# Patient Record
Sex: Male | Born: 1980 | Race: Black or African American | Hispanic: No | Marital: Married | Smoking: Never smoker
Health system: Southern US, Community
[De-identification: ages and names within clinical notes are randomized; demographics above are authoritative.]

## PROBLEM LIST (undated history)

## (undated) DIAGNOSIS — F209 Schizophrenia, unspecified: Secondary | ICD-10-CM

## (undated) DIAGNOSIS — R569 Unspecified convulsions: Secondary | ICD-10-CM

## (undated) DIAGNOSIS — G8929 Other chronic pain: Secondary | ICD-10-CM

## (undated) DIAGNOSIS — F32A Depression, unspecified: Secondary | ICD-10-CM

## (undated) DIAGNOSIS — R42 Dizziness and giddiness: Secondary | ICD-10-CM

## (undated) DIAGNOSIS — F319 Bipolar disorder, unspecified: Secondary | ICD-10-CM

## (undated) DIAGNOSIS — F419 Anxiety disorder, unspecified: Secondary | ICD-10-CM

## (undated) DIAGNOSIS — G4700 Insomnia, unspecified: Secondary | ICD-10-CM

## (undated) DIAGNOSIS — R519 Headache, unspecified: Secondary | ICD-10-CM

## (undated) DIAGNOSIS — Z9289 Personal history of other medical treatment: Secondary | ICD-10-CM

## (undated) DIAGNOSIS — G3184 Mild cognitive impairment, so stated: Secondary | ICD-10-CM

## (undated) DIAGNOSIS — R51 Headache: Secondary | ICD-10-CM

## (undated) DIAGNOSIS — F329 Major depressive disorder, single episode, unspecified: Secondary | ICD-10-CM

## (undated) DIAGNOSIS — F445 Conversion disorder with seizures or convulsions: Secondary | ICD-10-CM

## (undated) HISTORY — DX: Unspecified convulsions: R56.9

## (undated) HISTORY — DX: Insomnia, unspecified: G47.00

## (undated) HISTORY — DX: Anxiety disorder, unspecified: F41.9

## (undated) HISTORY — DX: Schizophrenia, unspecified: F20.9

## (undated) HISTORY — DX: Dizziness and giddiness: R42

---
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
## 2001-12-22 ENCOUNTER — Emergency Department (HOSPITAL_COMMUNITY): Admission: EM | Admit: 2001-12-22 | Discharge: 2001-12-22 | Payer: Self-pay | Admitting: Emergency Medicine

## 2002-01-02 ENCOUNTER — Emergency Department (HOSPITAL_COMMUNITY): Admission: EM | Admit: 2002-01-02 | Discharge: 2002-01-02 | Payer: Self-pay | Admitting: *Deleted

## 2003-02-17 ENCOUNTER — Emergency Department (HOSPITAL_COMMUNITY): Admission: EM | Admit: 2003-02-17 | Discharge: 2003-02-17 | Payer: Self-pay | Admitting: Emergency Medicine

## 2004-11-12 ENCOUNTER — Encounter: Admission: RE | Admit: 2004-11-12 | Discharge: 2004-11-12 | Payer: Self-pay | Admitting: Internal Medicine

## 2006-06-19 ENCOUNTER — Emergency Department (HOSPITAL_COMMUNITY): Admission: EM | Admit: 2006-06-19 | Discharge: 2006-06-19 | Payer: Self-pay | Admitting: Emergency Medicine

## 2006-06-22 ENCOUNTER — Ambulatory Visit (HOSPITAL_COMMUNITY): Admission: RE | Admit: 2006-06-22 | Discharge: 2006-06-22 | Payer: Self-pay | Admitting: Preventative Medicine

## 2007-04-04 ENCOUNTER — Emergency Department (HOSPITAL_COMMUNITY): Admission: EM | Admit: 2007-04-04 | Discharge: 2007-04-04 | Payer: Self-pay | Admitting: Emergency Medicine

## 2008-10-18 ENCOUNTER — Emergency Department (HOSPITAL_COMMUNITY): Admission: EM | Admit: 2008-10-18 | Discharge: 2008-10-18 | Payer: Self-pay | Admitting: Emergency Medicine

## 2010-06-21 ENCOUNTER — Encounter: Payer: Self-pay | Admitting: Internal Medicine

## 2010-08-12 ENCOUNTER — Emergency Department (HOSPITAL_COMMUNITY): Payer: Medicaid Other

## 2010-08-12 ENCOUNTER — Emergency Department (HOSPITAL_COMMUNITY)
Admission: EM | Admit: 2010-08-12 | Discharge: 2010-08-13 | Disposition: A | Discharge status: Home / Self Care | Payer: Medicaid Other | Attending: Emergency Medicine | Admitting: Emergency Medicine

## 2010-08-12 DIAGNOSIS — R05 Cough: Secondary | ICD-10-CM | POA: Insufficient documentation

## 2010-08-12 DIAGNOSIS — R059 Cough, unspecified: Secondary | ICD-10-CM | POA: Insufficient documentation

## 2010-08-12 DIAGNOSIS — J45909 Unspecified asthma, uncomplicated: Secondary | ICD-10-CM | POA: Insufficient documentation

## 2010-08-12 DIAGNOSIS — F3289 Other specified depressive episodes: Secondary | ICD-10-CM | POA: Insufficient documentation

## 2010-08-12 DIAGNOSIS — F329 Major depressive disorder, single episode, unspecified: Secondary | ICD-10-CM | POA: Insufficient documentation

## 2010-08-12 DIAGNOSIS — R071 Chest pain on breathing: Secondary | ICD-10-CM | POA: Insufficient documentation

## 2010-08-13 LAB — CBC
HCT: 42 % (ref 39.0–52.0)
Hemoglobin: 14.9 g/dL (ref 13.0–17.0)
MCHC: 35.5 g/dL (ref 30.0–36.0)
MCV: 86.2 fL (ref 78.0–100.0)
Platelets: 165 10*3/uL (ref 150–400)
RBC: 4.87 MIL/uL (ref 4.22–5.81)
RDW: 12.5 % (ref 11.5–15.5)

## 2010-08-13 LAB — DIFFERENTIAL
Basophils Relative: 1 % (ref 0–1)
Monocytes Absolute: 0.6 10*3/uL (ref 0.1–1.0)
Neutro Abs: 4.9 10*3/uL (ref 1.7–7.7)

## 2010-08-13 LAB — BASIC METABOLIC PANEL
BUN: 13 mg/dL (ref 6–23)
Chloride: 106 mEq/L (ref 96–112)
Glucose, Bld: 106 mg/dL — ABNORMAL HIGH (ref 70–99)

## 2010-08-16 ENCOUNTER — Emergency Department (HOSPITAL_COMMUNITY): Payer: Medicaid Other

## 2010-08-16 ENCOUNTER — Emergency Department (HOSPITAL_COMMUNITY)
Admission: EM | Admit: 2010-08-16 | Discharge: 2010-08-16 | Disposition: A | Discharge status: Home / Self Care | Payer: Medicaid Other | Attending: Emergency Medicine | Admitting: Emergency Medicine

## 2010-08-16 DIAGNOSIS — R059 Cough, unspecified: Secondary | ICD-10-CM | POA: Insufficient documentation

## 2010-08-16 DIAGNOSIS — J45909 Unspecified asthma, uncomplicated: Secondary | ICD-10-CM | POA: Insufficient documentation

## 2010-08-16 DIAGNOSIS — R05 Cough: Secondary | ICD-10-CM | POA: Insufficient documentation

## 2010-09-14 ENCOUNTER — Ambulatory Visit (INDEPENDENT_AMBULATORY_CARE_PROVIDER_SITE_OTHER): Payer: Medicaid Other

## 2010-09-14 ENCOUNTER — Inpatient Hospital Stay (INDEPENDENT_AMBULATORY_CARE_PROVIDER_SITE_OTHER)
Admission: RE | Admit: 2010-09-14 | Discharge: 2010-09-14 | Disposition: A | Discharge status: Home / Self Care | Payer: Medicaid Other | Source: Ambulatory Visit | Attending: Emergency Medicine | Admitting: Emergency Medicine

## 2010-09-14 DIAGNOSIS — S20229A Contusion of unspecified back wall of thorax, initial encounter: Secondary | ICD-10-CM

## 2011-07-21 ENCOUNTER — Encounter (HOSPITAL_COMMUNITY): Payer: Self-pay | Admitting: Emergency Medicine

## 2011-07-21 ENCOUNTER — Emergency Department (HOSPITAL_COMMUNITY)
Admission: EM | Admit: 2011-07-21 | Discharge: 2011-07-21 | Disposition: A | Discharge status: Home / Self Care | Payer: No Typology Code available for payment source | Attending: Emergency Medicine | Admitting: Emergency Medicine

## 2011-07-21 DIAGNOSIS — M549 Dorsalgia, unspecified: Secondary | ICD-10-CM | POA: Insufficient documentation

## 2011-07-21 DIAGNOSIS — Y92410 Unspecified street and highway as the place of occurrence of the external cause: Secondary | ICD-10-CM | POA: Insufficient documentation

## 2011-07-21 MED ORDER — IBUPROFEN 600 MG PO TABS: 600.0000 mg | ORAL_TABLET | Freq: Four times a day (QID) | ORAL | Status: AC | PRN

## 2011-07-21 MED ORDER — DIAZEPAM 5 MG PO TABS: 5.0000 mg | ORAL_TABLET | Freq: Two times a day (BID) | ORAL | Status: DC

## 2011-07-21 MED ORDER — DIAZEPAM 2 MG PO TABS: 2.0000 mg | ORAL_TABLET | Freq: Three times a day (TID) | ORAL | Status: AC | PRN
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
## 2011-07-21 NOTE — ED Provider Notes (Signed)
Medical screening examination/treatment/procedure(s) were performed by non-physician practitioner and as supervising physician I was immediately available for consultation/collaboration. Devoria Albe, MD, Armando Gang   Ward Givens, MD 07/21/11 [phone]
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
## 2011-07-21 NOTE — ED Notes (Signed)
Verbalzed understanding instructions

## 2011-08-17 ENCOUNTER — Encounter (HOSPITAL_COMMUNITY): Payer: Self-pay

## 2011-08-17 ENCOUNTER — Emergency Department (HOSPITAL_COMMUNITY)
Admission: EM | Admit: 2011-08-17 | Discharge: 2011-08-17 | Discharge status: Against Medical Advice | Payer: Medicaid Other | Attending: Emergency Medicine | Admitting: Emergency Medicine

## 2011-08-17 DIAGNOSIS — Z0389 Encounter for observation for other suspected diseases and conditions ruled out: Secondary | ICD-10-CM | POA: Insufficient documentation

## 2011-08-17 HISTORY — DX: Major depressive disorder, single episode, unspecified: F32.9

## 2011-08-17 HISTORY — DX: Depression, unspecified: F32.A

## 2011-08-17 MED ORDER — SODIUM CHLORIDE 0.9 % IV SOLN: INTRAVENOUS | Status: DC
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
## 2011-08-18 ENCOUNTER — Emergency Department (HOSPITAL_COMMUNITY)
Admission: EM | Admit: 2011-08-18 | Discharge: 2011-08-18 | Disposition: A | Discharge status: Home / Self Care | Payer: Medicaid Other | Attending: Emergency Medicine | Admitting: Emergency Medicine

## 2011-08-18 ENCOUNTER — Encounter (HOSPITAL_COMMUNITY): Payer: Self-pay | Admitting: *Deleted

## 2011-08-18 DIAGNOSIS — F329 Major depressive disorder, single episode, unspecified: Secondary | ICD-10-CM

## 2011-08-18 DIAGNOSIS — Z79899 Other long term (current) drug therapy: Secondary | ICD-10-CM | POA: Insufficient documentation

## 2011-08-18 DIAGNOSIS — F3289 Other specified depressive episodes: Secondary | ICD-10-CM | POA: Insufficient documentation

## 2011-08-18 DIAGNOSIS — F32A Depression, unspecified: Secondary | ICD-10-CM

## 2011-08-18 DIAGNOSIS — R45851 Suicidal ideations: Secondary | ICD-10-CM | POA: Insufficient documentation

## 2011-08-18 LAB — RAPID URINE DRUG SCREEN, HOSP PERFORMED
Amphetamines: NOT DETECTED
Benzodiazepines: NOT DETECTED
Cocaine: NOT DETECTED
Opiates: NOT DETECTED
Tetrahydrocannabinol: NOT DETECTED

## 2011-08-18 LAB — CBC
HCT: 45.3 % (ref 39.0–52.0)
Hemoglobin: 16 g/dL (ref 13.0–17.0)
MCH: 30.2 pg (ref 26.0–34.0)
MCHC: 35.3 g/dL (ref 30.0–36.0)
RDW: 12.2 % (ref 11.5–15.5)

## 2011-08-18 LAB — DIFFERENTIAL
Basophils Absolute: 0 10*3/uL (ref 0.0–0.1)
Basophils Relative: 1 % (ref 0–1)
Eosinophils Absolute: 0 10*3/uL (ref 0.0–0.7)
Monocytes Absolute: 0.4 10*3/uL (ref 0.1–1.0)
Monocytes Relative: 7 % (ref 3–12)
Neutro Abs: 3.9 10*3/uL (ref 1.7–7.7)

## 2011-08-18 LAB — COMPREHENSIVE METABOLIC PANEL
Albumin: 4.3 g/dL (ref 3.5–5.2)
BUN: 11 mg/dL (ref 6–23)
Calcium: 9.5 mg/dL (ref 8.4–10.5)
Creatinine, Ser: 0.79 mg/dL (ref 0.50–1.35)
GFR calc Af Amer: >90 mL/min (ref >90)
Glucose, Bld: 94 mg/dL (ref 70–99)
Potassium: 3.9 mEq/L (ref 3.5–5.1)
Total Protein: 7.7 g/dL (ref 6.0–8.3)

## 2011-08-18 LAB — SALICYLATE LEVEL: Salicylate Lvl: <2 mg/dL — ABNORMAL LOW (ref 2.8–20.0)
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
## 2011-08-18 NOTE — ED Notes (Signed)
Also the ACT team is made aware and they spoke with Dr. Juleen China and he will DC him

## 2011-08-18 NOTE — ED Notes (Signed)
Pt has 2 belonging bags 

## 2011-08-18 NOTE — ED Provider Notes (Signed)
History     CSN: [id number]  Arrival date & time 08/18/11  1013   First MD Initiated Contact with Patient 08/18/11 1047      Chief Complaint  Patient presents with  . Medical Clearance  . Suicidal    (Consider location/radiation/quality/duration/timing/severity/associated sxs/prior treatment) HPI Pt here with roommate and is not answering questions verbally. He responds by shaking his head. Pt roommate states that pt told him that he took 4 150mg  Zoloft tabs last night. Denied coingestion. Pt is not currently suicidal. Denies pain.  Past Medical History  Diagnosis Date  . Depression     History reviewed. No pertinent past surgical history.  No family history on file.  History  Substance Use Topics  . Smoking status: Never Smoker   . Smokeless tobacco: Not on file  . Alcohol Use: No      Review of Systems  Constitutional: Negative for fever and chills.  Respiratory: Negative for shortness of breath.   Cardiovascular: Negative for chest pain.  Gastrointestinal: Negative for vomiting, abdominal pain and diarrhea.  Neurological: Negative for weakness, numbness and headaches.    Allergies  Penicillins  Home Medications   Current Outpatient Rx  Name Route Sig Dispense Refill  . DIVALPROEX SODIUM ER 500 MG PO TB24 Oral Take 500 mg by mouth at bedtime.    . SERTRALINE HCL 100 MG PO TABS Oral Take 150 mg by mouth daily. TAKE 1.5 TABLETS BY MOUTH ONCE DAILY      BP 124/76  Pulse 76  Temp(Src) 98.4 F (36.9 C) (Oral)  Resp 20  Ht 6' (1.829 m)  Wt 154 lb (69.854 kg)  BMI 20.89 kg/m2  SpO2 96%  Physical Exam  Nursing note and vitals reviewed. Constitutional: He is oriented to person, place, and time. He appears well-developed and well-nourished. No distress.  HENT:  Head: Normocephalic and atraumatic.  Mouth/Throat: Oropharynx is clear and moist.  Eyes: EOM are normal. Pupils are equal, round, and reactive to light.  Neck: Normal range of motion. Neck  supple.  Cardiovascular: Normal rate and regular rhythm.   Pulmonary/Chest: Effort normal and breath sounds normal. No respiratory distress. He has no wheezes. He has no rales.  Abdominal: Soft. Bowel sounds are normal. He exhibits no mass. There is no tenderness. There is no rebound and no guarding.  Musculoskeletal: Normal range of motion. He exhibits no edema and no tenderness.  Neurological: He is alert and oriented to person, place, and time.       5/5 motor, sensation intact  Skin: Skin is warm and dry. No rash noted. No erythema.    ED Course  Procedures (including critical care time)  Labs Reviewed  COMPREHENSIVE METABOLIC PANEL - Abnormal; Notable for the following:    Total Bilirubin 1.3 (*)    All other components within normal limits  SALICYLATE LEVEL - Abnormal; Notable for the following:    Salicylate Lvl <2.0 (*)    All other components within normal limits  CBC  DIFFERENTIAL  ETHANOL  URINE RAPID DRUG SCREEN (HOSP PERFORMED)  ACETAMINOPHEN LEVEL  LAB REPORT - SCANNED   No results found.   1. Depression       MDM          Loren Racer, MD 08/23/11 [phone]
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
## 2011-08-18 NOTE — ED Notes (Signed)
See notes from visit last pm.

## 2011-08-18 NOTE — Discharge Instructions (Signed)
Depression You have signs of depression. This is a common problem. It can occur at any age. It is often hard to recognize. People can suffer from depression and still have moments of enjoyment. Depression interferes with your basic ability to function in life. It upsets your relationships, sleep, eating, and work habits. CAUSES  Depression is believed to be caused by an imbalance in brain chemicals. It may be triggered by an unpleasant event. Relationship crises, a death in the family, financial worries, retirement, or other stressors are normal causes of depression. Depression may also start for no known reason. Other factors that may play a part include medical illnesses, some medicines, genetics, and alcohol or drug abuse. SYMPTOMS   Feeling unhappy or worthless.   Long-lasting (chronic) tiredness or worn-out feeling.   Self-destructive thoughts and actions.   Not being able to sleep or sleeping too much.   Eating more than usual or not eating at all.   Headaches or feeling anxious.   Trouble concentrating or making decisions.   Unexplained physical problems and substance abuse.  TREATMENT  Depression usually gets better with treatment. This can include:  Antidepressant medicines. It can take weeks before the proper dose is achieved and benefits are reached.   Talking with a therapist, clergyperson, counselor, or friend. These people can help you gain insight into your problem and regain control of your life.   Eating a good diet.   Getting regular physical exercise, such as walking for 30 minutes every day.   Not abusing alcohol or drugs.  Treating depression often takes 6 months or longer. This length of treatment is needed to keep symptoms from returning. Call your caregiver and arrange for follow-up care as suggested. SEEK IMMEDIATE MEDICAL CARE IF:   You start to have thoughts of hurting yourself or others.   Call your local emergency services (911 in [place]   Go to  your local medical emergency department.   Call the National Suicide Prevention Lifeline: 1-800-273-TALK 317-021-8915).  Document Released: 05/17/2005 Document Revised: 05/06/2011 Document Reviewed: 10/17/2009 [facility] Patient Information 2012 [place], [place].Suicidal Feelings, How to Help Yourself Everyone feels sad or unhappy at times, but depressing thoughts and feelings of hopelessness can lead to thoughts of suicide. It can seem as if life is too tough to handle. It is as if the mountain is just too high and your climbing skills are not great enough. At that moment these dark thoughts and feelings may seem overwhelming and never ending. It is important to remember these feelings are temporary! They will go away. If you feel as though you have reached the point where suicide is the only answer, it is time to let someone know immediately. This is the first step to feeling better. The following steps will move you to safer ground and lead you in a positive direction out of depression. HOW TO COPE AND PREVENT SUICIDE  Let family, friends, teachers and/or counselors know. Get help. Try not to isolate yourself from those who care about you. Even though you may not feel sociable or think that you are not good company, talk with someone everyday. It is best if it is face to face. Remember, they will want to help you.   Eat a regularly spaced and well-balanced diet, and get plenty of rest.   Avoid alcohol and drugs because they will only make you feel worse and may also lower your inhibitions. Remove them from the home. If you are thinking of taking an overdose of  your prescribed medications, give your medicines to someone who can give them to you one day at a time. If you are on antidepressants, let your caregiver know of your feelings so he or she can provide a safer medication, if that is a concern.   Remove weapons or poisons from your home.   Try to stick to routines. That may mean just walking  the dog or feeding the cat. Follow a schedule and remind yourself that you have to keep that schedule every day. Play with your pets. If it is possible, and you do not have a pet, get one. They give you a sense of well-being, lower your blood pressure and make your heart feel good. They need you, and we all want to be needed.   Set some realistic goals and achieve them. Make a list and cross things off as you go. Accomplishments give a sense of worth. Wait until you are feeling better before doing things you find difficult or unpleasant to do.   If you are able, try to start exercising. Even half-hour periods of exercise each day will make you feel better. Getting out in the sun or into nature helps you recover from depression faster. If you have a favorite place to walk, take advantage of that.   Increase safe activities that have always given you pleasure. This may include playing your favorite music, reading a good book, painting a picture or playing your favorite instrument. Do whatever takes your mind off your depression and puts a smile on your face.   Keep your living space well lit with windows open, and let the sun shine in. Bright light definitely treats depression, not just people with the seasonal affective disorders (SAD).  Above all else remember, depression is temporary. It will go away. Do not contemplate suicide. Death as a permanent solution is not the answer. Suicide will take away the beautiful rest of life, and do lifelong harm to those around you who love you. Help is available. National Suicide Help Lines with 24 hour help are: 1-800-SUICIDE (380)402-2001 Document Released: 11/21/2002 Document Revised: 05/06/2011 Document Reviewed: 04/11/2007 [facility] Patient Information 2012 [place], [place].  RESOURCE GUIDE  Dental Problems  Patients with Medicaid: [facility] [phone] [address]                                            [phone] [address] Phone:  [phone]                                                  Phone:  [phone]) [phone]  If unable to pay or uninsured, contact:  [facility] or [facility]. to become qualified for the adult dental clinic.  Chronic Pain Problems Contact [facility]  [phone] Patients need to be referred by their primary care doctor.  Insufficient Money for Medicine Contact [employer] Way:  call "211" or [facility] (760)[phone].  No Primary Care Doctor Call [facility]  [phone] Other agencies that provide inexpensive medical care    [facility]  [phone]  [facility]  [phone]    [facility]  [phone]    [facility]  [phone]    [facility]  [phone]    [facility]  [phone]  Psychological Services [facility]  [phone] [facility]  [phone] [facility]   [phone] (emergency services (516)636-5336)  Substance Abuse Resources [facility]  [phone] [facility] [phone] [facility] ([phone] Floydene Flock [phone] Residential & Outpatient Substance Abuse Program  [phone]  Abuse/Neglect [facility] Child Abuse Hotline 308-320-2660 [facility] Child Abuse Hotline 6403019897 (After Hours)  Emergency [facility] ([phone]  M[facility] [place] [facility] [phone] [facility] (850) [phone]  MRSA Hotline #:   (484)114-4034    [facility] Resources  [facility]     [employer]                          [facility] [address] [place]                       [address]      [address]  Blondell Reveal Phone:  [phone]                                   Phone:   [phone]                 Phone:  [phone]  [facility] Phone:  [phone]  [facility] Child Abuse Hotline 501-026-2768 8484622297 (After Hours)

## 2011-08-18 NOTE — ED Notes (Signed)
Pt is not very verbal with nurse pt states that is depressed but will state what he is depressed about

## 2011-08-18 NOTE — Progress Notes (Signed)
Psych ED Behavioral Health Group  Co-facilitated crisis intervention group w/ Burnis Kingfisher, Chaplain, for pt's in the psych ED. Group focused on suicide ideation and dealing w/ stressors and feeling emotionally overwhelmed. Group was open and engaged w/ each member openly sharing and providing universality/support. Members engaged in drawing activity to promote recognition of bodily reactions to stressors and mindfulness activity to practice belly breathing by imagining inflation/deflation of balloon in the belly.  Pt was active and engaged in group. Pt reported he was in psych ED d/t overdose SI attempt. Pt engaged in activity and reported feeling nerves in chest and stomach. Pt was supportive of other members who discussed SI.  Jas Betten B MS, LPCA, NCC

## 2011-08-18 NOTE — ED Notes (Signed)
Pt had telepysh conference with the pshy MD and she stated she was going to let him go her name is Dr. Berlin Hun.  This nurse stayed in the room during the conference

## 2011-08-18 NOTE — BH Assessment (Signed)
Writer spoke with Lurena Joiner at [facility] [phone].  where pt receives med management. Ms Mayford Knife had accompanied pt to [facility] this am. Lonni Fix reports that pt told her he'd taken the 4 Zoloft pills because he was "depressed", however he wouldn't tell her why he was depressed. She stated that pt didn't tell her or EDP much info while Andreana was here. She reports that she isn't aware of any previous suicide attempts by pt.

## 2011-09-22 ENCOUNTER — Encounter (HOSPITAL_COMMUNITY): Payer: Self-pay | Admitting: Emergency Medicine

## 2011-09-22 ENCOUNTER — Emergency Department (HOSPITAL_COMMUNITY)
Admission: EM | Admit: 2011-09-22 | Discharge: 2011-09-22 | Disposition: A | Discharge status: Home / Self Care | Payer: Medicaid Other | Attending: Emergency Medicine | Admitting: Emergency Medicine

## 2011-09-22 DIAGNOSIS — Z79899 Other long term (current) drug therapy: Secondary | ICD-10-CM | POA: Insufficient documentation

## 2011-09-22 DIAGNOSIS — F3289 Other specified depressive episodes: Secondary | ICD-10-CM | POA: Insufficient documentation

## 2011-09-22 DIAGNOSIS — R05 Cough: Secondary | ICD-10-CM | POA: Insufficient documentation

## 2011-09-22 DIAGNOSIS — J029 Acute pharyngitis, unspecified: Secondary | ICD-10-CM

## 2011-09-22 DIAGNOSIS — R070 Pain in throat: Secondary | ICD-10-CM | POA: Insufficient documentation

## 2011-09-22 DIAGNOSIS — R059 Cough, unspecified: Secondary | ICD-10-CM | POA: Insufficient documentation

## 2011-09-22 DIAGNOSIS — F329 Major depressive disorder, single episode, unspecified: Secondary | ICD-10-CM | POA: Insufficient documentation

## 2011-09-22 NOTE — ED Provider Notes (Signed)
History     CSN: [id number]  Arrival date & time 09/22/11  0945   None     Chief Complaint  Patient presents with  . Sore Throat    for 4 days with a slight cough, no ear pain, no runny nose     (Consider location/radiation/quality/duration/timing/severity/associated sxs/prior treatment) Patient is a 31 y.o. male presenting with pharyngitis. The history is provided by the patient. No language interpreter was used.  Sore Throat This is a new problem. The current episode started in the past 7 days. The problem occurs daily. The problem has been gradually worsening. Associated symptoms include coughing and a sore throat. Pertinent negatives include no abdominal pain, chills, congestion, nausea, neck pain or vomiting. The symptoms are aggravated by swallowing. He has tried nothing for the symptoms.  c/o Sore throat for 3 days with no upper respiratory symptoms except a dry cough. Has taken nothing over the counter.  No fever.  No difficulty swallowing.  Will check rapid strep. Lungs clear.  pmh of depression and behavior health admission  Past Medical History  Diagnosis Date  . Depression     History reviewed. No pertinent past surgical history.  No family history on file.  History  Substance Use Topics  . Smoking status: Never Smoker   . Smokeless tobacco: Not on file  . Alcohol Use: No      Review of Systems  Constitutional: Negative.  Negative for chills.  HENT: Positive for sore throat. Negative for ear pain, nosebleeds, congestion, facial swelling, rhinorrhea, trouble swallowing, neck pain, neck stiffness and voice change.   Eyes: Negative.   Respiratory: Positive for cough. Negative for chest tightness, shortness of breath and wheezing.   Cardiovascular: Negative.   Gastrointestinal: Negative.  Negative for nausea, vomiting and abdominal pain.  Neurological: Negative.   Psychiatric/Behavioral: Negative.   All other systems reviewed and are negative.    Allergies    Penicillins  Home Medications   Current Outpatient Rx  Name Route Sig Dispense Refill  . DIVALPROEX SODIUM ER 500 MG PO TB24 Oral Take 1,000 mg by mouth at bedtime.    . SERTRALINE HCL 100 MG PO TABS Oral Take 150 mg by mouth daily.      BP 127/79  Pulse 93  Temp(Src) 98.8 F (37.1 C) (Oral)  Resp 16  SpO2 100%  Physical Exam  Nursing note and vitals reviewed. Constitutional: He is oriented to person, place, and time. He appears well-developed and well-nourished.  HENT:  Head: Normocephalic.  Right Ear: Tympanic membrane normal.  Left Ear: Tympanic membrane normal.  Nose: Nose normal.  Mouth/Throat: Uvula is midline and mucous membranes are normal. Posterior oropharyngeal erythema present. No oropharyngeal exudate, posterior oropharyngeal edema or tonsillar abscesses.  Eyes: Conjunctivae and EOM are normal. Pupils are equal, round, and reactive to light.  Neck: Normal range of motion. Neck supple.  Cardiovascular: Normal rate and regular rhythm.   Pulmonary/Chest: Effort normal and breath sounds normal.  Abdominal: Soft.  Musculoskeletal: Normal range of motion.  Neurological: He is alert and oriented to person, place, and time.  Skin: Skin is warm and dry. No rash noted.  Psychiatric: He has a normal mood and affect.    ED Course  Procedures (including critical care time)  Labs Reviewed - No data to display No results found.   No diagnosis found.    MDM  Recommend claritin or benadryl for symptoms.  Warm saline gargle and ibuprofen or tylenol.  Upper respiratory virus.  Strep test negative.        Remi Haggard, NP 09/22/11 1706

## 2011-09-22 NOTE — Discharge Instructions (Signed)
Franklin [name] you do not have strep throat.  You have an upper respiratory virus or a cold.  Use warm salt water gargle for the pain. He can also take ibuprofen up to 800 mg every 6 hours for the pain. He can take Benadryl at night to help you sleep and Claritin during the day for your upper respiratory symptoms. PCP from loose to followup with if not better. Return to the ER for uncontrolled nausea vomiting diarrhea or high fever.  Pharyngitis, Viral and Bacterial Pharyngitis is soreness (inflammation) or infection of the pharynx. It is also called a sore throat. CAUSES  Most sore throats are caused by viruses and are part of a cold. However, some sore throats are caused by strep and other bacteria. Sore throats can also be caused by post nasal drip from draining sinuses, allergies and sometimes from sleeping with an open mouth. Infectious sore throats can be spread from person to person by coughing, sneezing and sharing cups or eating utensils. TREATMENT  Sore throats that are viral usually last 3-4 days. Viral illness will get better without medications (antibiotics). Strep throat and other bacterial infections will usually begin to get better about 24-48 hours after you begin to take antibiotics. HOME CARE INSTRUCTIONS   If the caregiver feels there is a bacterial infection or if there is a positive strep test, they will prescribe an antibiotic. The full course of antibiotics must be taken. If the full course of antibiotic is not taken, you or your child may become ill again. If you or your child has strep throat and do not finish all of the medication, serious heart or kidney diseases may develop.   Drink enough water and fluids to keep your urine clear or pale yellow.   Only take over-the-counter or prescription medicines for pain, discomfort or fever as directed by your caregiver.   Get lots of rest.   Gargle with salt water ( tsp. of salt in a glass of water) as often as every 1-2 hours as you  need for comfort.   Hard candies may soothe the throat if individual is not at risk for choking. Throat sprays or lozenges may also be used.  SEEK MEDICAL CARE IF:   Large, tender lumps in the neck develop.   A rash develops.   Green, yellow-brown or bloody sputum is coughed up.   Your baby is older than 3 months with a rectal temperature of 100.5 F (38.1 C) or higher for more than 1 day.  SEEK IMMEDIATE MEDICAL CARE IF:   A stiff neck develops.   You or your child are drooling or unable to swallow liquids.   You or your child are vomiting, unable to keep medications or liquids down.   You or your child has severe pain, unrelieved with recommended medications.   You or your child are having difficulty breathing (not due to stuffy nose).   You or your child are unable to fully open your mouth.   You or your child develop redness, swelling, or severe pain anywhere on the neck.   You have a fever.   Your baby is older than 3 months with a rectal temperature of 102 F (38.9 C) or higher.   Your baby is 65 months old or younger with a rectal temperature of 100.4 F (38 C) or higher.  MAKE SURE YOU:   Understand these instructions.   Will watch your condition.   Will get help right away if you are not  doing well or get worse.  Document Released: 05/17/2005 Document Revised: 05/06/2011 Document Reviewed: 08/14/2007 [facility] Patient Information 2012 [place], [place].Pharyngitis, Viral and Bacterial Pharyngitis is soreness (inflammation) or infection of the pharynx. It is also called a sore throat. CAUSES  Most sore throats are caused by viruses and are part of a cold. However, some sore throats are caused by strep and other bacteria. Sore throats can also be caused by post nasal drip from draining sinuses, allergies and sometimes from sleeping with an open mouth. Infectious sore throats can be spread from person to person by coughing, sneezing and sharing cups or eating  utensils. TREATMENT  Sore throats that are viral usually last 3-4 days. Viral illness will get better without medications (antibiotics). Strep throat and other bacterial infections will usually begin to get better about 24-48 hours after you begin to take antibiotics. HOME CARE INSTRUCTIONS   If the caregiver feels there is a bacterial infection or if there is a positive strep test, they will prescribe an antibiotic. The full course of antibiotics must be taken. If the full course of antibiotic is not taken, you or your child may become ill again. If you or your child has strep throat and do not finish all of the medication, serious heart or kidney diseases may develop.   Drink enough water and fluids to keep your urine clear or pale yellow.   Only take over-the-counter or prescription medicines for pain, discomfort or fever as directed by your caregiver.   Get lots of rest.   Gargle with salt water ( tsp. of salt in a glass of water) as often as every 1-2 hours as you need for comfort.   Hard candies may soothe the throat if individual is not at risk for choking. Throat sprays or lozenges may also be used.  SEEK MEDICAL CARE IF:   Large, tender lumps in the neck develop.   A rash develops.   Green, yellow-brown or bloody sputum is coughed up.   Your baby is older than 3 months with a rectal temperature of 100.5 F (38.1 C) or higher for more than 1 day.  SEEK IMMEDIATE MEDICAL CARE IF:   A stiff neck develops.   You or your child are drooling or unable to swallow liquids.   You or your child are vomiting, unable to keep medications or liquids down.   You or your child has severe pain, unrelieved with recommended medications.   You or your child are having difficulty breathing (not due to stuffy nose).   You or your child are unable to fully open your mouth.   You or your child develop redness, swelling, or severe pain anywhere on the neck.   You have a fever.   Your baby  is older than 3 months with a rectal temperature of 102 F (38.9 C) or higher.   Your baby is 34 months old or younger with a rectal temperature of 100.4 F (38 C) or higher.  MAKE SURE YOU:   Understand these instructions.   Will watch your condition.   Will get help right away if you are not doing well or get worse.  Document Released: 05/17/2005 Document Revised: 05/06/2011 Document Reviewed: 08/14/2007 [facility] Patient Information 2012 [place], [place].Pharyngitis, Viral and Bacterial Pharyngitis is soreness (inflammation) or infection of the pharynx. It is also called a sore throat. CAUSES  Most sore throats are caused by viruses and are part of a cold. However, some sore throats are caused by strep  and other bacteria. Sore throats can also be caused by post nasal drip from draining sinuses, allergies and sometimes from sleeping with an open mouth. Infectious sore throats can be spread from person to person by coughing, sneezing and sharing cups or eating utensils. TREATMENT  Sore throats that are viral usually last 3-4 days. Viral illness will get better without medications (antibiotics). Strep throat and other bacterial infections will usually begin to get better about 24-48 hours after you begin to take antibiotics. HOME CARE INSTRUCTIONS   If the caregiver feels there is a bacterial infection or if there is a positive strep test, they will prescribe an antibiotic. The full course of antibiotics must be taken. If the full course of antibiotic is not taken, you or your child may become ill again. If you or your child has strep throat and do not finish all of the medication, serious heart or kidney diseases may develop.   Drink enough water and fluids to keep your urine clear or pale yellow.   Only take over-the-counter or prescription medicines for pain, discomfort or fever as directed by your caregiver.   Get lots of rest.   Gargle with salt water ( tsp. of salt in a glass of  water) as often as every 1-2 hours as you need for comfort.   Hard candies may soothe the throat if individual is not at risk for choking. Throat sprays or lozenges may also be used.  SEEK MEDICAL CARE IF:   Large, tender lumps in the neck develop.   A rash develops.   Green, yellow-brown or bloody sputum is coughed up.   Your baby is older than 3 months with a rectal temperature of 100.5 F (38.1 C) or higher for more than 1 day.  SEEK IMMEDIATE MEDICAL CARE IF:   A stiff neck develops.   You or your child are drooling or unable to swallow liquids.   You or your child are vomiting, unable to keep medications or liquids down.   You or your child has severe pain, unrelieved with recommended medications.   You or your child are having difficulty breathing (not due to stuffy nose).   You or your child are unable to fully open your mouth.   You or your child develop redness, swelling, or severe pain anywhere on the neck.   You have a fever.   Your baby is older than 3 months with a rectal temperature of 102 F (38.9 C) or higher.   Your baby is 76 months old or younger with a rectal temperature of 100.4 F (38 C) or higher.  MAKE SURE YOU:   Understand these instructions.   Will watch your condition.   Will get help right away if you are not doing well or get worse.  Document Released: 05/17/2005 Document Revised: 05/06/2011 Document Reviewed: 08/14/2007 [facility] Patient Information 2012 [place], [place].Antibiotic Nonuse  Your caregiver felt that the infection or problem was not one that would be helped with an antibiotic. Infections may be caused by viruses or bacteria. Only a caregiver can tell which one of these is the likely cause of an illness. A cold is the most common cause of infection in both adults and children. A cold is a virus. Antibiotic treatment will have no effect on a viral infection. Viruses can lead to many lost days of work caring for sick children and  many missed days of school. Children may catch as many as 10 "colds" or "flus" per year during  which they can be tearful, cranky, and uncomfortable. The goal of treating a virus is aimed at keeping the ill person comfortable. Antibiotics are medications used to help the body fight bacterial infections. There are relatively few types of bacteria that cause infections but there are hundreds of viruses. While both viruses and bacteria cause infection they are very different types of germs. A viral infection will typically go away by itself within 7 to 10 days. Bacterial infections may spread or get worse without antibiotic treatment. Examples of bacterial infections are:  Sore throats (like strep throat or tonsillitis).   Infection in the lung (pneumonia).   Ear and skin infections.  Examples of viral infections are:  Colds or flus.   Most coughs and bronchitis.   Sore throats not caused by Strep.   Runny noses.  It is often best not to take an antibiotic when a viral infection is the cause of the problem. Antibiotics can kill off the helpful bacteria that we have inside our body and allow harmful bacteria to start growing. Antibiotics can cause side effects such as allergies, nausea, and diarrhea without helping to improve the symptoms of the viral infection. Additionally, repeated uses of antibiotics can cause bacteria inside of our body to become resistant. That resistance can be passed onto harmful bacterial. The next time you have an infection it may be harder to treat if antibiotics are used when they are not needed. Not treating with antibiotics allows our own immune system to develop and take care of infections more efficiently. Also, antibiotics will work better for [place] when they are prescribed for bacterial infections. Treatments for a child that is ill may include:  Give extra fluids throughout the day to stay hydrated.   Get plenty of rest.   Only give your child over-the-counter or  prescription medicines for pain, discomfort, or fever as directed by your caregiver.   The use of a cool mist humidifier may help stuffy noses.   Cold medications if suggested by your caregiver.  Your caregiver may decide to start you on an antibiotic if:  The problem you were seen for today continues for a longer length of time than expected.   You develop a secondary bacterial infection.  SEEK MEDICAL CARE IF:  Fever lasts longer than 5 days.   Symptoms continue to get worse after 5 to 7 days or become severe.   Difficulty in breathing develops.   Signs of dehydration develop (poor drinking, rare urinating, dark colored urine).   Changes in behavior or worsening tiredness (listlessness or lethargy).  Document Released: 07/26/2001 Document Revised: 05/06/2011 Document Reviewed: 01/22/2009 [facility] Patient Information 2012 [place], [place].Antibiotic Nonuse  Your caregiver felt that the infection or problem was not one that would be helped with an antibiotic. Infections may be caused by viruses or bacteria. Only a caregiver can tell which one of these is the likely cause of an illness. A cold is the most common cause of infection in both adults and children. A cold is a virus. Antibiotic treatment will have no effect on a viral infection. Viruses can lead to many lost days of work caring for sick children and many missed days of school. Children may catch as many as 10 "colds" or "flus" per year during which they can be tearful, cranky, and uncomfortable. The goal of treating a virus is aimed at keeping the ill person comfortable. Antibiotics are medications used to help the body fight bacterial infections. There are relatively few types  of bacteria that cause infections but there are hundreds of viruses. While both viruses and bacteria cause infection they are very different types of germs. A viral infection will typically go away by itself within 7 to 10 days. Bacterial infections may  spread or get worse without antibiotic treatment. Examples of bacterial infections are:  Sore throats (like strep throat or tonsillitis).   Infection in the lung (pneumonia).   Ear and skin infections.  Examples of viral infections are:  Colds or flus.   Most coughs and bronchitis.   Sore throats not caused by Strep.   Runny noses.  It is often best not to take an antibiotic when a viral infection is the cause of the problem. Antibiotics can kill off the helpful bacteria that we have inside our body and allow harmful bacteria to start growing. Antibiotics can cause side effects such as allergies, nausea, and diarrhea without helping to improve the symptoms of the viral infection. Additionally, repeated uses of antibiotics can cause bacteria inside of our body to become resistant. That resistance can be passed onto harmful bacterial. The next time you have an infection it may be harder to treat if antibiotics are used when they are not needed. Not treating with antibiotics allows our own immune system to develop and take care of infections more efficiently. Also, antibiotics will work better for [place] when they are prescribed for bacterial infections. Treatments for a child that is ill may include:  Give extra fluids throughout the day to stay hydrated.   Get plenty of rest.   Only give your child over-the-counter or prescription medicines for pain, discomfort, or fever as directed by your caregiver.   The use of a cool mist humidifier may help stuffy noses.   Cold medications if suggested by your caregiver.  Your caregiver may decide to start you on an antibiotic if:  The problem you were seen for today continues for a longer length of time than expected.   You develop a secondary bacterial infection.  SEEK MEDICAL CARE IF:  Fever lasts longer than 5 days.   Symptoms continue to get worse after 5 to 7 days or become severe.   Difficulty in breathing develops.   Signs of  dehydration develop (poor drinking, rare urinating, dark colored urine).   Changes in behavior or worsening tiredness (listlessness or lethargy).  Document Released: 07/26/2001 Document Revised: 05/06/2011 Document Reviewed: 01/22/2009 [facility] Patient Information 2012 [place], [place].Antibiotic Nonuse  Your caregiver felt that the infection or problem was not one that would be helped with an antibiotic. Infections may be caused by viruses or bacteria. Only a caregiver can tell which one of these is the likely cause of an illness. A cold is the most common cause of infection in both adults and children. A cold is a virus. Antibiotic treatment will have no effect on a viral infection. Viruses can lead to many lost days of work caring for sick children and many missed days of school. Children may catch as many as 10 "colds" or "flus" per year during which they can be tearful, cranky, and uncomfortable. The goal of treating a virus is aimed at keeping the ill person comfortable. Antibiotics are medications used to help the body fight bacterial infections. There are relatively few types of bacteria that cause infections but there are hundreds of viruses. While both viruses and bacteria cause infection they are very different types of germs. A viral infection will typically go away by itself within 7 to  10 days. Bacterial infections may spread or get worse without antibiotic treatment. Examples of bacterial infections are:  Sore throats (like strep throat or tonsillitis).   Infection in the lung (pneumonia).   Ear and skin infections.  Examples of viral infections are:  Colds or flus.   Most coughs and bronchitis.   Sore throats not caused by Strep.   Runny noses.  It is often best not to take an antibiotic when a viral infection is the cause of the problem. Antibiotics can kill off the helpful bacteria that we have inside our body and allow harmful bacteria to start growing. Antibiotics can cause  side effects such as allergies, nausea, and diarrhea without helping to improve the symptoms of the viral infection. Additionally, repeated uses of antibiotics can cause bacteria inside of our body to become resistant. That resistance can be passed onto harmful bacterial. The next time you have an infection it may be harder to treat if antibiotics are used when they are not needed. Not treating with antibiotics allows our own immune system to develop and take care of infections more efficiently. Also, antibiotics will work better for [place] when they are prescribed for bacterial infections. Treatments for a child that is ill may include:  Give extra fluids throughout the day to stay hydrated.   Get plenty of rest.   Only give your child over-the-counter or prescription medicines for pain, discomfort, or fever as directed by your caregiver.   The use of a cool mist humidifier may help stuffy noses.   Cold medications if suggested by your caregiver.  Your caregiver may decide to start you on an antibiotic if:  The problem you were seen for today continues for a longer length of time than expected.   You develop a secondary bacterial infection.  SEEK MEDICAL CARE IF:  Fever lasts longer than 5 days.   Symptoms continue to get worse after 5 to 7 days or become severe.   Difficulty in breathing develops.   Signs of dehydration develop (poor drinking, rare urinating, dark colored urine).   Changes in behavior or worsening tiredness (listlessness or lethargy).  Document Released: 07/26/2001 Document Revised: 05/06/2011 Document Reviewed: 01/22/2009 [facility] Patient Information 2012 [place], [place].Salt Water Gargle This solution will help make your mouth and throat feel better. HOME CARE INSTRUCTIONS   Mix 1 teaspoon of salt in 8 ounces of warm water.   Gargle with this solution as much or often as you need or as directed. Swish and gargle gently if you have any sores or wounds in your mouth.    Do not swallow this mixture.  Document Released: 02/19/2004 Document Revised: 05/06/2011 Document Reviewed: 07/12/2008 [facility] Patient Information 2012 [place], [place].Salt Water Gargle This solution will help make your mouth and throat feel better. HOME CARE INSTRUCTIONS   Mix 1 teaspoon of salt in 8 ounces of warm water.   Gargle with this solution as much or often as you need or as directed. Swish and gargle gently if you have any sores or wounds in your mouth.   Do not swallow this mixture.  Document Released: 02/19/2004 Document Revised: 05/06/2011 Document Reviewed: 07/12/2008 [facility] Patient Information 2012 [place], [place]  .Salt Water Gargle This solution will help make your mouth and throat feel better. HOME CARE INSTRUCTIONS   Mix 1 teaspoon of salt in 8 ounces of warm water.   Gargle with this solution as much or often as you need or as directed. Swish and gargle gently if you  have any sores or wounds in your mouth.   Do not swallow this mixture.  Document Released: 02/19/2004 Document Revised: 05/06/2011 Document Reviewed: 07/12/2008 [facility] Patient Information 2012 [place], [place].  RESOURCE GUIDE  Dental Problems  Patients with Medicaid: [facility] [phone]                                           [phone] [address] Phone:  [phone]                                                  Phone:  [phone]  If unable to pay or uninsured, contact:  [facility] or [facility]. to become qualified for the adult dental clinic.  Chronic Pain Problems Contact [facility]  [phone] Patients need to be referred by their primary care doctor.  Insufficient Money for Medicine Contact [employer]:  call "211" or [facility] [phone].  No Primary Care Doctor Call [facility]  6177222708 Other agencies that provide inexpensive medical care    [facility]  Family [facility]  [phone]    [facility] Internal Medicine  [phone]    [facility]  [phone]    [facility]  [phone]    [facility]  [phone]    [facility]  [phone]  Psychological Services [facility]  6054998687 [facility]  [phone] [facility]   [phone] (emergency services (281)[phone])  Substance Abuse Resources [facility]  2102085823 [facility] ([phone] [phone] [facility] [phone] Floydene Flock [phone] Residential & Outpatient Substance Abuse Program  [phone]  Abuse/Neglect [facility] Child Abuse Hotline 873-032-5580 [facility] Child Abuse Hotline (325)461-5925 (After Hours)  Emergency Shelter [facility] [phone]  M[facility] [place] [facility] 802-355-6551 [facility] [phone]  MRSA Hotline #:   (520)593-8692    [facility] Resources  [facility] [place]     [employer]                          [facility] Dept. [address] [place]                       [address]      [address]  [place]                                                Cristobal Goldmann Phone:  [phone]  Phone:  [phone]                 Phone:  [phone]  [facility] Mental Health Phone:  [phone]  [facility] Child Abuse Hotline [phone] (After Hours)

## 2011-09-24 NOTE — ED Provider Notes (Signed)
Medical screening examination/treatment/procedure(s) were performed by non-physician practitioner and as supervising physician I was immediately available for consultation/collaboration.  Myleah Cavendish T Tyon Cerasoli, MD 09/24/11 1756 

## 2011-11-26 ENCOUNTER — Emergency Department (HOSPITAL_COMMUNITY)
Admission: EM | Admit: 2011-11-26 | Discharge: 2011-11-26 | Disposition: A | Discharge status: Home / Self Care | Payer: Medicaid Other | Source: Home / Self Care | Attending: Emergency Medicine | Admitting: Emergency Medicine

## 2011-11-26 ENCOUNTER — Encounter (HOSPITAL_COMMUNITY): Payer: Self-pay

## 2011-11-26 DIAGNOSIS — M791 Myalgia, unspecified site: Secondary | ICD-10-CM

## 2011-11-26 DIAGNOSIS — W57XXXA Bitten or stung by nonvenomous insect and other nonvenomous arthropods, initial encounter: Secondary | ICD-10-CM

## 2011-11-26 DIAGNOSIS — T148 Other injury of unspecified body region: Secondary | ICD-10-CM

## 2011-11-26 DIAGNOSIS — IMO0001 Reserved for inherently not codable concepts without codable children: Secondary | ICD-10-CM

## 2011-11-26 DIAGNOSIS — T148XXA Other injury of unspecified body region, initial encounter: Secondary | ICD-10-CM

## 2011-11-26 MED ORDER — MUPIROCIN 2 % EX OINT: TOPICAL_OINTMENT | Freq: Three times a day (TID) | CUTANEOUS | Status: AC

## 2011-11-26 MED ORDER — IBUPROFEN 600 MG PO TABS: 600.0000 mg | ORAL_TABLET | Freq: Three times a day (TID) | ORAL | Status: AC

## 2011-11-26 NOTE — Discharge Instructions (Signed)
Use warm compresses on your skin wound 3 times a day to help it heal, then apply the ointment and over the area with a band-aid.  Follow up with your doctor if it not getting better or if it gets worse.   Musculoskeletal Pain Musculoskeletal pain is muscle and boney aches and pains. These pains can occur in any part of the body. Your caregiver may treat you without knowing the cause of the pain. They may treat you if blood or urine tests, X-rays, and other tests were normal.  CAUSES There is often not a definite cause or reason for these pains. These pains may be caused by a type of germ (virus). The discomfort may also come from overuse. Overuse includes working out too hard when your body is not fit. Boney aches also come from weather changes. Bone is sensitive to atmospheric pressure changes. HOME CARE INSTRUCTIONS   Ask when your test results will be ready. Make sure you get your test results.   Only take over-the-counter or prescription medicines for pain, discomfort, or fever as directed by your caregiver. If you were given medications for your condition, do not drive, operate machinery or power tools, or sign legal documents for 24 hours. Do not drink alcohol. Do not take sleeping pills or other medications that may interfere with treatment.   Continue all activities unless the activities cause more pain. When the pain lessens, slowly resume normal activities. Gradually increase the intensity and duration of the activities or exercise.   During periods of severe pain, bed rest may be helpful. Lay or sit in any position that is comfortable.   Putting ice on the injured area.   Put ice in a bag.   Place a towel between your skin and the bag.   Leave the ice on for 15 to 20 minutes, 3 to 4 times a day.   Follow up with your caregiver for continued problems and no reason can be found for the pain. If the pain becomes worse or does not go away, it may be necessary to repeat tests or do  additional testing. Your caregiver may need to look further for a possible cause.  SEEK IMMEDIATE MEDICAL CARE IF:  You have pain that is getting worse and is not relieved by medications.   You develop chest pain that is associated with shortness or breath, sweating, feeling sick to your stomach (nauseous), or throw up (vomit).   Your pain becomes localized to the abdomen.   You develop any new symptoms that seem different or that concern you.  MAKE SURE YOU:   Understand these instructions.   Will watch your condition.   Will get help right away if you are not doing well or get worse.  Document Released: 05/17/2005 Document Revised: 05/06/2011 Document Reviewed: 01/05/2008 [facility] Patient Information 2012 [place], [place].

## 2011-11-26 NOTE — ED Provider Notes (Signed)
Medical screening examination/treatment/procedure(s) were performed by non-physician practitioner and as supervising physician I was immediately available for consultation/collaboration.  Leslee Home, M.D.   Reuben Likes, MD 11/26/11 [phone]

## 2011-11-26 NOTE — ED Notes (Signed)
C/o pain in center of mid back for 3 days.  Denies injury, strenuous activity or urinary sx.  Also c/o knot to rt thigh that is painful and itching- no drainage.

## 2011-11-26 NOTE — ED Provider Notes (Signed)
History     CSN: [id number]  Arrival date & time 11/26/11  1326   First MD Initiated Contact with Patient 11/26/11 1518      Chief Complaint  Patient presents with  . Back Pain  . Cyst    (Consider location/radiation/quality/duration/timing/severity/associated sxs/prior treatment) HPI Comments: Reports gets this pain in his back at least once a month, and it lasts about a week before it goes away.  Has had it for years.  Nothing different about this episode.  Yesterday took one of sister's percocets and "it knocked the pain right out".  REquests pain medicine.  Also noticed red spot on thigh yesterday.  Is mildy tender.  Covered with a band-aid.  No drainage from skin.   Patient is a 31 y.o. male presenting with back pain. The history is provided by the patient.  Back Pain  This is a recurrent problem. The current episode started 2 days ago. The problem occurs constantly. The problem has not changed since onset.The pain is associated with no known injury. The pain is present in the thoracic spine. The quality of the pain is described as aching. The pain does not radiate. The pain is moderate. The pain is the same all the time. Pertinent negatives include no chest pain, no fever, no numbness, no abdominal pain, no dysuria, no leg pain, no paresthesias, no tingling and no weakness. Treatments tried: took English as a second language teacher. The treatment provided significant relief.    Past Medical History  Diagnosis Date  . Depression     History reviewed. No pertinent past surgical history.  History reviewed. No pertinent family history.  History  Substance Use Topics  . Smoking status: Never Smoker   . Smokeless tobacco: Not on file  . Alcohol Use: No      Review of Systems  Constitutional: Negative for fever and chills.  Cardiovascular: Negative for chest pain.  Gastrointestinal: Negative for abdominal pain.  Genitourinary: Negative for dysuria.  Musculoskeletal: Positive for back pain.   Skin: Positive for color change.  Neurological: Negative for tingling, weakness, numbness and paresthesias.    Allergies  Penicillins  Home Medications   Current Outpatient Rx  Name Route Sig Dispense Refill  . DIVALPROEX SODIUM ER 500 MG PO TB24 Oral Take 1,000 mg by mouth at bedtime.    . IBUPROFEN 600 MG PO TABS Oral Take 1 tablet (600 mg total) by mouth 3 (three) times daily. 21 tablet 0  . MUPIROCIN 2 % EX OINT Topical Apply topically 3 (three) times daily. 22 g 0  . SERTRALINE HCL 100 MG PO TABS Oral Take 150 mg by mouth daily.      BP 131/78  Pulse 76  Temp 98 F (36.7 C) (Oral)  Resp 16  SpO2 100%  Physical Exam  Constitutional: He appears well-developed and well-nourished. No distress.  Pulmonary/Chest: Effort normal.  Musculoskeletal:       Cervical back: Normal.       Thoracic back: He exhibits tenderness. He exhibits normal range of motion, no bony tenderness, no edema, no deformity and no spasm.       Lumbar back: Normal.       Back:  Neurological: No sensory deficit. Gait normal.  Skin: Skin is warm and dry. Rash noted.       Small 3mm lesion/open area on R lateral upper thigh, c/w insect bite, with mild erythema of surrounding area.  No warmth, mildly tender to palp, no drainage seen or pus pocket palpated.  ED Course  Procedures (including critical care time)  Labs Reviewed - No data to display No results found.   1. Muscle soreness   2. Insect bite       MDM  Had x-ray of thoracic spine in April 2012.  Given pt denies injury and reports pain sx are same as has had for years, no imaging ordered.         Cathlyn Parsons, NP 11/26/11 1527

## 2011-12-20 ENCOUNTER — Encounter (HOSPITAL_COMMUNITY): Payer: Self-pay | Admitting: *Deleted

## 2011-12-20 ENCOUNTER — Emergency Department (HOSPITAL_COMMUNITY)
Admission: EM | Admit: 2011-12-20 | Discharge: 2011-12-20 | Disposition: A | Discharge status: Home / Self Care | Payer: Medicaid Other | Attending: Emergency Medicine | Admitting: Emergency Medicine

## 2011-12-20 DIAGNOSIS — D229 Melanocytic nevi, unspecified: Secondary | ICD-10-CM

## 2011-12-20 DIAGNOSIS — M79606 Pain in leg, unspecified: Secondary | ICD-10-CM

## 2011-12-20 DIAGNOSIS — Z880 Allergy status to penicillin: Secondary | ICD-10-CM | POA: Insufficient documentation

## 2011-12-20 DIAGNOSIS — D2370 Other benign neoplasm of skin of unspecified lower limb, including hip: Secondary | ICD-10-CM | POA: Insufficient documentation

## 2011-12-20 DIAGNOSIS — F329 Major depressive disorder, single episode, unspecified: Secondary | ICD-10-CM | POA: Insufficient documentation

## 2011-12-20 DIAGNOSIS — F3289 Other specified depressive episodes: Secondary | ICD-10-CM | POA: Insufficient documentation

## 2011-12-20 MED ORDER — CYCLOBENZAPRINE HCL 10 MG PO TABS: 10.0000 mg | ORAL_TABLET | Freq: Two times a day (BID) | ORAL | Status: AC | PRN

## 2011-12-20 NOTE — ED Notes (Signed)
PA at bedside.

## 2011-12-20 NOTE — ED Provider Notes (Signed)
History     CSN: [id number]  Arrival date & time 12/20/11  4098   First MD Initiated Contact with Patient 12/20/11 0932      Chief Complaint  Patient presents with  . Leg Pain    (Consider location/radiation/quality/duration/timing/severity/associated sxs/prior treatment) HPI Comments: 31 y/o male with a "spot on left leg and back" x 1 week causing sharp intermittent pain. Nothing in specific exacerbates the pain. Sister gave him a percocet which "knocked the pain right out". He has not tried to take anything else. States the spot on his leg just appeared 1 week ago and is itchy, but then said he was seen here for the spot and given an anti-itch cream 2 weeks ago which does not help. He has been constantly scratching it. The spot on his back does not itch, and he says it is "somewhere on the top of his back and is black". Denies any low back pain, bowel or bladder dysfunction, saddle anesthesia, numbness/tingling down extremities, fever, rashes.   Patient is a 31 y.o. male presenting with leg pain. The history is provided by the patient.  Leg Pain  Pertinent negatives include no numbness.    Past Medical History  Diagnosis Date  . Depression     History reviewed. No pertinent past surgical history.  No family history on file.  History  Substance Use Topics  . Smoking status: Never Smoker   . Smokeless tobacco: Not on file  . Alcohol Use: No      Review of Systems  Constitutional: Negative for fever and chills.  Respiratory: Negative for shortness of breath.   Cardiovascular: Negative for chest pain.  Gastrointestinal:       Denies any bowel dysfunction.  Genitourinary:       Denies any bladder dysfunction.  Musculoskeletal: Positive for back pain. Negative for gait problem.       Positive right leg pain  Skin:       "spot" on his left leg and back  Neurological: Negative for numbness.    Allergies  Penicillins  Home Medications  No current outpatient  prescriptions on file.  BP 129/80  Pulse 61  Temp 98 F (36.7 C) (Oral)  Resp 16  SpO2 100%  Physical Exam  Constitutional: He is oriented to person, place, and time. He appears well-developed and well-nourished. No distress.  HENT:  Head: Normocephalic and atraumatic.  Eyes: Conjunctivae are normal.  Cardiovascular: Normal rate, regular rhythm, normal heart sounds and intact distal pulses.   Pulmonary/Chest: Effort normal and breath sounds normal.  Abdominal: Soft. Bowel sounds are normal.  Musculoskeletal:       Full active left leg ROM without pain. TTP over spot on anterior aspect of left leg. No spinal bony tenderness. Full ROM of spine without pain. Normal gait.   Neurological: He is alert and oriented to person, place, and time. He has normal reflexes. No sensory deficit.  Skin: Skin is warm.       6mm diameter raised pink wheel on anterior aspect of left leg consistent with mosqiuto bite. No surrounding erythema or edema. No evidence of secondary infection. Raised nevus with irregular boarder on right scapular region.  Psychiatric: His affect is blunt. His speech is tangential.    ED Course  Procedures (including critical care time)  Labs Reviewed - No data to display No results found.   1. Leg pain   2. Nevus       MDM  31 y/o male with "spot"  on leg and back x 1 week even though he was seen for this about 2 weeks ago and given anti-inflammatories and anti histamine. Spot on back consistent with irregular nevus and suggest f/u with dermatology. Spot on leg consistent with mosquito bite. Will give muscle relaxer and have him continue with anti-inflammatory. Apply cool compress. Discussed case with Dr. Lynelle Doctor who agrees patient is ok for discharge. No red flags concerning patient's back pain. No s/s of central cord compression or cauda equina. Lower extremities are neurovascularly intact and patient is ambulating without difficulty.         Trevor Mace,  PA-C 12/20/11 1005

## 2011-12-20 NOTE — ED Provider Notes (Signed)
Medical screening examination/treatment/procedure(s) were performed by non-physician practitioner and as supervising physician I was immediately available for consultation/collaboration. Delmer Kowalski, MD, FACEP   Nusaiba Guallpa L Tristina Sahagian, MD 12/20/11 1100 

## 2012-02-04 ENCOUNTER — Encounter (HOSPITAL_COMMUNITY): Payer: Self-pay

## 2012-02-04 ENCOUNTER — Emergency Department (HOSPITAL_COMMUNITY)
Admission: EM | Admit: 2012-02-04 | Discharge: 2012-02-04 | Disposition: A | Discharge status: Home / Self Care | Payer: Medicaid Other | Attending: Emergency Medicine | Admitting: Emergency Medicine

## 2012-02-04 ENCOUNTER — Emergency Department (HOSPITAL_COMMUNITY): Payer: Medicaid Other

## 2012-02-04 DIAGNOSIS — R059 Cough, unspecified: Secondary | ICD-10-CM | POA: Insufficient documentation

## 2012-02-04 DIAGNOSIS — J069 Acute upper respiratory infection, unspecified: Secondary | ICD-10-CM

## 2012-02-04 DIAGNOSIS — Z87891 Personal history of nicotine dependence: Secondary | ICD-10-CM | POA: Insufficient documentation

## 2012-02-04 DIAGNOSIS — R05 Cough: Secondary | ICD-10-CM | POA: Insufficient documentation

## 2012-02-04 MED ORDER — BENZONATATE 100 MG PO CAPS: 100.0000 mg | ORAL_CAPSULE | Freq: Three times a day (TID) | ORAL | Status: AC

## 2012-02-04 NOTE — ED Provider Notes (Signed)
History     CSN: [id number]  Arrival date & time 02/04/12  1521   First MD Initiated Contact with Patient 02/04/12 1734      Chief Complaint  Patient presents with  . Cough    (Consider location/radiation/quality/duration/timing/severity/associated sxs/prior treatment) Patient is a 31 y.o. male presenting with cough. The history is provided by the patient. No language interpreter was used.  Cough This is a new problem. The current episode started more than 2 days ago. The problem occurs every few minutes. The problem has been gradually worsening. The cough is non-productive. Associated symptoms include chest pain and rhinorrhea. He has tried decongestants and cough syrup for the symptoms. The treatment provided no relief. He is not a smoker.  Subjective fever-chills, feeling hot and breaking out in a sweat  Past Medical History  Diagnosis Date  . Depression     No past surgical history on file.  No family history on file.  History  Substance Use Topics  . Smoking status: Former Games developer  . Smokeless tobacco: Not on file  . Alcohol Use: No      Review of Systems  Constitutional: Positive for fever.  HENT: Positive for rhinorrhea.   Respiratory: Positive for cough.   Cardiovascular: Positive for chest pain.  All other systems reviewed and are negative.    Allergies  Penicillins  Home Medications   Current Outpatient Rx  Name Route Sig Dispense Refill  . DEXTROMETHORPHAN-GUAIFENESIN 10-100 MG/5ML PO LIQD Oral Take 10 mLs by mouth every 4 (four) hours as needed. Cough      BP 134/82  Pulse 72  Temp 98.8 F (37.1 C) (Oral)  Resp 16  Ht 6' (1.829 m)  SpO2 98%  Physical Exam  Nursing note and vitals reviewed. Constitutional: He is oriented to person, place, and time. He appears well-developed and well-nourished.  HENT:  Head: Normocephalic.  Eyes: Conjunctivae are normal. Pupils are equal, round, and reactive to light.  Neck: Normal range of motion.    Cardiovascular: Normal rate, regular rhythm and normal heart sounds.   Pulmonary/Chest: Effort normal. He has decreased breath sounds in the right upper field, the right middle field, the right lower field, the left upper field, the left middle field and the left lower field.  Abdominal: Soft. Bowel sounds are normal.  Musculoskeletal: Normal range of motion.  Lymphadenopathy:    He has no cervical adenopathy.  Neurological: He is alert and oriented to person, place, and time.  Skin: Skin is warm and dry. No rash noted.  Psychiatric: He has a normal mood and affect. His behavior is normal. Judgment and thought content normal.    ED Course  Procedures (including critical care time)  Labs Reviewed - No data to display No results found.   No diagnosis found.  CXR results reviewed and discussed with patient and with Dr. Rhunette Croft  MDM  No acute findings on Chest film.  Likely viral URI.        Jimmye Norman, NP 02/04/12 [phone]

## 2012-02-04 NOTE — ED Notes (Signed)
Non productive cough x 4 days.  States when he coughs his chest hurts.

## 2012-02-05 NOTE — ED Provider Notes (Signed)
Medical screening examination/treatment/procedure(s) were performed by non-physician practitioner and as supervising physician I was immediately available for consultation/collaboration.  Derwood Kaplan, MD 02/05/12 [phone]

## 2012-05-17 ENCOUNTER — Emergency Department (HOSPITAL_COMMUNITY)
Admission: EM | Admit: 2012-05-17 | Discharge: 2012-05-17 | Disposition: A | Discharge status: Home / Self Care | Payer: Medicaid Other | Attending: Emergency Medicine | Admitting: Emergency Medicine

## 2012-05-17 ENCOUNTER — Encounter (HOSPITAL_COMMUNITY): Payer: Self-pay | Admitting: Emergency Medicine

## 2012-05-17 DIAGNOSIS — K921 Melena: Secondary | ICD-10-CM | POA: Insufficient documentation

## 2012-05-17 DIAGNOSIS — Z8659 Personal history of other mental and behavioral disorders: Secondary | ICD-10-CM | POA: Insufficient documentation

## 2012-05-17 DIAGNOSIS — Z87891 Personal history of nicotine dependence: Secondary | ICD-10-CM | POA: Insufficient documentation

## 2012-05-17 LAB — POCT I-STAT, CHEM 8
BUN: 10 mg/dL (ref 6–23)
Chloride: 105 mEq/L (ref 96–112)
Creatinine, Ser: 0.9 mg/dL (ref 0.50–1.35)
Glucose, Bld: 83 mg/dL (ref 70–99)
Hemoglobin: 15 g/dL (ref 13.0–17.0)
Potassium: 3.9 mEq/L (ref 3.5–5.1)
Sodium: 144 mEq/L (ref 135–145)

## 2012-05-17 MED ORDER — PSYLLIUM 28 % PO PACK: 1.0000 | PACK | Freq: Every day | ORAL | Status: DC

## 2012-05-17 NOTE — ED Provider Notes (Signed)
History     CSN: [id number]  Arrival date & time 05/17/12  1313   First MD Initiated Contact with Patient 05/17/12 1349      Chief Complaint  Patient presents with  . Rectal Bleeding    (Consider location/radiation/quality/duration/timing/severity/associated sxs/prior treatment) The history is provided by the patient.   31yo M presents after seeing a small amount of blood in his stool yesterday with a bowel movement x1. He had another bowel movement this morning with no blood. He denies any constipation, diarrhea, abdominal pain, or hemorrhoids. He denies any episodes or rectal penetration or previous episodes of bleeding.  Past Medical History  Diagnosis Date  . Depression     History reviewed. No pertinent past surgical history.  No family history on file.  History  Substance Use Topics  . Smoking status: Former Games developer  . Smokeless tobacco: Not on file  . Alcohol Use: No      Review of Systems  All other systems reviewed and are negative.    Allergies  Penicillins  Home Medications   Current Outpatient Rx  Name  Route  Sig  Dispense  Refill  . PSYLLIUM 28 % PO PACK   Oral   Take 1 packet by mouth daily.   30 packet   6     BP 148/76  Pulse 63  Temp 97.6 F (36.4 C) (Oral)  Resp 20  SpO2 98%  Physical Exam  Constitutional: He is oriented to person, place, and time. He appears well-developed and well-nourished.  HENT:  Head: Normocephalic and atraumatic.  Eyes: EOM are normal. Pupils are equal, round, and reactive to light.  Cardiovascular: Normal rate and regular rhythm.   Pulmonary/Chest: Effort normal and breath sounds normal.  Abdominal: Soft. Bowel sounds are normal. He exhibits no distension and no mass. There is no tenderness. There is no rebound and no guarding.  Genitourinary: Guaiac positive stool (With trace blood- no gross blood on DRE).       No hemorrhoids visualized. No massed palpated.  Musculoskeletal: He exhibits no edema  and no tenderness.  Neurological: He is alert and oriented to person, place, and time.    ED Course  Procedures (including critical care time)  Labs Reviewed  OCCULT BLOOD, POC DEVICE - Abnormal; Notable for the following:    Fecal Occult Bld POSITIVE (*)     All other components within normal limits  POCT I-STAT, CHEM 8 - Abnormal; Notable for the following:    Calcium, Ion 1.25 (*)     All other components within normal limits   No results found.   1. Blood in stool     Hemoccult with trace blood. H&H normal.   MDM    31yo M with a small amount of blood per rectum x1 yesterday. No previous episodes and none since.   Will d/c home with Metamucil and recommendations for a high fiber diet. He is to follow up with his PCP or return to the ED for further evaluation if the bleeding returns.      Genelle Gather, MD 05/17/12 [phone]

## 2012-05-17 NOTE — ED Provider Notes (Signed)
I saw and evaluated the patient, reviewed the resident's note and I agree with the findings and plan.   .Face to face Exam:  General:  Awake HEENT:  Atraumatic Resp:  Normal effort Abd:  Nondistended Neuro:No focal weakness Lymph: No adenopathy   Nelia Shi, MD 05/17/12 1524

## 2012-05-17 NOTE — ED Notes (Signed)
Pt presenting to ed with c/o rectal bleeding yesterday. Pt states last normal bowel movement this morning with no blood noted. Pt states he didn't have a ride so he couldn't come in to be seen yesterday. Pt denies hematuria at this time. Pt states he only saw a little blood in his stool.

## 2012-06-20 ENCOUNTER — Encounter (HOSPITAL_COMMUNITY): Payer: Self-pay | Admitting: *Deleted

## 2012-06-20 ENCOUNTER — Emergency Department (HOSPITAL_COMMUNITY)
Admission: EM | Admit: 2012-06-20 | Discharge: 2012-06-21 | Disposition: A | Discharge status: Home / Self Care | Payer: Medicaid Other | Attending: Emergency Medicine | Admitting: Emergency Medicine

## 2012-06-20 DIAGNOSIS — Z87891 Personal history of nicotine dependence: Secondary | ICD-10-CM | POA: Insufficient documentation

## 2012-06-20 DIAGNOSIS — R059 Cough, unspecified: Secondary | ICD-10-CM | POA: Insufficient documentation

## 2012-06-20 DIAGNOSIS — R112 Nausea with vomiting, unspecified: Secondary | ICD-10-CM

## 2012-06-20 DIAGNOSIS — F3289 Other specified depressive episodes: Secondary | ICD-10-CM | POA: Insufficient documentation

## 2012-06-20 DIAGNOSIS — R05 Cough: Secondary | ICD-10-CM | POA: Insufficient documentation

## 2012-06-20 DIAGNOSIS — B9789 Other viral agents as the cause of diseases classified elsewhere: Secondary | ICD-10-CM | POA: Insufficient documentation

## 2012-06-20 DIAGNOSIS — F329 Major depressive disorder, single episode, unspecified: Secondary | ICD-10-CM | POA: Insufficient documentation

## 2012-06-20 DIAGNOSIS — B349 Viral infection, unspecified: Secondary | ICD-10-CM

## 2012-06-20 DIAGNOSIS — Z79899 Other long term (current) drug therapy: Secondary | ICD-10-CM | POA: Insufficient documentation

## 2012-06-20 LAB — COMPREHENSIVE METABOLIC PANEL
Alkaline Phosphatase: 61 U/L (ref 39–117)
BUN: 10 mg/dL (ref 6–23)
Chloride: 102 mEq/L (ref 96–112)
Creatinine, Ser: 0.87 mg/dL (ref 0.50–1.35)
GFR calc Af Amer: >90 mL/min (ref >90)
Glucose, Bld: 99 mg/dL (ref 70–99)
Potassium: 3.6 mEq/L (ref 3.5–5.1)
Total Bilirubin: 1.5 mg/dL — ABNORMAL HIGH (ref 0.3–1.2)
Total Protein: 7.5 g/dL (ref 6.0–8.3)

## 2012-06-20 LAB — CBC WITH DIFFERENTIAL/PLATELET
Eosinophils Absolute: 0 10*3/uL (ref 0.0–0.7)
HCT: 41.9 % (ref 39.0–52.0)
Hemoglobin: 15 g/dL (ref 13.0–17.0)
Lymphs Abs: 0.6 10*3/uL — ABNORMAL LOW (ref 0.7–4.0)
MCH: 30.5 pg (ref 26.0–34.0)
Monocytes Absolute: 0.5 10*3/uL (ref 0.1–1.0)
Monocytes Relative: 5 % (ref 3–12)
Neutrophils Relative %: 89 % — ABNORMAL HIGH (ref 43–77)
RBC: 4.92 MIL/uL (ref 4.22–5.81)

## 2012-06-20 MED ORDER — ONDANSETRON 4 MG PO TBDP
8.0000 mg | ORAL_TABLET | Freq: Once | ORAL | Status: AC
  Administered 2012-06-20: 8 mg via ORAL
  Filled 2012-06-20: qty 2

## 2012-06-20 NOTE — ED Notes (Signed)
Pt states that he has been nauseated all day. Pt states that he vomited once prior to triage. Pt states that he stopped his abilify due to being shakey. Pt also states diarrhea.

## 2012-06-20 NOTE — ED Provider Notes (Signed)
History     CSN: [id number]  Arrival date & time 06/20/12  2056   First MD Initiated Contact with Patient 06/20/12 2246      Chief Complaint  Patient presents with  . Nausea  . Emesis    (Consider location/radiation/quality/duration/timing/severity/associated sxs/prior treatment)  HPIDwayne T Steyer is a 32 y.o. male who is concerned that he is having an adverse medication side effects-he just started his Abilify and Prozac about 6 days ago, today he developed some soreness in the mid abdomen in the periumbilical region, it is 7/10 associated with some nausea and vomiting, no hematemesis, no diarrhea, no dysuria or frequency, no history of kidney stones, no rash, no body aches/myalgias. Patient does endorse some sore throat no cough. Patient denies any alcohol abuse.   He says he's had some mild shortness of breath has a history of asthma, he says a nonproductive cough today and has been shaking and feeling cold.   Past Medical History  Diagnosis Date  . Depression     History reviewed. No pertinent past surgical history.  History reviewed. No pertinent family history.  History  Substance Use Topics  . Smoking status: Former Games developer  . Smokeless tobacco: Not on file  . Alcohol Use: No      Review of Systems At least 10pt or greater review of systems completed and are negative except where specified in the HPI.  Allergies  Penicillins  Home Medications   Current Outpatient Rx  Name  Route  Sig  Dispense  Refill  . ARIPIPRAZOLE 5 MG PO TABS   Oral   Take 5 mg by mouth daily.         Marland Kitchen FLUOXETINE HCL 20 MG PO CAPS   Oral   Take 20 mg by mouth daily.           BP 137/90  Pulse 94  Temp 99.1 F (37.3 C) (Oral)  Resp 18  SpO2 100%  Physical Exam  Nursing notes reviewed.  Electronic medical record reviewed. VITAL SIGNS:   Filed Vitals:   06/20/12 2059  BP: 137/90  Pulse: 94  Temp: 99.1 F (37.3 C)  TempSrc: Oral  Resp: 18  SpO2: 100%    CONSTITUTIONAL: Awake, oriented, appears non-toxic HENT: Atraumatic, normocephalic, oral mucosa pink and moist, airway patent. Poor dentition, mild erythema in throat with nasal drainage seen on the posterior tongue, nasal turbinates are inflamed with some clear nasal discharge.  External ears normal. EYES: Conjunctiva clear, EOMI, PERRLA NECK: Trachea midline, non-tender, supple CARDIOVASCULAR: Normal heart rate, Normal rhythm, No murmurs, rubs, gallops PULMONARY/CHEST: Clear to auscultation, no rhonchi, wheezes, or rales. Symmetrical breath sounds. Non-tender. ABDOMINAL: Non-distended, soft, non-tender - no rebound or guarding.  BS normal. NEUROLOGIC: Non-focal, moving all four extremities, no gross sensory or motor deficits. EXTREMITIES: No clubbing, cyanosis, or edema SKIN: Warm, Dry, No erythema, No rash  ED Course  Procedures (including critical care time)  Labs Reviewed  CBC WITH DIFFERENTIAL - Abnormal; Notable for the following:    Neutrophils Relative 89 (*)     Neutro Abs 8.9 (*)     Lymphocytes Relative 6 (*)     Lymphs Abs 0.6 (*)     All other components within normal limits  COMPREHENSIVE METABOLIC PANEL - Abnormal; Notable for the following:    Total Bilirubin 1.5 (*)     All other components within normal limits   No results found.   1. Viral syndrome   2. Nausea and vomiting  MDM  TOBIAH CELESTINE is a 32 y.o. male presenting with some nausea, sore throat, rhinorrhea-viral syndrome. He is afebrile and nontoxic in appearance. He does have some diffuse abdominal pain is periumbilical-his abdominal exam is benign, with periumbilical pain and nothing this represents a intra-abdominal emergency, labs are unremarkable. Patient is feeling better has not vomited his being in the ER and is tolerating fluids. We'll discharge patient home stable and good condition with Zofran and cough medicine as requested by patient.  I explained the diagnosis and have given  explicit precautions to return to the ER including any other new or worsening symptoms. The patient understands and accepts the medical plan as it's been dictated and I have answered their questions. Discharge instructions concerning home care and prescriptions have been given.  The patient is STABLE and is discharged to home in good condition.         Jones Skene, MD 06/21/12 9604

## 2012-06-21 MED ORDER — HYDROCOD POLST-CHLORPHEN POLST 10-8 MG/5ML PO LQCR: 5.0000 mL | Freq: Two times a day (BID) | ORAL | Status: DC

## 2012-06-21 MED ORDER — ONDANSETRON 4 MG PO TBDP: 4.0000 mg | ORAL_TABLET | Freq: Three times a day (TID) | ORAL | Status: DC | PRN

## 2012-06-21 NOTE — ED Notes (Signed)
Pt dc to home.  Pt states understanding to dc instructions.  Pt ambulatory to exit without difficulty.  Denies need for w/c. 

## 2012-06-21 NOTE — ED Notes (Signed)
Pt given sprite. Tolerated well.

## 2013-02-16 ENCOUNTER — Encounter (HOSPITAL_COMMUNITY): Payer: Self-pay

## 2013-02-16 ENCOUNTER — Emergency Department (HOSPITAL_COMMUNITY)
Admission: EM | Admit: 2013-02-16 | Discharge: 2013-02-16 | Disposition: A | Discharge status: Home / Self Care | Payer: Medicaid Other | Attending: Emergency Medicine | Admitting: Emergency Medicine

## 2013-02-16 DIAGNOSIS — Y929 Unspecified place or not applicable: Secondary | ICD-10-CM | POA: Insufficient documentation

## 2013-02-16 DIAGNOSIS — S51809A Unspecified open wound of unspecified forearm, initial encounter: Secondary | ICD-10-CM | POA: Insufficient documentation

## 2013-02-16 DIAGNOSIS — Z87891 Personal history of nicotine dependence: Secondary | ICD-10-CM | POA: Insufficient documentation

## 2013-02-16 DIAGNOSIS — W5501XA Bitten by cat, initial encounter: Secondary | ICD-10-CM

## 2013-02-16 DIAGNOSIS — Z880 Allergy status to penicillin: Secondary | ICD-10-CM | POA: Insufficient documentation

## 2013-02-16 DIAGNOSIS — IMO0001 Reserved for inherently not codable concepts without codable children: Secondary | ICD-10-CM | POA: Insufficient documentation

## 2013-02-16 DIAGNOSIS — Z8659 Personal history of other mental and behavioral disorders: Secondary | ICD-10-CM | POA: Insufficient documentation

## 2013-02-16 DIAGNOSIS — Z23 Encounter for immunization: Secondary | ICD-10-CM | POA: Insufficient documentation

## 2013-02-16 DIAGNOSIS — S81009A Unspecified open wound, unspecified knee, initial encounter: Secondary | ICD-10-CM | POA: Insufficient documentation

## 2013-02-16 DIAGNOSIS — Y939 Activity, unspecified: Secondary | ICD-10-CM | POA: Insufficient documentation

## 2013-02-16 MED ORDER — CLINDAMYCIN HCL 150 MG PO CAPS: 300.0000 mg | ORAL_CAPSULE | Freq: Three times a day (TID) | ORAL | Status: DC

## 2013-02-16 MED ORDER — TETANUS-DIPHTH-ACELL PERTUSSIS 5-2.5-18.5 LF-MCG/0.5 IM SUSP
0.5000 mL | Freq: Once | INTRAMUSCULAR | Status: AC
  Administered 2013-02-16: 0.5 mL via INTRAMUSCULAR
  Filled 2013-02-16: qty 0.5

## 2013-02-16 MED ORDER — RABIES IMMUNE GLOBULIN 150 UNIT/ML IM INJ
20.0000 [IU]/kg | INJECTION | Freq: Once | INTRAMUSCULAR | Status: AC
  Administered 2013-02-16: 1350 [IU]
  Filled 2013-02-16: qty 9

## 2013-02-16 MED ORDER — RABIES VACCINE, PCEC IM SUSR
1.0000 mL | Freq: Once | INTRAMUSCULAR | Status: AC
  Administered 2013-02-16: 1 mL via INTRAMUSCULAR
  Filled 2013-02-16: qty 1

## 2013-02-16 NOTE — ED Provider Notes (Signed)
Medical screening examination/treatment/procedure(s) were performed by non-physician practitioner and as supervising physician I was immediately available for consultation/collaboration.   Candyce Churn, MD 02/16/13 [phone]

## 2013-02-16 NOTE — ED Notes (Signed)
Faxed Rabies information schedule to Urgent Care.

## 2013-02-16 NOTE — ED Notes (Addendum)
Pt reports he was bitten and or scratched by a new cat last night and again this am. Pt reports the cat was given to him by his land lord and is unsure if the cat has rabies. Pt c/o Left arm pain at the site of the scratch marks

## 2013-02-16 NOTE — ED Notes (Signed)
Patient states that his landlord gave him a cat.   Patient states "the cat was wild and had no shots".   Patient further states that he gave the cat back to the landlord - [address], [place], [place] [address].   Patient stated that animal control was not called "because the landlady didn't want me too".

## 2013-02-16 NOTE — ED Provider Notes (Signed)
CSN: [id number]     Arrival date & time 02/16/13  1323 History  This chart was scribed for non-physician practitioner, Roxy Horseman, PA-C working with Candyce Churn, MD by Greggory Stallion, ED scribe. This patient was seen in room TR07C/TR07C and the patient's care was started at 2:10 PM.   Chief Complaint  Patient presents with  . Animal Bite   The history is provided by the patient. No language interpreter was used.   HPI Comments: Franklin [name] is a 32 y.o. male who presents to the Emergency Department complaining of a cat bite to his left arm and left leg. He states the cat was his landlord's cat. The landlord states the cat has not had its rabies shots yet. Pt denies any other associated symptoms.   Past Medical History  Diagnosis Date  . Depression    History reviewed. No pertinent past surgical history. History reviewed. No pertinent family history. History  Substance Use Topics  . Smoking status: Former Games developer  . Smokeless tobacco: Not on file  . Alcohol Use: No    Review of Systems A complete 10 system review of systems was obtained and all systems are negative except as noted in the HPI and PMH.   Allergies  Penicillins  Home Medications  No current outpatient prescriptions on file.  BP 141/85  Pulse 91  Temp(Src) 98 F (36.7 C) (Oral)  Resp 18  Ht 6' (1.829 m)  Wt 148 lb (67.132 kg)  BMI 20.07 kg/m2  SpO2 98%  Physical Exam  Nursing note and vitals reviewed. Constitutional: He is oriented to person, place, and time. He appears well-developed and well-nourished. No distress.  HENT:  Head: Normocephalic and atraumatic.  Eyes: EOM are normal.  Neck: Neck supple. No tracheal deviation present.  Cardiovascular: Normal rate.   Pulmonary/Chest: Effort normal. No respiratory distress.  Musculoskeletal: Normal range of motion.  Neurological: He is alert and oriented to person, place, and time.  Skin: Skin is warm and dry.  Very small puncture wound to  the left anterior ankle and left anterior forearm. No bleeding, discharge. No signs of infection.   Psychiatric: He has a normal mood and affect. His behavior is normal.    ED Course  Procedures (including critical care time)  DIAGNOSTIC STUDIES: Oxygen Saturation is 98% on RA, normal by my interpretation.    COORDINATION OF CARE: 2:17 PM-Discussed treatment plan which includes rabies vaccinations and antibiotic with pt at bedside and pt agreed to plan.   2:32 PM-Spoke to pharmacy about treatment plan. Pharmacist suggested clindamycin instead of Augmentin.   Labs Review Labs Reviewed - No data to display Imaging Review No results found.  MDM   1. Cat bite, initial encounter    Patient has been my straight. Will give rabies prophylaxis, and clindamycin, will also update tetanus. Patient discussed with Dr. Loretha Stapler, who agrees with the plan. The cat was not acting rabid, was not foaming at his mouth, but has an unknown history, and I question followup with animal control in this situation.   I personally performed the services described in this documentation, which was scribed in my presence. The recorded information has been reviewed and is accurate.    Roxy Horseman, PA-C 02/16/13 1528

## 2013-02-17 ENCOUNTER — Emergency Department (HOSPITAL_COMMUNITY)
Admission: EM | Admit: 2013-02-17 | Discharge: 2013-02-17 | Disposition: A | Discharge status: Home / Self Care | Payer: Medicaid Other | Attending: Emergency Medicine | Admitting: Emergency Medicine

## 2013-02-17 ENCOUNTER — Encounter (HOSPITAL_COMMUNITY): Payer: Self-pay | Admitting: Emergency Medicine

## 2013-02-17 DIAGNOSIS — Z87891 Personal history of nicotine dependence: Secondary | ICD-10-CM | POA: Insufficient documentation

## 2013-02-17 DIAGNOSIS — Z8659 Personal history of other mental and behavioral disorders: Secondary | ICD-10-CM | POA: Insufficient documentation

## 2013-02-17 DIAGNOSIS — M79609 Pain in unspecified limb: Secondary | ICD-10-CM | POA: Insufficient documentation

## 2013-02-17 DIAGNOSIS — Z880 Allergy status to penicillin: Secondary | ICD-10-CM | POA: Insufficient documentation

## 2013-02-17 DIAGNOSIS — Z792 Long term (current) use of antibiotics: Secondary | ICD-10-CM | POA: Insufficient documentation

## 2013-02-17 DIAGNOSIS — M25579 Pain in unspecified ankle and joints of unspecified foot: Secondary | ICD-10-CM | POA: Insufficient documentation

## 2013-02-17 DIAGNOSIS — R55 Syncope and collapse: Secondary | ICD-10-CM

## 2013-02-17 LAB — POCT I-STAT, CHEM 8
BUN: 15 mg/dL (ref 6–23)
Calcium, Ion: 1.24 mmol/L — ABNORMAL HIGH (ref 1.12–1.23)
Creatinine, Ser: 1 mg/dL (ref 0.50–1.35)
Glucose, Bld: 80 mg/dL (ref 70–99)
Hemoglobin: 16 g/dL (ref 13.0–17.0)
Sodium: 143 mEq/L (ref 135–145)
TCO2: 26 mmol/L (ref 0–100)

## 2013-02-17 MED ORDER — ACETAMINOPHEN 325 MG PO TABS
975.0000 mg | ORAL_TABLET | Freq: Once | ORAL | Status: AC
  Administered 2013-02-17: 975 mg via ORAL
  Filled 2013-02-17: qty 3

## 2013-02-17 NOTE — ED Provider Notes (Addendum)
CSN: [id number]     Arrival date & time 02/17/13  1113 History   First MD Initiated Contact with Patient 02/17/13 1218     Chief Complaint  Patient presents with  . Near Syncope   (Consider location/radiation/quality/duration/timing/severity/associated sxs/prior Treatment) HPI Patient reports he "blacked out this morning" and lay on the floor of his home for approximately 30 minutes. He only complains presently of mild arm pain and mild pain at left ankle where h bitten and scratched by a cat yesterday. He denies headache denies abdominal pain denies chest pain shortness of breath no other associated symptoms. Seen here yesterday prescribed clindamycin and received rabies vaccine and tetanus immunization. Denies other complaint.  Past Medical History  Diagnosis Date  . Depression    History reviewed. No pertinent past surgical history. No family history on file. History  Substance Use Topics  . Smoking status: Former Games developer  . Smokeless tobacco: Not on file  . Alcohol Use: No   no tobacco no alcohol or drugs  Review of Systems  Skin: Positive for wound.    Allergies  Penicillins  Home Medications   Current Outpatient Rx  Name  Route  Sig  Dispense  Refill  . clindamycin (CLEOCIN) 150 MG capsule   Oral   Take 2 capsules (300 mg total) by mouth 3 (three) times daily. May dispense as 150mg  capsules   60 capsule   0    BP 120/70  Pulse 64  Temp(Src) 97.9 F (36.6 C) (Oral)  Resp 20  SpO2 100% Physical Exam  Nursing note and vitals reviewed. Constitutional: He is oriented to person, place, and time. He appears well-developed and well-nourished.  HENT:  Head: Normocephalic and atraumatic.  Eyes: Conjunctivae are normal. Pupils are equal, round, and reactive to light.  Neck: Neck supple. No tracheal deviation present. No thyromegaly present.  Cardiovascular: Normal rate and regular rhythm.   No murmur heard. Pulmonary/Chest: Effort normal and breath sounds normal.   Abdominal: Soft. Bowel sounds are normal. He exhibits no distension. There is no tenderness.  Musculoskeletal: Normal range of motion. He exhibits no edema and no tenderness.  Neurological: He is alert and oriented to person, place, and time. Coordination normal.  Gait normal motor strength 5 over 5 overall. Not lightheaded on standing.  Skin: Skin is warm and dry. No rash noted.  Clean-appearing pinpoint puncture wound to left perform. Tiny abrasions to left ankle, clean-appearing  Psychiatric: He has a normal mood and affect.    ED Course  Procedures (including critical care time) Labs Review Labs Reviewed - No data to display Imaging Review No results found.  Date: 02/17/2013  Rate: 70  Rhythm: normal sinus rhythm  QRS Axis: normal  Intervals: normal  ST/T Wave abnormalities: early repolarization  Conduction Disutrbances:none  Narrative Interpretation:   Old EKG Reviewed: none available Results for orders placed during the hospital encounter of 02/17/13  POCT I-STAT, CHEM 8      Result Value Range   Sodium 143  135 - 145 mEq/L   Potassium 3.9  3.5 - 5.1 mEq/L   Chloride 106  96 - 112 mEq/L   BUN 15  6 - 23 mg/dL   Creatinine, Ser 4.09  0.50 - 1.35 mg/dL   Glucose, Bld 80  70 - 99 mg/dL   Calcium, Ion 8.11 (*) 1.12 - 1.23 mmol/L   TCO2 26  0 - 100 mmol/L   Hemoglobin 16.0  13.0 - 17.0 g/dL   HCT 91.4  78.2 -  52.0 %   No results found.  1:15 PM patient feels well and ready to home .alert ambulatory Glasgow Coma Score 15 t MDM  No diagnosis found. Strongly doubt cardiac etiology of syncope given highly atypical symptoms. Plan followup with PMD or return if symptoms recur  Dx syncope  Doug Sou, MD 02/17/13 1317  Doug Sou, MD 02/17/13 1318

## 2013-02-17 NOTE — ED Notes (Signed)
PT endorses syncopal episode this am. No headache associated. PT states that he was ambulatory after episode but weak.

## 2013-02-17 NOTE — ED Notes (Signed)
Pt. Stated, I was bitten by a cat yesterday and had the rabies shot and given medication.  This morning I hit the floor and blacked out.  Not sure if the medicine is too strong for me.

## 2013-02-17 NOTE — ED Notes (Signed)
PT reminded to disrobe

## 2013-02-18 ENCOUNTER — Emergency Department (HOSPITAL_COMMUNITY)
Admission: EM | Admit: 2013-02-18 | Discharge: 2013-02-18 | Disposition: A | Discharge status: Home / Self Care | Payer: Medicaid Other | Attending: Emergency Medicine | Admitting: Emergency Medicine

## 2013-02-18 ENCOUNTER — Encounter (HOSPITAL_COMMUNITY): Payer: Self-pay | Admitting: Emergency Medicine

## 2013-02-18 DIAGNOSIS — Y939 Activity, unspecified: Secondary | ICD-10-CM | POA: Insufficient documentation

## 2013-02-18 DIAGNOSIS — Z87891 Personal history of nicotine dependence: Secondary | ICD-10-CM | POA: Insufficient documentation

## 2013-02-18 DIAGNOSIS — Z792 Long term (current) use of antibiotics: Secondary | ICD-10-CM | POA: Insufficient documentation

## 2013-02-18 DIAGNOSIS — T368X5A Adverse effect of other systemic antibiotics, initial encounter: Secondary | ICD-10-CM | POA: Insufficient documentation

## 2013-02-18 DIAGNOSIS — R112 Nausea with vomiting, unspecified: Secondary | ICD-10-CM | POA: Insufficient documentation

## 2013-02-18 DIAGNOSIS — Z23 Encounter for immunization: Secondary | ICD-10-CM | POA: Insufficient documentation

## 2013-02-18 DIAGNOSIS — Y929 Unspecified place or not applicable: Secondary | ICD-10-CM | POA: Insufficient documentation

## 2013-02-18 DIAGNOSIS — T368X1A Poisoning by other systemic antibiotics, accidental (unintentional), initial encounter: Secondary | ICD-10-CM | POA: Insufficient documentation

## 2013-02-18 DIAGNOSIS — IMO0001 Reserved for inherently not codable concepts without codable children: Secondary | ICD-10-CM | POA: Insufficient documentation

## 2013-02-18 DIAGNOSIS — IMO0002 Reserved for concepts with insufficient information to code with codable children: Secondary | ICD-10-CM | POA: Insufficient documentation

## 2013-02-18 DIAGNOSIS — Z8659 Personal history of other mental and behavioral disorders: Secondary | ICD-10-CM | POA: Insufficient documentation

## 2013-02-18 DIAGNOSIS — Z789 Other specified health status: Secondary | ICD-10-CM

## 2013-02-18 DIAGNOSIS — Z880 Allergy status to penicillin: Secondary | ICD-10-CM | POA: Insufficient documentation

## 2013-02-18 NOTE — ED Notes (Signed)
Pt states he gets dizzy when he takes his antibiotics. Pt denies pain at present. Pt a/o x 4.

## 2013-02-18 NOTE — ED Notes (Signed)
Pt BIB EMS. Pt got at cat scratch on Friday. He was prescribed Cleocin and given rabies vaccinations. Pt then started "acting funny, like a zombie" stated a family member. Pt arrives a/o x 4. Pt states he has some abdominal pain. EMS reports pt's hands and feet are cool and clammy.

## 2013-02-18 NOTE — ED Notes (Signed)
MD at bedside. Dr. Wentz at bedside.  

## 2013-02-18 NOTE — ED Notes (Signed)
Pt left prior to receiving d/c instructions.

## 2013-02-18 NOTE — ED Provider Notes (Signed)
CSN: [id number]     Arrival date & time 02/18/13  1813 History   First MD Initiated Contact with Patient 02/18/13 1851     Chief Complaint  Patient presents with  . Medication Reaction   (Consider location/radiation/quality/duration/timing/severity/associated sxs/prior Treatment) HPI Comments: BRADLY SANGIOVANNI is a 32 y.o. male who states that he has nausea and vomiting associated with taking Cleocin. He was started on Cleocin 2 days ago for a cat scratch. He was scratched on the left arm, and left ankle, by a known cat. Animal control has been contacted about the incident, and is in contact with its owner,  regarding arrangements for observation, for rabies. The patient is not sure whether he was bitten or scratched on his left arm and left ankle. He denies fever, chills, cough, shortness of breath, headache, chest pain, ongoing, weakness, or paresthesia. He came in for evaluation by EMS. There are no other known modifying factors.  The history is provided by the patient.    Past Medical History  Diagnosis Date  . Depression    History reviewed. No pertinent past surgical history. No family history on file. History  Substance Use Topics  . Smoking status: Former Games developer  . Smokeless tobacco: Not on file  . Alcohol Use: No    Review of Systems  All other systems reviewed and are negative.    Allergies  Penicillins  Home Medications   Current Outpatient Rx  Name  Route  Sig  Dispense  Refill  . clindamycin (CLEOCIN) 150 MG capsule   Oral   Take 300 mg by mouth 3 (three) times daily. May dispense as 150mg  capsules          BP 125/66  Pulse 58  Temp(Src) 99.3 F (37.4 C) (Oral)  Resp 18  SpO2 97% Physical Exam  Nursing note and vitals reviewed. Constitutional: He is oriented to person, place, and time. He appears well-developed and well-nourished.  HENT:  Head: Normocephalic and atraumatic.  Right Ear: External ear normal.  Left Ear: External ear normal.  Eyes:  Conjunctivae and EOM are normal. Pupils are equal, round, and reactive to light.  Neck: Normal range of motion and phonation normal. Neck supple.  Cardiovascular: Normal rate and regular rhythm.   Pulmonary/Chest: Effort normal. He exhibits no bony tenderness.  Abdominal: Soft. Normal appearance. There is no tenderness.  Musculoskeletal: Normal range of motion.  Neurological: He is alert and oriented to person, place, and time. He has normal strength. No cranial nerve deficit or sensory deficit. He exhibits normal muscle tone. Coordination normal.  Skin: Skin is warm, dry and intact.  Evaluation of injuries allegedly scratched and/or bitten on the left forearm and left ankle; do not have any areas of redness, induration, deformity or fluctuance.  Psychiatric: He has a normal mood and affect. His behavior is normal. Judgment and thought content normal.    ED Course  Procedures (including critical care time)     MDM   1. Medication intolerance    Intolerance of clindamycin, given as prophylaxis for possible bacterial infection from cat wounds. He has also been started on rabies prophylaxis with immunotherapy. Immunotherapy may not be necessary if the cat can be observed and detained; this is in process with animal control. Currently, he does not have infections at the site of the wounds. There is no indication to continue Cleocin, at this point. The patient understands this recommendation. He plans on following up to continue his rabies prophylaxis.  Nursing Notes Reviewed/  Care Coordinated, and agree without changes. Applicable Imaging Reviewed.  Interpretation of Laboratory Data incorporated into ED treatment   Plan: Home Medications- Stop Cleocin; Home Treatments and Observation- rest. Gradually advance diet.; return here if the recommended treatment, does not improve the symptoms; Recommended follow up- PCP of choice prn      Flint Melter, MD 02/19/13 [phone]

## 2013-02-19 ENCOUNTER — Emergency Department (HOSPITAL_COMMUNITY)
Admission: EM | Admit: 2013-02-19 | Discharge: 2013-02-19 | Disposition: A | Discharge status: Home / Self Care | Payer: Medicaid Other | Attending: Emergency Medicine | Admitting: Emergency Medicine

## 2013-02-19 ENCOUNTER — Encounter (HOSPITAL_COMMUNITY): Payer: Self-pay | Admitting: Emergency Medicine

## 2013-02-19 DIAGNOSIS — Z880 Allergy status to penicillin: Secondary | ICD-10-CM | POA: Insufficient documentation

## 2013-02-19 DIAGNOSIS — Z87891 Personal history of nicotine dependence: Secondary | ICD-10-CM | POA: Insufficient documentation

## 2013-02-19 DIAGNOSIS — Z23 Encounter for immunization: Secondary | ICD-10-CM | POA: Insufficient documentation

## 2013-02-19 DIAGNOSIS — Z8659 Personal history of other mental and behavioral disorders: Secondary | ICD-10-CM | POA: Insufficient documentation

## 2013-02-19 MED ORDER — RABIES VACCINE, PCEC IM SUSR
1.0000 mL | Freq: Once | INTRAMUSCULAR | Status: AC
  Administered 2013-02-19: 1 mL via INTRAMUSCULAR
  Filled 2013-02-19: qty 1

## 2013-02-19 NOTE — ED Notes (Signed)
Patient stated "the landlord lied and told animal control that the cat was hers.  They didn't take it for observation".

## 2013-02-19 NOTE — ED Notes (Signed)
Coke given to patient per patient request.

## 2013-02-19 NOTE — ED Notes (Signed)
Needs a rabis shot he states has had 6 bitten by cat  Left arm and leg

## 2013-02-19 NOTE — ED Notes (Signed)
Patient request coke.  Will get and give to patient.

## 2013-02-19 NOTE — ED Provider Notes (Signed)
CSN: [id number]     Arrival date & time 02/19/13  9147 History  This chart was scribed for Jaynie Crumble, PA, working with Candyce Churn, MD by Blanchard Kelch, ED Scribe. This patient was seen in room TR06C/TR06C and the patient's care was started at 9:48 AM.    No chief complaint on file.   The history is provided by the patient. No language interpreter was used.    HPI Comments: Franklin [name] is a 32 y.o. male who presents to the Emergency Department to get his second round of shots for rabies vaccination. He was scratched by a cat about 6 days ago on left arm and leg. He got his first round of shots 3days ago and is back today for the rest of the shots. He denies any other symptoms. He was prescribed Cleocin due to the scratches but stopped taking it after it caused nausea and vomiting. He has no complaints at this time.    Past Medical History  Diagnosis Date  . Depression    History reviewed. No pertinent past surgical history. No family history on file. History  Substance Use Topics  . Smoking status: Former Games developer  . Smokeless tobacco: Not on file  . Alcohol Use: No    Review of Systems  All other systems reviewed and are negative.    Allergies  Penicillins  Home Medications  No current outpatient prescriptions on file. Triage Vitals: BP 120/74  Pulse 66  Temp(Src) 98.1 F (36.7 C)  Resp 16  SpO2 100%  Physical Exam  Nursing note and vitals reviewed. Constitutional: He is oriented to person, place, and time. He appears well-developed and well-nourished.  HENT:  Head: Normocephalic.  Eyes: EOM are normal.  Neck: Normal range of motion.  Cardiovascular: Normal rate and regular rhythm.   Pulmonary/Chest: Effort normal and breath sounds normal.  Neurological: He is alert and oriented to person, place, and time.  Skin: Skin is warm and dry.  Healing abrasion to left interior forearm. No signs of infection.    ED Course  Procedures (including  critical care time)  DIAGNOSTIC STUDIES: Oxygen Saturation is 100% on room air, normal by my interpretation.    COORDINATION OF CARE:  9:52 AM -Will order rabies vaccination. Patient verbalizes understanding and agrees with treatment plan.   Labs Review Labs Reviewed - No data to display Imaging Review No results found.  MDM   1. Need for rabies vaccination     He should is here 3 days after being scratched or bit by a cat. He received his initial immunoglobulin and vaccine for rabies 3 days ago. He is here for serial immunization. He has no complaints. Rabies vaccine administered in ER, given followup with urgent care for the rest of the shots.   I personally performed the services described in this documentation, which was scribed in my presence. The recorded information has been reviewed and is accurate.  Filed Vitals:   02/19/13 0906  BP: 120/74  Pulse: 66  Temp: 98.1 F (36.7 C)  Resp: 16  SpO2: 100%       Ellsie Violette A Laken Lobato, PA-C 02/19/13 1008

## 2013-02-21 NOTE — ED Provider Notes (Signed)
Medical screening examination/treatment/procedure(s) were performed by non-physician practitioner and as supervising physician I was immediately available for consultation/collaboration.    Candyce Churn, MD 02/21/13 [phone]

## 2013-02-23 ENCOUNTER — Encounter (HOSPITAL_COMMUNITY): Payer: Self-pay | Admitting: Emergency Medicine

## 2013-02-23 ENCOUNTER — Emergency Department (INDEPENDENT_AMBULATORY_CARE_PROVIDER_SITE_OTHER)
Admission: EM | Admit: 2013-02-23 | Discharge: 2013-02-23 | Disposition: A | Discharge status: Home / Self Care | Payer: Medicaid Other | Source: Home / Self Care

## 2013-02-23 DIAGNOSIS — Z203 Contact with and (suspected) exposure to rabies: Secondary | ICD-10-CM

## 2013-02-23 MED ORDER — RABIES VACCINE, PCEC IM SUSR
1.0000 mL | Freq: Once | INTRAMUSCULAR | Status: AC
  Administered 2013-02-23: 1 mL via INTRAMUSCULAR

## 2013-02-23 MED ORDER — RABIES VACCINE, PCEC IM SUSR
INTRAMUSCULAR | Status: AC
  Filled 2013-02-23: qty 1

## 2013-02-23 NOTE — ED Notes (Signed)
Pt here for rabies injection only.  Pt voices no concerns at this time.  

## 2013-04-11 ENCOUNTER — Emergency Department (HOSPITAL_COMMUNITY)
Admission: EM | Admit: 2013-04-11 | Discharge: 2013-04-11 | Disposition: A | Discharge status: Home / Self Care | Payer: Medicaid Other | Attending: Emergency Medicine | Admitting: Emergency Medicine

## 2013-04-11 ENCOUNTER — Encounter (HOSPITAL_COMMUNITY): Payer: Self-pay | Admitting: Emergency Medicine

## 2013-04-11 DIAGNOSIS — T4275XA Adverse effect of unspecified antiepileptic and sedative-hypnotic drugs, initial encounter: Secondary | ICD-10-CM | POA: Insufficient documentation

## 2013-04-11 DIAGNOSIS — R110 Nausea: Secondary | ICD-10-CM

## 2013-04-11 DIAGNOSIS — R1013 Epigastric pain: Secondary | ICD-10-CM | POA: Insufficient documentation

## 2013-04-11 DIAGNOSIS — R109 Unspecified abdominal pain: Secondary | ICD-10-CM

## 2013-04-11 DIAGNOSIS — T50905A Adverse effect of unspecified drugs, medicaments and biological substances, initial encounter: Secondary | ICD-10-CM

## 2013-04-11 DIAGNOSIS — T43205A Adverse effect of unspecified antidepressants, initial encounter: Secondary | ICD-10-CM | POA: Insufficient documentation

## 2013-04-11 DIAGNOSIS — F3289 Other specified depressive episodes: Secondary | ICD-10-CM | POA: Insufficient documentation

## 2013-04-11 DIAGNOSIS — R112 Nausea with vomiting, unspecified: Secondary | ICD-10-CM | POA: Insufficient documentation

## 2013-04-11 DIAGNOSIS — Z880 Allergy status to penicillin: Secondary | ICD-10-CM | POA: Insufficient documentation

## 2013-04-11 DIAGNOSIS — F329 Major depressive disorder, single episode, unspecified: Secondary | ICD-10-CM | POA: Insufficient documentation

## 2013-04-11 DIAGNOSIS — Z79899 Other long term (current) drug therapy: Secondary | ICD-10-CM | POA: Insufficient documentation

## 2013-04-11 LAB — COMPREHENSIVE METABOLIC PANEL
ALT: 21 U/L (ref 0–53)
AST: 18 U/L (ref 0–37)
Albumin: 4 g/dL (ref 3.5–5.2)
Calcium: 9.8 mg/dL (ref 8.4–10.5)
GFR calc Af Amer: >90 mL/min (ref >90)
Sodium: 138 mEq/L (ref 135–145)
Total Bilirubin: 1 mg/dL (ref 0.3–1.2)
Total Protein: 7.2 g/dL (ref 6.0–8.3)

## 2013-04-11 LAB — CBC
MCH: 29.8 pg (ref 26.0–34.0)
MCHC: 34.9 g/dL (ref 30.0–36.0)
Platelets: 168 10*3/uL (ref 150–400)
RBC: 5.14 MIL/uL (ref 4.22–5.81)
RDW: 12.3 % (ref 11.5–15.5)

## 2013-04-11 MED ORDER — ONDANSETRON 4 MG PO TBDP
4.0000 mg | ORAL_TABLET | Freq: Once | ORAL | Status: AC
  Administered 2013-04-11: 4 mg via ORAL
  Filled 2013-04-11: qty 1

## 2013-04-11 NOTE — ED Notes (Signed)
Per EMS, vomiting for 2 days-thinks it is related to prozac and gabapentin-called MD who prescribed meds and was told to come here-vomited small amount of blood this am

## 2013-04-11 NOTE — ED Notes (Signed)
Per pt has hx of depression was prescribed fluoxetine and medications switched 2 days ago to gabapentin. Pt reports urinary incontinence and sleep walking with gabapentin. Pt was taken off gabapentin and represcribed fluoxetine. Pt reports n/v this am with first dose of fluoxetine.

## 2013-04-11 NOTE — ED Notes (Signed)
Bed: WA16 Expected date:  Expected time:  Means of arrival:  Comments: EMS-N/V 

## 2013-04-11 NOTE — ED Provider Notes (Signed)
CSN: [id number]     Arrival date & time 04/11/13  4098 History   First MD Initiated Contact with Patient 04/11/13 0935     Chief Complaint  Patient presents with  . Emesis   (Consider location/radiation/quality/duration/timing/severity/associated sxs/prior Treatment) HPI Comments: Patient is a 32 y/o male with a PMHx of depression who presents to the ED complaining of mid-epigastric abdominal pain, nausea and vomiting beginning last night. Patient states he was prescribed gabapentin 2 days ago and it "made him sick", went back to his doctor who took him off gabapentin and prescribed fluoxetine. He took the first one this morning, states he vomited after taking both medication. Pain in abdomen is constant, non-radiating, 10/10. He has not tried any alleviating factors. Denies fever, bowel/urinary changes, alcohol use.  Patient is a 32 y.o. male presenting with vomiting. The history is provided by the patient.  Emesis Associated symptoms: abdominal pain     Past Medical History  Diagnosis Date  . Depression    History reviewed. No pertinent past surgical history. No family history on file. History  Substance Use Topics  . Smoking status: Former Games developer  . Smokeless tobacco: Not on file  . Alcohol Use: No    Review of Systems  Gastrointestinal: Positive for nausea, vomiting and abdominal pain.  All other systems reviewed and are negative.    Allergies  Penicillins  Home Medications   Current Outpatient Rx  Name  Route  Sig  Dispense  Refill  . FLUoxetine (PROZAC) 20 MG capsule   Oral   Take 20 mg by mouth daily. Resumed on 04/11/13.          BP 134/71  Pulse 57  Temp(Src) 98 F (36.7 C) (Oral)  Resp 16  SpO2 99% Physical Exam  Nursing note and vitals reviewed. Constitutional: He is oriented to person, place, and time. He appears well-developed and well-nourished. No distress.  HENT:  Head: Normocephalic and atraumatic.  Mouth/Throat: Oropharynx is clear and  moist.  Eyes: Conjunctivae are normal.  Neck: Normal range of motion. Neck supple.  Cardiovascular: Normal rate, regular rhythm and normal heart sounds.   Pulmonary/Chest: Effort normal and breath sounds normal.  Abdominal: Soft. Normal appearance and bowel sounds are normal. He exhibits no distension and no mass. There is tenderness in the epigastric area. There is no rigidity, no rebound and no guarding.  No peritoneal signs.  Musculoskeletal: Normal range of motion. He exhibits no edema.  Neurological: He is alert and oriented to person, place, and time.  Skin: Skin is warm and dry. He is not diaphoretic.  Psychiatric: He has a normal mood and affect. His behavior is normal.    ED Course  Procedures (including critical care time) Labs Review Labs Reviewed  CBC  COMPREHENSIVE METABOLIC PANEL  LIPASE, BLOOD   Imaging Review No results found.  EKG Interpretation   None       MDM   1. Medication reaction, initial encounter   2. Abdominal pain   3. Nausea     Patient with mid-epigastric abdominal pain, n/v. He is well appearing and in NAD, normal vital signs. Symptoms beginning after new medication use. Labs pending- cbc, cmp, lipase. Abdomen is soft without peritoneal signs.  10:47 AM Labs WNL. He tolerated PO without difficulty, after eating he is feeling a little better. Stable for discharge. Hold on his medications until f/u with PCP. Return precautions given. Patient states understanding of treatment care plan and is agreeable.   Trevor Mace,  PA-C 04/11/13 1048

## 2013-04-12 ENCOUNTER — Emergency Department (HOSPITAL_COMMUNITY): Payer: Medicaid Other

## 2013-04-12 ENCOUNTER — Emergency Department (HOSPITAL_COMMUNITY)
Admission: EM | Admit: 2013-04-12 | Discharge: 2013-04-12 | Disposition: A | Discharge status: Home / Self Care | Payer: Medicaid Other | Attending: Emergency Medicine | Admitting: Emergency Medicine

## 2013-04-12 ENCOUNTER — Encounter (HOSPITAL_COMMUNITY): Payer: Self-pay | Admitting: Emergency Medicine

## 2013-04-12 DIAGNOSIS — F3289 Other specified depressive episodes: Secondary | ICD-10-CM | POA: Insufficient documentation

## 2013-04-12 DIAGNOSIS — Z87891 Personal history of nicotine dependence: Secondary | ICD-10-CM | POA: Insufficient documentation

## 2013-04-12 DIAGNOSIS — Z880 Allergy status to penicillin: Secondary | ICD-10-CM | POA: Insufficient documentation

## 2013-04-12 DIAGNOSIS — F329 Major depressive disorder, single episode, unspecified: Secondary | ICD-10-CM | POA: Insufficient documentation

## 2013-04-12 DIAGNOSIS — R55 Syncope and collapse: Secondary | ICD-10-CM | POA: Insufficient documentation

## 2013-04-12 DIAGNOSIS — G40909 Epilepsy, unspecified, not intractable, without status epilepticus: Secondary | ICD-10-CM | POA: Insufficient documentation

## 2013-04-12 DIAGNOSIS — Z79899 Other long term (current) drug therapy: Secondary | ICD-10-CM | POA: Insufficient documentation

## 2013-04-12 HISTORY — DX: Unspecified convulsions: R56.9

## 2013-04-12 LAB — CBC WITH DIFFERENTIAL/PLATELET
Basophils Absolute: 0 10*3/uL (ref 0.0–0.1)
Eosinophils Absolute: 0 10*3/uL (ref 0.0–0.7)
Hemoglobin: 15.7 g/dL (ref 13.0–17.0)
Lymphs Abs: 1.5 10*3/uL (ref 0.7–4.0)
MCH: 30.7 pg (ref 26.0–34.0)
MCHC: 35.7 g/dL (ref 30.0–36.0)
MCV: 85.9 fL (ref 78.0–100.0)
Monocytes Relative: 7 % (ref 3–12)
Neutrophils Relative %: 76 % (ref 43–77)
RBC: 5.12 MIL/uL (ref 4.22–5.81)
RDW: 12.4 % (ref 11.5–15.5)

## 2013-04-12 LAB — COMPREHENSIVE METABOLIC PANEL
AST: 21 U/L (ref 0–37)
Alkaline Phosphatase: 68 U/L (ref 39–117)
BUN: 13 mg/dL (ref 6–23)
CO2: 26 mEq/L (ref 19–32)
Chloride: 105 mEq/L (ref 96–112)
Creatinine, Ser: 0.83 mg/dL (ref 0.50–1.35)
GFR calc non Af Amer: >90 mL/min (ref >90)
Potassium: 4.4 mEq/L (ref 3.5–5.1)
Sodium: 140 mEq/L (ref 135–145)
Total Bilirubin: 1.2 mg/dL (ref 0.3–1.2)

## 2013-04-12 LAB — POCT I-STAT TROPONIN I

## 2013-04-12 MED ORDER — SODIUM CHLORIDE 0.9 % IV SOLN
1000.0000 mL | INTRAVENOUS | Status: DC
  Administered 2013-04-12: 1000 mL via INTRAVENOUS

## 2013-04-12 NOTE — ED Notes (Signed)
Per EMS pt coming from home with c/o syncopal episode. Per EMS pt has hx of seizures. EMS sts was A+Ox4 on their arrival, not postictal.

## 2013-04-12 NOTE — ED Provider Notes (Signed)
CSN: [id number]     Arrival date & time 04/12/13  1720 History   First MD Initiated Contact with Patient 04/12/13 1742     Chief Complaint  Patient presents with  . Loss of Consciousness    HPI  Patient presents after a syncopal episode. Patient states that just prior to calling EMS he had an episode of lightheadedness, mild nausea, subsequently lost consciousness for several moments. Upon awakening him no pain. Currently he has no complaints. He states that he does have a history of depression, seizures.  He was prescribed new medications today, but has not initiated therapy.    Past Medical History  Diagnosis Date  . Depression   . Seizures    History reviewed. No pertinent past surgical history. History reviewed. No pertinent family history. History  Substance Use Topics  . Smoking status: Former Games developer  . Smokeless tobacco: Not on file  . Alcohol Use: No    Review of Systems  Constitutional:       Per HPI, otherwise negative  HENT:       Per HPI, otherwise negative  Respiratory:       Per HPI, otherwise negative  Cardiovascular:       Per HPI, otherwise negative  Gastrointestinal: Negative for vomiting.  Endocrine:       Negative aside from HPI  Genitourinary:       Neg aside from HPI   Musculoskeletal:       Per HPI, otherwise negative  Skin: Negative.   Neurological: Positive for seizures and syncope. Negative for dizziness, speech difficulty, weakness, light-headedness, numbness and headaches.    Allergies  Penicillins  Home Medications   Current Outpatient Rx  Name  Route  Sig  Dispense  Refill  . carbamazepine (TEGRETOL) 200 MG tablet   Oral   Take 400 mg by mouth at bedtime.         . haloperidol (HALDOL) 1 MG tablet   Oral   Take 1 mg by mouth at bedtime.          BP 125/72  Pulse 79  Temp(Src) 98.2 F (36.8 C) (Oral)  Resp 16  SpO2 97% Physical Exam  Nursing note and vitals reviewed. Constitutional: He is oriented to person,  place, and time. He appears well-developed. No distress.  HENT:  Head: Normocephalic and atraumatic.  Mouth/Throat:    Eyes: Conjunctivae and EOM are normal.  Cardiovascular: Normal rate and regular rhythm.   Pulmonary/Chest: Effort normal. No stridor. No respiratory distress.  Abdominal: He exhibits no distension.  Musculoskeletal: He exhibits no edema.  Neurological: He is alert and oriented to person, place, and time. He displays normal reflexes. No cranial nerve deficit. He exhibits normal muscle tone. Coordination normal.  Skin: Skin is warm and dry.  Psychiatric: He has a normal mood and affect.    ED Course  Procedures (including critical care time) Labs Review Labs Reviewed  CBC WITH DIFFERENTIAL  COMPREHENSIVE METABOLIC PANEL  POCT CBG (FASTING - GLUCOSE)-MANUAL ENTRY   Imaging Review No results found.  EKG Interpretation     Ventricular Rate:  75 PR Interval:  163 QRS Duration: 107 QT Interval:  357 QTC Calculation: 399 R Axis:   -17 Text Interpretation:  Sinus rhythm Probable left atrial enlargement Left ventricular hypertrophy ST elev, probable normal early repol pattern Baseline wander in lead(s) V2 Artifact No significant change since last tracing Abnormal ekg           Cardiac: 70 sr, nml  O2- 99%ra, nml   After the initial eval I reviewed the patient's chart, including recent ED eval. MDM  No diagnosis found. This patient presents after a prodromal syncopal episode, but notes no chest pain, headache either before or after the episode.  On my exam the patient is awake and alert, in no distress.  Patient does have history of seizure, and has its began recently prescribed seizure medication, and this may explain the event.  In addition the patient also has had multiple evaluations for syncope, according to him.  His no history of dysrhythmia.  Patient's EKG, labs here are largely reassuring, with no evidence of decompensation.  Patient is appropriate  for discharge with close outpatient followup.  He was encouraged to take his new medication regimen.  Gerhard Munch, MD 04/12/13 2027

## 2013-04-12 NOTE — ED Notes (Signed)
Patient transported to X-ray 

## 2013-04-12 NOTE — ED Notes (Signed)
Pt states "I blacked out." Pt denies head injury. Pt states "I head my butt." Pt denies pain.

## 2013-04-13 ENCOUNTER — Emergency Department (HOSPITAL_COMMUNITY): Payer: Medicaid Other

## 2013-04-13 ENCOUNTER — Encounter (HOSPITAL_COMMUNITY): Payer: Self-pay | Admitting: Emergency Medicine

## 2013-04-13 ENCOUNTER — Emergency Department (HOSPITAL_COMMUNITY)
Admission: EM | Admit: 2013-04-13 | Discharge: 2013-04-13 | Disposition: A | Discharge status: Home / Self Care | Payer: Medicaid Other | Attending: Emergency Medicine | Admitting: Emergency Medicine

## 2013-04-13 DIAGNOSIS — Z880 Allergy status to penicillin: Secondary | ICD-10-CM | POA: Insufficient documentation

## 2013-04-13 DIAGNOSIS — R569 Unspecified convulsions: Secondary | ICD-10-CM

## 2013-04-13 DIAGNOSIS — G40909 Epilepsy, unspecified, not intractable, without status epilepticus: Secondary | ICD-10-CM | POA: Insufficient documentation

## 2013-04-13 DIAGNOSIS — R55 Syncope and collapse: Secondary | ICD-10-CM | POA: Insufficient documentation

## 2013-04-13 DIAGNOSIS — Z8659 Personal history of other mental and behavioral disorders: Secondary | ICD-10-CM | POA: Insufficient documentation

## 2013-04-13 DIAGNOSIS — Z87891 Personal history of nicotine dependence: Secondary | ICD-10-CM | POA: Insufficient documentation

## 2013-04-13 NOTE — ED Notes (Signed)
MD at bedside. 

## 2013-04-13 NOTE — ED Provider Notes (Signed)
Medical screening examination/treatment/procedure(s) were performed by non-physician practitioner and as supervising physician I was immediately available for consultation/collaboration.  EKG Interpretation   None         Haeleigh Streiff J Messiah Rovira, MD 04/13/13 0708 

## 2013-04-13 NOTE — ED Notes (Signed)
Bed: WA15 Expected date:  Expected time:  Means of arrival:  Comments: EMS-SZ 

## 2013-04-13 NOTE — ED Notes (Signed)
Per EMS. Pt from home. EMS called out for seizures. Per home health, pt rolled out of bed and had seizure for 30 minutes. Pt was post ictal upon EMS arrival. Pt alert, oriented upon arrival to ED. Pt here yesterday for same.

## 2013-04-13 NOTE — ED Provider Notes (Signed)
CSN: [id number]     Arrival date & time 04/13/13  1304 History   First MD Initiated Contact with Patient 04/13/13 1307     Chief Complaint  Patient presents with  . Seizures  . Loss of Consciousness   (Consider location/radiation/quality/duration/timing/severity/associated sxs/prior Treatment) HPI Comments: 32 year old male presents after a questionable seizure episode. Per the friends at the bedside they arrived to his house and knows that he was acting like "a zombie". Patient slowly been progressing back to his baseline and is currently at his normal mental baseline. He is a "mood disorder" that he has just started being treated on Tegretol and Haldol. He just took these medications for soma site. He states that he has not taken any recent alcohol or new drugs. He states she's not really remember what happened. His girlfriend and his home health nurse stated he might have had a seizure. However the friends at the bedside do not know exactly what they were describing unable to provide clear history. Patient states that he is not known if he has a seizure disorder but has had many passing out spells in the past. He's currently been started on Tegretol for what Vesta Mixer thinks is a seizure disorder.   Past Medical History  Diagnosis Date  . Depression   . Seizures    History reviewed. No pertinent past surgical history. History reviewed. No pertinent family history. History  Substance Use Topics  . Smoking status: Former Games developer  . Smokeless tobacco: Not on file  . Alcohol Use: No    Review of Systems  Constitutional: Negative for fever.  Respiratory: Negative for shortness of breath.   Cardiovascular: Negative for chest pain.  Gastrointestinal: Negative for vomiting and abdominal pain.  Genitourinary: Negative for dysuria.  Musculoskeletal: Negative for neck pain and neck stiffness.  Neurological: Positive for seizures and syncope. Negative for speech difficulty, weakness, numbness  and headaches.  All other systems reviewed and are negative.    Allergies  Penicillins  Home Medications   Current Outpatient Rx  Name  Route  Sig  Dispense  Refill  . carbamazepine (TEGRETOL) 200 MG tablet   Oral   Take 400 mg by mouth at bedtime.         . haloperidol (HALDOL) 1 MG tablet   Oral   Take 1 mg by mouth at bedtime.          BP 133/72  Pulse 53  Temp(Src) 98.9 F (37.2 C) (Oral)  Resp 13  SpO2 99% Physical Exam  Nursing note and vitals reviewed. Constitutional: He is oriented to person, place, and time. He appears well-developed and well-nourished.  HENT:  Head: Normocephalic.    Right Ear: External ear normal.  Left Ear: External ear normal.  Nose: Nose normal.  Eyes: EOM are normal. Pupils are equal, round, and reactive to light. Right eye exhibits no discharge. Left eye exhibits no discharge.  Neck: Neck supple.  Cardiovascular: Normal rate, regular rhythm, normal heart sounds and intact distal pulses.   No murmur heard. Pulmonary/Chest: Effort normal and breath sounds normal.  Abdominal: Soft. He exhibits no distension. There is no tenderness.  Musculoskeletal: He exhibits no edema.  Neurological: He is alert and oriented to person, place, and time. He has normal strength. No cranial nerve deficit or sensory deficit. GCS eye subscore is 4. GCS verbal subscore is 5. GCS motor subscore is 6.  Skin: Skin is warm and dry.    ED Course  Procedures (including critical care time)  Labs Review Labs Reviewed - No data to display Imaging Review Dg Chest 2 View  04/12/2013   CLINICAL DATA:  32 year old male with loss of consciousness.  EXAM: CHEST  2 VIEW  COMPARISON:  02/04/2012 and prior chest radiographs  FINDINGS: The cardiomediastinal silhouette is unremarkable.  There is no evidence of focal airspace disease, pulmonary edema, suspicious pulmonary nodule/mass, pleural effusion, or pneumothorax. No acute bony abnormalities are identified.   IMPRESSION: No active cardiopulmonary disease.   Electronically Signed   By: Laveda Abbe M.D.   On: 04/12/2013 18:50   Ct Head Wo Contrast  04/13/2013   CLINICAL DATA:  Seizures.  Postictal.  Now alert.  EXAM: CT HEAD WITHOUT CONTRAST  TECHNIQUE: Contiguous axial images were obtained from the base of the skull through the vertex without intravenous contrast.  COMPARISON:  CT of the head on 06/22/2006  FINDINGS: There is no intra or extra-axial fluid collection or mass lesion. The basilar cisterns and ventricles have a normal appearance. There is no CT evidence for acute infarction or hemorrhage. The bone windows have a normal appearance. Paranasal and mastoid sinuses are well aerated.  IMPRESSION: No evidence for acute intracranial abnormality.   Electronically Signed   By: Rosalie Gums M.D.   On: 04/13/2013 14:19    EKG Interpretation     Ventricular Rate:  56 PR Interval:  153 QRS Duration: 116 QT Interval:  391 QTC Calculation: 377 R Axis:   -9 Text Interpretation:  Sinus rhythm Probable left ventricular hypertrophy No significant change since last tracing            MDM   1. Seizure    Patient is a very poor historian. He is at his normal mental status and has a normal neurologic exam. Syncope vs seizures. Initially states he's never had seizures before so a CT was obtained. However on re-eval patient states he's been passing out for "years". Labs yesterday benign, glucose from EMS normal, do not feel he needs further lab testing. Discussed his EKG with Dr. Laurence Slate who feels he has an incomplete RBB but does not feel it resembles Brugada at all. Recommends PCP f/u. SW talked to patient as he's been to ED several times in last few weeks for many complaints, has arranged f/u within the next 1 week with a PCP.    Audree Camel, MD 04/13/13 [phone]

## 2013-04-13 NOTE — Progress Notes (Addendum)
   CARE MANAGEMENT ED NOTE 04/13/2013  Patient:  Franklin [name], Franklin [name]   Account Number:  [id number]  Date Initiated:  04/13/2013  Documentation initiated by:  Edd Arbour  Subjective/Objective Assessment:   32 yr old medicaid [place] access pt c/o ? seizure 8 ED visits and no admissions in last 6 months - 3 ED visits in this week for same concerns     Subjective/Objective Assessment Detail:   2 males a bedside reports pt "just fell out" pcp is Dr Allyne Gee, NP/PA Manson Passey Pt confirms he has been taking his medication on time and qd     Action/Plan:   Cm assessed pt CM spoke with Kathie Rhodes at [facility] to inform her of pt being at [facility] ED for ? seizure & unable to make appointment for 04/13/13 CM scheduled pt a f/u appt for 04/20/13 at 3 pm with Sherryll Burger, NP   Action/Plan Detail:   CM faxed information [phone]) to Dr Allyne Gee and NP/PA for 04/13/13 [facility] ED visit Placed follow up appointment information in EPIC d/c instructions and follow up section CM informed pt of his follow up on Friday 04/20/13 at 3 pm   Anticipated DC Date:  04/13/2013     Status Recommendation to Physician:   Result of Recommendation:    Other ED Services  Consult Working Plan    DC Planning Services  Other  PCP issues  Other    Choice offered to / List presented to:            Status of service:  Completed, signed off  ED Comments:   ED Comments Detail:   Fax confirmation for clinicals sent to pcp received 04/13/13 at 16:59

## 2013-04-25 ENCOUNTER — Emergency Department (HOSPITAL_COMMUNITY)
Admission: EM | Admit: 2013-04-25 | Discharge: 2013-04-25 | Disposition: A | Discharge status: Home / Self Care | Payer: Medicaid Other | Attending: Emergency Medicine | Admitting: Emergency Medicine

## 2013-04-25 ENCOUNTER — Encounter (HOSPITAL_COMMUNITY): Payer: Self-pay | Admitting: Emergency Medicine

## 2013-04-25 DIAGNOSIS — K5900 Constipation, unspecified: Secondary | ICD-10-CM

## 2013-04-25 DIAGNOSIS — K625 Hemorrhage of anus and rectum: Secondary | ICD-10-CM

## 2013-04-25 DIAGNOSIS — F3289 Other specified depressive episodes: Secondary | ICD-10-CM | POA: Insufficient documentation

## 2013-04-25 DIAGNOSIS — F329 Major depressive disorder, single episode, unspecified: Secondary | ICD-10-CM | POA: Insufficient documentation

## 2013-04-25 DIAGNOSIS — Z880 Allergy status to penicillin: Secondary | ICD-10-CM | POA: Insufficient documentation

## 2013-04-25 DIAGNOSIS — G40909 Epilepsy, unspecified, not intractable, without status epilepticus: Secondary | ICD-10-CM | POA: Insufficient documentation

## 2013-04-25 DIAGNOSIS — Z87891 Personal history of nicotine dependence: Secondary | ICD-10-CM | POA: Insufficient documentation

## 2013-04-25 LAB — POCT I-STAT, CHEM 8
BUN: 15 mg/dL (ref 6–23)
Calcium, Ion: 1.29 mmol/L — ABNORMAL HIGH (ref 1.12–1.23)
Glucose, Bld: 65 mg/dL — ABNORMAL LOW (ref 70–99)
Hemoglobin: 15.3 g/dL (ref 13.0–17.0)
TCO2: 29 mmol/L (ref 0–100)

## 2013-04-25 LAB — OCCULT BLOOD, POC DEVICE: Fecal Occult Bld: NEGATIVE

## 2013-04-25 NOTE — Progress Notes (Signed)
Patient confirms his pcp is located at [facility] in [place].  EDCM unable to locate this practice.

## 2013-04-25 NOTE — ED Notes (Signed)
He states he had a "hard b.m." this afternoon, and with it he saw a sm. Quantity of bright red blood.  Upon seeing the blood, he became "a little dizzy".  He is in no distress, and is healthy-looking.

## 2013-04-25 NOTE — ED Provider Notes (Signed)
Medical screening examination/treatment/procedure(s) were performed by non-physician practitioner and as supervising physician I was immediately available for consultation/collaboration.  EKG Interpretation   None        Ethelda Chick, MD 04/25/13 1659

## 2013-04-25 NOTE — ED Provider Notes (Signed)
CSN: [id number]     Arrival date & time 04/25/13  1430 History   First MD Initiated Contact with Patient 04/25/13 1506     Chief Complaint  Patient presents with  . Rectal Bleeding   (Consider location/radiation/quality/duration/timing/severity/associated sxs/prior Treatment) Patient is a 32 y.o. male presenting with hematochezia. The history is provided by the patient and medical records. No language interpreter was used.  Rectal Bleeding Associated symptoms: no abdominal pain, no fever, no light-headedness and no vomiting     Franklin [name] is a 32 y.o. male  with a hx of depression seizure disorder (on Tegretol and Haldol) presents to the Emergency Department complaining of one episode of bright red blood per rectum after "a hard BM."  Patient reports he strained to have a bowel movement today and noticed a small amount of blood on the toilet paper. He reports brown stool streaked with bright red blood. He reports he became dizzy when he saw the blood because he did not like blood.  Denies syncopal episode or lightheadedness.  Bleeding stopped spontaneously and he has not noted any blood in his underwear. Nothing makes symptoms better or worse. Patient denies fever, chills, headache neck pain, chest pain, shortness of breath no abdominal pain, nausea vomiting, diarrhea, rectal pain, dysuria, hematuria.  Past Medical History  Diagnosis Date  . Depression   . Seizures    History reviewed. No pertinent past surgical history. History reviewed. No pertinent family history. History  Substance Use Topics  . Smoking status: Former Games developer  . Smokeless tobacco: Not on file  . Alcohol Use: No    Review of Systems  Constitutional: Negative for fever, diaphoresis, appetite change, fatigue and unexpected weight change.  HENT: Negative for mouth sores.   Eyes: Negative for visual disturbance.  Respiratory: Negative for cough, chest tightness, shortness of breath and wheezing.    Cardiovascular: Negative for chest pain.  Gastrointestinal: Positive for hematochezia and anal bleeding. Negative for nausea, vomiting, abdominal pain, diarrhea and constipation.  Endocrine: Negative for polydipsia, polyphagia and polyuria.  Genitourinary: Negative for dysuria, urgency, frequency and hematuria.  Musculoskeletal: Negative for back pain and neck stiffness.  Skin: Negative for rash.  Allergic/Immunologic: Negative for immunocompromised state.  Neurological: Negative for syncope, light-headedness and headaches.  Hematological: Does not bruise/bleed easily.  Psychiatric/Behavioral: Negative for sleep disturbance. The patient is not nervous/anxious.     Allergies  Penicillins  Home Medications   Current Outpatient Rx  Name  Route  Sig  Dispense  Refill  . Vitamin D, Ergocalciferol, (DRISDOL) 50000 UNITS CAPS capsule   Oral   Take 50,000 Units by mouth 2 (two) times a week.         . carbamazepine (TEGRETOL) 200 MG tablet   Oral   Take 400 mg by mouth at bedtime.         . haloperidol (HALDOL) 1 MG tablet   Oral   Take 1 mg by mouth at bedtime.          BP 118/72  Pulse 65  Temp(Src) 98.3 F (36.8 C) (Oral)  Resp 16  SpO2 100% Physical Exam  Nursing note and vitals reviewed. Constitutional: He appears well-developed and well-nourished. No distress.  Awake, alert, nontoxic appearance  HENT:  Head: Normocephalic and atraumatic.  Mouth/Throat: Oropharynx is clear and moist. No oropharyngeal exudate.  Eyes: Conjunctivae are normal. No scleral icterus.  Neck: Normal range of motion. Neck supple.  Cardiovascular: Normal rate, regular rhythm, normal heart sounds and intact distal  pulses.   Pulmonary/Chest: Effort normal and breath sounds normal. No respiratory distress. He has no wheezes.  Abdominal: Soft. Bowel sounds are normal. He exhibits no distension and no mass. There is no tenderness. There is no rebound and no guarding.  Genitourinary: Rectal exam  shows no external hemorrhoid, no internal hemorrhoid, no fissure, no mass, no tenderness and anal tone normal. Prostate is not enlarged and not tender.  Prostate non-boggy, nontender No gross blood on DRE No fissure noted No visible external hemorrhoids.    Musculoskeletal: Normal range of motion. He exhibits no edema.  Neurological: He is alert.  Speech is clear and goal oriented Moves extremities without ataxia  Skin: Skin is warm and dry. He is not diaphoretic.  Psychiatric: He has a normal mood and affect.    ED Course  Procedures (including critical care time) Labs Review Labs Reviewed  POCT I-STAT, CHEM 8 - Abnormal; Notable for the following:    Creatinine, Ser 1.40 (*)    Glucose, Bld 65 (*)    Calcium, Ion 1.29 (*)    All other components within normal limits  OCCULT BLOOD, POC DEVICE   Imaging Review No results found.  EKG Interpretation   None       MDM   1. Rectal bleeding   2. Constipation      Franklin [name] presents with small amount of BRBPR after hard BM this afternoon without persistent bleeding.  Record review shows that pt was seen approx 1 year ago for the same complaint and was found to be stable at this time.  No gross blood on DRE.  Pt vitals stable without hypotension, fever or tachycardia.    Fecal Occult negative for blood and chemistry without anemia.  Of note, pt with elevated creatinie to 1.40, but no elevation in his BUN. Pt is alert, nontoxic, nonseptic appearing.    Discussed findings with patient and the importance of f/u and recheck of his kidney function.  Patient reports he has a followup appointment with [facility] on December 9.  It has been determined that no acute conditions requiring further emergency intervention are present at this time. The patient/guardian have been advised of the diagnosis and plan. We have discussed signs and symptoms that warrant return to the ED, such as changes or worsening in symptoms.    Vital signs are stable at discharge.   BP 118/72  Pulse 65  Temp(Src) 98.3 F (36.8 C) (Oral)  Resp 16  SpO2 100%  Patient/guardian has voiced understanding and agreed to follow-up with the PCP or specialist.        Dierdre Forth, PA-C 04/25/13 1658

## 2013-04-27 ENCOUNTER — Emergency Department (HOSPITAL_COMMUNITY)
Admission: EM | Admit: 2013-04-27 | Discharge: 2013-04-27 | Disposition: A | Discharge status: Home / Self Care | Payer: Medicaid Other | Attending: Emergency Medicine | Admitting: Emergency Medicine

## 2013-04-27 ENCOUNTER — Emergency Department (HOSPITAL_COMMUNITY): Payer: Medicaid Other

## 2013-04-27 ENCOUNTER — Encounter (HOSPITAL_COMMUNITY): Payer: Self-pay | Admitting: Emergency Medicine

## 2013-04-27 ENCOUNTER — Other Ambulatory Visit: Payer: Self-pay

## 2013-04-27 DIAGNOSIS — R569 Unspecified convulsions: Secondary | ICD-10-CM | POA: Insufficient documentation

## 2013-04-27 DIAGNOSIS — R9431 Abnormal electrocardiogram [ECG] [EKG]: Secondary | ICD-10-CM | POA: Insufficient documentation

## 2013-04-27 DIAGNOSIS — Z87891 Personal history of nicotine dependence: Secondary | ICD-10-CM | POA: Insufficient documentation

## 2013-04-27 DIAGNOSIS — F3289 Other specified depressive episodes: Secondary | ICD-10-CM | POA: Insufficient documentation

## 2013-04-27 DIAGNOSIS — R002 Palpitations: Secondary | ICD-10-CM | POA: Insufficient documentation

## 2013-04-27 DIAGNOSIS — Z880 Allergy status to penicillin: Secondary | ICD-10-CM | POA: Insufficient documentation

## 2013-04-27 DIAGNOSIS — Z79899 Other long term (current) drug therapy: Secondary | ICD-10-CM | POA: Insufficient documentation

## 2013-04-27 DIAGNOSIS — F411 Generalized anxiety disorder: Secondary | ICD-10-CM | POA: Insufficient documentation

## 2013-04-27 DIAGNOSIS — F329 Major depressive disorder, single episode, unspecified: Secondary | ICD-10-CM | POA: Insufficient documentation

## 2013-04-27 DIAGNOSIS — I4510 Unspecified right bundle-branch block: Secondary | ICD-10-CM | POA: Insufficient documentation

## 2013-04-27 LAB — CBC WITH DIFFERENTIAL/PLATELET
Basophils Absolute: 0 K/uL (ref 0.0–0.1)
Basophils Relative: 0 % (ref 0–1)
Eosinophils Absolute: 0 K/uL (ref 0.0–0.7)
Eosinophils Relative: 0 % (ref 0–5)
HCT: 41.8 % (ref 39.0–52.0)
Hemoglobin: 15 g/dL (ref 13.0–17.0)
Lymphocytes Relative: 11 % — ABNORMAL LOW (ref 12–46)
Lymphs Abs: 1.2 K/uL (ref 0.7–4.0)
MCH: 30.5 pg (ref 26.0–34.0)
MCHC: 35.9 g/dL (ref 30.0–36.0)
MCV: 85.1 fL (ref 78.0–100.0)
Monocytes Absolute: 0.7 K/uL (ref 0.1–1.0)
Monocytes Relative: 6 % (ref 3–12)
Neutro Abs: 9.4 K/uL — ABNORMAL HIGH (ref 1.7–7.7)
Neutrophils Relative %: 82 % — ABNORMAL HIGH (ref 43–77)
Platelets: 186 K/uL (ref 150–400)
RBC: 4.91 MIL/uL (ref 4.22–5.81)
RDW: 12.3 % (ref 11.5–15.5)
WBC: 11.4 K/uL — ABNORMAL HIGH (ref 4.0–10.5)

## 2013-04-27 LAB — BASIC METABOLIC PANEL WITH GFR
BUN: 11 mg/dL (ref 6–23)
CO2: 26 meq/L (ref 19–32)
Calcium: 9.5 mg/dL (ref 8.4–10.5)
Chloride: 102 meq/L (ref 96–112)
Creatinine, Ser: 0.81 mg/dL (ref 0.50–1.35)
GFR calc Af Amer: >90 mL/min (ref >90)
GFR calc non Af Amer: >90 mL/min (ref >90)
Glucose, Bld: 85 mg/dL (ref 70–99)
Potassium: 3.3 meq/L — ABNORMAL LOW (ref 3.5–5.1)
Sodium: 139 meq/L (ref 135–145)

## 2013-04-27 LAB — D-DIMER, QUANTITATIVE: D-Dimer, Quant: <0.27 ug{FEU}/mL (ref 0.00–0.48)

## 2013-04-27 MED ORDER — ALBUTEROL SULFATE HFA 108 (90 BASE) MCG/ACT IN AERS: 1.0000 | INHALATION_SPRAY | Freq: Four times a day (QID) | RESPIRATORY_TRACT | Status: DC | PRN

## 2013-04-27 NOTE — ED Notes (Signed)
Pt returned from xray
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
## 2013-04-27 NOTE — ED Provider Notes (Signed)
Medical screening examination/treatment/procedure(s) were performed by non-physician practitioner and as supervising physician I was immediately available for consultation/collaboration.  EKG Interpretation    Date/Time:  Friday April 27 2013 15:33:04 EST Ventricular Rate:  91 PR Interval:  166 QRS Duration: 104 QT Interval:  340 QTC Calculation: 418 R Axis:   0 Text Interpretation:  Normal sinus rhythm Incomplete right bundle branch block Left ventricular hypertrophy Abnormal ECG Confirmed by WARD  DO, KRISTEN ([id number]) on 04/27/2013 4:31:29 PM              Layla Maw Ward, DO 04/27/13 1843
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
## 2013-11-14 ENCOUNTER — Encounter (HOSPITAL_COMMUNITY): Payer: Self-pay | Admitting: Emergency Medicine

## 2013-11-14 ENCOUNTER — Emergency Department (HOSPITAL_COMMUNITY)
Admission: EM | Admit: 2013-11-14 | Discharge: 2013-11-14 | Disposition: A | Discharge status: Home / Self Care | Payer: Medicaid Other | Attending: Emergency Medicine | Admitting: Emergency Medicine

## 2013-11-14 DIAGNOSIS — Z8659 Personal history of other mental and behavioral disorders: Secondary | ICD-10-CM | POA: Insufficient documentation

## 2013-11-14 DIAGNOSIS — F410 Panic disorder [episodic paroxysmal anxiety] without agoraphobia: Secondary | ICD-10-CM

## 2013-11-14 DIAGNOSIS — Z8669 Personal history of other diseases of the nervous system and sense organs: Secondary | ICD-10-CM | POA: Diagnosis not present

## 2013-11-14 DIAGNOSIS — F411 Generalized anxiety disorder: Secondary | ICD-10-CM | POA: Insufficient documentation

## 2013-11-14 DIAGNOSIS — Z79899 Other long term (current) drug therapy: Secondary | ICD-10-CM | POA: Diagnosis not present

## 2013-11-14 DIAGNOSIS — Z880 Allergy status to penicillin: Secondary | ICD-10-CM | POA: Insufficient documentation

## 2013-11-14 DIAGNOSIS — Z87891 Personal history of nicotine dependence: Secondary | ICD-10-CM | POA: Insufficient documentation

## 2013-11-14 NOTE — Discharge Instructions (Signed)
Use the resource guide below for mental health follow up.    Emergency Department Resource Guide 1) Find a Doctor and Pay Out of Pocket Although you won't have to find out who is covered by your insurance plan, it is a good idea to ask around and get recommendations. You will then need to call the office and see if the doctor you have chosen will accept you as a new patient and what types of options they offer for patients who are self-pay. Some doctors offer discounts or will set up payment plans for their patients who do not have insurance, but you will need to ask so you aren't surprised when you get to your appointment.  2) Contact Your Local Health Department Not all health departments have doctors that can see patients for sick visits, but many do, so it is worth a call to see if yours does. If you don't know where your local health department is, you can check in your phone book. The CDC also has a tool to help you locate your state's health department, and many state websites also have listings of all of their local health departments.  3) Find a [facility] If your illness is not likely to be very severe or complicated, you may want to try a walk in clinic. These are popping up all over the country in pharmacies, drugstores, and shopping centers. They're usually staffed by nurse practitioners or physician assistants that have been trained to treat common illnesses and complaints. They're usually fairly quick and inexpensive. However, if you have serious medical issues or chronic medical problems, these are probably not your best option.  No Primary Care Doctor: - Call Health Connect at  [phone] - they can help you locate a primary care doctor that  accepts your insurance, provides certain services, etc. - Physician Referral Service- [phone]  Chronic Pain Problems: Organization         Address  Phone   Notes  [facility]  [phone] Patients need to be  referred by their primary care doctor.   Medication Assistance: Organization         Address  Phone   Notes  [facility] Medication [facility] [address] [place], [place] [address] [phone] --Must be a resident of [facility] [place] -- Must have NO insurance coverage whatsoever (no Medicaid/ Medicare, etc.) -- The pt. MUST have a primary care doctor that directs their care regularly and follows them in the community   MedAssist  [phone]   [employer]  [phone]    Agencies that provide inexpensive medical care: Organization         Address  Phone   Notes  [place]  [phone]   [facility]    [phone]   [facility] [place], [place] [address] [phone]   [place] [address] [place] [phone]   Planned Parenthood    [phone]   [facility]    [phone]   [place] and [place] [address], [place] Phone:  [phone], Fax:  [phone] Hours of Operation:  9 am - 6 pm, M-F.  Also accepts Medicaid/Medicare and self-pay.  [facility] for [place] [address], [address], [place] Phone: [phone], Fax: [phone]. Hours of Operation:  8:30 am - 5:30  pm, M-F.  Also accepts Medicaid and self-pay.  [facility] [place] [address] [place] Phone: [phone]   [place], [place], [place] [phone], Ext. [phone] Mondays & Thursdays: 7-9 AM.  First 15 patients are seen on a first come, first serve basis.    [place] Providers:  Organization         Address  Phone   Notes  [facility] [address], [place] [phone] Also accepts self-pay patients.  [facility] [address], [place]  [phone]   [place], [address], [place] [phone]   [facility] [address], [place] [phone]   Lucianne Lei [address], [place]   [phone] Only accepts [place] patients after they have their name applied to their card.   Self-Pay (no insurance) in [facility]:  Organization         Address  Phone   Notes  Sickle Cell Patients, [facility] Internal Medicine [place] [phone]   [facility] Urgent Care [facility] [place] [facility] [phone]   [facility] [place]  [place], [place], [place] [phone]   [facility]/Dr. Osei-Bonsu  [address], [place] or [address], [place] [phone] Phone number for both [place] and [place] locations is the same.  Urgent Medical and [facility] [address], [place] [phone]   [facility] [address] [place] or [address] [phone]   [facility] [address], [place] [phone], phone; [phone], fax Sees patients 1st and 3rd Saturday of every month.  Must not qualify for public or private insurance (i.e. Medicaid, Medicare, [place] Health Choice, Veterans' Benefits)  Household income should be no more than 200% of the poverty level The clinic cannot treat you if you are pregnant or think you are pregnant  Sexually transmitted diseases are not treated at the clinic.    Dental Care: Organization         Address  Phone  Notes  [facility] Department of [facility] [place] [phone] Accepts children up to age 42 who are enrolled in [place] or [place]; pregnant women with a Medicaid card; and children who have applied for Medicaid or [place] Health Choice, but were declined, whose parents can  pay a reduced fee at time of service.  [facility] Department of [facility]  [address], [place] [phone] Accepts children up to age 17 who are enrolled in [place] or [place]; pregnant women with a Medicaid card; and children who have applied for Medicaid or [place] Health Choice, but were declined, whose parents can pay a reduced fee at time of service.  [place] Adult Dental Access PROGRAM  [place] [phone] Patients are seen by appointment only. Walk-ins are not accepted. [place] will see patients 86 years of age and older. Monday - Tuesday (8am-5pm) Most Wednesdays (8:30-5pm) $30 per visit, cash only  [facility] Adult Dental Access PROGRAM  [address], [facility] [phone] Patients are seen by appointment only. Walk-ins are not  accepted. [place] will see patients 12 years of age and older. One Wednesday Evening (Monthly: Volunteer Based).  $30 per visit, cash only  [place]  [phone] for adults; Children under age 30, call Graduate Pediatric Dentistry at [phone]. Children aged 43-14, please call [phone] to request a pediatric application.  Dental services are provided in all areas of dental care including fillings, crowns and bridges, complete and partial dentures, implants, gum treatment, root canals, and extractions. Preventive care is also provided. Treatment is provided to both adults and children. Patients are selected via a lottery and there is often a waiting list.   [facility] [address] [place]  [phone] [email or web address]   [facility] [address], [place] [phone], Ext. [phone] Second and Fourth Thursday of each month, opens at 6:30 AM; Clinic ends at 9 AM.  Patients are seen on a first-come first-served basis, and a limited number are seen during each clinic.   [facility]  [address] Hillard Danker [place], [place] [phone]   Eligibility Requirements You must have lived in [place], [place], or [place] counties for at least the last three months.   You cannot be eligible for state or federal sponsored [employer], including [employer], [place], or [employer].   You generally cannot be eligible for healthcare insurance through your employer.    How to apply: Eligibility screenings are held every Tuesday and Wednesday afternoon from 1:00 pm until 4:00 pm. You do not need an appointment for the interview!  [facility] [address] [place], [place]   Advance  [place] Department  [employer]  [phone]    Behavioral Health Resources in the Community: Intensive Outpatient Programs Organization         Address  Phone  Notes  Ray Edroy. [address], [place], [place] [phone]   [facility] Outpatient [address], [place], [place]   ADS: Alcohol & Drug Svcs [address] [place], [place]   [place] [address]  [place], [place] or [phone]   Substance Abuse Resources Organization         Address  Phone  Notes  Alcohol and Drug Services  [phone]   [place]  [phone]   The [place]   Chinita Pester  [phone]   Residential & Outpatient Substance Abuse Program  [phone]   Psychological Services Organization         Address  Phone  Notes  [facility] [place]  [place]  [phone]   [place] [address] [place] or [phone]    Mobile Crisis Teams Organization         Address  Phone  Notes  [facility], Mobile Crisis Care Unit  [phone]    [facility]ive Psychotherapeutic Services  [address] [place], [place]   [place] Levels [address] [place] [phone]    Self-Help/Support Groups Organization         Address  Phone             Notes  [place]. of [place] - variety of support groups  [place] Call for more information  Narcotics Anonymous (NA), Caring  Services [address] [employer] [place]  2 meetings at this location   Residential Facilities manager         Address  Phone  Notes  [facility] [place],    [place]  [phone]   [employer] [employer]  [address], [place] [address]709, [place], [place]   [place], [place] [phone] Admissions: 8am-3pm M-F  Incentives Substance [place] [address]    [place], [place] [phone]   [facility] [address] [place], [place], [place]   [facility] [address],  [place], [place]   [facility] Programs - Intensive Outpatient [address] Kristeen [address], [place], [place]   [facility] ([place].) [place].,  [place], [place] [phone] or [phone]   [facility]idential Treatment Services (RTS) [address], [place], [place] Accepts Medicaid  [facility] [place] [address],  [place] [phone] Substance Abuse/Addiction Treatment   [facility] Organization         Address  Phone  Notes  [facility]  [phone]   Domenic Schwab, PhD [address] Arlis Porta [place], [place]   [phone] or [phone]   [place], [place] [phone]   [facility] [address], [place], [place] [phone] Insurance/Medicaid/sponsorship through [facility] and Families [address] [place]                                     [place], [place] [phone] [place] [address], [place] [phone]    Dr. Adele Schilder  [phone]   [facility] Dept. 1) [address], [place] 2) [place] 3)  [address], [place] [phone]  [phone]   [place] [phone] or [phone] (After Hours)

## 2013-11-14 NOTE — ED Notes (Signed)
Per EMS: Pt coming from home c/o anxiety because he got arrested yesterday, got bailed out, and now has a court date.  Pt is now anxious because he had several warrants out.  No pain, no SI, no HI.

## 2013-11-14 NOTE — ED Provider Notes (Signed)
CSN: [id number]     Arrival date & time 11/14/13  6789 History   First MD Initiated Contact with Patient 11/14/13 1037     Chief Complaint  Patient presents with  . Anxiety     (Consider location/radiation/quality/duration/timing/severity/associated sxs/prior Treatment) HPI Comments: Patient is a 33 year old male with a past medical history of bipolar disorder and schizophrenia who presents after an anxiety attack that occurred prior to arrival. Patient reports being arrested yesterday and was bailed out. Patient has had multiple warrants out for his arrest. He now has a court date which is causing him anxiety. Patient is asymptomatic at this time. Patient also reports he has not been taking his medication for schizophrenia and bipolar disorder because they make him nauseous. No SI/HI.   Patient is a 33 y.o. male presenting with anxiety. The history is provided by the patient. No language interpreter was used.  Anxiety This is a new problem. The current episode started today. The problem occurs intermittently. The problem has been resolved. Pertinent negatives include no abdominal pain, arthralgias, chest pain, chills, fatigue, fever, nausea, neck pain, vomiting or weakness. Nothing aggravates the symptoms. He has tried nothing for the symptoms.    Past Medical History  Diagnosis Date  . Depression   . Seizures    No past surgical history on file. No family history on file. History  Substance Use Topics  . Smoking status: Former Research scientist (life sciences)  . Smokeless tobacco: Not on file  . Alcohol Use: No    Review of Systems  Constitutional: Negative for fever, chills and fatigue.  HENT: Negative for trouble swallowing.   Eyes: Negative for visual disturbance.  Respiratory: Negative for shortness of breath.   Cardiovascular: Negative for chest pain and palpitations.  Gastrointestinal: Negative for nausea, vomiting, abdominal pain and diarrhea.  Genitourinary: Negative for dysuria and difficulty  urinating.  Musculoskeletal: Negative for arthralgias and neck pain.  Skin: Negative for color change.  Neurological: Negative for dizziness and weakness.  Psychiatric/Behavioral: Negative for dysphoric mood. The patient is nervous/anxious.       Allergies  Penicillins  Home Medications   Prior to Admission medications   Medication Sig Start Date End Date Taking? Authorizing Provider  albuterol (PROVENTIL HFA;VENTOLIN HFA) 108 (90 BASE) MCG/ACT inhaler Inhale 1-2 puffs into the lungs every 6 (six) hours as needed for wheezing or shortness of breath. 04/27/13   Linus Mako, PA-C  Vitamin D, Ergocalciferol, (DRISDOL) 50000 UNITS CAPS capsule Take 50,000 Units by mouth 2 (two) times a week.    Historical Provider, MD   BP 124/69  Pulse 57  Temp(Src) 98.3 F (36.8 C) (Oral)  Resp 18  SpO2 100% Physical Exam  Nursing note and vitals reviewed. Constitutional: He is oriented to person, place, and time. He appears well-developed and well-nourished. No distress.  HENT:  Head: Normocephalic and atraumatic.  Eyes: Conjunctivae and EOM are normal.  Neck: Normal range of motion.  Cardiovascular: Normal rate and regular rhythm.  Exam reveals no gallop and no friction rub.   No murmur heard. Pulmonary/Chest: Effort normal and breath sounds normal. He has no wheezes. He has no rales. He exhibits no tenderness.  Musculoskeletal: Normal range of motion.  Neurological: He is alert and oriented to person, place, and time. Coordination normal.  Speech is goal-oriented. Moves limbs without ataxia.   Skin: Skin is warm and dry.  Psychiatric: He has a normal mood and affect. His behavior is normal.    ED Course  Procedures (  including critical care time) Labs Review Labs Reviewed - No data to display  Imaging Review No results found.   EKG Interpretation None      MDM   Final diagnoses:  Anxiety attack   11:42 AM Patient likely had a panic attack earlier related to his  current encounters with the law. Patient feeling better and has been asymptomatic for his last 4 hours in the ED. Vitals stable and patient afebrile. Patient advised to [facility] with [facility] for medication refills for his bipolar disorder and schizophrenia.     Alvina Chou, PA-C 11/14/13 1148

## 2013-11-14 NOTE — ED Provider Notes (Signed)
Medical screening examination/treatment/procedure(s) were performed by non-physician practitioner and as supervising physician I was immediately available for consultation/collaboration.   EKG Interpretation None        Blanchard Kelch, MD 11/14/13 1614

## 2013-11-15 ENCOUNTER — Ambulatory Visit (HOSPITAL_COMMUNITY)
Admission: RE | Admit: 2013-11-15 | Discharge: 2013-11-15 | Disposition: A | Discharge status: Home / Self Care | Payer: Medicaid Other | Attending: Psychiatry | Admitting: Psychiatry

## 2013-11-15 NOTE — BH Assessment (Signed)
Assessment Note  Franklin [name] is an 33 y.o. male. Pt presents voluntarily to [facility] as a walk-in accompanied by friend, Franklin [name]. Pt is quiet with an anxious affect. 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 Pt sts he was in "special ed" in school. Pt sts he moved back from four month stay in [place] last month. Pt sts he is originally from [place]. Pt denies SI and HI. He denies [facility] and no delusions noted. He reports he used to go [employer] [facility]". Pt sts he used to go to [facility] and received med management from that agency. He also says he went to [facility] but that it closed. Pt reports supportive friends. Pt sts he has never been in jail before and is scared. Pt says he has only slept a few hours since the arrest. He says he has a poor appetite also. Pt sts he lives w/ his girlfriend in a boarding house. His friend Franklin [name] provides collateral info. He reports pt was arrested 11/13/13 for breaking and entering and larceny. He says pt and he were stopped by cops 5/36/64 who ran their licenses and discovered outstanding warrant for pt. He says pt didn't realize he had a warrant.  Franklin [name] says pt has been crying a lot over the past two days.  Writer ran pt by [name] NP who agrees w/ Probation officer that pt doesn't meet criteria for inpatient placement. Pt declined [facility] and signed decline form. Writer encouraged pt to enroll again at [facility]  in order to get med management and therapy. Franklin [name] indicates he will drive pt to [facility] as soon as they leave [facility]   Axis I: Generalized Anxiety Disorder with panic attacks Axis II: Deferred Axis III:  Past Medical History  Diagnosis Date  . Depression   . Seizures    Axis IV: other psychosocial or environmental problems, problems related to legal system/crime and problems related to social environment Axis V: 51-60 moderate symptoms  Past Medical History:  Past Medical History   Diagnosis Date  . Depression   . Seizures     No past surgical history on file.  Family History: No family history on file.  Social History:  reports that he has quit smoking. He does not have any smokeless tobacco history on file. He reports that he does not drink alcohol or use illicit drugs.  Additional Social History:  Alcohol / Drug Use Pain Medications: n/a - pt denies hx of abuse Prescriptions: n/a - pt denies hx of abuse Over the Counter: n/a - pt denies hx of abuse History of alcohol / drug use?: No history of alcohol / drug abuse  CIWA:   COWS:    Allergies:  Allergies  Allergen Reactions  . Penicillins Nausea And Vomiting    Home Medications:  (Not in a hospital admission)  OB/GYN Status:  No LMP for male patient.  General Assessment Data Location of Assessment: [facility] Is this a Tele or Face-to-Face Assessment?: Face-to-Face Is this an Initial Assessment or a Re-assessment for this encounter?: Initial Assessment Living Arrangements: Spouse/significant other;Other (Comment) (w/ girlfriend in boarding house) Can pt return to current living arrangement?: Yes Admission Status: Voluntary Is patient capable of signing voluntary admission?: Yes Transfer from: Home Referral Source: Self/Family/Friend  Medical Screening Exam ([place]) Medical Exam completed: No Reason for MSE not completed: Patient Refused (signed decline form)  [place]  Care Plan Living Arrangements: Spouse/significant other;Other (Comment) (w/ girlfriend in boarding house) Name of Psychiatrist: na Name of Therapist: na  Education Status Is patient currently in school?: No Current Grade: na Highest grade of school patient has completed: 26 Name of school: [employer]  (stated he was in "special ed")  Risk to self Suicidal Ideation: No Suicidal Intent: No Is patient at risk for suicide?: No Suicidal Plan?: No Access to Means: No What has been your use of  drugs/alcohol within the last 12 months?: none Previous Attempts/Gestures: No How many times?: 0 Other Self Harm Risks: n/a Intentional Self Injurious Behavior: None Family Suicide History: Unknown Recent stressful life event(s): Other (Comment) (recently arrested and scared to go to jail) Persecutory voices/beliefs?: No Depression: No Depression Symptoms: Insomnia;Tearfulness Substance abuse history and/or treatment for substance abuse?: No Suicide prevention information given to non-admitted patients: Not applicable  Risk to Others Homicidal Ideation: No Thoughts of Harm to Others: No Current Homicidal Intent: No Current Homicidal Plan: No Access to Homicidal Means: No Identified Victim: none History of harm to others?: No Assessment of Violence: None Noted Violent Behavior Description: none - pt calm and polite Does patient have access to weapons?: No Criminal Charges Pending?: Yes Describe Pending Criminal Charges: breaking and entering, larceny Does patient have a court date: Yes Court Date: 12/20/13  Psychosis Hallucinations: None noted Delusions: None noted  Mental Status Report Appear/Hygiene: Unremarkable (in street clothes) Eye Contact: Good Motor Activity: Freedom of movement Speech: Logical/coherent;Soft Level of Consciousness: Quiet/awake Mood: Anxious;Fearful Affect: Anxious;Appropriate to circumstance Anxiety Level: Panic Attacks Panic attack frequency: twice in past two days Most recent panic attack: 11/15/13 Thought Processes: Relevant;Coherent Judgement: Partial Orientation: Person;Place;Situation;Time Obsessive Compulsive Thoughts/Behaviors: None  Cognitive Functioning Concentration: Fair Memory: Recent Intact;Remote Intact IQ: Average Insight: Fair Impulse Control: Fair Appetite: Poor Sleep: Decreased Total Hours of Sleep: 2 Vegetative Symptoms: None  ADLScreening [facility] Assessment Services) Patient's cognitive ability adequate to safely  complete daily activities?: Yes Patient able to express need for assistance with ADLs?: Yes Independently performs ADLs?: Yes (appropriate for developmental age)  Prior Inpatient Therapy Prior Inpatient Therapy: No Prior Therapy Dates: na Prior Therapy Facilty/Provider(s): na Reason for Treatment: na  Prior Outpatient Therapy Prior Outpatient Therapy: Yes Prior Therapy Dates: over past several years Prior Therapy Facilty[facility]Provider([facility] Reason for Treatment: med management, therapy for anxiety  ADL Screening (condition at time of admission) Patient's cognitive ability adequate to safely complete daily activities?: Yes Is the patient deaf or have difficulty hearing?: No Does the patient have difficulty seeing, even when wearing glasses/contacts?: No Does the patient have difficulty concentrating, remembering, or making decisions?: No Patient able to express need for assistance with ADLs?: Yes Does the patient have difficulty dressing or bathing?: Yes Independently performs ADLs?: Yes (appropriate for developmental age) Does the patient have difficulty walking or climbing stairs?: No Weakness of Legs: None Weakness of Arms/Hands: None       Abuse/Neglect Assessment (Assessment to be complete while patient is alone) Physical Abuse: Denies Verbal Abuse: Denies Sexual Abuse: Denies Exploitation of patient/patient's resources: Denies Self-Neglect: Denies     Regulatory affairs officer (For Healthcare) Advance Directive: Patient does not have advance directive;Patient would not like information    Additional Information 1:1 In Past 12 Months?: No CIRT Risk: No Elopement Risk: No Does patient have medical clearance?: No     Disposition:  Disposition Initial Assessment Completed for this Encounter: Yes Disposition of Patient: Outpatient treatment;Referred to Type  of outpatient treatment: Adult Patient referred to: Other (Comment) (pt  going to [facility] later today)  On Site Evaluation by:   Reviewed with Physician:    Leron Croak P 11/15/2013 11:31 AM

## 2014-01-01 ENCOUNTER — Emergency Department (HOSPITAL_COMMUNITY)
Admission: EM | Admit: 2014-01-01 | Discharge: 2014-01-02 | Discharge status: Against Medical Advice | Payer: Medicaid Other | Attending: Emergency Medicine | Admitting: Emergency Medicine

## 2014-01-01 ENCOUNTER — Encounter (HOSPITAL_COMMUNITY): Payer: Self-pay | Admitting: Emergency Medicine

## 2014-01-01 DIAGNOSIS — R109 Unspecified abdominal pain: Secondary | ICD-10-CM | POA: Insufficient documentation

## 2014-01-01 NOTE — ED Notes (Signed)
Pt states he is having pain in his left side   Pt states it is cramping and pain  Onset about 30 minutes ago  Denies N/V/D

## 2014-01-01 NOTE — ED Notes (Signed)
Pt called for room assignment, no response. Will try again in 5 minutes.

## 2014-01-01 NOTE — ED Notes (Signed)
Called again, No answer.

## 2014-01-02 ENCOUNTER — Encounter (HOSPITAL_COMMUNITY): Payer: Self-pay | Admitting: Emergency Medicine

## 2014-01-02 ENCOUNTER — Emergency Department (HOSPITAL_COMMUNITY)
Admission: EM | Admit: 2014-01-02 | Discharge: 2014-01-03 | Disposition: A | Discharge status: Home / Self Care | Payer: Medicaid Other | Attending: Emergency Medicine | Admitting: Emergency Medicine

## 2014-01-02 DIAGNOSIS — R569 Unspecified convulsions: Secondary | ICD-10-CM | POA: Diagnosis present

## 2014-01-02 DIAGNOSIS — Z87891 Personal history of nicotine dependence: Secondary | ICD-10-CM | POA: Diagnosis not present

## 2014-01-02 DIAGNOSIS — G40909 Epilepsy, unspecified, not intractable, without status epilepticus: Secondary | ICD-10-CM | POA: Insufficient documentation

## 2014-01-02 DIAGNOSIS — Z880 Allergy status to penicillin: Secondary | ICD-10-CM | POA: Diagnosis not present

## 2014-01-02 DIAGNOSIS — Z79899 Other long term (current) drug therapy: Secondary | ICD-10-CM | POA: Diagnosis not present

## 2014-01-02 DIAGNOSIS — Z862 Personal history of diseases of the blood and blood-forming organs and certain disorders involving the immune mechanism: Secondary | ICD-10-CM | POA: Insufficient documentation

## 2014-01-02 LAB — COMPREHENSIVE METABOLIC PANEL
ALT: 12 U/L (ref 0–53)
AST: 13 U/L (ref 0–37)
Albumin: 4.1 g/dL (ref 3.5–5.2)
Alkaline Phosphatase: 57 U/L (ref 39–117)
Anion gap: 12 (ref 5–15)
BILIRUBIN TOTAL: 1 mg/dL (ref 0.3–1.2)
BUN: 16 mg/dL (ref 6–23)
CHLORIDE: 105 meq/L (ref 96–112)
CO2: 23 mEq/L (ref 19–32)
Calcium: 9.4 mg/dL (ref 8.4–10.5)
Creatinine, Ser: 0.86 mg/dL (ref 0.50–1.35)
GFR calc Af Amer: >90 mL/min (ref >90)
GFR calc non Af Amer: >90 mL/min (ref >90)
Glucose, Bld: 100 mg/dL — ABNORMAL HIGH (ref 70–99)
Potassium: 3.9 mEq/L (ref 3.7–5.3)
SODIUM: 140 meq/L (ref 137–147)
Total Protein: 7.4 g/dL (ref 6.0–8.3)

## 2014-01-02 LAB — CBC
HCT: 41.6 % (ref 39.0–52.0)
Hemoglobin: 14.8 g/dL (ref 13.0–17.0)
MCH: 30.7 pg (ref 26.0–34.0)
MCHC: 35.6 g/dL (ref 30.0–36.0)
MCV: 86.3 fL (ref 78.0–100.0)
PLATELETS: 168 10*3/uL (ref 150–400)
RBC: 4.82 MIL/uL (ref 4.22–5.81)
RDW: 12.7 % (ref 11.5–15.5)
WBC: 7.9 10*3/uL (ref 4.0–10.5)

## 2014-01-02 LAB — CBG MONITORING, ED: Glucose-Capillary: 88 mg/dL (ref 70–99)

## 2014-01-02 LAB — VALPROIC ACID LEVEL

## 2014-01-02 NOTE — ED Provider Notes (Signed)
CSN: [id number]     Arrival date & time 01/02/14  2121 History   First MD Initiated Contact with Patient 01/02/14 2138     Chief Complaint  Patient presents with  . Seizures     (Consider location/radiation/quality/duration/timing/severity/associated sxs/prior Treatment) HPI 33 year old Franklin [name] presents with a seizure while at home. Patient states that the power went out and that he thinks he got too hot and thus had a seizure. His girlfriend witnessed the seizure. She's not available at this time. The patient relates that he was told he was out for 15 minutes. There is no shaking or seizure-like activity. This is a recurrent issue for him. He has a followup with a neurologist but has not established seizure care at this time. Denies a fevers. No headaches. No weakness or numbness. He is at his mental baseline at this time for friends at the bedside. He is currently on Depakote, has been taking as prescribed. He is unsure this for seizures or not.  Past Medical History  Diagnosis Date  . Depression   . Seizures    History reviewed. No pertinent past surgical history. No family history on file. History  Substance Use Topics  . Smoking status: Former Research scientist (life sciences)  . Smokeless tobacco: Not on file  . Alcohol Use: No    Review of Systems  Constitutional: Negative for fever.  Gastrointestinal: Negative for vomiting.  Neurological: Positive for seizures.  Psychiatric/Behavioral: Negative for confusion.  All other systems reviewed and are negative.     Allergies  Penicillins  Home Medications   Prior to Admission medications   Medication Sig Start Date End Date Taking? Authorizing Provider  albuterol (PROVENTIL HFA;VENTOLIN HFA) 108 (90 BASE) MCG/ACT inhaler Inhale 1-2 puffs into the lungs every 6 (six) hours as needed for wheezing or shortness of breath. 04/27/13   Linus Mako, PA-C  Vitamin D, Ergocalciferol, (DRISDOL) 50000 UNITS CAPS capsule Take 50,000 Units by mouth 2 (two)  times a week.    Historical Provider, MD   BP 137/93  Pulse 62  Temp(Src) 98.1 F (36.7 C) (Oral)  Resp 18  SpO2 100% Physical Exam  Nursing note and vitals reviewed. Constitutional: He is oriented to person, place, and time. He appears well-developed and well-nourished. No distress.  HENT:  Head: Normocephalic and atraumatic.  Right Ear: External ear normal.  Left Ear: External ear normal.  Nose: Nose normal.  No oral trauma  Eyes: EOM are normal. Pupils are equal, round, and reactive to light. Right eye exhibits no discharge. Left eye exhibits no discharge.  Neck: Neck supple.  Cardiovascular: Normal rate, regular rhythm, normal heart sounds and intact distal pulses.   Pulmonary/Chest: Effort normal.  Abdominal: Soft. He exhibits no distension. There is no tenderness.  Musculoskeletal: He exhibits no edema.  Neurological: He is alert and oriented to person, place, and time.  CN 2-12 grossly intact, 5/5 strength in all 4 extremities  Skin: Skin is warm and dry.    ED Course  Procedures (including critical care time) Labs Review Labs Reviewed  COMPREHENSIVE METABOLIC PANEL - Abnormal; Notable for the following:    Glucose, Bld 100 (*)    All other components within normal limits  CBC  VALPROIC ACID LEVEL  CBG MONITORING, ED    Imaging Review No results found.   EKG Interpretation   Date/Time:  Wednesday January 02 2014 21:58:47 EDT Ventricular Rate:  57 PR Interval:  166 QRS Duration: 123 QT Interval:  404 QTC Calculation: 393 R Axis:   -  22 Text Interpretation:  Sinus rhythm Nonspecific intraventricular conduction  delay No significant change since last tracing Confirmed by [place]  MD,  [place] ([id number]) on 01/02/2014 10:01:20 PM      MDM   Final diagnoses:  Seizure    Patient with syncope versus seizure. He's been seen here in the past for similar symptoms. His neurologic exam is benign. Workup is unremarkable at this time. Do not feel he needs repeat CT  imaging given he has a history of these "spells". At this time I feel he is stable for discharge, already has close outpatient f/u. Will give seizure precautions. Care transferred with depakoate level pending, I anticipate it will be benign or low in which case he would be amenable to discharge.    Ephraim Hamburger, MD 01/02/14 2322

## 2014-01-02 NOTE — ED Notes (Signed)
Patient arrives with friends A&O x4. Patient reports "the power went out and I guess I got too hot and I had a seizure". Patient denies pain/SOB, dizziness/lightheadedness at this time. Patient reports "I need some medication for my seizures but they won't give me any". Patient reports "it's been a while" since last seizure. Patient reports [facility] gave him a prescription for Depakote, which he reports he has been taking, and he has an appointment there tomorrow.

## 2014-01-03 MED ORDER — DIVALPROEX SODIUM 500 MG PO DR TAB
500.0000 mg | DELAYED_RELEASE_TABLET | Freq: Once | ORAL | Status: AC
  Administered 2014-01-03: 500 mg via ORAL
  Filled 2014-01-03: qty 1

## 2014-01-03 NOTE — ED Provider Notes (Signed)
Dr Regenia Skeeter signed out at 2330 that d/c instructions done, pt w non compliance w meds, has adequate depakote rx at home, but wants to make sure level not high before discharging and lab still pending. depakote low.  Dose given in ED. No seizure activity. Compliance w rx stressed. Pt asymptomatic, normal vital signs, and appears stable for d/c.     Mirna Mires, MD 01/03/14 [phone]

## 2014-01-03 NOTE — Discharge Instructions (Signed)
Take your seizure medication as prescribed, do not skip or miss doses. No driving, swimming, or operating heavy or dangerous machinery until cleared to do so by your doctor.  Return to ER if worse, new symptoms, recurrent seizures, fevers, other concern.     Seizure, Adult A seizure is abnormal electrical activity in the brain. Seizures usually last from 30 seconds to 2 minutes. There are various types of seizures. Before a seizure, you may have a warning sensation (aura) that a seizure is about to occur. An aura may include the following symptoms:   Fear or anxiety.  Nausea.  Feeling like the room is spinning (vertigo).  Vision changes, such as seeing flashing lights or spots. Common symptoms during a seizure include:  A change in attention or behavior (altered mental status).  Convulsions with rhythmic jerking movements.  Drooling.  Rapid eye movements.  Grunting.  Loss of bladder and bowel control.  Bitter taste in the mouth.  Tongue biting. After a seizure, you may feel confused and sleepy. You may also have an injury resulting from convulsions during the seizure. HOME CARE INSTRUCTIONS   If you are given medicines, take them exactly as prescribed by your health care provider.  Keep all follow-up appointments as directed by your health care provider.  Do not swim or drive or engage in risky activity during which a seizure could cause further injury to you or others until your health care provider says it is OK.  Get adequate rest.  Teach friends and family what to do if you have a seizure. They should:  Lay you on the ground to prevent a fall.  Put a cushion under your head.  Loosen any tight clothing around your neck.  Turn you on your side. If vomiting occurs, this helps keep your airway clear.  Stay with you until you recover.  Know whether or not you need emergency care. SEEK IMMEDIATE MEDICAL CARE IF:  The seizure lasts longer than 5  minutes.  The seizure is severe or you do not wake up immediately after the seizure.  You have an altered mental status after the seizure.  You are having more frequent or worsening seizures. Someone should drive you to the emergency department or call local emergency services (911 in [place] MAKE SURE YOU:  Understand these instructions.  Will watch your condition.  Will get help right away if you are not doing well or get worse. Document Released: 05/14/2000 Document Revised: 03/07/2013 Document Reviewed: 12/27/2012 [facility] Patient Information 2015 [place], [place]. This information is not intended to replace advice given to you by your health care provider. Make sure you discuss any questions you have with your health care provider.   Syncope Syncope is a medical term for fainting or passing out. This means you lose consciousness and drop to the ground. People are generally unconscious for less than 5 minutes. You may have some muscle twitches for up to 15 seconds before waking up and returning to normal. Syncope occurs more often in older adults, but it can happen to anyone. While most causes of syncope are not dangerous, syncope can be a sign of a serious medical problem. It is important to seek medical care.  CAUSES  Syncope is caused by a sudden drop in blood flow to the brain. The specific cause is often not determined. Factors that can bring on syncope include:  Taking medicines that lower blood pressure.  Sudden changes in posture, such as standing up quickly.  Taking more  medicine than prescribed.  Standing in one place for too long.  Seizure disorders.  Dehydration and excessive exposure to heat.  Low blood sugar (hypoglycemia).  Straining to have a bowel movement.  Heart disease, irregular heartbeat, or other circulatory problems.  Fear, emotional distress, seeing blood, or severe pain. SYMPTOMS  Right before fainting, you may:  Feel dizzy or  light-headed.  Feel nauseous.  See all white or all black in your field of vision.  Have cold, clammy skin. DIAGNOSIS  Your health care provider will ask about your symptoms, perform a physical exam, and perform an electrocardiogram (ECG) to record the electrical activity of your heart. Your health care provider may also perform other heart or blood tests to determine the cause of your syncope which may include:  Transthoracic echocardiogram (TTE). During echocardiography, sound waves are used to evaluate how blood flows through your heart.  Transesophageal echocardiogram (TEE).  Cardiac monitoring. This allows your health care provider to monitor your heart rate and rhythm in real time.  Holter monitor. This is a portable device that records your heartbeat and can help diagnose heart arrhythmias. It allows your health care provider to track your heart activity for several days, if needed.  Stress tests by exercise or by giving medicine that makes the heart beat faster. TREATMENT  In most cases, no treatment is needed. Depending on the cause of your syncope, your health care provider may recommend changing or stopping some of your medicines. HOME CARE INSTRUCTIONS  Have someone stay with you until you feel stable.  Do not drive, use machinery, or play sports until your health care provider says it is okay.  Keep all follow-up appointments as directed by your health care provider.  Lie down right away if you start feeling like you might faint. Breathe deeply and steadily. Wait until all the symptoms have passed.  Drink enough fluids to keep your urine clear or pale yellow.  If you are taking blood pressure or heart medicine, get up slowly and take several minutes to sit and then stand. This can reduce dizziness. SEEK IMMEDIATE MEDICAL CARE IF:   You have a severe headache.  You have unusual pain in the chest, abdomen, or back.  You are bleeding from your mouth or rectum, or you  have black or tarry stool.  You have an irregular or very fast heartbeat.  You have pain with breathing.  You have repeated fainting or seizure-like jerking during an episode.  You faint when sitting or lying down.  You have confusion.  You have trouble walking.  You have severe weakness.  You have vision problems. If you fainted, call your local emergency services (911 in [place] Do not drive yourself to the hospital.  MAKE SURE YOU:  Understand these instructions.  Will watch your condition.  Will get help right away if you are not doing well or get worse. Document Released: 05/17/2005 Document Revised: 05/22/2013 Document Reviewed: 07/16/2011 [facility] Patient Information 2015 [place], [place]. This information is not intended to replace advice given to you by your health care provider. Make sure you discuss any questions you have with your health care provider.

## 2014-01-15 ENCOUNTER — Emergency Department (HOSPITAL_COMMUNITY)
Admission: EM | Admit: 2014-01-15 | Discharge: 2014-01-15 | Disposition: A | Discharge status: Home / Self Care | Payer: Medicaid Other | Attending: Emergency Medicine | Admitting: Emergency Medicine

## 2014-01-15 ENCOUNTER — Encounter (HOSPITAL_COMMUNITY): Payer: Self-pay | Admitting: Emergency Medicine

## 2014-01-15 DIAGNOSIS — S239XXA Sprain of unspecified parts of thorax, initial encounter: Secondary | ICD-10-CM

## 2014-01-15 DIAGNOSIS — Z87891 Personal history of nicotine dependence: Secondary | ICD-10-CM | POA: Insufficient documentation

## 2014-01-15 DIAGNOSIS — Z8659 Personal history of other mental and behavioral disorders: Secondary | ICD-10-CM | POA: Insufficient documentation

## 2014-01-15 DIAGNOSIS — M545 Low back pain, unspecified: Secondary | ICD-10-CM | POA: Insufficient documentation

## 2014-01-15 DIAGNOSIS — X500XXA Overexertion from strenuous movement or load, initial encounter: Secondary | ICD-10-CM | POA: Insufficient documentation

## 2014-01-15 DIAGNOSIS — Y9289 Other specified places as the place of occurrence of the external cause: Secondary | ICD-10-CM | POA: Diagnosis not present

## 2014-01-15 DIAGNOSIS — Y9389 Activity, other specified: Secondary | ICD-10-CM | POA: Insufficient documentation

## 2014-01-15 DIAGNOSIS — Z880 Allergy status to penicillin: Secondary | ICD-10-CM | POA: Insufficient documentation

## 2014-01-15 DIAGNOSIS — IMO0002 Reserved for concepts with insufficient information to code with codable children: Secondary | ICD-10-CM | POA: Insufficient documentation

## 2014-01-15 MED ORDER — HYDROCODONE-ACETAMINOPHEN 5-325 MG PO TABS: 1.0000 | ORAL_TABLET | ORAL | Status: DC | PRN

## 2014-01-15 MED ORDER — NAPROXEN 500 MG PO TABS: 500.0000 mg | ORAL_TABLET | Freq: Two times a day (BID) | ORAL | Status: DC

## 2014-01-15 MED ORDER — METHOCARBAMOL 500 MG PO TABS: 500.0000 mg | ORAL_TABLET | Freq: Two times a day (BID) | ORAL | Status: DC

## 2014-01-15 NOTE — ED Notes (Signed)
Pt reports back pain, pt sts that he took a percocet from a friend and the pain went away last night, denies injury

## 2014-01-15 NOTE — ED Provider Notes (Signed)
CSN: [id number]     Arrival date & time 01/15/14  0918 History  This chart was scribed for non-physician practitioner, Dahlia Bailiff, PA-C working with Tanna Furry, MD by Frederich Balding, ED scribe. This patient was seen in room TR11C/TR11C and the patient's care was started at 10:26 AM.    Chief Complaint  Patient presents with  . Back Pain   The history is provided by the patient. No language interpreter was used.   HPI Comments: Franklin [name] is a 33 y.o. male who presents to the Emergency Department complaining of gradual onset, intermittent lower back pain that started 4 days ago. Pain does not radiate. Reports associated intermittent numbness in his lower back. Pt was heavy lifting before the pain started and thinks he might have pulled a muscle. States he did not hear a pop or snap while lifting. States he took some of his friend's percocet with temporary relief. Pt has also taken 200 mg ibuprofen and tylenol with no relief. Denies fever, nausea, emesis, bowel or bladder incontinence, tingling. Denies IV drug or alcohol use. No history of cancer.    Past Medical History  Diagnosis Date  . Depression   . Seizures    History reviewed. No pertinent past surgical history. History reviewed. No pertinent family history. History  Substance Use Topics  . Smoking status: Former Research scientist (life sciences)  . Smokeless tobacco: Not on file  . Alcohol Use: No    Review of Systems  Constitutional: Negative for fever.  Gastrointestinal: Negative for nausea and vomiting.  Genitourinary:       Negative for bowel or bladder incontinence.  Musculoskeletal: Positive for back pain.  Neurological: Positive for numbness.  All other systems reviewed and are negative.  Allergies  Penicillins  Home Medications   Prior to Admission medications   Medication Sig Start Date End Date Taking? Authorizing Provider  albuterol (PROVENTIL HFA;VENTOLIN HFA) 108 (90 BASE) MCG/ACT inhaler Inhale 1-2 puffs into the lungs every 6  (six) hours as needed for wheezing or shortness of breath. 04/27/13   Tiffany Marilu Favre, PA-C  divalproex (DEPAKOTE ER) 500 MG 24 hr tablet Take 500 mg by mouth at bedtime.    Historical Provider, MD  HYDROcodone-acetaminophen (NORCO/VICODIN) 5-325 MG per tablet Take 1 tablet by mouth every 4 (four) hours as needed. 01/15/14   Tanna Furry, MD  Lurasidone HCl (LATUDA PO) Take 20 mg by mouth at bedtime.    Historical Provider, MD  methocarbamol (ROBAXIN) 500 MG tablet Take 1 tablet (500 mg total) by mouth 2 (two) times daily. 01/15/14   Tanna Furry, MD  naproxen (NAPROSYN) 500 MG tablet Take 1 tablet (500 mg total) by mouth 2 (two) times daily. 01/15/14   Tanna Furry, MD   BP 125/70  Pulse 62  Temp(Src) 98.2 F (36.8 C) (Oral)  Resp 18  Ht 6' (1.829 m)  Wt 150 lb (68.04 kg)  BMI 20.34 kg/m2  SpO2 100%  Physical Exam  Nursing note and vitals reviewed. Constitutional: He is oriented to person, place, and time. He appears well-developed and well-nourished.  Non-toxic appearance. He does not have a sickly appearance. He does not appear ill. No distress.  HENT:  Head: Normocephalic and atraumatic.  Eyes: Conjunctivae and EOM are normal.  Neck: Neck supple.  Cardiovascular: Normal rate, regular rhythm and normal heart sounds.   No murmur heard. Pulmonary/Chest: Effort normal. No respiratory distress.  Abdominal: Soft. There is no tenderness.  Musculoskeletal: Normal range of motion.  Neurological: He is alert and  oriented to person, place, and time. He has normal strength. No cranial nerve deficit or sensory deficit. He displays a negative Romberg sign. Coordination and gait normal.  Motor strength 5 out of 5 in major muscle groups of upper and lower extremities. Patient has full active range of motion of the back with mild discomfort on range of motion.  Skin: Skin is warm and dry. He is not diaphoretic.  Psychiatric: He has a normal mood and affect. His behavior is normal.    ED Course   Procedures (including critical care time)  COORDINATION OF CARE: 10:30 AM-Discussed treatment plan which includes percocet in the ED and discharge home with 800 mg ibuprofen and a muscle relaxer with pt at bedside and pt agreed to plan. Advised pt to follow up with his PCP if symptoms do not resolve.   Labs Review Labs Reviewed - No data to display  Imaging Review No results found.   EKG Interpretation None      MDM   Final diagnoses:  Back sprain, initial encounter   11 yom with no pmhx presenting with 4 days of back pain after "lifting heavy objects Friday".  Pt denies any specific trauma to the area, and c/o R lateral thoracic muscular pain reproducible with movement and palpation.  Pt very tender to touch lateral to T4 region in muscle with no spinous process tenderness.  Neuro exam benign.    Patient with back pain.  No neurological deficits and normal neuro exam.  No loss of bowel or bladder control.  No concern for cauda equina.  No fever, night sweats, weight loss, h/o cancer, IVDU.  RICE protocol and pain medicine indicated and discussed with patient.    Signed,  Dahlia Bailiff, PA-C 6:05 PM   This patient seen and discussed with Dr. Tanna Furry, M.D. I personally performed the services described in this documentation, which was scribed in my presence. The recorded information has been reviewed and is accurate.  Carrie Mew, PA-C 01/15/14 [phone]

## 2014-01-15 NOTE — ED Provider Notes (Signed)
Patient seen and evaluated. He hurt himself trying to help pull his girlfriend up from a chair of the ground. He has some right paraspinal tenderness that is reproducible. He does not have neurological symptoms or signs or symptoms suggestive of acute herniated nucleus. I agree with prescriptions for pain, muscle relaxants, anti-inflammatories.  Tanna Furry, MD 01/15/14 [phone]

## 2014-01-15 NOTE — Discharge Instructions (Signed)
Back Pain, Adult °Back pain is very common. The pain often gets better over time. The cause of back pain is usually not dangerous. Most people can learn to manage their back pain on their own.  °HOME CARE  °· Stay active. Start with short walks on flat ground if you can. Try to walk farther each day. °· Do not sit, drive, or stand in one place for more than 30 minutes. Do not stay in bed. °· Do not avoid exercise or work. Activity can help your back heal faster. °· Be careful when you bend or lift an object. Bend at your knees, keep the object close to you, and do not twist. °· Sleep on a firm mattress. Lie on your side, and bend your knees. If you lie on your back, put a pillow under your knees. °· Only take medicines as told by your doctor. °· Put ice on the injured area. °¨ Put ice in a plastic bag. °¨ Place a towel between your skin and the bag. °¨ Leave the ice on for 15-20 minutes, 03-04 times a day for the first 2 to 3 days. After that, you can switch between ice and heat packs. °· Ask your doctor about back exercises or massage. °· Avoid feeling anxious or stressed. Find good ways to deal with stress, such as exercise. °GET HELP RIGHT AWAY IF:  °· Your pain does not go away with rest or medicine. °· Your pain does not go away in 1 week. °· You have new problems. °· You do not feel well. °· The pain spreads into your legs. °· You cannot control when you poop (bowel movement) or pee (urinate). °· Your arms or legs feel weak or lose feeling (numbness). °· You feel sick to your stomach (nauseous) or throw up (vomit). °· You have belly (abdominal) pain. °· You feel like you may pass out (faint). °MAKE SURE YOU:  °· Understand these instructions. °· Will watch your condition. °· Will get help right away if you are not doing well or get worse. °Document Released: 11/03/2007 Document Revised: 08/09/2011 Document Reviewed: 09/18/2013 °[employer]® Patient Information ©2015 [employer]. This information is not intended  to replace advice given to you by your health care provider. Make sure you discuss any questions you have with your health care provider. ° °

## 2014-01-15 NOTE — ED Notes (Signed)
MD James at the bedside  

## 2014-01-16 ENCOUNTER — Encounter (HOSPITAL_COMMUNITY): Payer: Self-pay | Admitting: Emergency Medicine

## 2014-01-16 ENCOUNTER — Emergency Department (HOSPITAL_COMMUNITY)
Admission: EM | Admit: 2014-01-16 | Discharge: 2014-01-16 | Disposition: A | Discharge status: Home / Self Care | Payer: Medicaid Other | Source: Home / Self Care | Attending: Emergency Medicine | Admitting: Emergency Medicine

## 2014-01-16 DIAGNOSIS — M79609 Pain in unspecified limb: Secondary | ICD-10-CM

## 2014-01-16 DIAGNOSIS — M79602 Pain in left arm: Secondary | ICD-10-CM

## 2014-01-16 MED ORDER — DICLOFENAC SODIUM 50 MG PO TBEC: 50.0000 mg | DELAYED_RELEASE_TABLET | Freq: Three times a day (TID) | ORAL | Status: DC

## 2014-01-16 NOTE — Discharge Instructions (Signed)

## 2014-01-16 NOTE — ED Provider Notes (Signed)
CSN: [id number]     Arrival date & time 01/16/14  0840 History   First MD Initiated Contact with Patient 01/16/14 [phone]     Chief Complaint  Patient presents with  . Arm Pain   (Consider location/radiation/quality/duration/timing/severity/associated sxs/prior Treatment) HPI Comments: 33 year old male presents complaining of left arm pain. The pain is in the proximal anterior left forearm. He says that last night his 500 pound girlfriend slept on his arm and now his arm hurts. He denies any other injury. He describes the pain as stabbing. He claims 10 out of 10 in severity, although he is laughing, smiling, joking around and in no obvious distress. No numbness in the hands. He denies any IV drug use. He was in the emergency department yesterday but at any IV was placed.  Patient is a 33 y.o. male presenting with arm pain.  Arm Pain    Past Medical History  Diagnosis Date  . Depression   . Seizures    History reviewed. No pertinent past surgical history. History reviewed. No pertinent family history. History  Substance Use Topics  . Smoking status: Former Research scientist (life sciences)  . Smokeless tobacco: Not on file  . Alcohol Use: No    Review of Systems  Musculoskeletal:       Left arm pain  All other systems reviewed and are negative.   Allergies  Penicillins  Home Medications   Prior to Admission medications   Medication Sig Start Date End Date Taking? Authorizing Provider  albuterol (PROVENTIL HFA;VENTOLIN HFA) 108 (90 BASE) MCG/ACT inhaler Inhale 1-2 puffs into the lungs every 6 (six) hours as needed for wheezing or shortness of breath. 04/27/13   Tiffany Marilu Favre, PA-C  diclofenac (VOLTAREN) 50 MG EC tablet Take 1 tablet (50 mg total) by mouth 3 (three) times daily. 01/16/14   Liam Graham, PA-C  divalproex (DEPAKOTE ER) 500 MG 24 hr tablet Take 500 mg by mouth at bedtime.    Historical Provider, MD  HYDROcodone-acetaminophen (NORCO/VICODIN) 5-325 MG per tablet Take 1 tablet by mouth  every 4 (four) hours as needed. 01/15/14   Tanna Furry, MD  Lurasidone HCl (LATUDA PO) Take 20 mg by mouth at bedtime.    Historical Provider, MD  methocarbamol (ROBAXIN) 500 MG tablet Take 1 tablet (500 mg total) by mouth 2 (two) times daily. 01/15/14   Tanna Furry, MD  naproxen (NAPROSYN) 500 MG tablet Take 1 tablet (500 mg total) by mouth 2 (two) times daily. 01/15/14   Tanna Furry, MD   BP 124/84  Pulse 52  Temp(Src) 98.4 F (36.9 C) (Oral)  Resp 14  SpO2 100% Physical Exam  Nursing note and vitals reviewed. Constitutional: He is oriented to person, place, and time. He appears well-developed and well-nourished. No distress.  HENT:  Head: Normocephalic.  Cardiovascular:  Pulses:      Radial pulses are 2+ on the right side, and 2+ on the left side.  Pulmonary/Chest: Effort normal. No respiratory distress.  Musculoskeletal:       Left elbow: He exhibits decreased range of motion (decreased flexion). He exhibits no swelling, no effusion and no deformity.       Left forearm: He exhibits tenderness. He exhibits no bony tenderness, no swelling, no edema and no deformity.       Arms: Neurological: He is alert and oriented to person, place, and time. He has normal strength. No sensory deficit. Coordination normal.  Skin: Skin is warm and dry. No rash noted. He is not diaphoretic.  Psychiatric: He has a normal mood and affect. Judgment normal.    ED Course  Procedures (including critical care time) Labs Review Labs Reviewed - No data to display  Imaging Review No results found.   MDM   1. Arm pain, anterior, left    Advised ice, when necessary diclofenac. Followup as needed   Meds ordered this encounter  Medications  . diclofenac (VOLTAREN) 50 MG EC tablet    Sig: Take 1 tablet (50 mg total) by mouth 3 (three) times daily.    Dispense:  20 tablet    Refill:  0    Order Specific Question:  Supervising Provider    Answer:  Jake Michaelis, DAVID C [[id number]]      Liam Graham,  PA-C 01/16/14 1001

## 2014-01-16 NOTE — ED Provider Notes (Signed)
Medical screening examination/treatment/procedure(s) were performed by non-physician practitioner and as supervising physician I was immediately available for consultation/collaboration.  Philipp Deputy, M.D.  Harden Mo, MD 01/16/14 1030

## 2014-01-16 NOTE — ED Notes (Signed)
C/o left arm pain States pain feels like needle pins which is radiating to left leg Movement is able but has pain

## 2014-01-20 NOTE — ED Provider Notes (Signed)
Medical screening examination/treatment/procedure(s) were conducted as a shared visit with non-physician practitioner(s) and myself.  I personally evaluated the patient during the encounter.   EKG Interpretation None      Pt with back pain for several days.  Tender to palpate.  Neurologically intact.  No signs of HNP.  I agree with disposition and plan.  Tanna Furry, MD 01/20/14 [phone]

## 2014-02-07 ENCOUNTER — Emergency Department (HOSPITAL_COMMUNITY)
Admission: EM | Admit: 2014-02-07 | Discharge: 2014-02-07 | Disposition: A | Discharge status: Home / Self Care | Payer: Medicaid Other | Attending: Emergency Medicine | Admitting: Emergency Medicine

## 2014-02-07 ENCOUNTER — Encounter (HOSPITAL_COMMUNITY): Payer: Self-pay | Admitting: Emergency Medicine

## 2014-02-07 DIAGNOSIS — Z8659 Personal history of other mental and behavioral disorders: Secondary | ICD-10-CM | POA: Diagnosis not present

## 2014-02-07 DIAGNOSIS — G40909 Epilepsy, unspecified, not intractable, without status epilepticus: Secondary | ICD-10-CM | POA: Insufficient documentation

## 2014-02-07 DIAGNOSIS — Z87891 Personal history of nicotine dependence: Secondary | ICD-10-CM | POA: Diagnosis not present

## 2014-02-07 DIAGNOSIS — Z79899 Other long term (current) drug therapy: Secondary | ICD-10-CM | POA: Insufficient documentation

## 2014-02-07 DIAGNOSIS — R569 Unspecified convulsions: Secondary | ICD-10-CM | POA: Diagnosis present

## 2014-02-07 DIAGNOSIS — Z880 Allergy status to penicillin: Secondary | ICD-10-CM | POA: Diagnosis not present

## 2014-02-07 MED ORDER — DIVALPROEX SODIUM 500 MG PO DR TAB
500.0000 mg | DELAYED_RELEASE_TABLET | Freq: Once | ORAL | Status: DC
  Filled 2014-02-07: qty 1

## 2014-02-07 NOTE — ED Notes (Signed)
Per EMS pt reports hx of seizures x4 in the past 2 days. Last seizure lasted 45 minutes happened prior to EMS arrival. Pt was able to answer all questions appropriately following seizure. Pt refused IV with EMS. Pt is alert and oriented. No seizure activity noted by EMS. Pt ran out of seizure medication yesterday.

## 2014-02-07 NOTE — Discharge Instructions (Signed)

## 2014-02-07 NOTE — ED Provider Notes (Signed)
CSN: [id number]     Arrival date & time 02/07/14  1154 History   First MD Initiated Contact with Patient 02/07/14 1321     Chief Complaint  Patient presents with  . Seizures     (Consider location/radiation/quality/duration/timing/severity/associated sxs/prior Treatment) Patient is a 33 y.o. male presenting with seizures. The history is provided by the patient and a relative. No language interpreter was used.  Seizures Seizure activity on arrival: no   Seizure type: absence? Initial focality:  None Episode characteristics: unresponsiveness   Return to baseline: yes   Timing:  Clustered Number of seizures this episode:  4 in past 2 days Progression:  Unchanged Context: medical non-compliance   Recent head injury:  No recent head injuries PTA treatment:  None History of seizures: yes (though says he has never seen a neurologist in the office)     Past Medical History  Diagnosis Date  . Depression   . Seizures    History reviewed. No pertinent past surgical history. History reviewed. No pertinent family history. History  Substance Use Topics  . Smoking status: Former Research scientist (life sciences)  . Smokeless tobacco: Not on file  . Alcohol Use: No    Review of Systems  Constitutional: Negative for fever, activity change, appetite change and fatigue.  HENT: Negative for congestion, facial swelling, rhinorrhea and trouble swallowing.   Eyes: Negative for photophobia and pain.  Respiratory: Negative for cough, chest tightness and shortness of breath.   Cardiovascular: Negative for chest pain and leg swelling.  Gastrointestinal: Negative for nausea, vomiting, abdominal pain, diarrhea and constipation.  Endocrine: Negative for polydipsia and polyuria.  Genitourinary: Negative for dysuria, urgency, decreased urine volume and difficulty urinating.  Musculoskeletal: Negative for back pain and gait problem.  Skin: Negative for color change, rash and wound.  Allergic/Immunologic: Negative for  immunocompromised state.  Neurological: Positive for seizures. Negative for dizziness, facial asymmetry, speech difficulty, weakness, numbness and headaches.  Psychiatric/Behavioral: Negative for confusion, decreased concentration and agitation.      Allergies  Penicillins  Home Medications   Prior to Admission medications   Medication Sig Start Date End Date Taking? Authorizing Provider  divalproex (DEPAKOTE ER) 500 MG 24 hr tablet Take 500 mg by mouth at bedtime.   Yes Historical Provider, MD  Lurasidone HCl (LATUDA) 20 MG TABS Take 20 mg by mouth daily.   Yes Historical Provider, MD   BP 128/86  Pulse 55  Temp(Src) 98.5 F (36.9 C) (Oral)  Resp 12  SpO2 100% Physical Exam  Constitutional: He is oriented to person, place, and time. He appears well-developed and well-nourished. No distress.  HENT:  Head: Normocephalic and atraumatic.  Mouth/Throat: No oropharyngeal exudate.  Eyes: Pupils are equal, round, and reactive to light.  Neck: Normal range of motion. Neck supple.  Cardiovascular: Normal rate, regular rhythm and normal heart sounds.  Exam reveals no gallop and no friction rub.   No murmur heard. Pulmonary/Chest: Effort normal and breath sounds normal. No respiratory distress. He has no wheezes. He has no rales.  Abdominal: Soft. Bowel sounds are normal. He exhibits no distension and no mass. There is no tenderness. There is no rebound and no guarding.  Musculoskeletal: Normal range of motion. He exhibits no edema and no tenderness.  Neurological: He is alert and oriented to person, place, and time. He has normal strength. He displays no tremor. No cranial nerve deficit or sensory deficit. He exhibits normal muscle tone. He displays a negative Romberg sign. Coordination and gait normal. GCS eye  subscore is 4. GCS verbal subscore is 5. GCS motor subscore is 6.  Skin: Skin is warm and dry.  Psychiatric: He has a normal mood and affect.    ED Course  Procedures (including  critical care time) Labs Review Labs Reviewed - No data to display  Imaging Review No results found.   EKG Interpretation None      MDM   Final diagnoses:  Seizures    Pt is a 33 y.o. male with Pmhx as above who presents with report of 4 seizures in the past 2 days. Patient's cousin states that there are episodes of staring off without any generalized shaking. He reports he hasn't taken his own Depakote and to her 3 days because he ran out. He had called and was going to give this afternoon. He states his girlfriend made him come and that he did not want to be here. He is also refusing lab draws and IV. He appears to be at his baseline mental status and has no focal neurologic deficits. He denies recent illness. He is no signs of external trauma on exam. I've explained that if I did not test his Depakote level I will not be able to tell how high her how low dose and will be under unable to give him the Depakote load. He is fine with this. We'll give him his dose of by mouth Depakote in the ED I have also strongly suggested to both the patient and his cousin that he needs to establish care with a local neurologist. Return precautions given for new or worsening symptoms including altered mental status, fever.        Ernestina Patches, MD 02/07/14 1340

## 2014-02-07 NOTE — ED Notes (Addendum)
Pt was agitated and left w/o receiving medication, discharge papers and w/o signing. MD aware.

## 2014-02-07 NOTE — ED Notes (Signed)
Bed: NL89 Expected date:  Expected time:  Means of arrival:  Comments: seizure

## 2014-02-17 ENCOUNTER — Emergency Department (HOSPITAL_COMMUNITY): Payer: Medicaid Other

## 2014-02-17 ENCOUNTER — Encounter (HOSPITAL_COMMUNITY): Payer: Self-pay | Admitting: Emergency Medicine

## 2014-02-17 ENCOUNTER — Emergency Department (HOSPITAL_COMMUNITY)
Admission: EM | Admit: 2014-02-17 | Discharge: 2014-02-18 | Disposition: A | Discharge status: Home / Self Care | Payer: Medicaid Other | Attending: Emergency Medicine | Admitting: Emergency Medicine

## 2014-02-17 DIAGNOSIS — Z87891 Personal history of nicotine dependence: Secondary | ICD-10-CM | POA: Diagnosis not present

## 2014-02-17 DIAGNOSIS — R0600 Dyspnea, unspecified: Secondary | ICD-10-CM

## 2014-02-17 DIAGNOSIS — R0602 Shortness of breath: Secondary | ICD-10-CM | POA: Insufficient documentation

## 2014-02-17 DIAGNOSIS — Z79899 Other long term (current) drug therapy: Secondary | ICD-10-CM | POA: Diagnosis not present

## 2014-02-17 DIAGNOSIS — R0682 Tachypnea, not elsewhere classified: Secondary | ICD-10-CM | POA: Insufficient documentation

## 2014-02-17 DIAGNOSIS — F329 Major depressive disorder, single episode, unspecified: Secondary | ICD-10-CM | POA: Insufficient documentation

## 2014-02-17 DIAGNOSIS — F3289 Other specified depressive episodes: Secondary | ICD-10-CM | POA: Diagnosis not present

## 2014-02-17 DIAGNOSIS — R0609 Other forms of dyspnea: Secondary | ICD-10-CM | POA: Insufficient documentation

## 2014-02-17 DIAGNOSIS — G40909 Epilepsy, unspecified, not intractable, without status epilepticus: Secondary | ICD-10-CM | POA: Insufficient documentation

## 2014-02-17 DIAGNOSIS — Z880 Allergy status to penicillin: Secondary | ICD-10-CM | POA: Diagnosis not present

## 2014-02-17 DIAGNOSIS — J3489 Other specified disorders of nose and nasal sinuses: Secondary | ICD-10-CM | POA: Diagnosis not present

## 2014-02-17 DIAGNOSIS — R0989 Other specified symptoms and signs involving the circulatory and respiratory systems: Secondary | ICD-10-CM | POA: Insufficient documentation

## 2014-02-17 NOTE — ED Notes (Signed)
Bed: GA48 Expected date: 02/17/14 Expected time: 9:13 PM Means of arrival: Ambulance Comments: Respiratory distress
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
## 2014-02-17 NOTE — ED Notes (Signed)
Per EMS, patient from home, patient reports he can not breathe when he lays down. Patient given 5mg  albuterol neb treatment en route. No wheezes noted by EMS.

## 2014-04-24 ENCOUNTER — Encounter (HOSPITAL_COMMUNITY): Payer: Self-pay | Admitting: Emergency Medicine

## 2014-04-24 ENCOUNTER — Emergency Department (HOSPITAL_COMMUNITY)
Admission: EM | Admit: 2014-04-24 | Discharge: 2014-04-24 | Disposition: A | Discharge status: Home / Self Care | Payer: Medicaid Other | Attending: Emergency Medicine | Admitting: Emergency Medicine

## 2014-04-24 DIAGNOSIS — T23012A Burn of unspecified degree of left thumb (nail), initial encounter: Secondary | ICD-10-CM | POA: Diagnosis not present

## 2014-04-24 DIAGNOSIS — Y998 Other external cause status: Secondary | ICD-10-CM | POA: Insufficient documentation

## 2014-04-24 DIAGNOSIS — Z87891 Personal history of nicotine dependence: Secondary | ICD-10-CM | POA: Insufficient documentation

## 2014-04-24 DIAGNOSIS — X19XXXA Contact with other heat and hot substances, initial encounter: Secondary | ICD-10-CM | POA: Diagnosis not present

## 2014-04-24 DIAGNOSIS — Y9289 Other specified places as the place of occurrence of the external cause: Secondary | ICD-10-CM | POA: Diagnosis not present

## 2014-04-24 DIAGNOSIS — Z79899 Other long term (current) drug therapy: Secondary | ICD-10-CM | POA: Insufficient documentation

## 2014-04-24 DIAGNOSIS — F329 Major depressive disorder, single episode, unspecified: Secondary | ICD-10-CM | POA: Insufficient documentation

## 2014-04-24 DIAGNOSIS — Z880 Allergy status to penicillin: Secondary | ICD-10-CM | POA: Diagnosis not present

## 2014-04-24 DIAGNOSIS — Y9389 Activity, other specified: Secondary | ICD-10-CM | POA: Diagnosis not present

## 2014-04-24 DIAGNOSIS — G40909 Epilepsy, unspecified, not intractable, without status epilepticus: Secondary | ICD-10-CM | POA: Insufficient documentation

## 2014-04-24 DIAGNOSIS — S6992XA Unspecified injury of left wrist, hand and finger(s), initial encounter: Secondary | ICD-10-CM | POA: Diagnosis present

## 2014-04-24 DIAGNOSIS — T300 Burn of unspecified body region, unspecified degree: Secondary | ICD-10-CM

## 2014-04-24 MED ORDER — HYDROCODONE-ACETAMINOPHEN 5-325 MG PO TABS: 1.0000 | ORAL_TABLET | ORAL | Status: DC | PRN

## 2014-04-24 MED ORDER — HYDROCODONE-ACETAMINOPHEN 5-325 MG PO TABS
1.0000 | ORAL_TABLET | Freq: Once | ORAL | Status: AC
  Administered 2014-04-24: 1 via ORAL
  Filled 2014-04-24: qty 1
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
## 2014-06-06 ENCOUNTER — Encounter (HOSPITAL_COMMUNITY): Payer: Self-pay | Admitting: Emergency Medicine

## 2014-06-06 ENCOUNTER — Emergency Department (HOSPITAL_COMMUNITY)
Admission: EM | Admit: 2014-06-06 | Discharge: 2014-06-07 | Disposition: A | Discharge status: Home / Self Care | Payer: Medicaid Other | Attending: Emergency Medicine | Admitting: Emergency Medicine

## 2014-06-06 DIAGNOSIS — Z79899 Other long term (current) drug therapy: Secondary | ICD-10-CM | POA: Diagnosis not present

## 2014-06-06 DIAGNOSIS — F329 Major depressive disorder, single episode, unspecified: Secondary | ICD-10-CM | POA: Insufficient documentation

## 2014-06-06 DIAGNOSIS — R569 Unspecified convulsions: Secondary | ICD-10-CM

## 2014-06-06 DIAGNOSIS — Z880 Allergy status to penicillin: Secondary | ICD-10-CM | POA: Diagnosis not present

## 2014-06-06 DIAGNOSIS — Z87891 Personal history of nicotine dependence: Secondary | ICD-10-CM | POA: Insufficient documentation

## 2014-06-06 DIAGNOSIS — G40909 Epilepsy, unspecified, not intractable, without status epilepticus: Secondary | ICD-10-CM | POA: Diagnosis not present

## 2014-06-06 LAB — CBC
HCT: 42.2 % (ref 39.0–52.0)
Hemoglobin: 14.7 g/dL (ref 13.0–17.0)
MCH: 29.5 pg (ref 26.0–34.0)
MCHC: 34.8 g/dL (ref 30.0–36.0)
MCV: 84.7 fL (ref 78.0–100.0)
Platelets: 183 10*3/uL (ref 150–400)
RBC: 4.98 MIL/uL (ref 4.22–5.81)
RDW: 12.6 % (ref 11.5–15.5)
WBC: 5.8 10*3/uL (ref 4.0–10.5)

## 2014-06-06 LAB — VALPROIC ACID LEVEL: Valproic Acid Lvl: <10 ug/mL — ABNORMAL LOW (ref 50.0–100.0)

## 2014-06-06 LAB — BASIC METABOLIC PANEL
Anion gap: 12 (ref 5–15)
BUN: 14 mg/dL (ref 6–23)
CO2: 19 mmol/L (ref 19–32)
Calcium: 10.2 mg/dL (ref 8.4–10.5)
Chloride: 108 mEq/L (ref 96–112)
Creatinine, Ser: 0.89 mg/dL (ref 0.50–1.35)
GFR calc Af Amer: >90 mL/min (ref >90)
GFR calc non Af Amer: >90 mL/min (ref >90)
Glucose, Bld: 105 mg/dL — ABNORMAL HIGH (ref 70–99)
Potassium: 4.1 mmol/L (ref 3.5–5.1)
Sodium: 139 mmol/L (ref 135–145)

## 2014-06-06 MED ORDER — DIVALPROEX SODIUM ER 250 MG PO TB24
250.0000 mg | ORAL_TABLET | Freq: Once | ORAL | Status: AC
  Administered 2014-06-06: 250 mg via ORAL
  Filled 2014-06-06: qty 1

## 2014-06-06 NOTE — ED Notes (Signed)
Pt. had a grand mal seizure while visiting a family in the hospital this evening witnessed by a friend , alert but non verbal at arrival . No obvious injuries .

## 2014-06-06 NOTE — ED Notes (Signed)
Dr. Kohut at bedside 

## 2014-06-06 NOTE — ED Notes (Signed)
Pt refuses to have IV access.

## 2014-06-06 NOTE — ED Notes (Signed)
Patient is out of bed and dressed to leave. Patient is unwilling to wait any longer.
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
## 2014-06-07 MED ORDER — DIVALPROEX SODIUM ER 250 MG PO TB24: 250.0000 mg | ORAL_TABLET | Freq: Every day | ORAL | Status: DC

## 2014-06-07 NOTE — Discharge Instructions (Signed)

## 2014-06-24 ENCOUNTER — Encounter (HOSPITAL_COMMUNITY): Payer: Self-pay | Admitting: Emergency Medicine

## 2014-06-24 ENCOUNTER — Emergency Department (HOSPITAL_COMMUNITY)
Admission: EM | Admit: 2014-06-24 | Discharge: 2014-06-25 | Disposition: A | Discharge status: Home / Self Care | Payer: Medicaid Other | Attending: Emergency Medicine | Admitting: Emergency Medicine

## 2014-06-24 DIAGNOSIS — G40909 Epilepsy, unspecified, not intractable, without status epilepticus: Secondary | ICD-10-CM | POA: Diagnosis not present

## 2014-06-24 DIAGNOSIS — E162 Hypoglycemia, unspecified: Secondary | ICD-10-CM | POA: Diagnosis present

## 2014-06-24 DIAGNOSIS — R42 Dizziness and giddiness: Secondary | ICD-10-CM | POA: Insufficient documentation

## 2014-06-24 DIAGNOSIS — Z79899 Other long term (current) drug therapy: Secondary | ICD-10-CM | POA: Diagnosis not present

## 2014-06-24 DIAGNOSIS — Z880 Allergy status to penicillin: Secondary | ICD-10-CM | POA: Diagnosis not present

## 2014-06-24 DIAGNOSIS — Z8659 Personal history of other mental and behavioral disorders: Secondary | ICD-10-CM | POA: Insufficient documentation

## 2014-06-24 DIAGNOSIS — Z87891 Personal history of nicotine dependence: Secondary | ICD-10-CM | POA: Diagnosis not present

## 2014-06-24 DIAGNOSIS — R4182 Altered mental status, unspecified: Secondary | ICD-10-CM | POA: Diagnosis not present

## 2014-06-24 LAB — COMPREHENSIVE METABOLIC PANEL
ALBUMIN: 4.6 g/dL (ref 3.5–5.2)
ALK PHOS: 61 U/L (ref 39–117)
ALT: 18 U/L (ref 0–53)
AST: 21 U/L (ref 0–37)
Anion gap: 8 (ref 5–15)
BILIRUBIN TOTAL: 1.4 mg/dL — AB (ref 0.3–1.2)
BUN: 12 mg/dL (ref 6–23)
CO2: 26 mmol/L (ref 19–32)
CREATININE: 0.81 mg/dL (ref 0.50–1.35)
Calcium: 9.4 mg/dL (ref 8.4–10.5)
Chloride: 109 mmol/L (ref 96–112)
GFR calc Af Amer: >90 mL/min (ref >90)
GLUCOSE: 96 mg/dL (ref 70–99)
Potassium: 4 mmol/L (ref 3.5–5.1)
Sodium: 143 mmol/L (ref 135–145)
Total Protein: 7.6 g/dL (ref 6.0–8.3)

## 2014-06-24 LAB — CBG MONITORING, ED: GLUCOSE-CAPILLARY: 101 mg/dL — AB (ref 70–99)

## 2014-06-24 LAB — CBC
HEMATOCRIT: 43.4 % (ref 39.0–52.0)
HEMOGLOBIN: 14.9 g/dL (ref 13.0–17.0)
MCH: 30.1 pg (ref 26.0–34.0)
MCHC: 34.3 g/dL (ref 30.0–36.0)
MCV: 87.7 fL (ref 78.0–100.0)
Platelets: 193 10*3/uL (ref 150–400)
RBC: 4.95 MIL/uL (ref 4.22–5.81)
RDW: 12.7 % (ref 11.5–15.5)
WBC: 6.6 10*3/uL (ref 4.0–10.5)

## 2014-06-24 NOTE — ED Notes (Signed)
Pt states that EMS came to his home about 20 minutes ago, tested his blood sugar and told him it was low. Pt states he can not remember CBG. Pt also reports he has been dizzy. Pt is alert and oriented.

## 2014-06-25 ENCOUNTER — Encounter (HOSPITAL_COMMUNITY): Payer: Self-pay

## 2014-06-25 ENCOUNTER — Emergency Department (HOSPITAL_COMMUNITY): Payer: Medicaid Other

## 2014-06-25 ENCOUNTER — Emergency Department (HOSPITAL_COMMUNITY)
Admission: EM | Admit: 2014-06-25 | Discharge: 2014-06-25 | Disposition: A | Discharge status: Home / Self Care | Payer: Medicaid Other | Attending: Emergency Medicine | Admitting: Emergency Medicine

## 2014-06-25 DIAGNOSIS — Z87891 Personal history of nicotine dependence: Secondary | ICD-10-CM | POA: Insufficient documentation

## 2014-06-25 DIAGNOSIS — Z79899 Other long term (current) drug therapy: Secondary | ICD-10-CM | POA: Insufficient documentation

## 2014-06-25 DIAGNOSIS — R51 Headache: Secondary | ICD-10-CM | POA: Diagnosis not present

## 2014-06-25 DIAGNOSIS — R4182 Altered mental status, unspecified: Secondary | ICD-10-CM

## 2014-06-25 DIAGNOSIS — Z880 Allergy status to penicillin: Secondary | ICD-10-CM | POA: Diagnosis not present

## 2014-06-25 DIAGNOSIS — G40909 Epilepsy, unspecified, not intractable, without status epilepticus: Secondary | ICD-10-CM | POA: Diagnosis not present

## 2014-06-25 DIAGNOSIS — G8911 Acute pain due to trauma: Secondary | ICD-10-CM | POA: Diagnosis not present

## 2014-06-25 DIAGNOSIS — Z8659 Personal history of other mental and behavioral disorders: Secondary | ICD-10-CM | POA: Diagnosis not present

## 2014-06-25 DIAGNOSIS — M542 Cervicalgia: Secondary | ICD-10-CM | POA: Insufficient documentation

## 2014-06-25 LAB — CBG MONITORING, ED: Glucose-Capillary: 90 mg/dL (ref 70–99)

## 2014-06-25 MED ORDER — ACETAMINOPHEN 500 MG PO TABS: 500.0000 mg | ORAL_TABLET | Freq: Four times a day (QID) | ORAL | Status: DC | PRN

## 2014-06-25 NOTE — Discharge Instructions (Signed)
We saw you in the ER for the dizziness, and low blood sugar. All the results in the ER are normal, labs and imaging. We are not sure what is causing your symptoms. The workup in the ER is not complete, and is limited to screening for life threatening and emergent conditions only, so please see a primary care doctor for further evaluation.   Dizziness Dizziness is a common problem. It is a feeling of unsteadiness or light-headedness. You may feel like you are about to faint. Dizziness can lead to injury if you stumble or fall. A person of any age group can suffer from dizziness, but dizziness is more common in older adults. CAUSES  Dizziness can be caused by many different things, including:  Middle ear problems.  Standing for too long.  Infections.  An allergic reaction.  Aging.  An emotional response to something, such as the sight of blood.  Side effects of medicines.  Tiredness.  Problems with circulation or blood pressure.  Excessive use of alcohol or medicines, or illegal drug use.  Breathing too fast (hyperventilation).  An irregular heart rhythm (arrhythmia).  A low red blood cell count (anemia).  Pregnancy.  Vomiting, diarrhea, fever, or other illnesses that cause body fluid loss (dehydration).  Diseases or conditions such as Parkinson's disease, high blood pressure (hypertension), diabetes, and thyroid problems.  Exposure to extreme heat. DIAGNOSIS  Your health care provider will ask about your symptoms, perform a physical exam, and perform an electrocardiogram (ECG) to record the electrical activity of your heart. Your health care provider may also perform other heart or blood tests to determine the cause of your dizziness. These may include:  Transthoracic echocardiogram (TTE). During echocardiography, sound waves are used to evaluate how blood flows through your heart.  Transesophageal echocardiogram (TEE).  Cardiac monitoring. This allows your health  care provider to monitor your heart rate and rhythm in real time.  Holter monitor. This is a portable device that records your heartbeat and can help diagnose heart arrhythmias. It allows your health care provider to track your heart activity for several days if needed.  Stress tests by exercise or by giving medicine that makes the heart beat faster. TREATMENT  Treatment of dizziness depends on the cause of your symptoms and can vary greatly. HOME CARE INSTRUCTIONS   Drink enough fluids to keep your urine clear or pale yellow. This is especially important in very hot weather. In older adults, it is also important in cold weather.  Take your medicine exactly as directed if your dizziness is caused by medicines. When taking blood pressure medicines, it is especially important to get up slowly.  Rise slowly from chairs and steady yourself until you feel okay.  In the morning, first sit up on the side of the bed. When you feel okay, stand slowly while holding onto something until you know your balance is fine.  Move your legs often if you need to stand in one place for a long time. Tighten and relax your muscles in your legs while standing.  Have someone stay with you for 1-2 days if dizziness continues to be a problem. Do this until you feel you are well enough to stay alone. Have the person call your health care provider if he or she notices changes in you that are concerning.  Do not drive or use heavy machinery if you feel dizzy.  Do not drink alcohol. SEEK IMMEDIATE MEDICAL CARE IF:   Your dizziness or light-headedness gets worse.  You feel nauseous or vomit.  You have problems talking, walking, or using your arms, hands, or legs.  You feel weak.  You are not thinking clearly or you have trouble forming sentences. It may take a friend or family member to notice this.  You have chest pain, abdominal pain, shortness of breath, or sweating.  Your vision changes.  You notice any  bleeding.  You have side effects from medicine that seems to be getting worse rather than better. MAKE SURE YOU:   Understand these instructions.  Will watch your condition.  Will get help right away if you are not doing well or get worse. Document Released: 11/10/2000 Document Revised: 05/22/2013 Document Reviewed: 12/04/2010 [facility] Patient Information 2015 [place], [place]. This information is not intended to replace advice given to you by your health care provider. Make sure you discuss any questions you have with your health care provider.

## 2014-06-25 NOTE — ED Provider Notes (Signed)
CSN: [id number]     Arrival date & time 06/24/14  1918 History   First MD Initiated Contact with Patient 06/25/14 0205     Chief Complaint  Patient presents with  . Hypoglycemia     (Consider location/radiation/quality/duration/timing/severity/associated sxs/prior Treatment) HPI Comments: Pt comes in with cc of dizziness. Pt has hx of seizures on depakote. Pt reports that he was with his girl friend and got dizzy and fell. He had no chest pain, dib, nausea, diaphoresis. No seizure like activity, urine incontinence or amnesia. EMS was called. His CBG was low (he thinks 60), and so he was brought to the ER. Pt is not taking insulin, does state that he hadnt eaten much today. Currently pt has no dizziness either.  Patient is a 34 y.o. male presenting with hypoglycemia. The history is provided by the patient.  Hypoglycemia Associated symptoms: dizziness   Associated symptoms: no seizures, no shortness of breath and no speech difficulty     Past Medical History  Diagnosis Date  . Depression   . Seizures    History reviewed. No pertinent past surgical history. History reviewed. No pertinent family history. History  Substance Use Topics  . Smoking status: Former Research scientist (life sciences)  . Smokeless tobacco: Not on file  . Alcohol Use: No    Review of Systems  Constitutional: Negative for activity change and appetite change.  Respiratory: Negative for cough and shortness of breath.   Cardiovascular: Negative for chest pain.  Gastrointestinal: Negative for abdominal pain.  Genitourinary: Negative for dysuria.  Neurological: Positive for dizziness and light-headedness. Negative for seizures, syncope, facial asymmetry, speech difficulty, weakness, numbness and headaches.      Allergies  Penicillins  Home Medications   Prior to Admission medications   Medication Sig Start Date End Date Taking? Authorizing Provider  divalproex (DEPAKOTE ER) 250 MG 24 hr tablet Take 1 tablet (250 mg total) by  mouth daily. 06/07/14  Yes Virgel Manifold, MD  Lurasidone HCl (LATUDA) 20 MG TABS Take 20 mg by mouth daily.   Yes Historical Provider, MD  HYDROcodone-acetaminophen (NORCO/VICODIN) 5-325 MG per tablet Take 1-2 tablets by mouth every 4 (four) hours as needed for moderate pain or severe pain. Patient not taking: Reported on 06/06/2014 04/24/14   Clayton Bibles, PA-C   BP 127/73 mmHg  Pulse 68  Temp(Src) 97.8 F (36.6 C) (Oral)  Resp 18  SpO2 100% Physical Exam  Constitutional: He is oriented to person, place, and time. He appears well-developed.  HENT:  Head: Normocephalic and atraumatic.  Eyes: Conjunctivae and EOM are normal. Pupils are equal, round, and reactive to light.  Neck: Normal range of motion. Neck supple.  Cardiovascular: Normal rate, regular rhythm and normal heart sounds.   Pulmonary/Chest: Effort normal and breath sounds normal. No respiratory distress. He has no wheezes.  Abdominal: Soft. Bowel sounds are normal. He exhibits no distension. There is no tenderness. There is no rebound and no guarding.  Neurological: He is alert and oriented to person, place, and time. No cranial nerve deficit. Coordination normal.  Skin: Skin is warm.  Nursing note and vitals reviewed.   ED Course  Procedures (including critical care time) Labs Review Labs Reviewed  COMPREHENSIVE METABOLIC PANEL - Abnormal; Notable for the following:    Total Bilirubin 1.4 (*)    All other components within normal limits  CBG MONITORING, ED - Abnormal; Notable for the following:    Glucose-Capillary 101 (*)    All other components within normal limits  CBC  CBG MONITORING, ED    Imaging Review No results found.   EKG Interpretation None      MDM   Final diagnoses:  Dizziness    Pt comes in with cc of dizziness and low blood sugar. He has hx of seizures, on depakote, but states that he can recall the entire event, and he just fell down after he got dizzy. There is no near syncope/syncope and  pt had no cardiac prodrome. No emesis, diarrhea. Neuro exam is normal, pt is asymptomatic. Initial labs are normal. He has been in the ER since 7:30 pm, i saw him at 3:00 am. Pt has not had a seizure here. Repeat CBG is normal. Will d/c.  Varney Biles, MD 06/25/14 [phone]

## 2014-06-25 NOTE — ED Provider Notes (Signed)
CSN: [id number]     Arrival date & time 06/25/14  1224 History   First MD Initiated Contact with Patient 06/25/14 1231     Chief Complaint  Patient presents with  . Altered Mental Status    HPI  Patient presents for second time in 24 hours with ongoing concern of altered mental status, head pain. The patient is oriented appropriately, but the history of present illness is provided largely by his girlfriend. Patient does not recall today's event. According to growth and the patient awoke this morning, several hours after being evaluated at our affiliated facility, with persistent confusion, disorientation, atypical behavior. According to the girlfriend, the patient was acting aggressively, violently, speaking nonsensically for some time after awakening.  He was complaining of pain on right temporal area. Currently the patient denies place, and often states that he is acting in a typical manner. She does complain of pain focally in the right temporal area, sore, severe nonradiating.  No neck pain.  The girlfriend clearly states that her and his expectation is for CT scan.  They add that since the patient hit his head yesterday, while falling from the bathtub, they have specific concern of fracture or bleed.  Past Medical History  Diagnosis Date  . Depression   . Seizures    History reviewed. No pertinent past surgical history. History reviewed. No pertinent family history. History  Substance Use Topics  . Smoking status: Former Research scientist (life sciences)  . Smokeless tobacco: Not on file  . Alcohol Use: No    Review of Systems  Constitutional:       Per HPI, otherwise negative  HENT:       Per HPI, otherwise negative  Respiratory:       Per HPI, otherwise negative  Cardiovascular:       Per HPI, otherwise negative  Gastrointestinal: Negative for vomiting.  Endocrine:       Negative aside from HPI  Genitourinary:       Neg aside from HPI   Musculoskeletal:       Per HPI, otherwise negative   Skin: Negative.   Neurological: Positive for seizures and headaches. Negative for syncope.      Allergies  Penicillins  Home Medications   Prior to Admission medications   Medication Sig Start Date End Date Taking? Authorizing Provider  divalproex (DEPAKOTE ER) 250 MG 24 hr tablet Take 1 tablet (250 mg total) by mouth daily. 06/07/14   Virgel Manifold, MD  HYDROcodone-acetaminophen (NORCO/VICODIN) 5-325 MG per tablet Take 1-2 tablets by mouth every 4 (four) hours as needed for moderate pain or severe pain. Patient not taking: Reported on 06/06/2014 04/24/14   Clayton Bibles, PA-C  Lurasidone HCl (LATUDA) 20 MG TABS Take 20 mg by mouth daily.    Historical Provider, MD   BP 126/64 mmHg  Pulse 64  Temp(Src) 98.3 F (36.8 C) (Oral)  Resp 20  SpO2 100% Physical Exam  Constitutional: He is oriented to person, place, and time. He appears well-developed. No distress.  HENT:  Head: Normocephalic and atraumatic.  No appreciable deformity, though there is tenderness to palpation throughout the right temporal area  Eyes: Conjunctivae and EOM are normal.  Neck: No tracheal deviation present. No thyromegaly present.  Neck is supple, with minimal tenderness to palpation, full range of motion.  Cardiovascular: Normal rate and regular rhythm.   Pulmonary/Chest: Effort normal. No stridor. No respiratory distress.  Abdominal: He exhibits no distension.  Musculoskeletal: He exhibits no edema.  Neurological: He is  alert and oriented to person, place, and time. No cranial nerve deficit. He exhibits normal muscle tone. Coordination normal.  Skin: Skin is warm and dry.  Psychiatric: He has a normal mood and affect.  Nursing note and vitals reviewed.   ED Course  Procedures (including critical care time) Labs Review Labs Reviewed - No data to display  Imaging Review No results found.   EKG Interpretation   Date/Time:  Tuesday June 25 2014 12:37:03 EST Ventricular Rate:  61 PR Interval:   147 QRS Duration: 124 QT Interval:  396 QTC Calculation: 399 R Axis:   -29 Text Interpretation:  Age not entered, assumed to be  34 years old for  purpose of ECG interpretation Sinus rhythm Nonspecific intraventricular  conduction delay ST elev, probable normal early repol pattern Sinus rhythm  Artifact Non-specific intra-ventricular conduction delay No significant  change since last tracing Abnormal ekg Confirmed by Carmin Muskrat  MD  [phone]) on 06/25/2014 12:43:02 PM     A review of the patient's chart demonstrates similar physical exam yesterday. MDM   Final diagnoses:  Altered mental status    Patient presents with a ongoing familial concerns of altered mental status. Patient multiple medical issues, including schizophrenia and epilepsy.  Here the patient is awake, alert, in no distress, neurologically intact.  Family's expectation is for CT scan.  Patient does have some head pain, following minor head trauma, and this was performed. This was reassuring, patient's vital signs remained reassuring, and he remained in no distress throughout his emergency department course.  He was discharged in stable condition.    Carmin Muskrat, MD 06/25/14 [phone]

## 2014-06-25 NOTE — Discharge Instructions (Signed)
As discussed, your evaluation today has been largely reassuring.  But, it is important that you monitor your condition carefully, and do not hesitate to return to the ED if you develop new, or concerning changes in your condition. ? ?Otherwise, please follow-up with your physician for appropriate ongoing care. ? ?
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
## 2014-08-18 ENCOUNTER — Emergency Department (HOSPITAL_COMMUNITY)
Admission: EM | Admit: 2014-08-18 | Discharge: 2014-08-18 | Disposition: A | Discharge status: Home / Self Care | Payer: Medicaid Other | Attending: Emergency Medicine | Admitting: Emergency Medicine

## 2014-08-18 ENCOUNTER — Encounter (HOSPITAL_COMMUNITY): Payer: Self-pay | Admitting: *Deleted

## 2014-08-18 DIAGNOSIS — Z880 Allergy status to penicillin: Secondary | ICD-10-CM | POA: Insufficient documentation

## 2014-08-18 DIAGNOSIS — Z8659 Personal history of other mental and behavioral disorders: Secondary | ICD-10-CM | POA: Insufficient documentation

## 2014-08-18 DIAGNOSIS — S6992XA Unspecified injury of left wrist, hand and finger(s), initial encounter: Secondary | ICD-10-CM | POA: Diagnosis present

## 2014-08-18 DIAGNOSIS — S60522A Blister (nonthermal) of left hand, initial encounter: Secondary | ICD-10-CM | POA: Diagnosis not present

## 2014-08-18 DIAGNOSIS — G40909 Epilepsy, unspecified, not intractable, without status epilepticus: Secondary | ICD-10-CM | POA: Insufficient documentation

## 2014-08-18 DIAGNOSIS — Z79899 Other long term (current) drug therapy: Secondary | ICD-10-CM | POA: Insufficient documentation

## 2014-08-18 DIAGNOSIS — X58XXXA Exposure to other specified factors, initial encounter: Secondary | ICD-10-CM | POA: Diagnosis not present

## 2014-08-18 DIAGNOSIS — Y92007 Garden or yard of unspecified non-institutional (private) residence as the place of occurrence of the external cause: Secondary | ICD-10-CM | POA: Diagnosis not present

## 2014-08-18 DIAGNOSIS — Z87891 Personal history of nicotine dependence: Secondary | ICD-10-CM | POA: Insufficient documentation

## 2014-08-18 DIAGNOSIS — Y998 Other external cause status: Secondary | ICD-10-CM | POA: Diagnosis not present

## 2014-08-18 DIAGNOSIS — Y9389 Activity, other specified: Secondary | ICD-10-CM | POA: Diagnosis not present

## 2014-08-18 NOTE — ED Notes (Signed)
Declined W/C at D/C and was escorted to lobby by RN. 

## 2014-08-18 NOTE — ED Notes (Signed)
Pt reports lt hand pain after mowing the yard. Pt reports he wore gloves while mowing.

## 2014-08-18 NOTE — Discharge Instructions (Signed)
Read the information below.  You may return to the Emergency Department at any time for worsening condition or any new symptoms that concern you.  If you develop redness, swelling, pus draining from the wound, or fevers greater than 100.4, return to the ER immediately for a recheck.     Blisters Blisters are fluid-filled sacs that form within the skin. Common causes of blistering are friction, burns, and exposure to irritating chemicals. The fluid in the blister protects the underlying damaged skin. Most of the time it is not recommended that you open blisters. When a blister is opened, there is an increased chance for infection. Usually, a blister will open on its own. They then dry up and peel off within 10 days. If the blister is tense and uncomfortable (painful) the fluid may be drained. If it is drained the roof of the blister should be left intact. The draining should only be done by a medical professional under aseptic conditions. Poorly fitting shoes and boots can cause blisters by being too tight or too loose. Wearing extra socks or using tape, bandages, or pads over the blister-prone area helps prevent the problem by reducing friction. Blisters heal more slowly if you have diabetes or if you have problems with your circulation. You need to be careful about medical follow-up to prevent infection. HOME CARE INSTRUCTIONS  Protect areas where blisters have formed until the skin is healed. Use a special bandage with a hole cut in the middle around the blister. This reduces pressure and friction. When the blister breaks, trim off the loose skin and keep the area clean by washing it with soap daily. Soaking the blister or broken-open blister with diluted vinegar twice daily for 15 minutes will dry it up and speed the healing. Use 3 tablespoons of white vinegar per quart of water (45 mL white vinegar per liter of water). An antibiotic ointment and a bandage can be used to cover the area after soaking.    SEEK MEDICAL CARE IF:   You develop increased redness, pain, swelling, or drainage in the blistered area.  You develop a pus-like discharge from the blistered area, chills, or a fever. MAKE SURE YOU:   Understand these instructions.  Will watch your condition.  Will get help right away if you are not doing well or get worse. Document Released: 06/24/2004 Document Revised: 08/09/2011 Document Reviewed: 05/22/2008 [facility] Patient Information 2015 [place], [place]. This information is not intended to replace advice given to you by your health care provider. Make sure you discuss any questions you have with your health care provider.

## 2014-08-18 NOTE — ED Provider Notes (Signed)
CSN: [id number]     Arrival date & time 08/18/14  0950 History  This chart was scribed for non-physician practitioner, Clayton Bibles, PA-C,  working with Francine Graven, DO, by Jeanell Sparrow, ED Scribe. This patient was seen in room TR10C/TR10C and the patient's care was started at 10:22 AM.  Chief Complaint  Patient presents with  . Hand Pain   The history is provided by the patient. No language interpreter was used.   HPI Comments: Franklin [name] is a 34 y.o. male who presents to the Emergency Department complaining of constant moderate left hand pain that started yesterday. He reports that he was mowing his lawn while wearing gloves with a push mower yesterday.  He later noticed a spot on his hand that is painful. He reports that he can move his hand, and states no modifying factors for the pain. He denies any falls, LOC, hand swelling, leg swelling, or fever.  Denies weakness or numbness.  Denies other injury.    Past Medical History  Diagnosis Date  . Depression   . Seizures    History reviewed. No pertinent past surgical history. History reviewed. No pertinent family history. History  Substance Use Topics  . Smoking status: Former Research scientist (life sciences)  . Smokeless tobacco: Not on file  . Alcohol Use: No    Review of Systems  Constitutional: Negative for fever and chills.  Cardiovascular: Negative for leg swelling.  Musculoskeletal: Negative for arthralgias.  Skin: Negative for color change, pallor and rash.  Allergic/Immunologic: Negative for immunocompromised state.  Neurological: Negative for syncope, weakness and numbness.  Hematological: Does not bruise/bleed easily.    Allergies  Penicillins  Home Medications   Prior to Admission medications   Medication Sig Start Date End Date Taking? Authorizing Provider  acetaminophen (TYLENOL) 500 MG tablet Take 1 tablet (500 mg total) by mouth every 6 (six) hours as needed. 06/25/14   Carmin Muskrat, MD  divalproex (DEPAKOTE ER) 250 MG 24  hr tablet Take 1 tablet (250 mg total) by mouth daily. 06/07/14   Virgel Manifold, MD  HYDROcodone-acetaminophen (NORCO/VICODIN) 5-325 MG per tablet Take 1-2 tablets by mouth every 4 (four) hours as needed for moderate pain or severe pain. Patient not taking: Reported on 06/06/2014 04/24/14   Clayton Bibles, PA-C  Lurasidone HCl (LATUDA) 20 MG TABS Take 20 mg by mouth daily.    Historical Provider, MD   BP 120/73 mmHg  Pulse 52  Temp(Src) 97.8 F (36.6 C) (Oral)  Resp 16  SpO2 100% Physical Exam  Constitutional: He appears well-developed and well-nourished. No distress.  HENT:  Head: Normocephalic and atraumatic.  Neck: Neck supple.  Pulmonary/Chest: Effort normal.  Neurological: He is alert.  Skin: He is not diaphoretic.  Left Hand: Single intact blister over palmar aspect of 5th MCP. Full ROM of all fingers. Sensations intact. Capillary refill is less than 2 seconds. No erythema, edema, warmth or TTP. No discharge noted.   Nursing note and vitals reviewed.   ED Course  Procedures (including critical care time) DIAGNOSTIC STUDIES: Oxygen Saturation is 100% on RA, normal by my interpretation.    COORDINATION OF CARE: 10:26 AM- Pt advised of plan for treatment and pt agrees.  Labs Review Labs Reviewed - No data to display  Imaging Review No results found.   EKG Interpretation None      MDM   Final diagnoses:  Blister of hand, left, initial encounter    Afebrile, nontoxic patient with single intact blister on the palmar aspect of  the fifth MCP of his left hand.  No bony tenderness.  Neurovascularly intact.  No e/o infection.  Did teaching and discussed wound care if blister opens, advised pt to not pop the blister himself.   D/C home with PCP follow up.  Discussed result, findings, treatment, and follow up  with patient.  Pt given return precautions.  Pt verbalizes understanding and agrees with plan.        I personally performed the services described in this documentation,  which was scribed in my presence. The recorded information has been reviewed and is accurate.     Clayton Bibles, PA-C 08/18/14 Screven, DO 08/19/14 1336

## 2014-09-13 ENCOUNTER — Emergency Department (HOSPITAL_COMMUNITY)
Admission: EM | Admit: 2014-09-13 | Discharge: 2014-09-13 | Disposition: A | Discharge status: Home / Self Care | Payer: Medicaid Other | Attending: Emergency Medicine | Admitting: Emergency Medicine

## 2014-09-13 ENCOUNTER — Encounter (HOSPITAL_COMMUNITY): Payer: Self-pay | Admitting: Emergency Medicine

## 2014-09-13 DIAGNOSIS — Z8659 Personal history of other mental and behavioral disorders: Secondary | ICD-10-CM | POA: Insufficient documentation

## 2014-09-13 DIAGNOSIS — Z87891 Personal history of nicotine dependence: Secondary | ICD-10-CM | POA: Insufficient documentation

## 2014-09-13 DIAGNOSIS — Z79899 Other long term (current) drug therapy: Secondary | ICD-10-CM | POA: Diagnosis not present

## 2014-09-13 DIAGNOSIS — Z880 Allergy status to penicillin: Secondary | ICD-10-CM | POA: Diagnosis not present

## 2014-09-13 DIAGNOSIS — R569 Unspecified convulsions: Secondary | ICD-10-CM | POA: Diagnosis not present

## 2014-09-13 LAB — CBC
HCT: 43.3 % (ref 39.0–52.0)
Hemoglobin: 14.7 g/dL (ref 13.0–17.0)
MCH: 29.9 pg (ref 26.0–34.0)
MCHC: 33.9 g/dL (ref 30.0–36.0)
MCV: 88 fL (ref 78.0–100.0)
Platelets: 176 10*3/uL (ref 150–400)
RBC: 4.92 MIL/uL (ref 4.22–5.81)
RDW: 12.6 % (ref 11.5–15.5)
WBC: 9 10*3/uL (ref 4.0–10.5)

## 2014-09-13 LAB — BASIC METABOLIC PANEL
Anion gap: 5 (ref 5–15)
BUN: 12 mg/dL (ref 6–23)
CALCIUM: 9.3 mg/dL (ref 8.4–10.5)
CO2: 28 mmol/L (ref 19–32)
CREATININE: 0.88 mg/dL (ref 0.50–1.35)
Chloride: 105 mmol/L (ref 96–112)
GFR calc Af Amer: >90 mL/min (ref >90)
GFR calc non Af Amer: >90 mL/min (ref >90)
Glucose, Bld: 86 mg/dL (ref 70–99)
Potassium: 4.1 mmol/L (ref 3.5–5.1)
Sodium: 138 mmol/L (ref 135–145)

## 2014-09-13 MED ORDER — VALPROATE SODIUM 500 MG/5ML IV SOLN
1000.0000 mg | Freq: Once | INTRAVENOUS | Status: AC
  Administered 2014-09-13: 1000 mg via INTRAVENOUS
  Filled 2014-09-13: qty 10

## 2014-09-13 MED ORDER — LORAZEPAM 2 MG/ML IJ SOLN
1.0000 mg | Freq: Once | INTRAMUSCULAR | Status: AC
  Administered 2014-09-13: 1 mg via INTRAVENOUS
  Filled 2014-09-13: qty 1

## 2014-09-13 MED ORDER — DIVALPROEX SODIUM 250 MG PO DR TAB
250.0000 mg | DELAYED_RELEASE_TABLET | Freq: Once | ORAL | Status: AC
  Administered 2014-09-13: 250 mg via ORAL
  Filled 2014-09-13: qty 1

## 2014-09-13 MED ORDER — DIVALPROEX SODIUM ER 250 MG PO TB24: 250.0000 mg | ORAL_TABLET | Freq: Every day | ORAL | Status: DC

## 2014-09-13 NOTE — ED Notes (Addendum)
Awake. Looking around room but will not speak to nurse with head favoring the lt side. Noted to be having a blank stare. Resp even and unlabored. No audible adventitious breath sounds noted. ABC's intact. Friend at bedside and reported that pt was talking prior to nurse coming into room and he acts like this after he has a seizure. Friend denies witnessing any seizure like activity. PERRLA.

## 2014-09-13 NOTE — ED Notes (Signed)
Resting quietly with eye closed. Easily arousable. Verbally responsive. Resp even and unlabored. ABC's intact. IV infusing medication per order without difficulty. Family at bedside.

## 2014-09-13 NOTE — ED Notes (Signed)
Dr. Mingo Amber at bedside and reported that pt was having a seizure and he did a sternum rub and pt responded.

## 2014-09-13 NOTE — ED Notes (Signed)
Awake. Verbally responsive. A/O x4. Resp even and unlabored. No audible adventitious breath sounds noted. ABC's intact.  

## 2014-09-13 NOTE — ED Notes (Signed)
Pt states he was sitting in a chair with his girlfriend when he had a seizure. Pt has hx of seizures and reports he has not taken his seizure medication in 3 days because he can't find it. Pt A&O x 4 at this time. C/o pain to back of neck.

## 2014-09-13 NOTE — ED Notes (Signed)
Dr. Mingo Amber at bedside. Pt OOB and dressed wanting to leave. Dr. Mingo Amber explained the importance of receiving anti-seizure medication. Pt verbalized understanding and decided to stay for medication.

## 2014-09-13 NOTE — Discharge Instructions (Signed)

## 2014-09-13 NOTE — ED Provider Notes (Signed)
CSN: [id number]     Arrival date & time 09/13/14  1715 History   First MD Initiated Contact with Patient 09/13/14 1726     Chief Complaint  Patient presents with  . Seizures     (Consider location/radiation/quality/duration/timing/severity/associated sxs/prior Treatment) Patient is a 34 y.o. male presenting with seizures. The history is provided by the patient.  Seizures Seizure activity on arrival: no   Seizure type: Abscence. Preceding symptoms: dizziness   Preceding symptoms: no sensation of an aura present   Initial focality:  None Episode characteristics: unresponsiveness   Episode characteristics: no tongue biting   Episode characteristics comment:  Staring off Postictal symptoms: confusion   Return to baseline: yes   Severity:  Moderate Duration:  25 minutes Timing:  Once Progression:  Unchanged Context: medical non-compliance (has misplaced his seizure medication)     Past Medical History  Diagnosis Date  . Depression   . Seizures    History reviewed. No pertinent past surgical history. No family history on file. History  Substance Use Topics  . Smoking status: Former Research scientist (life sciences)  . Smokeless tobacco: Not on file  . Alcohol Use: No    Review of Systems  Constitutional: Negative for fever.  Respiratory: Negative for cough and shortness of breath.   Gastrointestinal: Negative for vomiting and abdominal pain.  Neurological: Positive for seizures.  All other systems reviewed and are negative.     Allergies  Penicillins  Home Medications   Prior to Admission medications   Medication Sig Start Date End Date Taking? Authorizing Provider  divalproex (DEPAKOTE ER) 250 MG 24 hr tablet Take 1 tablet (250 mg total) by mouth daily. 06/07/14  Yes Virgel Manifold, MD  HYDROcodone-acetaminophen (NORCO/VICODIN) 5-325 MG per tablet Take 1-2 tablets by mouth every 4 (four) hours as needed for moderate pain or severe pain. Patient not taking: Reported on 06/06/2014 04/24/14    Clayton Bibles, PA-C   BP 137/71 mmHg  Pulse 65  Temp(Src) 99.1 F (37.3 C) (Oral)  Resp 16  Ht 6' (1.829 m)  Wt 145 lb (65.772 kg)  BMI 19.66 kg/m2  SpO2 100% Physical Exam  Constitutional: He is oriented to person, place, and time. He appears well-developed and well-nourished. No distress.  HENT:  Head: Normocephalic and atraumatic.  Mouth/Throat: No oropharyngeal exudate.  Eyes: EOM are normal. Pupils are equal, round, and reactive to light.  Neck: Normal range of motion. Neck supple.  Cardiovascular: Normal rate and regular rhythm.  Exam reveals no friction rub.   No murmur heard. Pulmonary/Chest: Effort normal and breath sounds normal. No respiratory distress. He has no wheezes. He has no rales.  Abdominal: Soft. He exhibits no distension. There is no tenderness. There is no rebound.  Musculoskeletal: Normal range of motion. He exhibits no edema.  Neurological: He is alert and oriented to person, place, and time. No cranial nerve deficit or sensory deficit. He exhibits normal muscle tone. Coordination normal. GCS eye subscore is 4. GCS verbal subscore is 5. GCS motor subscore is 6.  Skin: No rash noted. He is not diaphoretic.  Nursing note and vitals reviewed.   ED Course  Procedures (including critical care time) Labs Review Labs Reviewed  CBC  BASIC METABOLIC PANEL    Imaging Review No results found.   EKG Interpretation   Date/Time:  Friday September 13 2014 18:23:27 EDT Ventricular Rate:  54 PR Interval:  176 QRS Duration: 126 QT Interval:  414 QTC Calculation: 392 R Axis:   -24 Text Interpretation:  Sinus rhythm IVCD, consider atypical LBBB No  significant change since last tracing Confirmed by [facility]  MD, Nixon  ([id number]) on 09/13/2014 7:07:27 PM      MDM   Final diagnoses:  Seizure    52M here after having a seizure. He states he had roughly a 25 minute seizure while here in the hospital. He was visiting his girlfriend. He cannot find his Depakote and  hasn't taken it for 3 days. Denies fever. He's relaxing comfortably here, neurologic exam benign.  AFVSS here. Will load oral depakote, check basic labs and EKG. IV depakote loaded after another absence seizure. Feeing better, ready to go home. Given another Rx for depakote.   Evelina Bucy, MD 09/14/14 [phone]

## 2014-09-13 NOTE — ED Notes (Signed)
Pt refused EKG and blood work

## 2014-09-14 ENCOUNTER — Encounter (HOSPITAL_COMMUNITY): Payer: Self-pay | Admitting: Emergency Medicine

## 2014-09-14 ENCOUNTER — Emergency Department (HOSPITAL_COMMUNITY)
Admission: EM | Admit: 2014-09-14 | Discharge: 2014-09-14 | Disposition: A | Discharge status: Home / Self Care | Payer: Medicaid Other | Attending: Emergency Medicine | Admitting: Emergency Medicine

## 2014-09-14 DIAGNOSIS — Z87891 Personal history of nicotine dependence: Secondary | ICD-10-CM | POA: Diagnosis not present

## 2014-09-14 DIAGNOSIS — G40909 Epilepsy, unspecified, not intractable, without status epilepticus: Secondary | ICD-10-CM | POA: Diagnosis not present

## 2014-09-14 DIAGNOSIS — Z8659 Personal history of other mental and behavioral disorders: Secondary | ICD-10-CM | POA: Insufficient documentation

## 2014-09-14 DIAGNOSIS — Z880 Allergy status to penicillin: Secondary | ICD-10-CM | POA: Insufficient documentation

## 2014-09-14 DIAGNOSIS — Z590 Homelessness: Secondary | ICD-10-CM | POA: Diagnosis not present

## 2014-09-14 DIAGNOSIS — Z79899 Other long term (current) drug therapy: Secondary | ICD-10-CM | POA: Insufficient documentation

## 2014-09-14 DIAGNOSIS — R42 Dizziness and giddiness: Secondary | ICD-10-CM | POA: Insufficient documentation

## 2014-09-14 LAB — I-STAT CHEM 8, ED
BUN: 12 mg/dL (ref 6–23)
Calcium, Ion: 1.23 mmol/L (ref 1.12–1.23)
Chloride: 101 mmol/L (ref 96–112)
Creatinine, Ser: 1 mg/dL (ref 0.50–1.35)
Glucose, Bld: 100 mg/dL — ABNORMAL HIGH (ref 70–99)
HCT: 47 % (ref 39.0–52.0)
Hemoglobin: 16 g/dL (ref 13.0–17.0)
Potassium: 4.5 mmol/L (ref 3.5–5.1)
Sodium: 140 mmol/L (ref 135–145)
TCO2: 24 mmol/L (ref 0–100)

## 2014-09-14 MED ORDER — DIVALPROEX SODIUM 250 MG PO DR TAB
250.0000 mg | DELAYED_RELEASE_TABLET | Freq: Once | ORAL | Status: AC
  Administered 2014-09-14: 250 mg via ORAL
  Filled 2014-09-14: qty 1

## 2014-09-14 NOTE — ED Notes (Signed)
Seen at [facility] last night for seizure; took bus this morning to get script filled; got off bus and walked to [employer] to get an orange juice. Felt dizzy and weak. No fall,, no injury. Sat down. EMS arrived, CBG 72; they had him drink his OJ; CBG 102. Patient reports no dizziness or weakness now. Reports he did not eat breakfast. No home at this time, moving from place to place. Denies pain.

## 2014-09-14 NOTE — ED Provider Notes (Signed)
CSN: [id number]     Arrival date & time 09/14/14  0919 History   First MD Initiated Contact with Patient 09/14/14 [phone]     Chief Complaint  Patient presents with  . Dizziness     (Consider location/radiation/quality/duration/timing/severity/associated sxs/prior Treatment) Patient is a 34 y.o. male presenting with dizziness. The history is provided by the patient.  Dizziness Quality:  Lightheadedness Severity:  Mild Onset quality:  Gradual Duration: about 20-30 minutes. Timing:  Constant Progression:  Resolved Chronicity:  New Context comment:  Hadn't eaten breakfast this morning Relieved by: drinking some orange juice. Exacerbated by: walking around. Associated symptoms: no chest pain, no shortness of breath and no vomiting     Past Medical History  Diagnosis Date  . Depression   . Seizures    History reviewed. No pertinent past surgical history. History reviewed. No pertinent family history. History  Substance Use Topics  . Smoking status: Former Research scientist (life sciences)  . Smokeless tobacco: Not on file  . Alcohol Use: No    Review of Systems  Constitutional: Negative for fever.  Respiratory: Negative for cough and shortness of breath.   Cardiovascular: Negative for chest pain.  Gastrointestinal: Negative for vomiting and abdominal pain.  Neurological: Positive for dizziness.  All other systems reviewed and are negative.     Allergies  Penicillins  Home Medications   Prior to Admission medications   Medication Sig Start Date End Date Taking? Authorizing Provider  divalproex (DEPAKOTE ER) 250 MG 24 hr tablet Take 1 tablet (250 mg total) by mouth daily. 09/13/14  Yes Evelina Bucy, MD  HYDROcodone-acetaminophen (NORCO/VICODIN) 5-325 MG per tablet Take 1-2 tablets by mouth every 4 (four) hours as needed for moderate pain or severe pain. Patient not taking: Reported on 06/06/2014 04/24/14   Clayton Bibles, PA-C   BP 107/79 mmHg  Pulse 62  Temp(Src) 98.6 F (37 C) (Oral)  Resp 18   SpO2 96% Physical Exam  Constitutional: He is oriented to person, place, and time. He appears well-developed and well-nourished. No distress.  HENT:  Head: Normocephalic and atraumatic.  Mouth/Throat: No oropharyngeal exudate.  Eyes: EOM are normal. Pupils are equal, round, and reactive to light.  Neck: Normal range of motion. Neck supple.  Cardiovascular: Normal rate and regular rhythm.  Exam reveals no friction rub.   No murmur heard. Pulmonary/Chest: Effort normal and breath sounds normal. No respiratory distress. He has no wheezes. He has no rales.  Abdominal: Soft. He exhibits no distension. There is no tenderness. There is no rebound.  Musculoskeletal: Normal range of motion. He exhibits no edema.  Neurological: He is alert and oriented to person, place, and time. No cranial nerve deficit. He exhibits normal muscle tone. Coordination normal.  Skin: No rash noted. He is not diaphoretic.  Nursing note and vitals reviewed.   ED Course  Procedures (including critical care time) Labs Review Labs Reviewed  I-STAT CHEM 8, ED    Imaging Review No results found.   EKG Interpretation   Date/Time:  Saturday September 14 2014 09:25:36 EDT Ventricular Rate:  59 PR Interval:  158 QRS Duration: 116 QT Interval:  385 QTC Calculation: 381 R Axis:   -25 Text Interpretation:  Sinus rhythm Probable left atrial enlargement  Nonspecific intraventricular conduction delay ST elev, probable normal  early repol pattern No significant change since last tracing Confirmed by  Mingo Amber  MD, Shawnita Krizek ([id number]) on 09/14/2014 9:56:03 AM      MDM   Final diagnoses:  Dizziness    34 year old  male who for dizziness. Was walking to get somebody for breakfast and became dizzy. He felt better after drinking or issues. I saw patient last night for seizure and refill his Depakote because he lost it. He is homeless. Here feeling better, no more dizziness. Sugar okay with EMS 72, repeat low 100s after OJ. Here  vitals stable, neurologically intact. Not complaining of chest pain, vomiting, dizziness. Case management social work consult for patient. We'll check i-STAT Chem-8, EKG. given Depakote dose this morning. Has Rx for his depakote from last night. Eating. Stable for discharge.    Evelina Bucy, MD 09/14/14 1022

## 2014-09-14 NOTE — Discharge Instructions (Signed)
Dizziness °Dizziness is a common problem. It is a feeling of unsteadiness or light-headedness. You may feel like you are about to faint. Dizziness can lead to injury if you stumble or fall. A person of any age group can suffer from dizziness, but dizziness is more common in older adults. °CAUSES  °Dizziness can be caused by many different things, including: °· Middle ear problems. °· Standing for too long. °· Infections. °· An allergic reaction. °· Aging. °· An emotional response to something, such as the sight of blood. °· Side effects of medicines. °· Tiredness. °· Problems with circulation or blood pressure. °· Excessive use of alcohol or medicines, or illegal drug use. °· Breathing too fast (hyperventilation). °· An irregular heart rhythm (arrhythmia). °· A low red blood cell count (anemia). °· Pregnancy. °· Vomiting, diarrhea, fever, or other illnesses that cause body fluid loss (dehydration). °· Diseases or conditions such as Parkinson's disease, high blood pressure (hypertension), diabetes, and thyroid problems. °· Exposure to extreme heat. °DIAGNOSIS  °Your health care provider will ask about your symptoms, perform a physical exam, and perform an electrocardiogram (ECG) to record the electrical activity of your heart. Your health care provider may also perform other heart or blood tests to determine the cause of your dizziness. These may include: °· Transthoracic echocardiogram (TTE). During echocardiography, sound waves are used to evaluate how blood flows through your heart. °· Transesophageal echocardiogram (TEE). °· Cardiac monitoring. This allows your health care provider to monitor your heart rate and rhythm in real time. °· Holter monitor. This is a portable device that records your heartbeat and can help diagnose heart arrhythmias. It allows your health care provider to track your heart activity for several days if needed. °· Stress tests by exercise or by giving medicine that makes the heart beat  faster. °TREATMENT  °Treatment of dizziness depends on the cause of your symptoms and can vary greatly. °HOME CARE INSTRUCTIONS  °· Drink enough fluids to keep your urine clear or pale yellow. This is especially important in very hot weather. In older adults, it is also important in cold weather. °· Take your medicine exactly as directed if your dizziness is caused by medicines. When taking blood pressure medicines, it is especially important to get up slowly. °¨ Rise slowly from chairs and steady yourself until you feel okay. °¨ In the morning, first sit up on the side of the bed. When you feel okay, stand slowly while holding onto something until you know your balance is fine. °· Move your legs often if you need to stand in one place for a long time. Tighten and relax your muscles in your legs while standing. °· Have someone stay with you for 1-2 days if dizziness continues to be a problem. Do this until you feel you are well enough to stay alone. Have the person call your health care provider if he or she notices changes in you that are concerning. °· Do not drive or use heavy machinery if you feel dizzy. °· Do not drink alcohol. °SEEK IMMEDIATE MEDICAL CARE IF:  °· Your dizziness or light-headedness gets worse. °· You feel nauseous or vomit. °· You have problems talking, walking, or using your arms, hands, or legs. °· You feel weak. °· You are not thinking clearly or you have trouble forming sentences. It may take a friend or family member to notice this. °· You have chest pain, abdominal pain, shortness of breath, or sweating. °· Your vision changes. °· You notice   any bleeding. °· You have side effects from medicine that seems to be getting worse rather than better. °MAKE SURE YOU:  °· Understand these instructions. °· Will watch your condition. °· Will get help right away if you are not doing well or get worse. °Document Released: 11/10/2000 Document Revised: 05/22/2013 Document Reviewed: 12/04/2010 °ExitCare®  Patient Information ©2015 [employer]. This information is not intended to replace advice given to you by your health care provider. Make sure you discuss any questions you have with your health care provider. ° ° °Emergency Department Resource Guide °1) Find a Doctor and Pay Out of Pocket °Although you won't have to find out who is covered by your insurance plan, it is a good idea to ask around and get recommendations. You will then need to call the office and see if the doctor you have chosen will accept you as a new patient and what types of options they offer for patients who are self-pay. Some doctors offer discounts or will set up payment plans for their patients who do not have insurance, but you will need to ask so you aren't surprised when you get to your appointment. ° °2) Contact Your Local Health Department °Not all health departments have doctors that can see patients for sick visits, but many do, so it is worth a call to see if yours does. If you don't know where your local health department is, you can check in your phone book. The CDC also has a tool to help you locate your state's health department, and many state websites also have listings of all of their local health departments. ° °3) Find a Walk-in Clinic °If your illness is not likely to be very severe or complicated, you may want to try a walk in clinic. These are popping up all over the country in pharmacies, drugstores, and shopping centers. They're usually staffed by nurse practitioners or physician assistants that have been trained to treat common illnesses and complaints. They're usually fairly quick and inexpensive. However, if you have serious medical issues or chronic medical problems, these are probably not your best option. ° °No Primary Care Doctor: °- Call [facility] at  832-8000 - they can help you locate a primary care doctor that  accepts your insurance, provides certain services, etc. °- Physician Referral Service-  1-800-533-3463 ° °Chronic Pain Problems: °Organization         Address  Phone   Notes  °[facility]  (336) 297-2271 Patients need to be referred by their primary care doctor.  ° °Medication Assistance: °Organization         Address  Phone   Notes  °[place] [facility] [address] °[place], [place] [address] (336) 641-8030 --Must be a resident of [place] °-- Must have NO insurance coverage whatsoever (no Medicaid/ Medicare, etc.) °-- The pt. MUST have a primary care doctor that directs their care regularly and follows them in the community °  °[facility]  (866) 331-1348   °[employer]  (888) 892-1162   ° °Agencies that provide inexpensive medical care: °Organization         Address  Phone   Notes  °[facility]  (336) 832-8035   °[facility]    (336) 832-7272   °[facility] [address] °[place],  [address] (336) 832-4777   °[facility] [address], °[place] (336) 271-4999   °[facility]    (  336) 373-0678   °[facility]    (336) 272-1050   °[facility] ° [address], [place] Phone:  (336) 832-4444, Fax:  (336) 832-4440 Hours of Operation:  9 am - 6 pm, M-F.  Also accepts Medicaid/Medicare and self-pay.  °[facility] ° [address], [place] Phone: (336) 832-3150, Fax: (336) 832-3151. Hours of Operation:  8:30 am - 5:30 pm, M-F.  Also accepts Medicaid and self-pay.  °[facility] [place] [address], [place] Phone: (336) 878-6027   °[facility] [address], [place], [place] (336)723-1848, Ext. 123 Mondays & Thursdays: 7-9 AM.  First 15 patients are seen on a first come, first serve basis. °  ° °Medicaid-accepting [place] Providers: ° °Organization         Address  Phone   Notes  °[facility] [address],  [place] (336) 641-2100 Also accepts self-pay patients.  °[facility] [address], [place] ° (336) 856-9996   °[facility] [address], [place] (336) 288-8857   °[facility] [address] [place] [address], [place] (336) 299-7000   °Veita Bland [address], [place]  ° (336) 373-1557 Only accepts [place] Access Medicaid patients after they have their name applied to their card.  ° °Self-Pay (no insurance) in [place]: ° °Organization         Address  Phone   Notes  °Sickle Cell Patients, [facility] [address], [place] (336) 832-1970   °[facility] [address], [place] (336) 832-4400   °[facility] ° [address], [place] (336) 992-4800   °[facility]/Dr. Osei-Bonsu ° [address] [place] [address], [place] or [address], [place] (336) 841-8500 Phone number for both [place] and [place] locations is the same.  °[facility] [address], [place] (336) 299-0000   °[facility] [address] [place] [address], [place] or [address] (336) 852-7530 °(336) 878-2260   °[facility] [address], [place] (336) 350-1642, phone; (336) 294-5005, fax Sees patients 1st and 3rd Saturday of every month.  Must not qualify for public or private insurance (i.e. Medicaid, Medicare, [place] Health Choice, Veterans' Benefits) • Household income should be no more than 200% of the poverty level •The clinic cannot treat you if you are pregnant or think you are pregnant • Sexually transmitted diseases are not treated at the clinic.  ° ° °Dental Care: °Organization         Address  Phone  Notes  °[facility] [facility] [address],  (336) 641-6152 Accepts children up to age 21 who are enrolled in  Medicaid or [place] Health Choice; pregnant women with a Medicaid card; and children who have applied for Medicaid or [place] Health Choice, but were declined, whose parents can pay a reduced fee at time of service.  °[facility] [place]  [address], [place] (336) 641-7733 Accepts children up to age 21 who are enrolled in Medicaid or [place] Health Choice; pregnant women with a Medicaid card; and children who have applied for Medicaid or  Health Choice, but were declined, whose parents can pay a   reduced fee at time of service.  °[facility] ° [address], [place] (336) 641-4533 Patients are seen by appointment only. Walk-ins are not accepted. [facility] will see patients 18 years of age and older. °Monday - Tuesday (8am-5pm) °Most Wednesdays (8:30-5pm) °$30 per visit, cash only  °[facility] ° [address], [place] (336) 641-4533 Patients are seen by appointment only. Walk-ins are not accepted. [facility] will see patients 18 years of age and older. °One Wednesday Evening (Monthly: Volunteer Based).  $30 per visit, cash only  °[facility]  (919) 537-3737 for adults; Children under age 4, call Graduate Pediatric Dentistry at ([phone]) [phone]. Children aged 4-14, please call (919) [phone] to request a pediatric application. ° Dental services are provided in all areas of dental care including fillings, crowns and bridges, complete and partial dentures, implants, gum treatment, root canals, and extractions. Preventive care is also provided. Treatment is provided to both adults and children. °Patients are selected via a lottery and there is often a waiting list. °  °[facility] [address], °[place] ° (336) 763-8833 [email or web address] °  °[facility] [address], [place], [place] (336)723-1848, Ext. 123 Second and Fourth Thursday of each month, opens at 6:30  AM; Clinic ends at 9 AM.  Patients are seen on a first-come first-served basis, and a limited number are seen during each clinic.  ° °[facility] ° [address], [place], [place] (336) 723-7904   Eligibility Requirements °You must have lived in [place], [place], or [place] counties for at least the last three months. °  You cannot be eligible for state or federal sponsored healthcare insurance, including Veterans Administration, Medicaid, or Medicare. °  You generally cannot be eligible for healthcare insurance through your employer.  °  How to apply: °Eligibility screenings are held every Tuesday and Wednesday afternoon from 1:00 pm until 4:00 pm. You do not need an appointment for the interview!  °[facility] [address], [place], [place] 336-631-2330   °[facility]  336-342-8273   °[facility]  336-703-3100   °[place] [facility]  336-570-6415   ° °Behavioral Health Resources in the Community: °Intensive Outpatient Programs °Organization         Address  Phone  Notes  °[facility] [address], [place], [place] 336-878-6098   °[facility] [address], [place], [place] 336-832-9800   °[facility] [address], [place], [place] ° 336-882-2125   °[facility] [address],  °[place], [place] 1-800-853-5163 or 336-641-4981   °Substance Abuse Resources °Organization         Address  Phone  Notes  °[facility]  336-882-2125   °[facility]  336-784-9470   °[facility]  336-285-9073   °[facility]  336-845-3988   °Residential & Outpatient Substance Abuse Program  1-800-659-3381   °Psychological Services °Organization         Address  Phone  Notes  °[facility]  336- 832-9600   °[facility]  336- 378-7881   °[facility] [address], [place] 1-800-853-5163 or  336-641-4981   ° °Mobile Crisis Teams °Organization         Address  Phone  Notes  °[facility], Mobile Crisis Care Unit  1-877-626-1772   °  Assertive °[facility] ° [address] [place], [place] 336-834-9664   °Sharon DeEsch [address] °[place] 336-554-5454   ° °Self-Help/Support Groups °Organization         Address  Phone             Notes  °[facility] - variety of support groups  336- 373-1402 Call for more information  °Narcotics Anonymous (NA), [facility] [address], °[place]  2 meetings at this location  ° °Residential Treatment Programs °Organization         Address  Phone  Notes  °[facility] [address],    °[place]  1-866-801-8205   °[facility] ° [address], [place], [place] 704-293-8524   °[facility] [address], [place] 336-845-3988 Admissions: 8am-3pm M-F  °[facility] [address]    °[place], [place] 336-841-1104   °[facility] [address], [place], [place] 336-379-7146   °[facility] [address]  °[place], [place] 336-285-9073   °[facility] - Intensive Outpatient [address], [place], [place] 336-852-3033   °[facility] [address]  °[place], [place] 1-877-615-2722 or 336-784-9470   °[facility] [address] [place], [place] 336-227-7417 Accepts Medicaid  °[facility] [address]  ° [place] 1-800-659-3381 Substance Abuse/Addiction Treatment  ° °[place] Behavioral Health Resources °Organization         Address  Phone  Notes  °[facility]  (888) 581-9988   °Julie Brannon, PhD [address] [place], [place]   (336) 349-5553 or (336) 951-0000   °[facility]   [address] °[place], [place] (336) 349-4454   °[facility] [address],  [place], [place] (336) 342-8316 Insurance/Medicaid/sponsorship through [facility]  °[facility] [address]                                    [place], [place] (336) 342-8316 Therapy/tele-psych/case  °[facility] [address]  ° [place], [place] (336) 349-2233    °Dr. Arfeen  (336) 349-4544   °[facility] [place]  [employer] [place] [facility] 1) [address], [place] °2) [address], [place] °3)  [address], [place] (336) 349-3220 °(336) 342-7768 ° °(336) 342-8140   °[place] [facility] (336) 342-1394 or (336) 342-3537 (After Hours)    ° °  °

## 2014-09-15 ENCOUNTER — Encounter (HOSPITAL_COMMUNITY): Payer: Self-pay | Admitting: Cardiology

## 2014-09-15 ENCOUNTER — Emergency Department (HOSPITAL_COMMUNITY)
Admission: EM | Admit: 2014-09-15 | Discharge: 2014-09-15 | Disposition: A | Discharge status: Home / Self Care | Payer: Medicaid Other | Attending: Emergency Medicine | Admitting: Emergency Medicine

## 2014-09-15 DIAGNOSIS — Z880 Allergy status to penicillin: Secondary | ICD-10-CM | POA: Insufficient documentation

## 2014-09-15 DIAGNOSIS — E162 Hypoglycemia, unspecified: Secondary | ICD-10-CM

## 2014-09-15 DIAGNOSIS — R569 Unspecified convulsions: Secondary | ICD-10-CM

## 2014-09-15 DIAGNOSIS — F329 Major depressive disorder, single episode, unspecified: Secondary | ICD-10-CM | POA: Diagnosis not present

## 2014-09-15 DIAGNOSIS — Z79899 Other long term (current) drug therapy: Secondary | ICD-10-CM | POA: Insufficient documentation

## 2014-09-15 DIAGNOSIS — Z87891 Personal history of nicotine dependence: Secondary | ICD-10-CM | POA: Diagnosis not present

## 2014-09-15 DIAGNOSIS — G40909 Epilepsy, unspecified, not intractable, without status epilepticus: Secondary | ICD-10-CM | POA: Diagnosis not present

## 2014-09-15 LAB — MAGNESIUM: MAGNESIUM: 1.9 mg/dL (ref 1.5–2.5)

## 2014-09-15 LAB — CBG MONITORING, ED
Glucose-Capillary: 67 mg/dL — ABNORMAL LOW (ref 70–99)
Glucose-Capillary: 92 mg/dL (ref 70–99)

## 2014-09-15 LAB — BASIC METABOLIC PANEL
Anion gap: 9 (ref 5–15)
BUN: 9 mg/dL (ref 6–23)
CO2: 27 mmol/L (ref 19–32)
Calcium: 9.2 mg/dL (ref 8.4–10.5)
Chloride: 103 mmol/L (ref 96–112)
Creatinine, Ser: 0.88 mg/dL (ref 0.50–1.35)
Glucose, Bld: 85 mg/dL (ref 70–99)
Potassium: 3.3 mmol/L — ABNORMAL LOW (ref 3.5–5.1)
Sodium: 139 mmol/L (ref 135–145)

## 2014-09-15 LAB — CBC WITH DIFFERENTIAL/PLATELET
Basophils Absolute: 0 10*3/uL (ref 0.0–0.1)
Basophils Relative: 0 % (ref 0–1)
EOS ABS: 0.1 10*3/uL (ref 0.0–0.7)
Eosinophils Relative: 1 % (ref 0–5)
HCT: 42.4 % (ref 39.0–52.0)
Hemoglobin: 14.6 g/dL (ref 13.0–17.0)
Lymphocytes Relative: 17 % (ref 12–46)
Lymphs Abs: 1.6 10*3/uL (ref 0.7–4.0)
MCH: 30.4 pg (ref 26.0–34.0)
MCHC: 34.4 g/dL (ref 30.0–36.0)
MCV: 88.1 fL (ref 78.0–100.0)
MONO ABS: 0.8 10*3/uL (ref 0.1–1.0)
MONOS PCT: 8 % (ref 3–12)
NEUTROS ABS: 7.2 10*3/uL (ref 1.7–7.7)
NEUTROS PCT: 74 % (ref 43–77)
Platelets: 158 10*3/uL (ref 150–400)
RBC: 4.81 MIL/uL (ref 4.22–5.81)
RDW: 12.6 % (ref 11.5–15.5)
WBC: 9.7 10*3/uL (ref 4.0–10.5)

## 2014-09-15 MED ORDER — DIVALPROEX SODIUM 250 MG PO DR TAB
250.0000 mg | DELAYED_RELEASE_TABLET | Freq: Once | ORAL | Status: AC
  Administered 2014-09-15: 250 mg via ORAL
  Filled 2014-09-15: qty 1

## 2014-09-15 MED ORDER — LORAZEPAM 2 MG/ML IJ SOLN
1.0000 mg | Freq: Once | INTRAMUSCULAR | Status: AC
  Administered 2014-09-15: 1 mg via INTRAVENOUS
  Filled 2014-09-15: qty 1

## 2014-09-15 NOTE — Discharge Instructions (Signed)
Get your seizure medicine prescription filled and start taking it. Make sure that you get some regular meals. Return for any recurrent seizures or any new or worse symptoms.

## 2014-09-15 NOTE — ED Provider Notes (Signed)
CSN: [id number]     Arrival date & time 09/15/14  17 History   First MD Initiated Contact with Patient 09/15/14 1532     Chief Complaint  Patient presents with  . Seizures     (Consider location/radiation/quality/duration/timing/severity/associated sxs/prior Treatment) Patient is a 34 y.o. male presenting with seizures. The history is provided by the patient, the EMS personnel and a relative.  Seizures  patient seen the last 2 days in [place] for the same thing. Either hypoglycemia or seizure. Patient is out of his Depakote. Was given a prescription 2 days ago. Today he was visiting with grandmother was going to have dinner with her. Patient had seizure. Brought in by EMS. Patient is yet to have his prescription filled for the Depakote. Patient states that has not had any food at all today. Patient denies any complaints no incontinence no headache no fever no upper respiratory infection symptoms no nausea vomiting or diarrhea.  Past Medical History  Diagnosis Date  . Depression   . Seizures    History reviewed. No pertinent past surgical history. History reviewed. No pertinent family history. History  Substance Use Topics  . Smoking status: Former Research scientist (life sciences)  . Smokeless tobacco: Not on file  . Alcohol Use: No    Review of Systems  Constitutional: Negative for fever.  HENT: Negative for congestion.   Eyes: Negative for visual disturbance.  Respiratory: Negative for shortness of breath.   Cardiovascular: Negative for chest pain.  Gastrointestinal: Negative for nausea, vomiting, abdominal pain and diarrhea.  Genitourinary: Negative for dysuria.  Musculoskeletal: Negative for back pain and neck pain.  Skin: Negative for rash.  Neurological: Positive for seizures. Negative for headaches.  Hematological: Does not bruise/bleed easily.  Psychiatric/Behavioral: Negative for confusion.      Allergies  Penicillins  Home Medications   Prior to Admission medications    Medication Sig Start Date End Date Taking? Authorizing Provider  divalproex (DEPAKOTE ER) 250 MG 24 hr tablet Take 1 tablet (250 mg total) by mouth daily. 09/13/14   Evelina Bucy, MD  HYDROcodone-acetaminophen (NORCO/VICODIN) 5-325 MG per tablet Take 1-2 tablets by mouth every 4 (four) hours as needed for moderate pain or severe pain. Patient not taking: Reported on 06/06/2014 04/24/14   Clayton Bibles, PA-C   BP 124/74 mmHg  Pulse 62  Temp(Src) 99.7 F (37.6 C) (Oral)  Resp 16  Ht 6' (1.829 m)  Wt 145 lb (65.772 kg)  BMI 19.66 kg/m2  SpO2 100% Physical Exam  Constitutional: He is oriented to person, place, and time. He appears well-developed and well-nourished. No distress.  HENT:  Head: Normocephalic and atraumatic.  Mouth/Throat: Oropharynx is clear and moist.  Eyes: Conjunctivae and EOM are normal. Pupils are equal, round, and reactive to light.  Neck: Normal range of motion.  Cardiovascular: Normal rate, regular rhythm and normal heart sounds.   Pulmonary/Chest: Effort normal and breath sounds normal. No respiratory distress.  Abdominal: Soft. Bowel sounds are normal. There is no tenderness.  Musculoskeletal: Normal range of motion. He exhibits no tenderness.  Neurological: He is alert and oriented to person, place, and time. No cranial nerve deficit. He exhibits normal muscle tone. Coordination normal.  Skin: Skin is warm. No rash noted.  Nursing note and vitals reviewed.   ED Course  Procedures (including critical care time) Labs Review Labs Reviewed  BASIC METABOLIC PANEL - Abnormal; Notable for the following:    Potassium 3.3 (*)    All other components within normal limits  CBG MONITORING,  ED - Abnormal; Notable for the following:    Glucose-Capillary 67 (*)    All other components within normal limits  CBC WITH DIFFERENTIAL/PLATELET  MAGNESIUM  CBG MONITORING, ED   Results for orders placed or performed during the hospital encounter of 09/15/14  CBC with  Differential/Platelet  Result Value Ref Range   WBC 9.7 4.0 - 10.5 K/uL   RBC 4.81 4.22 - 5.81 MIL/uL   Hemoglobin 14.6 13.0 - 17.0 g/dL   HCT 42.4 39.0 - 52.0 %   MCV 88.1 78.0 - 100.0 fL   MCH 30.4 26.0 - 34.0 pg   MCHC 34.4 30.0 - 36.0 g/dL   RDW 12.6 11.5 - 15.5 %   Platelets 158 150 - 400 K/uL   Neutrophils Relative % 74 43 - 77 %   Neutro Abs 7.2 1.7 - 7.7 K/uL   Lymphocytes Relative 17 12 - 46 %   Lymphs Abs 1.6 0.7 - 4.0 K/uL   Monocytes Relative 8 3 - 12 %   Monocytes Absolute 0.8 0.1 - 1.0 K/uL   Eosinophils Relative 1 0 - 5 %   Eosinophils Absolute 0.1 0.0 - 0.7 K/uL   Basophils Relative 0 0 - 1 %   Basophils Absolute 0.0 0.0 - 0.1 K/uL  Basic metabolic panel  Result Value Ref Range   Sodium 139 135 - 145 mmol/L   Potassium 3.3 (L) 3.5 - 5.1 mmol/L   Chloride 103 96 - 112 mmol/L   CO2 27 19 - 32 mmol/L   Glucose, Bld 85 70 - 99 mg/dL   BUN 9 6 - 23 mg/dL   Creatinine, Ser 0.88 0.50 - 1.35 mg/dL   Calcium 9.2 8.4 - 10.5 mg/dL   GFR calc non Af Amer >90 >90 mL/min   GFR calc Af Amer >90 >90 mL/min   Anion gap 9 5 - 15  Magnesium  Result Value Ref Range   Magnesium 1.9 1.5 - 2.5 mg/dL  POC CBG, ED  Result Value Ref Range   Glucose-Capillary 67 (L) 70 - 99 mg/dL  POC CBG, ED  Result Value Ref Range   Glucose-Capillary 92 70 - 99 mg/dL     Imaging Review No results found.   EKG Interpretation None      MDM   Final diagnoses:  Seizure  Hypoglycemia    Patient with some mild hypoglycemia here was alert at the time. Patient apparently had witnessed seizure at home with family members. Patient given food here blood sugar improved. Lab workup negative. As noted patient has been seen twice in the last 3 days for the same thing. Patient is yet to get his Depakote prescription filled. Patient given a oral dose of Depakote here and some IV Ativan. No further seizure activity.    Patient's family says though make sure that he gets some regular meals and  they will get his prescription filled. Patient nontoxic no acute distress.    Fredia Sorrow, MD 09/15/14 1810

## 2014-09-15 NOTE — ED Notes (Signed)
MD at bedside. 

## 2014-09-15 NOTE — ED Notes (Signed)
Seizure activity today.  [facility] [place]  [facility] 15 and April 16  For same.  Given prescription for depakote which he has not filled.

## 2014-09-16 ENCOUNTER — Encounter (HOSPITAL_COMMUNITY): Payer: Self-pay | Admitting: Emergency Medicine

## 2014-09-16 ENCOUNTER — Emergency Department (HOSPITAL_COMMUNITY)
Admission: EM | Admit: 2014-09-16 | Discharge: 2014-09-16 | Discharge status: Against Medical Advice | Payer: Medicaid Other | Attending: Emergency Medicine | Admitting: Emergency Medicine

## 2014-09-16 DIAGNOSIS — R42 Dizziness and giddiness: Secondary | ICD-10-CM | POA: Insufficient documentation

## 2014-09-16 DIAGNOSIS — Z87891 Personal history of nicotine dependence: Secondary | ICD-10-CM | POA: Insufficient documentation

## 2014-09-16 NOTE — ED Notes (Signed)
Per EMS pt with Hx of schizophrenia, bipolar, homelessness, c/o dizziness onset yesterday, called EMS this morning but refused transport. Pt states he has had dizziness episodes x several months, pt states he has seizures, last seizure yesterday, pt was seen at [facility] for the same. Pt alert and oriented x 4.

## 2014-09-16 NOTE — ED Notes (Addendum)
Patient refuses to have EKG done. Reasoning for EKG explained to patient, pt continues to refuse EKG. Patient refuses to have CBG measured or blood drawn. Explained to patient that purpose of blood tests is to ensure patient is does not have any serious or life-threatening conditions that may be causing his dizziness such as a MI or hypoglycemia. Pt continues to refuse blood draw or CBG monitoring.

## 2014-09-19 ENCOUNTER — Emergency Department (HOSPITAL_COMMUNITY)
Admission: EM | Admit: 2014-09-19 | Discharge: 2014-09-19 | Disposition: A | Discharge status: Home / Self Care | Payer: Medicaid Other | Attending: Emergency Medicine | Admitting: Emergency Medicine

## 2014-09-19 ENCOUNTER — Encounter (HOSPITAL_COMMUNITY): Payer: Self-pay | Admitting: Physical Medicine and Rehabilitation

## 2014-09-19 ENCOUNTER — Emergency Department (HOSPITAL_COMMUNITY): Payer: Medicaid Other

## 2014-09-19 DIAGNOSIS — Z87891 Personal history of nicotine dependence: Secondary | ICD-10-CM | POA: Diagnosis not present

## 2014-09-19 DIAGNOSIS — F329 Major depressive disorder, single episode, unspecified: Secondary | ICD-10-CM | POA: Insufficient documentation

## 2014-09-19 DIAGNOSIS — R05 Cough: Secondary | ICD-10-CM | POA: Insufficient documentation

## 2014-09-19 DIAGNOSIS — Z880 Allergy status to penicillin: Secondary | ICD-10-CM | POA: Diagnosis not present

## 2014-09-19 DIAGNOSIS — R059 Cough, unspecified: Secondary | ICD-10-CM

## 2014-09-19 MED ORDER — BENZONATATE 100 MG PO CAPS: 100.0000 mg | ORAL_CAPSULE | Freq: Three times a day (TID) | ORAL | Status: DC

## 2014-09-19 NOTE — ED Notes (Signed)
Pt presents to department for evaluation of dry cough and midsternal chest pain. Ongoing x1 week. 4/10 chest pain upon arrival. Respirations unlabored. Pt is alert and oriented x4.

## 2014-09-19 NOTE — ED Notes (Signed)
Patient transported to X-ray 

## 2014-09-19 NOTE — ED Provider Notes (Signed)
CSN: [id number]     Arrival date & time 09/19/14  0847 History   First MD Initiated Contact with Patient 09/19/14 [phone]     Chief Complaint  Patient presents with  . Cough  . Chest Pain     (Consider location/radiation/quality/duration/timing/severity/associated sxs/prior Treatment) Patient is a 34 y.o. male presenting with cough and chest pain. The history is provided by the patient.  Cough Cough characteristics:  Non-productive Severity:  Mild Onset quality:  Gradual Duration:  1 week Timing:  Constant Progression:  Unchanged Chronicity:  New Smoker: former.   Context: upper respiratory infection   Relieved by:  Nothing Worsened by:  Nothing tried Ineffective treatments: cough syrup. Associated symptoms: chest pain   Associated symptoms: no fever, no headaches, no rhinorrhea and no shortness of breath   Chest Pain Associated symptoms: cough   Associated symptoms: no abdominal pain, no fever, no headache, no nausea, no numbness, no shortness of breath and not vomiting     Past Medical History  Diagnosis Date  . Depression   . Seizures    History reviewed. No pertinent past surgical history. History reviewed. No pertinent family history. History  Substance Use Topics  . Smoking status: Former Research scientist (life sciences)  . Smokeless tobacco: Not on file  . Alcohol Use: No    Review of Systems  Constitutional: Negative for fever.  HENT: Negative for drooling and rhinorrhea.   Eyes: Negative for pain.  Respiratory: Positive for cough. Negative for shortness of breath.   Cardiovascular: Positive for chest pain. Negative for leg swelling.  Gastrointestinal: Negative for nausea, vomiting, abdominal pain and diarrhea.  Genitourinary: Negative for dysuria and hematuria.  Musculoskeletal: Negative for gait problem and neck pain.  Skin: Negative for color change.  Neurological: Negative for numbness and headaches.  Hematological: Negative for adenopathy.  Psychiatric/Behavioral: Negative  for behavioral problems.  All other systems reviewed and are negative.     Allergies  Penicillins  Home Medications   Prior to Admission medications   Medication Sig Start Date End Date Taking? Authorizing Provider  divalproex (DEPAKOTE ER) 250 MG 24 hr tablet Take 1 tablet (250 mg total) by mouth daily. 09/13/14   Evelina Bucy, MD  HYDROcodone-acetaminophen (NORCO/VICODIN) 5-325 MG per tablet Take 1-2 tablets by mouth every 4 (four) hours as needed for moderate pain or severe pain. Patient not taking: Reported on 06/06/2014 04/24/14   Clayton Bibles, PA-C   BP 118/76 mmHg  Pulse 56  Temp(Src) 98.1 F (36.7 C) (Oral)  Resp 14  SpO2 99% Physical Exam  Constitutional: He is oriented to person, place, and time. He appears well-developed and well-nourished.  HENT:  Head: Normocephalic and atraumatic.  Right Ear: External ear normal.  Left Ear: External ear normal.  Nose: Nose normal.  Mouth/Throat: Oropharynx is clear and moist. No oropharyngeal exudate.  Eyes: Conjunctivae and EOM are normal. Pupils are equal, round, and reactive to light.  Neck: Normal range of motion. Neck supple.  Cardiovascular: Normal rate, regular rhythm, normal heart sounds and intact distal pulses.  Exam reveals no gallop and no friction rub.   No murmur heard. Pulmonary/Chest: Effort normal and breath sounds normal. No respiratory distress. He has no wheezes. He exhibits no tenderness.  Abdominal: Soft. Bowel sounds are normal. He exhibits no distension. There is no tenderness. There is no rebound and no guarding.  Musculoskeletal: Normal range of motion. He exhibits no edema or tenderness.  Neurological: He is alert and oriented to person, place, and time.  Skin: Skin  is warm and dry.  Psychiatric: He has a normal mood and affect. His behavior is normal.  Nursing note and vitals reviewed.   ED Course  Procedures (including critical care time) Labs Review Labs Reviewed - No data to display  Imaging  Review Dg Chest 2 View  09/19/2014   CLINICAL DATA:  Cough for 1 week  EXAM: CHEST  2 VIEW  COMPARISON:  02/17/2014  FINDINGS: Cardiomediastinal silhouette is stable. No acute infiltrate or pleural effusion. No pulmonary edema. Bony thorax is unremarkable.  IMPRESSION: No active cardiopulmonary disease.   Electronically Signed   By: Lahoma Crocker M.D.   On: 09/19/2014 09:54     EKG Interpretation   Date/Time:  Thursday September 19 2014 08:53:45 EDT Ventricular Rate:  58 PR Interval:  150 QRS Duration: 118 QT Interval:  393 QTC Calculation: 386 R Axis:   4 Text Interpretation:  Sinus rhythm Probable left ventricular hypertrophy  ST elev, probable normal early repol pattern No significant change since  last tracing Confirmed by Sashay Felling  MD, Shown Dissinger ([id number]) on 09/19/2014  9:05:11 AM      MDM   Final diagnoses:  Cough    9:18 AM 34 y.o. male who presents with a dry cough for about one week. He states that he has chest wall pain only with coughing. He denies any chest pain on my exam. He has no focal chest wall tenderness. Vital signs unremarkable here. He denies any fevers, vomiting, diarrhea, or shortness of breath. Clear lung fields. We'll get screening chest x-ray and EKG. Likely a viral URI.  10:29 AM: I interpreted/reviewed the labs and/or imaging which were non-contributory.   I have discussed the diagnosis/risks/treatment options with the patient and believe the pt to be eligible for discharge home to follow-up with his pcp. We also discussed returning to the ED immediately if new or worsening sx occur. We discussed the sx which are most concerning (e.g., sob, cp, fever) that necessitate immediate return. Medications administered to the patient during their visit and any new prescriptions provided to the patient are listed below.  Medications given during this visit Medications - No data to display  New Prescriptions   BENZONATATE (TESSALON) 100 MG CAPSULE    Take 1 capsule (100 mg  total) by mouth every 8 (eight) hours.     Pamella Pert, MD 09/19/14 1029

## 2014-09-19 NOTE — Discharge Instructions (Signed)
Cough, Adult   A cough is a reflex. It helps you clear your throat and airways. A cough can help heal your body. A cough can last 2 or 3 weeks (acute) or may last more than 8 weeks (chronic). Some common causes of a cough can include an infection, allergy, or a cold.  HOME CARE  · Only take medicine as told by your doctor.  · If given, take your medicines (antibiotics) as told. Finish them even if you start to feel better.  · Use a cold steam vaporizer or humidifier in your home. This can help loosen thick spit (secretions).  · Sleep so you are almost sitting up (semi-upright). Use pillows to do this. This helps reduce coughing.  · Rest as needed.  · Stop smoking if you smoke.  GET HELP RIGHT AWAY IF:  · You have yellowish-white fluid (pus) in your thick spit.  · Your cough gets worse.  · Your medicine does not reduce coughing, and you are losing sleep.  · You cough up blood.  · You have trouble breathing.  · Your pain gets worse and medicine does not help.  · You have a fever.  MAKE SURE YOU:   · Understand these instructions.  · Will watch your condition.  · Will get help right away if you are not doing well or get worse.  Document Released: 01/28/2011 Document Revised: 10/01/2013 Document Reviewed: 01/28/2011  ExitCare® Patient Information ©2015 [employer]. This information is not intended to replace advice given to you by your health care provider. Make sure you discuss any questions you have with your health care provider.

## 2014-10-09 ENCOUNTER — Encounter (HOSPITAL_COMMUNITY): Payer: Self-pay | Admitting: Emergency Medicine

## 2014-10-09 ENCOUNTER — Emergency Department (HOSPITAL_COMMUNITY): Payer: Medicaid Other

## 2014-10-09 ENCOUNTER — Emergency Department (HOSPITAL_COMMUNITY)
Admission: EM | Admit: 2014-10-09 | Discharge: 2014-10-09 | Disposition: A | Discharge status: Home / Self Care | Payer: Medicaid Other | Attending: Emergency Medicine | Admitting: Emergency Medicine

## 2014-10-09 DIAGNOSIS — Z8659 Personal history of other mental and behavioral disorders: Secondary | ICD-10-CM | POA: Diagnosis not present

## 2014-10-09 DIAGNOSIS — Z79899 Other long term (current) drug therapy: Secondary | ICD-10-CM | POA: Diagnosis not present

## 2014-10-09 DIAGNOSIS — Z880 Allergy status to penicillin: Secondary | ICD-10-CM | POA: Diagnosis not present

## 2014-10-09 DIAGNOSIS — Z87891 Personal history of nicotine dependence: Secondary | ICD-10-CM | POA: Diagnosis not present

## 2014-10-09 DIAGNOSIS — R0789 Other chest pain: Secondary | ICD-10-CM | POA: Insufficient documentation

## 2014-10-09 DIAGNOSIS — R079 Chest pain, unspecified: Secondary | ICD-10-CM | POA: Diagnosis present

## 2014-10-09 DIAGNOSIS — G40909 Epilepsy, unspecified, not intractable, without status epilepticus: Secondary | ICD-10-CM | POA: Insufficient documentation

## 2014-10-09 LAB — CBG MONITORING, ED: GLUCOSE-CAPILLARY: 82 mg/dL (ref 70–99)

## 2014-10-09 LAB — CBC
HCT: 43 % (ref 39.0–52.0)
HEMOGLOBIN: 14.8 g/dL (ref 13.0–17.0)
MCH: 29.5 pg (ref 26.0–34.0)
MCHC: 34.4 g/dL (ref 30.0–36.0)
MCV: 85.8 fL (ref 78.0–100.0)
PLATELETS: 172 10*3/uL (ref 150–400)
RBC: 5.01 MIL/uL (ref 4.22–5.81)
RDW: 12.5 % (ref 11.5–15.5)
WBC: 6.1 10*3/uL (ref 4.0–10.5)

## 2014-10-09 LAB — BASIC METABOLIC PANEL
Anion gap: 9 (ref 5–15)
BUN: 12 mg/dL (ref 6–20)
CALCIUM: 9.3 mg/dL (ref 8.9–10.3)
CO2: 25 mmol/L (ref 22–32)
Chloride: 106 mmol/L (ref 101–111)
Creatinine, Ser: 0.89 mg/dL (ref 0.61–1.24)
GFR calc Af Amer: >60 mL/min (ref >60)
Glucose, Bld: 81 mg/dL (ref 70–99)
POTASSIUM: 3.7 mmol/L (ref 3.5–5.1)
SODIUM: 140 mmol/L (ref 135–145)

## 2014-10-09 LAB — I-STAT TROPONIN, ED: Troponin i, poc: 0 ng/mL (ref 0.00–0.08)

## 2014-10-09 MED ORDER — SODIUM CHLORIDE 0.9 % IV BOLUS (SEPSIS)
1000.0000 mL | Freq: Once | INTRAVENOUS | Status: AC
  Administered 2014-10-09: 1000 mL via INTRAVENOUS

## 2014-10-09 NOTE — ED Notes (Signed)
Rainbow including light blue sent down to main lab at this time.

## 2014-10-09 NOTE — ED Provider Notes (Signed)
CSN: [id number]     Arrival date & time 10/09/14  1922 History   First MD Initiated Contact with Patient 10/09/14 1937     Chief Complaint  Patient presents with  . Chest Pain     (Consider location/radiation/quality/duration/timing/severity/associated sxs/prior Treatment) HPI Patient presents after an episode of chest pain. Patient was walking earlier today, felt chest pain. Pain resolved with rest. There was no dyspnea, no syncope. There was mild general weakness. Patient now has returned to baseline, according to him. Patient had an episode of back pain yesterday, but otherwise has been in his usual state of health recently. Patient takes Depakote, regularly for seizure prevention, denies other medication use. Patient is a former smoker, does not drink. Patient currently denies any dyspnea, chest pain.  Past Medical History  Diagnosis Date  . Depression   . Seizures    History reviewed. No pertinent past surgical history. No family history on file. History  Substance Use Topics  . Smoking status: Former Research scientist (life sciences)  . Smokeless tobacco: Not on file  . Alcohol Use: No    Review of Systems  Constitutional:       Per HPI, otherwise negative  HENT:       Per HPI, otherwise negative  Respiratory:       Per HPI, otherwise negative  Cardiovascular:       Per HPI, otherwise negative  Gastrointestinal: Negative for vomiting.  Endocrine:       Negative aside from HPI  Genitourinary:       Neg aside from HPI   Musculoskeletal:       Per HPI, otherwise negative  Skin: Negative.   Neurological: Negative for syncope.      Allergies  Penicillins  Home Medications   Prior to Admission medications   Medication Sig Start Date End Date Taking? Authorizing Provider  divalproex (DEPAKOTE ER) 250 MG 24 hr tablet Take 1 tablet (250 mg total) by mouth daily. 09/13/14  Yes Evelina Bucy, MD  benzonatate (TESSALON) 100 MG capsule Take 1 capsule (100 mg total) by mouth every 8  (eight) hours. 09/19/14   Pamella Pert, MD  HYDROcodone-acetaminophen (NORCO/VICODIN) 5-325 MG per tablet Take 1-2 tablets by mouth every 4 (four) hours as needed for moderate pain or severe pain. Patient not taking: Reported on 06/06/2014 04/24/14   Clayton Bibles, PA-C   BP 127/76 mmHg  Pulse 73  Temp(Src) 99.1 F (37.3 C) (Oral)  Resp 12  Ht 6' (1.829 m)  Wt 145 lb (65.772 kg)  BMI 19.66 kg/m2  SpO2 98% Physical Exam  Constitutional: He is oriented to person, place, and time. He appears well-developed. No distress.  HENT:  Head: Normocephalic and atraumatic.  Eyes: Conjunctivae and EOM are normal.  Cardiovascular: Normal rate and regular rhythm.   Pulmonary/Chest: Effort normal. No stridor. No respiratory distress.  Abdominal: He exhibits no distension.  Musculoskeletal: He exhibits no edema.  Neurological: He is alert and oriented to person, place, and time.  Skin: Skin is warm and dry.  Psychiatric: He has a normal mood and affect.  Nursing note and vitals reviewed.   ED Course  Procedures (including critical care time) Labs Review Labs Reviewed  BASIC METABOLIC PANEL  CBC  I-STAT [place], ED  CBG MONITORING, ED    Imaging Review Dg Chest Port 1 View  10/09/2014   CLINICAL DATA:  Central chest pain for 1 day.  Tobacco use.  EXAM: PORTABLE CHEST - 1 VIEW  COMPARISON:  09/19/2014  FINDINGS: Cardiothoracic  index 61%, mildly prominent for AP imaging. No edema. The lungs appear clear. No pleural effusion identified.  IMPRESSION: 1. Mild enlargement of the cardiopericardial silhouette, without edema.   Electronically Signed   By: Van Clines M.D.   On: 10/09/2014 20:08     EKG Interpretation   Date/Time:  Wednesday Oct 09 2014 19:22:54 EDT Ventricular Rate:  70 PR Interval:  187 QRS Duration: 114 QT Interval:  390 QTC Calculation: 421 R Axis:   -20 Text Interpretation:  Sinus rhythm RSR' in V1 or V2, probably normal  variant Probable left ventricular  hypertrophy ST elev, probable normal  early repol pattern Sinus rhythm Artifact Left ventricular hypertrophy RSR  prime No significant change since last tracing Abnormal ekg Confirmed by  Carmin Muskrat  MD [phone]) on 10/09/2014 7:26:07 PM     Chart review demonstrates 11 prior visits within 6 months. On repeat exam the patient is generally well-appearing, vital signs remained normal. We discussed all findings, return precautions, follow-up instructions.  MDM   Patient presents after an episode of chest pain, now resolved. Patient is awake, alert, hemodynamically stable for hours of monitoring here. No evidence for imminent decompensation, no evidence for atypical ACS, no evidence for pulmonary embolism, or occult infection. Patient was discharged in stable condition to follow-up with primary care. Patient has a scheduled primary care appointment in 4 days.     Carmin Muskrat, MD 10/09/14 2040

## 2014-10-09 NOTE — ED Notes (Signed)
CBG 82 

## 2014-10-09 NOTE — ED Notes (Signed)
Pt states he had cp yesterday at about 1600, was sitting on side of bed, felt like people were sticking needles in his back.  Stayed about an hour and half, went away after some percocet's, states that percocet's are the only thing that help.

## 2014-10-09 NOTE — ED Notes (Signed)
Per EMS: has been walking outside a lot today, not getting a lot to drink, while walking had cp substernal without nausea, no c/o sob, hands were a little clammy but pt states that is normal, but also c/o numbness to hands bilateral but states that is normal as well, grip strengths equal bilateral.  Last meal was at 1100 this am, not had any water to drink today.  Urine has been but dark yellow.  324 asa given, 1 ntg given en route.  Pain started as a 4 and came down a 2 with ntg, 10/10 at worse described as pressure.  States this happened before and he blacked out that time, did not go to hospital at that time, denies drug use, no smoking.  Hx of seizures, takes depakote, 20 gauge left forearm.

## 2014-10-09 NOTE — ED Notes (Signed)
Gave patient 2 cups of water

## 2014-10-09 NOTE — Discharge Instructions (Signed)
As discussed, your evaluation today has been largely reassuring.  But, it is important that you monitor your condition carefully, and do not hesitate to return to the ED if you develop new, or concerning changes in your condition. ° °Otherwise, please follow-up with your physician for appropriate ongoing care. ° °Chest Pain (Nonspecific) °It is often hard to give a diagnosis for the cause of chest pain. There is always a chance that your pain could be related to something serious, such as a heart attack or a blood clot in the lungs. You need to follow up with your doctor. °HOME CARE °· If antibiotic medicine was given, take it as directed by your doctor. Finish the medicine even if you start to feel better. °· For the next few days, avoid activities that bring on chest pain. Continue physical activities as told by your doctor. °· Do not use any tobacco products. This includes cigarettes, chewing tobacco, and e-cigarettes. °· Avoid drinking alcohol. °· Only take medicine as told by your doctor. °· Follow your doctor's suggestions for more testing if your chest pain does not go away. °· Keep all doctor visits you made. °GET HELP IF: °· Your chest pain does not go away, even after treatment. °· You have a rash with blisters on your chest. °· You have a fever. °GET HELP RIGHT AWAY IF:  °· You have more pain or pain that spreads to your arm, neck, jaw, back, or belly (abdomen). °· You have shortness of breath. °· You cough more than usual or cough up blood. °· You have very bad back or belly pain. °· You feel sick to your stomach (nauseous) or throw up (vomit). °· You have very bad weakness. °· You pass out (faint). °· You have chills. °This is an emergency. Do not wait to see if the problems will go away. Call your local emergency services (911 in [place] Do not drive yourself to the hospital. °MAKE SURE YOU:  °· Understand these instructions. °· Will watch your condition. °· Will get help right away if you are not doing  well or get worse. °Document Released: 11/03/2007 Document Revised: 05/22/2013 Document Reviewed: 11/03/2007 °ExitCare® Patient Information ©2015 [employer]. This information is not intended to replace advice given to you by your health care provider. Make sure you discuss any questions you have with your health care provider. ° °

## 2014-10-12 ENCOUNTER — Encounter (HOSPITAL_COMMUNITY): Payer: Self-pay | Admitting: Emergency Medicine

## 2014-10-12 ENCOUNTER — Emergency Department (HOSPITAL_COMMUNITY)
Admission: EM | Admit: 2014-10-12 | Discharge: 2014-10-12 | Disposition: A | Discharge status: Home / Self Care | Payer: Medicaid Other | Source: Home / Self Care | Attending: Emergency Medicine | Admitting: Emergency Medicine

## 2014-10-12 ENCOUNTER — Emergency Department (HOSPITAL_COMMUNITY)
Admission: EM | Admit: 2014-10-12 | Discharge: 2014-10-12 | Disposition: A | Discharge status: Home / Self Care | Payer: Medicaid Other | Attending: Emergency Medicine | Admitting: Emergency Medicine

## 2014-10-12 ENCOUNTER — Encounter (HOSPITAL_COMMUNITY): Payer: Self-pay | Admitting: *Deleted

## 2014-10-12 DIAGNOSIS — Z791 Long term (current) use of non-steroidal anti-inflammatories (NSAID): Secondary | ICD-10-CM

## 2014-10-12 DIAGNOSIS — M546 Pain in thoracic spine: Secondary | ICD-10-CM | POA: Insufficient documentation

## 2014-10-12 DIAGNOSIS — Z79899 Other long term (current) drug therapy: Secondary | ICD-10-CM | POA: Insufficient documentation

## 2014-10-12 DIAGNOSIS — Z87891 Personal history of nicotine dependence: Secondary | ICD-10-CM | POA: Diagnosis not present

## 2014-10-12 DIAGNOSIS — F329 Major depressive disorder, single episode, unspecified: Secondary | ICD-10-CM

## 2014-10-12 DIAGNOSIS — G40909 Epilepsy, unspecified, not intractable, without status epilepticus: Secondary | ICD-10-CM

## 2014-10-12 DIAGNOSIS — Z880 Allergy status to penicillin: Secondary | ICD-10-CM | POA: Insufficient documentation

## 2014-10-12 DIAGNOSIS — Z8659 Personal history of other mental and behavioral disorders: Secondary | ICD-10-CM | POA: Insufficient documentation

## 2014-10-12 MED ORDER — DICLOFENAC POTASSIUM 50 MG PO TABS: 50.0000 mg | ORAL_TABLET | Freq: Three times a day (TID) | ORAL | Status: DC

## 2014-10-12 MED ORDER — CYCLOBENZAPRINE HCL 10 MG PO TABS: 10.0000 mg | ORAL_TABLET | Freq: Two times a day (BID) | ORAL | Status: DC | PRN

## 2014-10-12 NOTE — Discharge Instructions (Signed)
Have your medications filled as discussed Back Pain, Adult Low back pain is very common. About 1 in 5 people have back pain.The cause of low back pain is rarely dangerous. The pain often gets better over time.About half of people with a sudden onset of back pain feel better in just 2 weeks. About 8 in 10 people feel better by 6 weeks.  CAUSES Some common causes of back pain include:  Strain of the muscles or ligaments supporting the spine.  Wear and tear (degeneration) of the spinal discs.  Arthritis.  Direct injury to the back. DIAGNOSIS Most of the time, the direct cause of low back pain is not known.However, back pain can be treated effectively even when the exact cause of the pain is unknown.Answering your caregiver's questions about your overall health and symptoms is one of the most accurate ways to make sure the cause of your pain is not dangerous. If your caregiver needs more information, he or she may order lab work or imaging tests (X-rays or MRIs).However, even if imaging tests show changes in your back, this usually does not require surgery. HOME CARE INSTRUCTIONS For many people, back pain returns.Since low back pain is rarely dangerous, it is often a condition that people can learn to [facility] their own.   Remain active. It is stressful on the back to sit or stand in one place. Do not sit, drive, or stand in one place for more than 30 minutes at a time. Take short walks on level surfaces as soon as pain allows.Try to increase the length of time you walk each day.  Do not stay in bed.Resting more than 1 or 2 days can delay your recovery.  Do not avoid exercise or work.Your body is made to move.It is not dangerous to be active, even though your back may hurt.Your back will likely heal faster if you return to being active before your pain is gone.  Pay attention to your body when you bend and lift. Many people have less discomfortwhen lifting if they bend their knees,  keep the load close to their bodies,and avoid twisting. Often, the most comfortable positions are those that put less stress on your recovering back.  Find a comfortable position to sleep. Use a firm mattress and lie on your side with your knees slightly bent. If you lie on your back, put a pillow under your knees.  Only take over-the-counter or prescription medicines as directed by your caregiver. Over-the-counter medicines to reduce pain and inflammation are often the most helpful.Your caregiver may prescribe muscle relaxant drugs.These medicines help dull your pain so you can more quickly return to your normal activities and healthy exercise.  Put ice on the injured area.  Put ice in a plastic bag.  Place a towel between your skin and the bag.  Leave the ice on for 15-20 minutes, 03-04 times a day for the first 2 to 3 days. After that, ice and heat may be alternated to reduce pain and spasms.  Ask your caregiver about trying back exercises and gentle massage. This may be of some benefit.  Avoid feeling anxious or stressed.Stress increases muscle tension and can worsen back pain.It is important to recognize when you are anxious or stressed and learn ways to manage it.Exercise is a great option. SEEK MEDICAL CARE IF:  You have pain that is not relieved with rest or medicine.  You have pain that does not improve in 1 week.  You have new symptoms.  You  are generally not feeling well. SEEK IMMEDIATE MEDICAL CARE IF:   You have pain that radiates from your back into your legs.  You develop new bowel or bladder control problems.  You have unusual weakness or numbness in your arms or legs.  You develop nausea or vomiting.  You develop abdominal pain.  You feel faint. Document Released: 05/17/2005 Document Revised: 11/16/2011 Document Reviewed: 09/18/2013 [facility] Patient Information 2015 [place], [place]. This information is not intended to replace advice given to you by your  health care provider. Make sure you discuss any questions you have with your health care provider.

## 2014-10-12 NOTE — ED Provider Notes (Signed)
CSN: [id number]     Arrival date & time 10/12/14  5009 History  This chart was scribed for non-physician practitioner, Glendell Docker, NP, working with Davonna Belling, MD, by Stephania Fragmin, ED Scribe. This patient was seen in room WTR5/WTR5 and the patient's care was started at 7:01 PM.    Chief Complaint  Patient presents with  . Back Pain   The history is provided by the patient. No language interpreter was used.     HPI Comments: Franklin [name] is a 34 y.o. male who presents to the Emergency Department complaining of constant, moderate thoracic back pain with onset 1 day. He had been seen at [facility] earlier for this and prescribed diclofenac and flexeril. He states he can't afford the medication because he is covered by Medicaid, and the prescription costs $30.   Past Medical History  Diagnosis Date  . Depression   . Seizures    History reviewed. No pertinent past surgical history. History reviewed. No pertinent family history. History  Substance Use Topics  . Smoking status: Former Research scientist (life sciences)  . Smokeless tobacco: Not on file  . Alcohol Use: No    Review of Systems  Musculoskeletal: Positive for back pain.  All other systems reviewed and are negative.  Allergies  Penicillins  Home Medications   Prior to Admission medications   Medication Sig Start Date End Date Taking? Authorizing Provider  benzonatate (TESSALON) 100 MG capsule Take 1 capsule (100 mg total) by mouth every 8 (eight) hours. 09/19/14   Pamella Pert, MD  cyclobenzaprine (FLEXERIL) 10 MG tablet Take 1 tablet (10 mg total) by mouth 2 (two) times daily as needed for muscle spasms. 10/12/14   Noland Fordyce, PA-C  diclofenac (CATAFLAM) 50 MG tablet Take 1 tablet (50 mg total) by mouth 3 (three) times daily. 10/12/14   Noland Fordyce, PA-C  divalproex (DEPAKOTE ER) 250 MG 24 hr tablet Take 1 tablet (250 mg total) by mouth daily. 09/13/14   Evelina Bucy, MD  HYDROcodone-acetaminophen (NORCO/VICODIN) 5-325 MG per  tablet Take 1-2 tablets by mouth every 4 (four) hours as needed for moderate pain or severe pain. Patient not taking: Reported on 06/06/2014 04/24/14   Clayton Bibles, PA-C   BP 132/84 mmHg  Pulse 64  Temp(Src) 98.1 F (36.7 C) (Oral)  Resp 17  SpO2 100% Physical Exam  Constitutional: He is oriented to person, place, and time. He appears well-developed and well-nourished. No distress.  HENT:  Head: Normocephalic and atraumatic.  Eyes: Conjunctivae and EOM are normal.  Neck: Neck supple. No tracheal deviation present.  Cardiovascular: Normal rate.   Pulmonary/Chest: Effort normal. No respiratory distress.  Musculoskeletal: Normal range of motion.  Thoracic spine and paraspinal tenderness. Good sensation and strength to bilateral extremities  Neurological: He is alert and oriented to person, place, and time.  Skin: Skin is warm and dry.  Psychiatric: He has a normal mood and affect. His behavior is normal.  Nursing note and vitals reviewed.   ED Course  Procedures (including critical care time)  DIAGNOSTIC STUDIES: Oxygen Saturation is 100% on RA, normal by my interpretation.    COORDINATION OF CARE: 7:04 PM - Advised patient to fill flexeril at [facility], where it can be purchased for $4, and to take Advil in place of diclofenac. Pt verbalized understanding and agreed to plan.  MDM   Final diagnoses:  Bilateral thoracic back pain   Neurologically intact. Pt told that he can take ibuprofen as needed  I personally performed the services described  in this documentation, which was scribed in my presence. The recorded information has been reviewed and is accurate.    Glendell Docker, NP 10/12/14 1912  Davonna Belling, MD 10/13/14 [phone]

## 2014-10-12 NOTE — ED Provider Notes (Signed)
CSN: [id number]     Arrival date & time 10/12/14  1791 History  This chart was scribed for Orpah Greek, MD by Erling Conte, ED Scribe. This patient was seen in room TR06C/TR06C and the patient's care was started at 9:56 AM.     Chief Complaint  Patient presents with  . Back Pain   The history is provided by the patient. No language interpreter was used.    HPI Comments: Franklin [name] is a 34 y.o. male who presents to the Emergency Department complaining of constant, moderate, thoracic back pain for 1 day. Pt states he woke up with the pain yesterday morning. He denies any heavy lifting or recent falls. He states he took Tylenol and Ibuprofen with no relief. Pt notes he took 3 Percocet from his sister which provided him relief. He denies any h/o kidney stones. He denies any hematuria or dysuria. Denies fever, chills, n/v/d.  Past Medical History  Diagnosis Date  . Depression   . Seizures    History reviewed. No pertinent past surgical history. History reviewed. No pertinent family history. History  Substance Use Topics  . Smoking status: Former Research scientist (life sciences)  . Smokeless tobacco: Not on file  . Alcohol Use: No    Review of Systems  Constitutional: Negative for fever and chills.  Genitourinary: Negative for dysuria and hematuria.  Musculoskeletal: Positive for back pain.  All other systems reviewed and are negative.     Allergies  Penicillins  Home Medications   Prior to Admission medications   Medication Sig Start Date End Date Taking? Authorizing Provider  benzonatate (TESSALON) 100 MG capsule Take 1 capsule (100 mg total) by mouth every 8 (eight) hours. 09/19/14   Pamella Pert, MD  cyclobenzaprine (FLEXERIL) 10 MG tablet Take 1 tablet (10 mg total) by mouth 2 (two) times daily as needed for muscle spasms. 10/12/14   Noland Fordyce, PA-C  diclofenac (CATAFLAM) 50 MG tablet Take 1 tablet (50 mg total) by mouth 3 (three) times daily. 10/12/14   Noland Fordyce, PA-C   divalproex (DEPAKOTE ER) 250 MG 24 hr tablet Take 1 tablet (250 mg total) by mouth daily. 09/13/14   Evelina Bucy, MD  HYDROcodone-acetaminophen (NORCO/VICODIN) 5-325 MG per tablet Take 1-2 tablets by mouth every 4 (four) hours as needed for moderate pain or severe pain. Patient not taking: Reported on 06/06/2014 04/24/14   Clayton Bibles, PA-C   Triage Vitals: BP 108/61 mmHg  Pulse 65  Temp(Src) 98.2 F (36.8 C) (Oral)  Resp 18  SpO2 100%  Physical Exam  Constitutional: He appears well-developed and well-nourished.  HENT:  Head: Normocephalic and atraumatic.  Eyes: Conjunctivae are normal. No scleral icterus.  Neck: Normal range of motion.  Cardiovascular: Normal rate, regular rhythm and normal heart sounds.   Pulmonary/Chest: Effort normal and breath sounds normal. No respiratory distress. He has no wheezes. He has no rales. He exhibits no tenderness.  Abdominal: Soft. Bowel sounds are normal. He exhibits no distension and no mass. There is no tenderness. There is no rebound and no guarding.  Musculoskeletal: Normal range of motion. He exhibits tenderness.  Tenderness to thoracic spine and paraspinal muscles without step offs or crepitus. Full ROM of upper and lower extremities.  Neurological: He is alert.  Skin: Skin is warm and dry.  Skin intact, no ecchymosis, or erythema  Nursing note and vitals reviewed.   ED Course  Procedures (including critical care time)  DIAGNOSTIC STUDIES: Oxygen Saturation is 100% on RA, normal by my interpretation.  COORDINATION OF CARE: 9:38 AM- Will d/c pt with Flexeril and Cataflam. Advised him to f/u with PCP if problem persists. Pt advised of plan for treatment and pt agrees.     Labs Review Labs Reviewed - No data to display  Imaging Review No results found.   EKG Interpretation None      MDM   Final diagnoses:  Bilateral thoracic back pain    Pt is a 34yo male presenting to ED with sudden onset thoracic back pain. Pt reports  lifting a television the other day but states it was not very big. No known injuries. Hx of of kidney stones. No focal neuro deficit.  Do not believe imaging needed at this time. Not concerned for emergent process taking place. Will tx symptomatically as needed for pain. Rx: diclofenac and flexeril. Home care instructions provided. Return precautions provided. Pt verbalized understanding and agreement with tx plan.   I personally performed the services described in this documentation, which was scribed in my presence. The recorded information has been reviewed and is accurate.    Noland Fordyce, PA-C 10/12/14 1517  Orpah Greek, MD 10/12/14 [phone]

## 2014-10-12 NOTE — ED Notes (Signed)
Declined W/C at D/C and was escorted to lobby by RN. 

## 2014-10-12 NOTE — ED Notes (Signed)
Pt was seen at [facility] earlier for same complaint. Unable to fill medications because they are too expensive. No other c/c.

## 2014-10-12 NOTE — ED Notes (Signed)
Pt reports mid back pain since last night, denies injury. Denies urinary symptoms. Ambulatory on arrival.

## 2014-10-14 ENCOUNTER — Encounter: Payer: Self-pay | Admitting: Internal Medicine

## 2014-10-14 ENCOUNTER — Ambulatory Visit: Payer: Medicaid Other | Attending: Internal Medicine | Admitting: Internal Medicine

## 2014-10-14 VITALS — BP 131/80 | HR 60 | Temp 98.3°F | Ht 73.0 in | Wt 153.0 lb

## 2014-10-14 DIAGNOSIS — G40909 Epilepsy, unspecified, not intractable, without status epilepticus: Secondary | ICD-10-CM | POA: Insufficient documentation

## 2014-10-14 LAB — COMPLETE METABOLIC PANEL WITH GFR
ALT: 10 U/L (ref 0–53)
AST: 13 U/L (ref 0–37)
Albumin: 4.5 g/dL (ref 3.5–5.2)
Alkaline Phosphatase: 54 U/L (ref 39–117)
BILIRUBIN TOTAL: 1.8 mg/dL — AB (ref 0.2–1.2)
BUN: 11 mg/dL (ref 6–23)
CO2: 26 mEq/L (ref 19–32)
Calcium: 9.6 mg/dL (ref 8.4–10.5)
Chloride: 104 mEq/L (ref 96–112)
Creat: 0.84 mg/dL (ref 0.50–1.35)
GFR, Est Non African American: >89 mL/min
GLUCOSE: 75 mg/dL (ref 70–99)
Potassium: 3.9 mEq/L (ref 3.5–5.3)
Sodium: 139 mEq/L (ref 135–145)
Total Protein: 7.4 g/dL (ref 6.0–8.3)

## 2014-10-14 LAB — CBC
HCT: 43.9 % (ref 39.0–52.0)
Hemoglobin: 14.9 g/dL (ref 13.0–17.0)
MCH: 29.4 pg (ref 26.0–34.0)
MCHC: 33.9 g/dL (ref 30.0–36.0)
MCV: 86.8 fL (ref 78.0–100.0)
MPV: 11.1 fL (ref 8.6–12.4)
PLATELETS: 178 10*3/uL (ref 150–400)
RBC: 5.06 MIL/uL (ref 4.22–5.81)
RDW: 13.9 % (ref 11.5–15.5)
WBC: 5.6 10*3/uL (ref 4.0–10.5)

## 2014-10-14 LAB — LIPID PANEL
CHOLESTEROL: 168 mg/dL (ref 0–200)
HDL: 44 mg/dL (ref >=40)
LDL CALC: 113 mg/dL — AB (ref 0–99)
TRIGLYCERIDES: 53 mg/dL (ref <150)
Total CHOL/HDL Ratio: 3.8 Ratio
VLDL: 11 mg/dL (ref 0–40)
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
## 2014-10-14 NOTE — Progress Notes (Signed)
Patient here to establish care He has a history of seizures since he was  Patient has several seizures a week He has Rx for Depakote but only takes one pill per day He has midline back pain and was given Vicodin but could not afford it.  He has Flexeril and states it does not help He lives with his girlfriend and her sister and his girlfriend states he needs help with ADL's Patient does have medicaid Patient needs paperwork filled out to get a home health RN

## 2014-10-14 NOTE — Patient Instructions (Signed)
Neurology will call you soon

## 2014-10-15 LAB — HEMOGLOBIN A1C
Hgb A1c MFr Bld: 5.4 % (ref <5.7)
MEAN PLASMA GLUCOSE: 108 mg/dL (ref <117)

## 2014-10-17 ENCOUNTER — Telehealth: Payer: Self-pay | Admitting: *Deleted

## 2014-10-17 NOTE — Telephone Encounter (Signed)
Patient's girlfriend called to check on the status of the paperwork that was dropped of during his appointment for a home health RN, please f/u

## 2014-10-30 ENCOUNTER — Telehealth: Payer: Self-pay | Admitting: *Deleted

## 2014-10-30 NOTE — Telephone Encounter (Signed)
-----   Message from Lance Bosch, NP sent at 10/28/2014 12:52 PM EDT ----- Labs within normal limits, except back cholesterol slightly elevated. Please go over lifestyle and diet changes relating to cholesterol

## 2014-10-30 NOTE — Telephone Encounter (Signed)
Attempted to call patient.  Patient's voicemail does not give the option to leave a message.

## 2014-11-07 ENCOUNTER — Emergency Department (HOSPITAL_COMMUNITY)
Admission: EM | Admit: 2014-11-07 | Discharge: 2014-11-07 | Disposition: A | Discharge status: Home / Self Care | Payer: Medicaid Other | Attending: Emergency Medicine | Admitting: Emergency Medicine

## 2014-11-07 ENCOUNTER — Encounter (HOSPITAL_COMMUNITY): Payer: Self-pay | Admitting: *Deleted

## 2014-11-07 DIAGNOSIS — Y929 Unspecified place or not applicable: Secondary | ICD-10-CM | POA: Insufficient documentation

## 2014-11-07 DIAGNOSIS — X58XXXA Exposure to other specified factors, initial encounter: Secondary | ICD-10-CM | POA: Insufficient documentation

## 2014-11-07 DIAGNOSIS — M545 Low back pain, unspecified: Secondary | ICD-10-CM

## 2014-11-07 DIAGNOSIS — Z8659 Personal history of other mental and behavioral disorders: Secondary | ICD-10-CM | POA: Insufficient documentation

## 2014-11-07 DIAGNOSIS — Z87891 Personal history of nicotine dependence: Secondary | ICD-10-CM | POA: Insufficient documentation

## 2014-11-07 DIAGNOSIS — Y9389 Activity, other specified: Secondary | ICD-10-CM | POA: Diagnosis not present

## 2014-11-07 DIAGNOSIS — Z79899 Other long term (current) drug therapy: Secondary | ICD-10-CM | POA: Diagnosis not present

## 2014-11-07 DIAGNOSIS — Y998 Other external cause status: Secondary | ICD-10-CM | POA: Diagnosis not present

## 2014-11-07 DIAGNOSIS — Z880 Allergy status to penicillin: Secondary | ICD-10-CM | POA: Diagnosis not present

## 2014-11-07 DIAGNOSIS — G40909 Epilepsy, unspecified, not intractable, without status epilepticus: Secondary | ICD-10-CM | POA: Diagnosis not present

## 2014-11-07 DIAGNOSIS — S3992XA Unspecified injury of lower back, initial encounter: Secondary | ICD-10-CM | POA: Diagnosis not present

## 2014-11-07 MED ORDER — NAPROXEN 500 MG PO TABS: 500.0000 mg | ORAL_TABLET | Freq: Two times a day (BID) | ORAL | Status: DC

## 2014-11-07 NOTE — Discharge Instructions (Signed)

## 2014-11-07 NOTE — ED Notes (Signed)
Pt c/o lower back pain since last night.  States he has to lift girlfriend off of box springs on the floor and she is overweight.  States took a percocet last night and it completely relieved pain.

## 2014-11-07 NOTE — ED Provider Notes (Signed)
CSN: [id number]     Arrival date & time 11/07/14  1509 History  This chart was scribed for non-physician practitioner Franklin Miguel Rota, PA-C working with Blanchie Dessert, MD by Hilda Lias, ED Scribe. This patient was seen in room TR10C/TR10C and the patient's care was started at 3:44 PM.    Chief Complaint  Patient presents with  . Back Pain      The history is provided by the patient. No language interpreter was used.     HPI Comments: Franklin [name] is a 34 y.o. male who presents to the Emergency Department complaining of constant bilateral lower back pain that worsens with movement that began last night after pt strained it trying to lift his girlfriend from the mattress on the ground. Pt rates his pain as 10/10 and worse with movement such as twisting. He reports taking percocet earlier with complete relief of his pain.  Pt denies hx of cancer, denies loss of bladder control, loss of bowel control, hematuria, dysuria, frequency, urgency, fever, chills, numbness, tingling, or difficulty walking.  Past Medical History  Diagnosis Date  . Depression   . Seizures    History reviewed. No pertinent past surgical history. Family History  Problem Relation Age of Onset  . Heart disease Mother    History  Substance Use Topics  . Smoking status: Former Research scientist (life sciences)  . Smokeless tobacco: Not on file  . Alcohol Use: No    Review of Systems  Constitutional: Negative for fever and chills.  Gastrointestinal: Negative for nausea, vomiting and abdominal pain.  Genitourinary: Negative for dysuria, urgency, frequency, hematuria and difficulty urinating.  Musculoskeletal: Positive for back pain. Negative for joint swelling, gait problem, neck pain and neck stiffness.  Skin: Negative for rash.  Neurological: Negative for weakness and numbness.      Allergies  Penicillins  Home Medications   Prior to Admission medications   Medication Sig Start Date End Date Taking? Authorizing Provider   acetaminophen (TYLENOL) 500 MG tablet Take 500-1,000 mg by mouth every 6 (six) hours as needed for mild pain.   Yes Historical Provider, MD  divalproex (DEPAKOTE ER) 250 MG 24 hr tablet Take 1 tablet (250 mg total) by mouth daily. 09/13/14  Yes Evelina Bucy, MD  ibuprofen (ADVIL,MOTRIN) 200 MG tablet Take 600 mg by mouth every 6 (six) hours as needed for mild pain.   Yes Historical Provider, MD  naproxen (NAPROSYN) 500 MG tablet Take 1 tablet (500 mg total) by mouth 2 (two) times daily with a meal. 11/07/14   Waynetta Pean, PA-C   BP 123/74 mmHg  Pulse 70  Temp(Src) 99 F (37.2 C) (Oral)  Resp 16  Ht 6' (1.829 m)  Wt 153 lb (69.4 kg)  BMI 20.75 kg/m2  SpO2 98% Physical Exam  Constitutional: He is oriented to person, place, and time. He appears well-developed and well-nourished. No distress.  Nontoxic appearing.  HENT:  Head: Normocephalic and atraumatic.  Mouth/Throat: No oropharyngeal exudate.  Eyes: Conjunctivae are normal. Pupils are equal, round, and reactive to light. Right eye exhibits no discharge. Left eye exhibits no discharge.  Neck: Normal range of motion. Neck supple. No JVD present. No tracheal deviation present.  Cardiovascular: Normal rate, regular rhythm, normal heart sounds and intact distal pulses.   Pulmonary/Chest: Effort normal and breath sounds normal. No respiratory distress. He has no wheezes. He has no rales.  Abdominal: Soft. There is no tenderness.  Musculoskeletal: He exhibits no edema.  No midline back tenderness. No back edema, crepitus,  step offs or deformities. The patient is able to ambulate without difficulty or assistance. He has 5/5 strength in his bilateral lower extremities.   Lymphadenopathy:    He has no cervical adenopathy.  Neurological: He is alert and oriented to person, place, and time. He has normal reflexes. He displays normal reflexes. Coordination normal.  Bilateral patellar DTR's intact. Sensation is intact to his bilateral lower  extremities.   Skin: Skin is warm and dry. No rash noted. He is not diaphoretic. No erythema. No pallor.  Psychiatric: He has a normal mood and affect. His behavior is normal.  Nursing note and vitals reviewed.   ED Course  Procedures (including critical care time)  DIAGNOSTIC STUDIES: Oxygen Saturation is 98% on room air, normal by my interpretation.    COORDINATION OF CARE: 3:49 PM Discussed treatment plan with pt at bedside and pt agreed to plan.   Labs Review Labs Reviewed - No data to display  Imaging Review No results found.   EKG Interpretation None      Filed Vitals:   11/07/14 1534  BP: 123/74  Pulse: 70  Temp: 99 F (37.2 C)  TempSrc: Oral  Resp: 16  Height: 6' (1.829 m)  Weight: 153 lb (69.4 kg)  SpO2: 98%     MDM   Meds given in ED:  Medications - No data to display  Discharge Medication List as of 11/07/2014  3:56 PM    START taking these medications   Details  naproxen (NAPROSYN) 500 MG tablet Take 1 tablet (500 mg total) by mouth 2 (two) times daily with a meal., Starting 11/07/2014, Until Discontinued, Print        Final diagnoses:  Bilateral low back pain without sciatica   Patient with low back pain after straining it while lifting something last night.   No neurological deficits and normal neuro exam.  Patient can walk without difficulty or assistance.  No loss of bowel or bladder control.  No concern for cauda equina.  No fever, night sweats, weight loss, h/o cancer, IVDU.  RICE protocol and pain medicine indicated and discussed with patient. I advised the patient to follow-up with their primary care provider this week. I advised the patient to return to the emergency department with new or worsening symptoms or new concerns. The patient verbalized understanding and agreement with plan.    I personally performed the services described in this documentation, which was scribed in my presence. The recorded information has been reviewed and is  accurate.     Waynetta Pean, PA-C 11/07/14 1629  Blanchie Dessert, MD 11/08/14 2207

## 2014-11-08 ENCOUNTER — Telehealth: Payer: Self-pay

## 2014-11-08 NOTE — Telephone Encounter (Signed)
Called patient in reference to paperwork that had been dropped off for personal care services Per Chari Manning NP she would like patient seen by neurologist before we can fill out the paper work Spoke with patient and Franklin [name] has an appointment scheduled for December 12 2014

## 2014-11-13 ENCOUNTER — Encounter (HOSPITAL_COMMUNITY): Payer: Self-pay | Admitting: Emergency Medicine

## 2014-11-13 ENCOUNTER — Emergency Department (HOSPITAL_COMMUNITY)
Admission: EM | Admit: 2014-11-13 | Discharge: 2014-11-13 | Disposition: A | Discharge status: Home / Self Care | Payer: Medicaid Other | Attending: Emergency Medicine | Admitting: Emergency Medicine

## 2014-11-13 DIAGNOSIS — Z87891 Personal history of nicotine dependence: Secondary | ICD-10-CM | POA: Insufficient documentation

## 2014-11-13 DIAGNOSIS — R0602 Shortness of breath: Secondary | ICD-10-CM | POA: Diagnosis not present

## 2014-11-13 DIAGNOSIS — Z79899 Other long term (current) drug therapy: Secondary | ICD-10-CM | POA: Diagnosis not present

## 2014-11-13 DIAGNOSIS — Z880 Allergy status to penicillin: Secondary | ICD-10-CM | POA: Insufficient documentation

## 2014-11-13 DIAGNOSIS — R110 Nausea: Secondary | ICD-10-CM | POA: Insufficient documentation

## 2014-11-13 DIAGNOSIS — G40909 Epilepsy, unspecified, not intractable, without status epilepticus: Secondary | ICD-10-CM | POA: Diagnosis not present

## 2014-11-13 DIAGNOSIS — R42 Dizziness and giddiness: Secondary | ICD-10-CM | POA: Diagnosis present

## 2014-11-13 DIAGNOSIS — R002 Palpitations: Secondary | ICD-10-CM | POA: Diagnosis not present

## 2014-11-13 DIAGNOSIS — Z8659 Personal history of other mental and behavioral disorders: Secondary | ICD-10-CM | POA: Insufficient documentation

## 2014-11-13 LAB — I-STAT CHEM 8, ED
BUN: 13 mg/dL (ref 6–20)
CALCIUM ION: 1.22 mmol/L (ref 1.12–1.23)
Chloride: 106 mmol/L (ref 101–111)
Creatinine, Ser: 1 mg/dL (ref 0.61–1.24)
Glucose, Bld: 84 mg/dL (ref 65–99)
HEMATOCRIT: 45 % (ref 39.0–52.0)
HEMOGLOBIN: 15.3 g/dL (ref 13.0–17.0)
Potassium: 3.7 mmol/L (ref 3.5–5.1)
Sodium: 144 mmol/L (ref 135–145)
TCO2: 26 mmol/L (ref 0–100)

## 2014-11-13 LAB — I-STAT TROPONIN, ED: Troponin i, poc: 0 ng/mL (ref 0.00–0.08)

## 2014-11-13 MED ORDER — SODIUM CHLORIDE 0.9 % IV BOLUS (SEPSIS)
1000.0000 mL | Freq: Once | INTRAVENOUS | Status: AC
  Administered 2014-11-13: 1000 mL via INTRAVENOUS

## 2014-11-13 NOTE — ED Notes (Signed)
Per EMS: pt from home for eval of dizziness and "feeling my heart race." Pt was riding bike to the store when he started to feel dizzy, pt admits to not eating or drinking all day. Denies any pain at this time, EMS noted pt to be diaphoretic upon arrival. nad noted.

## 2014-11-13 NOTE — Discharge Instructions (Signed)
1. Medications: usual home medications 2. Treatment: rest, drink plenty of fluids, eat plenty of meals 3. Follow Up: Please followup with your primary doctor in 2 days for discussion of your diagnoses and further evaluation after today's visit; if you do not have a primary care doctor use the resource guide provided to find one; Please return to the ER for return of symptoms or other concerns    Dizziness Dizziness is a common problem. It is a feeling of unsteadiness or light-headedness. You may feel like you are about to faint. Dizziness can lead to injury if you stumble or fall. A person of any age group can suffer from dizziness, but dizziness is more common in older adults. CAUSES  Dizziness can be caused by many different things, including:  Middle ear problems.  Standing for too long.  Infections.  An allergic reaction.  Aging.  An emotional response to something, such as the sight of blood.  Side effects of medicines.  Tiredness.  Problems with circulation or blood pressure.  Excessive use of alcohol or medicines, or illegal drug use.  Breathing too fast (hyperventilation).  An irregular heart rhythm (arrhythmia).  A low red blood cell count (anemia).  Pregnancy.  Vomiting, diarrhea, fever, or other illnesses that cause body fluid loss (dehydration).  Diseases or conditions such as Parkinson's disease, high blood pressure (hypertension), diabetes, and thyroid problems.  Exposure to extreme heat. DIAGNOSIS  Your health care provider will ask about your symptoms, perform a physical exam, and perform an electrocardiogram (ECG) to record the electrical activity of your heart. Your health care provider may also perform other heart or blood tests to determine the cause of your dizziness. These may include:  Transthoracic echocardiogram (TTE). During echocardiography, sound waves are used to evaluate how blood flows through your heart.  Transesophageal echocardiogram  (TEE).  Cardiac monitoring. This allows your health care provider to monitor your heart rate and rhythm in real time.  Holter monitor. This is a portable device that records your heartbeat and can help diagnose heart arrhythmias. It allows your health care provider to track your heart activity for several days if needed.  Stress tests by exercise or by giving medicine that makes the heart beat faster. TREATMENT  Treatment of dizziness depends on the cause of your symptoms and can vary greatly. HOME CARE INSTRUCTIONS   Drink enough fluids to keep your urine clear or pale yellow. This is especially important in very hot weather. In older adults, it is also important in cold weather.  Take your medicine exactly as directed if your dizziness is caused by medicines. When taking blood pressure medicines, it is especially important to get up slowly.  Rise slowly from chairs and steady yourself until you feel okay.  In the morning, first sit up on the side of the bed. When you feel okay, stand slowly while holding onto something until you know your balance is fine.  Move your legs often if you need to stand in one place for a long time. Tighten and relax your muscles in your legs while standing.  Have someone stay with you for 1-2 days if dizziness continues to be a problem. Do this until you feel you are well enough to stay alone. Have the person call your health care provider if he or she notices changes in you that are concerning.  Do not drive or use heavy machinery if you feel dizzy.  Do not drink alcohol. SEEK IMMEDIATE MEDICAL CARE IF:  Your dizziness or light-headedness gets worse.  You feel nauseous or vomit.  You have problems talking, walking, or using your arms, hands, or legs.  You feel weak.  You are not thinking clearly or you have trouble forming sentences. It may take a friend or family member to notice this.  You have chest pain, abdominal pain, shortness of breath,  or sweating.  Your vision changes.  You notice any bleeding.  You have side effects from medicine that seems to be getting worse rather than better. MAKE SURE YOU:   Understand these instructions.  Will watch your condition.  Will get help right away if you are not doing well or get worse. Document Released: 11/10/2000 Document Revised: 05/22/2013 Document Reviewed: 12/04/2010 [facility] Patient Information 2015 [place], [place]. This information is not intended to replace advice given to you by your health care provider. Make sure you discuss any questions you have with your health care provider.

## 2014-11-13 NOTE — ED Provider Notes (Signed)
CSN: [id number]     Arrival date & time 11/13/14  1613 History   First MD Initiated Contact with Patient 11/13/14 1618     Chief Complaint  Patient presents with  . Dizziness     (Consider location/radiation/quality/duration/timing/severity/associated sxs/prior Treatment) The history is provided by the patient and medical records. No language interpreter was used.     Franklin [name] is a 34 y.o. male  with a hx of depression, seizures presents to the Emergency Department complaining of gradual, persistent lightheadedness and palpitations onset 3:30pm.  Pt reports he rode his bicycle several miles after grocery shopping and developed dizziness.  He reports he then called 911.  Per EMS, pt was diaphoretic upon their arrival.  Pt denies seizure activity today and has been taking his medication as directed. Associated symptoms include shortness of breath, nausea without vomiting.  Sitting on the sidewalk made the symptoms better but did not resolve them.   Pt denies fever, chills, headache, neck pain, back pain, chest pain, abdominal pain, vomiting, diarrhea, syncope, dysuria.   Pt also reports he has not eaten much food today.     Past Medical History  Diagnosis Date  . Depression   . Seizures    History reviewed. No pertinent past surgical history. Family History  Problem Relation Age of Onset  . Heart disease Mother    History  Substance Use Topics  . Smoking status: Former Research scientist (life sciences)  . Smokeless tobacco: Not on file  . Alcohol Use: No    Review of Systems  Constitutional: Negative for fever, diaphoresis, appetite change, fatigue and unexpected weight change.  HENT: Negative for mouth sores.   Eyes: Negative for visual disturbance.  Respiratory: Negative for cough, chest tightness, shortness of breath and wheezing.   Cardiovascular: Positive for palpitations. Negative for chest pain.  Gastrointestinal: Negative for nausea, vomiting, abdominal pain, diarrhea and constipation.   Endocrine: Negative for polydipsia, polyphagia and polyuria.  Genitourinary: Negative for dysuria, urgency, frequency and hematuria.  Musculoskeletal: Negative for back pain and neck stiffness.  Skin: Negative for rash.  Allergic/Immunologic: Negative for immunocompromised state.  Neurological: Positive for light-headedness. Negative for syncope and headaches.  Hematological: Does not bruise/bleed easily.  Psychiatric/Behavioral: Negative for sleep disturbance. The patient is not nervous/anxious.       Allergies  Penicillins  Home Medications   Prior to Admission medications   Medication Sig Start Date End Date Taking? Authorizing Provider  divalproex (DEPAKOTE ER) 250 MG 24 hr tablet Take 1 tablet (250 mg total) by mouth daily. Patient taking differently: Take 250 mg by mouth at bedtime.  09/13/14  Yes Evelina Bucy, MD  naproxen (NAPROSYN) 500 MG tablet Take 1 tablet (500 mg total) by mouth 2 (two) times daily with a meal. 11/07/14   Waynetta Pean, PA-C   BP 126/80 mmHg  Pulse 63  Temp(Src) 98 F (36.7 C) (Oral)  Resp 15  Ht 6' (1.829 m)  Wt 145 lb (65.772 kg)  BMI 19.66 kg/m2  SpO2 98% Physical Exam  Constitutional: He is oriented to person, place, and time. He appears well-developed and well-nourished. No distress.  Awake, alert, nontoxic appearance  HENT:  Head: Normocephalic and atraumatic.  Mouth/Throat: Oropharynx is clear and moist. No oropharyngeal exudate.  Eyes: Conjunctivae and EOM are normal. Pupils are equal, round, and reactive to light. No scleral icterus.  No horizontal, vertical or rotational nystagmus  Neck: Normal range of motion. Neck supple.  Full active and passive ROM without pain No midline or paraspinal  tenderness No nuchal rigidity or meningeal signs  Cardiovascular: Normal rate, regular rhythm, normal heart sounds and intact distal pulses.   No murmur heard. Pulmonary/Chest: Effort normal and breath sounds normal. No respiratory distress. He  has no wheezes. He has no rales.  Equal chest expansion  Abdominal: Soft. Bowel sounds are normal. He exhibits no mass. There is no tenderness. There is no rebound and no guarding.  Musculoskeletal: Normal range of motion. He exhibits no edema.  Lymphadenopathy:    He has no cervical adenopathy.  Neurological: He is alert and oriented to person, place, and time. He has normal reflexes. No cranial nerve deficit. He exhibits normal muscle tone. Coordination normal.  Mental Status:  Alert, oriented, thought content appropriate. Speech fluent without evidence of aphasia. Able to follow 2 step commands without difficulty.  Cranial Nerves:  II:  Peripheral visual fields grossly normal, pupils equal, round, reactive to light III,IV, VI: ptosis not present, extra-ocular motions intact bilaterally  V,VII: smile symmetric, facial light touch sensation equal VIII: hearing grossly normal bilaterally  IX,X: gag reflex present  XI: bilateral shoulder shrug equal and strong XII: midline tongue extension  Motor:  5/5 in upper and lower extremities bilaterally including strong and equal grip strength and dorsiflexion/plantar flexion Sensory: Pinprick and light touch normal in all extremities.  Deep Tendon Reflexes: 2+ and symmetric  Cerebellar: normal finger-to-nose with bilateral upper extremities Gait: normal gait and balance CV: distal pulses palpable throughout   Skin: Skin is warm and dry. No rash noted. He is not diaphoretic.  Psychiatric: He has a normal mood and affect. His behavior is normal. Judgment and thought content normal.  Nursing note and vitals reviewed.   ED Course  Procedures (including critical care time) Labs Review Labs Reviewed  CBG MONITORING, ED  I-STAT CHEM 8, ED  I-STAT TROPOININ, ED    Imaging Review No results found.   EKG Interpretation   Date/Time:  Wednesday November 13 2014 16:20:20 EDT Ventricular Rate:  56 PR Interval:  138 QRS Duration: 121 QT Interval:   395 QTC Calculation: 381 R Axis:   -3 Text Interpretation:  Sinus rhythm Nonspecific intraventricular conduction  delay ST elev, probable normal early repol pattern No significant change  since last tracing Confirmed by Knierim  MD, Saline ([id number]) on 11/14/2014  12:55:07 AM         MDM   Final diagnoses:  Lightheadedness   Franklin [name] presents with lightheadedness and palpitations after being out in the heat, riding his bike and not eating much today.  Will give fluids and reassess.  Patient labs reassuring. He is without hypoglycemia or elevated creatinine. Negative troponin and nonischemic EKG.    7:17 PM He reports tenderness has resolved after fluids and by mouth trial. He is eating and drinking without difficulty, nausea or vomiting. His vitals have remained stable throughout time in the emergency department. We'll discharge home with precautions for follow-up.  Doubt ACS, CVA or other emergent etiology.  BP 121/74 mmHg  Pulse 53  Temp(Src) 98 F (36.7 C) (Oral)  Resp 16  Ht 6' (1.829 m)  Wt 145 lb (65.772 kg)  BMI 19.66 kg/m2  SpO2 99% .    Jarrett Soho Shavonn Convey, PA-C 11/14/14 [id number]  Ernestina Patches, MD 11/14/14 [phone]

## 2014-11-13 NOTE — ED Notes (Signed)
Pt ambulated to bathroom in nad. Denies dizziness, pt given sprite to drink.

## 2014-11-14 ENCOUNTER — Encounter (HOSPITAL_COMMUNITY): Payer: Self-pay | Admitting: Emergency Medicine

## 2014-11-14 ENCOUNTER — Emergency Department (HOSPITAL_COMMUNITY)
Admission: EM | Admit: 2014-11-14 | Discharge: 2014-11-14 | Disposition: A | Discharge status: Home / Self Care | Payer: Medicaid Other | Attending: Emergency Medicine | Admitting: Emergency Medicine

## 2014-11-14 DIAGNOSIS — Z880 Allergy status to penicillin: Secondary | ICD-10-CM | POA: Diagnosis not present

## 2014-11-14 DIAGNOSIS — R42 Dizziness and giddiness: Secondary | ICD-10-CM | POA: Diagnosis not present

## 2014-11-14 DIAGNOSIS — Z87891 Personal history of nicotine dependence: Secondary | ICD-10-CM | POA: Insufficient documentation

## 2014-11-14 DIAGNOSIS — Z79899 Other long term (current) drug therapy: Secondary | ICD-10-CM | POA: Diagnosis not present

## 2014-11-14 DIAGNOSIS — Z8659 Personal history of other mental and behavioral disorders: Secondary | ICD-10-CM | POA: Insufficient documentation

## 2014-11-14 DIAGNOSIS — M549 Dorsalgia, unspecified: Secondary | ICD-10-CM | POA: Diagnosis not present

## 2014-11-14 DIAGNOSIS — R55 Syncope and collapse: Secondary | ICD-10-CM | POA: Diagnosis present

## 2014-11-14 DIAGNOSIS — G40909 Epilepsy, unspecified, not intractable, without status epilepticus: Secondary | ICD-10-CM | POA: Diagnosis not present

## 2014-11-14 NOTE — ED Provider Notes (Signed)
CSN: [id number]     Arrival date & time 11/14/14  1948 History   First MD Initiated Contact with Patient 11/14/14 2055     CC: syncope   (Consider location/radiation/quality/duration/timing/severity/associated sxs/prior Treatment) HPI Comments: Patient with history of seizure disorder, syncope, frequent ED visits -- presents after an episode of syncope tonight. Patient states that he got into a fight with his girlfriend after he took his Depakote today. She went in to get to the store to get back to ice. He states that he feels dizzy after he takes his Depakote and try to refuse but went anyway. Patient states that on the way to the store he syncopized. He did not have a prodrome and does not think he had a seizure. He states he probably hit his head when he fell. EMS was called. A friend gave him a Percocet. He only complains of back pain at this current time. No nausea, vomiting. No headache or neck pain. No weakness, numbness, or tingling in his arms or legs. Patient states that he is going to [facility] in the morning to be seen for recent syncopal episodes. He denies chest pain or shortness of breath. No urinary or bowel incontinence. No tongue biting.  The history is provided by the patient.    Past Medical History  Diagnosis Date  . Depression   . Seizures    History reviewed. No pertinent past surgical history. Family History  Problem Relation Age of Onset  . Heart disease Mother    History  Substance Use Topics  . Smoking status: Former Research scientist (life sciences)  . Smokeless tobacco: Not on file  . Alcohol Use: No    Review of Systems  Constitutional: Negative for fever.  HENT: Negative for congestion, rhinorrhea, sinus pressure and sore throat.   Eyes: Negative for photophobia, discharge, redness and visual disturbance.  Respiratory: Negative for cough and shortness of breath.   Cardiovascular: Negative for chest pain.  Gastrointestinal: Negative for nausea, vomiting,  abdominal pain, diarrhea and constipation.       Neg for fecal incontinence  Genitourinary: Negative for dysuria, hematuria, flank pain and difficulty urinating.       Negative for urinary incontinence or retention  Musculoskeletal: Positive for back pain. Negative for myalgias, gait problem, neck pain and neck stiffness.  Skin: Negative for rash.  Neurological: Positive for dizziness, syncope and light-headedness. Negative for speech difficulty, weakness, numbness and headaches.       Negative for saddle paresthesias   Psychiatric/Behavioral: Negative for confusion.      Allergies  Penicillins  Home Medications   Prior to Admission medications   Medication Sig Start Date End Date Taking? Authorizing Provider  divalproex (DEPAKOTE ER) 250 MG 24 hr tablet Take 1 tablet (250 mg total) by mouth daily. Patient taking differently: Take 250 mg by mouth at bedtime.  09/13/14   Evelina Bucy, MD  naproxen (NAPROSYN) 500 MG tablet Take 1 tablet (500 mg total) by mouth 2 (two) times daily with a meal. 11/07/14   Waynetta Pean, PA-C   BP 128/69 mmHg  Pulse 69  Temp(Src) 98.8 F (37.1 C) (Oral)  Resp 20  SpO2 98% Physical Exam  Constitutional: He is oriented to person, place, and time. He appears well-developed and well-nourished.  HENT:  Head: Normocephalic and atraumatic.  Right Ear: Tympanic membrane, external ear and ear canal normal.  Left Ear: Tympanic membrane, external ear and ear canal normal.  Nose: Nose normal.  Mouth/Throat: Uvula is midline,  oropharynx is clear and moist and mucous membranes are normal.  No apparent trauma of head.  Eyes: Conjunctivae, EOM and lids are normal. Pupils are equal, round, and reactive to light.  Neck: Normal range of motion. Neck supple.  Full range of motion of neck.  Cardiovascular: Normal rate and regular rhythm.   Pulmonary/Chest: Effort normal and breath sounds normal.  Abdominal: Soft. There is no tenderness.  Musculoskeletal: Normal  range of motion.       Cervical back: He exhibits normal range of motion, no tenderness and no bony tenderness.  Neurological: He is alert and oriented to person, place, and time. He has normal strength and normal reflexes. No cranial nerve deficit or sensory deficit. He exhibits normal muscle tone. He displays a negative Romberg sign. Coordination and gait normal. GCS eye subscore is 4. GCS verbal subscore is 5. GCS motor subscore is 6.  Skin: Skin is warm and dry.  Psychiatric: He has a normal mood and affect.  Nursing note and vitals reviewed.   ED Course  Procedures (including critical care time) Labs Review Labs Reviewed - No data to display  Imaging Review No results found.  ED ECG REPORT   Date: 11/14/2014  Rate: 56  Rhythm: sinus bradycardia  QRS Axis: normal  Intervals: normal  ST/T Wave abnormalities: nonspecific ST changes  Conduction Disutrbances:nonspecific intraventricular conduction delay  Narrative Interpretation:   Old EKG Reviewed: unchanged  I have personally reviewed the EKG tracing and agree with the computerized printout as noted.  9:25 PM Patient seen and examined. Will check orthostatics and likely d/c to home. Patient appears very well. He requests note to tell girlfriend that he shouldn't walk to the store after he takes his depakote.   Vital signs reviewed and are as follows: BP 128/69 mmHg  Pulse 69  Temp(Src) 98.8 F (37.1 C) (Oral)  Resp 20  SpO2 98%  10:18 PM Negative orthostatics. Will d/c. Encouraged to rest after taking Depakote if it makes him lightheaded or dizzy. Patient encouraged to hydrate well. He states he has going to see [facility] tomorrow morning.  Patient urged to return with worsening symptoms or other concerns. Patient verbalized understanding and agrees with plan.      MDM   Final diagnoses:  Syncope, unspecified syncope type   Patient with uncomplicated syncopal episode. Do not suspect  significant head or neck injury from his fall. Patient does not have any symptoms consistent with seizure including prodrome, postictal state. Patient is at his baseline. He appears well, will discharge to home. EKG is unchanged and orthostatics are negative.   Carlisle Cater, PA-C 11/14/14 [place], MD 11/15/14 [phone]

## 2014-11-14 NOTE — Discharge Instructions (Signed)
Please read and follow all provided instructions.  Your diagnoses today include:  1. Syncope, unspecified syncope type    Tests performed today include:  Vital signs. See below for your results today.   Medications prescribed:   None  Take any prescribed medications only as directed.  Home care instructions:  Follow any educational materials contained in this packet.  Please be careful when walking after you take your Depakote as this can make you dizzy and cause you to fall.   Follow-up instructions: Please follow-up with your primary care provider in the next 3 days for further evaluation of your symptoms.   Return instructions:   Please return to the Emergency Department if you experience worsening symptoms.   Please return if you have any other emergent concerns.  Additional Information:  Your vital signs today were: BP 123/74 mmHg   Pulse 60   Temp(Src) 98.8 F (37.1 C) (Oral)   Resp 16   SpO2 98% If your blood pressure (BP) was elevated above 135/85 this visit, please have this repeated by your doctor within one month. --------------

## 2014-11-14 NOTE — ED Notes (Signed)
Pt. arrived with EMS reports dizziness with brief syncopal episode while walking to the store , alert and oriented at arrival/ respirations unlabored . Denies pain or injury.

## 2014-11-15 ENCOUNTER — Ambulatory Visit: Payer: Medicaid Other | Attending: Internal Medicine | Admitting: Family Medicine

## 2014-11-15 VITALS — BP 135/91 | HR 69 | Temp 98.0°F | Resp 18 | Ht 72.0 in | Wt 155.8 lb

## 2014-11-15 DIAGNOSIS — R569 Unspecified convulsions: Secondary | ICD-10-CM

## 2014-11-15 NOTE — Progress Notes (Signed)
Subjective:     Patient ID: Franklin [name], male   DOB: 02-Aug-1980, 34 y.o.   MRN: [id number]  HPI   Patient with a history of seizure and syncopal episodes and bipolar disorder presents after being seen two tmes in the last few days for syncopy and presyncopy and dizziness. He has no symptoms today. The episode yesterday was after an argument with his girlfriend. The syncopal episode was not related to seizure activity. Blood work in the ED was within normal limits.  He is a patient of Mrs. Feliciana Rossetti and has An appointment with a neurologist on 7/14. He describes two types of dizziness. One is room spinning and the other lightheadedness.   Review of Systems  Negative for chest pain, Positive for shortness of breath  Related to the syncopal episode. Negative for fever, chills,  Positive for dizziness     Objective:   Physical Exam   Alert, oriented, appropriate, in no acute distress. PERL, EOMS intact, grips strong and equal, normal strength of upper and lower extremeties Skin warm and dry Neck supple FROM w/o adenopathy or tenderness Lungs clear to auscultation HS regular w/o m,g,r Orthostatic VS normal    Assessment plan     Seizures and syncopal episodes   - He has a referral per Mrs. Feliciana Rossetti with neurologist    - Have advise to stay on the ground, no climbing ladder, etc.  -  Have advised to not go out unaccompanied until sees neurologist on 7/14. - Follow-up  With PCP as needed.       Micheline Chapman, FNP [facility]

## 2014-11-15 NOTE — Progress Notes (Signed)
Patient here due to feeling lightheaded. Patient reports that he went to the hospital last night and the day before due to blacking out and feeling light headed. Patient denies any pain today. Patient request referral for home care because the ED told him that he should request for home care due to his seizure disorder.

## 2014-11-15 NOTE — Patient Instructions (Signed)
Stay in out of the heat. No strenous activities Avoid stressful situations. Take medications as prescribed No climbing (stay on the ground) until you have seen the neurologist on 7/14. Follow-up here or in ED for problems.

## 2014-11-16 ENCOUNTER — Emergency Department (HOSPITAL_COMMUNITY)
Admission: EM | Admit: 2014-11-16 | Discharge: 2014-11-16 | Disposition: A | Discharge status: Home / Self Care | Payer: Medicaid Other | Attending: Emergency Medicine | Admitting: Emergency Medicine

## 2014-11-16 ENCOUNTER — Encounter (HOSPITAL_COMMUNITY): Payer: Self-pay | Admitting: Emergency Medicine

## 2014-11-16 DIAGNOSIS — F329 Major depressive disorder, single episode, unspecified: Secondary | ICD-10-CM | POA: Diagnosis not present

## 2014-11-16 DIAGNOSIS — Z79899 Other long term (current) drug therapy: Secondary | ICD-10-CM | POA: Diagnosis not present

## 2014-11-16 DIAGNOSIS — Z880 Allergy status to penicillin: Secondary | ICD-10-CM | POA: Insufficient documentation

## 2014-11-16 DIAGNOSIS — Z87891 Personal history of nicotine dependence: Secondary | ICD-10-CM | POA: Insufficient documentation

## 2014-11-16 DIAGNOSIS — G40909 Epilepsy, unspecified, not intractable, without status epilepticus: Secondary | ICD-10-CM | POA: Insufficient documentation

## 2014-11-16 LAB — CBC WITH DIFFERENTIAL/PLATELET
BASOS PCT: 1 % (ref 0–1)
Basophils Absolute: 0 10*3/uL (ref 0.0–0.1)
Eosinophils Absolute: 0 10*3/uL (ref 0.0–0.7)
Eosinophils Relative: 1 % (ref 0–5)
HEMATOCRIT: 43.4 % (ref 39.0–52.0)
Hemoglobin: 14.8 g/dL (ref 13.0–17.0)
LYMPHS ABS: 1.1 10*3/uL (ref 0.7–4.0)
Lymphocytes Relative: 26 % (ref 12–46)
MCH: 30.1 pg (ref 26.0–34.0)
MCHC: 34.1 g/dL (ref 30.0–36.0)
MCV: 88.2 fL (ref 78.0–100.0)
MONO ABS: 0.3 10*3/uL (ref 0.1–1.0)
Monocytes Relative: 7 % (ref 3–12)
NEUTROS ABS: 2.7 10*3/uL (ref 1.7–7.7)
Neutrophils Relative %: 65 % (ref 43–77)
Platelets: 166 10*3/uL (ref 150–400)
RBC: 4.92 MIL/uL (ref 4.22–5.81)
RDW: 12.7 % (ref 11.5–15.5)
WBC: 4.1 10*3/uL (ref 4.0–10.5)

## 2014-11-16 LAB — RAPID URINE DRUG SCREEN, HOSP PERFORMED
Amphetamines: NOT DETECTED
Barbiturates: NOT DETECTED
Benzodiazepines: NOT DETECTED
COCAINE: NOT DETECTED
Opiates: NOT DETECTED
Tetrahydrocannabinol: NOT DETECTED

## 2014-11-16 LAB — BASIC METABOLIC PANEL
Anion gap: 3 — ABNORMAL LOW (ref 5–15)
BUN: 13 mg/dL (ref 6–20)
CO2: 26 mmol/L (ref 22–32)
Calcium: 9.1 mg/dL (ref 8.9–10.3)
Chloride: 108 mmol/L (ref 101–111)
Creatinine, Ser: 0.86 mg/dL (ref 0.61–1.24)
GFR calc Af Amer: >60 mL/min (ref >60)
GFR calc non Af Amer: >60 mL/min (ref >60)
Glucose, Bld: 125 mg/dL — ABNORMAL HIGH (ref 65–99)
Potassium: 4.3 mmol/L (ref 3.5–5.1)
Sodium: 137 mmol/L (ref 135–145)

## 2014-11-16 LAB — URINALYSIS, ROUTINE W REFLEX MICROSCOPIC
Bilirubin Urine: NEGATIVE
GLUCOSE, UA: NEGATIVE mg/dL
Hgb urine dipstick: NEGATIVE
KETONES UR: NEGATIVE mg/dL
Leukocytes, UA: NEGATIVE
Nitrite: NEGATIVE
Protein, ur: NEGATIVE mg/dL
Specific Gravity, Urine: 1.011 (ref 1.005–1.030)
Urobilinogen, UA: 0.2 mg/dL (ref 0.0–1.0)
pH: 7.5 (ref 5.0–8.0)

## 2014-11-16 LAB — ETHANOL: Alcohol, Ethyl (B): <5 mg/dL (ref <5)

## 2014-11-16 LAB — VALPROIC ACID LEVEL: VALPROIC ACID LVL: 17 ug/mL — AB (ref 50.0–100.0)

## 2014-11-16 LAB — CBG MONITORING, ED: GLUCOSE-CAPILLARY: 77 mg/dL (ref 65–99)

## 2014-11-16 MED ORDER — DIVALPROEX SODIUM 500 MG PO DR TAB
500.0000 mg | DELAYED_RELEASE_TABLET | Freq: Once | ORAL | Status: AC
  Administered 2014-11-16: 500 mg via ORAL
  Filled 2014-11-16: qty 1

## 2014-11-16 MED ORDER — DIVALPROEX SODIUM ER 250 MG PO TB24
250.0000 mg | ORAL_TABLET | Freq: Every day | ORAL | Status: DC
  Administered 2014-11-16: 250 mg via ORAL
  Filled 2014-11-16: qty 1

## 2014-11-16 NOTE — ED Notes (Addendum)
Family at bedside reports pt onset of "not responding" onset 1126. Upon assessment pt VS WNL; pt staring in space, eyes opening spontaneously, and non verbal.

## 2014-11-16 NOTE — ED Notes (Addendum)
CBG 77. Pt intake 8 ounces of orange juice at present time.

## 2014-11-16 NOTE — ED Provider Notes (Signed)
CSN: [id number]     Arrival date & time 11/16/14  3154 History   First MD Initiated Contact with Patient 11/16/14 1005     Chief Complaint  Patient presents with  . Seizures     (Consider location/radiation/quality/duration/timing/severity/associated sxs/prior Treatment) The history is provided by the patient and medical records.    This is a 34 y.o. M with PMH significant for depression and seizures, presenting to the ED for seizure.  Patient states he had a seizure last night and was seen in the ED afterwards. He states this morning he had 2 additional seizures that were witnessed by his girlfriend. His girlfriend is not present in the ED to help with history. He is unsure how long his seizures lasted, however was reported to EMS they lasted approximately 30 minutes. He states he thinks he injured his hip during the seizure as he has some right hip pain. No numbness, weakness, or paresthesias of right leg. He is able to ambulate independently without difficulty. Patient is on home Depakote. He states his primary care physician recently decreased his dose from 500 mg to 250 mg. He states he has been taking his medications as prescribed, no missed doses. Patient denies any recent illness, fever, sweats, or chills. No alcohol or drug use. Patient is currently awake, alert, and oriented. He states he is hungry and would like to eat.  Past Medical History  Diagnosis Date  . Depression   . Seizures    History reviewed. No pertinent past surgical history. Family History  Problem Relation Age of Onset  . Heart disease Mother    History  Substance Use Topics  . Smoking status: Former Research scientist (life sciences)  . Smokeless tobacco: Not on file  . Alcohol Use: No    Review of Systems  Neurological: Positive for seizures.  All other systems reviewed and are negative.     Allergies  Penicillins  Home Medications   Prior to Admission medications   Medication Sig Start Date End Date Taking? Authorizing  Provider  divalproex (DEPAKOTE ER) 250 MG 24 hr tablet Take 1 tablet (250 mg total) by mouth daily. Patient taking differently: Take 250 mg by mouth at bedtime.  09/13/14  Yes Evelina Bucy, MD  naproxen (NAPROSYN) 500 MG tablet Take 1 tablet (500 mg total) by mouth 2 (two) times daily with a meal. Patient not taking: Reported on 11/15/2014 11/07/14   Waynetta Pean, PA-C   BP 123/71 mmHg  Pulse 51  Temp(Src) 98 F (36.7 C) (Oral)  Resp 18  SpO2 99%   Physical Exam  Constitutional: He is oriented to person, place, and time. He appears well-developed and well-nourished. No distress.  texting on cell phone, requesting to eat  HENT:  Head: Normocephalic and atraumatic.  Mouth/Throat: Oropharynx is clear and moist.  Eyes: Conjunctivae and EOM are normal. Pupils are equal, round, and reactive to light.  Neck: Normal range of motion. Neck supple.  Cardiovascular: Normal rate, regular rhythm and normal heart sounds.   Pulmonary/Chest: Effort normal and breath sounds normal. No respiratory distress. He has no wheezes.  Abdominal: Soft. Bowel sounds are normal. There is no tenderness. There is no guarding.  Musculoskeletal: Normal range of motion. He exhibits no edema.       Right hip: Normal. He exhibits normal range of motion, normal strength, no tenderness and no bony tenderness.  Neurological: He is alert and oriented to person, place, and time.  AAOx3, answering questions appropriately; equal strength UE and LE bilaterally; CN  grossly intact; moves all extremities appropriately without ataxia; no focal neuro deficits or facial asymmetry appreciated  Skin: Skin is warm and dry. He is not diaphoretic.  Psychiatric: He has a normal mood and affect.  Nursing note and vitals reviewed.   ED Course  Procedures (including critical care time) Labs Review Labs Reviewed  BASIC METABOLIC PANEL - Abnormal; Notable for the following:    Glucose, Bld 125 (*)    Anion gap 3 (*)    All other components  within normal limits  VALPROIC ACID LEVEL - Abnormal; Notable for the following:    Valproic Acid Lvl 17 (*)    All other components within normal limits  CBC WITH DIFFERENTIAL/PLATELET  URINALYSIS, ROUTINE W REFLEX MICROSCOPIC (NOT AT [facility])  URINE RAPID DRUG SCREEN, HOSP PERFORMED  ETHANOL  CBG MONITORING, ED    Imaging Review No results found.   EKG Interpretation None      MDM   Final diagnoses:  Seizure disorder   34 y.o. M with hx of seizures here for seizure x2 this morning.  He states seizure yesterday and was seen in ED but upon reviewing notes, he was here for alleged syncope.  He was also seen the day before for palpitations and dizziness.  Patient is AAOx3, not post-ictal.  He is freely texting on his cell phone and is requesting food to eat.  He states he has been compliant with his depakote, however told pharmacy he has not taken today.  Will give home dose of depakote now.  Plan to check labs, EKG.  Anticipate discharge.  Lab work as above-- depakote level low at 17.  Patient orally loaded with additional 500mg  of depakote per pharmacy recommendations.  Patient had brief episode where he went "unresponsive" and i was called to room.  He was awake, alert, tracking movement and able to answer questions shaking head "yes" and "no".  Do not feel this was active seizure.  Patient stable for discharge.  He states he has upcoming follow-up with neurology next month-- encouraged to keep this appt.  FU with PCP during interim.  Discussed plan with patient, he/she acknowledged understanding and agreed with plan of care.  Return precautions given for new or worsening symptoms.  Larene Pickett, PA-C 11/16/14 [place], MD 11/19/14 1414

## 2014-11-16 NOTE — ED Notes (Addendum)
Per EMS pt complaint of continued seizures onset yesterday resulting in right ribcage pain. Significant other witness seizures lasting 30 minutes at a time laying in bed.

## 2014-11-16 NOTE — Discharge Instructions (Signed)
Continue your home depakote. Make sure to follow-up with [place] [facility]. Follow-up with your primary care physician. Return here for new concerns.

## 2014-11-16 NOTE — ED Notes (Signed)
Bed: VW86 Expected date:  Expected time:  Means of arrival:  Comments: seizure

## 2014-11-16 NOTE — ED Notes (Signed)
Ham sandwich and ice water given pre request.

## 2014-11-19 ENCOUNTER — Ambulatory Visit: Payer: Self-pay

## 2014-11-19 ENCOUNTER — Encounter (HOSPITAL_COMMUNITY): Payer: Self-pay | Admitting: Nurse Practitioner

## 2014-11-19 ENCOUNTER — Emergency Department (HOSPITAL_COMMUNITY): Payer: Medicaid Other

## 2014-11-19 ENCOUNTER — Emergency Department (HOSPITAL_COMMUNITY)
Admission: EM | Admit: 2014-11-19 | Discharge: 2014-11-19 | Disposition: A | Discharge status: Home / Self Care | Payer: Medicaid Other | Attending: Emergency Medicine | Admitting: Emergency Medicine

## 2014-11-19 DIAGNOSIS — S0121XA Laceration without foreign body of nose, initial encounter: Secondary | ICD-10-CM | POA: Diagnosis present

## 2014-11-19 DIAGNOSIS — G40909 Epilepsy, unspecified, not intractable, without status epilepticus: Secondary | ICD-10-CM | POA: Diagnosis not present

## 2014-11-19 DIAGNOSIS — Z880 Allergy status to penicillin: Secondary | ICD-10-CM | POA: Insufficient documentation

## 2014-11-19 DIAGNOSIS — R55 Syncope and collapse: Secondary | ICD-10-CM | POA: Insufficient documentation

## 2014-11-19 DIAGNOSIS — S0083XA Contusion of other part of head, initial encounter: Secondary | ICD-10-CM | POA: Diagnosis not present

## 2014-11-19 DIAGNOSIS — S8992XA Unspecified injury of left lower leg, initial encounter: Secondary | ICD-10-CM | POA: Diagnosis not present

## 2014-11-19 DIAGNOSIS — Y9301 Activity, walking, marching and hiking: Secondary | ICD-10-CM | POA: Insufficient documentation

## 2014-11-19 DIAGNOSIS — Y92524 Gas station as the place of occurrence of the external cause: Secondary | ICD-10-CM | POA: Insufficient documentation

## 2014-11-19 DIAGNOSIS — S0181XA Laceration without foreign body of other part of head, initial encounter: Secondary | ICD-10-CM

## 2014-11-19 DIAGNOSIS — Y998 Other external cause status: Secondary | ICD-10-CM | POA: Insufficient documentation

## 2014-11-19 DIAGNOSIS — S0093XA Contusion of unspecified part of head, initial encounter: Secondary | ICD-10-CM

## 2014-11-19 DIAGNOSIS — F329 Major depressive disorder, single episode, unspecified: Secondary | ICD-10-CM | POA: Diagnosis not present

## 2014-11-19 DIAGNOSIS — S022XXA Fracture of nasal bones, initial encounter for closed fracture: Secondary | ICD-10-CM | POA: Diagnosis not present

## 2014-11-19 DIAGNOSIS — W25XXXA Contact with sharp glass, initial encounter: Secondary | ICD-10-CM | POA: Diagnosis not present

## 2014-11-19 DIAGNOSIS — Z87891 Personal history of nicotine dependence: Secondary | ICD-10-CM | POA: Diagnosis not present

## 2014-11-19 DIAGNOSIS — R040 Epistaxis: Secondary | ICD-10-CM | POA: Insufficient documentation

## 2014-11-19 MED ORDER — NAPROXEN 500 MG PO TABS: 500.0000 mg | ORAL_TABLET | Freq: Two times a day (BID) | ORAL | Status: DC

## 2014-11-19 MED ORDER — AMOXICILLIN 500 MG PO CAPS: 500.0000 mg | ORAL_CAPSULE | Freq: Three times a day (TID) | ORAL | Status: DC

## 2014-11-19 MED ORDER — OXYMETAZOLINE HCL 0.05 % NA SOLN
1.0000 | Freq: Once | NASAL | Status: AC
  Administered 2014-11-19: 1 via NASAL
  Filled 2014-11-19: qty 15

## 2014-11-19 MED ORDER — OXYCODONE-ACETAMINOPHEN 7.5-325 MG PO TABS: ORAL_TABLET | ORAL | Status: DC

## 2014-11-19 NOTE — Discharge Instructions (Signed)
Call the ENT office in the morning for follow up. Return here as needed.  Do not take the narcotic if driving as it will make you sleepy.

## 2014-11-19 NOTE — ED Notes (Signed)
He walked into a sliding glass door and hit his nose. He has laceration to bridge of nose and his nose was bleeding but has stopped now. He c/o pain in his nose now. He denies any other complaints. No bleeding now. A&Ox4.

## 2014-11-19 NOTE — ED Provider Notes (Signed)
CSN: [id number]     Arrival date & time 11/19/14  1713 History  This chart was scribed for non-physician practitioner Debroah Baller, NP working with Orlie Dakin, MD by Meriel Pica, ED Scribe. This patient was seen in room TR08C/TR08C and the patient's care was started at 5:49 PM.    Chief Complaint  Patient presents with  . Facial Injury   Patient is a 34 y.o. male presenting with facial injury. The history is provided by the patient. No language interpreter was used.  Facial Injury Mechanism of injury:  Laceration Location:  Nose Time since incident:  30 minutes Pain details:    Severity:  Moderate   Duration:  30 minutes   Timing:  Constant   Progression:  Unchanged Chronicity:  New Foreign body present:  No foreign bodies Relieved by:  Nothing Worsened by:  Nothing tried Ineffective treatments:  None tried Associated symptoms: epistaxis and loss of consciousness   Associated symptoms: no altered mental status and no headaches     HPI Comments: Franklin [name] is a 34 y.o. male, with a PMhx of depression and seizures, who presents to the Emergency Department complaining of sudden onset, constant, moderate pain and swelling to bridge of nose with associated laceration to bridge of nose and epistaxis of left nares s/p accident 30 minuets ago. He notes associated left knee pain and a headache that has resolved. Pt reports he ran into a sliding glass door at a gas station, hitting his nose, falling backwards and then losing consciousness for 3 minutes. Pt has been seen multiple times in the ED recently for complaints of seizures and seizure like symptoms. On his last ED visit on 6/18 he was stable for discharge home after receiving 500 mg Depakote orally and advised to keep his follow up appt with neurology next month. He is on Depakote at home that was recently decreased from 500mg  to 250mg   by his PCP Chari Manning, MD. Tetanus UTD. He denies any other myalgias or arthralgias.  Past  Medical History  Diagnosis Date  . Depression   . Seizures    History reviewed. No pertinent past surgical history. Family History  Problem Relation Age of Onset  . Heart disease Mother    History  Substance Use Topics  . Smoking status: Former Research scientist (life sciences)  . Smokeless tobacco: Not on file  . Alcohol Use: No    Review of Systems  HENT: Positive for facial swelling and nosebleeds.   Musculoskeletal: Positive for joint swelling and arthralgias.  Skin: Positive for wound.  Neurological: Positive for loss of consciousness and syncope. Negative for headaches.  All other systems reviewed and are negative.  Allergies  Penicillins  Home Medications   Prior to Admission medications   Medication Sig Start Date End Date Taking? Authorizing Provider  amoxicillin (AMOXIL) 500 MG capsule Take 1 capsule (500 mg total) by mouth 3 (three) times daily. 11/19/14   Hope Bunnie Pion, NP  divalproex (DEPAKOTE ER) 250 MG 24 hr tablet Take 1 tablet (250 mg total) by mouth daily. Patient taking differently: Take 250 mg by mouth at bedtime.  09/13/14   Evelina Bucy, MD  naproxen (NAPROSYN) 500 MG tablet Take 1 tablet (500 mg total) by mouth 2 (two) times daily. 11/19/14   Hope Bunnie Pion, NP  oxyCODONE-acetaminophen (PERCOCET) 7.5-325 MG per tablet Take one tablet PO every 4 to 6 hours as needed for pain 11/19/14   [facility], NP   BP 128/97 mmHg  Pulse 87  Temp(Src) 98 F (36.7 C) (Oral)  Resp 18  Ht 6' (1.829 m)  Wt 155 lb (70.308 kg)  BMI 21.02 kg/m2  SpO2 98% Physical Exam  Constitutional: He is oriented to person, place, and time. He appears well-developed and well-nourished. No distress.  HENT:  Head: Normocephalic. Head is with contusion.  Right Ear: Tympanic membrane and external ear normal.  Left Ear: Tympanic membrane and external ear normal.  Nose: Mucosal edema, nose lacerations, sinus tenderness and nasal deformity present. Epistaxis is observed.    Mouth/Throat: Oropharynx is clear and  moist. No oropharyngeal exudate.  Deformity of nasal bridge, swelling, active bleeding from the left nares. Superficial laceration to bridge of nose.  Eyes: Conjunctivae and EOM are normal. Pupils are equal, round, and reactive to light.  No entrapment   Neck: Normal range of motion. Neck supple.  Cardiovascular: Normal rate.   Pulmonary/Chest: Effort normal.  Abdominal: Soft. There is no tenderness.  Musculoskeletal: Normal range of motion.       Left knee: He exhibits normal range of motion, no swelling, no ecchymosis, no deformity, no laceration and no erythema. Tenderness found.  Neurological: He is alert and oriented to person, place, and time. No cranial nerve deficit.  Skin: Skin is warm and dry.  Psychiatric: He has a normal mood and affect. His behavior is normal.  Nursing note and vitals reviewed.   ED Course  Procedures  DIAGNOSTIC STUDIES: Oxygen Saturation is 98% on RA, normal by my interpretation.    COORDINATION OF CARE: 5:52 PM Discussed treatment plan which includes to order a head CT and Afrin 0.05% nasal spray with pt. Pt acknowledges and agrees to plan.  8:15 PM Consulted with attending provider, Dr. Winfred Leeds who reviewed the CT scan and will start pt on amoxicillin and pain management. Patient will follow up with ENT. Will also prescribe Afrin 0.05% nasal spray BID for 3 days.   Labs Review Labs Reviewed - No data to display  Imaging Review Ct Head Wo Contrast  11/19/2014   CLINICAL DATA:  Facial trauma. Patient ran into glass door. Loss of consciousness for 3 minutes. Swelling of the bridge of the nose. Laceration to the bridge of the nose. Epistaxis.  EXAM: CT HEAD WITHOUT CONTRAST  CT MAXILLOFACIAL WITHOUT CONTRAST  TECHNIQUE: Multidetector CT imaging of the head and maxillofacial structures were performed using the standard protocol without intravenous contrast. Multiplanar CT image reconstructions of the maxillofacial structures were also generated.   COMPARISON:  Head CT 06/25/2014.  FINDINGS: CT HEAD FINDINGS  No mass lesion, mass effect, midline shift, hydrocephalus, hemorrhage. No territorial ischemia or acute infarction. No skull fracture.  CT MAXILLOFACIAL FINDINGS  Globes: Intact  Bony orbits:  Intact  Pterygoid plates: Intact  Mandibular condyles:  Located  Mandible: Motion artifact is present through the mandible. This produces artifactual step-off deformity.  Teeth:  Carious and absent dentition.  Intracranial contents:  Normal.  Paranasal sinuses: Mucous retention cyst/ polyp in the floor the RIGHT maxillary sinus. Mild mucosal thickening in the RIGHT maxillary sinus. Ethmoid air cells appear clear. LEFT nasal septal spur are noted. Frontal sinuses clear.  Mastoid air cells: Normal. Debris in both external auditory canals noted.  Nasal bones: Mild bilateral nasal bone fractures, with rightward displacement. Mild depression of the RIGHT nasal bone. Diastasis and buckling of the RIGHT nasal maxillary suture. Soft tissue swelling is present over the bridge of the nose.  Soft tissues: Normal aside from nasal bridge contusion/hematoma.  Visible cervical spine: Intact  IMPRESSION: 1. Mildly displaced bilateral nasal bone fractures. Associated fracture at the naso maxillary sutures. 2. Carious dentition. 3. Negative CT head.   Electronically Signed   By: Dereck Ligas M.D.   On: 11/19/2014 19:53   Ct Maxillofacial Wo Cm  11/19/2014   CLINICAL DATA:  Facial trauma. Patient ran into glass door. Loss of consciousness for 3 minutes. Swelling of the bridge of the nose. Laceration to the bridge of the nose. Epistaxis.  EXAM: CT HEAD WITHOUT CONTRAST  CT MAXILLOFACIAL WITHOUT CONTRAST  TECHNIQUE: Multidetector CT imaging of the head and maxillofacial structures were performed using the standard protocol without intravenous contrast. Multiplanar CT image reconstructions of the maxillofacial structures were also generated.  COMPARISON:  Head CT 06/25/2014.   FINDINGS: CT HEAD FINDINGS  No mass lesion, mass effect, midline shift, hydrocephalus, hemorrhage. No territorial ischemia or acute infarction. No skull fracture.  CT MAXILLOFACIAL FINDINGS  Globes: Intact  Bony orbits:  Intact  Pterygoid plates: Intact  Mandibular condyles:  Located  Mandible: Motion artifact is present through the mandible. This produces artifactual step-off deformity.  Teeth:  Carious and absent dentition.  Intracranial contents:  Normal.  Paranasal sinuses: Mucous retention cyst/ polyp in the floor the RIGHT maxillary sinus. Mild mucosal thickening in the RIGHT maxillary sinus. Ethmoid air cells appear clear. LEFT nasal septal spur are noted. Frontal sinuses clear.  Mastoid air cells: Normal. Debris in both external auditory canals noted.  Nasal bones: Mild bilateral nasal bone fractures, with rightward displacement. Mild depression of the RIGHT nasal bone. Diastasis and buckling of the RIGHT nasal maxillary suture. Soft tissue swelling is present over the bridge of the nose.  Soft tissues: Normal aside from nasal bridge contusion/hematoma.  Visible cervical spine: Intact  IMPRESSION: 1. Mildly displaced bilateral nasal bone fractures. Associated fracture at the naso maxillary sutures. 2. Carious dentition. 3. Negative CT head.   Electronically Signed   By: Dereck Ligas M.D.   On: 11/19/2014 19:53    MDM  34 y.o. male with nasal swelling, deformity and bleeding s/p accidentally walking into a glass door. Stable for d/c with bleeding stopped. Alert and oriented, normal head CT. Will treat with pain medication, antibiotics and follow up with ENT. Discussed with the patient and all questioned fully answered. He voices understanding and agree with plan.    Final diagnoses:  Nasal fracture, closed, initial encounter  Contusion of head, initial encounter   I personally performed the services described in this documentation, which was scribed in my presence. The recorded information has  been reviewed and is accurate.    [address] [place], [place] 11/19/14 2034  Orlie Dakin, MD 11/19/14 2356

## 2014-11-21 ENCOUNTER — Ambulatory Visit: Payer: Self-pay | Admitting: Neurology

## 2014-11-21 ENCOUNTER — Ambulatory Visit: Payer: Self-pay | Admitting: Internal Medicine

## 2014-11-21 ENCOUNTER — Ambulatory Visit (INDEPENDENT_AMBULATORY_CARE_PROVIDER_SITE_OTHER): Payer: Medicaid Other | Admitting: Otolaryngology

## 2014-11-21 DIAGNOSIS — S022XXA Fracture of nasal bones, initial encounter for closed fracture: Secondary | ICD-10-CM | POA: Diagnosis not present

## 2014-11-22 ENCOUNTER — Ambulatory Visit: Payer: Self-pay | Admitting: Internal Medicine

## 2014-11-27 ENCOUNTER — Ambulatory Visit (INDEPENDENT_AMBULATORY_CARE_PROVIDER_SITE_OTHER): Payer: Medicaid Other | Admitting: Neurology

## 2014-11-27 ENCOUNTER — Encounter: Payer: Self-pay | Admitting: Neurology

## 2014-11-27 VITALS — BP 120/60 | HR 52 | Ht 72.0 in | Wt 154.5 lb

## 2014-11-27 DIAGNOSIS — G40309 Generalized idiopathic epilepsy and epileptic syndromes, not intractable, without status epilepticus: Secondary | ICD-10-CM | POA: Diagnosis not present

## 2014-11-27 MED ORDER — LAMOTRIGINE ER 25 MG PO TB24: ORAL_TABLET | ORAL | Status: DC

## 2014-11-27 MED ORDER — DIVALPROEX SODIUM ER 250 MG PO TB24: 250.0000 mg | ORAL_TABLET | Freq: Every day | ORAL | Status: DC

## 2014-11-27 NOTE — Patient Instructions (Signed)
1. Schedule 1-hour sleep deprived EEG 2. Schedule MRI brain with and without contrast seizure protocol 3. Start Lamotrigine ER 25mg : Take 1 tablet daily for 2 weeks, then increase to 2 tablets daily for 2 weeks, then increase to 4 tablets daily and continue 4. Continue Depakote ER 250mg  1 tablet once a day 5. Follow-up in 6 weeks, call for any problems  Seizure Precautions: 1. If medication has been prescribed for you to prevent seizures, take it exactly as directed.  Do not stop taking the medicine without talking to your doctor first, even if you have not had a seizure in a long time.   2. Avoid activities in which a seizure would cause danger to yourself or to others.  Don't operate dangerous machinery, swim alone, or climb in high or dangerous places, such as on ladders, roofs, or girders.  Do not drive unless your doctor says you may.  3. If you have any warning that you may have a seizure, lay down in a safe place where you can't hurt yourself.    4.  No driving for 6 months from last seizure, as per [facility].   Please refer to the following link on the [place] website for more information: [email or web address]/[email or web address]/[email or web address]   5.  Maintain good sleep hygiene.  6.  Contact your doctor if you have any problems that may be related to the medicine you are taking.  7.  Call 911 and bring the patient back to the ED if:        A.  The seizure lasts longer than 5 minutes.       B.  The patient doesn't awaken shortly after the seizure  C.  The patient has new problems such as difficulty seeing, speaking or moving  D.  The patient was injured during the seizure  E.  The patient has a temperature over 102 F (39C)  F.  The patient vomited and now is having trouble breathing

## 2014-11-27 NOTE — Progress Notes (Signed)
NEUROLOGY CONSULTATION NOTE  Franklin [name] MRN: [id number] DOB: 10-Dec-1980  Referring provider: Chari Manning, NP Primary care provider: Chari Manning, NP  Reason for consult:  seizures  Dear Dr Feliciana Rossetti:  Thank you for your kind referral of Franklin [name] for consultation of the above symptoms. Although his history is well known to you, please allow me to reiterate it for the purpose of our medical record. The patient was accompanied to the clinic by his cousin who also provides collateral information. Records and images were personally reviewed where available.  HISTORY OF PRESENT ILLNESS: This is a 34 year old right-handed man with a history of bipolar disorder, mild cognitive impairment, and seizures since age 43 or 10. He and his cousin both report that seizures would start with staring and unresponsiveness, followed by low amplitude shaking of both hands. Sometimes he would have violent leg kicking. They state that shaking last from 30 minutes up to "1-1/2 hours" followed by confusion for around 20 minutes. He would be sleepy after a seizure. He has had urinary incontinence with some of them, no tongue bite or other significant injuries. He reports all seizures occur during wakefulness, no nocturnal seizures that he is aware of, but woke up one time with urinary incontinence. He denies any olfactory/gustatory hallucinations, deja vu, rising epigastric sensation, focal numbness/tingling/weakness, myoclonic jerks. He occasionally drops things from his hands. He reports that Depakote was started at age 59, however he has only been taking 250mg /day, stating that when he was on 500 or 750mg , he would be "like a zombie, walking into doors." The low dose Depakote has not controlled the seizures, he reports seizures occurring every 3-4 days, longest seizure-free interval probably 1-2 months. His last seizure was a week ago.   On review of records on [facility], he has been to the ER several times since 2008  with no note of a seizure history until 2014. There are some records of patient being on Depakote, Tegretol, and Haldol, presumably for Bipolar disorder. He reports chronic headaches occurring 2-3 times a week with sharp pain in the back of his head, "like a needle sticking in it" lasting 30-45 minutes. He would feel lightheaded. No associated nausea, vomiting, photo/phonophobia, or visual obscurations. He takes Tylenol or aspirin with good effect. He denies any diplopia, dysarthria, dysphagia, focal numbness/tingling/weakness. He has occasional neck and back pain, occasional constipation. He lives with his girlfriend. He finished 12th grade in special education classes.   Epilepsy Risk Factors:  His maternal uncle used to have seizures. He was born premature, with mild cognitive impairment. Otherwise he denies any history of febrile convulsions, CNS infections such as meningitis/encephalitis, significant traumatic brain injury, neurosurgical procedures.  Laboratory Data:  Lab Results  Component Value Date   WBC 4.1 11/16/2014   HGB 14.8 11/16/2014   HCT 43.4 11/16/2014   MCV 88.2 11/16/2014   PLT 166 11/16/2014     Chemistry      Component Value Date/Time   NA 137 11/16/2014 1030   K 4.3 11/16/2014 1030   CL 108 11/16/2014 1030   CO2 26 11/16/2014 1030   BUN 13 11/16/2014 1030   CREATININE 0.86 11/16/2014 1030   CREATININE 0.84 10/14/2014 1445      Component Value Date/Time   CALCIUM 9.1 11/16/2014 1030   ALKPHOS 54 10/14/2014 1445   AST 13 10/14/2014 1445   ALT 10 10/14/2014 1445   BILITOT 1.8* 10/14/2014 1445     Lab Results  Component Value Date  VALPROATE 17* 11/16/2014   I personally reviewed head CT without contrast done 10/2014 which did not show any acute changes.  PAST MEDICAL HISTORY: Past Medical History  Diagnosis Date  . Depression   . Seizures     PAST SURGICAL HISTORY: History reviewed. No pertinent past surgical history.  MEDICATIONS: Current  Outpatient Prescriptions on File Prior to Visit  Medication Sig Dispense Refill  . amoxicillin (AMOXIL) 500 MG capsule Take 1 capsule (500 mg total) by mouth 3 (three) times daily. 21 capsule 0  . divalproex (DEPAKOTE ER) 250 MG 24 hr tablet Take 1 tablet (250 mg total) by mouth daily. (Patient taking differently: Take 250 mg by mouth at bedtime. ) 30 tablet 0  . naproxen (NAPROSYN) 500 MG tablet Take 1 tablet (500 mg total) by mouth 2 (two) times daily. 30 tablet 0  . oxyCODONE-acetaminophen (PERCOCET) 7.5-325 MG per tablet Take one tablet PO every 4 to 6 hours as needed for pain 20 tablet 0   No current facility-administered medications on file prior to visit.    ALLERGIES: Allergies  Allergen Reactions  . Penicillins Nausea And Vomiting    FAMILY HISTORY: Family History  Problem Relation Age of Onset  . Heart disease Mother     SOCIAL HISTORY: History   Social History  . Marital Status: Single    Spouse Name: N/A  . Number of Children: N/A  . Years of Education: N/A   Occupational History  . Not on file.   Social History Main Topics  . Smoking status: Former Research scientist (life sciences)  . Smokeless tobacco: Not on file  . Alcohol Use: No  . Drug Use: No  . Sexual Activity: Not Currently   Other Topics Concern  . Not on file   Social History Narrative    REVIEW OF SYSTEMS: Constitutional: No fevers, chills, or sweats, no generalized fatigue, change in appetite Eyes: No visual changes, double vision, eye pain Ear, nose and throat: No hearing loss, ear pain, nasal congestion, sore throat Cardiovascular: No chest pain, palpitations Respiratory:  No shortness of breath at rest or with exertion, wheezes GastrointestinaI: No nausea, vomiting, diarrhea, abdominal pain, fecal incontinence Genitourinary:  No dysuria, urinary retention or frequency Musculoskeletal:  + neck pain, back pain Integumentary: No rash, pruritus, skin lesions Neurological: as above Psychiatric: No depression,  insomnia, anxiety Endocrine: No palpitations, fatigue, diaphoresis, mood swings, change in appetite, change in weight, increased thirst Hematologic/Lymphatic:  No anemia, purpura, petechiae. Allergic/Immunologic: no itchy/runny eyes, nasal congestion, recent allergic reactions, rashes  PHYSICAL EXAM: Filed Vitals:   11/27/14 0824  BP: 120/60  Pulse: 52   General: No acute distress Head:  Normocephalic/atraumatic Eyes: Fundoscopic exam shows bilateral sharp discs, no vessel changes, exudates, or hemorrhages Neck: supple, no paraspinal tenderness, full range of motion Back: No paraspinal tenderness Heart: regular rate and rhythm Lungs: Clear to auscultation bilaterally. Vascular: No carotid bruits. Skin/Extremities: No rash, no edema Neurological Exam: Mental status: alert and oriented to person, place, and time, no dysarthria or aphasia, Fund of knowledge is appropriate.  Recent and remote memory are intact. 3/3 delayed recall. Missed 1 letter spelling WORLD backwards. Attention and concentration are normal. Able to name objects and repeat phrases. Cranial nerves: CN I: not tested CN II: pupils equal, round and reactive to light, visual fields intact, fundi unremarkable. CN III, IV, VI:  full range of motion, no nystagmus, no ptosis CN V: facial sensation intact CN VII: upper and lower face symmetric CN VIII: hearing intact to finger  rub CN IX, X: gag intact, uvula midline CN XI: sternocleidomastoid and trapezius muscles intact CN XII: tongue midline Bulk & Tone: normal, no fasciculations. Motor: 5/5 throughout with no pronator drift. Sensation: intact to light touch, cold, pin, vibration and joint position sense.  No extinction to double simultaneous stimulation.  Romberg test negative Deep Tendon Reflexes: +2 throughout, no ankle clonus Plantar responses: downgoing bilaterally Cerebellar: no incoordination on finger to nose, heel to shin. No dysdiadochokinesia Gait:  narrow-based and steady, able to tandem walk adequately. Tremor: none  IMPRESSION: This is a 34 year old right-handed man with a history of bipolar disorder, mild cognitive impairment, presenting with a history of seizures since age 30 or 73. Review of records from ER visits in the past have no indication of seizures or seizure medication until 2014. He states he has never seen a neurologist in the past. Seizures consist of staring followed by shaking, he has had urinary incontinence with some of them, however the prolonged duration of some (lasting 1-1/2 hours) also raises the possibility of non-epileptic events. 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 A 1-hour sleep deprived EEG and MRI brain with and without contrast will be ordered to classify his seizures and assess for focal abnormalities that increase risk for recurrent seizures. He has had side effects on higher dose of Depakote. He will start Lamotrigine ER 25mg  daily with uptitration every 2 weeks. This may help with mood stabilization as well. Side effects, including risks for [employer] syndrome, were discussed. He will continue on low dose Depakote ER 250mg  daily for now. [place] driving laws were discussed with the patient, and he knows to stop driving after a seizure, until 6 months seizure-free. He does not drive. He will follow-up in 6 weeks and knows to call our office for any problems.   Thank you for allowing me to participate in the care of this patient. Please do not hesitate to call for any questions or concerns.   Ellouise Newer, M.D.  CC: Chari Manning, NP

## 2014-11-28 ENCOUNTER — Ambulatory Visit: Payer: Medicaid Other | Attending: Internal Medicine | Admitting: Internal Medicine

## 2014-11-28 ENCOUNTER — Encounter: Payer: Self-pay | Admitting: Internal Medicine

## 2014-11-28 VITALS — BP 128/83 | HR 77 | Temp 98.6°F | Resp 16 | Wt 153.6 lb

## 2014-11-28 DIAGNOSIS — S022XXD Fracture of nasal bones, subsequent encounter for fracture with routine healing: Secondary | ICD-10-CM

## 2014-11-28 MED ORDER — TRAMADOL HCL 50 MG PO TABS
50.0000 mg | ORAL_TABLET | Freq: Two times a day (BID) | ORAL | Status: DC | PRN
Start: 2014-11-28 — End: 2015-01-08

## 2014-11-28 NOTE — Progress Notes (Signed)
Patient ID: Franklin [name], male   DOB: 1981/01/04, 34 y.o.   MRN: [id number]  CC: facial pain   HPI: Franklin [name] is a 34 y.o. male here today for a follow up visit.  Patient has past medical history of depression and seizure disorder. On 6/21 he was seen in ER for a nasal bone fracture. He was then referred to ENT. He reports going to ENT last week for follow up and was given nasal spray and discharged home. He states that he continues to have nasal pain and would like a refill of his pain medication.  Patient has No headache, No chest pain, No abdominal pain - No Nausea, No new weakness tingling or numbness, No Cough - SOB.  Allergies  Allergen Reactions  . Penicillins Nausea And Vomiting   Past Medical History  Diagnosis Date  . Depression   . Seizures    Current Outpatient Prescriptions on File Prior to Visit  Medication Sig Dispense Refill  . divalproex (DEPAKOTE ER) 250 MG 24 hr tablet Take 1 tablet (250 mg total) by mouth at bedtime. 30 tablet 6  . LamoTRIgine (LAMICTAL XR) 25 MG TB24 tablet Take 1 tablet daily for 2 weeks, then increase to 2 tablets daily for 2 weeks, then increase to 4 tablets daily and continue 120 tablet 1  . naproxen (NAPROSYN) 500 MG tablet Take 1 tablet (500 mg total) by mouth 2 (two) times daily. 30 tablet 0  . oxyCODONE-acetaminophen (PERCOCET) 7.5-325 MG per tablet Take one tablet PO every 4 to 6 hours as needed for pain 20 tablet 0  . amoxicillin (AMOXIL) 500 MG capsule Take 1 capsule (500 mg total) by mouth 3 (three) times daily. (Patient not taking: Reported on 11/28/2014) 21 capsule 0   No current facility-administered medications on file prior to visit.   Family History  Problem Relation Age of Onset  . Heart disease Mother    History   Social History  . Marital Status: Single    Spouse Name: N/A  . Number of Children: N/A  . Years of Education: N/A   Occupational History  . Not on file.   Social History Main Topics  . Smoking status:  Former Research scientist (life sciences)  . Smokeless tobacco: Not on file  . Alcohol Use: No  . Drug Use: No  . Sexual Activity: Not Currently   Other Topics Concern  . Not on file   Social History Narrative    Review of Systems: See HPI   Objective:   Filed Vitals:   11/28/14 1521  BP: 128/83  Pulse: 77  Temp: 98.6 F (37 C)  Resp: 16    Physical Exam: Visibly noticeable displaced nasal bone. Tender to touch around facial area.   Lab Results  Component Value Date   WBC 4.1 11/16/2014   HGB 14.8 11/16/2014   HCT 43.4 11/16/2014   MCV 88.2 11/16/2014   PLT 166 11/16/2014   Lab Results  Component Value Date   CREATININE 0.86 11/16/2014   BUN 13 11/16/2014   NA 137 11/16/2014   K 4.3 11/16/2014   CL 108 11/16/2014   CO2 26 11/16/2014    Lab Results  Component Value Date   HGBA1C 5.4 10/14/2014   Lipid Panel     Component Value Date/Time   CHOL 168 10/14/2014 1445   TRIG 53 10/14/2014 1445   HDL 44 10/14/2014 1445   CHOLHDL 3.8 10/14/2014 1445   VLDL 11 10/14/2014 1445   LDLCALC 113* 10/14/2014 1445  Assessment and plan:   Franklin [name] was seen today for refill on pain med.  Diagnoses and all orders for this visit:  Nasal bones, closed fracture, with routine healing, subsequent encounter Orders: -    Refill traMADol (ULTRAM) 50 MG tablet; Take 1 tablet (50 mg total) by mouth every 12 (twelve) hours as needed. Continue follow up with ENT     Return if symptoms worsen or fail to improve.   Chari Manning, [facility] [phone] 11/28/2014, 4:11 PM

## 2014-11-28 NOTE — Progress Notes (Signed)
Pt present today for refill on oxycodone.

## 2014-11-28 NOTE — Progress Notes (Deleted)
   Subjective:    Patient ID: Franklin [name], male    DOB: 1981/05/10, 34 y.o.   MRN: [id number]  Back Pain      Review of Systems  Musculoskeletal: Positive for back pain.       Objective:   Physical Exam        Assessment & Plan:

## 2014-11-29 ENCOUNTER — Ambulatory Visit: Payer: Self-pay | Admitting: Neurology

## 2014-12-06 ENCOUNTER — Ambulatory Visit (HOSPITAL_COMMUNITY): Admission: RE | Admit: 2014-12-06 | Payer: Medicaid Other | Source: Ambulatory Visit

## 2014-12-11 ENCOUNTER — Telehealth: Payer: Self-pay | Admitting: Internal Medicine

## 2014-12-11 NOTE — Telephone Encounter (Signed)
Patient called to check on the status of an order to have a nurse go to his house. Please f/u with pt.

## 2014-12-12 ENCOUNTER — Ambulatory Visit: Payer: Self-pay | Admitting: Neurology

## 2014-12-13 NOTE — Telephone Encounter (Signed)
Patient called to check on the status for an order to have a home health nurse. Please f/u with pt.

## 2014-12-17 ENCOUNTER — Encounter: Payer: Self-pay | Admitting: Neurology

## 2014-12-18 ENCOUNTER — Ambulatory Visit (INDEPENDENT_AMBULATORY_CARE_PROVIDER_SITE_OTHER): Payer: Medicaid Other | Admitting: Neurology

## 2014-12-18 DIAGNOSIS — G40309 Generalized idiopathic epilepsy and epileptic syndromes, not intractable, without status epilepticus: Secondary | ICD-10-CM

## 2014-12-18 NOTE — Telephone Encounter (Signed)
Patient called to check on the status of an order to have a nurse go to his house. Please f/u with pt.

## 2014-12-19 ENCOUNTER — Ambulatory Visit (HOSPITAL_COMMUNITY): Admission: RE | Admit: 2014-12-19 | Payer: Medicaid Other | Source: Ambulatory Visit

## 2014-12-21 ENCOUNTER — Emergency Department (HOSPITAL_COMMUNITY)
Admission: EM | Admit: 2014-12-21 | Discharge: 2014-12-21 | Disposition: A | Discharge status: Home / Self Care | Payer: Medicaid Other | Attending: Emergency Medicine | Admitting: Emergency Medicine

## 2014-12-21 ENCOUNTER — Encounter (HOSPITAL_COMMUNITY): Payer: Self-pay | Admitting: Nurse Practitioner

## 2014-12-21 ENCOUNTER — Emergency Department (HOSPITAL_COMMUNITY): Payer: Medicaid Other

## 2014-12-21 DIAGNOSIS — Z79899 Other long term (current) drug therapy: Secondary | ICD-10-CM | POA: Insufficient documentation

## 2014-12-21 DIAGNOSIS — G40909 Epilepsy, unspecified, not intractable, without status epilepticus: Secondary | ICD-10-CM | POA: Insufficient documentation

## 2014-12-21 DIAGNOSIS — K5900 Constipation, unspecified: Secondary | ICD-10-CM | POA: Diagnosis present

## 2014-12-21 DIAGNOSIS — K5909 Other constipation: Secondary | ICD-10-CM | POA: Diagnosis not present

## 2014-12-21 DIAGNOSIS — F329 Major depressive disorder, single episode, unspecified: Secondary | ICD-10-CM | POA: Insufficient documentation

## 2014-12-21 DIAGNOSIS — Z791 Long term (current) use of non-steroidal anti-inflammatories (NSAID): Secondary | ICD-10-CM | POA: Diagnosis not present

## 2014-12-21 DIAGNOSIS — R001 Bradycardia, unspecified: Secondary | ICD-10-CM | POA: Insufficient documentation

## 2014-12-21 DIAGNOSIS — Z880 Allergy status to penicillin: Secondary | ICD-10-CM | POA: Insufficient documentation

## 2014-12-21 DIAGNOSIS — M546 Pain in thoracic spine: Secondary | ICD-10-CM | POA: Diagnosis not present

## 2014-12-21 MED ORDER — NAPROXEN 500 MG PO TABS: 500.0000 mg | ORAL_TABLET | Freq: Two times a day (BID) | ORAL | Status: DC

## 2014-12-21 MED ORDER — METHOCARBAMOL 500 MG PO TABS: 500.0000 mg | ORAL_TABLET | Freq: Two times a day (BID) | ORAL | Status: DC

## 2014-12-21 MED ORDER — POLYETHYLENE GLYCOL 3350 17 G PO PACK: 17.0000 g | PACK | Freq: Every day | ORAL | Status: DC

## 2014-12-21 NOTE — ED Provider Notes (Signed)
CSN: [id number]     Arrival date & time 12/21/14  1437 History   First MD Initiated Contact with Patient 12/21/14 1810     Chief Complaint  Patient presents with  . Back Pain  . Constipation     (Consider location/radiation/quality/duration/timing/severity/associated sxs/prior Treatment) HPI   34 year old male with recurrent back pain presents for evaluation of back pain and abd pain. Patient reports yesterday he was helping his girlfriend who weighs more than 600 pounds off a couch. He also has the assistance of a home health nurse. He felt pain to his mid back afterward which he was given 2 Percocets from the home health nurse the course him. This is not his medication. Patient continued to endorse sharp throbbing mid back pain that is nonradiating. Movement can worsen his pain. Furthermore patient also complaining of having constipation for the past 2 days. He had a bowel movement yesterday but it was less than normal. He is able to pass flatus. He denies having fever, chills, lightheadedness, dizziness, headache, productive cough, hemoptysis, shortness of breath, dysuria, hematuria, hematochezia or melena. No history of AAA. No history of pancreas disease. He denies abusing alcohol and no history of diabetes. He also denies any numbness or weakness.     Past Medical History  Diagnosis Date  . Depression   . Seizures    History reviewed. No pertinent past surgical history. Family History  Problem Relation Age of Onset  . Heart disease Mother    History  Substance Use Topics  . Smoking status: Never Smoker   . Smokeless tobacco: Not on file  . Alcohol Use: No    Review of Systems  All other systems reviewed and are negative.     Allergies  Penicillins and Codeine  Home Medications   Prior to Admission medications   Medication Sig Start Date End Date Taking? Authorizing Provider  amoxicillin (AMOXIL) 500 MG capsule Take 1 capsule (500 mg total) by mouth 3 (three)  times daily. Patient not taking: Reported on 11/28/2014 11/19/14   Ashley Murrain, NP  divalproex (DEPAKOTE ER) 250 MG 24 hr tablet Take 1 tablet (250 mg total) by mouth at bedtime. 11/27/14   Cameron Sprang, MD  LamoTRIgine (LAMICTAL XR) 25 MG TB24 tablet Take 1 tablet daily for 2 weeks, then increase to 2 tablets daily for 2 weeks, then increase to 4 tablets daily and continue 11/27/14   Cameron Sprang, MD  naproxen (NAPROSYN) 500 MG tablet Take 1 tablet (500 mg total) by mouth 2 (two) times daily. 11/19/14   Hope Bunnie Pion, NP  oxyCODONE-acetaminophen (PERCOCET) 7.5-325 MG per tablet Take one tablet PO every 4 to 6 hours as needed for pain 11/19/14   [facility], NP  traMADol (ULTRAM) 50 MG tablet Take 1 tablet (50 mg total) by mouth every 12 (twelve) hours as needed. 11/28/14   Lance Bosch, NP   BP 117/74 mmHg  Pulse 57  Temp(Src) 98.7 F (37.1 C) (Oral)  Resp 16  Ht 6' (1.829 m)  Wt 155 lb 6.4 oz (70.489 kg)  BMI 21.07 kg/m2  SpO2 99% Physical Exam  Constitutional: He is oriented to person, place, and time. He appears well-developed and well-nourished. No distress.  African-American male laying in bed without any discomfort.  HENT:  Head: Atraumatic.  Eyes: Conjunctivae are normal.  Neck: Neck supple.  Cardiovascular: Intact distal pulses.   bradycardia without murmurs rubs or gallops  Pulmonary/Chest: Effort normal and breath sounds normal.  Abdominal: Soft. Bowel sounds are normal. He exhibits no distension. There is no tenderness.  Musculoskeletal: He exhibits tenderness (mild mid thoracic spine tenderness without crepitus or step-off. Full range of motion and no overlying skin changes.).  Neurological: He is alert and oriented to person, place, and time.  Skin: No rash noted.  Psychiatric: He has a normal mood and affect.  Nursing note and vitals reviewed.   ED Course  Procedures (including critical care time)  Patient here with mid back pain abdominal pain. However on exam he  has no significant abdominal discomfort, no abdominal bruit or pulsatile mass concerning for aortic dissection or AAA. Mid back pain is mild without any crepitus or step off. His pain may be due to muscle schedule strain from helping lifting his girlfriend however he has no radicular pain concerning for nerve impingement. Is afebrile. Symptoms does not suggest pancreatitis. He also endorsed constipation but able to pass flatus and does not have any prior history of abdominal surgery concerning for small bowel obstruction. X-ray demonstrate moderate stool burden. I recommend patient to go for a narcotic pain medication as it can worsen his constipation. Patient will be discharged with relaxed, NSAIDs, and muscle relaxant. Recommend follow-up with PCP as needed for further care. Return precautions discussed. Labs Review Labs Reviewed - No data to display  Imaging Review Dg Abd 2 Views  12/21/2014   CLINICAL DATA:  Mid back pain since yesterday. No known injury. Initial encounter.  EXAM: ABDOMEN - 2 VIEW  COMPARISON:  Scout film from upper GI series 0 11/12/2004.  FINDINGS: There is no free intraperitoneal air. The bowel gas pattern is nonobstructive. Moderate stool burden is noted. No unexpected abdominal calcification is seen.  IMPRESSION: No acute abnormality.  Moderate stool burden.   Electronically Signed   By: Inge Rise M.D.   On: 12/21/2014 16:10     EKG Interpretation None      MDM   Final diagnoses:  Other constipation  Midline thoracic back pain    BP 132/81 mmHg  Pulse 48  Temp(Src) 98.7 F (37.1 C) (Oral)  Resp 16  Ht 6' (1.829 m)  Wt 155 lb 6.4 oz (70.489 kg)  BMI 21.07 kg/m2  SpO2 100%  I have reviewed nursing notes and vital signs. I personally viewed the imaging tests through PACS system and agrees with radiologist's intepretation I reviewed available ER/hospitalization records through the EMR     Domenic Moras, PA-C 12/21/14 1926  Sherwood Gambler,  MD 12/22/14 [phone]

## 2014-12-21 NOTE — Discharge Instructions (Signed)

## 2014-12-21 NOTE — ED Notes (Signed)
He c/o mid back pain since yesterday. He denies any activity at onset. Percocet relieved the pain, movement increases the pain. He reports constipation x 2 days, did havE BM yesterday but it was harder than nrmal. Denies urinary changes, abd pain.

## 2014-12-25 ENCOUNTER — Ambulatory Visit (HOSPITAL_COMMUNITY)
Admission: RE | Admit: 2014-12-25 | Discharge: 2014-12-25 | Disposition: A | Discharge status: Home / Self Care | Payer: Medicaid Other | Source: Ambulatory Visit | Attending: Neurology | Admitting: Neurology

## 2014-12-25 ENCOUNTER — Other Ambulatory Visit: Payer: Self-pay | Admitting: Neurology

## 2014-12-25 DIAGNOSIS — G40309 Generalized idiopathic epilepsy and epileptic syndromes, not intractable, without status epilepticus: Secondary | ICD-10-CM

## 2014-12-26 ENCOUNTER — Telehealth: Payer: Self-pay | Admitting: Family Medicine

## 2014-12-26 DIAGNOSIS — G40309 Generalized idiopathic epilepsy and epileptic syndromes, not intractable, without status epilepticus: Secondary | ICD-10-CM

## 2014-12-26 MED ORDER — DIAZEPAM 5 MG PO TABS: ORAL_TABLET | ORAL | Status: DC

## 2014-12-26 NOTE — Telephone Encounter (Signed)
Called patient back to give him new appt information. He is scheduled @ [facility] on 01/13/15 @ 9:15 am. [place], [place] [address]. Phone [phone].

## 2014-12-26 NOTE — Telephone Encounter (Signed)
Spoke with patient about rescheduling MRI Scan. I'm going to call [facility] and have patient scheduled there for the open scanner. Will also fax Rx to his pharmacy for Valium. Will call patient back with new appt information.

## 2014-12-26 NOTE — Telephone Encounter (Signed)
-----   Message from Cameron Sprang, MD sent at 12/26/2014 10:04 AM EDT ----- Regarding: mri Got note from MRI that pt claustrophobic and could not do MRI. Can you pls schedule for open MRI and can give 1 dose Valium 5mg  before study. Pls confirm with pt/family if okay to proceed, thanks

## 2014-12-30 ENCOUNTER — Ambulatory Visit: Payer: Self-pay | Admitting: Neurology

## 2014-12-30 DIAGNOSIS — Z9289 Personal history of other medical treatment: Secondary | ICD-10-CM

## 2014-12-30 HISTORY — DX: Personal history of other medical treatment: Z92.89

## 2015-01-03 ENCOUNTER — Telehealth: Payer: Self-pay | Admitting: *Deleted

## 2015-01-03 NOTE — Telephone Encounter (Signed)
Patient scheduled for Mri on Monday it has been approved for Franklin [name] however he is going to [facility]. Please call [facility] with the corrected approval

## 2015-01-03 NOTE — Telephone Encounter (Signed)
Returned call to [facility] to notify them that authorization facility has been changed to list their facility.

## 2015-01-06 ENCOUNTER — Emergency Department (HOSPITAL_COMMUNITY)
Admission: EM | Admit: 2015-01-06 | Discharge: 2015-01-06 | Discharge status: Against Medical Advice | Payer: Medicaid Other | Attending: Emergency Medicine | Admitting: Emergency Medicine

## 2015-01-06 ENCOUNTER — Encounter (HOSPITAL_COMMUNITY): Payer: Self-pay | Admitting: Emergency Medicine

## 2015-01-06 ENCOUNTER — Emergency Department (HOSPITAL_COMMUNITY): Payer: Medicaid Other

## 2015-01-06 DIAGNOSIS — Z791 Long term (current) use of non-steroidal anti-inflammatories (NSAID): Secondary | ICD-10-CM | POA: Diagnosis not present

## 2015-01-06 DIAGNOSIS — F329 Major depressive disorder, single episode, unspecified: Secondary | ICD-10-CM | POA: Insufficient documentation

## 2015-01-06 DIAGNOSIS — R079 Chest pain, unspecified: Secondary | ICD-10-CM | POA: Diagnosis present

## 2015-01-06 DIAGNOSIS — Z79899 Other long term (current) drug therapy: Secondary | ICD-10-CM | POA: Insufficient documentation

## 2015-01-06 DIAGNOSIS — Z880 Allergy status to penicillin: Secondary | ICD-10-CM | POA: Insufficient documentation

## 2015-01-06 DIAGNOSIS — G40909 Epilepsy, unspecified, not intractable, without status epilepticus: Secondary | ICD-10-CM | POA: Diagnosis not present

## 2015-01-06 LAB — BASIC METABOLIC PANEL
Anion gap: 6 (ref 5–15)
BUN: 13 mg/dL (ref 6–20)
CO2: 29 mmol/L (ref 22–32)
Calcium: 9.4 mg/dL (ref 8.9–10.3)
Chloride: 103 mmol/L (ref 101–111)
Creatinine, Ser: 1.01 mg/dL (ref 0.61–1.24)
GFR calc non Af Amer: >60 mL/min (ref >60)
Glucose, Bld: 77 mg/dL (ref 65–99)
POTASSIUM: 3.7 mmol/L (ref 3.5–5.1)
Sodium: 138 mmol/L (ref 135–145)

## 2015-01-06 LAB — CBC
HCT: 42.7 % (ref 39.0–52.0)
Hemoglobin: 14.8 g/dL (ref 13.0–17.0)
MCH: 30 pg (ref 26.0–34.0)
MCHC: 34.7 g/dL (ref 30.0–36.0)
MCV: 86.6 fL (ref 78.0–100.0)
PLATELETS: 161 10*3/uL (ref 150–400)
RBC: 4.93 MIL/uL (ref 4.22–5.81)
RDW: 12.6 % (ref 11.5–15.5)
WBC: 4.4 10*3/uL (ref 4.0–10.5)

## 2015-01-06 NOTE — ED Notes (Signed)
Pt. Stated, I started having chest pain this morning. It felt sharp , it went away and then came back.  Denies any other symptom. Pt stated, he has had the same episode and the doctor said it was too much walking.

## 2015-01-06 NOTE — ED Notes (Signed)
Pt states he does not want to stay. Leaving AMA

## 2015-01-06 NOTE — ED Notes (Signed)
Report to Gorman,RN

## 2015-01-06 NOTE — ED Notes (Signed)
Patient eating and talking on phone, no complaints at this time.

## 2015-01-06 NOTE — ED Provider Notes (Signed)
CSN: [id number]     Arrival date & time 01/06/15  4098 History   First MD Initiated Contact with Patient 01/06/15 1005     Chief Complaint  Patient presents with  . Chest Pain     (Consider location/radiation/quality/duration/timing/severity/associated sxs/prior Treatment) The history is provided by the patient and medical records.    This is a 34 year old male with history of depression and seizure disorder, presenting to the ED for chest pain. Patient states pain began while walking down the sidewalk this morning. He denies any other strenuous activity. He states pain lasted "maybe 4 minutes, but not sure".  He states pain went away but came back for another few seconds and resolved again. Denies pain currently.  He denies any SOB, palpitations, dizziness, weakness, nausea, vomiting, diaphoresis during his episodes of pain or currently.  No fever, chills, sweats.  No known cardiac hx.  Patient is not a smoker.  No known family cardiac hx.  VSS.  Past Medical History  Diagnosis Date  . Depression   . Seizures    History reviewed. No pertinent past surgical history. Family History  Problem Relation Age of Onset  . Heart disease Mother    History  Substance Use Topics  . Smoking status: Never Smoker   . Smokeless tobacco: Not on file  . Alcohol Use: No    Review of Systems  Cardiovascular: Positive for chest pain.  All other systems reviewed and are negative.     Allergies  Penicillins and Codeine  Home Medications   Prior to Admission medications   Medication Sig Start Date End Date Taking? Authorizing Provider  acetaminophen (TYLENOL) 500 MG tablet Take 1,000 mg by mouth every 6 (six) hours as needed (pain).    Historical Provider, MD  albuterol (PROVENTIL HFA;VENTOLIN HFA) 108 (90 BASE) MCG/ACT inhaler Inhale 2 puffs into the lungs every 6 (six) hours as needed for wheezing or shortness of breath (asthma symptoms).    Historical Provider, MD  diazepam (VALIUM) 5 MG  tablet Take 1 tablet 30 minutes before MRI scan, may repeat with 2nd tablet if needed. 12/26/14   Cameron Sprang, MD  divalproex (DEPAKOTE ER) 250 MG 24 hr tablet Take 1 tablet (250 mg total) by mouth at bedtime. 11/27/14   Cameron Sprang, MD  fluticasone (FLONASE) 50 MCG/ACT nasal spray Place 2 sprays into both nostrils daily as needed for allergies (runny nose).  11/21/14   Historical Provider, MD  ibuprofen (ADVIL,MOTRIN) 200 MG tablet Take 400 mg by mouth every 6 (six) hours as needed (pain).    Historical Provider, MD  LamoTRIgine (LAMICTAL XR) 25 MG TB24 tablet Take 1 tablet daily for 2 weeks, then increase to 2 tablets daily for 2 weeks, then increase to 4 tablets daily and continue Patient taking differently: Take 100 mg by mouth at bedtime.  11/27/14   Cameron Sprang, MD  methocarbamol (ROBAXIN) 500 MG tablet Take 1 tablet (500 mg total) by mouth 2 (two) times daily. 12/21/14   Domenic Moras, PA-C  naproxen (NAPROSYN) 500 MG tablet Take 1 tablet (500 mg total) by mouth 2 (two) times daily. 12/21/14   Domenic Moras, PA-C  oxyCODONE-acetaminophen (PERCOCET) 7.5-325 MG per tablet Take one tablet PO every 4 to 6 hours as needed for pain 11/19/14   [facility], NP  polyethylene glycol (MIRALAX / GLYCOLAX) packet Take 17 g by mouth daily. 12/21/14   Domenic Moras, PA-C  traMADol (ULTRAM) 50 MG tablet Take 1 tablet (50 mg  total) by mouth every 12 (twelve) hours as needed. Patient not taking: Reported on 12/21/2014 11/28/14   Lance Bosch, NP   BP 129/74 mmHg  Pulse 55  Resp 20  SpO2 99%   Physical Exam  Constitutional: He is oriented to person, place, and time. He appears well-developed and well-nourished. No distress.  Watching tv and texting on cell phone during exam  HENT:  Head: Normocephalic and atraumatic.  Mouth/Throat: Oropharynx is clear and moist.  Eyes: Conjunctivae and EOM are normal. Pupils are equal, round, and reactive to light.  Neck: Normal range of motion. Neck supple.  Cardiovascular:  Normal rate, regular rhythm and normal heart sounds.   Pulmonary/Chest: Effort normal and breath sounds normal. No respiratory distress. He has no wheezes.  Abdominal: Soft. Bowel sounds are normal. There is no tenderness. There is no guarding.  Musculoskeletal: Normal range of motion.  Neurological: He is alert and oriented to person, place, and time.  Skin: Skin is warm and dry. He is not diaphoretic.  Psychiatric: He has a normal mood and affect.  Nursing note and vitals reviewed.   ED Course  Procedures (including critical care time) Labs Review Labs Reviewed  BASIC METABOLIC PANEL  CBC  I-STAT [place], ED    Imaging Review Dg Chest 2 View  01/06/2015   CLINICAL DATA:  Chest pain.  EXAM: CHEST  2 VIEW  COMPARISON:  10/09/2014  FINDINGS: Heart size and pulmonary vascularity are normal and the lungs are clear. No acute osseous abnormality. Chronic slight thoracic scoliosis. Trachea slightly deviated to the left just above the thoracic inlet. This may be secondary to the scoliosis but enlargement of the right lobe of the thyroid gland could give this appearance. This is more prominent than on 02/04/2012.  IMPRESSION: No acute abnormalities. Possible enlargement of the right lobe of the thyroid gland.   Electronically Signed   By: Lorriane Shire M.D.   On: 01/06/2015 10:59     EKG Interpretation   Date/Time:  Monday January 06 2015 10:00:33 EDT Ventricular Rate:  73 PR Interval:  166 QRS Duration: 102 QT Interval:  376 QTC Calculation: 414 R Axis:   17 Text Interpretation:  Normal sinus rhythm Normal ECG Confirmed by Hazle Coca [phone]) on 01/06/2015 10:12:54 AM      MDM   Final diagnoses:  Chest pain, unspecified chest pain type   34 year old male here with chest pain. Multiple visits over the past 6 months for various complaints. No known cardiac history. EKG normal sinus rhythm, no concerning ischemic changes. Patient's symptoms seem very atypical. He denies any pain  currently. Will obtain labs and CXR.  Patient given meal here in ED and drink at his request.  He is watching tv currently, NAD  12:43 PM Notified that patient eloped from the ED.  Troponin still pending at this time, CBC and BMP are reassuring.  CXR appears clear.  Did not get to discuss risks of leaving AMA as patient had already left facility.  Larene Pickett, PA-C 01/06/15 1352  Quintella Reichert, MD 01/06/15 [phone]

## 2015-01-06 NOTE — Procedures (Signed)
ELECTROENCEPHALOGRAM REPORT  Date of Study: 12/18/2014  Patient's Name: Franklin [name] MRN: [id number] Date of Birth: [birth year]  Referring Provider: Dr. Ellouise Newer  Clinical History: This is a 34 year old man with episodes of staring followed by shaking, he has had urinary incontinence with some of them, some have lasted 1-1/2 hours. EEG for classification  Medications: Depakote, Percocet, Naproxen  Technical Summary: A multichannel digital 1-hour sleep deprived EEG recording measured by the international 10-20 system with electrodes applied with paste and impedances below 5000 ohms performed in our laboratory with EKG monitoring in an awake and asleep patient.  Hyperventilation and photic stimulation were performed.  The digital EEG was referentially recorded, reformatted, and digitally filtered in a variety of bipolar and referential montages for optimal display.    Description: The patient is awake and asleep during the recording.  During maximal wakefulness, there is a symmetric, medium voltage 10 Hz posterior dominant rhythm that attenuates with eye opening.  The record is symmetric.  During drowsiness and sleep, there is an increase in theta slowing of the background.  Vertex waves and symmetric sleep spindles were seen.  Hyperventilation and photic stimulation did not elicit any abnormalities.  There were no epileptiform discharges or electrographic seizures seen.    EKG lead showed sinus bradycardia at 42 bpm.  Impression: This 1-hour awake and asleep EEG is normal.    Clinical Correlation: A normal EEG does not exclude a clinical diagnosis of epilepsy.  If further clinical questions remain, prolonged EEG may be helpful.  Clinical correlation is advised.   Ellouise Newer, M.D.

## 2015-01-07 ENCOUNTER — Encounter (HOSPITAL_COMMUNITY): Payer: Self-pay | Admitting: Emergency Medicine

## 2015-01-07 ENCOUNTER — Emergency Department (HOSPITAL_COMMUNITY)
Admission: EM | Admit: 2015-01-07 | Discharge: 2015-01-07 | Disposition: A | Discharge status: Home / Self Care | Payer: Medicaid Other | Attending: Emergency Medicine | Admitting: Emergency Medicine

## 2015-01-07 DIAGNOSIS — F419 Anxiety disorder, unspecified: Secondary | ICD-10-CM | POA: Diagnosis not present

## 2015-01-07 DIAGNOSIS — R079 Chest pain, unspecified: Secondary | ICD-10-CM | POA: Insufficient documentation

## 2015-01-07 DIAGNOSIS — F329 Major depressive disorder, single episode, unspecified: Secondary | ICD-10-CM | POA: Insufficient documentation

## 2015-01-07 DIAGNOSIS — Z79899 Other long term (current) drug therapy: Secondary | ICD-10-CM | POA: Insufficient documentation

## 2015-01-07 DIAGNOSIS — G40909 Epilepsy, unspecified, not intractable, without status epilepticus: Secondary | ICD-10-CM | POA: Insufficient documentation

## 2015-01-07 DIAGNOSIS — Z880 Allergy status to penicillin: Secondary | ICD-10-CM | POA: Insufficient documentation

## 2015-01-07 DIAGNOSIS — R0602 Shortness of breath: Secondary | ICD-10-CM | POA: Diagnosis not present

## 2015-01-07 DIAGNOSIS — Z7951 Long term (current) use of inhaled steroids: Secondary | ICD-10-CM | POA: Diagnosis not present

## 2015-01-07 LAB — I-STAT TROPONIN, ED: Troponin i, poc: 0 ng/mL (ref 0.00–0.08)

## 2015-01-07 MED ORDER — CEPHALEXIN 500 MG PO CAPS: 500.0000 mg | ORAL_CAPSULE | Freq: Once | ORAL | Status: DC

## 2015-01-07 NOTE — ED Notes (Signed)
Per EMS pt complaint of central chest pain described as sharp; denies associated symptoms other than pain.

## 2015-01-07 NOTE — ED Notes (Signed)
Bed: WS56 Expected date:  Expected time:  Means of arrival:  Comments: EMS- 34 yo M chest pain x 2 days

## 2015-01-07 NOTE — Discharge Instructions (Signed)

## 2015-01-07 NOTE — ED Provider Notes (Signed)
CSN: [id number]     Arrival date & time 01/07/15  1535 History   First MD Initiated Contact with Patient 01/07/15 1558     Chief Complaint  Patient presents with  . Chest Pain     (Consider location/radiation/quality/duration/timing/severity/associated sxs/prior Treatment) HPI   Franklin [name] is a 34 y.o. male who presents for evaluation of intermittent chest pain, which lasts for 1 hour, for 1 month. There are no specific known provocations. He has occasional unrelated shortness of breath. He states that he has not seen a doctor for it, even though he was seen yesterday at [facility] emergency department. He states that he has taken over-the-counter medicines but they have not helped. He states that he is taking his seizure medicine as directed. He had a seizure yesterday. He came here today, by EMS. He denies cough, fever, chills, nausea, vomiting, weakness or dizziness. There are no other known modifying factors.   Past Medical History  Diagnosis Date  . Depression   . Seizures    History reviewed. No pertinent past surgical history. Family History  Problem Relation Age of Onset  . Heart disease Mother    History  Substance Use Topics  . Smoking status: Never Smoker   . Smokeless tobacco: Not on file  . Alcohol Use: No    Review of Systems  All other systems reviewed and are negative.     Allergies  Penicillins and Codeine  Home Medications   Prior to Admission medications   Medication Sig Start Date End Date Taking? Authorizing Provider  albuterol (PROVENTIL HFA;VENTOLIN HFA) 108 (90 BASE) MCG/ACT inhaler Inhale 2 puffs into the lungs every 6 (six) hours as needed for wheezing or shortness of breath (asthma symptoms).    Historical Provider, MD  diazepam (VALIUM) 5 MG tablet Take 1 tablet 30 minutes before MRI scan, may repeat with 2nd tablet if needed. Patient not taking: Reported on 01/06/2015 12/26/14   Cameron Sprang, MD  divalproex (DEPAKOTE ER) 250 MG 24 hr  tablet Take 1 tablet (250 mg total) by mouth at bedtime. 11/27/14   Cameron Sprang, MD  fluticasone (FLONASE) 50 MCG/ACT nasal spray Place 2 sprays into both nostrils daily as needed for allergies (runny nose).  11/21/14   Historical Provider, MD  ibuprofen (ADVIL,MOTRIN) 200 MG tablet Take 400 mg by mouth every 6 (six) hours as needed (pain).    Historical Provider, MD  LamoTRIgine (LAMICTAL XR) 25 MG TB24 tablet Take 1 tablet daily for 2 weeks, then increase to 2 tablets daily for 2 weeks, then increase to 4 tablets daily and continue Patient taking differently: Take 100 mg by mouth at bedtime.  11/27/14   Cameron Sprang, MD  methocarbamol (ROBAXIN) 500 MG tablet Take 1 tablet (500 mg total) by mouth 2 (two) times daily. Patient not taking: Reported on 01/06/2015 12/21/14   Domenic Moras, PA-C  naproxen (NAPROSYN) 500 MG tablet Take 1 tablet (500 mg total) by mouth 2 (two) times daily. Patient not taking: Reported on 01/06/2015 12/21/14   Domenic Moras, PA-C  oxyCODONE-acetaminophen (PERCOCET) 7.5-325 MG per tablet Take one tablet PO every 4 to 6 hours as needed for pain 11/19/14   [facility], NP  polyethylene glycol (MIRALAX / GLYCOLAX) packet Take 17 g by mouth daily. 12/21/14   Domenic Moras, PA-C  traMADol (ULTRAM) 50 MG tablet Take 1 tablet (50 mg total) by mouth every 12 (twelve) hours as needed. 11/28/14   Lance Bosch, NP  BP 115/69 mmHg  Pulse 55  Temp(Src) 98.3 F (36.8 C) (Oral)  Resp 16  SpO2 99% Physical Exam  Constitutional: He is oriented to person, place, and time. He appears well-developed and well-nourished. No distress.  HENT:  Head: Normocephalic and atraumatic.  Right Ear: External ear normal.  Left Ear: External ear normal.  No trauma to scalp or face.  Eyes: Conjunctivae and EOM are normal. Pupils are equal, round, and reactive to light.  Neck: Normal range of motion and phonation normal. Neck supple.  Cardiovascular: Normal rate, regular rhythm and normal heart sounds.    Pulmonary/Chest: Effort normal and breath sounds normal. He exhibits no bony tenderness.  Abdominal: Soft. There is no tenderness.  Musculoskeletal: Normal range of motion. He exhibits no edema.  Skin of upper back bilaterally is very tender to light touch. There are no deformities of the anterior or posterior thorax. He has pain in the upper back, with movement from supine to sitting. He is able to sit and support his weight without difficulty.  Neurological: He is alert and oriented to person, place, and time. No cranial nerve deficit or sensory deficit. He exhibits normal muscle tone. Coordination normal.  Skin: Skin is warm, dry and intact.  Psychiatric: His behavior is normal.  Anxious  Nursing note and vitals reviewed.   ED Course  Procedures (including critical care time)  Patient was confronted about his visit to the emergency department yesterday, then he admitted he had indeed seen someone there but walked out before receiving the results, or discharge instructions. He was informed of the findings, which were normal. He was informed that there is no indication for further evaluation, since he had a comprehensive evaluation yesterday. He was allowed ask further questions, did not and agreed to discharge.  Medications - No data to display  Patient Vitals for the past 24 hrs:  BP Temp Temp src Pulse Resp SpO2  01/07/15 1542 115/69 mmHg 98.3 F (36.8 C) Oral (!) 55 16 99 %  01/07/15 1538 - - - - - 98 %       Labs Review Labs Reviewed  Randolm Idol, ED      Imaging Review Dg Chest 2 View  01/06/2015   CLINICAL DATA:  Chest pain.  EXAM: CHEST  2 VIEW  COMPARISON:  10/09/2014  FINDINGS: Heart size and pulmonary vascularity are normal and the lungs are clear. No acute osseous abnormality. Chronic slight thoracic scoliosis. Trachea slightly deviated to the left just above the thoracic inlet. This may be secondary to the scoliosis but enlargement of the right lobe of the  thyroid gland could give this appearance. This is more prominent than on 02/04/2012.  IMPRESSION: No acute abnormalities. Possible enlargement of the right lobe of the thyroid gland.   Electronically Signed   By: Lorriane Shire M.D.   On: 01/06/2015 10:59     EKG Interpretation   Date/Time:  Tuesday January 07 2015 15:42:25 EDT Ventricular Rate:  53 PR Interval:  160 QRS Duration: 120 QT Interval:  411 QTC Calculation: 386 R Axis:   -25 Text Interpretation:  Sinus rhythm Probable left ventricular hypertrophy  ST elevation suggests acute pericarditis since last tracing no significant  change Confirmed by Eulis Foster  MD, Vira Agar ([id number]) on 01/07/2015 4:06:36 PM      MDM   Final diagnoses:  Nonspecific chest pain    Noncardiac chest pain with negative workup yesterday. Patient has chest wall discomfort, to light touch. Doubt ACS, PE, pneumonia, metabolic  instability or serous bacterial infection.  Nursing Notes Reviewed/ Care Coordinated Applicable Imaging Reviewed Interpretation of Laboratory Data incorporated into ED treatment  The patient appears reasonably screened and/or stabilized for discharge and I doubt any other medical condition or other [facility] requiring further screening, evaluation, or treatment in the ED at this time prior to discharge.  Plan: Home Medications- usual; Home Treatments- rest; return here if the recommended treatment, does not improve the symptoms; Recommended follow up- PCP prn     Daleen Bo, MD 01/07/15 [phone]

## 2015-01-08 ENCOUNTER — Encounter: Payer: Self-pay | Admitting: Neurology

## 2015-01-08 ENCOUNTER — Ambulatory Visit (INDEPENDENT_AMBULATORY_CARE_PROVIDER_SITE_OTHER): Payer: Medicaid Other | Admitting: Neurology

## 2015-01-08 VITALS — BP 118/78 | HR 55 | Ht 72.0 in | Wt 156.0 lb

## 2015-01-08 DIAGNOSIS — F319 Bipolar disorder, unspecified: Secondary | ICD-10-CM

## 2015-01-08 DIAGNOSIS — G40309 Generalized idiopathic epilepsy and epileptic syndromes, not intractable, without status epilepticus: Secondary | ICD-10-CM

## 2015-01-08 MED ORDER — LAMOTRIGINE ER 100 MG PO TB24: ORAL_TABLET | ORAL | Status: DC

## 2015-01-08 NOTE — Progress Notes (Signed)
NEUROLOGY FOLLOW UP OFFICE NOTE  Franklin [name] [id number]  HISTORY OF PRESENT ILLNESS: I had the pleasure of seeing Franklin [name] in follow-up in the neurology clinic on 01/08/2015.  The patient was last seen 6 weeks ago for seizures. Records and images were personally reviewed where available.  His 1-hour sleep-deprived EEG was normal. He was started on Lamotrigine ER on his last visit, currently at 100mg  daily. He continues on low dose Depakote ER 250mg  daily. Since his last visit, he reports a total of 4 seizures, last seizure was yesterday. He was getting out of bed and felt weak, then "started kicking and shaking" per girlfriend. He is amnestic of this and reports coming to strapped up in the stretcher. He had urinary incontinence. On review of EPIC notes, he was in the ER the past 2 days. On 8/8, he presented for chest pain while walking. He had a CXR, EKG, and bloodwork which was normal, then left AMA. He went back to the ER yesterday, again reporting chest pain, troponin negative, felt to be non-cardiac. He told the ER he had a seizure on 8/8.   He reports dizziness when standing, where he feels the room is spinning for about an hour. This may or may not be associated with migraines. He denies any falls with these. He denies any diplopia, focal numbness/tingling/weakness.   HPI: This is a 34 yo RH man with a history of bipolar disorder, mild cognitive impairment, and seizures since age 31 or 33. He and his cousin both report that seizures would start with staring and unresponsiveness, followed by low amplitude shaking of both hands. Sometimes he would have violent leg kicking. They state that shaking last from 30 minutes up to "1-1/2 hours" followed by confusion for around 20 minutes. He would be sleepy after a seizure. He has had urinary incontinence with some of them, no tongue bite or other significant injuries. He reports all seizures occur during wakefulness, no nocturnal seizures that he  is aware of, but woke up one time with urinary incontinence. He denies any olfactory/gustatory hallucinations, deja vu, rising epigastric sensation, focal numbness/tingling/weakness, myoclonic jerks. He occasionally drops things from his hands. He reports that Depakote was started at age 32, however he has only been taking 250mg /day, stating that when he was on 500 or 750mg , he would be "like a zombie, walking into doors." The low dose Depakote has not controlled the seizures, he reports seizures occurring every 3-4 days, longest seizure-free interval probably 1-2 months.   On review of records on EPIC, he has been to the ER several times since 2008 with no note of a seizure history until 2014. There are some records of patient being on Depakote, Tegretol, and Haldol, presumably for Bipolar disorder. He reports chronic headaches occurring 2-3 times a week with sharp pain in the back of his head, "like a needle sticking in it" lasting 30-45 minutes. He would feel lightheaded. No associated nausea, vomiting, photo/phonophobia, or visual obscurations. He takes Tylenol or aspirin with good effect. He lives with his girlfriend. He finished 12th grade in special education classes. He does not drive.  Epilepsy Risk Factors: His maternal uncle used to have seizures. He was born premature, with mild cognitive impairment. Otherwise he denies any history of febrile convulsions, CNS infections such as meningitis/encephalitis, significant traumatic brain injury, neurosurgical procedures.  PAST MEDICAL HISTORY: Past Medical History  Diagnosis Date  . Depression   . Seizures     MEDICATIONS: Current Outpatient Prescriptions on  File Prior to Visit  Medication Sig Dispense Refill  . albuterol (PROVENTIL HFA;VENTOLIN HFA) 108 (90 BASE) MCG/ACT inhaler Inhale 2 puffs into the lungs every 6 (six) hours as needed for wheezing or shortness of breath (asthma symptoms).    . diazepam (VALIUM) 5 MG tablet Take 1 tablet 30  minutes before MRI scan, may repeat with 2nd tablet if needed. 2 tablet 0  . divalproex (DEPAKOTE ER) 250 MG 24 hr tablet Take 1 tablet (250 mg total) by mouth at bedtime. 30 tablet 6  . fluticasone (FLONASE) 50 MCG/ACT nasal spray Place 2 sprays into both nostrils daily as needed for allergies (runny nose).   1  . ibuprofen (ADVIL,MOTRIN) 200 MG tablet Take 400 mg by mouth every 6 (six) hours as needed (pain).    . LamoTRIgine (LAMICTAL XR) 25 MG TB24 tablet Take 1 tablet daily for 2 weeks, then increase to 2 tablets daily for 2 weeks, then increase to 4 tablets daily and continue (Patient taking differently: Take 100 mg by mouth at bedtime. ) 120 tablet 1  . oxyCODONE-acetaminophen (PERCOCET) 7.5-325 MG per tablet Take one tablet PO every 4 to 6 hours as needed for pain (Patient not taking: Reported on 01/08/2015) 20 tablet 0   No current facility-administered medications on file prior to visit.    ALLERGIES: Allergies  Allergen Reactions  . Penicillins Nausea And Vomiting  . Codeine Nausea And Vomiting    FAMILY HISTORY: Family History  Problem Relation Age of Onset  . Heart disease Mother     SOCIAL HISTORY: Social History   Social History  . Marital Status: Single    Spouse Name: N/A  . Number of Children: N/A  . Years of Education: N/A   Occupational History  . Not on file.   Social History Main Topics  . Smoking status: Never Smoker   . Smokeless tobacco: Never Used  . Alcohol Use: No  . Drug Use: No  . Sexual Activity: Not Currently   Other Topics Concern  . Not on file   Social History Narrative    REVIEW OF SYSTEMS: Constitutional: No fevers, chills, or sweats, no generalized fatigue, change in appetite Eyes: No visual changes, double vision, eye pain Ear, nose and throat: No hearing loss, ear pain, nasal congestion, sore throat Cardiovascular: No chest pain, palpitations Respiratory:  No shortness of breath at rest or with exertion,  wheezes GastrointestinaI: + nausea, vomiting, no diarrhea, abdominal pain, fecal incontinence Genitourinary:  No dysuria, urinary retention or frequency Musculoskeletal:  + neck pain, back pain Integumentary: No rash, pruritus, skin lesions Neurological: as above Psychiatric: No depression, insomnia, anxiety Endocrine: No palpitations, fatigue, diaphoresis, mood swings, change in appetite, change in weight, increased thirst Hematologic/Lymphatic:  No anemia, purpura, petechiae. Allergic/Immunologic: no itchy/runny eyes, nasal congestion, recent allergic reactions, rashes  PHYSICAL EXAM: Filed Vitals:   01/08/15 0935  BP: 118/78  Pulse: 55   General: No acute distress Head:  Normocephalic/atraumatic Neck: supple, no paraspinal tenderness, full range of motion Heart:  Regular rate and rhythm Lungs:  Clear to auscultation bilaterally Back: No paraspinal tenderness Skin/Extremities: No rash, no edema Neurological Exam: alert and oriented to person, place, and time. No aphasia or dysarthria. Fund of knowledge is appropriate.  Recent and remote memory are intact. 3/3 delayed recall.  Attention and concentration are normal.    Able to name objects and repeat phrases. Cranial nerves: Pupils equal, round, reactive to light.  Fundoscopic exam unremarkable, no papilledema. Extraocular movements intact with  no nystagmus. Visual fields full. Facial sensation intact. No facial asymmetry. Tongue, uvula, palate midline.  Motor: Bulk and tone normal, muscle strength 5/5 throughout with no pronator drift.  Sensation to light touch intact.  No extinction to double simultaneous stimulation.  Deep tendon reflexes 2+ throughout, toes downgoing.  Finger to nose testing intact.  Gait narrow-based and steady, able to tandem walk adequately.  Romberg negative.  IMPRESSION: This is a 34 yo RH man with a history of bipolar disorder, mild cognitive impairment, with a history of seizures since age 64 or 24. Review of  records from ER visits in the past have no indication of seizures or seizure medication until 2014. He states he has never seen a neurologist in the past. Seizures consist of staring followed by shaking, he has had urinary incontinence with some of them, however the prolonged duration of some (lasting 1-1/2 hours) also raises the possibility of non-epileptic events. 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 His EEG is normal, MRI brain scheduled for next week. He is now on Lamotrigine ER 100mg  daily with no side effects, he reports having a seizure yesterday with urinary incontinence. Increase Lamotrigine ER to 200mg  daily, continue low dose Depakote ER 250mg  daily for now. We will plan to taper Depakote off on his next visit. If seizures continue, he will be scheduled for a 72-hour EEG to further classify his events. He will follow-up in 2 months and knows to call our office for any changes.   Thank you for allowing me to participate in his care.  Please do not hesitate to call for any questions or concerns.  The duration of this appointment visit was 24 minutes of face-to-face time with the patient.  Greater than 50% of this time was spent in counseling, explanation of diagnosis, planning of further management, and coordination of care.   Ellouise Newer, M.D.   CC: Chari Manning, NP

## 2015-01-08 NOTE — Patient Instructions (Signed)
1. Finish off your current bottle of Lamotrigine ER 25mg  by taking 8 tablets daily (total of 200mg ). When you finish your current bottle, the new prescription will be Lamotrigine ER 100mg : Take 2 tablets daily 2, Continue Depakote 250mg : Take 1 tablet daily 3. Follow-up in 2 months  Seizure Precautions: 1. If medication has been prescribed for you to prevent seizures, take it exactly as directed.  Do not stop taking the medicine without talking to your doctor first, even if you have not had a seizure in a long time.   2. Avoid activities in which a seizure would cause danger to yourself or to others.  Don't operate dangerous machinery, swim alone, or climb in high or dangerous places, such as on ladders, roofs, or girders.  Do not drive unless your doctor says you may.  3. If you have any warning that you may have a seizure, lay down in a safe place where you can't hurt yourself.    4.  No driving for 6 months from last seizure, as per [facility].   Please refer to the following link on the [facility] website for more information: http://[email or web address]/[email or web address]/drivingu.cfm   5.  Maintain good sleep hygiene.  6.  Contact your doctor if you have any problems that may be related to the medicine you are taking.  7.  Call 911 and bring the patient back to the ED if:        A.  The seizure lasts longer than 5 minutes.       B.  The patient doesn't awaken shortly after the seizure  C.  The patient has new problems such as difficulty seeing, speaking or moving  D.  The patient was injured during the seizure  E.  The patient has a temperature over 102 F (39C)  F.  The patient vomited and now is having trouble breathing

## 2015-01-10 ENCOUNTER — Encounter (HOSPITAL_COMMUNITY): Payer: Self-pay | Admitting: Emergency Medicine

## 2015-01-10 ENCOUNTER — Emergency Department (HOSPITAL_COMMUNITY)
Admission: EM | Admit: 2015-01-10 | Discharge: 2015-01-10 | Disposition: A | Discharge status: Home / Self Care | Payer: Medicaid Other | Attending: Emergency Medicine | Admitting: Emergency Medicine

## 2015-01-10 ENCOUNTER — Telehealth: Payer: Self-pay | Admitting: *Deleted

## 2015-01-10 DIAGNOSIS — Z79899 Other long term (current) drug therapy: Secondary | ICD-10-CM | POA: Diagnosis not present

## 2015-01-10 DIAGNOSIS — Z7951 Long term (current) use of inhaled steroids: Secondary | ICD-10-CM | POA: Diagnosis not present

## 2015-01-10 DIAGNOSIS — Z880 Allergy status to penicillin: Secondary | ICD-10-CM | POA: Diagnosis not present

## 2015-01-10 DIAGNOSIS — R569 Unspecified convulsions: Secondary | ICD-10-CM

## 2015-01-10 DIAGNOSIS — F319 Bipolar disorder, unspecified: Secondary | ICD-10-CM | POA: Diagnosis not present

## 2015-01-10 DIAGNOSIS — G40909 Epilepsy, unspecified, not intractable, without status epilepticus: Secondary | ICD-10-CM | POA: Insufficient documentation

## 2015-01-10 LAB — BASIC METABOLIC PANEL
Anion gap: 8 (ref 5–15)
BUN: 13 mg/dL (ref 6–20)
CALCIUM: 9.6 mg/dL (ref 8.9–10.3)
CO2: 26 mmol/L (ref 22–32)
CREATININE: 1.1 mg/dL (ref 0.61–1.24)
Chloride: 108 mmol/L (ref 101–111)
GFR calc Af Amer: >60 mL/min (ref >60)
GFR calc non Af Amer: >60 mL/min (ref >60)
Glucose, Bld: 100 mg/dL — ABNORMAL HIGH (ref 65–99)
Potassium: 3.4 mmol/L — ABNORMAL LOW (ref 3.5–5.1)
Sodium: 142 mmol/L (ref 135–145)

## 2015-01-10 LAB — CBC
HEMATOCRIT: 41.6 % (ref 39.0–52.0)
HEMOGLOBIN: 14.7 g/dL (ref 13.0–17.0)
MCH: 30.3 pg (ref 26.0–34.0)
MCHC: 35.3 g/dL (ref 30.0–36.0)
MCV: 85.8 fL (ref 78.0–100.0)
Platelets: 178 10*3/uL (ref 150–400)
RBC: 4.85 MIL/uL (ref 4.22–5.81)
RDW: 12.6 % (ref 11.5–15.5)
WBC: 4.9 10*3/uL (ref 4.0–10.5)

## 2015-01-10 LAB — CBG MONITORING, ED: Glucose-Capillary: 68 mg/dL (ref 65–99)

## 2015-01-10 LAB — VALPROIC ACID LEVEL

## 2015-01-10 MED ORDER — IBUPROFEN 400 MG PO TABS: 400.0000 mg | ORAL_TABLET | Freq: Four times a day (QID) | ORAL | Status: DC | PRN

## 2015-01-10 MED ORDER — POTASSIUM CHLORIDE CRYS ER 20 MEQ PO TBCR
40.0000 meq | EXTENDED_RELEASE_TABLET | Freq: Once | ORAL | Status: AC
  Administered 2015-01-10: 40 meq via ORAL
  Filled 2015-01-10: qty 2

## 2015-01-10 MED ORDER — IBUPROFEN 800 MG PO TABS
800.0000 mg | ORAL_TABLET | Freq: Once | ORAL | Status: AC
Start: 2015-01-10 — End: 2015-01-10
  Administered 2015-01-10: 800 mg via ORAL
  Filled 2015-01-10: qty 1

## 2015-01-10 NOTE — ED Provider Notes (Signed)
CSN: [id number]     Arrival date & time 01/10/15  1209 History   First MD Initiated Contact with Patient 01/10/15 1243     Chief Complaint  Patient presents with  . Seizures     HPI   Franklin [name] is a 34 y.o. male with a PMH of bipolar disorder, mild cognitive impairment, and seizures who presents to the ED with seizures. He was seen in clinic by [facility] 01/08/15 for the same complaint. He reports he was supposed to have an MRI this morning, but "couldn't do it" when he got to his appointment. He reports he went home and was sitting on the side of his bed watching TV and had a seizure that lasted for 45 minutes. He states his girlfriend told him that he stared off into space and started to shake "all over." He reports after his seizure, he was confused for about 45-55 minutes, and that he had fallen to the floor. He denies injury. He states he does not think he hit his head. He subsequently had another seizure, which he states lasted for 20 minutes, during which he stared off into space and started to shake "all over." He reports lightheadedness and dizziness. He states he currently has a headache across the front of his forehead. He denies fever, chills, chest pain, shortness of breath, nausea, vomiting, diarrhea, dysuria, urgency, frequency, bowel/bladder incontinence, tongue biting.    Past Medical History  Diagnosis Date  . Depression   . Seizures    History reviewed. No pertinent past surgical history. Family History  Problem Relation Age of Onset  . Heart disease Mother    Social History  Substance Use Topics  . Smoking status: Never Smoker   . Smokeless tobacco: Never Used  . Alcohol Use: No    Review of Systems  Constitutional: Negative for fever, chills, diaphoresis, activity change, appetite change and fatigue.  HENT:       Denies biting his tongue.  Eyes: Negative for visual disturbance.  Respiratory: Negative for cough and shortness of breath.    Cardiovascular: Negative for chest pain, palpitations and leg swelling.  Gastrointestinal: Negative for nausea, vomiting, abdominal pain, diarrhea and constipation.  Genitourinary: Negative for dysuria, urgency and frequency.  Musculoskeletal: Positive for myalgias and back pain. Negative for neck pain and neck stiffness.       Reports left sided back pain.  Skin: Negative for color change, pallor, rash and wound.  Neurological: Positive for dizziness, seizures, light-headedness and headaches. Negative for facial asymmetry, speech difficulty, weakness and numbness.       Reports headache distributed over forehead.      Allergies  Penicillins and Codeine  Home Medications   Prior to Admission medications   Medication Sig Start Date End Date Taking? Authorizing Provider  albuterol (PROVENTIL HFA;VENTOLIN HFA) 108 (90 BASE) MCG/ACT inhaler Inhale 2 puffs into the lungs every 6 (six) hours as needed for wheezing or shortness of breath (asthma symptoms).    Historical Provider, MD  diazepam (VALIUM) 5 MG tablet Take 1 tablet 30 minutes before MRI scan, may repeat with 2nd tablet if needed. 12/26/14   Cameron Sprang, MD  divalproex (DEPAKOTE ER) 250 MG 24 hr tablet Take 1 tablet (250 mg total) by mouth at bedtime. 11/27/14   Cameron Sprang, MD  fluticasone (FLONASE) 50 MCG/ACT nasal spray Place 2 sprays into both nostrils daily as needed for allergies (runny nose).  11/21/14   Historical Provider, MD  ibuprofen (ADVIL,MOTRIN) 200  MG tablet Take 400 mg by mouth every 6 (six) hours as needed (pain).    Historical Provider, MD  LamoTRIgine 100 MG TB24 Take 2 tablets daily 01/08/15   Cameron Sprang, MD  oxyCODONE-acetaminophen (PERCOCET) 7.5-325 MG per tablet Take one tablet PO every 4 to 6 hours as needed for pain Patient not taking: Reported on 01/08/2015 11/19/14   Ashley Murrain, NP    BP 108/68 mmHg  Pulse 49  Temp(Src) 98.7 F (37.1 C) (Oral)  Resp 15  Ht 6' (1.829 m)  Wt 145 lb (65.772 kg)   BMI 19.66 kg/m2  SpO2 97% Physical Exam  Constitutional: He is oriented to person, place, and time. He appears well-developed and well-nourished. No distress.  HENT:  Head: Normocephalic and atraumatic.  Right Ear: External ear normal.  Left Ear: External ear normal.  Nose: Nose normal.  Mouth/Throat: Uvula is midline, oropharynx is clear and moist and mucous membranes are normal.  No evidence of tongue biting.  Eyes: Conjunctivae and EOM are normal. Pupils are equal, round, and reactive to light.  Neck: Normal range of motion. Neck supple.  Cardiovascular: Normal rate, regular rhythm, normal heart sounds and intact distal pulses.   Pulmonary/Chest: Effort normal and breath sounds normal. No respiratory distress. He has no wheezes. He has no rales. He exhibits no tenderness.  Abdominal: Soft. Bowel sounds are normal. He exhibits no distension and no mass. There is no tenderness. There is no rebound and no guarding.  Musculoskeletal: Normal range of motion. He exhibits tenderness. He exhibits no edema.  Left thoracic paraspinal muscles tender to palpation. No midline tenderness, step off, or deformity.  Neurological: He is alert and oriented to person, place, and time. He has normal strength. No cranial nerve deficit or sensory deficit.  Skin: Skin is warm and dry. No rash noted. He is not diaphoretic. No erythema. No pallor.  Psychiatric: He has a normal mood and affect. His behavior is normal.  Nursing note and vitals reviewed.   ED Course  Procedures (including critical care time)  Labs Review Labs Reviewed  BASIC METABOLIC PANEL - Abnormal; Notable for the following:    Potassium 3.4 (*)    Glucose, Bld 100 (*)    All other components within normal limits  VALPROIC ACID LEVEL - Abnormal; Notable for the following:    Valproic Acid Lvl <10 (*)    All other components within normal limits  CBC  CBG MONITORING, ED    Imaging Review No results found.     EKG  Interpretation None      MDM   Final diagnoses:  Seizure-like activity   34 year old male with PMH of bipolar disorder, mild cognitive impairment, and seizures who presents with seizures. States his seizures started after his mom passed away in September 26, 2004. He reports recently, his seizures have been occuring weekly, and reports 2 seizures today. He does not appear post-ictal. No evidence of tongue biting. Denies urinary incontinence. Denies chest pain, shortness of breath.   Reports headache distributed over his forehead, which he reports is like his typical headache. Headache treated with ibuprofen in the ED. Patient is afebrile with no focal neuro deficits, nuchal rigidity, or change in vision. Doubt SAH, ICH, meningitis.   Patient with back pain. Mild tenderness to palpation of left thoracic paraspinal muscles. No midline tenderness, stepoff, or deformity. No neurological deficits and normal neuro exam. Patient can walk without pain. No loss of bowel or bladder control. No concern for  cauda equina. No fever, chills, weight loss, h/o cancer, IVDU. RICE protocol and ibuprofen indicated and discussed with patient.   CBC within normal limits. CMP remarkable for potassium of 3.4, repleted in the ED. Valproic acid level <10, and on review of his chart, it appears his valproic acid level has been sub-therapeutic for at least the past year.  Patient to be discharged home. He has no new symptoms, no neurological deficits, and normal neuro exam. EKG no evidence of acute ischemia. Vital signs are stable. No seizure-like activity observed while in the ED. Return precautions discussed. Patient to follow-up with neurology for further evaluation and management of seizures.   BP 108/68 mmHg  Pulse 49  Temp(Src) 98.7 F (37.1 C) (Oral)  Resp 15  Ht 6' (1.829 m)  Wt 145 lb (65.772 kg)  BMI 19.66 kg/m2  SpO2 97%    Marella Chimes, PA-C 01/10/15 1554  Quintella Reichert, MD 01/10/15 1605

## 2015-01-10 NOTE — ED Notes (Signed)
States has not taken depakote in "awhile". Had a seizure 2 days ago also.

## 2015-01-10 NOTE — Telephone Encounter (Signed)
I don't think he will be able to have MRI. This was scheduled at [facility] in the open scanner & he also had an Rx for Valium.

## 2015-01-10 NOTE — ED Notes (Signed)
Patient states he had 2 seizures today witnessed by his girlfriend.  He said the first one lasted 45 minutes and his girlfriend told him he was staring off into space and started shaking all over and was confused when he woke up.  He states he then had another one shortly thereafter that lasted 20 minutes and looked the same.  He then walked to the ED from [address] which is a couple of miles from this hospital.

## 2015-01-10 NOTE — Discharge Instructions (Signed)
1. Medications: usual home medications 2. Treatment: rest, drink plenty of fluids  3. Follow Up: please followup with your neurologist for discussion of your diagnoses and further evaluation after today's visit; please return to the ER for fever, chills, severe headache, visual changes, new or worsening symptoms   Seizure, Adult A seizure is abnormal electrical activity in the brain. Seizures usually last from 30 seconds to 2 minutes. There are various types of seizures. Before a seizure, you may have a warning sensation (aura) that a seizure is about to occur. An aura may include the following symptoms:   Fear or anxiety.  Nausea.  Feeling like the room is spinning (vertigo).  Vision changes, such as seeing flashing lights or spots. Common symptoms during a seizure include:  A change in attention or behavior (altered mental status).  Convulsions with rhythmic jerking movements.  Drooling.  Rapid eye movements.  Grunting.  Loss of bladder and bowel control.  Bitter taste in the mouth.  Tongue biting. After a seizure, you may feel confused and sleepy. You may also have an injury resulting from convulsions during the seizure. HOME CARE INSTRUCTIONS   If you are given medicines, take them exactly as prescribed by your health care provider.  Keep all follow-up appointments as directed by your health care provider.  Do not swim or drive or engage in risky activity during which a seizure could cause further injury to you or others until your health care provider says it is OK.  Get adequate rest.  Teach friends and family what to do if you have a seizure. They should:  Lay you on the ground to prevent a fall.  Put a cushion under your head.  Loosen any tight clothing around your neck.  Turn you on your side. If vomiting occurs, this helps keep your airway clear.  Stay with you until you recover.  Know whether or not you need emergency care. SEEK IMMEDIATE MEDICAL  CARE IF:  The seizure lasts longer than 5 minutes.  The seizure is severe or you do not wake up immediately after the seizure.  You have an altered mental status after the seizure.  You are having more frequent or worsening seizures. Someone should drive you to the emergency department or call local emergency services (911 in U.S.). MAKE SURE YOU:  Understand these instructions.  Will watch your condition.  Will get help right away if you are not doing well or get worse. Document Released: 05/14/2000 Document Revised: 03/07/2013 Document Reviewed: 12/27/2012 [facility] Patient Information 2015 [place], [place]. This information is not intended to replace advice given to you by your health care provider. Make sure you discuss any questions you have with your health care provider.   Emergency Department Resource Guide 1) Find a Doctor and Pay Out of Pocket Although you won't have to find out who is covered by your insurance plan, it is a good idea to ask around and get recommendations. You will then need to call the office and see if the doctor you have chosen will accept you as a new patient and what types of options they offer for patients who are self-pay. Some doctors offer discounts or will set up payment plans for their patients who do not have insurance, but you will need to ask so you aren't surprised when you get to your appointment.  2) Contact Your Local Health Department Not all health departments have doctors that can see patients for sick visits, but many do, so it  is worth a call to see if yours does. If you don't know where your local health department is, you can check in your phone book. The CDC also has a tool to help you locate your state's health department, and many state websites also have listings of all of their local health departments.  3) Find a [facility] If your illness is not likely to be very severe or complicated, you may want to try a walk in clinic.  These are popping up all over the country in pharmacies, drugstores, and shopping centers. They're usually staffed by nurse practitioners or physician assistants that have been trained to treat common illnesses and complaints. They're usually fairly quick and inexpensive. However, if you have serious medical issues or chronic medical problems, these are probably not your best option.  No Primary Care Doctor: - Call Health Connect at  [phone] - they can help you locate a primary care doctor that  accepts your insurance, provides certain services, etc. - Physician Referral Service- [phone]  Chronic Pain Problems: Organization         Address  Phone   Notes  [facility]  [phone] Patients need to be referred by their primary care doctor.   Medication Assistance: Organization         Address  Phone   Notes  [facility] Medication [facility] [address] [place], [place] [address] [phone] --Must be a resident of [facility] -- Must have NO insurance coverage whatsoever (no Medicaid/ Medicare, etc.) -- The pt. MUST have a primary care doctor that directs their care regularly and follows them in the community   MedAssist  [phone]   [employer]  [phone]    Agencies that provide inexpensive medical care: Organization         Address  Phone   Notes  [place]  [phone]   [facility]    [phone]   [facility] [place],  [address] [phone]   [place] [address], [place] [phone]   Planned Parenthood    [phone]   [facility]    [phone]   [place] and Ali Mol[address], [place] Phone:  [phone], Fax:  [phone] Hours of Operation:  9 am - 6 pm, M-F.  Also accepts Medicaid/Medicare and self-pay.  [facility] for  [place] [place], [address], [place] Phone: [phone], Fax: [phone]. Hours of Operation:  8:30 am - 5:30 pm, M-F.  Also accepts Medicaid and self-pay.  [facility] [address] [place] Phone: [phone]   [place], [place], [place] [phone], Ext. [phone] Mondays & Thursdays: 7-9 AM.  First 15 patients are seen on a first come, first serve basis.    [place] Providers:  Organization         Address  Phone   Notes  [facility] [address], [place] [phone] Also accepts self-pay patients.  [place], [place]  [phone]   [place], [address], [place] [phone]   [place] [address], [place] [phone]   Lucianne Lei [address]  [address], [place]   [phone] Only accepts [place] patients after they have their name applied to their card.   Self-Pay (no insurance) in [facility]:  Organization         Address  Phone   Notes  Sickle Cell Patients, [facility] [place] [phone]   [facility] [place] [phone]   [facility] [place]  [place], [place], [place] [phone]   [facility]/Dr. Osei-Bonsu  [address] [place] or [address], [place] [phone] Phone number for both [place] and [place] locations is the same.  Urgent Medical and [facility] [address] [place] [phone]   [facility] [address] [place] or [address] [phone]   [facility] [address], [place] [phone], phone; [phone], fax Sees patients 1st  and 3rd Saturday of every month.  Must not qualify for public or private insurance (i.e. Medicaid, Medicare, [place] Health Choice, Veterans' Benefits)  Household income should be no more than 200% of the poverty level The clinic cannot treat you if you are pregnant or think you are pregnant  Sexually transmitted diseases are not treated at the clinic.    Dental Care: Organization         Address  Phone  Notes  [facility] Department of [facility] [place] [phone] Accepts children up to age 62 who are enrolled in [place] or [place]; pregnant women with a Medicaid card; and children who have applied for Medicaid or [place] Health Choice, but were declined, whose parents can pay a reduced fee at time of service.  [facility] Department of [facility]  [address], [place] [phone] Accepts children up to age 70 who are enrolled in [place] or [place]; pregnant women with a Medicaid card; and children who have applied for Medicaid or [place] Health Choice, but were declined, whose parents can pay a reduced fee at time of service.  [facility] Adult [facility] Access PROGRAM  [place] [phone] Patients are seen by appointment only. Walk-ins are not accepted. [place] will see patients 82 years of age and older. Monday - Tuesday (8am-5pm) Most Wednesdays (8:30-5pm) $30 per visit, cash only  [facility] Adult Dental Access PROGRAM  [address], [facility] [phone] Patients are seen by appointment only. Walk-ins are not accepted. [place] will see patients 67 years of age and older. One Wednesday Evening (Monthly: Volunteer Based).  $30 per visit, cash only  [place]  [phone] for adults; Children under age 62, call Graduate Pediatric Dentistry at [phone]. Children aged 16-14, please call [phone] to request a pediatric  application.  Dental services are provided in all areas of dental care including fillings, crowns and bridges, complete and partial dentures, implants, gum treatment, root canals, and extractions. Preventive care is also provided. Treatment is provided to both adults and children. Patients are selected via a lottery and there is often a waiting list.   [facility] [address], [place]  [phone] [email or web address]   [facility] [address] [place], [place] [phone], Ext. [phone] Second and Fourth Thursday  of each month, opens at 6:30 AM; Clinic ends at 9 AM.  Patients are seen on a first-come first-served basis, and a limited number are seen during each clinic.   [facility]  [address] Hillard Danker [place], [place] [phone]   Eligibility Requirements You must have lived in [place], [place], or [place] counties for at least the last three months.   You cannot be eligible for state or federal sponsored [employer], including [employer], [place], or [employer].   You generally cannot be eligible for healthcare insurance through your employer.    How to apply: Eligibility screenings are held every Tuesday and Wednesday afternoon from 1:00 pm until 4:00 pm. You do not need an appointment for the interview!  [facility] [address], [place], [place]   [place]  Christmas Department  [place]  [phone]    Behavioral Health Resources in the Community: Intensive Outpatient Programs Organization         Address  Phone  Notes  [facility] [place]. [address], [place], [place] [phone]   [facility] Outpatient [address], [place], [place]   ADS: Alcohol & Drug Svcs [address], [place], [place]   [place]  [address],  [place], [place] or [phone]   Substance Abuse Resources Organization         Address  Phone  Notes  Alcohol and Drug Services  [phone]   [place]  [phone]   [facility] [place]   Chinita Pester  [phone]   Residential & Outpatient Substance Abuse Program  [phone]   Psychological Services Organization         Address  Phone  Notes  [facility]) [place]  [place]  [phone]   [place] [address] [place] or [phone]    Mobile Crisis Teams Organization         Address  Phone  Notes  [facility], Mobile Crisis Care Unit  [phone]   Assertive Psychotherapeutic Services  [address] [place], [place]   [place] [address], [place] [phone]    Self-Help/Support Groups Organization         Address  Phone             Notes  [place]. of [place] - variety of support groups  Talent Call for more information  Narcotics Anonymous (NA), [facility] [address], [employer] [place]  2 meetings at this location   Special educational needs teacher         Address  Phone  Notes  [facility] Residential Treatment [place],    [place]  1-([phone]   [facility]  [address], [place] [id number], [place], [place]   [place], [place] [phone] Admissions: 8am-3pm M-F  Incentives Substance [place] [address],    [place], [place] [phone]   [facility] [address] Jadene Pierini [place], [place]   The [place].,  [place], [place] - Intensive Outpatient [address] Krist[address], [place], [place]   [facility] ([place],  [place], [place] [phone] or [phone]   [facility] Services ([facility]) [address], [place], [place] Accepts Medicaid  [facility] [place] [address],  [place] [phone] Substance Abuse/Addiction Treatment   [facility] Organization         Address  Phone  Notes  [facility]  [phone]   Domenic Schwab, PhD [address] Arlis Porta [place], [place]   [phone] or [phone]   [place] [address] [place], [place] [phone]   [place] [address], [place], [place] [phone] Insurance/Medicaid/sponsorship through [facility] and Families [address]                                    [place], [place] [phone] [place] [address], [place] [phone]    Dr. Adele Schilder  [phone]   [facility] Dept. 1) [address] [place] 2) [place] [place] 3)  [address], [place] [phone]  [phone]   [place] [phone] or [phone] (After Hours)

## 2015-01-10 NOTE — Telephone Encounter (Signed)
Molly from [facility] called this patient was unable to be scanned for his MRI this morning he is very, very,very claustrophobic

## 2015-01-10 NOTE — ED Notes (Addendum)
Pt has hx of seizures, states had a seizure today -- witnessed by girlfriend -- pt walked here from home. Does not appear post ictal.

## 2015-01-11 ENCOUNTER — Emergency Department (HOSPITAL_COMMUNITY)
Admission: EM | Admit: 2015-01-11 | Discharge: 2015-01-11 | Disposition: A | Discharge status: Home / Self Care | Payer: Medicaid Other | Attending: Emergency Medicine | Admitting: Emergency Medicine

## 2015-01-11 ENCOUNTER — Encounter (HOSPITAL_COMMUNITY): Payer: Self-pay | Admitting: Oncology

## 2015-01-11 ENCOUNTER — Encounter (HOSPITAL_COMMUNITY): Payer: Self-pay | Admitting: Emergency Medicine

## 2015-01-11 DIAGNOSIS — F329 Major depressive disorder, single episode, unspecified: Secondary | ICD-10-CM | POA: Insufficient documentation

## 2015-01-11 DIAGNOSIS — G40909 Epilepsy, unspecified, not intractable, without status epilepticus: Secondary | ICD-10-CM | POA: Diagnosis not present

## 2015-01-11 DIAGNOSIS — Z7951 Long term (current) use of inhaled steroids: Secondary | ICD-10-CM | POA: Diagnosis not present

## 2015-01-11 DIAGNOSIS — F419 Anxiety disorder, unspecified: Secondary | ICD-10-CM | POA: Diagnosis not present

## 2015-01-11 DIAGNOSIS — F3175 Bipolar disorder, in partial remission, most recent episode depressed: Secondary | ICD-10-CM | POA: Insufficient documentation

## 2015-01-11 DIAGNOSIS — Z046 Encounter for general psychiatric examination, requested by authority: Secondary | ICD-10-CM | POA: Diagnosis present

## 2015-01-11 DIAGNOSIS — F3130 Bipolar disorder, current episode depressed, mild or moderate severity, unspecified: Secondary | ICD-10-CM

## 2015-01-11 DIAGNOSIS — Z79899 Other long term (current) drug therapy: Secondary | ICD-10-CM | POA: Insufficient documentation

## 2015-01-11 DIAGNOSIS — R569 Unspecified convulsions: Secondary | ICD-10-CM | POA: Diagnosis present

## 2015-01-11 DIAGNOSIS — Z880 Allergy status to penicillin: Secondary | ICD-10-CM | POA: Insufficient documentation

## 2015-01-11 LAB — CBG MONITORING, ED: Glucose-Capillary: 73 mg/dL (ref 65–99)

## 2015-01-11 LAB — VALPROIC ACID LEVEL: Valproic Acid Lvl: <10 ug/mL — ABNORMAL LOW (ref 50.0–100.0)

## 2015-01-11 MED ORDER — DIVALPROEX SODIUM ER 250 MG PO TB24
250.0000 mg | ORAL_TABLET | Freq: Every day | ORAL | Status: DC
  Administered 2015-01-11: 250 mg via ORAL
  Filled 2015-01-11: qty 1

## 2015-01-11 NOTE — ED Notes (Signed)
Per pt his girlfriend called the cops because he was trying to cut himself.  Pt admits that he wants to "cut" himself however will not explain his intent behind wanting to cut himself.  Pt denies SI/HI.

## 2015-01-11 NOTE — BH Assessment (Signed)
Spoke to Florene Glen, NP who said patient presents with conflict with girlfriend and threats with cutting self. Tele- Assessment will be initiated. Fontaine Hehl K. Shiryl Ruddy 01/11/2015 10:44 PM

## 2015-01-11 NOTE — ED Provider Notes (Signed)
CSN: [id number]     Arrival date & time 01/11/15  1506 History   First MD Initiated Contact with Patient 01/11/15 1559     Chief Complaint  Patient presents with  . Seizures    HPI  Patient presents after a possible seizure. Patient notes that he was walking to the store, loss consciousness, awoke with paramedics nearby. He states that this is not unusual for him, typical for seizures. Prior to the event he was in his usual state of health, states that he takes all medication as directed, including Depakote. Currently the patient claims of dizziness, headache, but denies asymmetric weakness, dysesthesia anywhere, focal pain beyond headache.   Past Medical History  Diagnosis Date  . Depression   . Seizures    History reviewed. No pertinent past surgical history. Family History  Problem Relation Age of Onset  . Heart disease Mother    Social History  Substance Use Topics  . Smoking status: Never Smoker   . Smokeless tobacco: Never Used  . Alcohol Use: No    Review of Systems  Constitutional:       Per HPI, otherwise negative  HENT:       Per HPI, otherwise negative  Respiratory:       Per HPI, otherwise negative  Cardiovascular:       Per HPI, otherwise negative  Gastrointestinal: Positive for nausea. Negative for vomiting.  Endocrine:       Negative aside from HPI  Genitourinary:       Neg aside from HPI   Musculoskeletal:       Per HPI, otherwise negative  Skin: Negative.   Neurological: Positive for seizures and headaches. Negative for syncope and weakness.      Allergies  Penicillins and Codeine  Home Medications   Prior to Admission medications   Medication Sig Start Date End Date Taking? Authorizing Provider  albuterol (PROVENTIL HFA;VENTOLIN HFA) 108 (90 BASE) MCG/ACT inhaler Inhale 2 puffs into the lungs every 6 (six) hours as needed for wheezing or shortness of breath (asthma symptoms).    Historical Provider, MD  diazepam (VALIUM) 5 MG tablet  Take 1 tablet 30 minutes before MRI scan, may repeat with 2nd tablet if needed. 12/26/14   Cameron Sprang, MD  divalproex (DEPAKOTE ER) 250 MG 24 hr tablet Take 1 tablet (250 mg total) by mouth at bedtime. 11/27/14   Cameron Sprang, MD  fluticasone (FLONASE) 50 MCG/ACT nasal spray Place 2 sprays into both nostrils daily as needed for allergies (runny nose).  11/21/14   Historical Provider, MD  ibuprofen (ADVIL,MOTRIN) 400 MG tablet Take 1 tablet (400 mg total) by mouth every 6 (six) hours as needed. 01/10/15   Marella Chimes, PA-C  LamoTRIgine 100 MG TB24 Take 2 tablets daily 01/08/15   Cameron Sprang, MD  oxyCODONE-acetaminophen (PERCOCET) 7.5-325 MG per tablet Take one tablet PO every 4 to 6 hours as needed for pain Patient not taking: Reported on 01/08/2015 11/19/14   Ashley Murrain, NP   BP 124/67 mmHg  Pulse 53  Temp(Src) 98.5 F (36.9 C) (Oral)  Resp 16  Ht 6' (1.829 m)  Wt 145 lb (65.772 kg)  BMI 19.66 kg/m2  SpO2 95% Physical Exam  Constitutional: He is oriented to person, place, and time. He appears well-developed. No distress.  HENT:  Head: Normocephalic and atraumatic.  Eyes: Conjunctivae and EOM are normal.  Cardiovascular: Normal rate, regular rhythm and intact distal pulses.   Pulmonary/Chest: Effort normal.  No stridor. No respiratory distress.  Abdominal: He exhibits no distension.  Musculoskeletal: He exhibits no edema.  Neurological: He is alert and oriented to person, place, and time. No cranial nerve deficit. He exhibits normal muscle tone. Coordination normal.  Skin: Skin is warm and dry.  Psychiatric: He has a normal mood and affect.  Nursing note and vitals reviewed.   ED Course  Procedures (including critical care time) Labs Review Labs Reviewed  VALPROIC ACID LEVEL - Abnormal; Notable for the following:    Valproic Acid Lvl <10 (*)    All other components within normal limits  CBG MONITORING, ED   Notably, the patient has sub therapeutic Depakote level  today, as he has had on multiple recent visits. Imaging Review No results found. I, Ambriella Kitt, personally reviewed and evaluated these images and lab results as part of my medical decision-making.  Chart review demonstrates 19 visits in 6 months, typically for seizures.   MDM  Patient presents after typical seizure-like episode. Here patient has nauseous, has headache, dizziness, but no evidence for neurologic consultation, compromise. Patient was seen medically stable, and after receiving fluids, Depakote, discharged in stable condition.  Carmin Muskrat, MD 01/11/15 [phone]

## 2015-01-11 NOTE — Discharge Instructions (Signed)
Bipolar Disorder Bipolar disorder is a mental illness. The term bipolar disorder actually is used to describe a group of disorders that all share varying degrees of emotional highs and lows that can interfere with daily functioning, such as work, school, or relationships. Bipolar disorder also can lead to drug abuse, hospitalization, and suicide. The emotional highs of bipolar disorder are periods of elation or irritability and high energy. These highs can range from a mild form (hypomania) to a severe form (mania). People experiencing episodes of hypomania may appear energetic, excitable, and highly productive. People experiencing mania may behave impulsively or erratically. They often make poor decisions. They may have difficulty sleeping. The most severe episodes of mania can involve having very distorted beliefs or perceptions about the world and seeing or hearing things that are not real (psychotic delusions and hallucinations).  The emotional lows of bipolar disorder (depression) also can range from mild to severe. Severe episodes of bipolar depression can involve psychotic delusions and hallucinations. Sometimes people with bipolar disorder experience a state of mixed mood. Symptoms of hypomania or mania and depression are both present during this mixed-mood episode. SIGNS AND SYMPTOMS There are signs and symptoms of the episodes of hypomania and mania as well as the episodes of depression. The signs and symptoms of hypomania and mania are similar but vary in severity. They include:  Inflated self-esteem or feeling of increased self-confidence.  Decreased need for sleep.  Unusual talkativeness (rapid or pressured speech) or the feeling of a need to keep talking.  Sensation of racing thoughts or constant talking, with quick shifts between topics that may or may not be related (flight of ideas).  Decreased ability to focus or concentrate.  Increased purposeful activity, such as work, studies,  or social activity, or nonproductive activity, such as pacing, squirming and fidgeting, or finger and toe tapping.  Impulsive behavior and use of poor judgment, resulting in high-risk activities, such as having unprotected sex or spending excessive amounts of money. Signs and symptoms of depression include the following:   Feelings of sadness, hopelessness, or helplessness.  Frequent or uncontrollable episodes of crying.  Lack of feeling anything or caring about anything.  Difficulty sleeping or sleeping too much.  Inability to enjoy the things you used to enjoy.   Desire to be alone all the time.   Feelings of guilt or worthlessness.  Lack of energy or motivation.   Difficulty concentrating, remembering, or making decisions.  Change in appetite or weight beyond normal fluctuations.  Thoughts of death or the desire to harm yourself. DIAGNOSIS  Bipolar disorder is diagnosed through an assessment by your caregiver. Your caregiver will ask questions about your emotional episodes. There are two main types of bipolar disorder. People with type I bipolar disorder have manic episodes with or without depressive episodes. People with type II bipolar disorder have hypomanic episodes and major depressive episodes, which are more serious than mild depression. The type of bipolar disorder you have can make an important difference in how your illness is monitored and treated. Your caregiver may ask questions about your medical history and use of alcohol or drugs, including prescription medication. Certain medical conditions and substances also can cause emotional highs and lows that resemble bipolar disorder (secondary bipolar disorder).  TREATMENT  Bipolar disorder is a long-term illness. It is best controlled with continuous treatment rather than treatment only when symptoms occur. The following treatments can be prescribed for bipolar disorders:  Medication--Medication can be prescribed by  a doctor that  is an expert in treating mental disorders (psychiatrists). Medications called mood stabilizers are usually prescribed to help control the illness. Other medications are sometimes added if symptoms of mania, depression, or psychotic delusions and hallucinations occur despite the use of a mood stabilizer.  Talk therapy--Some forms of talk therapy are helpful in providing support, education, and guidance. A combination of medication and talk therapy is best for managing the disorder over time. A procedure in which electricity is applied to your brain through your scalp (electroconvulsive therapy) is used in cases of severe mania when medication and talk therapy do not work or work too slowly. Document Released: 08/23/2000 Document Revised: 09/11/2012 Document Reviewed: 06/12/2012 [facility] Patient Information 2015 [place], [place]. This information is not intended to replace advice given to you by your health care provider. Make sure you discuss any questions you have with your health care provider.  You have been assessed by our treatment specialist it's safe for you [place] home and follow-up with your therapist  If anything changes between now in your therapy appointment, please return for further evaluation
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
## 2015-01-11 NOTE — ED Notes (Signed)
Walking to the store and patient states passed out unsure for how long and woke up on the sidewalk. Patient states he thinks he had a seizure. Alert answering and following commands appropriate. EMS reported CBG 71 gave food and repeat cbg 96.

## 2015-01-11 NOTE — ED Notes (Signed)
Pt will not allow me to draw blood...states they did that already at [facility].  Notified nurse

## 2015-01-11 NOTE — Discharge Instructions (Signed)
As discussed, your evaluation today has been largely reassuring.  But, it is important that you monitor your condition carefully, and do not hesitate to return to the ED if you develop new, or concerning changes in your condition.  It is very important that she take all medication as directed, particularly your antiseizure medication.  Otherwise, please follow-up with your physician for appropriate ongoing care.

## 2015-01-11 NOTE — ED Notes (Signed)
Pt stable, ambulatory, states understanding of discharge instructions 

## 2015-01-11 NOTE — ED Provider Notes (Signed)
CSN: [id number]     Arrival date & time 01/11/15  2059 History  This chart was scribed for non-physician practitioner Junius Creamer, NP, working with Daleen Bo, MD, by Eustaquio Maize, ED Scribe. This patient was seen in room WTR4/WLPT4 and the patient's care was started at 10:25 PM.  Chief Complaint  Patient presents with  . Medical Clearance   The history is provided by the patient. No language interpreter was used.     HPI Comments: Franklin [name] is a 34 y.o. male with hx depression and seizures who presents to the Emergency Department for medical clearance. Pt mentions that he was at [facility] ED earlier today after having a seizure. When he was discharged, he called his girlfriend to pick him up and she hung up on him, angering pt. Pt then threatened to cut himself, prompting girlfriend to call the cops. Pt states that he has never cut himself in the past and denies SI and HI currently. Denies any other symptoms.   Past Medical History  Diagnosis Date  . Depression   . Seizures    History reviewed. No pertinent past surgical history. Family History  Problem Relation Age of Onset  . Heart disease Mother    Social History  Substance Use Topics  . Smoking status: Never Smoker   . Smokeless tobacco: Never Used  . Alcohol Use: No    Review of Systems  Skin: Negative for wound.  Psychiatric/Behavioral: Positive for agitation. Negative for suicidal ideas and self-injury. The patient is nervous/anxious.   All other systems reviewed and are negative.     Allergies  Penicillins and Codeine  Home Medications   Prior to Admission medications   Medication Sig Start Date End Date Taking? Authorizing Provider  albuterol (PROVENTIL HFA;VENTOLIN HFA) 108 (90 BASE) MCG/ACT inhaler Inhale 2 puffs into the lungs every 6 (six) hours as needed for wheezing or shortness of breath (asthma symptoms).   Yes Historical Provider, MD  divalproex (DEPAKOTE ER) 250 MG 24 hr tablet Take 1 tablet  (250 mg total) by mouth at bedtime. 11/27/14  Yes Cameron Sprang, MD  ibuprofen (ADVIL,MOTRIN) 400 MG tablet Take 1 tablet (400 mg total) by mouth every 6 (six) hours as needed. Patient taking differently: Take 400 mg by mouth every 6 (six) hours as needed for moderate pain.  01/10/15  Yes Marella Chimes, PA-C  LamoTRIgine 100 MG TB24 Take 2 tablets daily 01/08/15  Yes Cameron Sprang, MD  diazepam (VALIUM) 5 MG tablet Take 1 tablet 30 minutes before MRI scan, may repeat with 2nd tablet if needed. Patient not taking: Reported on 01/11/2015 12/26/14   Cameron Sprang, MD  fluticasone [facility]) 50 MCG/ACT nasal spray Place 2 sprays into both nostrils daily as needed for allergies (runny nose).  11/21/14   Historical Provider, MD  oxyCODONE-acetaminophen (PERCOCET) 7.5-325 MG per tablet Take one tablet PO every 4 to 6 hours as needed for pain Patient not taking: Reported on 01/08/2015 11/19/14   Ashley Murrain, NP   Triage Vitals: BP 134/81 mmHg  Pulse 85  Temp(Src) 98.3 F (36.8 C) (Oral)  Resp 20  Ht 6\' 2"  (1.88 m)  Wt 145 lb (65.772 kg)  BMI 18.61 kg/m2  SpO2 99%   Physical Exam  Constitutional: He is oriented to person, place, and time. He appears well-nourished.  HENT:  Head: Normocephalic.  Eyes: Pupils are equal, round, and reactive to light.  Cardiovascular: Normal rate.   Musculoskeletal: Normal range of motion.  Neurological: He is alert and oriented to person, place, and time.  Skin: Skin is warm.  Psychiatric: His speech is normal and behavior is normal. Thought content normal. His mood appears anxious. He expresses inappropriate judgment. He expresses no suicidal ideation. He expresses no suicidal plans.  Nursing note and vitals reviewed.   ED Course  Procedures (including critical care time)  DIAGNOSTIC STUDIES: Oxygen Saturation is 99% on RA, normal by my interpretation.    COORDINATION OF CARE: 10:30 PM-Discussed treatment plan which includes consult with TTS with pt at  bedside and pt agreed to plan.   Labs Review Labs Reviewed  COMPREHENSIVE METABOLIC PANEL  ETHANOL  SALICYLATE LEVEL  ACETAMINOPHEN LEVEL  CBC  URINE RAPID DRUG SCREEN, HOSP PERFORMED    Imaging Review No results found.    EKG Interpretation None     It appears as this is in idle threat to get his girlfriend's attention, but I will have to see [place] evaluate him as he does have a psychiatric history.  He has never cut himself in the pastnor attempted suicide Patient has been assessed by TTSthey feel he is safe to be discharged home.  Follow-up with his therapist MDM   Final diagnoses:  Bipolar disorder, most recent episode depressed, remission status unspecified    I personally performed the services described in this documentation, which was scribed in my presence. The recorded information has been reviewed and is accurate.    Junius Creamer, NP 01/11/15 [place], NP 01/11/15 [id number]  Daleen Bo, MD 01/11/15 [phone]

## 2015-01-20 ENCOUNTER — Encounter: Payer: Self-pay | Admitting: Internal Medicine

## 2015-01-20 ENCOUNTER — Ambulatory Visit: Payer: Medicaid Other | Attending: Internal Medicine | Admitting: Internal Medicine

## 2015-01-20 VITALS — BP 131/76 | HR 71 | Temp 98.8°F | Resp 16 | Ht 74.0 in | Wt 157.0 lb

## 2015-01-20 DIAGNOSIS — G40309 Generalized idiopathic epilepsy and epileptic syndromes, not intractable, without status epilepticus: Secondary | ICD-10-CM | POA: Diagnosis present

## 2015-01-20 NOTE — Progress Notes (Signed)
Patient ID: Franklin [name], male   DOB: 08/17/1980, 34 y.o.   MRN: [id number]  CC: seizure  HPI:  Franklin [name] is a 34 y.o. male here today for a follow up visit.  Patient has past medical history of seizure disorder, bipolar disorder, mild cognitive impairment, and depression. He reports that 2 weeks ago he threatened to cut himself with scissors so she called the police and put him out. He is now living in a rooming house until he figures out where to go. He reports that he has not seen a psychiatrist in "a long time". Review of his charts reveals that he has been to the ER 20 times for seizure like activity that has not been observed by ER staff. Patient reports that he has been stressed and depressed but does not know where to go to get help. He last seen his Neurologist 12 days ago and was given refills but did not know it was given. He states that he has been out of his Depakote for one month. He states that he forgets to take his pills often. He believes he has had 3 seizures recently but is unable to give a exact date.   Allergies  Allergen Reactions  . Penicillins Nausea And Vomiting  . Codeine Nausea And Vomiting   Past Medical History  Diagnosis Date  . Depression   . Seizures    Current Outpatient Prescriptions on File Prior to Visit  Medication Sig Dispense Refill  . divalproex (DEPAKOTE ER) 250 MG 24 hr tablet Take 1 tablet (250 mg total) by mouth at bedtime. 30 tablet 6  . LamoTRIgine 100 MG TB24 Take 2 tablets daily 60 tablet 6  . albuterol (PROVENTIL HFA;VENTOLIN HFA) 108 (90 BASE) MCG/ACT inhaler Inhale 2 puffs into the lungs every 6 (six) hours as needed for wheezing or shortness of breath (asthma symptoms).    . diazepam (VALIUM) 5 MG tablet Take 1 tablet 30 minutes before MRI scan, may repeat with 2nd tablet if needed. (Patient not taking: Reported on 01/11/2015) 2 tablet 0  . fluticasone (FLONASE) 50 MCG/ACT nasal spray Place 2 sprays into both nostrils daily as needed for  allergies (runny nose).   1  . ibuprofen (ADVIL,MOTRIN) 400 MG tablet Take 1 tablet (400 mg total) by mouth every 6 (six) hours as needed. (Patient taking differently: Take 400 mg by mouth every 6 (six) hours as needed for moderate pain. ) 30 tablet 0  . oxyCODONE-acetaminophen (PERCOCET) 7.5-325 MG per tablet Take one tablet PO every 4 to 6 hours as needed for pain (Patient not taking: Reported on 01/08/2015) 20 tablet 0   No current facility-administered medications on file prior to visit.   Family History  Problem Relation Age of Onset  . Heart disease Mother    Social History   Social History  . Marital Status: Single    Spouse Name: N/A  . Number of Children: N/A  . Years of Education: N/A   Occupational History  . Not on file.   Social History Main Topics  . Smoking status: Never Smoker   . Smokeless tobacco: Never Used  . Alcohol Use: No  . Drug Use: No  . Sexual Activity: Not Currently   Other Topics Concern  . Not on file   Social History Narrative    Review of Systems: Other than what is stated in HPI, all other systems are negative.   Objective:   Filed Vitals:   01/20/15 1655  BP: 131/76  Pulse: 71  Temp: 98.8 F (37.1 C)  Resp: 16    Physical Exam  Constitutional: He is oriented to person, place, and time.  Cardiovascular: Normal rate, regular rhythm and normal heart sounds.   Pulmonary/Chest: Effort normal and breath sounds normal.  Neurological: He is alert and oriented to person, place, and time. No cranial nerve deficit. Coordination normal.    Lab Results  Component Value Date   WBC 4.9 01/10/2015   HGB 14.7 01/10/2015   HCT 41.6 01/10/2015   MCV 85.8 01/10/2015   PLT 178 01/10/2015   Lab Results  Component Value Date   CREATININE 1.10 01/10/2015   BUN 13 01/10/2015   NA 142 01/10/2015   K 3.4* 01/10/2015   CL 108 01/10/2015   CO2 26 01/10/2015    Lab Results  Component Value Date   HGBA1C 5.4 10/14/2014   Lipid Panel      Component Value Date/Time   CHOL 168 10/14/2014 1445   TRIG 53 10/14/2014 1445   HDL 44 10/14/2014 1445   CHOLHDL 3.8 10/14/2014 1445   VLDL 11 10/14/2014 1445   LDLCALC 113* 10/14/2014 1445       Assessment and plan:   Roxy was seen today for follow-up.  Diagnoses and all orders for this visit:  Generalized idiopathic epilepsy and epileptic syndromes, without status epilepticus, not intractable -    Refill divalproex (DEPAKOTE ER) 250 MG 24 hr tablet; Take 1 tablet (250 mg total) by mouth at bedtime. -    Refill LamoTRIgine 100 MG TB24; Take 2 tablets daily   Return if symptoms worsen or fail to improve.       Lance Bosch, [facility] [phone] 01/20/2015, 5:19 PM

## 2015-01-20 NOTE — Progress Notes (Signed)
Patient states he is here for follow up on his seizure disorder Patient also requesting refills on his depakote and lamotrigine

## 2015-01-21 ENCOUNTER — Encounter (HOSPITAL_BASED_OUTPATIENT_CLINIC_OR_DEPARTMENT_OTHER): Payer: Medicaid Other | Admitting: Clinical

## 2015-01-21 DIAGNOSIS — F419 Anxiety disorder, unspecified: Principal | ICD-10-CM

## 2015-01-21 DIAGNOSIS — F329 Major depressive disorder, single episode, unspecified: Secondary | ICD-10-CM

## 2015-01-21 DIAGNOSIS — F418 Other specified anxiety disorders: Secondary | ICD-10-CM

## 2015-01-21 NOTE — Progress Notes (Signed)
ASSESSMENT: Pt currently experiencing symptoms of anxiety and depression; further evaluation is recommended to rule out medical issues related to his seizures. Pt needs to f/u with PCP and [facility], as well as having further [facility] evaluation. Pt would benefit from psychoeducation and supportive counseling regarding coping with symptoms of anxiety and depression.  Stage of Change: precontemplative  PLAN: 1. F/U with behavioral health consultant in as needed 2. Psychiatric Medications: none 3. Behavioral recommendation(s):   -Go to walk-in [facility] [place]  -Consider reading educational material regarding coping with symptoms of anxiety and depression  SUBJECTIVE: Pt. referred by Junius Roads for psych referral Pt. reports the following symptoms/concerns: Pt states that he is currently living on his own, and that he has numerous seizures. He says that he feels depressed sometimes, but that he does not want to take his life; he feels anxious because he does not know when he is going to have a seizure. He says he refused to have an MRI, and is uncertain about his previous bipolar diagnosis. Duration of problem: seizures about 17 years; recent problems, less than one month Severity: moderate  OBJECTIVE: Orientation & Cognition: Oriented x3. Thought processes normal and appropriate to situation. Mood: appropriate. Affect: appropriate Appearance: appropriate Risk of harm to self or others: no risk of harm to self or others Substance use: none Assessments administered: PHQ9: 12/ GAD7: 12  Diagnosis: Anxiety and depression CPT Code: F41.8 -------------------------------------------- Other(s) present in the room: none  Time spent with patient in exam room: 20 minutes

## 2015-01-22 ENCOUNTER — Emergency Department (HOSPITAL_COMMUNITY)
Admission: EM | Admit: 2015-01-22 | Discharge: 2015-01-22 | Disposition: A | Discharge status: Home / Self Care | Payer: Medicaid Other | Attending: Emergency Medicine | Admitting: Emergency Medicine

## 2015-01-22 ENCOUNTER — Emergency Department (HOSPITAL_COMMUNITY)
Admission: EM | Admit: 2015-01-22 | Discharge: 2015-01-22 | Disposition: A | Discharge status: Home / Self Care | Payer: Medicaid Other

## 2015-01-22 ENCOUNTER — Encounter (HOSPITAL_COMMUNITY): Payer: Self-pay | Admitting: Emergency Medicine

## 2015-01-22 DIAGNOSIS — Z79899 Other long term (current) drug therapy: Secondary | ICD-10-CM | POA: Diagnosis not present

## 2015-01-22 DIAGNOSIS — Z880 Allergy status to penicillin: Secondary | ICD-10-CM | POA: Insufficient documentation

## 2015-01-22 DIAGNOSIS — F329 Major depressive disorder, single episode, unspecified: Secondary | ICD-10-CM | POA: Diagnosis not present

## 2015-01-22 DIAGNOSIS — G40909 Epilepsy, unspecified, not intractable, without status epilepticus: Secondary | ICD-10-CM | POA: Diagnosis not present

## 2015-01-22 DIAGNOSIS — R569 Unspecified convulsions: Secondary | ICD-10-CM

## 2015-01-22 LAB — BASIC METABOLIC PANEL
ANION GAP: 7 (ref 5–15)
BUN: 14 mg/dL (ref 6–20)
CALCIUM: 9.4 mg/dL (ref 8.9–10.3)
CHLORIDE: 107 mmol/L (ref 101–111)
CO2: 26 mmol/L (ref 22–32)
Creatinine, Ser: 0.96 mg/dL (ref 0.61–1.24)
GFR calc Af Amer: >60 mL/min (ref >60)
GFR calc non Af Amer: >60 mL/min (ref >60)
GLUCOSE: 89 mg/dL (ref 65–99)
POTASSIUM: 3.7 mmol/L (ref 3.5–5.1)
Sodium: 140 mmol/L (ref 135–145)

## 2015-01-22 LAB — CBC WITH DIFFERENTIAL/PLATELET
BASOS ABS: 0 10*3/uL (ref 0.0–0.1)
Basophils Relative: 0 % (ref 0–1)
Eosinophils Absolute: 0 10*3/uL (ref 0.0–0.7)
Eosinophils Relative: 0 % (ref 0–5)
HEMATOCRIT: 43.3 % (ref 39.0–52.0)
HEMOGLOBIN: 15.2 g/dL (ref 13.0–17.0)
LYMPHS PCT: 20 % (ref 12–46)
Lymphs Abs: 1.3 10*3/uL (ref 0.7–4.0)
MCH: 30.6 pg (ref 26.0–34.0)
MCHC: 35.1 g/dL (ref 30.0–36.0)
MCV: 87.1 fL (ref 78.0–100.0)
MONO ABS: 0.4 10*3/uL (ref 0.1–1.0)
Monocytes Relative: 6 % (ref 3–12)
NEUTROS ABS: 4.7 10*3/uL (ref 1.7–7.7)
NEUTROS PCT: 74 % (ref 43–77)
Platelets: 187 10*3/uL (ref 150–400)
RBC: 4.97 MIL/uL (ref 4.22–5.81)
RDW: 12.5 % (ref 11.5–15.5)
WBC: 6.4 10*3/uL (ref 4.0–10.5)

## 2015-01-22 LAB — VALPROIC ACID LEVEL: Valproic Acid Lvl: <10 ug/mL — ABNORMAL LOW (ref 50.0–100.0)

## 2015-01-22 MED ORDER — LAMOTRIGINE 100 MG PO TABS: 200.0000 mg | ORAL_TABLET | Freq: Every day | ORAL | Status: DC

## 2015-01-22 MED ORDER — DIVALPROEX SODIUM 250 MG PO DR TAB: 250.0000 mg | DELAYED_RELEASE_TABLET | Freq: Every day | ORAL | Status: DC

## 2015-01-22 MED ORDER — VALPROATE SODIUM 500 MG/5ML IV SOLN
500.0000 mg | Freq: Once | INTRAVENOUS | Status: AC
  Administered 2015-01-22: 500 mg via INTRAVENOUS
  Filled 2015-01-22: qty 5

## 2015-01-22 MED ORDER — LORAZEPAM 2 MG/ML IJ SOLN
1.0000 mg | Freq: Once | INTRAMUSCULAR | Status: AC
  Administered 2015-01-22: 1 mg via INTRAVENOUS
  Filled 2015-01-22: qty 1

## 2015-01-22 NOTE — ED Notes (Signed)
Patient called for triage x3. No answer

## 2015-01-22 NOTE — ED Provider Notes (Signed)
CSN: [id number]     Arrival date & time 01/22/15  1448 History   First MD Initiated Contact with Patient 01/22/15 1455     Chief Complaint  Patient presents with  . Seizures  . V70.1      HPI  Pt presents after a seizure at his mothers house.  States that a friend drove him to [place] from [place] to see his mom.  Witnessed generalized seizure after.  EMS and [employer] summoned as pt agitated post ictal.  Made a statement about jumping from a bridge to family.  Per [employer] no SI statements to them.  Pt states that this has happened before (SI statement p Sz).  Chart review confirms.  Pt denies SI to me.  Is amnestic for events just a and p.Sz.  Is A and O here. States compliance with his medications, and can describe dosages to me.  No injury from sz, other than "my back hurts, and it usually does".     Past Medical History  Diagnosis Date  . Depression   . Seizures    History reviewed. No pertinent past surgical history. Family History  Problem Relation Age of Onset  . Heart disease Mother    Social History  Substance Use Topics  . Smoking status: Never Smoker   . Smokeless tobacco: Never Used  . Alcohol Use: No    Review of Systems  Constitutional: Negative for fever, chills, diaphoresis, appetite change and fatigue.  HENT: Negative for mouth sores, sore throat and trouble swallowing.   Eyes: Negative for visual disturbance.  Respiratory: Negative for cough, chest tightness, shortness of breath and wheezing.   Cardiovascular: Negative for chest pain.  Gastrointestinal: Negative for nausea, vomiting, abdominal pain, diarrhea and abdominal distention.  Endocrine: Negative for polydipsia, polyphagia and polyuria.  Genitourinary: Negative for dysuria, frequency and hematuria.  Musculoskeletal: Negative for gait problem.  Skin: Negative for color change, pallor and rash.  Neurological: Positive for seizures. Negative for dizziness, syncope, light-headedness and headaches.   Hematological: Does not bruise/bleed easily.  Psychiatric/Behavioral: Negative for behavioral problems and confusion.      Allergies  Penicillins and Codeine  Home Medications   Prior to Admission medications   Medication Sig Start Date End Date Taking? Authorizing Provider  albuterol (PROVENTIL HFA;VENTOLIN HFA) 108 (90 BASE) MCG/ACT inhaler Inhale 2 puffs into the lungs every 6 (six) hours as needed for wheezing or shortness of breath (asthma symptoms).   Yes Historical Provider, MD  ibuprofen (ADVIL,MOTRIN) 400 MG tablet Take 1 tablet (400 mg total) by mouth every 6 (six) hours as needed. Patient taking differently: Take 400 mg by mouth every 6 (six) hours as needed for moderate pain.  01/10/15  Yes Marella Chimes, PA-C  divalproex (DEPAKOTE) 250 MG DR tablet Take 1 tablet (250 mg total) by mouth daily. 01/22/15   Tanna Furry, MD  lamoTRIgine (LAMICTAL) 100 MG tablet Take 2 tablets (200 mg total) by mouth daily. 01/22/15   Tanna Furry, MD   BP 133/80 mmHg  Pulse 59  Temp(Src) 98.8 F (37.1 C) (Oral)  Resp 17  Wt 155 lb (70.308 kg)  SpO2 100% Physical Exam  Constitutional: He is oriented to person, place, and time. He appears well-developed and well-nourished. No distress.  HENT:  Head: Normocephalic.  Eyes: Conjunctivae are normal. Pupils are equal, round, and reactive to light. No scleral icterus.  Neck: Normal range of motion. Neck supple. No thyromegaly present.  Cardiovascular: Normal rate and regular rhythm.  Exam reveals  no gallop and no friction rub.   No murmur heard. Pulmonary/Chest: Effort normal and breath sounds normal. No respiratory distress. He has no wheezes. He has no rales.  Abdominal: Soft. Bowel sounds are normal. He exhibits no distension. There is no tenderness. There is no rebound.  Musculoskeletal: Normal range of motion.  Neurological: He is alert and oriented to person, place, and time.  Skin: Skin is warm and dry. No rash noted.  Psychiatric: He  has a normal mood and affect. His behavior is normal.  Calm, interactive, appropriate.    ED Course  Procedures (including critical care time) Labs Review Labs Reviewed  VALPROIC ACID LEVEL - Abnormal; Notable for the following:    Valproic Acid Lvl <10 (*)    All other components within normal limits  CBC WITH DIFFERENTIAL/PLATELET  BASIC METABOLIC PANEL    Imaging Review No results found. I have personally reviewed and evaluated these images and lab results as part of my medical decision-making.   EKG Interpretation   Date/Time:  Wednesday January 22 2015 14:53:12 EDT Ventricular Rate:  70 PR Interval:  167 QRS Duration: 121 QT Interval:  384 QTC Calculation: 414 R Axis:   -8 Text Interpretation:  Sinus rhythm Left atrial enlargement IVCD, consider  atypical LBBB ED PHYSICIAN INTERPRETATION AVAILABLE IN [place]  Confirmed by TEST, Record ([id number]) on 01/23/2015 8:32:20 AM      MDM   Final diagnoses:  Seizure    No indication for IVC or hold.  Pt willing to stay for lab eval related to sz.     Tanna Furry, MD 01/24/15 [phone]

## 2015-01-22 NOTE — Discharge Instructions (Signed)

## 2015-01-22 NOTE — ED Notes (Signed)
Patient arrives via EMS with c/o seizure activity today as well as last night. Patient had seizure just PTA. Patient became very agitated post-ictally. Patient making suicidal comments to EMS/family. [employer] escorted EMS/patient to ED. Patient alert. EMS reports patient has made statements to "jump off a bridge and stab self".

## 2015-01-22 NOTE — ED Notes (Signed)
Per Dr Jeneen Rinks, no sitter needed. [employer] notified and left department. Patient remains cooperative at this time.

## 2015-01-24 ENCOUNTER — Encounter (HOSPITAL_COMMUNITY): Payer: Self-pay | Admitting: Emergency Medicine

## 2015-01-24 ENCOUNTER — Telehealth: Payer: Self-pay

## 2015-01-24 ENCOUNTER — Emergency Department (HOSPITAL_COMMUNITY)
Admission: EM | Admit: 2015-01-24 | Discharge: 2015-01-24 | Disposition: A | Discharge status: Home Health | Payer: Medicaid Other | Attending: Emergency Medicine | Admitting: Emergency Medicine

## 2015-01-24 DIAGNOSIS — R55 Syncope and collapse: Secondary | ICD-10-CM | POA: Diagnosis present

## 2015-01-24 DIAGNOSIS — Z880 Allergy status to penicillin: Secondary | ICD-10-CM | POA: Insufficient documentation

## 2015-01-24 DIAGNOSIS — Z79899 Other long term (current) drug therapy: Secondary | ICD-10-CM | POA: Insufficient documentation

## 2015-01-24 LAB — CBG MONITORING, ED: GLUCOSE-CAPILLARY: 98 mg/dL (ref 65–99)

## 2015-01-24 MED ORDER — DIVALPROEX SODIUM 250 MG PO DR TAB: 250.0000 mg | DELAYED_RELEASE_TABLET | Freq: Every day | ORAL | Status: DC

## 2015-01-24 MED ORDER — SODIUM CHLORIDE 0.9 % IV BOLUS (SEPSIS): 1500.0000 mL | Freq: Once | INTRAVENOUS | Status: DC

## 2015-01-24 NOTE — ED Provider Notes (Addendum)
CSN: [id number]     Arrival date & time 01/24/15  1024 History   First MD Initiated Contact with Patient 01/24/15 1103     Chief Complaint  Patient presents with  . Loss of Consciousness     HPI Patient reports lightheadedness earlier today followed by syncope.  He states that he "blacked out.  No preceding chest pain or palpitations.  He denies tongue biting or urinary incontinence.  He does have a history of seizure disorder.  He states he is currently out of his Depakote.  He's been seen in the emergency department 23 times at the past 6 months for her multitude of reasons including syncope on multiple occasions.  He saw his primary care doctor 3 days ago.  Denies melena or hematochezia.  No chest pain or palpitations.  No shortness of breath.  No abdominal pain.  Denies nausea vomiting diarrhea.  Reports that he only ate a hot dog this morning for breakfast.  No other complaints at this time.  Asymptomatic currently.  No family history of early cardiac death   Past Medical History  Diagnosis Date  . Depression   . Seizures    History reviewed. No pertinent past surgical history. Family History  Problem Relation Age of Onset  . Heart disease Mother    Social History  Substance Use Topics  . Smoking status: Never Smoker   . Smokeless tobacco: Never Used  . Alcohol Use: No    Review of Systems  All other systems reviewed and are negative.     Allergies  Penicillins and Codeine  Home Medications   Prior to Admission medications   Medication Sig Start Date End Date Taking? Authorizing Provider  albuterol (PROVENTIL HFA;VENTOLIN HFA) 108 (90 BASE) MCG/ACT inhaler Inhale 2 puffs into the lungs every 6 (six) hours as needed for wheezing or shortness of breath (asthma symptoms).   Yes Historical Provider, MD  divalproex (DEPAKOTE) 250 MG DR tablet Take 1 tablet (250 mg total) by mouth daily. 01/22/15  Yes Tanna Furry, MD  ibuprofen (ADVIL,MOTRIN) 400 MG tablet Take 1 tablet (400  mg total) by mouth every 6 (six) hours as needed. Patient taking differently: Take 400 mg by mouth every 6 (six) hours as needed for moderate pain.  01/10/15  Yes Marella Chimes, PA-C  lamoTRIgine (LAMICTAL) 100 MG tablet Take 2 tablets (200 mg total) by mouth daily. 01/22/15  Yes Tanna Furry, MD   BP 121/80 mmHg  Pulse 60  Temp(Src) 98.2 F (36.8 C) (Oral)  Resp 16  SpO2 100% Physical Exam  Constitutional: He is oriented to person, place, and time. He appears well-developed and well-nourished.  HENT:  Head: Normocephalic and atraumatic.  Eyes: EOM are normal.  Neck: Normal range of motion.  Cardiovascular: Normal rate, regular rhythm, normal heart sounds and intact distal pulses.   Pulmonary/Chest: Effort normal and breath sounds normal. No respiratory distress.  Abdominal: Soft. He exhibits no distension. There is no tenderness.  Musculoskeletal: Normal range of motion.  Neurological: He is alert and oriented to person, place, and time.  Skin: Skin is warm and dry.  Psychiatric: He has a normal mood and affect. Judgment normal.  Nursing note and vitals reviewed.   ED Course  Procedures (including critical care time) Labs Review Labs Reviewed  CBG MONITORING, ED   HEMOGLOBIN  Date Value Ref Range Status  01/22/2015 15.2 13.0 - 17.0 g/dL Final  01/10/2015 14.7 13.0 - 17.0 g/dL Final  01/06/2015 14.8 13.0 - 17.0  g/dL Final  11/16/2014 14.8 13.0 - 17.0 g/dL Final    BUN  Date Value Ref Range Status  01/22/2015 14 6 - 20 mg/dL Final  01/10/2015 13 6 - 20 mg/dL Final  01/06/2015 13 6 - 20 mg/dL Final  11/16/2014 13 6 - 20 mg/dL Final   CREAT  Date Value Ref Range Status  10/14/2014 0.84 0.50 - 1.35 mg/dL Final   CREATININE, SER  Date Value Ref Range Status  01/22/2015 0.96 0.61 - 1.24 mg/dL Final  01/10/2015 1.10 0.61 - 1.24 mg/dL Final  01/06/2015 1.01 0.61 - 1.24 mg/dL Final  11/16/2014 0.86 0.61 - 1.24 mg/dL Final    SODIUM  Date Value Ref Range Status   01/22/2015 140 135 - 145 mmol/L Final  01/10/2015 142 135 - 145 mmol/L Final  01/06/2015 138 135 - 145 mmol/L Final  11/16/2014 137 135 - 145 mmol/L Final       Imaging Review No results found. I have personally reviewed and evaluated these images and lab results as part of my medical decision-making.   EKG Interpretation   Date/Time:  Friday January 24 2015 10:36:04 EDT Ventricular Rate:  73 PR Interval:  172 QRS Duration: 113 QT Interval:  375 QTC Calculation: 413 R Axis:   -16 Text Interpretation:  Sinus rhythm Consider right atrial enlargement  Borderline intraventricular conduction delay RSR' in V1 or V2, probably  normal variant ST elev, probable normal early repol pattern No significant  change was found Confirmed by Hyatt Capobianco  MD, Lennette Bihari ([id number]) on 01/24/2015  11:03:29 AM      MDM   Final diagnoses:  None    I spoke with the case management team here at [facility] who will be in touch with the case management team at [facility] to help him get his medications.  I doubt this was a seizure today.  I recommended ongoing oral hydration at home.  Primary care follow-up.  EKG without ischemic changes.  No arrhythmias noted.patient was seen last night in emergency department.  Labs reviewed that were obtained less than 24 hours ago.  Hemoglobin normal.  Slice normal.  Creatinine normal.    Jola Schmidt, MD 01/24/15 [place], MD 01/24/15 1116

## 2015-01-24 NOTE — Progress Notes (Addendum)
[place] spoke with [facility] pharmacist who states pt can come to [facility] and have medications credited to him today Cm called pt on his cell [phone] and directed him to [facility] pharmacy to pick up his medication today and they will be credited to him there He voiced understanding and repeated what he needed to do to CM. He voices he is awaiting a ride from the ED to [facility].  Cm also received a call from [facility] CM who will be giving pt a new pcp appt and calling him at [phone] to informed him about  His [facility] f/u appt  EDP, Campos updated pt could get his medication credited today at [facility]   [address] This is a [place] access pt with discounted medication program (medications $3 and less)  CM consulted by [employer] for medication assistance CM spoke with pt about 2 options 1) getting $3 from family or church to go to [facility] to get medication from the pharmacy                                             2) speak with a local pharmacy to see if medication can be credited until he receives SSI funds on 01/30/15  Pt reports he was kicked out of his girlfriend home and his uncle help him to move to a rooming house as of today which costed moving fee Pt has a medicaid phone and another cell phone not medicaid on his bed Pt confirms he is seen by Dr Feliciana Rossetti at [facility].  CM discussed [facility] not having a program to assist with his $3 co pay Pt states he believes he can ask his sister to help him with $3.  Cm offered to contact [facility] pharmacy and the CMs to discuss his concerns and need for medication

## 2015-01-24 NOTE — Telephone Encounter (Signed)
This Case Manager received call from Joellyn Quails, RN CM regarding patient needing to pick up Depakote from [facility]. She indicated patient on his way to pharmacy to pick up medication.   This Case Manager obtained ED follow-up appointment on 01/31/15 at 1200 with Chari Manning, NP. Patient verbalized understanding. Inquired if patient at pharmacy; patient indicated he was but did not have a script for Depakote.  After EMR review, this Case Manager determined Depakote was not e-scribed to [facility]. Spoke with Juliann Pulse, Pharmacy Tech, who indicated script was also not faxed to pharmacy.  Joellyn Quails, RN CM called and informed that patient does not have have script for Depakote and medication not e-scribed. She indicated script would be faxed to [facility]. Spoke with Kerby Moors, Pharmacist at [facility] who indicated prescription would be ready for patient in about 20-30 minutes.  Patient called to provide update. Patient indicated he left to run an errand but would come to pharmacy today to pick up medication. Expressed importance of patient picking up medication today and reminded patient of pharmacy hours. Patient verbalized understanding.

## 2015-01-24 NOTE — ED Notes (Signed)
[place] "[place]".  Pt denies hitting his head.

## 2015-01-31 ENCOUNTER — Ambulatory Visit: Payer: Self-pay | Admitting: Internal Medicine

## 2015-02-14 ENCOUNTER — Emergency Department (HOSPITAL_COMMUNITY)
Admission: EM | Admit: 2015-02-14 | Discharge: 2015-02-15 | Disposition: A | Discharge status: Home / Self Care | Payer: Medicaid Other | Attending: Emergency Medicine | Admitting: Emergency Medicine

## 2015-02-14 ENCOUNTER — Encounter (HOSPITAL_COMMUNITY): Payer: Self-pay | Admitting: *Deleted

## 2015-02-14 DIAGNOSIS — R112 Nausea with vomiting, unspecified: Secondary | ICD-10-CM | POA: Diagnosis not present

## 2015-02-14 DIAGNOSIS — Z79899 Other long term (current) drug therapy: Secondary | ICD-10-CM | POA: Insufficient documentation

## 2015-02-14 DIAGNOSIS — R1084 Generalized abdominal pain: Secondary | ICD-10-CM | POA: Diagnosis not present

## 2015-02-14 DIAGNOSIS — Z880 Allergy status to penicillin: Secondary | ICD-10-CM | POA: Diagnosis not present

## 2015-02-14 DIAGNOSIS — G40909 Epilepsy, unspecified, not intractable, without status epilepticus: Secondary | ICD-10-CM | POA: Insufficient documentation

## 2015-02-14 DIAGNOSIS — F329 Major depressive disorder, single episode, unspecified: Secondary | ICD-10-CM | POA: Insufficient documentation

## 2015-02-14 LAB — CBC
HCT: 43.7 % (ref 39.0–52.0)
Hemoglobin: 15.1 g/dL (ref 13.0–17.0)
MCH: 30.1 pg (ref 26.0–34.0)
MCHC: 34.6 g/dL (ref 30.0–36.0)
MCV: 87.2 fL (ref 78.0–100.0)
PLATELETS: 180 10*3/uL (ref 150–400)
RBC: 5.01 MIL/uL (ref 4.22–5.81)
RDW: 12.7 % (ref 11.5–15.5)
WBC: 5.6 10*3/uL (ref 4.0–10.5)

## 2015-02-14 LAB — COMPREHENSIVE METABOLIC PANEL
ALT: 18 U/L (ref 17–63)
AST: 23 U/L (ref 15–41)
Albumin: 4.5 g/dL (ref 3.5–5.0)
Alkaline Phosphatase: 57 U/L (ref 38–126)
Anion gap: 6 (ref 5–15)
BILIRUBIN TOTAL: 1 mg/dL (ref 0.3–1.2)
BUN: 15 mg/dL (ref 6–20)
CO2: 29 mmol/L (ref 22–32)
CREATININE: 0.93 mg/dL (ref 0.61–1.24)
Calcium: 9.8 mg/dL (ref 8.9–10.3)
Chloride: 105 mmol/L (ref 101–111)
GFR calc Af Amer: >60 mL/min (ref >60)
Glucose, Bld: 81 mg/dL (ref 65–99)
Potassium: 4.1 mmol/L (ref 3.5–5.1)
Sodium: 140 mmol/L (ref 135–145)
TOTAL PROTEIN: 6.9 g/dL (ref 6.5–8.1)

## 2015-02-14 LAB — LIPASE, BLOOD: Lipase: 26 U/L (ref 22–51)

## 2015-02-14 MED ORDER — ONDANSETRON 4 MG PO TBDP
8.0000 mg | ORAL_TABLET | Freq: Once | ORAL | Status: AC
  Administered 2015-02-14: 8 mg via ORAL
  Filled 2015-02-14: qty 2

## 2015-02-14 MED ORDER — DICYCLOMINE HCL 10 MG/ML IM SOLN
20.0000 mg | Freq: Once | INTRAMUSCULAR | Status: AC
  Administered 2015-02-14: 20 mg via INTRAMUSCULAR
  Filled 2015-02-14: qty 2

## 2015-02-14 NOTE — ED Notes (Signed)
The pt is c/o some abd cramps with n v no diarrhea after he ate some lasagna  Within the past hour

## 2015-02-14 NOTE — ED Provider Notes (Signed)
CSN: [id number]     Arrival date & time 02/14/15  1929 History   First MD Initiated Contact with Patient 02/14/15 2029     Chief Complaint  Patient presents with  . Abdominal Pain     (Consider location/radiation/quality/duration/timing/severity/associated sxs/prior Treatment) HPI Comments: The pt is c/o some abd cramps with n v no diarrhea after he ate some lasagna an hour prior to arrival. He states he has had two episodes of non-bloody nonbilious emesis with generalized abdominal cramping pain. No modifying or aggravating factors. No abdominal surgical history.  Patient is a 34 y.o. male presenting with abdominal pain. The history is provided by the patient.  Abdominal Pain Pain location:  Generalized Pain quality: cramping   Pain radiates to:  Does not radiate Pain severity:  Moderate Onset quality:  Sudden Timing:  Intermittent Chronicity:  New Context: eating   Relieved by:  None tried Worsened by:  Nothing tried Ineffective treatments:  None tried Associated symptoms: nausea and vomiting   Associated symptoms: no diarrhea   Vomiting:    Quality:  Stomach contents   Number of occurrences:  3   Past Medical History  Diagnosis Date  . Depression   . Seizures    History reviewed. No pertinent past surgical history. Family History  Problem Relation Age of Onset  . Heart disease Mother    Social History  Substance Use Topics  . Smoking status: Never Smoker   . Smokeless tobacco: Never Used  . Alcohol Use: No    Review of Systems  Gastrointestinal: Positive for nausea, vomiting and abdominal pain. Negative for diarrhea.  All other systems reviewed and are negative.     Allergies  Penicillins and Codeine  Home Medications   Prior to Admission medications   Medication Sig Start Date End Date Taking? Authorizing Provider  albuterol (PROVENTIL HFA;VENTOLIN HFA) 108 (90 BASE) MCG/ACT inhaler Inhale 2 puffs into the lungs every 6 (six) hours as needed for  wheezing or shortness of breath (asthma symptoms).   Yes Historical Provider, MD  divalproex (DEPAKOTE) 250 MG DR tablet Take 1 tablet (250 mg total) by mouth daily. 01/24/15  Yes Jola Schmidt, MD  ibuprofen (ADVIL,MOTRIN) 400 MG tablet Take 1 tablet (400 mg total) by mouth every 6 (six) hours as needed. Patient taking differently: Take 400 mg by mouth every 6 (six) hours as needed for moderate pain.  01/10/15  Yes Marella Chimes, PA-C  lamoTRIgine (LAMICTAL) 100 MG tablet Take 2 tablets (200 mg total) by mouth daily. 01/22/15  Yes Tanna Furry, MD  ondansetron (ZOFRAN ODT) 4 MG disintegrating tablet Take 1 tablet (4 mg total) by mouth every 8 (eight) hours as needed for nausea or vomiting. 02/15/15   Baron Sane, PA-C   BP 112/72 mmHg  Pulse 43  Temp(Src) 98.2 F (36.8 C) (Oral)  Resp 14  Ht 6' (1.829 m)  SpO2 98% Physical Exam  Constitutional: He is oriented to person, place, and time. He appears well-developed and well-nourished. No distress.  HENT:  Head: Normocephalic and atraumatic.  Right Ear: External ear normal.  Left Ear: External ear normal.  Nose: Nose normal.  Mouth/Throat: Oropharynx is clear and moist.  Eyes: Conjunctivae are normal.  Neck: Normal range of motion. Neck supple.  No nuchal rigidity.   Cardiovascular: Normal rate, regular rhythm and normal heart sounds.   Pulmonary/Chest: Effort normal and breath sounds normal.  Abdominal: Soft. There is no tenderness.  Musculoskeletal: Normal range of motion.  Neurological: He is alert  and oriented to person, place, and time.  Skin: Skin is warm and dry. He is not diaphoretic.  Psychiatric: He has a normal mood and affect.  Nursing note and vitals reviewed.   ED Course  Procedures (including critical care time) Medications  dicyclomine (BENTYL) injection 20 mg (20 mg Intramuscular Given 02/14/15 2126)  ondansetron (ZOFRAN-ODT) disintegrating tablet 8 mg (8 mg Oral Given 02/14/15 2126)    Labs Review Labs  Reviewed  URINALYSIS, ROUTINE W REFLEX MICROSCOPIC (NOT AT [facility]) - Abnormal; Notable for the following:    APPearance CLOUDY (*)    All other components within normal limits  LIPASE, BLOOD  COMPREHENSIVE METABOLIC PANEL  CBC    Imaging Review No results found. I have personally reviewed and evaluated these images and lab results as part of my medical decision-making.   EKG Interpretation None      MDM   Final diagnoses:  Generalized abdominal pain  Non-intractable vomiting with nausea, vomiting of unspecified type   Filed Vitals:   02/15/15 0047  BP:   Pulse:   Temp:   Resp: 14   Afebrile, NAD, non-toxic appearing, AAOx4.   Patient is nontoxic, nonseptic appearing, in no apparent distress.  Patient's pain and other symptoms adequately managed in emergency department.  Labs, imaging and vitals reviewed.  Patient does not meet the SIRS or Sepsis criteria.  On repeat exam patient does not have a surgical abdomin and there are no peritoneal signs.  No indication of appendicitis, bowel obstruction, bowel perforation, cholecystitis, diverticulitis.  Patient discharged home with symptomatic treatment and given strict instructions for follow-up with their primary care physician.  I have also discussed reasons to return immediately to the ER.  Patient expresses understanding and agrees with plan.      Baron Sane, PA-C 02/15/15 0106  Ripley Fraise, MD 02/17/15 1050

## 2015-02-15 LAB — URINALYSIS, ROUTINE W REFLEX MICROSCOPIC
BILIRUBIN URINE: NEGATIVE
GLUCOSE, UA: NEGATIVE mg/dL
HGB URINE DIPSTICK: NEGATIVE
KETONES UR: NEGATIVE mg/dL
Leukocytes, UA: NEGATIVE
Nitrite: NEGATIVE
PROTEIN: NEGATIVE mg/dL
Specific Gravity, Urine: 1.021 (ref 1.005–1.030)
Urobilinogen, UA: 1 mg/dL (ref 0.0–1.0)
pH: 7 (ref 5.0–8.0)

## 2015-02-15 MED ORDER — ONDANSETRON 4 MG PO TBDP: 4.0000 mg | ORAL_TABLET | Freq: Three times a day (TID) | ORAL | Status: DC | PRN

## 2015-02-15 NOTE — Discharge Instructions (Signed)
Please follow up with your primary care physician in 1-2 days. If you do not have one please call the [facility] number listed above. Please read all discharge instructions and return precautions.   Gastritis, Adult Gastritis is soreness and swelling (inflammation) of the lining of the stomach. Gastritis can develop as a sudden onset (acute) or long-term (chronic) condition. If gastritis is not treated, it can lead to stomach bleeding and ulcers. CAUSES  Gastritis occurs when the stomach lining is weak or damaged. Digestive juices from the stomach then inflame the weakened stomach lining. The stomach lining may be weak or damaged due to viral or bacterial infections. One common bacterial infection is the Helicobacter pylori infection. Gastritis can also result from excessive alcohol consumption, taking certain medicines, or having too much acid in the stomach.  SYMPTOMS  In some cases, there are no symptoms. When symptoms are present, they may include:  Pain or a burning sensation in the upper abdomen.  Nausea.  Vomiting.  An uncomfortable feeling of fullness after eating. DIAGNOSIS  Your caregiver may suspect you have gastritis based on your symptoms and a physical exam. To determine the cause of your gastritis, your caregiver may perform the following:  Blood or stool tests to check for the H pylori bacterium.  Gastroscopy. A thin, flexible tube (endoscope) is passed down the esophagus and into the stomach. The endoscope has a light and camera on the end. Your caregiver uses the endoscope to view the inside of the stomach.  Taking a tissue sample (biopsy) from the stomach to examine under a microscope. TREATMENT  Depending on the cause of your gastritis, medicines may be prescribed. If you have a bacterial infection, such as an H pylori infection, antibiotics may be given. If your gastritis is caused by too much acid in the stomach, H2 blockers or antacids may be  given. Your caregiver may recommend that you stop taking aspirin, ibuprofen, or other nonsteroidal anti-inflammatory drugs (NSAIDs). HOME CARE INSTRUCTIONS  Only take over-the-counter or prescription medicines as directed by your caregiver.  If you were given antibiotic medicines, take them as directed. Finish them even if you start to feel better.  Drink enough fluids to keep your urine clear or pale yellow.  Avoid foods and drinks that make your symptoms worse, such as:  Caffeine or alcoholic drinks.  Chocolate.  Peppermint or mint flavorings.  Garlic and onions.  Spicy foods.  Citrus fruits, such as oranges, lemons, or limes.  Tomato-based foods such as sauce, chili, salsa, and pizza.  Fried and fatty foods.  Eat small, frequent meals instead of large meals. SEEK IMMEDIATE MEDICAL CARE IF:   You have black or dark red stools.  You vomit blood or material that looks like coffee grounds.  You are unable to keep fluids down.  Your abdominal pain gets worse.  You have a fever.  You do not feel better after 1 week.  You have any other questions or concerns. MAKE SURE YOU:  Understand these instructions.  Will watch your condition.  Will get help right away if you are not doing well or get worse. Document Released: 05/11/2001 Document Revised: 11/16/2011 Document Reviewed: 06/30/2011 [facility] Patient Information 2015 [place], [place]. This information is not intended to replace advice given to you by your health care provider. Make sure you discuss any questions you have with your health care provider.

## 2015-03-11 ENCOUNTER — Ambulatory Visit (INDEPENDENT_AMBULATORY_CARE_PROVIDER_SITE_OTHER): Payer: Medicaid Other | Admitting: Neurology

## 2015-03-11 ENCOUNTER — Encounter: Payer: Self-pay | Admitting: Neurology

## 2015-03-11 VITALS — BP 130/70 | HR 65 | Ht 74.0 in | Wt 157.3 lb

## 2015-03-11 DIAGNOSIS — F319 Bipolar disorder, unspecified: Secondary | ICD-10-CM | POA: Diagnosis not present

## 2015-03-11 DIAGNOSIS — G40309 Generalized idiopathic epilepsy and epileptic syndromes, not intractable, without status epilepticus: Secondary | ICD-10-CM | POA: Diagnosis not present

## 2015-03-11 MED ORDER — LAMOTRIGINE 100 MG PO TABS: 200.0000 mg | ORAL_TABLET | Freq: Every day | ORAL | Status: DC

## 2015-03-11 NOTE — Patient Instructions (Signed)
1. Increase your Lamotrigine ER 100mg : start taking 2 tablets at night 2. In a week, have bloodwork done for Lamictal level 3. Contact [facility] for treatment of Bipolar disorder 4. Follow-up in 4 months  Seizure Precautions: 1. If medication has been prescribed for you to prevent seizures, take it exactly as directed.  Do not stop taking the medicine without talking to your doctor first, even if you have not had a seizure in a long time.   2. Avoid activities in which a seizure would cause danger to yourself or to others.  Don't operate dangerous machinery, swim alone, or climb in high or dangerous places, such as on ladders, roofs, or girders.  Do not drive unless your doctor says you may.  3. If you have any warning that you may have a seizure, lay down in a safe place where you can't hurt yourself.    4.  No driving for 6 months from last seizure, as per [facility].   Please refer to the following link on the [place] website for more information: http://[email or web address].org/[email or web address]/driving/drivingu.cfm   5.  Maintain good sleep hygiene.  6.  Contact your doctor if you have any problems that may be related to the medicine you are taking.  7.  Call 911 and bring the patient back to the ED if:        A.  The seizure lasts longer than 5 minutes.       B.  The patient doesn't awaken shortly after the seizure  C.  The patient has new problems such as difficulty seeing, speaking or moving  D.  The patient was injured during the seizure  E.  The patient has a temperature over 102 F (39C)  F.  The patient vomited and now is having trouble breathing

## 2015-03-11 NOTE — Progress Notes (Signed)
NEUROLOGY FOLLOW UP OFFICE NOTE  Franklin [name] [id number]  HISTORY OF PRESENT ILLNESS: I had the pleasure of seeing Franklin [name] in follow-up in the neurology clinic on 03/01/2015.  The patient was last seen 2 months ago for seizures. Records and images were personally reviewed where available. His 1-hour sleep-deprived EEG was normal. He was unable to do MRI brain even with open MRI due to significant claustrophobia. He has been to the ER several times since his last visit for report of seizures. He was started on Lamotrigine ER in June 2016, on his last visit he was instructed to increase dose to 200mg  daily, but reports that he only takes 100mg  at night. When we discussed doing blood levels, he states "you won't find any," reporting that he had been off Lamictal for a month since his girlfriend left him. Then he later on states that he got his refills a week ago. He is a poor historian and non-compliant with medications. He had stopped Depakote a few months ago. He reports having a seizure yesterday, he was by himself but states that he seized for an hour with shaking and kicking, in and out of it. He has occasional dizziness, he fell the other day while walking and feeling dizzy.   HPI: This is a 34 yo RH man with a history of bipolar disorder, mild cognitive impairment, and seizures since age 34 or 34. He and his cousin both report that seizures would start with staring and unresponsiveness, followed by low amplitude shaking of both hands. Sometimes he would have violent leg kicking. They state that shaking last from 30 minutes up to "1-1/2 hours" followed by confusion for around 20 minutes. He would be sleepy after a seizure. He has had urinary incontinence with some of them, no tongue bite or other significant injuries. He reports all seizures occur during wakefulness, no nocturnal seizures that he is aware of, but woke up one time with urinary incontinence. He denies any olfactory/gustatory  hallucinations, deja vu, rising epigastric sensation, focal numbness/tingling/weakness, myoclonic jerks. He occasionally drops things from his hands. He reports that Depakote was started at age 34, however he has only been taking 250mg /day, stating that when he was on 500 or 750mg , he would be "like a zombie, walking into doors." The low dose Depakote has not controlled the seizures, he reports seizures occurring every 3-4 days, longest seizure-free interval probably 1-2 months.   On review of records on EPIC, he has been to the ER several times since 2008 with no note of a seizure history until 2014. There are some records of patient being on Depakote, Tegretol, and Haldol, presumably for Bipolar disorder. He reports chronic headaches occurring 2-3 times a week with sharp pain in the back of his head, "like a needle sticking in it" lasting 30-45 minutes. He would feel lightheaded. No associated nausea, vomiting, photo/phonophobia, or visual obscurations. He takes Tylenol or aspirin with good effect. He lives with his girlfriend. He finished 12th grade in special education classes. He does not drive.  Epilepsy Risk Factors: His maternal uncle used to have seizures. He was born premature, with mild cognitive impairment. Otherwise he denies any history of febrile convulsions, CNS infections such as meningitis/encephalitis, significant traumatic brain injury, neurosurgical procedures.  PAST MEDICAL HISTORY: Past Medical History  Diagnosis Date  . Depression   . Seizures ([place])     MEDICATIONS: Current Outpatient Prescriptions on File Prior to Visit  Medication Sig Dispense Refill  . albuterol (PROVENTIL HFA;VENTOLIN  HFA) 108 (90 BASE) MCG/ACT inhaler Inhale 2 puffs into the lungs every 6 (six) hours as needed for wheezing or shortness of breath (asthma symptoms).    . divalproex (DEPAKOTE) 250 MG DR tablet Take 1 tablet (250 mg total) by mouth daily. (not taking) 60 tablet 0  . ibuprofen  (ADVIL,MOTRIN) 400 MG tablet Take 1 tablet (400 mg total) by mouth every 6 (six) hours as needed. (Patient taking differently: Take 400 mg by mouth every 6 (six) hours as needed for moderate pain. ) 30 tablet 0  . lamoTRIgine (LAMICTAL) 100 MG tablet Take 2 tablets (200 mg total) by mouth daily (reports only taking 1 tablet at night) 60 tablet 0  . ondansetron (ZOFRAN ODT) 4 MG disintegrating tablet Take 1 tablet (4 mg total) by mouth every 8 (eight) hours as needed for nausea or vomiting. 20 tablet 0   No current facility-administered medications on file prior to visit.    ALLERGIES: Allergies  Allergen Reactions  . Penicillins Nausea And Vomiting  . Codeine Nausea And Vomiting    FAMILY HISTORY: Family History  Problem Relation Age of Onset  . Heart disease Mother     SOCIAL HISTORY: Social History   Social History  . Marital Status: Single    Spouse Name: N/A  . Number of Children: N/A  . Years of Education: N/A   Occupational History  . Not on file.   Social History Main Topics  . Smoking status: Never Smoker   . Smokeless tobacco: Never Used  . Alcohol Use: No  . Drug Use: No  . Sexual Activity: Not Currently   Other Topics Concern  . Not on file   Social History Narrative    REVIEW OF SYSTEMS: Constitutional: No fevers, chills, or sweats, no generalized fatigue, change in appetite Eyes: No visual changes, double vision, eye pain Ear, nose and throat: No hearing loss, ear pain, nasal congestion, sore throat Cardiovascular: No chest pain, palpitations Respiratory:  No shortness of breath at rest or with exertion, wheezes GastrointestinaI: No nausea, vomiting, diarrhea, abdominal pain, fecal incontinence Genitourinary:  No dysuria, urinary retention or frequency Musculoskeletal:  No neck pain, back pain Integumentary: No rash, pruritus, skin lesions Neurological: as above Psychiatric: No depression, insomnia, anxiety Endocrine: No palpitations, fatigue,  diaphoresis, mood swings, change in appetite, change in weight, increased thirst Hematologic/Lymphatic:  No anemia, purpura, petechiae. Allergic/Immunologic: no itchy/runny eyes, nasal congestion, recent allergic reactions, rashes  PHYSICAL EXAM: Filed Vitals:   03/11/15 1130  BP: 130/70  Pulse: 65   General: No acute distress Head:  Normocephalic/atraumatic Neck: supple, no paraspinal tenderness, full range of motion Heart:  Regular rate and rhythm Lungs:  Clear to auscultation bilaterally Back: No paraspinal tenderness Skin/Extremities: No rash, no edema Neurological Exam: alert and oriented to person, place, and time. No aphasia or dysarthria. Fund of knowledge is appropriate.  Recent and remote memory are intact.  Attention and concentration are normal.    Able to name objects and repeat phrases. Cranial nerves: Pupils equal, round, reactive to light.  Fundoscopic exam unremarkable, no papilledema. Extraocular movements intact with no nystagmus. Visual fields full. Facial sensation intact. No facial asymmetry. Tongue, uvula, palate midline.  Motor: Bulk and tone normal, muscle strength 5/5 throughout with no pronator drift.  Sensation to light touch intact.  No extinction to double simultaneous stimulation.  Deep tendon reflexes 2+ throughout, toes downgoing.  Finger to nose testing intact.  Gait wide-based, no ataxia. Romberg negative.  IMPRESSION: This is a  34 yo RH man with a history of bipolar disorder, mild cognitive impairment, with a report of history of seizures since age 5 or 67. Review of records from ER visits in the past have no indication of seizures or seizure medication until 2014. He states he has never seen a neurologist in the past. Seizures consist of staring followed by shaking, he has had urinary incontinence with some of them, however the prolonged duration of some (lasting 1-1/2 hours) also raises the possibility of non-epileptic events. 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 His EEG is normal, unable to do MRI brain due to severe claustrophobia. He is poorly compliant with medication, I had an extensive discussion with him that he will continue to have seizures if he stops his medications for a month. He was instructed to increase Lamotrigine ER to 200mg  qhs. Lamictal level will be checked in a week. He had already discontinued Depakote low dose a few months ago, we will not resume this and continue on monotherapy with Lamictal. He would be a good candidate for inpatient video EEG monitoring to classify his seizures if they continue despite adequate doses of AEDs. He was encouraged to follow-up with Behavioral Medicine for Bipolar disorder. He will follow-up in 4 months and knows to call our office for any changes.   Thank you for allowing me to participate in his care.  Please do not hesitate to call for any questions or concerns.  The duration of this appointment visit was 24 minutes of face-to-face time with the patient.  Greater than 50% of this time was spent in counseling, explanation of diagnosis, planning of further management, and coordination of care.   Ellouise Newer, M.D.

## 2015-03-12 ENCOUNTER — Emergency Department (HOSPITAL_COMMUNITY)
Admission: EM | Admit: 2015-03-12 | Discharge: 2015-03-12 | Disposition: A | Discharge status: Home / Self Care | Payer: Medicaid Other | Attending: Emergency Medicine | Admitting: Emergency Medicine

## 2015-03-12 ENCOUNTER — Encounter (HOSPITAL_COMMUNITY): Payer: Self-pay | Admitting: *Deleted

## 2015-03-12 DIAGNOSIS — Z880 Allergy status to penicillin: Secondary | ICD-10-CM | POA: Diagnosis not present

## 2015-03-12 DIAGNOSIS — F329 Major depressive disorder, single episode, unspecified: Secondary | ICD-10-CM | POA: Insufficient documentation

## 2015-03-12 DIAGNOSIS — Z202 Contact with and (suspected) exposure to infections with a predominantly sexual mode of transmission: Secondary | ICD-10-CM | POA: Diagnosis not present

## 2015-03-12 DIAGNOSIS — Z79899 Other long term (current) drug therapy: Secondary | ICD-10-CM | POA: Insufficient documentation

## 2015-03-12 DIAGNOSIS — Z711 Person with feared health complaint in whom no diagnosis is made: Secondary | ICD-10-CM

## 2015-03-12 DIAGNOSIS — G40909 Epilepsy, unspecified, not intractable, without status epilepticus: Secondary | ICD-10-CM | POA: Insufficient documentation

## 2015-03-12 LAB — HIV ANTIBODY (ROUTINE TESTING W REFLEX): HIV Screen 4th Generation wRfx: NONREACTIVE

## 2015-03-12 LAB — RPR: RPR Ser Ql: NONREACTIVE

## 2015-03-12 MED ORDER — AZITHROMYCIN 250 MG PO TABS
1000.0000 mg | ORAL_TABLET | Freq: Once | ORAL | Status: AC
  Administered 2015-03-12: 1000 mg via ORAL
  Filled 2015-03-12: qty 4

## 2015-03-12 MED ORDER — CEFTRIAXONE SODIUM 250 MG IJ SOLR
250.0000 mg | Freq: Once | INTRAMUSCULAR | Status: AC
  Administered 2015-03-12: 250 mg via INTRAMUSCULAR
  Filled 2015-03-12: qty 250

## 2015-03-12 MED ORDER — LIDOCAINE HCL (PF) 1 % IJ SOLN
5.0000 mL | Freq: Once | INTRAMUSCULAR | Status: AC
  Administered 2015-03-12: 5 mL
  Filled 2015-03-12: qty 5

## 2015-03-12 NOTE — ED Notes (Signed)
PT reports His partner may have a STD but is not sure.

## 2015-03-12 NOTE — Discharge Instructions (Signed)
Sexually Transmitted Disease °A sexually transmitted disease (STD) is a disease or infection that may be passed (transmitted) from person to person, usually during sexual activity. This may happen by way of saliva, semen, blood, vaginal mucus, or urine. Common STDs include: °· Gonorrhea. °· Chlamydia. °· Syphilis. °· HIV and AIDS. °· Genital herpes. °· Hepatitis B and C. °· Trichomonas. °· Human papillomavirus (HPV). °· Pubic lice. °· Scabies. °· Mites. °· Bacterial vaginosis. °WHAT ARE CAUSES OF STDs? °An STD may be caused by bacteria, a virus, or parasites. STDs are often transmitted during sexual activity if one person is infected. However, they may also be transmitted through nonsexual means. STDs may be transmitted after:  °· Sexual intercourse with an infected person. °· Sharing sex toys with an infected person. °· Sharing needles with an infected person or using unclean piercing or tattoo needles. °· Having intimate contact with the genitals, mouth, or rectal areas of an infected person. °· Exposure to infected fluids during birth. °WHAT ARE THE SIGNS AND SYMPTOMS OF STDs? °Different STDs have different symptoms. Some people may not have any symptoms. If symptoms are present, they may include: °· Painful or bloody urination. °· Pain in the pelvis, abdomen, vagina, anus, throat, or eyes. °· A skin rash, itching, or irritation. °· Growths, ulcerations, blisters, or sores in the genital and anal areas. °· Abnormal vaginal discharge with or without bad odor. °· Penile discharge in men. °· Fever. °· Pain or bleeding during sexual intercourse. °· Swollen glands in the groin area. °· Yellow skin and eyes (jaundice). This is seen with hepatitis. °· Swollen testicles. °· Infertility. °· Sores and blisters in the mouth. °HOW ARE STDs DIAGNOSED? °To make a diagnosis, your health care provider may: °· Take a medical history. °· Perform a physical exam. °· Take a sample of any discharge to examine. °· Swab the throat,  cervix, opening to the penis, rectum, or vagina for testing. °· Test a sample of your first morning urine. °· Perform blood tests. °· Perform a Pap test, if this applies. °· Perform a colposcopy. °· Perform a laparoscopy. °HOW ARE STDs TREATED? °Treatment depends on the STD. Some STDs may be treated but not cured. °· Chlamydia, gonorrhea, trichomonas, and syphilis can be cured with antibiotic medicine. °· Genital herpes, hepatitis, and HIV can be treated, but not cured, with prescribed medicines. The medicines lessen symptoms. °· Genital warts from HPV can be treated with medicine or by freezing, burning (electrocautery), or surgery. Warts may come back. °· HPV cannot be cured with medicine or surgery. However, abnormal areas may be removed from the cervix, vagina, or vulva. °· If your diagnosis is confirmed, your recent sexual partners need treatment. This is true even if they are symptom-free or have a negative culture or evaluation. They should not have sex until their health care providers say it is okay. °· Your health care provider may test you for infection again 3 months after treatment. °HOW CAN I REDUCE MY RISK OF GETTING AN STD? °Take these steps to reduce your risk of getting an STD: °· Use latex condoms, dental dams, and water-soluble lubricants during sexual activity. Do not use petroleum jelly or oils. °· Avoid having multiple sex partners. °· Do not have sex with someone who has other sex partners °· Do not have sex with anyone you do not know or who is at high risk for an STD. °· Avoid risky sex practices that can break your skin. °· Do not have sex   if you have open sores on your mouth or skin. °· Avoid drinking too much alcohol or taking illegal drugs. Alcohol and drugs can affect your judgment and put you in a vulnerable position. °· Avoid engaging in oral and anal sex acts. °· Get vaccinated for HPV and hepatitis. If you have not received these vaccines in the past, talk to your health care  provider about whether one or both might be right for you. °· If you are at risk of being infected with HIV, it is recommended that you take a prescription medicine daily to prevent HIV infection. This is called pre-exposure prophylaxis (PrEP). You are considered at risk if: °¨ You are a man who has sex with other men (MSM). °¨ You are a heterosexual man or woman and are sexually active with more than one partner. °¨ You take drugs by injection. °¨ You are sexually active with a partner who has HIV. °· Talk with your health care provider about whether you are at high risk of being infected with HIV. If you choose to begin PrEP, you should first be tested for HIV. You should then be tested every 3 months for as long as you are taking PrEP. °WHAT SHOULD I DO IF I THINK I HAVE AN STD? °· See your health care provider. °· Tell your sexual partner(s). They should be tested and treated for any STDs. °· Do not have sex until your health care provider says it is okay. °WHEN SHOULD I GET IMMEDIATE MEDICAL CARE? °Contact your health care provider right away if:  °· You have severe abdominal pain. °· You are a man and notice swelling or pain in your testicles. °· You are a woman and notice swelling or pain in your vagina. °  °This information is not intended to replace advice given to you by your health care provider. Make sure you discuss any questions you have with your health care provider. °  °Document Released: 08/07/2002 Document Revised: 06/07/2014 Document Reviewed: 12/05/2012 °Elsevier Interactive Patient Education ©2016 [employer] ° °

## 2015-03-12 NOTE — ED Provider Notes (Signed)
CSN: [id number]     Arrival date & time 03/12/15  0806 History   First MD Initiated Contact with Patient 03/12/15 [id number]     Chief Complaint  Patient presents with  . Exposure to STD   Franklin [name] is a 34 y.o. male who presents to the emergency department requesting check for STDs as he reports his girlfriend might have an STD. He reports his GF went to the ED yesterday and he believes she was treated for an STD. He is not sure what STD she was treated for. He denies symptoms of STDs. He denies history of STDs in the past. The patient denies fevers, chills, abdominal pain, nausea, vomiting, penile discharge, penile pain, genital lesions, genital rashes, testicular pain, scrotal swelling, cough, or mouth sores.   (Consider location/radiation/quality/duration/timing/severity/associated sxs/prior Treatment) HPI  Past Medical History  Diagnosis Date  . Depression   . Seizures ([place])    History reviewed. No pertinent past surgical history. Family History  Problem Relation Age of Onset  . Heart disease Mother    Social History  Substance Use Topics  . Smoking status: Never Smoker   . Smokeless tobacco: Never Used  . Alcohol Use: No    Review of Systems  Constitutional: Negative for fever and chills.  HENT: Negative for mouth sores.   Eyes: Negative for visual disturbance.  Respiratory: Negative for cough and shortness of breath.   Cardiovascular: Negative for chest pain.  Gastrointestinal: Negative for nausea, vomiting and abdominal pain.  Genitourinary: Negative for dysuria, urgency, frequency, hematuria, discharge, penile swelling, scrotal swelling, genital sores, penile pain and testicular pain.  Musculoskeletal: Negative for back pain and neck pain.  Skin: Negative for color change and rash.  Neurological: Negative for headaches.      Allergies  Penicillins and Codeine  Home Medications   Prior to Admission medications   Medication Sig Start Date End Date Taking?  Authorizing Provider  albuterol (PROVENTIL HFA;VENTOLIN HFA) 108 (90 BASE) MCG/ACT inhaler Inhale 2 puffs into the lungs every 6 (six) hours as needed for wheezing or shortness of breath (asthma symptoms).    Historical Provider, MD  ibuprofen (ADVIL,MOTRIN) 400 MG tablet Take 1 tablet (400 mg total) by mouth every 6 (six) hours as needed. Patient taking differently: Take 400 mg by mouth every 6 (six) hours as needed for moderate pain.  01/10/15   Marella Chimes, PA-C  lamoTRIgine (LAMICTAL) 100 MG tablet Take 2 tablets (200 mg total) by mouth daily. 03/11/15   Cameron Sprang, MD  ondansetron (ZOFRAN ODT) 4 MG disintegrating tablet Take 1 tablet (4 mg total) by mouth every 8 (eight) hours as needed for nausea or vomiting. 02/15/15   Baron Sane, PA-C   BP 129/72 mmHg  Pulse 60  Temp(Src) 98 F (36.7 C) (Oral)  Resp 6  Ht 6' (1.829 m)  Wt 150 lb (68.04 kg)  BMI 20.34 kg/m2  SpO2 100% Physical Exam  Constitutional: He is oriented to person, place, and time. He appears well-developed and well-nourished. No distress.  Nontoxic appearing.  HENT:  Head: Normocephalic and atraumatic.  Mouth/Throat: Oropharynx is clear and moist.  Eyes: Right eye exhibits no discharge. Left eye exhibits no discharge.  Cardiovascular: Normal rate, regular rhythm and normal heart sounds.   Pulmonary/Chest: Effort normal and breath sounds normal. No respiratory distress. He has no wheezes. He has no rales.  Abdominal: Soft. He exhibits no distension. There is no rebound and no guarding.  Abdomen is soft and nontender to palpation.  Neurological: He is alert and oriented to person, place, and time. Coordination normal.  Skin: Skin is warm and dry. No rash noted. He is not diaphoretic. No erythema. No pallor.  Psychiatric: He has a normal mood and affect. His behavior is normal.  Nursing note and vitals reviewed.   ED Course  Procedures (including critical care time) Labs Review Labs Reviewed   HIV ANTIBODY (ROUTINE TESTING)  RPR  GC/CHLAMYDIA PROBE AMP ([place]) NOT AT [facility]    Imaging Review No results found.    EKG Interpretation None      Filed Vitals:   03/12/15 0813  BP: 129/72  Pulse: 60  Temp: 98 F (36.7 C)  TempSrc: Oral  Resp: 6  Height: 6' (1.829 m)  Weight: 150 lb (68.04 kg)  SpO2: 100%     MDM   Meds given in ED:  Medications  cefTRIAXone (ROCEPHIN) injection 250 mg (250 mg Intramuscular Given 03/12/15 0839)  azithromycin (ZITHROMAX) tablet 1,000 mg (1,000 mg Oral Given 03/12/15 0839)  lidocaine (PF) (XYLOCAINE) 1 % injection 5 mL (5 mLs Other Given 03/12/15 0839)    New Prescriptions   No medications on file    Final diagnoses:  Concern about STD in male without diagnosis   This is a 34 y.o. male who presents to the emergency department requesting check for STDs as he reports his girlfriend might have an STD. He reports his GF went to the ED yesterday and he believes she was treated for an STD. He is not sure what STD she was treated for. He denies symptoms of STDs. On exam the patient is afebrile nontoxic appearing. His abdomen is soft and nontender to palpation. He denies any signs or symptoms of STDs on review of systems. Will check for gonorrhea, chlamydia, HIV, and syphilis. Advised the patient that these tests are all pending and he would need to check back for test results in 3 days. I discussed treatment options and the patient agreed to receiving treatment with Rocephin and azithromycin in the emergency department for gonorrhea and chlamydia today. I educated on safe sex practices and encouraged him to follow-up with the [facility]. I advised the patient to follow-up with their primary care provider this week. I advised the patient to return to the emergency department with new or worsening symptoms or new concerns. The patient verbalized understanding and agreement with plan.        Waynetta Pean,  PA-C 03/12/15 [place], DO 03/12/15 [phone]

## 2015-03-13 ENCOUNTER — Telehealth (HOSPITAL_COMMUNITY): Payer: Self-pay

## 2015-03-13 LAB — GC/CHLAMYDIA PROBE AMP (~~LOC~~) NOT AT ARMC
CHLAMYDIA, DNA PROBE: NEGATIVE
NEISSERIA GONORRHEA: NEGATIVE

## 2015-03-13 NOTE — Telephone Encounter (Signed)
Nurse attempted to call patient twice, reached recording stating "Subscriber you have dialed is not in service". Nurse called patient to verify home health status.

## 2015-03-13 NOTE — Telephone Encounter (Signed)
Pt calling for STD results.  ID verified x 2.  Pt informed both Gonorrhea and Chlamydia are negative and RPR and HIV non reactive.

## 2015-03-22 LAB — LAMOTRIGINE LEVEL

## 2015-03-25 ENCOUNTER — Telehealth: Payer: Self-pay | Admitting: Family Medicine

## 2015-03-25 NOTE — Telephone Encounter (Signed)
I will mail patient a letter to call me to discuss his medication. The number that we have in system for him is not in service. I tried calling his other listed contact which is his uncle and he told me that he hadn't spoken to the patient in over a month and that the number we have on file is the only number he has for the patient.

## 2015-03-25 NOTE — Telephone Encounter (Signed)
-----   Message from Cameron Sprang, MD sent at 03/24/2015  9:37 AM EDT ----- Pls let him know Lamictal is undetectable in his blood, is he taking medication regularly? He needs to take medication regularly if he wants seizures to be less frequent. Thanks

## 2015-03-27 ENCOUNTER — Telehealth: Payer: Self-pay | Admitting: Neurology

## 2015-03-27 NOTE — Telephone Encounter (Signed)
I returned patient's call. Patient received letter that I sent him about lab results and Lamictal. I asked patient if he's been taking his medication because lab show that Lamictal was undetectable in his system. He states that sometimes he forgets to take his medication. I did stress to him again the importance of taking his medication everyday as prescribed to prevent frequent seizures. He verbalized understanding. Will also update patient's phone number in [place].

## 2015-03-27 NOTE — Telephone Encounter (Signed)
Called about letter sent about lab//call back @ [phone]

## 2015-04-05 ENCOUNTER — Emergency Department (HOSPITAL_COMMUNITY)
Admission: EM | Admit: 2015-04-05 | Discharge: 2015-04-05 | Disposition: A | Discharge status: Home / Self Care | Payer: Medicaid Other | Attending: Emergency Medicine | Admitting: Emergency Medicine

## 2015-04-05 ENCOUNTER — Encounter (HOSPITAL_COMMUNITY): Payer: Self-pay | Admitting: Emergency Medicine

## 2015-04-05 ENCOUNTER — Emergency Department (HOSPITAL_COMMUNITY): Payer: Medicaid Other

## 2015-04-05 DIAGNOSIS — G40909 Epilepsy, unspecified, not intractable, without status epilepticus: Secondary | ICD-10-CM | POA: Diagnosis present

## 2015-04-05 DIAGNOSIS — F329 Major depressive disorder, single episode, unspecified: Secondary | ICD-10-CM | POA: Insufficient documentation

## 2015-04-05 DIAGNOSIS — Z880 Allergy status to penicillin: Secondary | ICD-10-CM | POA: Insufficient documentation

## 2015-04-05 LAB — CBC WITH DIFFERENTIAL/PLATELET
Basophils Absolute: 0.1 10*3/uL (ref 0.0–0.1)
Basophils Relative: 1 %
Eosinophils Absolute: 0.1 10*3/uL (ref 0.0–0.7)
Eosinophils Relative: 1 %
HCT: 42.5 % (ref 39.0–52.0)
Hemoglobin: 14.8 g/dL (ref 13.0–17.0)
Lymphocytes Relative: 23 %
Lymphs Abs: 2 10*3/uL (ref 0.7–4.0)
MCH: 30.1 pg (ref 26.0–34.0)
MCHC: 34.8 g/dL (ref 30.0–36.0)
MCV: 86.6 fL (ref 78.0–100.0)
Monocytes Absolute: 0.6 10*3/uL (ref 0.1–1.0)
Monocytes Relative: 7 %
Neutro Abs: 5.9 10*3/uL (ref 1.7–7.7)
Neutrophils Relative %: 68 %
Platelets: 179 10*3/uL (ref 150–400)
RBC: 4.91 MIL/uL (ref 4.22–5.81)
RDW: 12.1 % (ref 11.5–15.5)
WBC: 8.7 10*3/uL (ref 4.0–10.5)

## 2015-04-05 LAB — BASIC METABOLIC PANEL
Anion gap: 8 (ref 5–15)
BUN: 19 mg/dL (ref 6–20)
CO2: 26 mmol/L (ref 22–32)
Calcium: 9.3 mg/dL (ref 8.9–10.3)
Chloride: 106 mmol/L (ref 101–111)
Creatinine, Ser: 0.93 mg/dL (ref 0.61–1.24)
GFR calc Af Amer: >60 mL/min (ref >60)
GFR calc non Af Amer: >60 mL/min (ref >60)
Glucose, Bld: 83 mg/dL (ref 65–99)
Potassium: 3.8 mmol/L (ref 3.5–5.1)
Sodium: 140 mmol/L (ref 135–145)

## 2015-04-05 MED ORDER — LAMOTRIGINE 25 MG PO TABS
ORAL_TABLET | ORAL | Status: AC
  Filled 2015-04-05: qty 8

## 2015-04-05 MED ORDER — LAMOTRIGINE 200 MG PO TABS
200.0000 mg | ORAL_TABLET | Freq: Once | ORAL | Status: AC
  Administered 2015-04-05: 200 mg via ORAL
  Filled 2015-04-05: qty 1

## 2015-04-05 NOTE — ED Provider Notes (Signed)
CSN: [id number]     Arrival date & time 04/05/15  1839 History   First MD Initiated Contact with Patient 04/05/15 1849     Chief Complaint  Patient presents with  . Seizures     (Consider location/radiation/quality/duration/timing/severity/associated sxs/prior Treatment) HPI   34 year old male presenting after a possible seizure earlier today. Patient is amnestic to the events. Apparently witnessed by friends. Patient became unresponsive and had some jerking of upper extremities. No incontinence. No oral trauma. Estimates the episode lasted about a minute. Seem confused afterwards. Currently has no complaints. Patient reports working hard all day without taking any significant breaks. Missed a dose of his Lamictal today but reports is otherwise pretty compliant.  Past Medical History  Diagnosis Date  . Depression   . Seizures ([place])    History reviewed. No pertinent past surgical history. Family History  Problem Relation Age of Onset  . Heart disease Mother    Social History  Substance Use Topics  . Smoking status: Never Smoker   . Smokeless tobacco: Never Used  . Alcohol Use: No    Review of Systems  All systems reviewed and negative, other than as noted in HPI.'  Allergies  Penicillins and Codeine  Home Medications   Prior to Admission medications   Medication Sig Start Date End Date Taking? Authorizing Provider  ibuprofen (ADVIL,MOTRIN) 400 MG tablet Take 1 tablet (400 mg total) by mouth every 6 (six) hours as needed. Patient taking differently: Take 400 mg by mouth every 6 (six) hours as needed for moderate pain.  01/10/15   Marella Chimes, PA-C  lamoTRIgine (LAMICTAL) 100 MG tablet Take 2 tablets (200 mg total) by mouth daily. 03/11/15   Cameron Sprang, MD  ondansetron (ZOFRAN ODT) 4 MG disintegrating tablet Take 1 tablet (4 mg total) by mouth every 8 (eight) hours as needed for nausea or vomiting. 02/15/15   Anderson Malta Piepenbrink, PA-C   BP 146/94 mmHg  Pulse  70  Temp(Src) 98.4 F (36.9 C)  Resp 22  Ht 6\' 2"  (1.88 m)  Wt 150 lb (68.04 kg)  BMI 19.25 kg/m2  SpO2 100% Physical Exam  Constitutional: He appears well-developed and well-nourished. No distress.  HENT:  Head: Normocephalic and atraumatic.  Eyes: Conjunctivae are normal. Right eye exhibits no discharge. Left eye exhibits no discharge.  Neck: Neck supple.  Cardiovascular: Normal rate, regular rhythm and normal heart sounds.  Exam reveals no gallop and no friction rub.   No murmur heard. Pulmonary/Chest: Effort normal and breath sounds normal. No respiratory distress.  Abdominal: Soft. He exhibits no distension. There is no tenderness.  Musculoskeletal: He exhibits no edema or tenderness.  Neurological: He is alert. No cranial nerve deficit. He exhibits normal muscle tone. Coordination normal.  Speech clear. Follows commands. No focal motor deficit. Cranial nerves II through XII intact.  Skin: Skin is warm and dry.  Psychiatric: His behavior is normal.  Nursing note and vitals reviewed.   ED Course  Procedures (including critical care time) Labs Review Labs Reviewed  CBC WITH DIFFERENTIAL/PLATELET  BASIC METABOLIC PANEL    Imaging Review No results found. I have personally reviewed and evaluated these images and lab results as part of my medical decision-making.   EKG Interpretation None      MDM   Final diagnoses:  Seizure disorder ([place])    34 year old male with questionable seizure activity earlier today. Back to his baseline. He has no acute complaints. Given a dose of Lamictal in the emergency room.  Workup fairly unremarkable. I feel is appropriate for discharge at this time.    Virgel Manifold, MD 04/17/15 [phone]

## 2015-04-05 NOTE — ED Notes (Signed)
Pt grandmother and friend report pt fell and had a "seizure" approximately 20 minutes ago. Pt alert and nonverbal at present. Appears anxious. Able to follow commands.

## 2015-04-05 NOTE — Discharge Instructions (Signed)

## 2015-04-11 ENCOUNTER — Emergency Department (HOSPITAL_COMMUNITY)
Admission: EM | Admit: 2015-04-11 | Discharge: 2015-04-11 | Disposition: A | Discharge status: Home / Self Care | Payer: Medicaid Other | Attending: Emergency Medicine | Admitting: Emergency Medicine

## 2015-04-11 ENCOUNTER — Encounter (HOSPITAL_COMMUNITY): Payer: Self-pay | Admitting: *Deleted

## 2015-04-11 ENCOUNTER — Emergency Department (HOSPITAL_COMMUNITY): Payer: Medicaid Other

## 2015-04-11 DIAGNOSIS — Z880 Allergy status to penicillin: Secondary | ICD-10-CM | POA: Insufficient documentation

## 2015-04-11 DIAGNOSIS — R630 Anorexia: Secondary | ICD-10-CM | POA: Diagnosis not present

## 2015-04-11 DIAGNOSIS — R109 Unspecified abdominal pain: Secondary | ICD-10-CM | POA: Diagnosis not present

## 2015-04-11 DIAGNOSIS — R112 Nausea with vomiting, unspecified: Secondary | ICD-10-CM | POA: Diagnosis not present

## 2015-04-11 DIAGNOSIS — R079 Chest pain, unspecified: Secondary | ICD-10-CM | POA: Insufficient documentation

## 2015-04-11 DIAGNOSIS — E119 Type 2 diabetes mellitus without complications: Secondary | ICD-10-CM | POA: Insufficient documentation

## 2015-04-11 DIAGNOSIS — F329 Major depressive disorder, single episode, unspecified: Secondary | ICD-10-CM | POA: Insufficient documentation

## 2015-04-11 DIAGNOSIS — G40909 Epilepsy, unspecified, not intractable, without status epilepticus: Secondary | ICD-10-CM | POA: Insufficient documentation

## 2015-04-11 LAB — BASIC METABOLIC PANEL
Anion gap: 9 (ref 5–15)
BUN: 9 mg/dL (ref 6–20)
CALCIUM: 9.7 mg/dL (ref 8.9–10.3)
CHLORIDE: 106 mmol/L (ref 101–111)
CO2: 25 mmol/L (ref 22–32)
CREATININE: 1.05 mg/dL (ref 0.61–1.24)
Glucose, Bld: 89 mg/dL (ref 65–99)
Potassium: 3.6 mmol/L (ref 3.5–5.1)
SODIUM: 140 mmol/L (ref 135–145)

## 2015-04-11 LAB — CBC
HCT: 44.3 % (ref 39.0–52.0)
Hemoglobin: 15.4 g/dL (ref 13.0–17.0)
MCH: 30 pg (ref 26.0–34.0)
MCHC: 34.8 g/dL (ref 30.0–36.0)
MCV: 86.4 fL (ref 78.0–100.0)
PLATELETS: 191 10*3/uL (ref 150–400)
RBC: 5.13 MIL/uL (ref 4.22–5.81)
RDW: 12.2 % (ref 11.5–15.5)
WBC: 5.6 10*3/uL (ref 4.0–10.5)

## 2015-04-11 LAB — TROPONIN I

## 2015-04-11 LAB — LIPASE, BLOOD: LIPASE: 25 U/L (ref 11–51)

## 2015-04-11 MED ORDER — PROMETHAZINE HCL 25 MG PO TABS: 25.0000 mg | ORAL_TABLET | Freq: Four times a day (QID) | ORAL | Status: DC | PRN

## 2015-04-11 NOTE — ED Provider Notes (Signed)
CSN: [id number]     Arrival date & time 04/11/15  1449 History   First MD Initiated Contact with Patient 04/11/15 1738     Chief Complaint  Patient presents with  . Chest Injury  . Abdominal Pain     (Consider location/radiation/quality/duration/timing/severity/associated sxs/prior Treatment) Patient is a 34 y.o. male presenting with abdominal pain. The history is provided by the patient.  Abdominal Pain Associated symptoms: chest pain, nausea and vomiting   Associated symptoms: no fever and no shortness of breath    patient presents with chest pain abdominal pain nausea and vomiting. Began today. States everything needs his neck. No diarrhea. Has some upper abdominal pain. No fevers. Pain is dull and constant. This is his 20th visit to the ER in the last 6 months. Patient states he has nausea medicine at home that is also disolves in his mouth but states it didn't help.   Past Medical History  Diagnosis Date  . Depression   . Seizures ([place])   . Diabetes mellitus without complication ([place])    History reviewed. No pertinent past surgical history. Family History  Problem Relation Age of Onset  . Heart disease Mother    Social History  Substance Use Topics  . Smoking status: Never Smoker   . Smokeless tobacco: Never Used  . Alcohol Use: No    Review of Systems  Constitutional: Positive for appetite change. Negative for fever.  Respiratory: Negative for shortness of breath.   Cardiovascular: Positive for chest pain.  Gastrointestinal: Positive for nausea, vomiting and abdominal pain. Negative for blood in stool.  Genitourinary: Negative for flank pain.  Skin: Negative for wound.  Neurological: Negative for headaches.      Allergies  Codeine and Penicillins  Home Medications   Prior to Admission medications   Medication Sig Start Date End Date Taking? Authorizing Provider  divalproex (DEPAKOTE ER) 250 MG 24 hr tablet Take 250 mg by mouth at bedtime. 03/03/15  Yes  Historical Provider, MD  ibuprofen (ADVIL,MOTRIN) 400 MG tablet Take 1 tablet (400 mg total) by mouth every 6 (six) hours as needed. Patient taking differently: Take 400 mg by mouth every 6 (six) hours as needed for moderate pain.  01/10/15  Yes Marella Chimes, PA-C  lamoTRIgine (LAMICTAL) 100 MG tablet Take 2 tablets (200 mg total) by mouth daily. Patient taking differently: Take 200 mg by mouth at bedtime.  03/11/15  Yes Cameron Sprang, MD  ondansetron (ZOFRAN ODT) 4 MG disintegrating tablet Take 1 tablet (4 mg total) by mouth every 8 (eight) hours as needed for nausea or vomiting. 02/15/15  Yes Anderson Malta Piepenbrink, PA-C  promethazine (PHENERGAN) 25 MG tablet Take 1 tablet (25 mg total) by mouth every 6 (six) hours as needed for nausea. 04/11/15   Davonna Belling, MD   BP 112/67 mmHg  Pulse 48  Temp(Src) 98.7 F (37.1 C) (Oral)  Resp 16  SpO2 96% Physical Exam  Constitutional: He is oriented to person, place, and time. He appears well-developed and well-nourished.  HENT:  Head: Normocephalic and atraumatic.  Eyes: Pupils are equal, round, and reactive to light.  Neck: Normal range of motion.  Cardiovascular: Normal rate, regular rhythm and normal heart sounds.   No murmur heard. Pulmonary/Chest: Effort normal and breath sounds normal.  Abdominal: Soft. Bowel sounds are normal. He exhibits no distension and no mass. There is tenderness. There is no rebound and no guarding.  Mild epigastric tenderness without rebound or guarding.  Musculoskeletal: Normal range of motion.  He exhibits no edema.  Neurological: He is alert and oriented to person, place, and time. No cranial nerve deficit.  Skin: Skin is warm and dry.  Psychiatric:  Patient has a somewhat strange affect  Nursing note and vitals reviewed.   ED Course  Procedures (including critical care time) Labs Review Labs Reviewed  BASIC METABOLIC PANEL  CBC  TROPONIN I  LIPASE, BLOOD    Imaging Review Dg Chest 2  View  04/11/2015  CLINICAL DATA:  Chest pain for the past 2 hours EXAM: CHEST  2 VIEW COMPARISON:  Chest x-rays of January 06, 2015 and April 05, 2015. FINDINGS: The lungs are well-expanded and clear. There is no pneumothorax, pneumomediastinum, or pleural effusion. There is no interstitial nor alveolar infiltrate. The heart and pulmonary vascularity are normal. The mediastinum is normal in width in width. There is gentle dextrocurvature centered in the mid thoracic spine. IMPRESSION: There is no active cardiopulmonary disease. Electronically Signed   By: David  Martinique M.D.   On: 04/11/2015 16:39   I have personally reviewed and evaluated these images and lab results as part of my medical decision-making.   EKG Interpretation   Date/Time:  Friday April 11 2015 15:24:33 EST Ventricular Rate:  55 PR Interval:  120 QRS Duration: 116 QT Interval:  394 QTC Calculation: 376 R Axis:   -6 Text Interpretation:  Sinus bradycardia Left ventricular hypertrophy with  QRS widening Abnormal ECG No significant change since last tracing  Confirmed by Alvino Chapel  MD, Ovid Curd [phone]) on 04/11/2015 5:43:51 PM      MDM   Final diagnoses:  Abdominal pain, unspecified abdominal location    Patient with abdominal pain. Reportedly had nausea vomiting. Tolerated orals here. Will discharge home with some Phenergan. Doubt cardiac or severe intra-abdominal cause. Doubt drug toxicity for his antiseizure medicines since he is also time been noncompliant with it.    Davonna Belling, MD 04/11/15 [phone]

## 2015-04-11 NOTE — Discharge Instructions (Signed)

## 2015-04-11 NOTE — ED Notes (Signed)
Pt tolerating fluids and [place] sandiwch.

## 2015-04-11 NOTE — ED Notes (Signed)
The pt is c/o mid-chest and abd pain for 45 minutes with n and v

## 2015-04-29 ENCOUNTER — Encounter: Payer: Self-pay | Admitting: Internal Medicine

## 2015-04-29 ENCOUNTER — Ambulatory Visit: Payer: Medicaid Other | Attending: Internal Medicine | Admitting: Internal Medicine

## 2015-04-29 VITALS — BP 124/77 | HR 73 | Temp 98.2°F | Resp 18 | Ht 72.0 in | Wt 160.0 lb

## 2015-04-29 DIAGNOSIS — R1084 Generalized abdominal pain: Secondary | ICD-10-CM | POA: Diagnosis present

## 2015-04-29 DIAGNOSIS — F319 Bipolar disorder, unspecified: Secondary | ICD-10-CM | POA: Insufficient documentation

## 2015-04-29 DIAGNOSIS — Z79899 Other long term (current) drug therapy: Secondary | ICD-10-CM | POA: Insufficient documentation

## 2015-04-29 DIAGNOSIS — Z885 Allergy status to narcotic agent status: Secondary | ICD-10-CM | POA: Insufficient documentation

## 2015-04-29 DIAGNOSIS — R42 Dizziness and giddiness: Secondary | ICD-10-CM | POA: Diagnosis not present

## 2015-04-29 DIAGNOSIS — M546 Pain in thoracic spine: Secondary | ICD-10-CM

## 2015-04-29 DIAGNOSIS — Z880 Allergy status to penicillin: Secondary | ICD-10-CM | POA: Insufficient documentation

## 2015-04-29 DIAGNOSIS — Z9102 Food additives allergy status: Secondary | ICD-10-CM | POA: Insufficient documentation

## 2015-04-29 MED ORDER — TRAMADOL HCL 50 MG PO TABS: 50.0000 mg | ORAL_TABLET | Freq: Two times a day (BID) | ORAL | Status: DC | PRN

## 2015-04-29 NOTE — Progress Notes (Signed)
Patient ID: Franklin [name], male   DOB: 1980-11-05, 34 y.o.   MRN: [id number]  CC: f/u  HPI: Franklin [name] is a 34 y.o. male here today for a follow up visit.  Patient has past medical history of depression, seizure disorder, and bipolar disorder. Patient reports that he has been having some dizziness and lightheadedness for the past 3 days. He notes that last week he passed out but no one witnessed the event. Dizziness is intermittent. He admits to taking his sister's Percocet on the day he had dizziness to help with back pain. Patient also complains of abdominal pains from his Lamictal and Depakote. Pain is described as a ache. Patient reports that since his Lamictal dose was increased he has been having abdominal pain shortly after taking his medications at night. He takes both medications at the same time which causes him to have nausea and vomiting. Review of EMR reveals that patient has been to the ER 20 times in the last 6 months for various complaints but no real pathology has been found to his complaints. He was last seen 2 weeks ago in the ER for abdominal pain and was given Phenergen for nausea which has provided some relief.  He has constant back pain described as a "hammer hitting his back". Pain is locater in the thoracic spine and he has tried tylenol and ibuprofen for pain. He rates the pain as a level 9 but he is smiling and appears comfortable on exam table.  Patient reports that he has not been on any bipolar medication in several months because he does not feel like he need it.  Allergies  Allergen Reactions  . Coconut Flavor Nausea And Vomiting  . Codeine Nausea And Vomiting  . Penicillins Nausea And Vomiting   Past Medical History  Diagnosis Date  . Depression   . Seizures ([place])    Current Outpatient Prescriptions on File Prior to Visit  Medication Sig Dispense Refill  . divalproex (DEPAKOTE ER) 250 MG 24 hr tablet Take 250 mg by mouth at bedtime.  6  . ibuprofen  (ADVIL,MOTRIN) 400 MG tablet Take 1 tablet (400 mg total) by mouth every 6 (six) hours as needed. (Patient taking differently: Take 400 mg by mouth every 6 (six) hours as needed for moderate pain. ) 30 tablet 0  . lamoTRIgine (LAMICTAL) 100 MG tablet Take 2 tablets (200 mg total) by mouth daily. (Patient taking differently: Take 200 mg by mouth at bedtime. ) 60 tablet 6  . promethazine (PHENERGAN) 25 MG tablet Take 1 tablet (25 mg total) by mouth every 6 (six) hours as needed for nausea. (Patient not taking: Reported on 04/29/2015) 10 tablet 0   No current facility-administered medications on file prior to visit.   Family History  Problem Relation Age of Onset  . Heart disease Mother    Social History   Social History  . Marital Status: Single    Spouse Name: N/A  . Number of Children: N/A  . Years of Education: N/A   Occupational History  . Not on file.   Social History Main Topics  . Smoking status: Never Smoker   . Smokeless tobacco: Never Used  . Alcohol Use: No  . Drug Use: No  . Sexual Activity: Not Currently   Other Topics Concern  . Not on file   Social History Narrative    Review of Systems: Other than what is stated in HPI, all other systems are negative.   Objective:   Filed  Vitals:   04/29/15 1519  BP: 124/77  Pulse: 73  Temp: 98.2 F (36.8 C)  Resp: 18    Physical Exam  Constitutional: He is oriented to person, place, and time.  Cardiovascular: Normal rate, regular rhythm and normal heart sounds.   Pulmonary/Chest: Effort normal and breath sounds normal.  Abdominal: Soft. Bowel sounds are normal. There is no tenderness.  Musculoskeletal: He exhibits no tenderness.  Neurological: He is alert and oriented to person, place, and time.     Lab Results  Component Value Date   WBC 5.6 04/11/2015   HGB 15.4 04/11/2015   HCT 44.3 04/11/2015   MCV 86.4 04/11/2015   PLT 191 04/11/2015   Lab Results  Component Value Date   CREATININE 1.05  04/11/2015   BUN 9 04/11/2015   NA 140 04/11/2015   K 3.6 04/11/2015   CL 106 04/11/2015   CO2 25 04/11/2015    Lab Results  Component Value Date   HGBA1C 5.4 10/14/2014   Lipid Panel     Component Value Date/Time   CHOL 168 10/14/2014 1445   TRIG 53 10/14/2014 1445   HDL 44 10/14/2014 1445   CHOLHDL 3.8 10/14/2014 1445   VLDL 11 10/14/2014 1445   LDLCALC 113* 10/14/2014 1445       Assessment and plan:   Alicia was seen today for follow-up.  Diagnoses and all orders for this visit:  Generalized abdominal pain I have asked patient to split medication up by 2-3 hours at night to help with abdominal discomfort and nausea. I believe discomfort is related to taking medications close together  Midline thoracic back pain -     traMADol (ULTRAM) 50 MG tablet; Take 1 tablet (50 mg total) by mouth every 12 (twelve) hours as needed. Will only give patient a few pills to help with pain.   Dizziness Dizziness related to tasking Percocet. I have strongly advised against taking medication not prescribed to him and the serious dangers related with it. Patient verbalized understanding.  Bipolar affective disorder, most recent episode unspecified type, remission status unspecified I have highly encouraged patient to make follow up appointment with [facility]. I believe many of his symptoms and ER overuse will be controlled once he is on the proper medication. Patient still verbalizes that he does not feel he needs the medication but he will think about making appointment with psychiatry.   Return if symptoms worsen or fail to improve.        Franklin [name], [facility] [phone] 04/29/2015, 3:37 PM

## 2015-04-29 NOTE — Progress Notes (Signed)
Patient here for check up. Patient reports feeling dizzy and light-headed for 3 days. Patient was walking, got light-headed and "fell out" 3 days before Thanksgiving. Medication, depakote and lamictal, makes patient sick to stomach and patient reports throwing up and sometimes has blood in throw up. Patient could not sleep due to stomach pain last night after taking medication. Patient reports back pain, at level 9, described as sharp, numb, "feels like somebody is taking a hammer and hitting my back right now".  Constant back pain has been going on for 2 weeks.

## 2015-04-29 NOTE — Patient Instructions (Signed)
Split seizure medications up by 2 hours apart to help with abdominal discomfort and nausea.  AI have given you a short course of Tramadol to help with back pain

## 2015-05-10 ENCOUNTER — Emergency Department (HOSPITAL_COMMUNITY)
Admission: EM | Admit: 2015-05-10 | Discharge: 2015-05-10 | Disposition: A | Discharge status: Home / Self Care | Payer: Medicaid Other | Attending: Emergency Medicine | Admitting: Emergency Medicine

## 2015-05-10 ENCOUNTER — Encounter (HOSPITAL_COMMUNITY): Payer: Self-pay | Admitting: *Deleted

## 2015-05-10 DIAGNOSIS — R569 Unspecified convulsions: Secondary | ICD-10-CM | POA: Insufficient documentation

## 2015-05-10 DIAGNOSIS — Y92009 Unspecified place in unspecified non-institutional (private) residence as the place of occurrence of the external cause: Secondary | ICD-10-CM | POA: Insufficient documentation

## 2015-05-10 DIAGNOSIS — Y9389 Activity, other specified: Secondary | ICD-10-CM | POA: Diagnosis not present

## 2015-05-10 DIAGNOSIS — Z79899 Other long term (current) drug therapy: Secondary | ICD-10-CM | POA: Diagnosis not present

## 2015-05-10 DIAGNOSIS — Z880 Allergy status to penicillin: Secondary | ICD-10-CM | POA: Insufficient documentation

## 2015-05-10 DIAGNOSIS — S40861A Insect bite (nonvenomous) of right upper arm, initial encounter: Secondary | ICD-10-CM | POA: Diagnosis not present

## 2015-05-10 DIAGNOSIS — W57XXXA Bitten or stung by nonvenomous insect and other nonvenomous arthropods, initial encounter: Secondary | ICD-10-CM | POA: Insufficient documentation

## 2015-05-10 DIAGNOSIS — G40309 Generalized idiopathic epilepsy and epileptic syndromes, not intractable, without status epilepticus: Secondary | ICD-10-CM

## 2015-05-10 DIAGNOSIS — Y998 Other external cause status: Secondary | ICD-10-CM | POA: Diagnosis not present

## 2015-05-10 DIAGNOSIS — F329 Major depressive disorder, single episode, unspecified: Secondary | ICD-10-CM | POA: Insufficient documentation

## 2015-05-10 DIAGNOSIS — Z9119 Patient's noncompliance with other medical treatment and regimen: Secondary | ICD-10-CM | POA: Insufficient documentation

## 2015-05-10 DIAGNOSIS — Z9114 Patient's other noncompliance with medication regimen: Secondary | ICD-10-CM

## 2015-05-10 LAB — I-STAT CHEM 8, ED
BUN: 10 mg/dL (ref 6–20)
CREATININE: 0.8 mg/dL (ref 0.61–1.24)
Calcium, Ion: 1.21 mmol/L (ref 1.12–1.23)
Chloride: 101 mmol/L (ref 101–111)
Glucose, Bld: 103 mg/dL — ABNORMAL HIGH (ref 65–99)
HEMATOCRIT: 48 % (ref 39.0–52.0)
HEMOGLOBIN: 16.3 g/dL (ref 13.0–17.0)
POTASSIUM: 3.9 mmol/L (ref 3.5–5.1)
SODIUM: 140 mmol/L (ref 135–145)
TCO2: 29 mmol/L (ref 0–100)

## 2015-05-10 LAB — VALPROIC ACID LEVEL: Valproic Acid Lvl: <10 ug/mL — ABNORMAL LOW (ref 50.0–100.0)

## 2015-05-10 MED ORDER — DIVALPROEX SODIUM 250 MG PO DR TAB
1000.0000 mg | DELAYED_RELEASE_TABLET | Freq: Once | ORAL | Status: AC
  Administered 2015-05-10: 1000 mg via ORAL
  Filled 2015-05-10: qty 4

## 2015-05-10 MED ORDER — DIVALPROEX SODIUM ER 250 MG PO TB24: 250.0000 mg | ORAL_TABLET | Freq: Every day | ORAL | Status: DC

## 2015-05-10 MED ORDER — PERMETHRIN 5 % EX CREA: TOPICAL_CREAM | CUTANEOUS | Status: DC

## 2015-05-10 MED ORDER — LAMOTRIGINE 100 MG PO TABS: 200.0000 mg | ORAL_TABLET | Freq: Every day | ORAL | Status: DC

## 2015-05-10 NOTE — ED Notes (Signed)
The pt was given  Medication.  He requests food.  Given  Pt being discharged following

## 2015-05-10 NOTE — ED Notes (Signed)
The  Pt  Reports that he had 2 seizures tonight.  Hx seizures.  He reports that he lives in a boarding house and he does not have heat and he is not staying there in the cold.  He also reports that he thinks he has bed bugs.  He takes his seizure med sometimes.  He was texting in his cell phone as i triaged him

## 2015-05-10 NOTE — Discharge Instructions (Signed)
Epilepsy °Epilepsy is a disorder in which a person has repeated seizures over time. A seizure is a release of abnormal electrical activity in the brain. Seizures can cause a change in attention, behavior, or the ability to remain awake and alert (altered mental status). Seizures often involve uncontrollable shaking (convulsions).  °Most people with epilepsy lead normal lives. However, people with epilepsy are at an increased risk of falls, accidents, and injuries. Therefore, it is important to begin treatment right away. °CAUSES  °Epilepsy has many possible causes. Anything that disturbs the normal pattern of brain cell activity can lead to seizures. This may include:  °· Head injury. °· Birth trauma. °· High fever as a child. °· Stroke. °· Bleeding into or around the brain. °· Certain drugs. °· Prolonged low oxygen, such as what occurs after CPR efforts. °· Abnormal brain development. °· Certain illnesses, such as meningitis, encephalitis (brain infection), malaria, and other infections. °· An imbalance of nerve signaling chemicals (neurotransmitters).   °SIGNS AND SYMPTOMS  °The symptoms of a seizure can vary greatly from one person to another. Right before a seizure, you may have a warning (aura) that a seizure is about to occur. An aura may include the following symptoms: °· Fear or anxiety. °· Nausea. °· Feeling like the room is spinning (vertigo). °· Vision changes, such as seeing flashing lights or spots. °Common symptoms during a seizure include: °· Abnormal sensations, such as an abnormal smell or a bitter taste in the mouth.   °· Sudden, general body stiffness.   °· Convulsions that involve rhythmic jerking of the face, arm, or leg on one or both sides.   °· Sudden change in consciousness.   °¨ Appearing to be awake but not responding.   °¨ Appearing to be asleep but cannot be awakened.   °· Grimacing, chewing, lip smacking, drooling, tongue biting, or loss of bowel or bladder control. °After a seizure,  you may feel sleepy for a while.  °DIAGNOSIS  °Your health care provider will ask about your symptoms and take a medical history. Descriptions from any witnesses to your seizures will be very helpful in the diagnosis. A physical exam, including a detailed neurological exam, is necessary. Various tests may be done, such as:  °· An electroencephalogram (EEG). This is a painless test of your brain waves. In this test, a diagram is created of your brain waves. These diagrams can be interpreted by a specialist. °· An MRI of the brain.   °· A CT scan of the brain.   °· A spinal tap (lumbar puncture, LP). °· Blood tests to check for signs of infection or abnormal blood chemistry. °TREATMENT  °There is no cure for epilepsy, but it is generally treatable. Once epilepsy is diagnosed, it is important to begin treatment as soon as possible. For most people with epilepsy, seizures can be controlled with medicines. The following may also be used: °· A pacemaker for the brain (vagus nerve stimulator) can be used for people with seizures that are not well controlled by medicine. °· Surgery on the brain. °For some people, epilepsy eventually goes away. °HOME CARE INSTRUCTIONS  °· Follow your health care provider's recommendations on driving and safety in normal activities. °· Get enough rest. Lack of sleep can cause seizures. °· Only take over-the-counter or prescription medicines as directed by your health care provider. Take any prescribed medicine exactly as directed. °· Avoid any known triggers of your seizures. °· Keep a seizure diary. Record what you recall about any seizure, especially any possible trigger.   °· Make   sure the people you live and work with know that you are prone to seizures. They should receive instructions on how to help you. In general, a witness to a seizure should:   Cushion your head and body.   Turn you on your side.   Avoid unnecessarily restraining you.   Not place anything inside your  mouth.   Call for emergency medical help if there is any question about what has occurred.   Follow up with your health care provider as directed. You may need regular blood tests to monitor the levels of your medicine.  SEEK MEDICAL CARE IF:   You develop signs of infection or other illness. This might increase the risk of a seizure.   You seem to be having more frequent seizures.   Your seizure pattern is changing.  SEEK IMMEDIATE MEDICAL CARE IF:   You have a seizure that does not stop after a few moments.   You have a seizure that causes any difficulty in breathing.   You have a seizure that results in a very severe headache.   You have a seizure that leaves you with the inability to speak or use a part of your body.    This information is not intended to replace advice given to you by your health care provider. Make sure you discuss any questions you have with your health care provider.   Document Released: 05/17/2005 Document Revised: 03/07/2013 Document Reviewed: 12/27/2012 Elsevier Interactive Patient Education 2016 [place] are tiny bugs that live in and around beds. They stay hidden during the day, and they come out at night and bite. Bedbugs need blood to live and grow. WHERE ARE BEDBUGS FOUND? Bedbugs can be found anywhere, whether a place is clean or dirty. They are most often found in places where many people come and go, such as hotels, shelters, dorms, and health care settings. It is also common for them to be found in homes where there are many birds or bats nearby. WHAT ARE [place]? A bedbug bite leaves a small red bump with a darker red dot in the middle. The bump may appear soon after a person is bitten or a day or more later. Bedbug bites usually do not hurt, but they may itch. Most people do not need treatment for bedbug bites. The bumps usually go away on their own in a few days. HOW DO I CHECK FOR BEDBUGS? Bedbugs are  reddish-brown, oval, and flat. They range in size from 1 mm to 7 mm and they cannot fly. Look for bedbugs in these places:  On mattresses, bed frames, headboards, and box springs.  On drapes and curtains in bedrooms.  Under carpeting in bedrooms.  Behind electrical outlets.  Behind any wallpaper that is peeling.  Inside luggage. Also look for black or red spots or stains on or near the bed. Stains can come from bedbugs that have been crushed or from bedbug waste. WHAT SHOULD I DO IF I FIND BEDBUGS? When Traveling If you find bedbugs while traveling, check all of your possessions carefully before you bring them into your home. Consider throwing away anything that has bedbugs on it. At Home If you find bedbugs at home, your bedroom may need to be treated by a pest control expert. You may also need to throw away mattresses or luggage. To help keep bedbugs from coming back, consider taking these actions:  Put a plastic cover over your mattress.  Wash your clothes  and bedding in water that is hotter than 120F (48.9C) and dry them on a hot setting. Bedbugs are killed by high temperatures.  Vacuum often around the bed and in all of the cracks and crevices where the bugs might hide.  Carefully check all used furniture, bedding, or clothes that you bring into your home.  Eliminate bird nests and bat roosts that are near your home. In Your Bed If you find bedbugs in your bed, consider wearing pajamas that have long sleeves and pant legs. Bedbugs usually bite areas of the skin that are not covered.   This information is not intended to replace advice given to you by your health care provider. Make sure you discuss any questions you have with your health care provider.   Document Released: 06/19/2010 Document Revised: 10/01/2014 Document Reviewed: 05/13/2014 Elsevier Interactive Patient Education [employer].

## 2015-05-10 NOTE — ED Provider Notes (Signed)
By signing my name below, I, Evelene Croon, attest that this documentation has been prepared under the direction and in the presence of [place], DO . Electronically Signed: Evelene Croon, Scribe. 05/10/2015. 3:32 AM.   TIME SEEN: 3:27 AM  CHIEF COMPLAINT: Seizure  HPI:  HPI Comments:  Franklin [name] is a 34 y.o. male with a history of seizure, who presents to the Emergency Department complaining of seizure.  Pt states he's had 2 seizures in the last day. He is currently on Depakote and Lamictal, states he may have missed a few doses. Pt denies fever, nausea, vomiting, diarrhea, cough, illicit drug and alcohol use. No head injury. He also complains of pruritus and a small "bump" to his RUE; states he has seen bed bugs. Pt is currently renting a room in a boarding house and states he is seen bed bugs on the mattress. No alleviating factors noted. No other rash.  ROS: See HPI Constitutional: no fever  Eyes: no drainage  ENT: no runny nose   Cardiovascular:  no chest pain  Resp: no SOB  GI: no vomiting GU: no dysuria Integumentary: no rash  Allergy: no hives  Musculoskeletal: no leg swelling  Neurological: no slurred speech ROS otherwise negative  PAST MEDICAL HISTORY/PAST SURGICAL HISTORY:  Past Medical History  Diagnosis Date  . Depression   . Seizures ([place])     MEDICATIONS:  Prior to Admission medications   Medication Sig Start Date End Date Taking? Authorizing Provider  divalproex (DEPAKOTE ER) 250 MG 24 hr tablet Take 250 mg by mouth at bedtime. 03/03/15   Historical Provider, MD  ibuprofen (ADVIL,MOTRIN) 400 MG tablet Take 1 tablet (400 mg total) by mouth every 6 (six) hours as needed. Patient taking differently: Take 400 mg by mouth every 6 (six) hours as needed for moderate pain.  01/10/15   Marella Chimes, PA-C  lamoTRIgine (LAMICTAL) 100 MG tablet Take 2 tablets (200 mg total) by mouth daily. Patient taking differently: Take 200 mg by mouth at bedtime.   03/11/15   Cameron Sprang, MD  promethazine (PHENERGAN) 25 MG tablet Take 1 tablet (25 mg total) by mouth every 6 (six) hours as needed for nausea. Patient not taking: Reported on 04/29/2015 04/11/15   Davonna Belling, MD  traMADol (ULTRAM) 50 MG tablet Take 1 tablet (50 mg total) by mouth every 12 (twelve) hours as needed. 04/29/15   Lance Bosch, NP    ALLERGIES:  Allergies  Allergen Reactions  . Coconut Flavor Nausea And Vomiting  . Codeine Nausea And Vomiting  . Penicillins Nausea And Vomiting    SOCIAL HISTORY:  Social History  Substance Use Topics  . Smoking status: Never Smoker   . Smokeless tobacco: Never Used  . Alcohol Use: No    FAMILY HISTORY: Family History  Problem Relation Age of Onset  . Heart disease Mother     EXAM: BP 137/81 mmHg  Pulse 57  Temp(Src) 98.3 F (36.8 C) (Oral)  Resp 16  SpO2 100% CONSTITUTIONAL: Alert and oriented and responds appropriately to questions. Well-appearing; well-nourished HEAD: Normocephalic EYES: Conjunctivae clear, PERRL ENT: normal nose; no rhinorrhea; moist mucous membranes; pharynx without lesions noted NECK: Supple, no meningismus, no LAD  CARD: RRR; S1 and S2 appreciated; no murmurs, no clicks, no rubs, no gallops RESP: Normal chest excursion without splinting or tachypnea; breath sounds clear and equal bilaterally; no wheezes, no rhonchi, no rales, no hypoxia or respiratory distress, speaking full sentences ABD/GI: Normal bowel sounds; non-distended;  soft, non-tender, no rebound, no guarding, no peritoneal signs BACK:  The back appears normal and is non-tender to palpation, there is no CVA tenderness EXT: Normal ROM in all joints; non-tender to palpation; no edema; normal capillary refill; no cyanosis, no calf tenderness or swelling    SKIN: Normal color for age and race; warm. No rash. NEURO: Moves all extremities equally, sensation to light touch intact diffusely, cranial nerves II through XII intact, normal  gait PSYCH: The patient's mood and manner are appropriate. Grooming and personal hygiene are appropriate.  MEDICAL DECISION MAKING: Patient here with 2 seizures. He has a history of medical noncompliance. He is at his neurologic baseline. No infectious symptoms. No history or signs of trauma on exam. No sign of rash or bed bugs on exam. He states he is also concerned about possible scabies. We'll discharge with permethrin. Labs unremarkable other than a subtherapeutic Depakote level. Discussed with pharmacist who recommends giving 1000 mg of oral Depakote now. We'll refill his Depakote and Lamictal. Have recommend he follow-up with his outpatient providers. Discussed return precautions. He verbalizes understanding and is comfortable with this plan.  I personally performed the services described in this documentation, which was scribed in my presence. The recorded information has been reviewed and is accurate.    [place], DO 05/10/15 [phone]

## 2015-05-20 ENCOUNTER — Encounter (HOSPITAL_COMMUNITY): Payer: Self-pay | Admitting: *Deleted

## 2015-05-20 ENCOUNTER — Emergency Department (HOSPITAL_COMMUNITY)
Admission: EM | Admit: 2015-05-20 | Discharge: 2015-05-21 | Disposition: A | Discharge status: Home / Self Care | Payer: Medicaid Other | Attending: Emergency Medicine | Admitting: Emergency Medicine

## 2015-05-20 DIAGNOSIS — R1033 Periumbilical pain: Secondary | ICD-10-CM | POA: Insufficient documentation

## 2015-05-20 DIAGNOSIS — R109 Unspecified abdominal pain: Secondary | ICD-10-CM | POA: Diagnosis present

## 2015-05-20 DIAGNOSIS — Z79899 Other long term (current) drug therapy: Secondary | ICD-10-CM | POA: Diagnosis not present

## 2015-05-20 DIAGNOSIS — R1110 Vomiting, unspecified: Secondary | ICD-10-CM | POA: Insufficient documentation

## 2015-05-20 DIAGNOSIS — Z880 Allergy status to penicillin: Secondary | ICD-10-CM | POA: Insufficient documentation

## 2015-05-20 DIAGNOSIS — F329 Major depressive disorder, single episode, unspecified: Secondary | ICD-10-CM | POA: Diagnosis not present

## 2015-05-20 DIAGNOSIS — R42 Dizziness and giddiness: Secondary | ICD-10-CM | POA: Insufficient documentation

## 2015-05-20 LAB — CBC
HCT: 42.6 % (ref 39.0–52.0)
HEMOGLOBIN: 15 g/dL (ref 13.0–17.0)
MCH: 30.5 pg (ref 26.0–34.0)
MCHC: 35.2 g/dL (ref 30.0–36.0)
MCV: 86.6 fL (ref 78.0–100.0)
PLATELETS: 195 10*3/uL (ref 150–400)
RBC: 4.92 MIL/uL (ref 4.22–5.81)
RDW: 12.6 % (ref 11.5–15.5)
WBC: 7.3 10*3/uL (ref 4.0–10.5)

## 2015-05-20 NOTE — ED Notes (Signed)
Pt states that he began having abd pain 15 min ago; pt states that it was sudden onset; pt c/o N/V; pt states that there was blood mixed in the vomit; pt states that he is trying to get on his Prozac instead of the other medicine that he is on; pt states that the "other medicine" causing his stomach to hurt; pt c/o feeling dizzy as well; pt talking on his cell phone during triage

## 2015-05-21 ENCOUNTER — Telehealth: Payer: Self-pay | Admitting: Internal Medicine

## 2015-05-21 LAB — COMPREHENSIVE METABOLIC PANEL
ALK PHOS: 57 U/L (ref 38–126)
ALT: 12 U/L — ABNORMAL LOW (ref 17–63)
ANION GAP: 9 (ref 5–15)
AST: 19 U/L (ref 15–41)
Albumin: 4.6 g/dL (ref 3.5–5.0)
BUN: 13 mg/dL (ref 6–20)
CALCIUM: 9.8 mg/dL (ref 8.9–10.3)
CHLORIDE: 107 mmol/L (ref 101–111)
CO2: 26 mmol/L (ref 22–32)
Creatinine, Ser: 0.92 mg/dL (ref 0.61–1.24)
GFR calc non Af Amer: >60 mL/min (ref >60)
Glucose, Bld: 116 mg/dL — ABNORMAL HIGH (ref 65–99)
POTASSIUM: 3.6 mmol/L (ref 3.5–5.1)
SODIUM: 142 mmol/L (ref 135–145)
Total Bilirubin: 1.2 mg/dL (ref 0.3–1.2)
Total Protein: 7.6 g/dL (ref 6.5–8.1)

## 2015-05-21 LAB — LIPASE, BLOOD: LIPASE: 29 U/L (ref 11–51)

## 2015-05-21 LAB — URINALYSIS, ROUTINE W REFLEX MICROSCOPIC
Bilirubin Urine: NEGATIVE
Glucose, UA: NEGATIVE mg/dL
Hgb urine dipstick: NEGATIVE
KETONES UR: NEGATIVE mg/dL
LEUKOCYTES UA: NEGATIVE
NITRITE: NEGATIVE
Protein, ur: NEGATIVE mg/dL
Specific Gravity, Urine: 1.018 (ref 1.005–1.030)
pH: 7 (ref 5.0–8.0)

## 2015-05-21 NOTE — Discharge Instructions (Signed)
Return here as needed.  Follow-up with your primary care doctor °

## 2015-05-21 NOTE — ED Provider Notes (Signed)
CSN: [id number]     Arrival date & time 05/20/15  2303 History   First MD Initiated Contact with Patient 05/20/15 2356     Chief Complaint  Patient presents with  . Abdominal Pain     (Consider location/radiation/quality/duration/timing/severity/associated sxs/prior Treatment) HPI Patient presents to the emergency department with abdominal pain that started a few minutes prior to arrival.  Patient states that he is trying to get on Prozac and said taking Depakote.  He states that in the nurse came out to his house this evening to have a time.  She signed and that is when he started feeling bad.  He states all he wants to do is to be started back on his Prozac.  Patient states that he has not had any dysuria, incontinence, back pain, neck pain, fever, chest pain, shortness of breath, weakness, dizziness, headache, blurred vision, or syncope.  The patient states that he did have one episode of vomiting and did felt little dizzy at this time.  Patient states he is feeling better at this time Past Medical History  Diagnosis Date  . Depression   . Seizures ([place])    History reviewed. No pertinent past surgical history. Family History  Problem Relation Age of Onset  . Heart disease Mother    Social History  Substance Use Topics  . Smoking status: Never Smoker   . Smokeless tobacco: Never Used  . Alcohol Use: No    Review of Systems  All other systems negative except as documented in the HPI. All pertinent positives and negatives as reviewed in the HPI.  Allergies  Coconut flavor; Codeine; and Penicillins  Home Medications   Prior to Admission medications   Medication Sig Start Date End Date Taking? Authorizing Provider  divalproex (DEPAKOTE ER) 250 MG 24 hr tablet Take 1 tablet (250 mg total) by mouth at bedtime. 05/10/15  Yes Kristen N Ward, DO  lamoTRIgine (LAMICTAL) 100 MG tablet Take 2 tablets (200 mg total) by mouth at bedtime. 05/10/15  Yes Kristen N Ward, DO  ibuprofen  (ADVIL,MOTRIN) 400 MG tablet Take 1 tablet (400 mg total) by mouth every 6 (six) hours as needed. Patient not taking: Reported on 05/20/2015 01/10/15   Marella Chimes, PA-C  permethrin (ELIMITE) 5 % cream Apply to affected area once 05/10/15   Kristen N Ward, DO   BP 138/93 mmHg  Pulse 75  Temp(Src) 98.1 F (36.7 C) (Oral)  Resp 20  Ht 6' (1.829 m)  Wt 68.04 kg  BMI 20.34 kg/m2  SpO2 99% Physical Exam  Constitutional: He is oriented to person, place, and time. He appears well-developed and well-nourished. No distress.  HENT:  Head: Normocephalic and atraumatic.  Mouth/Throat: Oropharynx is clear and moist.  Eyes: Pupils are equal, round, and reactive to light.  Neck: Normal range of motion. Neck supple.  Cardiovascular: Normal rate, regular rhythm and normal heart sounds.  Exam reveals no gallop and no friction rub.   No murmur heard. Pulmonary/Chest: Effort normal and breath sounds normal. No respiratory distress. He has no wheezes.  Abdominal: Soft. Normal appearance and bowel sounds are normal. He exhibits no distension. There is tenderness.    Neurological: He is alert and oriented to person, place, and time. He exhibits normal muscle tone. Coordination normal.  Skin: Skin is warm and dry. No rash noted. No erythema.  Psychiatric: He has a normal mood and affect. His behavior is normal.  Nursing note and vitals reviewed.   ED Course  Procedures (  including critical care time) Labs Review Labs Reviewed  COMPREHENSIVE METABOLIC PANEL - Abnormal; Notable for the following:    Glucose, Bld 116 (*)    ALT 12 (*)    All other components within normal limits  LIPASE, BLOOD  CBC  URINALYSIS, ROUTINE W REFLEX MICROSCOPIC (NOT AT [facility])    Imaging Review No results found. I have personally reviewed and evaluated these images and lab results as part of my medical decision-making.   Patient feels better at this time.  He states that he will follow-up with his primary care  doctor.  Patient is told to return here as needed.  Patient agrees the plan and all questions were answered on examination, the patient did not have an acute abdomen.  Dalia Heading, PA-C 05/21/15 [id number]  Rolland Porter, MD 05/21/15 [phone]

## 2015-05-21 NOTE — Telephone Encounter (Signed)
Pt. Called requesting a med refill on  Prozac. Pt. Stated he has not been taking this medication in a while and would like to get back on this medication. Please f/u with pt.

## 2015-05-21 NOTE — ED Notes (Signed)
Pt ambulating independently w/ steady gait on d/c in no acute distress, A&Ox4. D/c instructions reviewed w/ pt - pt denies any further questions or concerns at present.  

## 2015-05-23 ENCOUNTER — Telehealth: Payer: Self-pay

## 2015-05-23 NOTE — Telephone Encounter (Signed)
Tried to contact patient in reference to his refill request Patient not available Message left on voice mail to return our call

## 2015-05-23 NOTE — Telephone Encounter (Signed)
Patient returned phone call Patient is aware he will need to get in contact with his psychiatrist in order to get a refill on his prozac

## 2015-06-21 ENCOUNTER — Encounter (HOSPITAL_COMMUNITY): Payer: Self-pay | Admitting: Nurse Practitioner

## 2015-06-21 ENCOUNTER — Emergency Department (HOSPITAL_COMMUNITY)
Admission: EM | Admit: 2015-06-21 | Discharge: 2015-06-22 | Disposition: A | Discharge status: Home / Self Care | Payer: Medicaid Other | Attending: Emergency Medicine | Admitting: Emergency Medicine

## 2015-06-21 DIAGNOSIS — Z79899 Other long term (current) drug therapy: Secondary | ICD-10-CM | POA: Insufficient documentation

## 2015-06-21 DIAGNOSIS — F209 Schizophrenia, unspecified: Secondary | ICD-10-CM | POA: Insufficient documentation

## 2015-06-21 DIAGNOSIS — R569 Unspecified convulsions: Secondary | ICD-10-CM | POA: Insufficient documentation

## 2015-06-21 DIAGNOSIS — F319 Bipolar disorder, unspecified: Secondary | ICD-10-CM | POA: Diagnosis not present

## 2015-06-21 DIAGNOSIS — Z880 Allergy status to penicillin: Secondary | ICD-10-CM | POA: Diagnosis not present

## 2015-06-21 HISTORY — DX: Bipolar disorder, unspecified: F31.9

## 2015-06-21 HISTORY — DX: Schizophrenia, unspecified: F20.9

## 2015-06-21 NOTE — ED Notes (Signed)
Per EMS pt from home GF and patient were lying in bed and patient "wasnt acting right". Upon EMS arrival girlfriend stated patient was having seizures- EMS noted there was no seizure like activity. Patient showing attention-seeking behaviors. When EMS stated they were leaving scene patient requested transport to ER
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
## 2015-06-21 NOTE — ED Provider Notes (Signed)
CSN: [id number]     Arrival date & time 06/21/15  2259 History  By signing my name below, I, Franklin [name], attest that this documentation has been prepared under the direction and in the presence of Franklin Schmidt, MD . Electronically Signed: Dora [name], Scribe. 06/22/2015. 12:46 AM.      Chief Complaint  Patient presents with  . Seizures    The history is provided by the patient. No language interpreter was used.     HPI Comments:  Franklin [name] is a 35 y.o. male brought in by ambulance, who presents to the Emergency Department s/p witnessed seizure occurring today. Pt reports that he seized for approximately 15 minutes and it was witnessed by his girlfriend Franklin [name]. Per Lauro Regulus, pt complained of sudden onset difficulty breathing followed by ~ 30-40 minutes of staring into space. She states this is how his seizures typically present.  Pt reports that Franklin [name] called the paramedics. He has a significant PMHx of seizures and is supposed to take depakote, prozac, and lamictal. He reportedly just received depakote medication recently after not having it for an extended period of time. Pt denies taking his depakote medication today. He reports associated mild dizziness. Pt denies ETOH or drug abuse. He denies any other symptoms at this time.     Past Medical History  Diagnosis Date  . Depression   . Seizures ([place])   . Bipolar 1 disorder ([place])   . Schizophrenic disorder ([place])    History reviewed. No pertinent past surgical history. Family History  Problem Relation Age of Onset  . Heart disease Mother    Social History  Substance Use Topics  . Smoking status: Never Smoker   . Smokeless tobacco: Never Used  . Alcohol Use: No    Review of Systems   10 systems reviewed and all are negative for acute change except as noted in the HPI.   Allergies  Coconut flavor; Codeine; and Penicillins  Home Medications   Prior to Admission medications   Medication Sig Start Date End Date Taking?  Authorizing Provider  divalproex (DEPAKOTE ER) 500 MG 24 hr tablet Take 500 mg by mouth at bedtime.   Yes Historical Provider, MD  FLUoxetine (PROZAC) 20 MG capsule Take 20 mg by mouth daily.   Yes Historical Provider, MD  lamoTRIgine (LAMICTAL) 100 MG tablet Take 2 tablets (200 mg total) by mouth at bedtime. Patient taking differently: Take 100 mg by mouth 2 (two) times daily.  05/10/15  Yes Kristen N Ward, DO  divalproex (DEPAKOTE ER) 250 MG 24 hr tablet Take 1 tablet (250 mg total) by mouth at bedtime. 05/10/15   Kristen N Ward, DO  ibuprofen (ADVIL,MOTRIN) 400 MG tablet Take 1 tablet (400 mg total) by mouth every 6 (six) hours as needed. Patient not taking: Reported on 05/20/2015 01/10/15   Marella Chimes, PA-C  permethrin (ELIMITE) 5 % cream Apply to affected area once 05/10/15   Kristen N Ward, DO   BP 141/94 mmHg  Pulse 67  Temp(Src) 98.4 F (36.9 C) (Oral)  Resp 18  SpO2 98% Physical Exam  Constitutional: He is oriented to person, place, and time. He appears well-developed and well-nourished.  HENT:  Head: Normocephalic and atraumatic.  Eyes: EOM are normal. Pupils are equal, round, and reactive to light.  Neck: Normal range of motion.  Cardiovascular: Normal rate, regular rhythm, normal heart sounds and intact distal pulses.   Pulmonary/Chest: Effort normal and breath sounds normal. No respiratory distress.  Abdominal: Soft. He  exhibits no distension. There is no tenderness.  Musculoskeletal: Normal range of motion.  Bruising LUE  Neurological: He is alert and oriented to person, place, and time.  5/5 strength in major muscle groups of  bilateral upper and lower extremities. Speech normal. No facial asymetry.   Skin: Skin is warm and dry.  Psychiatric: He has a normal mood and affect. Judgment normal.  Nursing note and vitals reviewed.   ED Course  Procedures   DIAGNOSTIC STUDIES:  Oxygen Saturation is 98% on RA, normal by my interpretation.    COORDINATION OF  CARE:  11:03 PM Spoke with girlfriend who provided more history. Will order lab work.  Discussed treatment plan with pt at bedside and pt agreed to plan.  Labs Review Labs Reviewed  VALPROIC ACID LEVEL - Abnormal; Notable for the following:    Valproic Acid Lvl 29 (*)    All other components within normal limits  BASIC METABOLIC PANEL  CBC    Imaging Review No results found. I have personally reviewed and evaluated these images and lab results as part of my medical decision-making.   EKG Interpretation None      MDM   Final diagnoses:  None    Subtherapeutic Depakote level.  Patient will take his home Depakote.  Likely the cause of his seizure today.  Overall well-appearing.  Patient is returned to baseline.  Discharge home in good condition.  Outpatient neurology follow-up.  No indication for admission or additional testing at this time.  I personally performed the services described in this documentation, which was scribed in my presence. The recorded information has been reviewed and is accurate.       Franklin Schmidt, MD 06/22/15 [phone]

## 2015-06-22 LAB — CBC
HCT: 43.3 % (ref 39.0–52.0)
Hemoglobin: 14.9 g/dL (ref 13.0–17.0)
MCH: 30.3 pg (ref 26.0–34.0)
MCHC: 34.4 g/dL (ref 30.0–36.0)
MCV: 88 fL (ref 78.0–100.0)
PLATELETS: 181 10*3/uL (ref 150–400)
RBC: 4.92 MIL/uL (ref 4.22–5.81)
RDW: 12.8 % (ref 11.5–15.5)
WBC: 7.9 10*3/uL (ref 4.0–10.5)

## 2015-06-22 LAB — BASIC METABOLIC PANEL
Anion gap: 11 (ref 5–15)
BUN: 10 mg/dL (ref 6–20)
CALCIUM: 9.4 mg/dL (ref 8.9–10.3)
CHLORIDE: 104 mmol/L (ref 101–111)
CO2: 25 mmol/L (ref 22–32)
CREATININE: 0.88 mg/dL (ref 0.61–1.24)
GFR calc non Af Amer: >60 mL/min (ref >60)
Glucose, Bld: 94 mg/dL (ref 65–99)
Potassium: 3.7 mmol/L (ref 3.5–5.1)
Sodium: 140 mmol/L (ref 135–145)

## 2015-06-22 LAB — VALPROIC ACID LEVEL: VALPROIC ACID LVL: 29 ug/mL — AB (ref 50.0–100.0)

## 2015-06-22 MED ORDER — DIVALPROEX SODIUM 250 MG PO DR TAB
500.0000 mg | DELAYED_RELEASE_TABLET | Freq: Once | ORAL | Status: AC
  Administered 2015-06-22: 500 mg via ORAL
  Filled 2015-06-22: qty 2

## 2015-06-22 NOTE — Discharge Instructions (Signed)

## 2015-06-26 ENCOUNTER — Emergency Department (HOSPITAL_COMMUNITY): Payer: Medicaid Other

## 2015-06-26 ENCOUNTER — Emergency Department (HOSPITAL_COMMUNITY)
Admission: EM | Admit: 2015-06-26 | Discharge: 2015-06-26 | Disposition: A | Discharge status: Home / Self Care | Payer: Medicaid Other | Attending: Emergency Medicine | Admitting: Emergency Medicine

## 2015-06-26 DIAGNOSIS — Y9289 Other specified places as the place of occurrence of the external cause: Secondary | ICD-10-CM | POA: Diagnosis not present

## 2015-06-26 DIAGNOSIS — S199XXA Unspecified injury of neck, initial encounter: Secondary | ICD-10-CM | POA: Insufficient documentation

## 2015-06-26 DIAGNOSIS — M542 Cervicalgia: Secondary | ICD-10-CM

## 2015-06-26 DIAGNOSIS — F209 Schizophrenia, unspecified: Secondary | ICD-10-CM | POA: Diagnosis not present

## 2015-06-26 DIAGNOSIS — W19XXXA Unspecified fall, initial encounter: Secondary | ICD-10-CM | POA: Diagnosis not present

## 2015-06-26 DIAGNOSIS — Z79899 Other long term (current) drug therapy: Secondary | ICD-10-CM | POA: Diagnosis not present

## 2015-06-26 DIAGNOSIS — R569 Unspecified convulsions: Secondary | ICD-10-CM | POA: Insufficient documentation

## 2015-06-26 DIAGNOSIS — Z880 Allergy status to penicillin: Secondary | ICD-10-CM | POA: Insufficient documentation

## 2015-06-26 DIAGNOSIS — Y9301 Activity, walking, marching and hiking: Secondary | ICD-10-CM | POA: Diagnosis not present

## 2015-06-26 DIAGNOSIS — F319 Bipolar disorder, unspecified: Secondary | ICD-10-CM | POA: Insufficient documentation

## 2015-06-26 DIAGNOSIS — Y999 Unspecified external cause status: Secondary | ICD-10-CM | POA: Insufficient documentation

## 2015-06-26 LAB — VALPROIC ACID LEVEL: Valproic Acid Lvl: 10 ug/mL — ABNORMAL LOW (ref 50.0–100.0)

## 2015-06-26 MED ORDER — DIVALPROEX SODIUM 250 MG PO DR TAB
500.0000 mg | DELAYED_RELEASE_TABLET | Freq: Once | ORAL | Status: AC
  Administered 2015-06-26: 500 mg via ORAL
  Filled 2015-06-26: qty 2

## 2015-06-26 MED ORDER — METHOCARBAMOL 500 MG PO TABS: 500.0000 mg | ORAL_TABLET | Freq: Three times a day (TID) | ORAL | Status: DC | PRN

## 2015-06-26 MED ORDER — HYDROCODONE-ACETAMINOPHEN 5-325 MG PO TABS: 1.0000 | ORAL_TABLET | ORAL | Status: DC | PRN

## 2015-06-26 MED ORDER — ONDANSETRON 4 MG PO TBDP
4.0000 mg | ORAL_TABLET | Freq: Once | ORAL | Status: AC
  Administered 2015-06-26: 4 mg via ORAL
  Filled 2015-06-26: qty 1

## 2015-06-26 MED ORDER — HYDROCODONE-ACETAMINOPHEN 5-325 MG PO TABS
2.0000 | ORAL_TABLET | Freq: Once | ORAL | Status: AC
  Administered 2015-06-26: 2 via ORAL
  Filled 2015-06-26: qty 2

## 2015-06-26 MED ORDER — METHOCARBAMOL 500 MG PO TABS
1000.0000 mg | ORAL_TABLET | Freq: Once | ORAL | Status: AC
  Administered 2015-06-26: 1000 mg via ORAL
  Filled 2015-06-26: qty 2
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
## 2015-06-26 NOTE — Discharge Instructions (Signed)
Seizure, Adult °A seizure means there is unusual activity in the brain. A seizure can cause changes in attention or behavior. Seizures often cause shaking (convulsions). Seizures often last from 30 seconds to 2 minutes. °HOME CARE  °· If you are given medicines, take them exactly as told by your doctor. °· Keep all doctor visits as told. °· Do not swim or drive until your doctor says it is okay. °· Teach others what to do if you have a seizure. They should: °¨ Lay you on the ground. °¨ Put a cushion under your head. °¨ Loosen any tight clothing around your neck. °¨ Turn you on your side. °¨ Stay with you until you get better. °GET HELP RIGHT AWAY IF:  °· The seizure lasts longer than 2 to 5 minutes. °· The seizure is very bad. °· The person does not wake up after the seizure. °· The person's attention or behavior changes. °Drive the person to the emergency room or call your local emergency services (911 in [place] °MAKE SURE YOU:  °· Understand these instructions. °· Will watch your condition. °· Will get help right away if you are not doing well or get worse. °  °This information is not intended to replace advice given to you by your health care provider. Make sure you discuss any questions you have with your health care provider. °  °Document Released: 11/03/2007 Document Revised: 08/09/2011 Document Reviewed: 12/27/2012 °Elsevier Interactive Patient Education ©2016 Elsevier Inc. ° °
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
## 2015-07-04 ENCOUNTER — Emergency Department (HOSPITAL_COMMUNITY)
Admission: EM | Admit: 2015-07-04 | Discharge: 2015-07-04 | Disposition: A | Discharge status: Home / Self Care | Payer: Medicaid Other | Attending: Emergency Medicine | Admitting: Emergency Medicine

## 2015-07-04 ENCOUNTER — Emergency Department (HOSPITAL_COMMUNITY): Payer: Medicaid Other

## 2015-07-04 ENCOUNTER — Encounter (HOSPITAL_COMMUNITY): Payer: Self-pay | Admitting: Emergency Medicine

## 2015-07-04 DIAGNOSIS — S50311A Abrasion of right elbow, initial encounter: Secondary | ICD-10-CM

## 2015-07-04 DIAGNOSIS — Y9289 Other specified places as the place of occurrence of the external cause: Secondary | ICD-10-CM | POA: Insufficient documentation

## 2015-07-04 DIAGNOSIS — S59901A Unspecified injury of right elbow, initial encounter: Secondary | ICD-10-CM | POA: Diagnosis present

## 2015-07-04 DIAGNOSIS — F319 Bipolar disorder, unspecified: Secondary | ICD-10-CM | POA: Diagnosis not present

## 2015-07-04 DIAGNOSIS — Z79899 Other long term (current) drug therapy: Secondary | ICD-10-CM | POA: Insufficient documentation

## 2015-07-04 DIAGNOSIS — Y998 Other external cause status: Secondary | ICD-10-CM | POA: Insufficient documentation

## 2015-07-04 DIAGNOSIS — IMO0002 Reserved for concepts with insufficient information to code with codable children: Secondary | ICD-10-CM

## 2015-07-04 DIAGNOSIS — Z880 Allergy status to penicillin: Secondary | ICD-10-CM | POA: Insufficient documentation

## 2015-07-04 DIAGNOSIS — S51011A Laceration without foreign body of right elbow, initial encounter: Secondary | ICD-10-CM | POA: Diagnosis not present

## 2015-07-04 DIAGNOSIS — Y9389 Activity, other specified: Secondary | ICD-10-CM | POA: Insufficient documentation

## 2015-07-04 MED ORDER — IBUPROFEN 400 MG PO TABS
600.0000 mg | ORAL_TABLET | Freq: Once | ORAL | Status: AC
  Administered 2015-07-04: 600 mg via ORAL
  Filled 2015-07-04: qty 1
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
## 2015-07-04 NOTE — Discharge Instructions (Signed)
Abrasion °An abrasion is a cut or scrape on the surface of your skin. An abrasion does not go through all of the layers of your skin. It is important to take good care of your abrasion to prevent infection. °HOME CARE °Medicines °· Take or apply medicines only as told by your doctor. °· If you were prescribed an antibiotic ointment, finish all of it even if you start to feel better. °Wound Care °· Clean the wound with mild soap and water 2-3 times per day or as told by your doctor. Pat your wound dry with a clean towel. Do not rub it. °· There are many ways to close and cover a wound. Follow instructions from your doctor about: °¨ How to take care of your wound. °¨ When and how you should change your bandage (dressing). °¨ When and how you should take off your dressing. °· Check your wound every day for signs of infection. Watch for: °¨ Redness, swelling, or pain. °¨ Fluid, blood, or pus. °General Instructions °· Keep the dressing dry as told by your doctor. Do not take baths, swim, use a hot tub, or do anything that would put your wound underwater until your doctor says it is okay. °· If there is swelling, raise (elevate) the injured area above the level of your heart while you are sitting or lying down. °· Keep all follow-up visits as told by your doctor. This is important. °GET HELP IF: °· You were given a tetanus shot and you have any of these where the needle went in: °¨ Swelling. °¨ Very bad pain. °¨ Redness. °¨ Bleeding. °· Medicine does not help your pain. °· You have any of these at the site of the wound: °¨ More redness. °¨ More swelling. °¨ More pain. °GET HELP RIGHT AWAY IF: °· You have a red streak going away from your wound. °· You have a fever. °· You have fluid, blood, or pus coming from your wound. °· There is a bad smell coming from your wound. °  °This information is not intended to replace advice given to you by your health care provider. Make sure you discuss any questions you have with your  health care provider. °  °Document Released: 11/03/2007 Document Revised: 10/01/2014 Document Reviewed: 05/15/2014 °Elsevier Interactive Patient Education ©2016 Elsevier Inc. ° °
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
## 2015-07-05 ENCOUNTER — Encounter (HOSPITAL_COMMUNITY): Payer: Self-pay | Admitting: *Deleted

## 2015-07-05 ENCOUNTER — Emergency Department (HOSPITAL_COMMUNITY)
Admission: EM | Admit: 2015-07-05 | Discharge: 2015-07-05 | Disposition: A | Discharge status: Home / Self Care | Payer: Medicaid Other | Attending: Emergency Medicine | Admitting: Emergency Medicine

## 2015-07-05 DIAGNOSIS — Z880 Allergy status to penicillin: Secondary | ICD-10-CM | POA: Diagnosis not present

## 2015-07-05 DIAGNOSIS — F209 Schizophrenia, unspecified: Secondary | ICD-10-CM | POA: Insufficient documentation

## 2015-07-05 DIAGNOSIS — Z008 Encounter for other general examination: Secondary | ICD-10-CM | POA: Diagnosis present

## 2015-07-05 DIAGNOSIS — F319 Bipolar disorder, unspecified: Secondary | ICD-10-CM | POA: Diagnosis not present

## 2015-07-05 DIAGNOSIS — Z79899 Other long term (current) drug therapy: Secondary | ICD-10-CM | POA: Diagnosis not present

## 2015-07-05 DIAGNOSIS — F4320 Adjustment disorder, unspecified: Secondary | ICD-10-CM | POA: Insufficient documentation
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
## 2015-07-05 NOTE — ED Notes (Addendum)
Pt states he felt hurt like hurting himself today and would like to talk to someone. Pt states he forgot to take his medication this morning, which he states keeps him from wanting to hurt himself. Pt went to his counselor at [facility] this morning, who sent him to ED. Pt states he does not feel like hurting himself at this time, states "I just need my medicine".

## 2015-07-05 NOTE — Discharge Instructions (Signed)
Do not skip medications.  Continue your follow up with your therapist.

## 2015-07-09 ENCOUNTER — Ambulatory Visit: Payer: Self-pay | Admitting: Internal Medicine

## 2015-07-16 ENCOUNTER — Ambulatory Visit: Payer: Self-pay | Admitting: Neurology

## 2015-07-30 ENCOUNTER — Encounter (HOSPITAL_COMMUNITY): Payer: Self-pay | Admitting: Family Medicine

## 2015-07-30 ENCOUNTER — Emergency Department (HOSPITAL_COMMUNITY): Payer: Medicaid Other

## 2015-07-30 ENCOUNTER — Telehealth: Payer: Self-pay | Admitting: Internal Medicine

## 2015-07-30 ENCOUNTER — Emergency Department (HOSPITAL_COMMUNITY)
Admission: EM | Admit: 2015-07-30 | Discharge: 2015-07-30 | Disposition: A | Discharge status: Home / Self Care | Payer: Medicaid Other | Attending: Emergency Medicine | Admitting: Emergency Medicine

## 2015-07-30 DIAGNOSIS — Z880 Allergy status to penicillin: Secondary | ICD-10-CM | POA: Insufficient documentation

## 2015-07-30 DIAGNOSIS — S61219A Laceration without foreign body of unspecified finger without damage to nail, initial encounter: Secondary | ICD-10-CM

## 2015-07-30 DIAGNOSIS — F209 Schizophrenia, unspecified: Secondary | ICD-10-CM | POA: Diagnosis not present

## 2015-07-30 DIAGNOSIS — W01198A Fall on same level from slipping, tripping and stumbling with subsequent striking against other object, initial encounter: Secondary | ICD-10-CM | POA: Diagnosis not present

## 2015-07-30 DIAGNOSIS — S60222A Contusion of left hand, initial encounter: Secondary | ICD-10-CM | POA: Diagnosis not present

## 2015-07-30 DIAGNOSIS — Z79899 Other long term (current) drug therapy: Secondary | ICD-10-CM | POA: Diagnosis not present

## 2015-07-30 DIAGNOSIS — Y998 Other external cause status: Secondary | ICD-10-CM | POA: Insufficient documentation

## 2015-07-30 DIAGNOSIS — F319 Bipolar disorder, unspecified: Secondary | ICD-10-CM | POA: Diagnosis not present

## 2015-07-30 DIAGNOSIS — Y92009 Unspecified place in unspecified non-institutional (private) residence as the place of occurrence of the external cause: Secondary | ICD-10-CM | POA: Diagnosis not present

## 2015-07-30 DIAGNOSIS — Y9301 Activity, walking, marching and hiking: Secondary | ICD-10-CM | POA: Insufficient documentation

## 2015-07-30 DIAGNOSIS — S61213A Laceration without foreign body of left middle finger without damage to nail, initial encounter: Secondary | ICD-10-CM | POA: Diagnosis present

## 2015-07-30 MED ORDER — NAPROXEN 250 MG PO TABS
500.0000 mg | ORAL_TABLET | Freq: Once | ORAL | Status: AC
  Administered 2015-07-30: 500 mg via ORAL
  Filled 2015-07-30: qty 2

## 2015-07-30 MED ORDER — NAPROXEN 500 MG PO TABS
500.0000 mg | ORAL_TABLET | Freq: Two times a day (BID) | ORAL | Status: DC
Start: 2015-07-30 — End: 2015-08-22

## 2015-07-30 MED ORDER — LIDOCAINE HCL (PF) 1 % IJ SOLN
5.0000 mL | Freq: Once | INTRAMUSCULAR | Status: AC
  Administered 2015-07-30: 5 mL
  Filled 2015-07-30: qty 5

## 2015-07-30 NOTE — Telephone Encounter (Signed)
Ivy from [employer] called wanting to know status of fax sent 2/17. Personal Care Form ICD 10 transition form. Please follow up.

## 2015-07-30 NOTE — ED Notes (Signed)
Patient transported to X-ray 

## 2015-07-30 NOTE — ED Notes (Signed)
Pt here for trip and fall and night injuring left hand. Small lac to middle finger

## 2015-07-30 NOTE — Discharge Instructions (Signed)
You were seen in the ER today for evaluation of your left hand after a fall. Your x-ray was normal. We placed one suture which will need to be checked and removed in 7-10 days. You may see your primary care provider or go to any Urgent Care or ER for that. I will give you a prescription for naproxen which you may take as needed for pain. Return to the ER for new or worsening symptoms such as redness spreading up your finger, discharge, heavy bleeding, etc.

## 2015-07-30 NOTE — ED Provider Notes (Signed)
CSN: [id number]     Arrival date & time 07/30/15  [id number] History   First MD Initiated Contact with Patient 07/30/15 [phone]     Chief Complaint  Patient presents with  . Fall  . Hand Injury   HPI  Mr. Tillar is an 35 y.o. male who presents to the ED for evaluation of laceration to his left middle finger. He states he was walking in the house last night and it was dark and he couldn't see and tripped and hit his hand on something. He states that he cut he back of his left middle finger. States he initially cleaned it out with water but has not taken anything for the pain nor done anything else to care for the wound. States the entire proximal portion of his finger is painful and tender. Denies numbness or tingling. Denies hitting his head or LOC. Denies any other pain. Last Tdap within the past 6 months.  Past Medical History  Diagnosis Date  . Depression   . Seizures ([place])   . Bipolar 1 disorder ([place])   . Schizophrenic disorder ([place])    History reviewed. No pertinent past surgical history. Family History  Problem Relation Age of Onset  . Heart disease Mother    Social History  Substance Use Topics  . Smoking status: Never Smoker   . Smokeless tobacco: Never Used  . Alcohol Use: No    Review of Systems  All other systems reviewed and are negative.     Allergies  Coconut flavor; Codeine; Penicillins; and Tape  Home Medications   Prior to Admission medications   Medication Sig Start Date End Date Taking? Authorizing Provider  divalproex (DEPAKOTE ER) 500 MG 24 hr tablet Take 500 mg by mouth at bedtime.    Historical Provider, MD  FLUoxetine (PROZAC) 20 MG capsule Take 20 mg by mouth daily.    Historical Provider, MD  HYDROcodone-acetaminophen (NORCO/VICODIN) 5-325 MG tablet Take 1 tablet by mouth every 4 (four) hours as needed. Patient not taking: Reported on 07/05/2015 06/26/15   Tanna Furry, MD  ibuprofen (ADVIL,MOTRIN) 400 MG tablet Take 1 tablet (400 mg total) by mouth every 6  (six) hours as needed. Patient not taking: Reported on 05/20/2015 01/10/15   Marella Chimes, PA-C  lamoTRIgine (LAMICTAL) 100 MG tablet Take 2 tablets (200 mg total) by mouth at bedtime. Patient taking differently: Take 100 mg by mouth 2 (two) times daily.  05/10/15   Kristen N Ward, DO  methocarbamol (ROBAXIN) 500 MG tablet Take 1 tablet (500 mg total) by mouth 3 (three) times daily between meals as needed. Patient not taking: Reported on 07/04/2015 06/26/15   Tanna Furry, MD  permethrin (ELIMITE) 5 % cream Apply to affected area once Patient not taking: Reported on 07/04/2015 05/10/15   Kristen N Ward, DO   BP 139/82 mmHg  Pulse 71  Temp(Src) 97.2 F (36.2 C) (Oral)  Resp 18  SpO2 100% Physical Exam  Constitutional: He is oriented to person, place, and time. No distress.  HENT:  Head: Atraumatic.  Right Ear: External ear normal.  Left Ear: External ear normal.  Nose: Nose normal.  Eyes: Conjunctivae are normal. No scleral icterus.  Neck: Normal range of motion. Neck supple.  Cardiovascular: Normal rate and regular rhythm.   Pulmonary/Chest: Effort normal. No respiratory distress. He exhibits no tenderness.  Abdominal: Soft. He exhibits no distension. There is no tenderness.  Neurological: He is alert and oriented to person, place, and time.  Skin: Skin  is warm and dry. He is not diaphoretic.  Dorsal aspect of left middle finger with superficial 1.5cm laceration just inferior to PIP. Visualized to bottom of wound in bloodless field. No foreign bodies visualized or palpated. Surrounding area diffusely ttp. Intact strength and sensation of hand and fingers. Good cap refill.   Psychiatric: He has a normal mood and affect. His behavior is normal.  Nursing note and vitals reviewed.   ED Course  Procedures (including critical care time)  LACERATION REPAIR Performed by: Delrae Rend Authorized by: Delrae Rend Consent: Verbal consent obtained. Risks and benefits: risks, benefits and  alternatives were discussed Consent given by: patient Patient identity confirmed: provided demographic data Prepped and Draped in normal sterile fashion Wound explored  Laceration Location: left middle finger  Laceration Length: 1.5cm  No Foreign Bodies seen or palpated  Anesthesia: digital block  Local anesthetic: lidocaine 1% without epinephrine  Anesthetic total: 4 ml  Irrigation method: syringe Amount of cleaning: standard  Skin closure: 6-0 ethilon  Number of sutures: 1  Technique: horizontal mattress  Patient tolerance: Patient tolerated the procedure well with no immediate complications.   Labs Review Labs Reviewed - No data to display  Imaging Review Dg Hand Complete Left  07/30/2015  CLINICAL DATA:  Pain following fall EXAM: LEFT HAND - COMPLETE 3+ VIEW COMPARISON:  None. FINDINGS: Frontal, oblique, and lateral views were obtained. There is no demonstrable fracture or dislocation. The joint spaces appear normal. No erosive change. There is a bone island in the distal aspect of the third distal phalanx. No radiopaque foreign body. IMPRESSION: No demonstrable fracture or dislocation. No appreciable joint space narrowing. No radiopaque foreign body. Electronically Signed   By: Lowella Grip III M.D.   On: 07/30/2015 10:53   I have personally reviewed and evaluated these images and lab results as part of my medical decision-making.   EKG Interpretation None      MDM   Final diagnoses:  Contusion of left hand, initial encounter  Laceration of finger without foreign body without damage to nail, initial encounter   Pt with laceration to dorsal aspect of left middle finger. Occurred last night. Discussed with pt and his wife tx options. THey would like to proceed with suture repair. Given tenderness and pain, and proximity of lac to joint will get x-ray to r/o bony abnormality, though i have low suspicion for acute bony injury.   Pt tolerated lac repair well.  X-ray reveals no acute abnormality. Patient may be safely discharged home. Discussed reasons for return (fever, stitches break/wound reopens, purulent wound drainage, redness, increasing pain). Patient to follow-up with primary care provider, ER, or U[facility] within 7-10 days for suture removal. Patient in understanding and agreement with the plan.     Anne Ng, PA-C 07/30/15 1527  Blanchie Dessert, MD 07/31/15 [phone]

## 2015-07-31 ENCOUNTER — Encounter (HOSPITAL_COMMUNITY): Payer: Self-pay | Admitting: Nurse Practitioner

## 2015-07-31 ENCOUNTER — Emergency Department (HOSPITAL_COMMUNITY)
Admission: EM | Admit: 2015-07-31 | Discharge: 2015-07-31 | Disposition: A | Discharge status: Home / Self Care | Payer: Medicaid Other | Attending: Emergency Medicine | Admitting: Emergency Medicine

## 2015-07-31 DIAGNOSIS — Y9289 Other specified places as the place of occurrence of the external cause: Secondary | ICD-10-CM | POA: Diagnosis not present

## 2015-07-31 DIAGNOSIS — Y999 Unspecified external cause status: Secondary | ICD-10-CM | POA: Insufficient documentation

## 2015-07-31 DIAGNOSIS — S61219A Laceration without foreign body of unspecified finger without damage to nail, initial encounter: Secondary | ICD-10-CM | POA: Diagnosis not present

## 2015-07-31 DIAGNOSIS — Y9389 Activity, other specified: Secondary | ICD-10-CM | POA: Insufficient documentation

## 2015-07-31 DIAGNOSIS — X58XXXA Exposure to other specified factors, initial encounter: Secondary | ICD-10-CM | POA: Diagnosis not present

## 2015-07-31 DIAGNOSIS — S6990XA Unspecified injury of unspecified wrist, hand and finger(s), initial encounter: Secondary | ICD-10-CM | POA: Diagnosis present

## 2015-07-31 NOTE — ED Notes (Signed)
Called at 2:05 pm no answer moved off the floor

## 2015-07-31 NOTE — ED Notes (Signed)
Called in sub waiting and waiting room at 1:10pm no answer

## 2015-07-31 NOTE — ED Notes (Signed)
HE WAS HERE YESTERDAY for a finger laceration and he thinks that one of his stitches came out

## 2015-08-01 ENCOUNTER — Telehealth: Payer: Self-pay

## 2015-08-01 NOTE — Telephone Encounter (Signed)
-----   Message from Lance Bosch, NP sent at 08/01/2015  7:03 AM EST ----- I faxed that several days ago. I believe it was in that pile of paperwork I gave you.   ----- Message -----    From: Dorothe Pea, LPN    Sent: [id number]   9:55 AM      To: Lance Bosch, NP   Franklin [name], 35 y.o., [birth year] Last Weight:  155 lb (70.308 kg) Phone:  [phone] PCP:  Chari Manning A Language:  English Need Interp:  No AllergiesCoconut Flavor Codeine Penicillins Tape Health Maintenance:  Due FYIGeneral Primary Ins:  246 MRN:  [id number] MyChart:  Pending Next Appt:  None Brittney, Remund - 07/30/2015 11:18 AM >','<< Less Detail',event)" href="javascript:;">More Detail >>  Franklin [name]  Sent: Wed July 30, 2015 11:21 AM   To: Dorothe Pea, LPN     [facility] Size    Small Medium Large Extra Extra Large  Franklin [name] 07/30/2015  Telephone MRN:  [id number]   Description: 35 year old [name] Provider: Lance Bosch, NP Department: [facility]    Reason for Call    Paperwork      Call Documentation      Franklin [name] at 07/30/2015 11:18 AM    Status: Signed      Expand All Collapse All    Ivy from [employer] called wanting to know status of fax sent 2/17. Personal Care Form ICD 10 transition form. Please follow up.

## 2015-08-01 NOTE — Telephone Encounter (Signed)
Returned patient phone call Patient not available Message left on voice mail to return our call 

## 2015-08-06 ENCOUNTER — Emergency Department (HOSPITAL_COMMUNITY)
Admission: EM | Admit: 2015-08-06 | Discharge: 2015-08-06 | Disposition: A | Discharge status: Home / Self Care | Payer: Medicaid Other | Attending: Emergency Medicine | Admitting: Emergency Medicine

## 2015-08-06 ENCOUNTER — Encounter (HOSPITAL_COMMUNITY): Payer: Self-pay | Admitting: Vascular Surgery

## 2015-08-06 DIAGNOSIS — Z4802 Encounter for removal of sutures: Secondary | ICD-10-CM

## 2015-08-06 DIAGNOSIS — Z880 Allergy status to penicillin: Secondary | ICD-10-CM | POA: Insufficient documentation

## 2015-08-06 DIAGNOSIS — Z79899 Other long term (current) drug therapy: Secondary | ICD-10-CM | POA: Insufficient documentation

## 2015-08-06 DIAGNOSIS — M546 Pain in thoracic spine: Secondary | ICD-10-CM | POA: Insufficient documentation

## 2015-08-06 DIAGNOSIS — F319 Bipolar disorder, unspecified: Secondary | ICD-10-CM | POA: Insufficient documentation

## 2015-08-06 DIAGNOSIS — F209 Schizophrenia, unspecified: Secondary | ICD-10-CM | POA: Diagnosis not present

## 2015-08-06 DIAGNOSIS — M549 Dorsalgia, unspecified: Secondary | ICD-10-CM

## 2015-08-06 MED ORDER — METHOCARBAMOL 500 MG PO TABS: 500.0000 mg | ORAL_TABLET | Freq: Two times a day (BID) | ORAL | Status: DC

## 2015-08-06 MED ORDER — ACETAMINOPHEN 325 MG PO TABS
650.0000 mg | ORAL_TABLET | Freq: Once | ORAL | Status: DC
  Filled 2015-08-06: qty 2

## 2015-08-06 MED ORDER — KETOROLAC TROMETHAMINE 60 MG/2ML IM SOLN
60.0000 mg | Freq: Once | INTRAMUSCULAR | Status: DC
  Filled 2015-08-06: qty 2

## 2015-08-06 NOTE — Discharge Instructions (Signed)
Take the prescribed medication as directed.  May take this with tylenol or motrin at home. Follow-up with your primary care doctor. Return to the ED for new or worsening symptoms.

## 2015-08-06 NOTE — ED Notes (Signed)
Patient refused toradol, stating it makes him sick on his stomach.   Patient refused tylenol stating "it doesn't work, ibuprofen doesn't work, Teacher, adult education work.   So I might as well just suffer".   Patient was asked when he refused toradol if tylenol was okay, and he agreed then refused when I brought it to him.

## 2015-08-06 NOTE — ED Provider Notes (Signed)
CSN: [id number]     Arrival date & time 08/06/15  1246 History  By signing my name below, I, Evelene Croon, attest that this documentation has been prepared under the direction and in the presence of non-physician practitioner, Quincy Carnes, PA-C. Electronically Signed: Evelene Croon, Scribe. 08/06/2015. 2:48 PM.    Chief Complaint  Patient presents with  . Back Pain  . Finger Injury    The history is provided by the patient. No language interpreter was used.     HPI Comments:  Treavon Salters is a 35 y.o. male who presents to the Emergency Department to have suture removed from left middle finger. Pt had the suture placed in the ED on 07/30/15 after fall. Tetanus is UTD.  At this time pt also complains of moderate mid-back pain. Pt denies injury but states he recent helped cousin move and was lifting heavy items including boxes and furniture. He has taken ibuprofen, tylenol, and advil without relief. He denies h/o back surgery, fever, dysuria, numbness/weakness in his extremities and bowel/bladder incontinence.     Past Medical History  Diagnosis Date  . Depression   . Seizures ([place])   . Bipolar 1 disorder ([place])   . Schizophrenic disorder ([place])    History reviewed. No pertinent past surgical history. Family History  Problem Relation Age of Onset  . Heart disease Mother    Social History  Substance Use Topics  . Smoking status: Never Smoker   . Smokeless tobacco: Never Used  . Alcohol Use: No    Review of Systems  Constitutional: Negative for fever.  Genitourinary: Negative for dysuria, urgency and frequency.  Musculoskeletal: Positive for back pain.  Skin: Positive for wound (suture removal).  Neurological: Negative for weakness and numbness.  All other systems reviewed and are negative.   Allergies  Coconut flavor; Codeine; Penicillins; and Tape  Home Medications   Prior to Admission medications   Medication Sig Start Date End Date Taking? Authorizing Provider   divalproex (DEPAKOTE ER) 500 MG 24 hr tablet Take 500 mg by mouth at bedtime.    Historical Provider, MD  FLUoxetine (PROZAC) 20 MG capsule Take 20 mg by mouth daily.    Historical Provider, MD  HYDROcodone-acetaminophen (NORCO/VICODIN) 5-325 MG tablet Take 1 tablet by mouth every 4 (four) hours as needed. Patient not taking: Reported on 07/05/2015 06/26/15   Tanna Furry, MD  ibuprofen (ADVIL,MOTRIN) 400 MG tablet Take 1 tablet (400 mg total) by mouth every 6 (six) hours as needed. Patient not taking: Reported on 05/20/2015 01/10/15   Marella Chimes, PA-C  lamoTRIgine (LAMICTAL) 100 MG tablet Take 2 tablets (200 mg total) by mouth at bedtime. Patient taking differently: Take 100 mg by mouth 2 (two) times daily.  05/10/15   Kristen N Ward, DO  methocarbamol (ROBAXIN) 500 MG tablet Take 1 tablet (500 mg total) by mouth 3 (three) times daily between meals as needed. Patient not taking: Reported on 07/04/2015 06/26/15   Tanna Furry, MD  naproxen (NAPROSYN) 500 MG tablet Take 1 tablet (500 mg total) by mouth 2 (two) times daily. 07/30/15   Olivia Canter Sam, PA-C  permethrin (ELIMITE) 5 % cream Apply to affected area once Patient not taking: Reported on 07/04/2015 05/10/15   Kristen N Ward, DO   BP 120/72 mmHg  Pulse 64  Temp(Src) 97.6 F (36.4 C) (Oral)  Resp 18  Ht 6' (1.829 m)  Wt 156 lb (70.761 kg)  BMI 21.15 kg/m2  SpO2 97%   Physical Exam  Constitutional: He is oriented to person, place, and time. He appears well-developed and well-nourished.  HENT:  Head: Normocephalic and atraumatic.  Mouth/Throat: Oropharynx is clear and moist.  Eyes: Conjunctivae and EOM are normal. Pupils are equal, round, and reactive to light.  Neck: Normal range of motion.  Cardiovascular: Normal rate, regular rhythm and normal heart sounds.   Pulmonary/Chest: Effort normal and breath sounds normal.  Abdominal: Soft. Bowel sounds are normal.  Musculoskeletal: Normal range of motion.  Single intact suture of left  middle finger, proximal phalanx; wound is well-healed without any signs of infection Endorses mid-back pain, no deformities or tenderness noted on exam; moving all extremities well, ambulatory with steady gait  Neurological: He is alert and oriented to person, place, and time.  Skin: Skin is warm and dry.  Psychiatric: He has a normal mood and affect.  Nursing note and vitals reviewed.   ED Course  .Suture Removal Date/Time: 08/06/2015 4:02 PM Performed by: Larene Pickett Authorized by: Larene Pickett Consent: Verbal consent obtained. Risks and benefits: risks, benefits and alternatives were discussed Consent given by: patient Patient understanding: patient states understanding of the procedure being performed Required items: required blood products, implants, devices, and special equipment available Patient identity confirmed: verbally with patient Body area: upper extremity Location details: right long finger Wound Appearance: clean Sutures Removed: 1 Post-removal: dressing applied Facility: sutures placed in this facility Patient tolerance: Patient tolerated the procedure well with no immediate complications     DIAGNOSTIC STUDIES:  Oxygen Saturation is 97% on RA, normal by my interpretation.    COORDINATION OF CARE:  2:4 PM Discussed treatment plan with pt at bedside and pt agreed to plan.   MDM   Final diagnoses:  Visit for suture removal  Back pain, unspecified location   34 year old male here for suture removal. Sutures were placed in left middle finger last week. Wound is currently well-healed without any signs of infection. His tetanus is up-to-date. Sutures removed without difficulty, patient tolerated well. He is also complaining of back pain after helping a friend move. Endorses pain in his mid back. He has no deformity or apparent tenderness noted on exam. No focal neurologic deficits currently. Patient has been seen in the ED multiple times for chronic back  pain. He was offered Toradol and Tylenol here in the ED but refused both. Will discharge home with Robaxin.  Discussed plan with patient, he/she acknowledged understanding and agreed with plan of care.  Return precautions given for new or worsening symptoms.  I personally performed the services described in this documentation, which was scribed in my presence. The recorded information has been reviewed and is accurate.  Larene Pickett, PA-C 08/06/15 1617  Tanna Furry, MD 08/12/15 2015

## 2015-08-06 NOTE — ED Notes (Signed)
Patient able to ambulate independently  

## 2015-08-06 NOTE — ED Notes (Signed)
Pt reports to the ED for eval of low back pain and suture removal of left middle finger. Pt reports the back pain started 2 days ago. Denies any numbness, tingling, paralysis, or bowel or bladder incontinence. Denies any known injury. Pt unsure of when sutures to finger was placed. Pt A&OX4, resp e/u, and skin warm and dry.

## 2015-08-13 ENCOUNTER — Encounter (HOSPITAL_COMMUNITY): Payer: Self-pay | Admitting: Emergency Medicine

## 2015-08-13 ENCOUNTER — Emergency Department (HOSPITAL_COMMUNITY)
Admission: EM | Admit: 2015-08-13 | Discharge: 2015-08-13 | Disposition: A | Discharge status: Home / Self Care | Payer: Medicaid Other | Attending: Emergency Medicine | Admitting: Emergency Medicine

## 2015-08-13 DIAGNOSIS — R569 Unspecified convulsions: Secondary | ICD-10-CM | POA: Diagnosis present

## 2015-08-13 DIAGNOSIS — F319 Bipolar disorder, unspecified: Secondary | ICD-10-CM | POA: Diagnosis not present

## 2015-08-13 DIAGNOSIS — Z880 Allergy status to penicillin: Secondary | ICD-10-CM | POA: Diagnosis not present

## 2015-08-13 DIAGNOSIS — Z791 Long term (current) use of non-steroidal anti-inflammatories (NSAID): Secondary | ICD-10-CM | POA: Insufficient documentation

## 2015-08-13 DIAGNOSIS — Z79899 Other long term (current) drug therapy: Secondary | ICD-10-CM | POA: Insufficient documentation

## 2015-08-13 LAB — BASIC METABOLIC PANEL
Anion gap: 6 (ref 5–15)
BUN: 12 mg/dL (ref 6–20)
CHLORIDE: 104 mmol/L (ref 101–111)
CO2: 28 mmol/L (ref 22–32)
CREATININE: 0.78 mg/dL (ref 0.61–1.24)
Calcium: 9.5 mg/dL (ref 8.9–10.3)
GFR calc Af Amer: >60 mL/min (ref >60)
GFR calc non Af Amer: >60 mL/min (ref >60)
Glucose, Bld: 97 mg/dL (ref 65–99)
Potassium: 3.8 mmol/L (ref 3.5–5.1)
SODIUM: 138 mmol/L (ref 135–145)

## 2015-08-13 LAB — CBC
HCT: 41.4 % (ref 39.0–52.0)
Hemoglobin: 14.9 g/dL (ref 13.0–17.0)
MCH: 30.6 pg (ref 26.0–34.0)
MCHC: 36 g/dL (ref 30.0–36.0)
MCV: 85 fL (ref 78.0–100.0)
PLATELETS: 170 10*3/uL (ref 150–400)
RBC: 4.87 MIL/uL (ref 4.22–5.81)
RDW: 12.7 % (ref 11.5–15.5)
WBC: 7.7 10*3/uL (ref 4.0–10.5)

## 2015-08-13 LAB — VALPROIC ACID LEVEL: VALPROIC ACID LVL: 17 ug/mL — AB (ref 50.0–100.0)

## 2015-08-13 MED ORDER — DIVALPROEX SODIUM 500 MG PO DR TAB
500.0000 mg | DELAYED_RELEASE_TABLET | Freq: Once | ORAL | Status: AC
  Administered 2015-08-13: 500 mg via ORAL
  Filled 2015-08-13: qty 1

## 2015-08-13 NOTE — ED Provider Notes (Signed)
CSN: [id number]     Arrival date & time 08/13/15  1826 History   First MD Initiated Contact with Patient 08/13/15 1845     Chief Complaint  Patient presents with  . Seizures   HPI   Franklin [name] is a 35 y.o. male PMH significant for depression, seizures, bipolar 1 disorder, schizophrenia presenting status post 4 episodes of seizure like activity. Per RN note, it was witnessed by patient's girlfriend and she states they lasted approximately 30 seconds each. Patient was not postictal or incontinent. He denies fevers, chills, chest pain, shortness of breath, headache, head injury, visual changes, abdominal pain, nausea, vomiting, loss of bowel or bladder control.  Past Medical History  Diagnosis Date  . Depression   . Seizures ([place])   . Bipolar 1 disorder ([place])   . Schizophrenic disorder ([place])    History reviewed. No pertinent past surgical history. Family History  Problem Relation Age of Onset  . Heart disease Mother    Social History  Substance Use Topics  . Smoking status: Never Smoker   . Smokeless tobacco: Never Used  . Alcohol Use: No    Review of Systems  Ten systems are reviewed and are negative for acute change except as noted in the HPI  Allergies  Coconut flavor; Codeine; Penicillins; and Tape  Home Medications   Prior to Admission medications   Medication Sig Start Date End Date Taking? Authorizing Provider  divalproex (DEPAKOTE ER) 500 MG 24 hr tablet Take 500 mg by mouth at bedtime.    Historical Provider, MD  FLUoxetine (PROZAC) 20 MG capsule Take 20 mg by mouth daily.    Historical Provider, MD  HYDROcodone-acetaminophen (NORCO/VICODIN) 5-325 MG tablet Take 1 tablet by mouth every 4 (four) hours as needed. Patient not taking: Reported on 07/05/2015 06/26/15   Tanna Furry, MD  ibuprofen (ADVIL,MOTRIN) 400 MG tablet Take 1 tablet (400 mg total) by mouth every 6 (six) hours as needed. Patient not taking: Reported on 05/20/2015 01/10/15   Marella Chimes, PA-C   lamoTRIgine (LAMICTAL) 100 MG tablet Take 2 tablets (200 mg total) by mouth at bedtime. Patient taking differently: Take 100 mg by mouth 2 (two) times daily.  05/10/15   Kristen N Ward, DO  methocarbamol (ROBAXIN) 500 MG tablet Take 1 tablet (500 mg total) by mouth 2 (two) times daily. 08/06/15   Larene Pickett, PA-C  naproxen (NAPROSYN) 500 MG tablet Take 1 tablet (500 mg total) by mouth 2 (two) times daily. 07/30/15   Olivia Canter Sam, PA-C  permethrin (ELIMITE) 5 % cream Apply to affected area once Patient not taking: Reported on 07/04/2015 05/10/15   Kristen N Ward, DO   Pulse 51  SpO2 100% Physical Exam  Constitutional: He is oriented to person, place, and time. He appears well-developed and well-nourished. No distress.  HENT:  Head: Normocephalic and atraumatic.  Mouth/Throat: Oropharynx is clear and moist. No oropharyngeal exudate.  Eyes: Conjunctivae are normal. Pupils are equal, round, and reactive to light. Right eye exhibits no discharge. Left eye exhibits no discharge. No scleral icterus.  Neck: No tracheal deviation present.  Cardiovascular: Normal rate, regular rhythm, normal heart sounds and intact distal pulses.  Exam reveals no gallop and no friction rub.   No murmur heard. Pulmonary/Chest: Effort normal and breath sounds normal. No respiratory distress. He has no wheezes. He has no rales. He exhibits no tenderness.  Abdominal: Soft. Bowel sounds are normal. He exhibits no distension and no mass. There is no tenderness.  There is no rebound and no guarding.  Musculoskeletal: Normal range of motion. He exhibits no edema or tenderness.  Lymphadenopathy:    He has no cervical adenopathy.  Neurological: He is alert and oriented to person, place, and time. No cranial nerve deficit. Coordination normal.  Skin: Skin is warm and dry. No rash noted. He is not diaphoretic. No erythema.  Psychiatric:  Flat affect  Nursing note and vitals reviewed.   ED Course  Procedures Labs  Review Labs Reviewed  VALPROIC ACID LEVEL - Abnormal; Notable for the following:    Valproic Acid Lvl 17 (*)    All other components within normal limits  CBC  BASIC METABOLIC PANEL   MDM   Final diagnoses:  Seizures ([place])   Patient nontoxic-appearing, vital signs stable. Most likely due to result of being noncompliant with Depakote as patient has a history of this. Valproic acid level was subtherapeutic at 17. CBC, BMP unremarkable. Patient given home dose of Depakote. Patient may be safely discharged home. Discussed reasons for return. Patient to follow-up with primary care provider within one week. Patient in understanding and agreement with the plan.  [place], PA-C 08/17/15 2211  Sherwood Gambler, MD 08/18/15 [phone]

## 2015-08-13 NOTE — ED Notes (Signed)
Bed: WA03 Expected date:  Expected time:  Means of arrival:  Comments: EMS-WITNESSED SEIZURE

## 2015-08-13 NOTE — ED Notes (Signed)
Discharge instructions and follow up care reviewed with patient. Patient verbalized understanding. 

## 2015-08-13 NOTE — ED Notes (Signed)
Per EMS: pt had witnessed seizure per girlfriend 4 - 30 sec seizures. Not post-itcal, no incontinence.

## 2015-08-13 NOTE — Discharge Instructions (Signed)
Mr. Franklin [name],  Nice meeting you! Please follow-up with your primary care provider and neurology. Take your medication as directed. Return to the emergency department if you develop headaches, seizures despite taking your medications properly, visual changes, or weakness. Feel better soon!  S. Wendie Simmer, PA-C Seizure, Adult A seizure is abnormal electrical activity in the brain. Seizures usually last from 30 seconds to 2 minutes. There are various types of seizures. Before a seizure, you may have a warning sensation (aura) that a seizure is about to occur. An aura may include the following symptoms:   Fear or anxiety.  Nausea.  Feeling like the room is spinning (vertigo).  Vision changes, such as seeing flashing lights or spots. Common symptoms during a seizure include:  A change in attention or behavior (altered mental status).  Convulsions with rhythmic jerking movements.  Drooling.  Rapid eye movements.  Grunting.  Loss of bladder and bowel control.  Bitter taste in the mouth.  Tongue biting. After a seizure, you may feel confused and sleepy. You may also have an injury resulting from convulsions during the seizure. HOME CARE INSTRUCTIONS   If you are given medicines, take them exactly as prescribed by your health care provider.  Keep all follow-up appointments as directed by your health care provider.  Do not swim or drive or engage in risky activity during which a seizure could cause further injury to you or others until your health care provider says it is OK.  Get adequate rest.  Teach friends and family what to do if you have a seizure. They should:  Lay you on the ground to prevent a fall.  Put a cushion under your head.  Loosen any tight clothing around your neck.  Turn you on your side. If vomiting occurs, this helps keep your airway clear.  Stay with you until you recover.  Know whether or not you need emergency care. SEEK IMMEDIATE MEDICAL  CARE IF:  The seizure lasts longer than 5 minutes.  The seizure is severe or you do not wake up immediately after the seizure.  You have an altered mental status after the seizure.  You are having more frequent or worsening seizures. Someone should drive you to the emergency department or call local emergency services (911 in [place] MAKE SURE YOU:  Understand these instructions.  Will watch your condition.  Will get help right away if you are not doing well or get worse.   This information is not intended to replace advice given to you by your health care provider. Make sure you discuss any questions you have with your health care provider.   Document Released: 05/14/2000 Document Revised: 06/07/2014 Document Reviewed: 12/27/2012 Elsevier Interactive Patient Education [employer].

## 2015-08-14 ENCOUNTER — Emergency Department (HOSPITAL_COMMUNITY)
Admission: EM | Admit: 2015-08-14 | Discharge: 2015-08-14 | Disposition: A | Discharge status: Home / Self Care | Payer: Medicaid Other | Attending: Emergency Medicine | Admitting: Emergency Medicine

## 2015-08-14 ENCOUNTER — Encounter (HOSPITAL_COMMUNITY): Payer: Self-pay

## 2015-08-14 DIAGNOSIS — G40909 Epilepsy, unspecified, not intractable, without status epilepticus: Secondary | ICD-10-CM | POA: Insufficient documentation

## 2015-08-14 DIAGNOSIS — Z79899 Other long term (current) drug therapy: Secondary | ICD-10-CM | POA: Insufficient documentation

## 2015-08-14 DIAGNOSIS — F209 Schizophrenia, unspecified: Secondary | ICD-10-CM | POA: Insufficient documentation

## 2015-08-14 DIAGNOSIS — Z880 Allergy status to penicillin: Secondary | ICD-10-CM | POA: Insufficient documentation

## 2015-08-14 DIAGNOSIS — F319 Bipolar disorder, unspecified: Secondary | ICD-10-CM | POA: Insufficient documentation

## 2015-08-14 DIAGNOSIS — R569 Unspecified convulsions: Secondary | ICD-10-CM | POA: Diagnosis present

## 2015-08-14 LAB — CBC WITH DIFFERENTIAL/PLATELET
Basophils Absolute: 0 K/uL (ref 0.0–0.1)
Basophils Relative: 0 %
Eosinophils Absolute: 0 K/uL (ref 0.0–0.7)
Eosinophils Relative: 0 %
HCT: 43.2 % (ref 39.0–52.0)
Hemoglobin: 15.2 g/dL (ref 13.0–17.0)
Lymphocytes Relative: 20 %
Lymphs Abs: 1.8 K/uL (ref 0.7–4.0)
MCH: 30 pg (ref 26.0–34.0)
MCHC: 35.2 g/dL (ref 30.0–36.0)
MCV: 85.2 fL (ref 78.0–100.0)
Monocytes Absolute: 0.5 K/uL (ref 0.1–1.0)
Monocytes Relative: 6 %
Neutro Abs: 6.6 K/uL (ref 1.7–7.7)
Neutrophils Relative %: 74 %
Platelets: 180 K/uL (ref 150–400)
RBC: 5.07 MIL/uL (ref 4.22–5.81)
RDW: 12.8 % (ref 11.5–15.5)
WBC: 8.9 K/uL (ref 4.0–10.5)

## 2015-08-14 LAB — BASIC METABOLIC PANEL WITH GFR
Anion gap: 8 (ref 5–15)
BUN: 14 mg/dL (ref 6–20)
CO2: 27 mmol/L (ref 22–32)
Calcium: 9.5 mg/dL (ref 8.9–10.3)
Chloride: 106 mmol/L (ref 101–111)
Creatinine, Ser: 0.87 mg/dL (ref 0.61–1.24)
GFR calc Af Amer: >60 mL/min
GFR calc non Af Amer: >60 mL/min
Glucose, Bld: 81 mg/dL (ref 65–99)
Potassium: 4.1 mmol/L (ref 3.5–5.1)
Sodium: 141 mmol/L (ref 135–145)

## 2015-08-14 LAB — VALPROIC ACID LEVEL: Valproic Acid Lvl: 85 ug/mL (ref 50.0–100.0)

## 2015-08-14 MED ORDER — DEXTROSE 5 % IV SOLN
1.0000 g | Freq: Once | INTRAVENOUS | Status: AC
  Administered 2015-08-14: 1000 mg via INTRAVENOUS
  Filled 2015-08-14: qty 10

## 2015-08-14 NOTE — ED Notes (Signed)
Pt given 2 sandwiches and 2 OJ

## 2015-08-14 NOTE — ED Provider Notes (Signed)
CSN: [id number]     Arrival date & time 08/14/15  1322 History   First MD Initiated Contact with Patient 08/14/15 1510     Chief Complaint  Patient presents with  . Seizures  . Back Pain     (Consider location/radiation/quality/duration/timing/severity/associated sxs/prior Treatment) HPI   Franklin [name]   Is a 35 year old male with past medical history of bipolar, schizophrenia, epilepsy who presents the emergency room today complaining of seizure-like activity. Level V caveat,patient does not remember event. Er patient's girlfriend she states that this morning he was laying down sleeping and then began drooling and having full body shaking. She states this lasted for 10 minutes and then resolved and then occurred again for a short amount of time After his second seizure patient became aggressive and kicking at the air and attempting to run outside girlfriendstates he is typically very aggressive after seizures.atientdid not take his home seizure medication until after his seizure activity today. He was seen in the emergency department yesterday for seizures as well with a subtherapeutic Depakote level. Patient has also not been taking his Seroquel for the last 2 weeks. He does state that he got his prescription filled today and will be able to take this medication tonight. Patient states when he is off his Seroquel he often forgets to take his Depakote. Denies headache,incontinenceback pain, belly pain, blurry vision, paresthesias, weakness.  Past Medical History  Diagnosis Date  . Depression   . Seizures ([place])   . Bipolar 1 disorder ([place])   . Schizophrenic disorder ([place])    History reviewed. No pertinent past surgical history. Family History  Problem Relation Age of Onset  . Heart disease Mother    Social History  Substance Use Topics  . Smoking status: Never Smoker   . Smokeless tobacco: Never Used  . Alcohol Use: No    Review of Systems  All other systems reviewed and are  negative.     Allergies  Coconut flavor; Codeine; Penicillins; and Tape  Home Medications   Prior to Admission medications   Medication Sig Start Date End Date Taking? Authorizing Provider  divalproex (DEPAKOTE) 500 MG DR tablet Take 500 mg by mouth 2 (two) times daily.   Yes Historical Provider, MD  FLUoxetine (PROZAC) 40 MG capsule Take 40 mg by mouth daily.   Yes Historical Provider, MD  QUEtiapine (SEROQUEL XR) 50 MG TB24 24 hr tablet Take 50 mg by mouth at bedtime.   Yes Historical Provider, MD  HYDROcodone-acetaminophen (NORCO/VICODIN) 5-325 MG tablet Take 1 tablet by mouth every 4 (four) hours as needed. Patient not taking: Reported on 07/05/2015 06/26/15   Tanna Furry, MD  lamoTRIgine (LAMICTAL) 100 MG tablet Take 2 tablets (200 mg total) by mouth at bedtime. Patient not taking: Reported on 08/13/2015 05/10/15   Kristen N Ward, DO  methocarbamol (ROBAXIN) 500 MG tablet Take 1 tablet (500 mg total) by mouth 2 (two) times daily. Patient not taking: Reported on 08/13/2015 08/06/15   Larene Pickett, PA-C  naproxen (NAPROSYN) 500 MG tablet Take 1 tablet (500 mg total) by mouth 2 (two) times daily. Patient not taking: Reported on 08/13/2015 07/30/15   Olivia Canter Sam, PA-C  permethrin (ELIMITE) 5 % cream Apply to affected area once Patient not taking: Reported on 07/04/2015 05/10/15   Kristen N Ward, DO   BP 119/83 mmHg  Pulse 64  Temp(Src) 97.9 F (36.6 C) (Oral)  Resp 16  SpO2 99% Physical Exam  Constitutional: He is oriented to person, place,  and time. He appears well-developed and well-nourished. No distress.  HENT:  Head: Normocephalic and atraumatic.  Mouth/Throat: No oropharyngeal exudate.  Eyes: Conjunctivae and EOM are normal. Pupils are equal, round, and reactive to light. Right eye exhibits no discharge. Left eye exhibits no discharge. No scleral icterus.  Neck: Neck supple.  Cardiovascular: Normal rate, regular rhythm, normal heart sounds and intact distal pulses.  Exam reveals  no gallop and no friction rub.   No murmur heard. Pulmonary/Chest: Effort normal and breath sounds normal. No respiratory distress. He has no wheezes. He has no rales. He exhibits no tenderness.  Abdominal: Soft. He exhibits no distension. There is no tenderness. There is no guarding.  Musculoskeletal: Normal range of motion. He exhibits no edema.  Lymphadenopathy:    He has no cervical adenopathy.  Neurological: He is alert and oriented to person, place, and time. No cranial nerve deficit. He exhibits normal muscle tone.  Strength 5/5 throughout. No sensory deficits.  No gait abnormality. Normal finger to nose.   Skin: Skin is warm and dry. No rash noted. He is not diaphoretic. No erythema. No pallor.  Psychiatric: He has a normal mood and affect. His behavior is normal.  Nursing note and vitals reviewed.   ED Course  Procedures (including critical care time) Labs Review Labs Reviewed  BASIC METABOLIC PANEL  CBC WITH DIFFERENTIAL/PLATELET  VALPROIC ACID LEVEL    Imaging Review No results found. I have personally reviewed and evaluated these images and lab results as part of my medical decision-making.   EKG Interpretation None      MDM   Final diagnoses:  Seizure [facility])    35 y.o M with Past medical history of epilepsy and schizophrenia. Present for seizure activity today. Suspect this is due to medication noncompliance. He was seen yesterday for the same. Patient's been off Seroquel for several weeks for his schizophrenia. He states that when he does not take his schizophrenia medication that he forgets to take his seizure medication. Patient took his Depakote today after he had his seizures. In the ED he is not postictal. He is alert and oriented. All vital signs are stable. Patient loaded with 1 g IV Depakote. Patient did keep Seroquel filled today and will begin taking this tonight. Follow-up with PCP as indicated in discharge instructions.  Case discussed with Dr. Eulis Foster  who agrees with treatment plan.   Dondra Spry [place], PA-C 08/14/15 [place], MD 08/18/15 2200

## 2015-08-14 NOTE — ED Notes (Signed)
Pt asking for food x 2

## 2015-08-14 NOTE — Progress Notes (Addendum)
Pt noted to give quick on sentence responses while using his cell phone until a male NT arrived She states she is not family but a friend of pt He states she assists him Provided pt with the SCAT flyer and application and list of medicaid transportation services in [place] Male NT to assist pt with SCAT application  ED visits 15 not having issues getting appointments  Had pt to ask [facility] about billing him for his co pays he states he can not pay for his medication co pays even at a cost of $3 and less at the [facility] pharmacy Male NT to assist pt with co pays

## 2015-08-14 NOTE — Progress Notes (Signed)
Entered in d/c instructions  Lance Bosch Go on 08/26/2015 please attend or call to reschedule your 08/26/15 4:30 pm appt This is your assigned Medicaid [place] access doctor If you prefer another contact [place] assigned your doctor *You may receive a bill if you go to any family Dr not assigned to you [place] [id number] [phone] medicaid [place] access coverage Call on 08/14/2015 [place]: [address], [place] [id number] [email or web address] Use this website to assist with understanding your coverage & to renew application As a Medicaid client you MUST contact DSS/SSI each time you change address, move to another [place] or another state to keep your address updated  [place] Medicaid Transportation to Dr appts if you are have full Medicaid: [phone], [id number]

## 2015-08-14 NOTE — Discharge Instructions (Signed)
Seizure, Adult A seizure is abnormal electrical activity in the brain. Seizures usually last from 30 seconds to 2 minutes. There are various types of seizures. Before a seizure, you may have a warning sensation (aura) that a seizure is about to occur. An aura may include the following symptoms:   Fear or anxiety.  Nausea.  Feeling like the room is spinning (vertigo).  Vision changes, such as seeing flashing lights or spots. Common symptoms during a seizure include:  A change in attention or behavior (altered mental status).  Convulsions with rhythmic jerking movements.  Drooling.  Rapid eye movements.  Grunting.  Loss of bladder and bowel control.  Bitter taste in the mouth.  Tongue biting. After a seizure, you may feel confused and sleepy. You may also have an injury resulting from convulsions during the seizure. HOME CARE INSTRUCTIONS   If you are given medicines, take them exactly as prescribed by your health care provider.  Keep all follow-up appointments as directed by your health care provider.  Do not swim or drive or engage in risky activity during which a seizure could cause further injury to you or others until your health care provider says it is OK.  Get adequate rest.  Teach friends and family what to do if you have a seizure. They should:  Lay you on the ground to prevent a fall.  Put a cushion under your head.  Loosen any tight clothing around your neck.  Turn you on your side. If vomiting occurs, this helps keep your airway clear.  Stay with you until you recover.  Know whether or not you need emergency care. SEEK IMMEDIATE MEDICAL CARE IF:  The seizure lasts longer than 5 minutes.  The seizure is severe or you do not wake up immediately after the seizure.  You have an altered mental status after the seizure.  You are having more frequent or worsening seizures. Someone should drive you to the emergency department or call local emergency  services (911 in [place] MAKE SURE YOU:  Understand these instructions.  Will watch your condition.  Will get help right away if you are not doing well or get worse.   This information is not intended to replace advice given to you by your health care provider. Make sure you discuss any questions you have with your health care provider.   Take your Seroquel and seizure medication as prescribed. Follow up with your PCP for re-evaluation. Return to the ED if you experience repeat seizure activity, confusion, chest pain, difficulty breathing, weakness.

## 2015-08-14 NOTE — ED Notes (Addendum)
Patient and cna states that he has been off of his seroquel for a week because he ran out and depakote levels were non-therapeutic from yesterday's blood work. Patient has taken his prozac and depakote this morning.

## 2015-08-14 NOTE — ED Notes (Signed)
EMS reported paranoid behavior, but nothing unusual noted during this writer's assessment.

## 2015-08-14 NOTE — ED Notes (Addendum)
Per EMS, Pt, from home, c/o focal seizure this afternoon and intermittent generalized back pain x 1 week.  Denies 10/10.  Pt was seen yesterday at [facility] for seizures.  Sts he has not been taking Seroquel x "a couple days," but a family member is picking up this prescription today.  EMS reports that the Pt had to be "taken down by Fire."  Pt reports he is typically aggressive after a seizure.

## 2015-08-19 ENCOUNTER — Encounter (HOSPITAL_COMMUNITY): Payer: Self-pay | Admitting: *Deleted

## 2015-08-19 ENCOUNTER — Emergency Department (HOSPITAL_COMMUNITY)
Admission: EM | Admit: 2015-08-19 | Discharge: 2015-08-19 | Disposition: A | Discharge status: Home / Self Care | Payer: Medicaid Other | Attending: Emergency Medicine | Admitting: Emergency Medicine

## 2015-08-19 DIAGNOSIS — F319 Bipolar disorder, unspecified: Secondary | ICD-10-CM | POA: Diagnosis not present

## 2015-08-19 DIAGNOSIS — F209 Schizophrenia, unspecified: Secondary | ICD-10-CM | POA: Insufficient documentation

## 2015-08-19 DIAGNOSIS — Z9119 Patient's noncompliance with other medical treatment and regimen: Secondary | ICD-10-CM | POA: Insufficient documentation

## 2015-08-19 DIAGNOSIS — G40909 Epilepsy, unspecified, not intractable, without status epilepticus: Secondary | ICD-10-CM

## 2015-08-19 DIAGNOSIS — Z880 Allergy status to penicillin: Secondary | ICD-10-CM | POA: Insufficient documentation

## 2015-08-19 DIAGNOSIS — Z79899 Other long term (current) drug therapy: Secondary | ICD-10-CM | POA: Diagnosis not present

## 2015-08-19 DIAGNOSIS — R569 Unspecified convulsions: Secondary | ICD-10-CM | POA: Diagnosis present

## 2015-08-19 LAB — VALPROIC ACID LEVEL: VALPROIC ACID LVL: 42 ug/mL — AB (ref 50.0–100.0)

## 2015-08-19 MED ORDER — DIVALPROEX SODIUM 500 MG PO DR TAB
500.0000 mg | DELAYED_RELEASE_TABLET | Freq: Once | ORAL | Status: AC
  Administered 2015-08-19: 500 mg via ORAL
  Filled 2015-08-19: qty 1

## 2015-08-19 NOTE — ED Notes (Signed)
Patient told NT that he came to ED to get away from his GF

## 2015-08-19 NOTE — ED Provider Notes (Signed)
Care assumed from previous provider PA [place], case discussed, plan agreed upon.  Patient is here for seizure like activity. Patient takes Depakote for known seizure disorder - Depakote level pending at shift change. Will follow up on lab and re-evaluate.   Gen: afebrile, VSS, NAD HEENT: EOMI, MMM Resp: no resp distress CV: RRR, no MRG Abd: soft, NT, ND MsK: moving all extremities well Neuro: A&O x4  Depakote level low. Patient states he has not taken meds yesterday or today. 500mg  Depakote given in ED. No seizure activity while in my care. Discussed importance of medication compliance with patient. Return precautions and follow up care discussed. All questions answered.   [facility] Khloe Hunkele, PA-C 08/19/15 1800  Virgel Manifold, MD 08/22/15 [phone]

## 2015-08-19 NOTE — ED Notes (Addendum)
PA made aware that pt refused blood work. At bedside now speaking with patient about this.  Pt resting comfortably in bed at this time.

## 2015-08-19 NOTE — ED Notes (Signed)
Pt refused labs.  RN aware.

## 2015-08-19 NOTE — Discharge Instructions (Signed)
Please take all medications as directed. It is very important to take seizure medication daily. Follow-up with your primary care physician in the next 3-5 days. Return to ER for new or worsening symptoms, any additional concerns.

## 2015-08-19 NOTE — ED Provider Notes (Signed)
CSN: [id number]     Arrival date & time 08/19/15  14 History   First MD Initiated Contact with Patient 08/19/15 1338     Chief Complaint  Patient presents with  . seizure activity      (Consider location/radiation/quality/duration/timing/severity/associated sxs/prior Treatment) HPI Comments: Patient with a history of bipolar, schizophrenia, seizures and medication noncompliance returns to the emergency department with complaint of 3 seizures today "back to back". He is not sure what time they occurred but states he was able to call EMS in between seizures. He states he was walking when he had the first seizure but denies injury with any fall to the ground. He denies recent fever, current pain, SOB, CP or cough. He has had one episode vomiting per day since yesterday. No diarrhea. He reports he is taking his depakote as prescribed for seizures.   The history is provided by the patient. No language interpreter was used.    Past Medical History  Diagnosis Date  . Depression   . Seizures ([place])   . Bipolar 1 disorder ([place])   . Schizophrenic disorder ([place])    History reviewed. No pertinent past surgical history. Family History  Problem Relation Age of Onset  . Heart disease Mother    Social History  Substance Use Topics  . Smoking status: Never Smoker   . Smokeless tobacco: Never Used  . Alcohol Use: No    Review of Systems  Constitutional: Negative for fever and chills.  HENT: Negative.   Respiratory: Negative.  Negative for shortness of breath.   Cardiovascular: Negative.  Negative for chest pain.  Gastrointestinal: Positive for vomiting. Negative for abdominal pain and diarrhea.  Musculoskeletal: Negative.  Negative for myalgias.  Skin: Negative.  Negative for wound.  Neurological: Positive for seizures. Negative for headaches.      Allergies  Coconut flavor; Codeine; Penicillins; and Tape  Home Medications   Prior to Admission medications   Medication Sig Start  Date End Date Taking? Authorizing Provider  divalproex (DEPAKOTE) 500 MG DR tablet Take 500 mg by mouth 2 (two) times daily.    Historical Provider, MD  FLUoxetine (PROZAC) 40 MG capsule Take 40 mg by mouth daily.    Historical Provider, MD  HYDROcodone-acetaminophen (NORCO/VICODIN) 5-325 MG tablet Take 1 tablet by mouth every 4 (four) hours as needed. Patient not taking: Reported on 07/05/2015 06/26/15   Tanna Furry, MD  lamoTRIgine (LAMICTAL) 100 MG tablet Take 2 tablets (200 mg total) by mouth at bedtime. Patient not taking: Reported on 08/13/2015 05/10/15   Kristen N Ward, DO  methocarbamol (ROBAXIN) 500 MG tablet Take 1 tablet (500 mg total) by mouth 2 (two) times daily. Patient not taking: Reported on 08/13/2015 08/06/15   Larene Pickett, PA-C  naproxen (NAPROSYN) 500 MG tablet Take 1 tablet (500 mg total) by mouth 2 (two) times daily. Patient not taking: Reported on 08/13/2015 07/30/15   Olivia Canter [place], PA-C  permethrin (ELIMITE) 5 % cream Apply to affected area once Patient not taking: Reported on 07/04/2015 05/10/15   Delice Bison Ward, DO  QUEtiapine (SEROQUEL XR) 50 MG TB24 24 hr tablet Take 50 mg by mouth at bedtime.    Historical Provider, MD   BP 140/78 mmHg  Pulse 61  Temp(Src) 98.8 F (37.1 C) (Oral)  SpO2 97% Physical Exam  Constitutional: He is oriented to person, place, and time. He appears well-developed and well-nourished.  HENT:  Head: Normocephalic.  Neck: Normal range of motion. Neck supple.  Cardiovascular: Normal  rate and regular rhythm.   Pulmonary/Chest: Effort normal and breath sounds normal.  Abdominal: Soft. Bowel sounds are normal. There is no tenderness. There is no rebound and no guarding.  Musculoskeletal: Normal range of motion.  Neurological: He is alert and oriented to person, place, and time.  Awake, alert. Fine motor skills intact, normal coordination. CN's 3-12 intact. Speech clear and focused.   Skin: Skin is warm and dry. No rash noted.  Psychiatric: He has a  normal mood and affect.    ED Course  Procedures (including critical care time) Labs Review Labs Reviewed  VALPROIC ACID LEVEL    Imaging Review No results found. I have personally reviewed and evaluated these images and lab results as part of my medical decision-making.   EKG Interpretation None      MDM   Final diagnoses:  None    1. Seizure disorder  Depakote level pending. Will observe in the ED for seizure activity.   Patient care signed out to [facility], PA-C, pending Valproic Acid level and re-evaluation for seizure activity.  Charlann Lange, PA-C 08/19/15 [place], MD 08/20/15 [phone]

## 2015-08-19 NOTE — ED Notes (Signed)
Bed: [facility] Expected date:  Expected time:  Means of arrival:  Comments: EMS-SZ

## 2015-08-22 ENCOUNTER — Emergency Department (HOSPITAL_COMMUNITY)
Admission: EM | Admit: 2015-08-22 | Discharge: 2015-08-23 | Disposition: A | Discharge status: Home / Self Care | Payer: Medicaid Other | Attending: Emergency Medicine | Admitting: Emergency Medicine

## 2015-08-22 ENCOUNTER — Encounter (HOSPITAL_COMMUNITY): Payer: Self-pay

## 2015-08-22 DIAGNOSIS — F209 Schizophrenia, unspecified: Secondary | ICD-10-CM | POA: Insufficient documentation

## 2015-08-22 DIAGNOSIS — R51 Headache: Secondary | ICD-10-CM | POA: Insufficient documentation

## 2015-08-22 DIAGNOSIS — R001 Bradycardia, unspecified: Secondary | ICD-10-CM | POA: Insufficient documentation

## 2015-08-22 DIAGNOSIS — F319 Bipolar disorder, unspecified: Secondary | ICD-10-CM | POA: Insufficient documentation

## 2015-08-22 DIAGNOSIS — Z880 Allergy status to penicillin: Secondary | ICD-10-CM | POA: Diagnosis not present

## 2015-08-22 DIAGNOSIS — Z79899 Other long term (current) drug therapy: Secondary | ICD-10-CM | POA: Insufficient documentation

## 2015-08-22 DIAGNOSIS — R569 Unspecified convulsions: Secondary | ICD-10-CM | POA: Diagnosis present

## 2015-08-22 LAB — BASIC METABOLIC PANEL
Anion gap: 9 (ref 5–15)
BUN: 14 mg/dL (ref 6–20)
CALCIUM: 9.2 mg/dL (ref 8.9–10.3)
CO2: 24 mmol/L (ref 22–32)
CREATININE: 0.81 mg/dL (ref 0.61–1.24)
Chloride: 106 mmol/L (ref 101–111)
GFR calc Af Amer: >60 mL/min (ref >60)
GLUCOSE: 85 mg/dL (ref 65–99)
Potassium: 3.8 mmol/L (ref 3.5–5.1)
SODIUM: 139 mmol/L (ref 135–145)

## 2015-08-22 NOTE — ED Provider Notes (Signed)
CSN: [id number]     Arrival date & time 08/22/15  2015 History   First MD Initiated Contact with Patient 08/22/15 2043     Chief Complaint  Patient presents with  . Seizures     (Consider location/radiation/quality/duration/timing/severity/associated sxs/prior Treatment) Patient is a 35 y.o. male presenting with seizures. The history is provided by the patient and a significant other (girlfriend via telephone).  Seizures Seizure activity on arrival: no   Seizure type:  Grand mal Preceding symptoms: headache   Initial focality:  None Episode characteristics: generalized shaking   Postictal symptoms: confusion   Return to baseline: yes   Severity:  Severe Duration:  5 minutes Timing:  Once Progression:  Resolved Recent head injury:  No recent head injuries PTA treatment:  None History of seizures: yes   Similar to previous episodes: yes     Past Medical History  Diagnosis Date  . Depression   . Seizures ([place])   . Bipolar 1 disorder ([place])   . Schizophrenic disorder ([place])    History reviewed. No pertinent past surgical history. Family History  Problem Relation Age of Onset  . Heart disease Mother    Social History  Substance Use Topics  . Smoking status: Never Smoker   . Smokeless tobacco: Never Used  . Alcohol Use: No    Review of Systems  Constitutional: Negative for fever, diaphoresis, activity change and appetite change.  HENT: Negative for facial swelling, sore throat, tinnitus, trouble swallowing and voice change.   Eyes: Negative for pain, redness and visual disturbance.  Respiratory: Negative for chest tightness, shortness of breath and wheezing.   Cardiovascular: Negative for chest pain, palpitations and leg swelling.  Gastrointestinal: Negative for nausea, vomiting, abdominal pain, diarrhea, constipation and abdominal distention.  Endocrine: Negative.   Genitourinary: Negative.  Negative for dysuria, decreased urine volume, scrotal swelling and testicular  pain.  Musculoskeletal: Negative for myalgias, back pain and gait problem.  Skin: Negative.  Negative for rash.  Neurological: Positive for seizures and headaches (had headache before seizure but now it is resolved). Negative for dizziness, tremors and weakness.  Psychiatric/Behavioral: Negative for suicidal ideas, hallucinations and self-injury. The patient is not nervous/anxious.       Allergies  Coconut flavor; Codeine; Penicillins; Depakote; and Tape  Home Medications   Prior to Admission medications   Medication Sig Start Date End Date Taking? Authorizing Provider  acetaminophen (TYLENOL) 500 MG tablet Take 1,000 mg by mouth daily as needed for mild pain or headache.   Yes Historical Provider, MD  lamoTRIgine (LAMICTAL) 100 MG tablet Take 2 tablets (200 mg total) by mouth at bedtime. Patient taking differently: Take 100 mg by mouth 2 (two) times daily.  05/10/15  Yes Kristen N Ward, DO  QUEtiapine (SEROQUEL XR) 50 MG TB24 24 hr tablet Take 50 mg by mouth at bedtime.   Yes Historical Provider, MD  levETIRAcetam (KEPPRA) 500 MG tablet Take 1 tablet (500 mg total) by mouth 2 (two) times daily. 08/23/15   Margaretann Loveless, MD   BP 119/72 mmHg  Pulse 52  Resp 15  SpO2 96% Physical Exam  Constitutional: He is oriented to person, place, and time. He appears well-developed and well-nourished. No distress.  HENT:  Head: Normocephalic and atraumatic.  Right Ear: External ear normal.  Left Ear: External ear normal.  Nose: Nose normal.  Eyes: Conjunctivae and EOM are normal. Pupils are equal, round, and reactive to light. No scleral icterus.  Neck: Normal range of motion. Neck supple. No  JVD present. No tracheal deviation present. No thyromegaly present.  Cardiovascular: Intact distal pulses.   Regular bradycardic rhythm in high 50's  Pulmonary/Chest: Effort normal and breath sounds normal. No stridor. No respiratory distress. He has no wheezes. He has no rales.  Abdominal: Soft. He exhibits  no distension. There is no tenderness. There is no rebound and no guarding.  Musculoskeletal: Normal range of motion. He exhibits no edema or tenderness.  Neurological: He is alert and oriented to person, place, and time. No cranial nerve deficit. He exhibits normal muscle tone. Coordination normal.  5/5 strength in all 4 extremities. Normal Gait.   Skin: Skin is warm and dry. No rash noted. He is not diaphoretic.  Psychiatric: He has a normal mood and affect. His behavior is normal.  Nursing note and vitals reviewed.   ED Course  Procedures (including critical care time) Labs Review Labs Reviewed  BASIC METABOLIC PANEL  CBG MONITORING, ED    Imaging Review No results found. I have personally reviewed and evaluated these images and lab results as part of my medical decision-making.   EKG Interpretation None      MDM   Final diagnoses:  Seizure [facility])    The patient is a 35 year old male with a history of bipolar affective disorder and seizures. He was recently taken off of Depakote due to it causing confusion in him. He presents today after a witnessed generalized tonic clonic seizure without any preceding infectious or traumatic symptoms. I spoke with the patient's girlfriend via telephone who is the entire seizure and provides additional history. Arrival to the emergency department the patient is completely at neurologic baseline. EKG and BMP unremarkable. I have spoken to Dr. Nicole Kindred with neurology and reviewed the case with him. He recommends a 1 g of Keppra load and then starting the patient on twice a day Keppra. Patient will follow up with our neurology in 2 weeks. Patient given standard seizure precautions including avoiding driving, swimming, or other positions until cleared by neurology. Patient expresses understanding and agreement with this plan and is discharged home with standard ED return precautions.  Patient seen with attending, Dr. Audie Pinto, who oversaw clinical  decision making.     Margaretann Loveless, MD 08/23/15 [id number]  Leonard Schwartz, MD 08/23/15 [phone]

## 2015-08-22 NOTE — ED Notes (Signed)
Pt arrived via [facility] from home c/o witnessed grand mal seizure lasting 1-2 minutes.  Pt was in bed denies any injuries or pain.  Pt states he is at baseline now.

## 2015-08-23 MED ORDER — SODIUM CHLORIDE 0.9 % IV SOLN
1000.0000 mg | Freq: Once | INTRAVENOUS | Status: AC
  Administered 2015-08-23: 1000 mg via INTRAVENOUS
  Filled 2015-08-23: qty 10

## 2015-08-23 MED ORDER — LEVETIRACETAM 500 MG PO TABS: 500.0000 mg | ORAL_TABLET | Freq: Two times a day (BID) | ORAL | Status: DC

## 2015-08-23 NOTE — ED Provider Notes (Signed)
ED ECG REPORT   Date: 08/23/2015  Rate: 55  Rhythm: sinus bradycardia  QRS Axis: normal  Intervals: normal  ST/T Wave abnormalities: normal  Conduction Disutrbances:nonspecific intraventricular conduction delay  Narrative Interpretation:   Old EKG Reviewed: unchanged form 04/11/15  I have personally reviewed the EKG tracing and agree with the computerized printout as noted.  I had no contact with this patient. Interpreted EKG only  Orlie Dakin, MD 08/23/15 [phone]

## 2015-08-23 NOTE — Discharge Instructions (Signed)
Seizure, Adult °A seizure means there is unusual activity in the brain. A seizure can cause changes in attention or behavior. Seizures often cause shaking (convulsions). Seizures often last from 30 seconds to 2 minutes. °HOME CARE  °· If you are given medicines, take them exactly as told by your doctor. °· Keep all doctor visits as told. °· Do not swim or drive until your doctor says it is okay. °· Teach others what to do if you have a seizure. They should: °¨ Lay you on the ground. °¨ Put a cushion under your head. °¨ Loosen any tight clothing around your neck. °¨ Turn you on your side. °¨ Stay with you until you get better. °GET HELP RIGHT AWAY IF:  °· The seizure lasts longer than 2 to 5 minutes. °· The seizure is very bad. °· The person does not wake up after the seizure. °· The person's attention or behavior changes. °Drive the person to the emergency room or call your local emergency services (911 in [place] °MAKE SURE YOU:  °· Understand these instructions. °· Will watch your condition. °· Will get help right away if you are not doing well or get worse. °  °This information is not intended to replace advice given to you by your health care provider. Make sure you discuss any questions you have with your health care provider. °  °Document Released: 11/03/2007 Document Revised: 08/09/2011 Document Reviewed: 12/27/2012 °Elsevier Interactive Patient Education ©2016 Elsevier Inc. ° °

## 2015-08-26 ENCOUNTER — Inpatient Hospital Stay: Payer: Self-pay | Admitting: Internal Medicine

## 2015-09-11 DIAGNOSIS — Z79899 Other long term (current) drug therapy: Secondary | ICD-10-CM | POA: Diagnosis not present

## 2015-09-11 DIAGNOSIS — G40909 Epilepsy, unspecified, not intractable, without status epilepticus: Secondary | ICD-10-CM | POA: Insufficient documentation

## 2015-09-11 DIAGNOSIS — Y9389 Activity, other specified: Secondary | ICD-10-CM | POA: Insufficient documentation

## 2015-09-11 DIAGNOSIS — F209 Schizophrenia, unspecified: Secondary | ICD-10-CM | POA: Diagnosis not present

## 2015-09-11 DIAGNOSIS — X58XXXA Exposure to other specified factors, initial encounter: Secondary | ICD-10-CM | POA: Insufficient documentation

## 2015-09-11 DIAGNOSIS — F319 Bipolar disorder, unspecified: Secondary | ICD-10-CM | POA: Insufficient documentation

## 2015-09-11 DIAGNOSIS — S90512A Abrasion, left ankle, initial encounter: Secondary | ICD-10-CM | POA: Diagnosis not present

## 2015-09-11 DIAGNOSIS — Y998 Other external cause status: Secondary | ICD-10-CM | POA: Insufficient documentation

## 2015-09-11 DIAGNOSIS — Y9289 Other specified places as the place of occurrence of the external cause: Secondary | ICD-10-CM | POA: Insufficient documentation

## 2015-09-11 DIAGNOSIS — Z880 Allergy status to penicillin: Secondary | ICD-10-CM | POA: Insufficient documentation

## 2015-09-11 DIAGNOSIS — R569 Unspecified convulsions: Secondary | ICD-10-CM | POA: Diagnosis present

## 2015-09-12 ENCOUNTER — Encounter (HOSPITAL_COMMUNITY): Payer: Self-pay

## 2015-09-12 ENCOUNTER — Emergency Department (HOSPITAL_COMMUNITY)
Admission: EM | Admit: 2015-09-12 | Discharge: 2015-09-12 | Disposition: A | Discharge status: Home / Self Care | Payer: Medicaid Other | Attending: Emergency Medicine | Admitting: Emergency Medicine

## 2015-09-12 DIAGNOSIS — G40909 Epilepsy, unspecified, not intractable, without status epilepticus: Secondary | ICD-10-CM

## 2015-09-12 DIAGNOSIS — T148XXA Other injury of unspecified body region, initial encounter: Secondary | ICD-10-CM

## 2015-09-12 LAB — I-STAT CHEM 8, ED
BUN: 17 mg/dL (ref 6–20)
CALCIUM ION: 1.18 mmol/L (ref 1.12–1.23)
CREATININE: 0.8 mg/dL (ref 0.61–1.24)
Chloride: 100 mmol/L — ABNORMAL LOW (ref 101–111)
GLUCOSE: 92 mg/dL (ref 65–99)
HCT: 44 % (ref 39.0–52.0)
Hemoglobin: 15 g/dL (ref 13.0–17.0)
POTASSIUM: 3.5 mmol/L (ref 3.5–5.1)
Sodium: 140 mmol/L (ref 135–145)
TCO2: 27 mmol/L (ref 0–100)

## 2015-09-12 LAB — CBG MONITORING, ED: Glucose-Capillary: 94 mg/dL (ref 65–99)

## 2015-09-12 MED ORDER — SODIUM CHLORIDE 0.9 % IV SOLN
1000.0000 mg | Freq: Once | INTRAVENOUS | Status: AC
  Administered 2015-09-12: 1000 mg via INTRAVENOUS
  Filled 2015-09-12: qty 10

## 2015-09-12 MED ORDER — LEVETIRACETAM 500 MG PO TABS: 500.0000 mg | ORAL_TABLET | Freq: Two times a day (BID) | ORAL | Status: DC

## 2015-09-12 NOTE — ED Provider Notes (Signed)
CSN: [id number]     Arrival date & time 09/11/15  2357 History   First MD Initiated Contact with Patient 09/12/15 [phone]     Chief Complaint  Patient presents with  . Insect Bite  . Seizures     (Consider location/radiation/quality/duration/timing/severity/associated sxs/prior Treatment) Patient is a 35 y.o. male presenting with seizures. The history is provided by the patient.  Seizures Seizure activity on arrival: no   Seizure type:  Grand mal Preceding symptoms: no sensation of an aura present   Initial focality:  None Episode characteristics: generalized shaking   Return to baseline: yes   Severity:  Mild Duration: unable because patient reports but there were no witnesses. Progression:  Resolved Context: medical non-compliance   Context: not alcohol withdrawal   Recent head injury:  No recent head injuries PTA treatment:  None History of seizures: yes     Past Medical History  Diagnosis Date  . Depression   . Seizures ([place])   . Bipolar 1 disorder ([place])   . Schizophrenic disorder ([place])    History reviewed. No pertinent past surgical history. Family History  Problem Relation Age of Onset  . Heart disease Mother    Social History  Substance Use Topics  . Smoking status: Never Smoker   . Smokeless tobacco: Never Used  . Alcohol Use: No    Review of Systems  Neurological: Positive for seizures.  All other systems reviewed and are negative.     Allergies  Coconut flavor; Codeine; Penicillins; Depakote; and Tape  Home Medications   Prior to Admission medications   Medication Sig Start Date End Date Taking? Authorizing Provider  acetaminophen (TYLENOL) 500 MG tablet Take 1,000 mg by mouth daily as needed for mild pain or headache.    Historical Provider, MD  lamoTRIgine (LAMICTAL) 100 MG tablet Take 2 tablets (200 mg total) by mouth at bedtime. Patient taking differently: Take 100 mg by mouth 2 (two) times daily.  05/10/15   Kristen N Ward, DO   levETIRAcetam (KEPPRA) 500 MG tablet Take 1 tablet (500 mg total) by mouth 2 (two) times daily. 08/23/15   Margaretann Loveless, MD  QUEtiapine (SEROQUEL XR) 50 MG TB24 24 hr tablet Take 50 mg by mouth at bedtime.    Historical Provider, MD   BP 117/80 mmHg  Pulse 53  Temp(Src) 97.9 F (36.6 C) (Oral)  Resp 16  SpO2 98% Physical Exam  Constitutional: He is oriented to person, place, and time. He appears well-developed and well-nourished. No distress.  HENT:  Head: Normocephalic and atraumatic. Head is without raccoon's eyes and without Battle's sign.  Mouth/Throat: Oropharynx is clear and moist.  Eyes: Pupils are equal, round, and reactive to light.  Neck: Normal range of motion. Neck supple.  Cardiovascular: Normal rate, regular rhythm and intact distal pulses.   Pulmonary/Chest: Effort normal and breath sounds normal. No respiratory distress. He has no wheezes. He has no rales.  Abdominal: Soft. Bowel sounds are normal. There is no tenderness. There is no rebound and no guarding.  Musculoskeletal: Normal range of motion.  Neurological: He is alert and oriented to person, place, and time. He has normal reflexes. No cranial nerve deficit.  Skin: Skin is warm and dry.  Abrasion to the left medial ankle not an insect bite    ED Course  Procedures (including critical care time) Labs Review Labs Reviewed  CBG MONITORING, ED  I-STAT CHEM 8, ED    Imaging Review No results found. I have personally reviewed and  evaluated these images and lab results as part of my medical decision-making.   EKG Interpretation None      MDM   Final diagnoses:  None   Results for orders placed or performed during the hospital encounter of 09/12/15  I-stat chem 8, ed  Result Value Ref Range   Sodium 140 135 - 145 mmol/L   Potassium 3.5 3.5 - 5.1 mmol/L   Chloride 100 (L) 101 - 111 mmol/L   BUN 17 6 - 20 mg/dL   Creatinine, Ser 0.80 0.61 - 1.24 mg/dL   Glucose, Bld 92 65 - 99 mg/dL   Calcium, Ion  1.18 1.12 - 1.23 mmol/L   TCO2 27 0 - 100 mmol/L   Hemoglobin 15.0 13.0 - 17.0 g/dL   HCT 44.0 39.0 - 52.0 %   No results found.  Medications  levETIRAcetam (KEPPRA) 1,000 mg in sodium chloride 0.9 % 100 mL IVPB (1,000 mg Intravenous New Bag/Given 09/12/15 0505)   Filed Vitals:   09/12/15 0445 09/12/15 0515  BP: 113/76 115/65  Pulse: 95 42  Temp:    Resp: 16    Saturating 98% on RA.    Patient refused EKG   Well appearing, stable vital signs.  Exam is benign and reassuring.  No signs of head trauma.  No indication for imaging at this time.    No driving until cleared by neurology.  Take your keppra as directed.  Keppra RX given.      Veatrice Kells, MD 09/12/15 [phone]

## 2015-09-12 NOTE — Discharge Instructions (Signed)
Epilepsy °Epilepsy is a disorder in which a person has repeated seizures over time. A seizure is a release of abnormal electrical activity in the brain. Seizures can cause a change in attention, behavior, or the ability to remain awake and alert (altered mental status). Seizures often involve uncontrollable shaking (convulsions).  °Most people with epilepsy lead normal lives. However, people with epilepsy are at an increased risk of falls, accidents, and injuries. Therefore, it is important to begin treatment right away. °CAUSES  °Epilepsy has many possible causes. Anything that disturbs the normal pattern of brain cell activity can lead to seizures. This may include:  °· Head injury. °· Birth trauma. °· High fever as a child. °· Stroke. °· Bleeding into or around the brain. °· Certain drugs. °· Prolonged low oxygen, such as what occurs after CPR efforts. °· Abnormal brain development. °· Certain illnesses, such as meningitis, encephalitis (brain infection), malaria, and other infections. °· An imbalance of nerve signaling chemicals (neurotransmitters).   °SIGNS AND SYMPTOMS  °The symptoms of a seizure can vary greatly from one person to another. Right before a seizure, you may have a warning (aura) that a seizure is about to occur. An aura may include the following symptoms: °· Fear or anxiety. °· Nausea. °· Feeling like the room is spinning (vertigo). °· Vision changes, such as seeing flashing lights or spots. °Common symptoms during a seizure include: °· Abnormal sensations, such as an abnormal smell or a bitter taste in the mouth.   °· Sudden, general body stiffness.   °· Convulsions that involve rhythmic jerking of the face, arm, or leg on one or both sides.   °· Sudden change in consciousness.   °¨ Appearing to be awake but not responding.   °¨ Appearing to be asleep but cannot be awakened.   °· Grimacing, chewing, lip smacking, drooling, tongue biting, or loss of bowel or bladder control. °After a seizure,  you may feel sleepy for a while.  °DIAGNOSIS  °Your health care provider will ask about your symptoms and take a medical history. Descriptions from any witnesses to your seizures will be very helpful in the diagnosis. A physical exam, including a detailed neurological exam, is necessary. Various tests may be done, such as:  °· An electroencephalogram (EEG). This is a painless test of your brain waves. In this test, a diagram is created of your brain waves. These diagrams can be interpreted by a specialist. °· An MRI of the brain.   °· A CT scan of the brain.   °· A spinal tap (lumbar puncture, LP). °· Blood tests to check for signs of infection or abnormal blood chemistry. °TREATMENT  °There is no cure for epilepsy, but it is generally treatable. Once epilepsy is diagnosed, it is important to begin treatment as soon as possible. For most people with epilepsy, seizures can be controlled with medicines. The following may also be used: °· A pacemaker for the brain (vagus nerve stimulator) can be used for people with seizures that are not well controlled by medicine. °· Surgery on the brain. °For some people, epilepsy eventually goes away. °HOME CARE INSTRUCTIONS  °· Follow your health care provider's recommendations on driving and safety in normal activities. °· Get enough rest. Lack of sleep can cause seizures. °· Only take over-the-counter or prescription medicines as directed by your health care provider. Take any prescribed medicine exactly as directed. °· Avoid any known triggers of your seizures. °· Keep a seizure diary. Record what you recall about any seizure, especially any possible trigger.   °· Make   sure the people you live and work with know that you are prone to seizures. They should receive instructions on how to help you. In general, a witness to a seizure should:   Cushion your head and body.   Turn you on your side.   Avoid unnecessarily restraining you.   Not place anything inside your  mouth.   Call for emergency medical help if there is any question about what has occurred.   Follow up with your health care provider as directed. You may need regular blood tests to monitor the levels of your medicine.  SEEK MEDICAL CARE IF:   You develop signs of infection or other illness. This might increase the risk of a seizure.   You seem to be having more frequent seizures.   Your seizure pattern is changing.  SEEK IMMEDIATE MEDICAL CARE IF:   You have a seizure that does not stop after a few moments.   You have a seizure that causes any difficulty in breathing.   You have a seizure that results in a very severe headache.   You have a seizure that leaves you with the inability to speak or use a part of your body.    This information is not intended to replace advice given to you by your health care provider. Make sure you discuss any questions you have with your health care provider.   Document Released: 05/17/2005 Document Revised: 03/07/2013 Document Reviewed: 12/27/2012 Elsevier Interactive Patient Education 2016 [employer].   No driving until cleared by neurology

## 2015-09-12 NOTE — ED Notes (Signed)
Pt reports he has insect bite to right lower leg. He also reports he has had three seizures "back to back," last seizure was 45 minutes ago. Hx of seizures. Pt A&Ox4, does not appear post ictal, ambulatory with steady gait speaking clear complete sentences.

## 2015-09-12 NOTE — ED Notes (Signed)
Pt refused EKG. EDP aware

## 2015-09-12 NOTE — ED Notes (Signed)
Attempted to perform EKG on pt pt states "I refuse" RN aware

## 2015-09-22 ENCOUNTER — Ambulatory Visit: Payer: Self-pay | Admitting: Neurology

## 2015-09-25 ENCOUNTER — Ambulatory Visit (HOSPITAL_COMMUNITY)
Admission: EM | Admit: 2015-09-25 | Discharge: 2015-09-25 | Disposition: A | Discharge status: Home / Self Care | Payer: Medicaid Other | Attending: Emergency Medicine | Admitting: Emergency Medicine

## 2015-09-25 ENCOUNTER — Encounter (HOSPITAL_COMMUNITY): Payer: Self-pay | Admitting: Emergency Medicine

## 2015-09-25 ENCOUNTER — Emergency Department (HOSPITAL_COMMUNITY)
Admission: EM | Admit: 2015-09-25 | Discharge: 2015-09-25 | Disposition: A | Discharge status: Home / Self Care | Payer: Medicaid Other | Attending: Emergency Medicine | Admitting: Emergency Medicine

## 2015-09-25 ENCOUNTER — Encounter (HOSPITAL_COMMUNITY): Payer: Self-pay | Admitting: *Deleted

## 2015-09-25 DIAGNOSIS — R1111 Vomiting without nausea: Secondary | ICD-10-CM

## 2015-09-25 DIAGNOSIS — R059 Cough, unspecified: Secondary | ICD-10-CM

## 2015-09-25 DIAGNOSIS — R05 Cough: Secondary | ICD-10-CM

## 2015-09-25 DIAGNOSIS — J302 Other seasonal allergic rhinitis: Secondary | ICD-10-CM

## 2015-09-25 DIAGNOSIS — R109 Unspecified abdominal pain: Secondary | ICD-10-CM | POA: Insufficient documentation

## 2015-09-25 LAB — COMPREHENSIVE METABOLIC PANEL
ALT: 18 U/L (ref 17–63)
AST: 22 U/L (ref 15–41)
Albumin: 4.2 g/dL (ref 3.5–5.0)
Alkaline Phosphatase: 62 U/L (ref 38–126)
Anion gap: 8 (ref 5–15)
BUN: 5 mg/dL — AB (ref 6–20)
CHLORIDE: 104 mmol/L (ref 101–111)
CO2: 27 mmol/L (ref 22–32)
Calcium: 9.5 mg/dL (ref 8.9–10.3)
Creatinine, Ser: 0.94 mg/dL (ref 0.61–1.24)
Glucose, Bld: 87 mg/dL (ref 65–99)
POTASSIUM: 4 mmol/L (ref 3.5–5.1)
SODIUM: 139 mmol/L (ref 135–145)
Total Bilirubin: 1.7 mg/dL — ABNORMAL HIGH (ref 0.3–1.2)
Total Protein: 7.1 g/dL (ref 6.5–8.1)

## 2015-09-25 LAB — LIPASE, BLOOD: LIPASE: 20 U/L (ref 11–51)

## 2015-09-25 LAB — CBC
HEMATOCRIT: 42.3 % (ref 39.0–52.0)
Hemoglobin: 14.5 g/dL (ref 13.0–17.0)
MCH: 30.1 pg (ref 26.0–34.0)
MCHC: 34.3 g/dL (ref 30.0–36.0)
MCV: 87.8 fL (ref 78.0–100.0)
Platelets: 173 10*3/uL (ref 150–400)
RBC: 4.82 MIL/uL (ref 4.22–5.81)
RDW: 12.9 % (ref 11.5–15.5)
WBC: 9.7 10*3/uL (ref 4.0–10.5)

## 2015-09-25 MED ORDER — FLUTICASONE PROPIONATE 50 MCG/ACT NA SUSP: 2.0000 | Freq: Every day | NASAL | Status: DC

## 2015-09-25 MED ORDER — ALBUTEROL SULFATE HFA 108 (90 BASE) MCG/ACT IN AERS: 1.0000 | INHALATION_SPRAY | Freq: Four times a day (QID) | RESPIRATORY_TRACT | Status: DC | PRN

## 2015-09-25 MED ORDER — ONDANSETRON 4 MG PO TBDP
ORAL_TABLET | ORAL | Status: AC
  Filled 2015-09-25: qty 1

## 2015-09-25 MED ORDER — AEROCHAMBER PLUS MISC
Status: DC
Start: 2015-09-25 — End: 2015-11-30

## 2015-09-25 MED ORDER — LORATADINE 10 MG PO TABS: 10.0000 mg | ORAL_TABLET | Freq: Every day | ORAL | Status: DC

## 2015-09-25 MED ORDER — ONDANSETRON HCL 4 MG PO TABS: 4.0000 mg | ORAL_TABLET | Freq: Four times a day (QID) | ORAL | Status: DC

## 2015-09-25 MED ORDER — BENZONATATE 100 MG PO CAPS: 100.0000 mg | ORAL_CAPSULE | Freq: Three times a day (TID) | ORAL | Status: DC

## 2015-09-25 MED ORDER — ONDANSETRON 4 MG PO TBDP
4.0000 mg | ORAL_TABLET | Freq: Once | ORAL | Status: AC
  Administered 2015-09-25: 4 mg via ORAL

## 2015-09-25 NOTE — ED Provider Notes (Signed)
HPI  SUBJECTIVE:  Franklin [name] is a 35 y.o. male who presents with occasional emesis 30 minutes after eating starting yesterday. He denies abdominal pain or vomiting at any other time. Patient states that he is tolerating some by mouth. He had a normal bowel movement this morning. No change in urine output, urinary complaints. No abdominal distention. No water brash, burning chest pain, belching, lightheadedness, syncope. No change in meds, no new foods. No sick contacts. No antipyretic in the past 6-8 hours. Symptoms are worse with eating, better with drinking ginger ale. He has not tried anything else for this. Patient states that he has had similar symptoms before, which was treated successfully with Zofran. Patient also reports a dry cough starting last night. States he can't sleep secondary to cough. Reports abdominal soreness from coughing. He reports nasal congestion, itchy, watery eyes, sneezing. Symptoms are worse with going outside into the pollen and, better with drinking water. He has not tried anything else for this.No chest pain, wheezing, shortness of breath, fevers. Denies postnasal drip. Past medical history of seasonal allergies, asthma, bipolar, schizophrenia, patient states he is diabetic, but does not need medications, seizures. No history of hypertension, GERD, abdominal surgeries. He is not a smoker. patient states that he does not have an albuterol inhaler. PMD: Dr. Feliciana Rossetti.    Past Medical History  Diagnosis Date  . Depression   . Seizures ([place])   . Bipolar 1 disorder ([place])   . Schizophrenic disorder ([place])     History reviewed. No pertinent past surgical history.  Family History  Problem Relation Age of Onset  . Heart disease Mother     Social History  Substance Use Topics  . Smoking status: Never Smoker   . Smokeless tobacco: Never Used  . Alcohol Use: No    No current facility-administered medications for this encounter.  Current outpatient prescriptions:   .  acetaminophen (TYLENOL) 500 MG tablet, Take 1,000 mg by mouth daily as needed for mild pain or headache., Disp: , Rfl:  .  albuterol (PROVENTIL HFA;VENTOLIN HFA) 108 (90 Base) MCG/ACT inhaler, Inhale 1-2 puffs into the lungs every 6 (six) hours as needed for wheezing or shortness of breath., Disp: 1 Inhaler, Rfl: 0 .  benzonatate (TESSALON) 100 MG capsule, Take 1 capsule (100 mg total) by mouth every 8 (eight) hours., Disp: 21 capsule, Rfl: 0 .  fluticasone (FLONASE) 50 MCG/ACT nasal spray, Place 2 sprays into both nostrils daily., Disp: 16 g, Rfl: 0 .  levETIRAcetam (KEPPRA) 500 MG tablet, Take 1 tablet (500 mg total) by mouth 2 (two) times daily., Disp: 56 tablet, Rfl: 0 .  levETIRAcetam (KEPPRA) 500 MG tablet, Take 1 tablet (500 mg total) by mouth 2 (two) times daily., Disp: 60 tablet, Rfl: 0 .  loratadine (CLARITIN) 10 MG tablet, Take 1 tablet (10 mg total) by mouth daily., Disp: 30 tablet, Rfl: 0 .  ondansetron (ZOFRAN) 4 MG tablet, Take 1 tablet (4 mg total) by mouth every 6 (six) hours., Disp: 20 tablet, Rfl: 0 .  QUEtiapine (SEROQUEL XR) 50 MG TB24 24 hr tablet, Take 50 mg by mouth at bedtime., Disp: , Rfl:  .  Spacer/Aero-Holding Chambers (AEROCHAMBER PLUS) inhaler, Use as instructed, Disp: 1 each, Rfl: 2  Allergies  Allergen Reactions  . Coconut Flavor Nausea And Vomiting  . Codeine Nausea And Vomiting  . Penicillins Nausea And Vomiting    Has patient had a PCN reaction causing immediate rash, facial/tongue/throat swelling, SOB or lightheadedness with hypotension: No Has  patient had a PCN reaction causing severe rash involving mucus membranes or skin necrosis: No Has patient had a PCN reaction that required hospitalization No Has patient had a PCN reaction occurring within the last 10 years: No If all of the above answers are "NO", then may proceed with Cephalosporin use.    . Depakote [Divalproex Sodium] Other (See Comments)    Out of it, spaced out, didn't know where he was   . Tape Other (See Comments)    Tears skin off, Please use "paper" tape     ROS  As noted in HPI.   Physical Exam  BP 121/79 mmHg  Pulse 71  Temp(Src) 99.1 F (37.3 C) (Oral)  Resp 16  SpO2 100%  Constitutional: Well developed, well nourished, no acute distressppears well-hydrated Eyes: PERRL, EOMI, conjunctiva normal bilaterally HENT: Normocephalic, atraumatic,mucus membranes moist. + clear nasal congestion, no sinus tenderness.  Respiratory: Clear to auscultation bilaterally, no rales, no wheezing, no rhonchi. Good air movement Cardiovascular: Normal rate and rhythm, no murmurs, no gallops, no rubs GI: normal appearance, Soft, nondistended, normal bowel sounds, nontender, no rebound, no guarding Back: no CVAT skin: No rash, skin intact Musculoskeletal: No edema, no tenderness, no deformities Neurologic: Alert & oriented x 3, CN II-XII grossly intact, no motor deficits, sensation grossly intact Psychiatric: Speech and behavior appropriate   ED Course   Medications - No data to display  No orders of the defined types were placed in this encounter.   Results for orders placed or performed during the hospital encounter of 09/25/15 (from the past 24 hour(s))  Lipase, blood     Status: None   Collection Time: 09/25/15  1:19 PM  Result Value Ref Range   Lipase 20 11 - 51 U/L  Comprehensive metabolic panel     Status: Abnormal   Collection Time: 09/25/15  1:19 PM  Result Value Ref Range   Sodium 139 135 - 145 mmol/L   Potassium 4.0 3.5 - 5.1 mmol/L   Chloride 104 101 - 111 mmol/L   CO2 27 22 - 32 mmol/L   Glucose, Bld 87 65 - 99 mg/dL   BUN 5 (L) 6 - 20 mg/dL   Creatinine, Ser 0.94 0.61 - 1.24 mg/dL   Calcium 9.5 8.9 - 10.3 mg/dL   Total Protein 7.1 6.5 - 8.1 g/dL   Albumin 4.2 3.5 - 5.0 g/dL   AST 22 15 - 41 U/L   ALT 18 17 - 63 U/L   Alkaline Phosphatase 62 38 - 126 U/L   Total Bilirubin 1.7 (H) 0.3 - 1.2 mg/dL   GFR calc non Af Amer >60 >60 mL/min   GFR calc  Af Amer >60 >60 mL/min   Anion gap 8 5 - 15  CBC     Status: None   Collection Time: 09/25/15  1:19 PM  Result Value Ref Range   WBC 9.7 4.0 - 10.5 K/uL   RBC 4.82 4.22 - 5.81 MIL/uL   Hemoglobin 14.5 13.0 - 17.0 g/dL   HCT 42.3 39.0 - 52.0 %   MCV 87.8 78.0 - 100.0 fL   MCH 30.1 26.0 - 34.0 pg   MCHC 34.3 30.0 - 36.0 g/dL   RDW 12.9 11.5 - 15.5 %   Platelets 173 150 - 400 K/uL   No results found.  ED Clinical Impression  Non-intractable vomiting without nausea, vomiting of unspecified type  Cough  Seasonal allergies   ED Assessment/Plan  Labs were done in the ED. CMP,  CBC, lipase normal. . Unsure as to the etiology of his vomiting, but he is tolerating by mouth appears nontoxic. no evidence of surgical abdomen, obstruction, pancreatitis, gallbladder disease, peptic ulcer disease, perforated ulcer, dehydration. We'll treat this with Zofran and have him follow up with his primary care physician in several days. Feel that the cough is from seasonal allergies, postnasal drip. May also be a mild asthma exacerbation. He is satting well, in no respiratory distress. Doubt pneumonia. Do not see the utility of prescribing antibiotics at this time. We will send home with nasal steroids, advised to start an antihistamine of his choice such as Claritin, Zyrtec, Allegra. We'll also sent home with albuterol with spacer and tessalon. Pt reports N/V with codeine.  Discussed  MDM, plan and followup with patient. Discussed sn/sx that should prompt return to the ED. Patient agrees with plan.   *This clinic note was created using Dragon dictation software. Therefore, there may be occasional mistakes despite careful proofreading.  ?  Melynda Ripple, MD 09/26/15 1139

## 2015-09-25 NOTE — ED Notes (Signed)
Patient complains of cough and complains of vomiting independently of the cough.  Reports chills and hot spells.  Frequent coughing-dry cough.  Symptoms started yesterday

## 2015-09-25 NOTE — ED Notes (Signed)
Pt reports abdominal pain, n/v since yesterday.

## 2015-09-25 NOTE — Discharge Instructions (Signed)
Start some saline nasal irrigation, start the Claritin, albuterol 2 puffs every 4-6 hours as needed for coughing Make sure you use the AeroChamber. Also start the Flonase.Tessalon Perles are good for cough. Take Zofran as needed for vomiting.

## 2015-09-25 NOTE — ED Notes (Signed)
Pt walked up to urgent care and they contacted [place] to let us know to d/c him ot of our system so he could be evaluated by them.

## 2015-10-06 ENCOUNTER — Telehealth: Payer: Self-pay | Admitting: Internal Medicine

## 2015-10-06 NOTE — Telephone Encounter (Signed)
Pt. Came into facility to drop off parking placard to be filled out. Paperwork will be put in providers box.

## 2015-10-21 ENCOUNTER — Emergency Department (HOSPITAL_COMMUNITY)
Admission: EM | Admit: 2015-10-21 | Discharge: 2015-10-21 | Disposition: A | Discharge status: Home / Self Care | Payer: Medicaid Other | Attending: Emergency Medicine | Admitting: Emergency Medicine

## 2015-10-21 ENCOUNTER — Encounter (HOSPITAL_COMMUNITY): Payer: Self-pay

## 2015-10-21 ENCOUNTER — Emergency Department (HOSPITAL_COMMUNITY): Payer: Medicaid Other

## 2015-10-21 DIAGNOSIS — Z880 Allergy status to penicillin: Secondary | ICD-10-CM | POA: Diagnosis not present

## 2015-10-21 DIAGNOSIS — F319 Bipolar disorder, unspecified: Secondary | ICD-10-CM | POA: Diagnosis not present

## 2015-10-21 DIAGNOSIS — Z7951 Long term (current) use of inhaled steroids: Secondary | ICD-10-CM | POA: Insufficient documentation

## 2015-10-21 DIAGNOSIS — M79675 Pain in left toe(s): Secondary | ICD-10-CM | POA: Diagnosis present

## 2015-10-21 DIAGNOSIS — F209 Schizophrenia, unspecified: Secondary | ICD-10-CM | POA: Diagnosis not present

## 2015-10-21 DIAGNOSIS — Z79899 Other long term (current) drug therapy: Secondary | ICD-10-CM | POA: Insufficient documentation

## 2015-10-21 DIAGNOSIS — M2042 Other hammer toe(s) (acquired), left foot: Secondary | ICD-10-CM | POA: Diagnosis not present

## 2015-10-21 MED ORDER — NAPROXEN 500 MG PO TABS: 500.0000 mg | ORAL_TABLET | Freq: Two times a day (BID) | ORAL | Status: DC

## 2015-10-21 MED ORDER — IBUPROFEN 400 MG PO TABS
600.0000 mg | ORAL_TABLET | Freq: Once | ORAL | Status: AC
  Administered 2015-10-21: 600 mg via ORAL
  Filled 2015-10-21: qty 1

## 2015-10-21 NOTE — ED Notes (Signed)
Patient here with left foot 4th toe pain x 2 days. Denies trauma. No redness, no deformity noted

## 2015-10-21 NOTE — Discharge Instructions (Signed)
You have been seen today for toe pain. It looks as if you may have was called a hammer toe. Follow-up with orthopedic surgery on this issue. Call the number provided to set up an appointment. The rigid shoe for comfort. Follow up with PCP as needed. Return to ED should symptoms worsen.

## 2015-10-21 NOTE — ED Provider Notes (Signed)
CSN: [id number]     Arrival date & time 10/21/15  1217 History  By signing my name below, I, Franklin [name], attest that this documentation has been prepared under the direction and in the presence of Franklin Parrow, PA-C. Electronically Signed: Octavia [name], ED Scribe. 10/21/2015. 3:55 PM.    Chief Complaint  Patient presents with  . Toe Pain     (Consider location/radiation/quality/duration/timing/severity/associated sxs/prior Treatment) The history is provided by the patient. No language interpreter was used.   HPI Comments: Franklin [name] is a 35 y.o. male who presents to the Emergency Department complaining of sudden, 10/10, gradually worsening toe pain to his fourth toe that began 2 days ago. Pt notes that his pain is worsened by walking on it, wiggling his toes, and wearing shoes. Pt states he tried cocoa butter and vaseline to alleviate pain with no relief. Pt denies trauma/falls, neuro deficits, or any other complaints.     Past Medical History  Diagnosis Date  . Depression   . Seizures ([facility])   . Bipolar 1 disorder ([facility])   . Schizophrenic disorder ([facility])    History reviewed. No pertinent past surgical history. Family History  Problem Relation Age of Onset  . Heart disease Mother    Social History  Substance Use Topics  . Smoking status: Never Smoker   . Smokeless tobacco: Never Used  . Alcohol Use: No    Review of Systems  Musculoskeletal: Positive for arthralgias. Negative for joint swelling.  Neurological: Negative for numbness.      Allergies  Coconut flavor; Codeine; Penicillins; Depakote; and Tape  Home Medications   Prior to Admission medications   Medication Sig Start Date End Date Taking? Authorizing Provider  acetaminophen (TYLENOL) 500 MG tablet Take 1,000 mg by mouth daily as needed for mild pain or headache.    Historical Provider, MD  albuterol (PROVENTIL HFA;VENTOLIN HFA) 108 (90 Base) MCG/ACT inhaler Inhale 1-2 puffs into the lungs every 6 (six)  hours as needed for wheezing or shortness of breath. 09/25/15   Domenick Gong, MD  benzonatate (TESSALON) 100 MG capsule Take 1 capsule (100 mg total) by mouth every 8 (eight) hours. 09/25/15   Domenick Gong, MD  fluticasone (FLONASE) 50 MCG/ACT nasal spray Place 2 sprays into both nostrils daily. 09/25/15   Domenick Gong, MD  levETIRAcetam (KEPPRA) 500 MG tablet Take 1 tablet (500 mg total) by mouth 2 (two) times daily. 08/23/15   Lula Olszewski, MD  levETIRAcetam (KEPPRA) 500 MG tablet Take 1 tablet (500 mg total) by mouth 2 (two) times daily. 09/12/15   April Palumbo, MD  loratadine (CLARITIN) 10 MG tablet Take 1 tablet (10 mg total) by mouth daily. 09/25/15   Domenick Gong, MD  naproxen (NAPROSYN) 500 MG tablet Take 1 tablet (500 mg total) by mouth 2 (two) times daily. 10/21/15   Micha Dosanjh C Lendell Gallick, PA-C  ondansetron (ZOFRAN) 4 MG tablet Take 1 tablet (4 mg total) by mouth every 6 (six) hours. 09/25/15   Domenick Gong, MD  QUEtiapine (SEROQUEL XR) 50 MG TB24 24 hr tablet Take 50 mg by mouth at bedtime.    Historical Provider, MD  Spacer/Aero-Holding Chambers (AEROCHAMBER PLUS) inhaler Use as instructed 09/25/15   Domenick Gong, MD   Triage Vitals: BP 119/82 mmHg  Pulse 76  Temp(Src) 98.3 F (36.8 C) (Oral)  Ht 6' (1.829 m)  Wt 155 lb (70.308 kg)  BMI 21.02 kg/m2  SpO2 98% Physical Exam  Constitutional: He appears well-developed and well-nourished. No distress.  HENT:  Head: Normocephalic and atraumatic.  Eyes: Conjunctivae are normal.  Neck: Neck supple.  Cardiovascular: Normal rate and regular rhythm.   Pulmonary/Chest: Effort normal.  Musculoskeletal:  Chronic appearing deformity with plantar angulation of the left fourth toe  TTP, no discernable swelling or erythema    Neurological: He is alert.  Strength 5/5  No sensory deficits   Skin: Skin is warm and dry. He is not diaphoretic.  Nursing note and vitals reviewed.   ED Course  Procedures  DIAGNOSTIC STUDIES: Oxygen  Saturation is 98% on RA, normal by my interpretation.  COORDINATION OF CARE: 2:02 PM Will order X-ray of left foot. Discussed treatment plan with pt at bedside and pt agreed to plan.   Labs Review Labs Reviewed - No data to display  Imaging Review Dg Foot Complete Left  10/21/2015  CLINICAL DATA:  Left fourth toe pain for 2 days. EXAM: LEFT FOOT - COMPLETE 3+ VIEW COMPARISON:  None. FINDINGS: Head the left fourth toe appears flexed underneath the left third toe. Minimal spurring at the first MTP joint. No malalignment at the Lisfranc joint. No fracture or subluxation is identified. IMPRESSION: 1. No acute findings. The left fourth toe is flexed and appears to extend below the left third toe. Electronically Signed   By: Van Clines M.D.   On: 10/21/2015 15:13   I have personally reviewed and evaluated these images as part of my medical decision-making.   EKG Interpretation None      MDM   Final diagnoses:  Hammer toe of left foot    Franklin [name] presents with left toe pain worsening 2 days ago.  Suspect hammertoe deformity. Postop shoe placed. Patient to follow-up with orthopedics. Home care and return precautions discussed. Patient voiced understanding of these instructions and is comfortable with discharge.  I personally performed the services described in this documentation, which was scribed in my presence. The recorded information has been reviewed and is accurate.  Filed Vitals:   10/21/15 1225  BP: 119/82  Pulse: 76  Temp: 98.3 F (36.8 C)  TempSrc: Oral  Height: 6' (1.829 m)  Weight: 70.308 kg  SpO2: 98%     Lorayne Bender, PA-C 10/21/15 [place], MD 10/21/15 1715

## 2015-10-21 NOTE — Telephone Encounter (Signed)
Pt. Came into facility to check on the status of a parking placard form that he dropped off on 10/06/15. Please f/u with pt.

## 2015-11-30 ENCOUNTER — Observation Stay (HOSPITAL_COMMUNITY)
Admission: EM | Admit: 2015-11-30 | Discharge: 2015-12-01 | Disposition: A | Discharge status: Home / Self Care | Payer: Medicaid Other | Attending: Internal Medicine | Admitting: Internal Medicine

## 2015-11-30 ENCOUNTER — Observation Stay (HOSPITAL_COMMUNITY): Payer: Medicaid Other

## 2015-11-30 ENCOUNTER — Encounter (HOSPITAL_COMMUNITY): Payer: Self-pay | Admitting: *Deleted

## 2015-11-30 DIAGNOSIS — R7989 Other specified abnormal findings of blood chemistry: Secondary | ICD-10-CM

## 2015-11-30 DIAGNOSIS — F209 Schizophrenia, unspecified: Secondary | ICD-10-CM | POA: Diagnosis not present

## 2015-11-30 DIAGNOSIS — R778 Other specified abnormalities of plasma proteins: Secondary | ICD-10-CM | POA: Diagnosis present

## 2015-11-30 DIAGNOSIS — R55 Syncope and collapse: Principal | ICD-10-CM | POA: Insufficient documentation

## 2015-11-30 DIAGNOSIS — F319 Bipolar disorder, unspecified: Secondary | ICD-10-CM | POA: Diagnosis not present

## 2015-11-30 DIAGNOSIS — G40909 Epilepsy, unspecified, not intractable, without status epilepticus: Secondary | ICD-10-CM | POA: Diagnosis not present

## 2015-11-30 DIAGNOSIS — R9431 Abnormal electrocardiogram [ECG] [EKG]: Secondary | ICD-10-CM | POA: Diagnosis not present

## 2015-11-30 LAB — BASIC METABOLIC PANEL
Anion gap: 6 (ref 5–15)
BUN: 12 mg/dL (ref 6–20)
CALCIUM: 9.2 mg/dL (ref 8.9–10.3)
CO2: 25 mmol/L (ref 22–32)
CREATININE: 0.87 mg/dL (ref 0.61–1.24)
Chloride: 108 mmol/L (ref 101–111)
GFR calc non Af Amer: >60 mL/min (ref >60)
GLUCOSE: 99 mg/dL (ref 65–99)
Potassium: 3.5 mmol/L (ref 3.5–5.1)
Sodium: 139 mmol/L (ref 135–145)

## 2015-11-30 LAB — CBC WITH DIFFERENTIAL/PLATELET
BASOS PCT: 0 %
Basophils Absolute: 0 10*3/uL (ref 0.0–0.1)
Eosinophils Absolute: 0 10*3/uL (ref 0.0–0.7)
Eosinophils Relative: 0 %
HEMATOCRIT: 44.5 % (ref 39.0–52.0)
Hemoglobin: 15.6 g/dL (ref 13.0–17.0)
Lymphocytes Relative: 23 %
Lymphs Abs: 1.8 10*3/uL (ref 0.7–4.0)
MCH: 30.6 pg (ref 26.0–34.0)
MCHC: 35.1 g/dL (ref 30.0–36.0)
MCV: 87.4 fL (ref 78.0–100.0)
MONO ABS: 0.6 10*3/uL (ref 0.1–1.0)
MONOS PCT: 7 %
NEUTROS ABS: 5.4 10*3/uL (ref 1.7–7.7)
Neutrophils Relative %: 70 %
Platelets: 177 10*3/uL (ref 150–400)
RBC: 5.09 MIL/uL (ref 4.22–5.81)
RDW: 12.1 % (ref 11.5–15.5)
WBC: 7.8 10*3/uL (ref 4.0–10.5)

## 2015-11-30 LAB — RAPID URINE DRUG SCREEN, HOSP PERFORMED
Amphetamines: NOT DETECTED
BARBITURATES: NOT DETECTED
Benzodiazepines: NOT DETECTED
COCAINE: NOT DETECTED
Opiates: NOT DETECTED
Tetrahydrocannabinol: NOT DETECTED

## 2015-11-30 LAB — CBG MONITORING, ED: GLUCOSE-CAPILLARY: 95 mg/dL (ref 65–99)

## 2015-11-30 LAB — TROPONIN I: TROPONIN I: 0.03 ng/mL — AB (ref <0.03)

## 2015-11-30 MED ORDER — ACETAMINOPHEN 325 MG PO TABS: 650.0000 mg | ORAL_TABLET | Freq: Four times a day (QID) | ORAL | Status: DC | PRN

## 2015-11-30 MED ORDER — ACETAMINOPHEN 650 MG RE SUPP: 650.0000 mg | Freq: Four times a day (QID) | RECTAL | Status: DC | PRN

## 2015-11-30 MED ORDER — QUETIAPINE FUMARATE ER 50 MG PO TB24
50.0000 mg | ORAL_TABLET | Freq: Every day | ORAL | Status: DC
  Administered 2015-12-01: 50 mg via ORAL
  Filled 2015-11-30 (×3): qty 1

## 2015-11-30 MED ORDER — LEVETIRACETAM IN NACL 1000 MG/100ML IV SOLN
1000.0000 mg | Freq: Once | INTRAVENOUS | Status: AC
  Administered 2015-11-30: 1000 mg via INTRAVENOUS
  Filled 2015-11-30: qty 100

## 2015-11-30 MED ORDER — LORATADINE 10 MG PO TABS
10.0000 mg | ORAL_TABLET | Freq: Every day | ORAL | Status: DC
  Administered 2015-12-01: 10 mg via ORAL
  Filled 2015-11-30: qty 1

## 2015-11-30 MED ORDER — LEVETIRACETAM 500 MG PO TABS
500.0000 mg | ORAL_TABLET | Freq: Two times a day (BID) | ORAL | Status: DC
  Administered 2015-12-01 (×2): 500 mg via ORAL
  Filled 2015-11-30 (×2): qty 1

## 2015-11-30 MED ORDER — ALBUTEROL SULFATE (2.5 MG/3ML) 0.083% IN NEBU: 3.0000 mL | INHALATION_SOLUTION | Freq: Four times a day (QID) | RESPIRATORY_TRACT | Status: DC | PRN

## 2015-11-30 MED ORDER — SODIUM CHLORIDE 0.9 % IV BOLUS (SEPSIS)
1000.0000 mL | Freq: Once | INTRAVENOUS | Status: AC
  Administered 2015-11-30: 1000 mL via INTRAVENOUS

## 2015-11-30 MED ORDER — FLUTICASONE PROPIONATE 50 MCG/ACT NA SUSP
2.0000 | Freq: Every day | NASAL | Status: DC
  Administered 2015-12-01: 2 via NASAL
  Filled 2015-11-30: qty 16

## 2015-11-30 MED ORDER — SODIUM CHLORIDE 0.9% FLUSH
3.0000 mL | Freq: Two times a day (BID) | INTRAVENOUS | Status: DC
  Administered 2015-12-01 (×2): 3 mL via INTRAVENOUS

## 2015-11-30 MED ORDER — ONDANSETRON HCL 4 MG PO TABS
4.0000 mg | ORAL_TABLET | Freq: Four times a day (QID) | ORAL | Status: DC
  Administered 2015-12-01 (×4): 4 mg via ORAL
  Filled 2015-11-30 (×4): qty 1

## 2015-11-30 NOTE — ED Provider Notes (Signed)
CSN: [id number]     Arrival date & time 11/30/15  1945 History   First MD Initiated Contact with Patient 11/30/15 1952     Chief Complaint  Patient presents with  . Seizures     (Consider location/radiation/quality/duration/timing/severity/associated sxs/prior Treatment) HPI Comments: Patient states "I'm here because I know I had a seizure". When asked how he knows this patient states "I just know it". States he was with his grandmother who reported he was having seizure activity lasted a few minutes. Patient states he feels dizzy which she always feels after seizure. Denies hitting his head or losing consciousness. Has a seizure disorder and states he has not had his Keppra in one week because he lost it. Denies any illicit drug or alcohol use. No tongue biting or incontinence. No postictal period. He is awake and alert. Denies any head, neck, back, chest or abdominal pain. No fever. No focal weakness, numbness or tingling.  The history is provided by the EMS personnel and the patient. The history is limited by the condition of the patient.    Past Medical History  Diagnosis Date  . Depression   . Seizures ([place])   . Bipolar 1 disorder ([place])   . Schizophrenic disorder ([place])    History reviewed. No pertinent past surgical history. Family History  Problem Relation Age of Onset  . Heart disease Mother    Social History  Substance Use Topics  . Smoking status: Never Smoker   . Smokeless tobacco: Never Used  . Alcohol Use: No    Review of Systems  Constitutional: Negative for fever, activity change and appetite change.  HENT: Negative for congestion.   Eyes: Negative for visual disturbance.  Respiratory: Negative for cough, chest tightness and shortness of breath.   Cardiovascular: Negative for chest pain.  Gastrointestinal: Negative for nausea, vomiting and abdominal pain.  Endocrine: Negative for polyuria.  Genitourinary: Negative for dysuria, hematuria and testicular pain.   Musculoskeletal: Negative for myalgias and arthralgias.  Skin: Negative for wound.  Neurological: Positive for seizures. Negative for dizziness, weakness and headaches.  A complete 10 system review of systems was obtained and all systems are negative except as noted in the HPI and PMH.      Allergies  Coconut flavor; Codeine; Penicillins; Depakote; and Tape  Home Medications   Prior to Admission medications   Medication Sig Start Date End Date Taking? Authorizing Provider  acetaminophen (TYLENOL) 500 MG tablet Take 1,000 mg by mouth daily as needed for mild pain or headache.    Historical Provider, MD  albuterol (PROVENTIL HFA;VENTOLIN HFA) 108 (90 Base) MCG/ACT inhaler Inhale 1-2 puffs into the lungs every 6 (six) hours as needed for wheezing or shortness of breath. 09/25/15   Melynda Ripple, MD  benzonatate (TESSALON) 100 MG capsule Take 1 capsule (100 mg total) by mouth every 8 (eight) hours. 09/25/15   Melynda Ripple, MD  fluticasone (FLONASE) 50 MCG/ACT nasal spray Place 2 sprays into both nostrils daily. 09/25/15   Melynda Ripple, MD  levETIRAcetam (KEPPRA) 500 MG tablet Take 1 tablet (500 mg total) by mouth 2 (two) times daily. 08/23/15   Margaretann Loveless, MD  levETIRAcetam (KEPPRA) 500 MG tablet Take 1 tablet (500 mg total) by mouth 2 (two) times daily. 09/12/15   April Palumbo, MD  loratadine (CLARITIN) 10 MG tablet Take 1 tablet (10 mg total) by mouth daily. 09/25/15   Melynda Ripple, MD  naproxen (NAPROSYN) 500 MG tablet Take 1 tablet (500 mg total) by mouth 2 (  two) times daily. 10/21/15   Shawn C Joy, PA-C  ondansetron (ZOFRAN) 4 MG tablet Take 1 tablet (4 mg total) by mouth every 6 (six) hours. 09/25/15   Melynda Ripple, MD  QUEtiapine (SEROQUEL XR) 50 MG TB24 24 hr tablet Take 50 mg by mouth at bedtime.    Historical Provider, MD  Spacer/Aero-Holding Chambers (AEROCHAMBER PLUS) inhaler Use as instructed 09/25/15   Melynda Ripple, MD   BP 126/78 mmHg  Pulse 70  Temp(Src)  98.1 F (36.7 C) (Oral)  Resp 20  Ht 6' (1.829 m)  Wt 149 lb (67.586 kg)  BMI 20.20 kg/m2  SpO2 99% Physical Exam  Constitutional: He is oriented to person, place, and time. He appears well-developed and well-nourished. No distress.  HENT:  Head: Normocephalic and atraumatic.  Mouth/Throat: Oropharynx is clear and moist. No oropharyngeal exudate.  No trauma to tongue  Eyes: Conjunctivae and EOM are normal. Pupils are equal, round, and reactive to light.  Neck: Normal range of motion. Neck supple.  No meningismus. No C spine tenderness  Cardiovascular: Normal rate, regular rhythm, normal heart sounds and intact distal pulses.   No murmur heard. Pulmonary/Chest: Effort normal and breath sounds normal. No respiratory distress. He exhibits no tenderness.  Abdominal: Soft. There is no tenderness. There is no rebound and no guarding.  Genitourinary:  No incontinence  Musculoskeletal: Normal range of motion. He exhibits no edema or tenderness.  Neurological: He is alert and oriented to person, place, and time. No cranial nerve deficit. He exhibits normal muscle tone. Coordination normal.  No ataxia on finger to nose bilaterally. No pronator drift. 5/5 strength throughout. CN 2-12 intact.Equal grip strength. Sensation intact.  Positive romberg, unable to ambulate without assistance. No nystagmus  Skin: Skin is warm.  Psychiatric: He has a normal mood and affect. His behavior is normal.  Nursing note and vitals reviewed.   ED Course  Procedures (including critical care time) Labs Review Labs Reviewed  TROPONIN I - Abnormal; Notable for the following:    Troponin I 0.03 (*)    All other components within normal limits  CBC WITH DIFFERENTIAL/PLATELET  BASIC METABOLIC PANEL  URINE RAPID DRUG SCREEN, HOSP PERFORMED  CBG MONITORING, ED    Imaging Review No results found. I have personally reviewed and evaluated these images and lab results as part of my medical decision-making.    EKG Interpretation   Date/Time:  Sunday November 30 2015 21:07:32 EDT Ventricular Rate:  60 PR Interval:    QRS Duration: 116 QT Interval:  391 QTC Calculation: 391 R Axis:   -11 Text Interpretation:  Sinus rhythm Nonspecific intraventricular conduction  delay ST elevation suggests acute pericarditis No significant change was  found Confirmed by Wyvonnia Dusky  MD, Kenyah Luba [phone]) on 11/30/2015 9:23:08 PM      MDM   Final diagnoses:  Syncope, unspecified syncope type  Abnormal EKG   Patient with reported seizure, history of noncompliance. He is awake and alert. No focal neurological exam.  IVF, IV keppra loading dose given.   Patient remains dizzy and lightheaded with attempted ambulation and cannot walk without assistance. He states this is normal after his seizures. EKG shows an incomplete right bundle-branch block with nonspecific intraventricular conduction delay. This is concerning for possible Brugada syndrome.  Reviewed with cardiology Dr. Eula Fried who agrees and recommends observation. He does not feel patient needs to be transferred to [facility]. It is feasible that patient may be having syncopal episodes from possible Brugada syndrome rather than a seizure.  Observation admission d/w Dr. Nehemiah Settle. Will obtain CT head with ongoing difficulty walking.  Ezequiel Essex, MD 11/30/15 2248

## 2015-11-30 NOTE — ED Notes (Signed)
Attempted to call report, was told that nurse will call back

## 2015-11-30 NOTE — ED Notes (Signed)
Pt here for seizures, pt admits noncompliance with seizure medication. Denies any pain.  Pt alert and oriented to name, place and year

## 2015-11-30 NOTE — H&P (Signed)
History and Physical  Franklin [name] [id number] DOB: [birth year] DOA: 11/30/2015  Referring physician: Dr Wyvonnia Dusky, ED physician PCP: Lance Bosch, NP   Chief Complaint: I had a seizure  HPI: Franklin [name] is a 35 y.o. male with a history of bipolar 1, seizure disorder, schizophrenia, depression. Patient has been seen multiple times in the emergency department for seizures. The patient last took his seizure medication approximately week ago due to forgetting. Patient states that he had a seizure at home, although the patient has had no loss consciousness, head trauma, postictal state, tongue biting, incontinence. Denies any focal symptoms. No provoking or palliative factors. Describes his normal seizures as mixed type: Tonic/clonic, absence, staring. Denies family history of sudden death or abnormal cardiac.   Review of Systems:   Pt denies any fevers, chills, nausea, vomiting, diarrhea, constipation, abdominal pain, shortness of breath, dyspnea on exertion, orthopnea, cough, wheezing, palpitations, headache, vision changes, lightheadedness, dizziness, melena, rectal bleeding.  Review of systems are otherwise negative  Past Medical History  Diagnosis Date  . Depression   . Seizures ([place])   . Bipolar 1 disorder ([place])   . Schizophrenic disorder ([place])    History reviewed. No pertinent past surgical history. Social History:  reports that he has never smoked. He has never used smokeless tobacco. He reports that he does not drink alcohol or use illicit drugs. Patient lives at [facility]  . Coconut Flavor Nausea And Vomiting  . Codeine Nausea And Vomiting  . Penicillins Nausea And Vomiting    Has patient had a PCN reaction causing immediate rash, facial/tongue/throat swelling, SOB or lightheadedness with hypotension: No Has patient had a PCN reaction causing severe rash involving mucus membranes or skin necrosis: No Has patient had a PCN reaction that required  hospitalization No Has patient had a PCN reaction occurring within the last 10 years: No If all of the above answers are "NO", then may proceed with Cephalosporin use.    . Depakote [Divalproex Sodium] Other (See Comments)    Out of it, spaced out, didn't know where he was  . Tape Other (See Comments)    Tears skin off, Please use "paper" tape    Family History  Problem Relation Age of Onset  . Heart disease Mother      Prior to Admission medications   Medication Sig Start Date End Date Taking? Authorizing Provider  acetaminophen (TYLENOL) 500 MG tablet Take 1,000 mg by mouth daily as needed for mild pain or headache.    Historical Provider, MD  albuterol (PROVENTIL HFA;VENTOLIN HFA) 108 (90 Base) MCG/ACT inhaler Inhale 1-2 puffs into the lungs every 6 (six) hours as needed for wheezing or shortness of breath. 09/25/15   Melynda Ripple, MD  benzonatate (TESSALON) 100 MG capsule Take 1 capsule (100 mg total) by mouth every 8 (eight) hours. 09/25/15   Melynda Ripple, MD  fluticasone (FLONASE) 50 MCG/ACT nasal spray Place 2 sprays into both nostrils daily. 09/25/15   Melynda Ripple, MD  levETIRAcetam (KEPPRA) 500 MG tablet Take 1 tablet (500 mg total) by mouth 2 (two) times daily. 08/23/15   Margaretann Loveless, MD  levETIRAcetam (KEPPRA) 500 MG tablet Take 1 tablet (500 mg total) by mouth 2 (two) times daily. 09/12/15   April Palumbo, MD  loratadine (CLARITIN) 10 MG tablet Take 1 tablet (10 mg total) by mouth daily. 09/25/15   Melynda Ripple, MD  naproxen (NAPROSYN) 500 MG tablet Take 1 tablet (500 mg total) by mouth 2 (  two) times daily. 10/21/15   Shawn C Joy, PA-C  ondansetron (ZOFRAN) 4 MG tablet Take 1 tablet (4 mg total) by mouth every 6 (six) hours. 09/25/15   Melynda Ripple, MD  QUEtiapine (SEROQUEL XR) 50 MG TB24 24 hr tablet Take 50 mg by mouth at bedtime.    Historical Provider, MD  Spacer/Aero-Holding Chambers (AEROCHAMBER PLUS) inhaler Use as instructed 09/25/15   Melynda Ripple, MD      Physical Exam: BP 119/77 mmHg  Pulse 51  Temp(Src) 98.1 F (36.7 C) (Oral)  Resp 21  Ht 6' (1.829 m)  Wt 67.586 kg (149 lb)  BMI 20.20 kg/m2  SpO2 98%  General: Young male. Awake and alert and oriented x3. No acute cardiopulmonary distress.  HEENT: Normocephalic atraumatic.  Right and left ears normal in appearance.  Pupils equal, round, reactive to light. Extraocular muscles are intact. Sclerae anicteric and noninjected.  Moist mucosal membranes. No mucosal lesions.  Neck: Neck supple without lymphadenopathy. No carotid bruits. No masses palpated.  Cardiovascular: Regular rate with normal S1-S2 sounds. No murmurs, rubs, gallops auscultated. No JVD.  Respiratory: Good respiratory effort with no wheezes, rales, rhonchi. Lungs clear to auscultation bilaterally.  No accessory muscle use. Abdomen: Soft, nontender, nondistended. Active bowel sounds. No masses or hepatosplenomegaly  Skin: No rashes, lesions, or ulcerations.  Dry, warm to touch. 2+ dorsalis pedis and radial pulses. Musculoskeletal: No calf or leg pain. All major joints not erythematous nontender.  No upper or lower joint deformation.  Good ROM.  No contractures  Psychiatric: Intact judgment and insight. Pleasant and cooperative. Neurologic: No focal neurological deficits. Strength is 5/5 and symmetric in upper and lower extremities.  Cranial nerves II through XII are grossly intact.           Labs on Admission: I have personally reviewed following labs and imaging studies  CBC:  Recent Labs Lab 11/30/15 2025  WBC 7.8  NEUTROABS 5.4  HGB 15.6  HCT 44.5  MCV 87.4  PLT 123XX123   Basic Metabolic Panel:  Recent Labs Lab 11/30/15 2025  NA 139  K 3.5  CL 108  CO2 25  GLUCOSE 99  BUN 12  CREATININE 0.87  CALCIUM 9.2   GFR: Estimated Creatinine Clearance: 115.5 mL/min (by C-G formula based on Cr of 0.87). Liver Function Tests: No results for input(s): AST, ALT, ALKPHOS, BILITOT, PROT, ALBUMIN in the last 168  hours. No results for input(s): LIPASE, AMYLASE in the last 168 hours. No results for input(s): AMMONIA in the last 168 hours. Coagulation Profile: No results for input(s): INR, PROTIME in the last 168 hours. Cardiac Enzymes:  Recent Labs Lab 11/30/15 2025  TROPONINI 0.03*   BNP (last 3 results) No results for input(s): PROBNP in the last 8760 hours. HbA1C: No results for input(s): HGBA1C in the last 72 hours. CBG:  Recent Labs Lab 11/30/15 1954  GLUCAP 95   Lipid Profile: No results for input(s): CHOL, HDL, LDLCALC, TRIG, CHOLHDL, LDLDIRECT in the last 72 hours. Thyroid Function Tests: No results for input(s): TSH, T4TOTAL, FREET4, T3FREE, THYROIDAB in the last 72 hours. Anemia Panel: No results for input(s): VITAMINB12, FOLATE, FERRITIN, TIBC, IRON, RETICCTPCT in the last 72 hours. Urine analysis:    Component Value Date/Time   COLORURINE YELLOW 05/21/2015 0029   APPEARANCEUR CLEAR 05/21/2015 0029   LABSPEC 1.018 05/21/2015 0029   PHURINE 7.0 05/21/2015 0029   GLUCOSEU NEGATIVE 05/21/2015 0029   HGBUR NEGATIVE 05/21/2015 0029   BILIRUBINUR NEGATIVE 05/21/2015 0029  KETONESUR NEGATIVE 05/21/2015 0029   PROTEINUR NEGATIVE 05/21/2015 0029   UROBILINOGEN 1.0 02/14/2015 2328   NITRITE NEGATIVE 05/21/2015 0029   LEUKOCYTESUR NEGATIVE 05/21/2015 0029   Sepsis Labs: @LABRCNTIP (procalcitonin:4,lacticidven:4) )No results found for this or any previous visit (from the past 240 hour(s)).   Radiological Exams on Admission: No results found.  EKG: Independently reviewed. Nonspecific intraventricular conduction delay. No ST elevation MI. There is some J-point elevation in the lateral leads, however this is not new.  Assessment/Plan: Active Problems:   Seizure disorder (HCC)   Abnormal EKG   Elevated troponin    This patient was discussed with the ED physician, including pertinent vitals, physical exam findings, labs, and imaging.  We also discussed care given by the  ED provider.  #1 abnormal EKG  ED provider discussed patient with cardiology due to concerns for Brugada's syndrome. Cardiology recommended observation on telemetry and formal cardiology consult in the morning.  Observation on telemetry #2 elevated troponin  Troponin barely elevated  Repeat in 3 hours #3 seizure disorder  Patient noted with Keppra  Continue Keppra  DVT prophylaxis: Early ambulation Consultants: Cardiology Code Status: Full code Family Communication: None  Disposition Plan: Home following observation   Truett Mainland, DO [facility] Pager [phone]  If 7PM-7AM, please contact night-coverage [email or web address] Password [email or web address]

## 2015-11-30 NOTE — ED Notes (Signed)
CRITICAL VALUE ALERT  Critical value received:  Trop 0.03 Date of notification:  11/30/15  Time of notification:  2158  Critical value read back:Yes.    Nurse who received alert:  B. Olena Heckle, RN  MD notified (1st page):  [place]  Time of first page:  2158

## 2015-11-30 NOTE — ED Notes (Signed)
Pt became dizzy while ambulating, I and a NT in dept assisted pt to wheelchair and assisted back to bed.  Pt admits to having dizziness earlier today.  EKG done and given to EDP for review.  EDP also aware of pt's dizziness.  EDP in room during orthostatics as well. Pt c/o room spinning after sitting up and worse with standing.  Pt states that this is normal after a seizure per pt.

## 2015-12-01 ENCOUNTER — Observation Stay (HOSPITAL_COMMUNITY): Payer: Medicaid Other

## 2015-12-01 DIAGNOSIS — R9431 Abnormal electrocardiogram [ECG] [EKG]: Secondary | ICD-10-CM | POA: Diagnosis not present

## 2015-12-01 DIAGNOSIS — R7989 Other specified abnormal findings of blood chemistry: Secondary | ICD-10-CM

## 2015-12-01 DIAGNOSIS — R001 Bradycardia, unspecified: Secondary | ICD-10-CM | POA: Diagnosis not present

## 2015-12-01 DIAGNOSIS — G40909 Epilepsy, unspecified, not intractable, without status epilepticus: Secondary | ICD-10-CM

## 2015-12-01 LAB — TROPONIN I: Troponin I: <0.03 ng/mL (ref <0.03)

## 2015-12-01 LAB — TSH: TSH: 1.18 u[IU]/mL (ref 0.350–4.500)

## 2015-12-01 MED ORDER — LEVETIRACETAM 500 MG PO TABS: 500.0000 mg | ORAL_TABLET | Freq: Two times a day (BID) | ORAL | Status: DC

## 2015-12-01 MED ORDER — POTASSIUM CHLORIDE CRYS ER 20 MEQ PO TBCR
40.0000 meq | EXTENDED_RELEASE_TABLET | Freq: Once | ORAL | Status: AC
  Administered 2015-12-01: 40 meq via ORAL
  Filled 2015-12-01: qty 2

## 2015-12-01 NOTE — Consult Note (Addendum)
CARDIOLOGY CONSULT NOTE   Patient ID: Melvis Belleau MRN: [id number] DOB/AGE: [birth year] 35 y.o.  Admit Date: 11/30/2015 Referring Physician: Lulu Riding MD Primary Physician: Lance Bosch, NP Consulting Cardiologist: Carlyle Dolly MD Primary Cardiologist  New Reason for Consultation: Abnormal EKG  Clinical Summary Mr. Rindlisbacher is a 35 y.o.male who presented to the ER with known history of Bipolar, Seizure disorder, schizophrenia, depression, who presented to the with complaints of syncope. He has had multiple ER visits for seizures, and was reported to have had a seizure before coming to the ER, but no loss of consciousness.   He states he lives in a [place] in [place] and has been moving around [place]. Has been out of his medications for over two weeks. He was visiting his grandmother who lives in [place] when he had the last seizure prompting ER evaluation. He admits to chronic dizziness, but no chest pain or DOE.   On arrival to ER, BP 126/78, HR 70, O2 Sat 99%. Afebrile. Labs were essentially normal with the exception of  troponin 0.03, with follow up troponin <0.30. CT of the head was negative for acute abnormalities. EKG demonstrated elevated T waves indicative of pericarditis. He was treated with Keppra  IV and IV fluids. UDS was negative for illicit drugs.    Allergies  Allergen Reactions  . Coconut Flavor Nausea And Vomiting  . Codeine Nausea And Vomiting  . Penicillins Nausea And Vomiting    Has patient had a PCN reaction causing immediate rash, facial/tongue/throat swelling, SOB or lightheadedness with hypotension: No Has patient had a PCN reaction causing severe rash involving mucus membranes or skin necrosis: No Has patient had a PCN reaction that required hospitalization No Has patient had a PCN reaction occurring within the last 10 years: No If all of the above answers are "NO", then may proceed with Cephalosporin use.    . Depakote [Divalproex Sodium]  Other (See Comments)    Pt states that this medication makes him feel like he is out of it.   . Tape Other (See Comments)    Reaction:  Tears skin  Pt is able to use paper tape.      Medications Scheduled Medications: . fluticasone  2 spray Each Nare Daily  . levETIRAcetam  500 mg Oral BID  . loratadine  10 mg Oral Daily  . ondansetron  4 mg Oral Q6H  . potassium chloride  40 mEq Oral Once  . QUEtiapine  50 mg Oral QHS  . sodium chloride flush  3 mL Intravenous Q12H      PRN Medications: acetaminophen **OR** acetaminophen, albuterol   Past Medical History  Diagnosis Date  . Depression   . Seizures ([place])   . Bipolar 1 disorder ([place])   . Schizophrenic disorder ([place])     History reviewed. No pertinent past surgical history.  Family History  Problem Relation Age of Onset  . Heart disease Mother     Social History Mr. Porras reports that he has never smoked. He has never used smokeless tobacco. Mr. Mischler reports that he does not drink alcohol.  Review of Systems Complete review of systems are found to be negative unless outlined in H&P above.  Physical Examination Blood pressure 119/77, pulse 51, temperature 98.1 F (36.7 C), temperature source Oral, resp. rate 21, height 6' (1.829 m), weight 149 lb (67.586 kg), SpO2 98 %.  Intake/Output Summary (Last 24 hours) at 12/01/15 1106 Last data filed at 12/01/15 0936  Gross per 24  hour  Intake   1003 ml  Output      0 ml  Net   1003 ml    Telemetry: NSR with sinus bradycardia   GEN: No acute distress HEENT: Conjunctiva and lids normal, oropharynx clear with moist mucosa. Neck: Supple, no elevated JVP or carotid bruits, no thyromegaly. Lungs: Clear to auscultation, nonlabored breathing at rest. Cardiac: Regular rate and rhythm, no S3 or significant systolic murmur, no pericardial rub. Abdomen: Soft, nontender, no hepatomegaly, bowel sounds present, no guarding or rebound. Extremities: No pitting edema, distal  pulses 2+. Skin: Warm and dry. Musculoskeletal: No kyphosis. Neuropsychiatric: Alert and oriented x3, affect grossly appropriate.  Prior Cardiac Testing/Procedures None Lab Results  Basic Metabolic Panel:  Recent Labs Lab 11/30/15 2025  NA 139  K 3.5  CL 108  CO2 25  GLUCOSE 99  BUN 12  CREATININE 0.87  CALCIUM 9.2    CBC:  Recent Labs Lab 11/30/15 2025  WBC 7.8  NEUTROABS 5.4  HGB 15.6  HCT 44.5  MCV 87.4  PLT 177    Cardiac Enzymes:  Recent Labs Lab 11/30/15 2025 12/01/15 0030 12/01/15 0623  TROPONINI 0.03* <0.03 <0.03   Radiology: Ct Head Wo Contrast  11/30/2015  CLINICAL DATA:  Seizure versus syncope.  Dizziness and seizure. EXAM: CT HEAD WITHOUT CONTRAST TECHNIQUE: Contiguous axial images were obtained from the base of the skull through the vertex without intravenous contrast. COMPARISON:  Head CT 06/26/2015 FINDINGS: Brain: No evidence of acute infarction, hemorrhage, extra-axial collection, ventriculomegaly, or mass effect. Vascular: No hyperdense vessel or unexpected calcification. Skull: Negative for fracture or focal lesion. Sinuses/Orbits: No acute findings. Other: None. IMPRESSION: No acute intracranial abnormality. Electronically Signed   By: Jeb Levering M.D.   On: 11/30/2015 22:43     ECG: NSR with T-wave enlargement. Rate of 60 bpm.   Impression and Recommendations  1. Abnormal EKG: Abnormal T-wave with non-specific ST abnormalities. Troponin has been negative X 3. He denies chest pain or dyspnea.  Potassium 3.5 on admission. Will replace. No evidence of pericarditis on exam or by history. Bradycardia on telemetry without pauses. Not on rate control medications. Check TSH  2. Seizure disorder: Frequent seizures. Self reported twice a week. He has been out of his medication for over two weeks. Recommend neurology consult if clinically warranted.   3. BiPolar Disorder with Depression: Does not see psychiatrist.     Signed: Phill Myron.  Lawrence NP [place]  12/01/2015, 11:06 AM Co-Sign MD  Attending Note  Patient seen and discussed with NP Purcell Nails, I agree with her documentation. 35 yo male history of bipolar disorder and seizure disorder with poor medication compliance admitted with seizure. He reports he was at his grandmothers house sitting when he had an episode of generalized shaking/muscle contractions followed by feeling weak and altered after. He reports this is typical of his previous seizures. He denies any lost of consciousness. Reports he has been off his seizure meds at home.   K 3.5, Cr 0.87, Hgb 15.6, Plt 177 Trop 0.03-->neg-->neg CT head: no acute process EKG SR, J-point elevation chronic  Patient admitted with seizure activity consistent with prior episodes, he has been off his seizure meds. No history supportive of cardiac syncope, do not see strong indication for further cardiac workup. He has chronic J-point elevation on his EKG that is benign. Some sinus brady on telemetry of unclear significance, his episode is not consistent with symptomatic bradycardia. Will have nursing staff ambulate patient and document heart  rates to evaluate chronotropic competence. Do not see indication for echo at this time, will d/c. If heart rates ok with ambulation ok for discharge from cardiac standpoint, he may f/u with cardiology as outpatient in 3-4 weeks.    Zandra Abts MD

## 2015-12-01 NOTE — Discharge Summary (Signed)
Physician Discharge Summary  Franklin [name] [id number] DOB: 09-11-1980 DOA: 11/30/2015  PCP: Lance Bosch, NP  Admit date: 11/30/2015 Discharge date: 12/01/2015  Admitted From: Home  Disposition:  Home   Recommendations for Outpatient Follow-up:  1. Follow up with PCP in 1-2 weeks 2. Please obtain BMP/CBC in one week 3. Follow up with cardiology in 3-4 weeks  Home Health: No  Equipment/Devices: None  Discharge Condition: Stable CODE STATUS: Full Diet recommendation: Heart healthy   Brief/Interim Summary: 15 yom with history of bipolar 1, seizure disorder, schizophrenia, and depression presented with complaints of recent seizure at home. Patient reported last taking his seizure medication one week ago due to forgetting. While in the ED patient noted to have abnormal EKG which was EDP discussed with cardiology. Recommendations were for admission for inpatient cardiology evaluation.   Discharge Diagnoses:  Principal Problem:   Abnormal EKG Active Problems:   Seizure disorder (HCC)   Elevated troponin  Patient  Was admitted for abnormal EKG. In the ED patient noted to have abnormal T-wave with non-specific ST abnormalities while in the ED. Concerns were for Brugada's syndrome. Cardiology has evaluated and reviewed EKG. Patient did not have any chronotropic incompetence with exertion. Blood pressures have remained stable. No further cardiology work up was recommended at this time. He can follow up with cardiology in 3-4 weeks. 1. Elevated troponin. Likely demand ischemia. Serial troponin flat. 2. Seizure disorder. Patient reports noncompliance with Keppra due to forgetting. He received a loading dose of keppra in the ED. No further seizures since admission to hospital. Continue outpatient Keppra dose.  3. Bipolar 1. Continue on seroquel 4. Dizziness. Patient reports chronic dizziness. When walking with staff in the hospital, it ws noted that he could walk independently without  difficulty   Discharge Instructions     Medication List    ASK your doctor about these medications        acetaminophen 500 MG tablet  Commonly known as:  TYLENOL  Take 1,000 mg by mouth every 6 (six) hours as needed for mild pain or headache.     albuterol 108 (90 Base) MCG/ACT inhaler  Commonly known as:  PROVENTIL HFA;VENTOLIN HFA  Inhale 1-2 puffs into the lungs every 6 (six) hours as needed for wheezing or shortness of breath.     levETIRAcetam 500 MG tablet  Commonly known as:  KEPPRA  Take 1 tablet (500 mg total) by mouth 2 (two) times daily.     loratadine 10 MG tablet  Commonly known as:  CLARITIN  Take 1 tablet (10 mg total) by mouth daily.     SEROQUEL XR 50 MG Tb24 24 hr tablet  Generic drug:  QUEtiapine  Take 50 mg by mouth at bedtime.        Allergies  Allergen Reactions  . Coconut Flavor Nausea And Vomiting  . Codeine Nausea And Vomiting  . Penicillins Nausea And Vomiting    Has patient had a PCN reaction causing immediate rash, facial/tongue/throat swelling, SOB or lightheadedness with hypotension: No Has patient had a PCN reaction causing severe rash involving mucus membranes or skin necrosis: No Has patient had a PCN reaction that required hospitalization No Has patient had a PCN reaction occurring within the last 10 years: No If all of the above answers are "NO", then may proceed with Cephalosporin use.    . Depakote [Divalproex Sodium] Other (See Comments)    Pt states that this medication makes him feel like he is out of it.   Marland Kitchen  Tape Other (See Comments)    Reaction:  Tears skin  Pt is able to use paper tape.      Consultations:  Cardiology   Procedures/Studies: Ct Head Wo Contrast  11/30/2015  CLINICAL DATA:  Seizure versus syncope.  Dizziness and seizure. EXAM: CT HEAD WITHOUT CONTRAST TECHNIQUE: Contiguous axial images were obtained from the base of the skull through the vertex without intravenous contrast. COMPARISON:  Head CT  06/26/2015 FINDINGS: Brain: No evidence of acute infarction, hemorrhage, extra-axial collection, ventriculomegaly, or mass effect. Vascular: No hyperdense vessel or unexpected calcification. Skull: Negative for fracture or focal lesion. Sinuses/Orbits: No acute findings. Other: None. IMPRESSION: No acute intracranial abnormality. Electronically Signed   By: Jeb Levering M.D.   On: 11/30/2015 22:43       Subjective: Complained of mild dizziness while ambulating which has been an issue for about a week. Denies any falls or LOC.    Discharge Exam: Filed Vitals:   12/01/15 1148 12/01/15 1248  BP: 128/71 135/75  Pulse: 53 50  Temp: 98 F (36.7 C)   Resp: 18 18   Filed Vitals:   11/30/15 2145 11/30/15 2200 12/01/15 1148 12/01/15 1248  BP: 117/88 119/77 128/71 135/75  Pulse: 53 51 53 50  Temp:   98 F (36.7 C)   TempSrc:   Oral   Resp:   18 18  Height:      Weight:      SpO2: 98% 98% 100% 97%    General: Pt is alert, awake, not in acute distress Cardiovascular: RRR, S1/S2 +, no rubs, no gallops Respiratory: CTA bilaterally, no wheezing, no rhonchi Abdominal: Soft, NT, ND, bowel sounds + Extremities: no edema, no cyanosis    The results of significant diagnostics from this hospitalization (including imaging, microbiology, ancillary and laboratory) are listed below for reference.     Microbiology: No results found for this or any previous visit (from the past 240 hour(s)).   Labs: BNP (last 3 results) No results for input(s): BNP in the last 8760 hours. Basic Metabolic Panel:  Recent Labs Lab 11/30/15 2025  NA 139  K 3.5  CL 108  CO2 25  GLUCOSE 99  BUN 12  CREATININE 0.87  CALCIUM 9.2   CBC:  Recent Labs Lab 11/30/15 2025  WBC 7.8  NEUTROABS 5.4  HGB 15.6  HCT 44.5  MCV 87.4  PLT 177   Cardiac Enzymes:  Recent Labs Lab 11/30/15 2025 12/01/15 0030 12/01/15 0623 12/01/15 1112  TROPONINI 0.03* <0.03 <0.03 <0.03   BNP: Invalid input(s):  POCBNP CBG:  Recent Labs Lab 11/30/15 1954  GLUCAP 95   Thyroid function studies  Recent Labs  12/01/15 1112  TSH 1.180   Urinalysis    Component Value Date/Time   COLORURINE YELLOW 05/21/2015 0029   APPEARANCEUR CLEAR 05/21/2015 0029   LABSPEC 1.018 05/21/2015 0029   PHURINE 7.0 05/21/2015 0029   GLUCOSEU NEGATIVE 05/21/2015 0029   HGBUR NEGATIVE 05/21/2015 0029   BILIRUBINUR NEGATIVE 05/21/2015 0029   KETONESUR NEGATIVE 05/21/2015 0029   PROTEINUR NEGATIVE 05/21/2015 0029   UROBILINOGEN 1.0 02/14/2015 2328   NITRITE NEGATIVE 05/21/2015 0029   LEUKOCYTESUR NEGATIVE 05/21/2015 0029   Sepsis Labs Invalid input(s): PROCALCITONIN,  WBC,  LACTICIDVEN Microbiology No results found for this or any previous visit (from the past 240 hour(s)).   Time coordinating discharge: Over 30 minutes  SIGNED:   Kathie Dike, MD [facility] 12/01/2015, 2:30 PM If 7PM-7AM, please contact night-coverage [email or web address] Password [email or web address]  By signing my name below, I, Rennis Harding, attest that this documentation has been prepared under the direction and in the presence of Kathie Dike, MD. Electronically signed: Rennis Harding, Scribe. 12/01/2015 2:42pm   I, Dr. Kathie Dike, personally performed the services described in this documentaiton. All medical record entries made by the scribe were at my direction and in my presence. I have reviewed the chart and agree that the record reflects my personal performance and is accurate and complete  Kathie Dike, MD, 12/01/2015 4:42 PM

## 2015-12-01 NOTE — Progress Notes (Addendum)
   12/01/15 1248  Vitals  BP 135/75 mmHg  BP Location Right Arm  BP Method Automatic  Patient Position (if appropriate) Lying  Pulse Rate (!) 50  Resp 18  Oxygen Therapy  SpO2 97 %  O2 Device Room Air   Pt's vital signs following ambulation as noted above. Pt complains of dizziness. Will continue to monitor patient.

## 2015-12-01 NOTE — Progress Notes (Signed)
Pt's IV catheter removed and intact. Pt's IV site clean dry and intact. Discharge instructions including medications and follow up appointments were reviewed and discussed with patient. Pt verbalized understanding of discharge instructions including follow up appointments and medications. All questions were answered and no further questions at this time. Pt in stable condition and in no acute distress at time of discharge. Pt will be escorted by nurse tech.

## 2015-12-01 NOTE — Progress Notes (Signed)
Pt ambulated in hallway. At rest patient's HR 63. During ambulation pt's heart rate was 69. During ambulation pt complained of dizziness but denied chest pain or discomfort. Pt was taken back to his room. Will continue to monitor patient.

## 2015-12-12 ENCOUNTER — Encounter (HOSPITAL_COMMUNITY): Payer: Self-pay | Admitting: *Deleted

## 2015-12-12 ENCOUNTER — Emergency Department (HOSPITAL_COMMUNITY)
Admission: EM | Admit: 2015-12-12 | Discharge: 2015-12-12 | Disposition: A | Discharge status: Home / Self Care | Payer: Medicaid Other | Attending: Emergency Medicine | Admitting: Emergency Medicine

## 2015-12-12 DIAGNOSIS — S61512A Laceration without foreign body of left wrist, initial encounter: Secondary | ICD-10-CM | POA: Insufficient documentation

## 2015-12-12 DIAGNOSIS — Z79899 Other long term (current) drug therapy: Secondary | ICD-10-CM | POA: Diagnosis not present

## 2015-12-12 DIAGNOSIS — F319 Bipolar disorder, unspecified: Secondary | ICD-10-CM | POA: Diagnosis not present

## 2015-12-12 DIAGNOSIS — Y999 Unspecified external cause status: Secondary | ICD-10-CM | POA: Insufficient documentation

## 2015-12-12 DIAGNOSIS — Y939 Activity, unspecified: Secondary | ICD-10-CM | POA: Insufficient documentation

## 2015-12-12 DIAGNOSIS — S53402A Unspecified sprain of left elbow, initial encounter: Secondary | ICD-10-CM | POA: Diagnosis not present

## 2015-12-12 DIAGNOSIS — Y929 Unspecified place or not applicable: Secondary | ICD-10-CM | POA: Diagnosis not present

## 2015-12-12 DIAGNOSIS — S60812A Abrasion of left wrist, initial encounter: Secondary | ICD-10-CM

## 2015-12-12 DIAGNOSIS — F209 Schizophrenia, unspecified: Secondary | ICD-10-CM | POA: Insufficient documentation

## 2015-12-12 DIAGNOSIS — S63502A Unspecified sprain of left wrist, initial encounter: Secondary | ICD-10-CM

## 2015-12-12 MED ORDER — IBUPROFEN 800 MG PO TABS: 800.0000 mg | ORAL_TABLET | Freq: Three times a day (TID) | ORAL | Status: DC

## 2015-12-12 MED ORDER — IBUPROFEN 800 MG PO TABS
800.0000 mg | ORAL_TABLET | Freq: Once | ORAL | Status: AC
  Administered 2015-12-12: 800 mg via ORAL
  Filled 2015-12-12: qty 1
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
## 2015-12-12 NOTE — Discharge Instructions (Signed)
Abrasion °An abrasion is a cut or scrape on the surface of your skin. An abrasion does not go through all of the layers of your skin. It is important to take good care of your abrasion to prevent infection. °HOME CARE °Medicines °· Take or apply medicines only as told by your doctor. °· If you were prescribed an antibiotic ointment, finish all of it even if you start to feel better. °Wound Care °· Clean the wound with mild soap and water 2-3 times per day or as told by your doctor. Pat your wound dry with a clean towel. Do not rub it. °· There are many ways to close and cover a wound. Follow instructions from your doctor about: °¨ How to take care of your wound. °¨ When and how you should change your bandage (dressing). °¨ When and how you should take off your dressing. °· Check your wound every day for signs of infection. Watch for: °¨ Redness, swelling, or pain. °¨ Fluid, blood, or pus. °General Instructions °· Keep the dressing dry as told by your doctor. Do not take baths, swim, use a hot tub, or do anything that would put your wound underwater until your doctor says it is okay. °· If there is swelling, raise (elevate) the injured area above the level of your heart while you are sitting or lying down. °· Keep all follow-up visits as told by your doctor. This is important. °GET HELP IF: °· You were given a tetanus shot and you have any of these where the needle went in: °¨ Swelling. °¨ Very bad pain. °¨ Redness. °¨ Bleeding. °· Medicine does not help your pain. °· You have any of these at the site of the wound: °¨ More redness. °¨ More swelling. °¨ More pain. °GET HELP RIGHT AWAY IF: °· You have a red streak going away from your wound. °· You have a fever. °· You have fluid, blood, or pus coming from your wound. °· There is a bad smell coming from your wound. °  °This information is not intended to replace advice given to you by your health care provider. Make sure you discuss any questions you have with your  health care provider. °  °Document Released: 11/03/2007 Document Revised: 10/01/2014 Document Reviewed: 05/15/2014 °Elsevier Interactive Patient Education ©2016 Elsevier Inc. ° °

## 2015-12-14 ENCOUNTER — Emergency Department (HOSPITAL_COMMUNITY)
Admission: EM | Admit: 2015-12-14 | Discharge: 2015-12-14 | Disposition: A | Discharge status: Home / Self Care | Payer: Medicaid Other | Attending: Emergency Medicine | Admitting: Emergency Medicine

## 2015-12-14 ENCOUNTER — Encounter (HOSPITAL_COMMUNITY): Payer: Self-pay | Admitting: Emergency Medicine

## 2015-12-14 ENCOUNTER — Encounter (HOSPITAL_COMMUNITY): Payer: Self-pay | Admitting: *Deleted

## 2015-12-14 ENCOUNTER — Emergency Department (EMERGENCY_DEPARTMENT_HOSPITAL)
Admission: EM | Admit: 2015-12-14 | Discharge: 2015-12-15 | Disposition: A | Discharge status: Home / Self Care | Payer: Medicaid Other | Source: Home / Self Care | Attending: Emergency Medicine | Admitting: Emergency Medicine

## 2015-12-14 ENCOUNTER — Emergency Department (HOSPITAL_COMMUNITY): Payer: Medicaid Other

## 2015-12-14 DIAGNOSIS — F329 Major depressive disorder, single episode, unspecified: Secondary | ICD-10-CM | POA: Diagnosis not present

## 2015-12-14 DIAGNOSIS — R251 Tremor, unspecified: Secondary | ICD-10-CM | POA: Diagnosis not present

## 2015-12-14 DIAGNOSIS — F319 Bipolar disorder, unspecified: Secondary | ICD-10-CM | POA: Insufficient documentation

## 2015-12-14 DIAGNOSIS — G40909 Epilepsy, unspecified, not intractable, without status epilepticus: Secondary | ICD-10-CM

## 2015-12-14 DIAGNOSIS — F209 Schizophrenia, unspecified: Secondary | ICD-10-CM | POA: Diagnosis not present

## 2015-12-14 DIAGNOSIS — R569 Unspecified convulsions: Secondary | ICD-10-CM | POA: Diagnosis present

## 2015-12-14 DIAGNOSIS — Z791 Long term (current) use of non-steroidal anti-inflammatories (NSAID): Secondary | ICD-10-CM

## 2015-12-14 DIAGNOSIS — Z760 Encounter for issue of repeat prescription: Secondary | ICD-10-CM

## 2015-12-14 DIAGNOSIS — Z79899 Other long term (current) drug therapy: Secondary | ICD-10-CM | POA: Insufficient documentation

## 2015-12-14 DIAGNOSIS — F3175 Bipolar disorder, in partial remission, most recent episode depressed: Secondary | ICD-10-CM | POA: Diagnosis not present

## 2015-12-14 HISTORY — DX: Headache: R51

## 2015-12-14 HISTORY — DX: Personal history of other medical treatment: Z92.89

## 2015-12-14 HISTORY — DX: Mild cognitive impairment of uncertain or unknown etiology: G31.84

## 2015-12-14 HISTORY — DX: Other chronic pain: G89.29

## 2015-12-14 HISTORY — DX: Headache, unspecified: R51.9

## 2015-12-14 HISTORY — DX: Unspecified convulsions: R56.9

## 2015-12-14 LAB — I-STAT CHEM 8, ED
BUN: 14 mg/dL (ref 6–20)
CALCIUM ION: 1.22 mmol/L (ref 1.13–1.30)
CHLORIDE: 104 mmol/L (ref 101–111)
Creatinine, Ser: 0.9 mg/dL (ref 0.61–1.24)
GLUCOSE: 99 mg/dL (ref 65–99)
HCT: 44 % (ref 39.0–52.0)
Hemoglobin: 15 g/dL (ref 13.0–17.0)
POTASSIUM: 3.5 mmol/L (ref 3.5–5.1)
SODIUM: 142 mmol/L (ref 135–145)
TCO2: 24 mmol/L (ref 0–100)

## 2015-12-14 MED ORDER — AMMONIA AROMATIC IN INHA
RESPIRATORY_TRACT | Status: AC
  Filled 2015-12-14: qty 10

## 2015-12-14 MED ORDER — LEVETIRACETAM 500 MG PO TABS: 500.0000 mg | ORAL_TABLET | Freq: Two times a day (BID) | ORAL | Status: DC

## 2015-12-14 MED ORDER — LEVETIRACETAM IN NACL 1000 MG/100ML IV SOLN
1000.0000 mg | Freq: Once | INTRAVENOUS | Status: AC
  Administered 2015-12-14: 1000 mg via INTRAVENOUS
  Filled 2015-12-14: qty 100

## 2015-12-14 NOTE — ED Notes (Signed)
Pt AOX4. Pt now responding. Pt given lunch meal tray with MD approval. No neuro deficits noted at this time. Pt reports that he has no transportation home. Agricultural consultant notified.

## 2015-12-14 NOTE — ED Notes (Signed)
Pt jerked head back after ammonia. Pt continues to appear post-ictal and non-verbal. Pt alert, but will not respond to questions.

## 2015-12-14 NOTE — ED Notes (Signed)
Went into pt's room to begin going over d/c instructions.  Pt found to be removing leads and BP cuff. Pt them attempted to removed his IV. When instructed to stop and let an RN do it, but ignored and continued to pull at IV. Anderson Malta, RN at bedside to assist.   Pt began acting erratic. Pt quickly came out of the bed pushing RN's aside, stating "let me leave, my ride is out front," even though there was nobody. Informed pt that staff was working to get pt home via EMS for his safety since he had no other transportation home. Pt noted to be extremely wobbly. Attempted to get pt back into bed, but pt began fighting RN's attempting to push RN's out of the way. Security notified.   Unable to keep patient in room. Pt escorted to waiting area with two security guards. Per security, pt began to stumble and was assisted to the ground by security. Staff and Dr. Thurnell Garbe notified and responded immediately, placing pt in wheelchair. Pt continued to act erratic and violent. Pt continued to push staff, even while in wheelchair. [employer] responded. [employer] escorted pt out of the hospital.
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
## 2015-12-14 NOTE — ED Notes (Signed)
Keppra infusion completed.
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
## 2015-12-14 NOTE — ED Provider Notes (Signed)
CSN: [id number]     Arrival date & time 12/14/15  1956 History  By signing my name below, I, Ephriam Jenkins, attest that this documentation has been prepared under the direction and in the presence of [name], MD. Electronically signed, Ephriam Jenkins, ED Scribe. 12/14/2015. 8:52 PM.   Chief Complaint  Patient presents with  . V70.1    The history is provided by the patient and the EMS personnel. No language interpreter was used.   HPI Comments: Franklin [name] is a 35 y.o. male with a PMHx of seizure disorder, brought in by ambulance, who presents to the Emergency Department complaining of having multiple seizures today. Pt states he 3 seizures "back to back". Pt states was in the ED here earlier today but does not know exactly what time. Pt was admitted with the same chief complaints. Pt normally takes Kepra bid for his seizure disorder but is unsure of the dosage. Pt states that he has not been taking it for the past 3 days because he has not had access to it. Pt states that his Andres Labrum is at his cousins house and reports that he has been unable to retrieve it.   Pt states he came back to this hospital because he had another seizure episode. Pt reports his last seizure before today was 2-3 days ago. Per security, pt was here earlier and left AMA. They report that pt has been fairly calm since returning.    Past Medical History  Diagnosis Date  . Depression   . Bipolar 1 disorder ([place])   . Schizophrenic disorder ([place])   . Chronic headaches   . Mild cognitive impairment   . Hx of electroencephalogram 12/2014    normal  . Seizures ([place])   . 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 No pertinent past surgical history. Family History  Problem Relation Age of Onset  . Heart disease Mother    Social History  Substance Use Topics  . Smoking status: Never Smoker   . Smokeless tobacco: Never Used  .  Alcohol Use: No    Review of Systems A complete 10 system review of systems was obtained and all systems are negative except as noted in the HPI and PMH.   Allergies  Coconut flavor; Codeine; Penicillins; Depakote; and Tape  Home Medications   Prior to Admission medications   Medication Sig Start Date End Date Taking? Authorizing Provider  acetaminophen (TYLENOL) 500 MG tablet Take 1,000 mg by mouth every 6 (six) hours as needed for mild pain or headache.     Historical Provider, MD  albuterol (PROVENTIL HFA;VENTOLIN HFA) 108 (90 Base) MCG/ACT inhaler Inhale 1-2 puffs into the lungs every 6 (six) hours as needed for wheezing or shortness of breath. 09/25/15   Melynda Ripple, MD  fluticasone (FLONASE) 50 MCG/ACT nasal spray Place 2 sprays into both nostrils daily as needed for allergies.  10/01/15   Historical Provider, MD  ibuprofen (ADVIL,MOTRIN) 800 MG tablet Take 1 tablet (800 mg total) by mouth 3 (three) times daily. 12/12/15   Tammy Triplett, PA-C  levETIRAcetam (KEPPRA) 500 MG tablet Take 1 tablet (500 mg total) by mouth 2 (two) times daily. 12/01/15   Kathie Dike, MD  levETIRAcetam (KEPPRA) 500 MG tablet Take 1 tablet (500 mg total) by mouth 2 (two) times daily. 12/14/15   Francine Graven, DO  loratadine (CLARITIN) 10 MG tablet Take 1 tablet (10  mg total) by mouth daily. Patient not taking: Reported on 12/14/2015 09/25/15   Melynda Ripple, MD  QUEtiapine Fumarate (SEROQUEL XR) 150 MG 24 hr tablet Take 150 mg by mouth at bedtime.    Historical Provider, MD   BP 129/74 mmHg  Pulse 61  Temp(Src) 98.7 F (37.1 C) (Oral)  Resp 20  Ht 6' (1.829 m)  Wt 155 lb (70.308 kg)  BMI 21.02 kg/m2  SpO2 99% Physical Exam  Constitutional: He is oriented to person, place, and time. He appears well-developed and well-nourished.  Answers questions briefly   HENT:  Head: Normocephalic and atraumatic.  Right Ear: External ear normal.  Left Ear: External ear normal.  Eyes: Conjunctivae are  normal. No scleral icterus.  Neck: No tracheal deviation present.  Pulmonary/Chest: Effort normal. No respiratory distress.  Abdominal: He exhibits no distension.  Musculoskeletal: Normal range of motion.  Neurological: He is alert and oriented to person, place, and time.  Skin: Skin is warm and dry.  Psychiatric:  Appears depressed   Nursing note and vitals reviewed.   ED Course  Procedures  DIAGNOSTIC STUDIES: Oxygen Saturation is 99% on RA, normal by my interpretation.  COORDINATION OF CARE: 8:46 PM-Discussed treatment plan with pt at bedside and pt agreed to plan.   Labs Review Labs Reviewed - No data to display  Imaging Review Ct Head Wo Contrast  12/14/2015  CLINICAL DATA:  Per EMS, pt had multiple petite mal seizures. States pt will stare off and tense up for approx 5-6 seconds. Pt has been non-verbal. Pt appears to be post-ictal. Pt hx of seizures and has been off medication since Friday. EXAM: CT HEAD WITHOUT CONTRAST TECHNIQUE: Contiguous axial images were obtained from the base of the skull through the vertex without intravenous contrast. COMPARISON:  11/30/2015 FINDINGS: There is no intra or extra-axial fluid collection or mass lesion. The basilar cisterns and ventricles have a normal appearance. There is no CT evidence for acute infarction or hemorrhage. Bone Windows are unremarkable. There is cerumen within the bilateralexternal auditory canals. The paranasal and mastoid air cells are normally aerated. IMPRESSION: No evidence for acute intracranial abnormality. Electronically Signed   By: Nolon Nations M.D.   On: 12/14/2015 14:37   I have personally reviewed and evaluated these images and lab results as part of my medical decision-making.   EKG Interpretation None      MDM   Final diagnoses:  Seizure disorder ([place])  Bipolar affective disorder, most recent episode unspecified type, remission status unspecified   Second visit to the emergency department today.  He left earlier in a fit of anger and was confrontational.  He allegedly had another seizure tonight. Will obtain behavioral health consult.  2320:   Discuss with behavioral health. Will keep patient overnight and reassess in the morning.  I personally performed the services described in this documentation, which was scribed in my presence. The recorded information has been reviewed and is accurate.      [name], MD 12/14/15 [phone]
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
## 2015-12-14 NOTE — ED Notes (Signed)
Per EMS, pt had multiple petite mal seizures. States pt will stare off and tense up for approx 5-6 seconds. Pt has been non-verbal. Pt appears to be post-ictal. Pt hx of seizures and has been off medication since Friday.

## 2015-12-14 NOTE — ED Notes (Signed)
Pt left d/c papers in ED.

## 2015-12-14 NOTE — Discharge Instructions (Signed)
Take the prescription as directed.  Call your regular medical doctor and your Neurologist tomorrow to schedule a follow up appointment this week.  Return to the Emergency Department immediately sooner if worsening.
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
## 2015-12-14 NOTE — ED Notes (Signed)
Unable to go over d/c instructions and perform d/c vitals.

## 2015-12-15 DIAGNOSIS — F3175 Bipolar disorder, in partial remission, most recent episode depressed: Secondary | ICD-10-CM | POA: Diagnosis not present

## 2015-12-15 LAB — URINALYSIS, ROUTINE W REFLEX MICROSCOPIC
GLUCOSE, UA: NEGATIVE mg/dL
HGB URINE DIPSTICK: NEGATIVE
Ketones, ur: NEGATIVE mg/dL
Leukocytes, UA: NEGATIVE
Nitrite: NEGATIVE
PH: 6 (ref 5.0–8.0)

## 2015-12-15 LAB — RAPID URINE DRUG SCREEN, HOSP PERFORMED
AMPHETAMINES: NOT DETECTED
BENZODIAZEPINES: NOT DETECTED
Barbiturates: NOT DETECTED
COCAINE: NOT DETECTED
Opiates: NOT DETECTED
Tetrahydrocannabinol: NOT DETECTED

## 2015-12-15 LAB — URINE MICROSCOPIC-ADD ON
BACTERIA UA: NONE SEEN
SQUAMOUS EPITHELIAL / LPF: NONE SEEN
WBC, UA: NONE SEEN WBC/hpf (ref 0–5)

## 2015-12-15 NOTE — ED Notes (Signed)
Pt given meal tray.

## 2015-12-15 NOTE — ED Notes (Signed)
Pt belongings placed in a bag and put into locker room. Pt was dressed in purple scrubs and socks. Pt kept his glasses.

## 2015-12-15 NOTE — Consult Note (Signed)
Telepsych Consultation   Reason for Consult:  Suicidal ideation  Referring Physician: EDP Patient Identification: Franklin [name] MRN:  [id number] Principal Diagnosis: Bipolar affective disorder [facility]) Diagnosis:   Patient Active Problem List   Diagnosis Date Noted  . Abnormal EKG [R94.31] 11/30/2015  . Elevated troponin [R79.89] 11/30/2015  . Bipolar affective disorder, most recent episode unspecified type, remission status unspecified [F31.9] 03/11/2015  . Bipolar affective disorder ([place]) [F31.9] 01/08/2015  . Generalized idiopathic epilepsy and epileptic syndromes, without status epilepticus, not intractable ([place]) [G40.309] 11/27/2014  . Seizure disorder [facility]) [id number] 10/14/2014    Total Time spent with patient: 30 minutes  Subjective:   Franklin [name] is a 35 y.o. male patient admitted with complaints of seizure but expressed suicidal ideation to EMS.   HPI:    Per admission tele assessment 12/14/2015:   Franklin [name] is a 35 y.o. male with a PMHx of seizure disorder, brought in by ambulance, who presents to the Emergency Department complaining of having multiple seizures today. Pt states he 3 seizures "back to back". Pt states was in the ED here earlier today but does not know exactly what time. Pt was admitted with the same chief complaints. Pt normally takes Kepra bid for his seizure disorder but is unsure of the dosage. Pt states that he has not been taking it for the past 3 days because he has not had access to it. Pt states that his Andres Labrum is at his cousins house and reports that he has been unable to retrieve it. Pt states he came back to this hospital because he had another seizure episode. Pt reports his last seizure before today was 2-3 days ago. Per security, pt was here earlier and left AMA.    Today 12/15/2015:   Patient is cooperative with assessment but is noted to provide very vague answers to questions. He denies any past attempts. When asked about suicidal thoughts  today patient denied but reported that it happened "sometimes." Patient denies any psychotic symptoms today. He reports that he is seen at [facility] for management of Bipolar Disorder for which he is prescribed Seroquel XR. Patient reports that he did not have access to his seizure medication Keppra because "I left it at my cousin's house and he went out of town." Patient reports having a seizure yesterday. Franklin [name] was fully alert and oriented during the assessment. He did not appear to be in any acute distress. Patient was willing to follow up outpatient after being medically managed in the [facility] ED.   Past Psychiatric History: Bipolar Disorder   Risk to Self: Suicidal Ideation: No-Not Currently/Within Last 6 Months Suicidal Intent: No-Not Currently/Within Last 6 Months Is patient at risk for suicide?: Yes (Pt denying current SI but told EMS and PD he was.) Suicidal Plan?: No-Not Currently/Within Last 6 Months Access to Means: Yes Specify Access to Suicidal Means: Pt says "I don't know." What has been your use of drugs/alcohol within the last 12 months?: Denies How many times?: 0 Other Self Harm Risks: Pt denies Triggers for Past Attempts: None known Intentional Self Injurious Behavior: None Risk to Others: Homicidal Ideation: No Thoughts of Harm to Others: No Current Homicidal Intent: No Current Homicidal Plan: No Access to Homicidal Means: No Identified Victim: No one History of harm to others?: Yes Assessment of Violence: On admission Violent Behavior Description: Earlier today was pushing people Does patient have access to weapons?: No Criminal Charges Pending?: No Does patient have a court date: No Prior Inpatient Therapy: Prior  Inpatient Therapy: Yes Prior Therapy Dates: [facility] Prior Therapy Facilty/Provider(s): August 2016 Reason for Treatment: Cannot remember Prior Outpatient Therapy: Prior Outpatient Therapy: Yes Prior Therapy Dates: Pt unclear Prior Therapy  Facilty/Provider(s): Pt unclear Reason for Treatment: Pt unclear Does patient have an ACCT team?: No Does patient have Intensive In-House Services?  : No Does patient have [facility] services? : No Does patient have [facility] services?: No  Past Medical History:  Past Medical History  Diagnosis Date  . Depression   . Bipolar 1 disorder ([place])   . Schizophrenic disorder ([place])   . Chronic headaches   . Mild cognitive impairment   . Hx of electroencephalogram 12/2014    normal  . Seizures ([place])   . 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 No pertinent past surgical history. Family History:  Family History  Problem Relation Age of Onset  . Heart disease Mother    Family Psychiatric  History: Denies Social History:  History  Alcohol Use No     History  Drug Use No    Social History   Social History  . Marital Status: Single    Spouse Name: N/A  . Number of Children: N/A  . Years of Education: N/A   Social History Main Topics  . Smoking status: Never Smoker   . Smokeless tobacco: Never Used  . Alcohol Use: No  . Drug Use: No  . Sexual Activity: Not Currently   Other Topics Concern  . None   Social History Narrative   Additional Social History:    Allergies:   Allergies  Allergen Reactions  . Coconut Flavor Nausea And Vomiting  . Codeine Nausea And Vomiting  . Penicillins Nausea And Vomiting    Has patient had a PCN reaction causing immediate rash, facial/tongue/throat swelling, SOB or lightheadedness with hypotension: No Has patient had a PCN reaction causing severe rash involving mucus membranes or skin necrosis: No Has patient had a PCN reaction that required hospitalization No Has patient had a PCN reaction occurring within the last 10 years: No If all of the above answers are "NO", then may proceed with Cephalosporin use.    . Depakote [Divalproex Sodium] Other  (See Comments)    Pt states that this medication makes him feel like he is out of it.   . Tape Other (See Comments)    Reaction:  Tears skin  Pt is able to use paper tape.      Labs:  Results for orders placed or performed during the hospital encounter of 12/14/15 (from the past 48 hour(s))  Rapid urine drug screen (hospital performed)     Status: None   Collection Time: 12/15/15  8:26 AM  Result Value Ref Range   Opiates NONE DETECTED NONE DETECTED   Cocaine NONE DETECTED NONE DETECTED   Benzodiazepines NONE DETECTED NONE DETECTED   Amphetamines NONE DETECTED NONE DETECTED   Tetrahydrocannabinol NONE DETECTED NONE DETECTED   Barbiturates NONE DETECTED NONE DETECTED    Comment:        DRUG SCREEN FOR MEDICAL PURPOSES ONLY.  IF CONFIRMATION IS NEEDED FOR ANY PURPOSE, NOTIFY LAB WITHIN 5 DAYS.        LOWEST DETECTABLE LIMITS FOR URINE DRUG SCREEN Drug Class       Cutoff (ng/mL) Amphetamine      1000 Barbiturate      200 Benzodiazepine   [id number] Tricyclics  300 Opiates          300 Cocaine          300 THC              50   Urinalysis, Routine w reflex microscopic (not at [facility])     Status: Abnormal   Collection Time: 12/15/15  8:26 AM  Result Value Ref Range   Color, Urine YELLOW YELLOW   APPearance CLEAR CLEAR   Specific Gravity, Urine >1.030 (H) 1.005 - 1.030   pH 6.0 5.0 - 8.0   Glucose, UA NEGATIVE NEGATIVE mg/dL   Hgb urine dipstick NEGATIVE NEGATIVE   Bilirubin Urine SMALL (A) NEGATIVE   Ketones, ur NEGATIVE NEGATIVE mg/dL   Protein, ur TRACE (A) NEGATIVE mg/dL   Nitrite NEGATIVE NEGATIVE   Leukocytes, UA NEGATIVE NEGATIVE  Urine microscopic-add on     Status: Abnormal   Collection Time: 12/15/15  8:26 AM  Result Value Ref Range   Squamous Epithelial / LPF NONE SEEN NONE SEEN   WBC, UA NONE SEEN 0 - 5 WBC/hpf   RBC / HPF 0-5 0 - 5 RBC/hpf   Bacteria, UA NONE SEEN NONE SEEN   Casts HYALINE CASTS (A) NEGATIVE    No current facility-administered medications  for this encounter.   Current Outpatient Prescriptions  Medication Sig Dispense Refill  . acetaminophen (TYLENOL) 500 MG tablet Take 1,000 mg by mouth every 6 (six) hours as needed for mild pain or headache.     . albuterol (PROVENTIL HFA;VENTOLIN HFA) 108 (90 Base) MCG/ACT inhaler Inhale 1-2 puffs into the lungs every 6 (six) hours as needed for wheezing or shortness of breath. 1 Inhaler 0  . fluticasone (FLONASE) 50 MCG/ACT nasal spray Place 2 sprays into both nostrils daily as needed for allergies.   0  . ibuprofen (ADVIL,MOTRIN) 800 MG tablet Take 1 tablet (800 mg total) by mouth 3 (three) times daily. 21 tablet 0  . levETIRAcetam (KEPPRA) 500 MG tablet Take 1 tablet (500 mg total) by mouth 2 (two) times daily. 60 tablet 0  . QUEtiapine Fumarate (SEROQUEL XR) 150 MG 24 hr tablet Take 150 mg by mouth at bedtime.    . levETIRAcetam (KEPPRA) 500 MG tablet Take 1 tablet (500 mg total) by mouth 2 (two) times daily. 30 tablet 0  . loratadine (CLARITIN) 10 MG tablet Take 1 tablet (10 mg total) by mouth daily. (Patient not taking: Reported on 12/14/2015) 30 tablet 0    Musculoskeletal:  Unable to assess via camera   Psychiatric Specialty Exam: Physical Exam  Review of Systems  Psychiatric/Behavioral: Negative for depression, suicidal ideas, hallucinations, memory loss and substance abuse. The patient is not nervous/anxious and does not have insomnia.     Blood pressure 97/49, pulse 48, temperature 97.8 F (36.6 C), temperature source Oral, resp. rate 18, height 6' (1.829 m), weight 70.308 kg (155 lb), SpO2 98 %.Body mass index is 21.02 kg/(m^2).  General Appearance: Casual  Eye Contact:  Fair  Speech:  Clear and Coherent and Slow  Volume:  Decreased  Mood:  Euthymic  Affect:  Constricted  Thought Process:  Coherent  Orientation:  Full (Time, Place, and Person)  Thought Content:  WDL  Suicidal Thoughts:  No  Homicidal Thoughts:  No  Memory:  Immediate;   Fair Recent;   Fair Remote;    Fair  Judgement:  Fair  Insight:  Shallow  Psychomotor Activity:  Normal  Concentration:  Concentration: Poor and Attention Span: Fair  Recall:  Becker of Knowledge:  Fair  Language:  Fair  Akathisia:  No  Handed:  Right  AIMS (if indicated):     Assets:  Catering manager Housing Leisure Time Resilience Social Support  ADL's:  Intact  Cognition:  WNL  Sleep:        Treatment Plan Summary: Patient is not determined to be a danger to himself. He denies having a plan or any past attempts. Patient is stable to follow up with [facility] after he is medically cleared due to recent seizure activity.   Disposition: No evidence of imminent risk to self or others at present.   Patient does not meet criteria for psychiatric inpatient admission. Supportive therapy provided about ongoing stressors. Discussed crisis plan, support from social network, calling 911, coming to the Emergency Department, and calling Suicide Hotline.  Elmarie Shiley, NP 12/15/2015 11:11 AM

## 2015-12-15 NOTE — ED Notes (Signed)
TTS machine at bedside. 

## 2015-12-15 NOTE — Discharge Instructions (Signed)
If you were given medicines take as directed.  If you are on coumadin or contraceptives realize their levels and effectiveness is altered by many different medicines.  If you have any reaction (rash, tongues swelling, other) to the medicines stop taking and see a physician.    If your blood pressure was elevated in the ER make sure you follow up for management with a primary doctor or return for chest pain, shortness of breath or stroke symptoms.  Please follow up as directed and return to the ER or see a physician for new or worsening symptoms.  Thank you. Filed Vitals:   12/14/15 2004 12/15/15 0217 12/15/15 0830  BP: 129/74 100/62 97/49  Pulse: 61 60 48  Temp: 98.7 F (37.1 C)  97.8 F (36.6 C)  TempSrc: Oral  Oral  Resp: 20 16 18   Height: 6' (1.829 m)    Weight: 155 lb (70.308 kg)    SpO2: 99% 98% 98%

## 2015-12-15 NOTE — ED Notes (Signed)
PT stated he didn't want to wait on his paperwork and was demanding to leave at this time. PT given his personal belongings and pt ambulated outside with security at this time. PT refused to sign for discharge.

## 2015-12-16 ENCOUNTER — Ambulatory Visit (INDEPENDENT_AMBULATORY_CARE_PROVIDER_SITE_OTHER): Payer: Medicaid Other | Admitting: Neurology

## 2015-12-16 ENCOUNTER — Encounter: Payer: Self-pay | Admitting: Neurology

## 2015-12-16 VITALS — BP 122/70 | HR 54 | Temp 98.3°F | Ht 72.0 in | Wt 161.0 lb

## 2015-12-16 DIAGNOSIS — F319 Bipolar disorder, unspecified: Secondary | ICD-10-CM

## 2015-12-16 DIAGNOSIS — G40309 Generalized idiopathic epilepsy and epileptic syndromes, not intractable, without status epilepticus: Secondary | ICD-10-CM

## 2015-12-16 MED ORDER — LEVETIRACETAM 1000 MG PO TABS: 1000.0000 mg | ORAL_TABLET | Freq: Two times a day (BID) | ORAL | Status: DC

## 2015-12-16 NOTE — Progress Notes (Signed)
NEUROLOGY FOLLOW UP OFFICE NOTE  Franklin [name] [id number]  HISTORY OF PRESENT ILLNESS: I had the pleasure of seeing Franklin [name] in follow-up in the neurology clinic on 12/16/2015.  The patient was last seen 9 months ago for seizures. He is a poor historian with a history of medication non-compliance. Records and images were personally reviewed where available. His 1-hour sleep-deprived EEG was normal. He was unable to do MRI brain even with open MRI due to significant claustrophobia. Since his last visit, he has been back to the ER multiple times with report of seizures. At some point, Lamictal was stopped, and he was started on Keppra in March 2017. He was back in the ER 2 days ago for seizure, reporting he missed his medication. He presents today stating he did not miss any doses. He is on Keppra 500mg  BID. He was evaluated by Psychiatry during his last ER visit, with a diagnosis of Bipolar disorder, to follow-up with [facility]. He reports mood is up and down. He has occasional dizziness. He denies any headaches, vision changes, focal numbness/tingling/weakness.   HPI: This is a 35 yo RH man with a history of bipolar disorder, mild cognitive impairment, and seizures since age 17 or 13. He and his cousin both report that seizures would start with staring and unresponsiveness, followed by low amplitude shaking of both hands. Sometimes he would have violent leg kicking. They state that shaking last from 30 minutes up to "1-1/2 hours" followed by confusion for around 20 minutes. He would be sleepy after a seizure. He has had urinary incontinence with some of them, no tongue bite or other significant injuries. He reports all seizures occur during wakefulness, no nocturnal seizures that he is aware of, but woke up one time with urinary incontinence. He denies any olfactory/gustatory hallucinations, deja vu, rising epigastric sensation, focal numbness/tingling/weakness, myoclonic jerks. He occasionally drops  things from his hands. He reports that Depakote was started at age 60, however he has only been taking 250mg /day, stating that when he was on 500 or 750mg , he would be "like a zombie, walking into doors." The low dose Depakote has not controlled the seizures, he reports seizures occurring every 3-4 days, longest seizure-free interval probably 1-2 months.   On review of records on EP[facility], he has been to the ER several times since 2008 with no note of a seizure history until 2014. There are some records of patient being on Depakote, Tegretol, and Haldol, presumably for Bipolar disorder. He reports chronic headaches occurring 2-3 times a week with sharp pain in the back of his head, "like a needle sticking in it" lasting 30-45 minutes. He would feel lightheaded. No associated nausea, vomiting, photo/phonophobia, or visual obscurations. He takes Tylenol or aspirin with good effect. He lives with his girlfriend. He finished 12th grade in special education classes. He does not drive.  Epilepsy Risk Factors: His maternal uncle used to have seizures. He was born premature, with mild cognitive impairment. Otherwise he denies any history of febrile convulsions, CNS infections such as meningitis/encephalitis, significant traumatic brain injury, neurosurgical procedures.  PAST MEDICAL HISTORY: Past Medical History  Diagnosis Date  . Depression   . Bipolar 1 disorder ([place])   . Schizophrenic disorder ([place])   . Chronic headaches   . Mild cognitive impairment   . Hx of electroencephalogram 12/2014    normal  . Seizures ([place])   . 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 albuterol (PROVENTIL HFA;VENTOLIN HFA) 108 (90 BASE) MCG/ACT inhaler Inhale 2 puffs into the lungs every 6 (six) hours as needed for wheezing or shortness of  breath (asthma symptoms).    . divalproex (DEPAKOTE) 250 MG DR tablet Take 1 tablet (250 mg total) by mouth daily. (not taking) 60 tablet 0  . ibuprofen (ADVIL,MOTRIN) 400 MG tablet Take 1 tablet (400 mg total) by mouth every 6 (six) hours as needed. (Patient taking differently: Take 400 mg by mouth every 6 (six) hours as needed for moderate pain. ) 30 tablet 0  . lamoTRIgine (LAMICTAL) 100 MG tablet Take 2 tablets (200 mg total) by mouth daily (reports only taking 1 tablet at night) 60 tablet 0  . ondansetron (ZOFRAN ODT) 4 MG disintegrating tablet Take 1 tablet (4 mg total) by mouth every 8 (eight) hours as needed for nausea or vomiting. 20 tablet 0   No current facility-administered medications on file prior to visit.    ALLERGIES: Allergies  Allergen Reactions  . Coconut Flavor Nausea And Vomiting  . Codeine Nausea And Vomiting  . Penicillins Nausea And Vomiting    Has patient had a PCN reaction causing immediate rash, facial/tongue/throat swelling, SOB or lightheadedness with hypotension: No Has patient had a PCN reaction causing severe rash involving mucus membranes or skin necrosis: No Has patient had a PCN reaction that required hospitalization No Has patient had a PCN reaction occurring within the last 10 years: No If all of the above answers are "NO", then may proceed with Cephalosporin use.    . Depakote [Divalproex Sodium] Other (See Comments)    Pt states that this medication makes him feel like he is out of it.   . Tape Other (See Comments)    Reaction:  Tears skin  Pt is able to use paper tape.      FAMILY HISTORY: Family History  Problem Relation Age of Onset  . Heart disease Mother     SOCIAL HISTORY: Social History   Social History  . Marital Status: Single    Spouse Name: N/A  . Number of Children: N/A  . Years of Education: N/A   Occupational History  . Not on file.   Social History Main Topics  . Smoking status: Never Smoker   . Smokeless tobacco:  Never Used  . Alcohol Use: No  . Drug Use: No  . Sexual Activity: Not Currently   Other Topics Concern  . Not on file   Social History Narrative    REVIEW OF SYSTEMS: Constitutional: No fevers, chills, or sweats, no generalized fatigue, change in appetite Eyes: No visual changes, double vision, eye pain Ear, nose and throat: No hearing loss, ear pain, nasal congestion, sore throat Cardiovascular: No chest pain, palpitations Respiratory:  No shortness of breath at rest or with exertion, wheezes GastrointestinaI: No nausea, vomiting, diarrhea, abdominal pain, fecal incontinence Genitourinary:  No dysuria, urinary retention or frequency Musculoskeletal:  No neck pain, back pain Integumentary: No rash, pruritus, skin lesions Neurological: as above Psychiatric: No depression, insomnia, anxiety Endocrine: No palpitations, fatigue, diaphoresis, mood swings, change in appetite, change in weight, increased thirst Hematologic/Lymphatic:  No anemia, purpura, petechiae. Allergic/Immunologic: no itchy/runny eyes, nasal congestion, recent allergic reactions, rashes  PHYSICAL EXAM: Filed Vitals:   12/16/15 1406  BP: 122/70  Pulse: 54  Temp: 98.3 F (36.8 C)   General: No acute distress  Head:  Normocephalic/atraumatic Neck: supple, no paraspinal tenderness, full range of motion Heart:  Regular rate and rhythm Lungs:  Clear to auscultation bilaterally Back: No paraspinal tenderness Skin/Extremities: No rash, no edema Neurological Exam: alert and oriented to person, place, and time. No aphasia or dysarthria. Fund of knowledge is appropriate.  Recent and remote memory are intact. 3/3 delayed recall.  Attention and concentration are normal.    Able to name objects and repeat phrases. Cranial nerves: Pupils equal, round, reactive to light.  Extraocular movements intact with no nystagmus. Visual fields full. Facial sensation intact. No facial asymmetry. Tongue, uvula, palate midline.  Motor: Bulk  and tone normal, muscle strength 5/5 throughout with no pronator drift.  Sensation to light touch intact.  No extinction to double simultaneous stimulation.  Deep tendon reflexes 2+ throughout, toes downgoing.  Finger to nose testing intact.  Gait wide-based, no ataxia. Romberg negative.  IMPRESSION: This is a 35 yo RH man with a history of bipolar disorder, mild cognitive impairment, with a report of history of seizures since age 12 or 26. Review of records from ER visits in the past have no indication of seizures or seizure medication until 2014. He states he has never seen a neurologist in the past. Seizures consist of staring followed by shaking, he has had urinary incontinence with some of them, however the prolonged duration of some (lasting 1-1/2 hours) also raises the possibility of non-epileptic events. 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 His EEG is normal, unable to do MRI brain due to severe claustrophobia. He has been to the ER multiple times for seizures, and at some point switched from Lamictal to [place]. Last ER visit for seizure was 12/14/15. Keppra dose will be increased to 1000mg  BID. Side effects were discussed. At this point, he would benefit from inpatient video EEG monitoring to classify his seizures. Referral will be sent. He was again encouraged to follow-up with Behavioral Medicine for Bipolar disorder. He does not drive. He will follow-up in 4 months and knows to call our office for any changes.   Thank you for allowing me to participate in his care.  Please do not hesitate to call for any questions or concerns.  The duration of this appointment visit was 24 minutes of face-to-face time with the patient.  Greater than 50% of this time was spent in counseling, explanation of diagnosis, planning of further management, and coordination of care.   Ellouise Newer, M.D.

## 2015-12-16 NOTE — Patient Instructions (Signed)
1. Increase Keppra: With your current bottle of Keppra 500mg  tablets, take 2 tablets twice a day. Once you are done with this bottle, your new prescription will be for Keppra 1000mg : Take 1 tablet twice a day 2. Refer to [facility] for inpatient video EEG monitoring 3. Follow-up in 4 months  Seizure Precautions: 1. If medication has been prescribed for you to prevent seizures, take it exactly as directed.  Do not stop taking the medicine without talking to your doctor first, even if you have not had a seizure in a long time.   2. Avoid activities in which a seizure would cause danger to yourself or to others.  Don't operate dangerous machinery, swim alone, or climb in high or dangerous places, such as on ladders, roofs, or girders.  Do not drive unless your doctor says you may.  3. If you have any warning that you may have a seizure, lay down in a safe place where you can't hurt yourself.    4.  No driving for 6 months from last seizure, as per [facility].   Please refer to the following link on the [place] website for more information: http://www.epilepsyfoundation.org/answerplace/Social/driving/drivingu.cfm   5.  Maintain good sleep hygiene. Avoid alcohol.  6.  Contact your doctor if you have any problems that may be related to the medicine you are taking.  7.  Call 911 and bring the patient back to the ED if:        A.  The seizure lasts longer than 5 minutes.       B.  The patient doesn't awaken shortly after the seizure  C.  The patient has new problems such as difficulty seeing, speaking or moving  D.  The patient was injured during the seizure  E.  The patient has a temperature over 102 F (39C)  F.  The patient vomited and now is having trouble breathing

## 2015-12-22 ENCOUNTER — Telehealth: Payer: Self-pay | Admitting: Neurology

## 2015-12-22 NOTE — Telephone Encounter (Signed)
Notified pt that all information was sent to [facility], that they will be calling him with an appt

## 2015-12-22 NOTE — Telephone Encounter (Signed)
Franklin [name] called wanting to know if his test has been set up at [facility].

## 2015-12-25 ENCOUNTER — Telehealth: Payer: Self-pay | Admitting: Neurology

## 2015-12-25 NOTE — Telephone Encounter (Signed)
Referral faxed to [facility] by [place]ber[place], CMA at [phone] with confirmation received for inpatient EEG monitoring. They will call patient to schedule.

## 2016-01-05 ENCOUNTER — Encounter (HOSPITAL_COMMUNITY): Payer: Self-pay

## 2016-01-05 ENCOUNTER — Emergency Department (HOSPITAL_COMMUNITY)
Admission: EM | Admit: 2016-01-05 | Discharge: 2016-01-05 | Disposition: A | Discharge status: Home / Self Care | Payer: Medicaid Other | Attending: Emergency Medicine | Admitting: Emergency Medicine

## 2016-01-05 DIAGNOSIS — K2970 Gastritis, unspecified, without bleeding: Secondary | ICD-10-CM | POA: Insufficient documentation

## 2016-01-05 DIAGNOSIS — Z791 Long term (current) use of non-steroidal anti-inflammatories (NSAID): Secondary | ICD-10-CM | POA: Insufficient documentation

## 2016-01-05 DIAGNOSIS — Z79899 Other long term (current) drug therapy: Secondary | ICD-10-CM | POA: Diagnosis not present

## 2016-01-05 DIAGNOSIS — R1013 Epigastric pain: Secondary | ICD-10-CM | POA: Diagnosis present

## 2016-01-05 LAB — COMPREHENSIVE METABOLIC PANEL
ALBUMIN: 4.9 g/dL (ref 3.5–5.0)
ALK PHOS: 66 U/L (ref 38–126)
ALT: 15 U/L — AB (ref 17–63)
AST: 15 U/L (ref 15–41)
Anion gap: 5 (ref 5–15)
BILIRUBIN TOTAL: 1.4 mg/dL — AB (ref 0.3–1.2)
BUN: 12 mg/dL (ref 6–20)
CALCIUM: 9.6 mg/dL (ref 8.9–10.3)
CO2: 30 mmol/L (ref 22–32)
CREATININE: 0.95 mg/dL (ref 0.61–1.24)
Chloride: 103 mmol/L (ref 101–111)
GFR calc Af Amer: >60 mL/min (ref >60)
GFR calc non Af Amer: >60 mL/min (ref >60)
GLUCOSE: 98 mg/dL (ref 65–99)
Potassium: 4.2 mmol/L (ref 3.5–5.1)
SODIUM: 138 mmol/L (ref 135–145)
TOTAL PROTEIN: 8.1 g/dL (ref 6.5–8.1)

## 2016-01-05 LAB — CBC
HCT: 46.7 % (ref 39.0–52.0)
HEMOGLOBIN: 16 g/dL (ref 13.0–17.0)
MCH: 29.9 pg (ref 26.0–34.0)
MCHC: 34.3 g/dL (ref 30.0–36.0)
MCV: 87.3 fL (ref 78.0–100.0)
Platelets: 194 10*3/uL (ref 150–400)
RBC: 5.35 MIL/uL (ref 4.22–5.81)
RDW: 12.8 % (ref 11.5–15.5)
WBC: 7.8 10*3/uL (ref 4.0–10.5)

## 2016-01-05 LAB — LIPASE, BLOOD: Lipase: 22 U/L (ref 11–51)

## 2016-01-05 LAB — URINALYSIS, ROUTINE W REFLEX MICROSCOPIC
Bilirubin Urine: NEGATIVE
GLUCOSE, UA: NEGATIVE mg/dL
Hgb urine dipstick: NEGATIVE
Ketones, ur: NEGATIVE mg/dL
LEUKOCYTES UA: NEGATIVE
NITRITE: NEGATIVE
PH: 7.5 (ref 5.0–8.0)
Protein, ur: NEGATIVE mg/dL
SPECIFIC GRAVITY, URINE: 1.025 (ref 1.005–1.030)

## 2016-01-05 MED ORDER — SUCRALFATE 1 G PO TABS: 1.0000 g | ORAL_TABLET | Freq: Four times a day (QID) | ORAL | 0 refills | Status: DC

## 2016-01-05 MED ORDER — FAMOTIDINE 20 MG PO TABS: 20.0000 mg | ORAL_TABLET | Freq: Two times a day (BID) | ORAL | 0 refills | Status: DC

## 2016-01-05 MED ORDER — GI COCKTAIL ~~LOC~~
30.0000 mL | Freq: Once | ORAL | Status: AC
  Administered 2016-01-05: 30 mL via ORAL
  Filled 2016-01-05: qty 30

## 2016-01-05 MED ORDER — SUCRALFATE 1 G PO TABS
1.0000 g | ORAL_TABLET | Freq: Once | ORAL | Status: AC
  Administered 2016-01-05: 1 g via ORAL
  Filled 2016-01-05: qty 1

## 2016-01-05 MED ORDER — FAMOTIDINE 20 MG PO TABS
40.0000 mg | ORAL_TABLET | Freq: Once | ORAL | Status: AC
  Administered 2016-01-05: 40 mg via ORAL
  Filled 2016-01-05: qty 2

## 2016-01-05 MED ORDER — ONDANSETRON 4 MG PO TBDP
4.0000 mg | ORAL_TABLET | Freq: Once | ORAL | Status: AC | PRN
  Administered 2016-01-05: 4 mg via ORAL
  Filled 2016-01-05: qty 1

## 2016-01-05 NOTE — ED Triage Notes (Signed)
Pt c/o abd cramping/pain and nausea he believes is r/t starting Keppra yesterday evening. Pt denies diarrhea and constipation. Pt A+OX4, speaking in complete sentences, ambulatory to triage.

## 2016-01-05 NOTE — ED Provider Notes (Signed)
[facility] DEPT Provider Note   CSN: [id number] Arrival date & time: 01/05/16  1750  First Provider Contact:  None       History   Chief Complaint Chief Complaint  Patient presents with  . Abdominal Pain  . Nausea    HPI Franklin [name] is a 35 y.o. male.  35 year old male presents with diffuse epigastric pain which began after he started taking Keppra last night. Has had nausea but no vomiting. No fever or chills. Pain does not radiate to his back. No anginal features to it. Denies any urinary symptoms. Symptoms persistent and nothing makes them better. No treatment used for this prior to arrival   The history is provided by the patient.  Abdominal Pain      Past Medical History:  Diagnosis Date  . Bipolar 1 disorder ([place])   . Chronic headaches   . 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 Depression   . Hx of electroencephalogram 12/2014   normal  . Mild cognitive impairment   . Schizophrenic disorder ([place])   . Seizures [facility])     Patient Active Problem List   Diagnosis Date Noted  . Abnormal EKG 11/30/2015  . Elevated troponin 11/30/2015  . Bipolar affective disorder, most recent episode unspecified type, remission status unspecified 03/11/2015  . Bipolar affective disorder ([place]) 01/08/2015  . Generalized idiopathic epilepsy and epileptic syndromes, without status epilepticus, not intractable ([place]) 11/27/2014  . Seizure disorder ([place]) 10/14/2014    History reviewed. No pertinent surgical history.     Home Medications    Prior to Admission medications   Medication Sig Start Date End Date Taking? Authorizing Provider  albuterol (PROVENTIL HFA;VENTOLIN HFA) 108 (90 Base) MCG/ACT inhaler Inhale 1-2 puffs into the lungs every 6 (six) hours as needed for wheezing or shortness of breath. 09/25/15  Yes Melynda Ripple, MD  ibuprofen (ADVIL,MOTRIN) 800 MG tablet Take 1 tablet (800 mg  total) by mouth 3 (three) times daily. 12/12/15  Yes Tammy Triplett, PA-C  lamoTRIgine (LAMICTAL) 25 MG tablet Take 50 mg by mouth every evening.   Yes Historical Provider, MD  levETIRAcetam (KEPPRA) 1000 MG tablet Take 1 tablet (1,000 mg total) by mouth 2 (two) times daily. 12/16/15  Yes Cameron Sprang, MD  QUEtiapine Fumarate (SEROQUEL XR) 150 MG 24 hr tablet Take 150 mg by mouth at bedtime.   Yes Historical Provider, MD  sertraline (ZOLOFT) 25 MG tablet Take 25 mg by mouth daily. Take along with 50 mg tablet   Yes Historical Provider, MD  sertraline (ZOLOFT) 50 MG tablet Take 50 mg by mouth daily. Take along with 25 mg tablet   Yes Historical Provider, MD  acetaminophen (TYLENOL) 500 MG tablet Take 1,000 mg by mouth every 6 (six) hours as needed for mild pain or headache.     Historical Provider, MD  fluticasone (FLONASE) 50 MCG/ACT nasal spray Place 2 sprays into both nostrils daily as needed for allergies.  10/01/15   Historical Provider, MD    Family History Family History  Problem Relation Age of Onset  . Heart disease Mother     Social History Social History  Substance Use Topics  . Smoking status: Never Smoker  . Smokeless tobacco: Never Used  . Alcohol use No     Allergies   Coconut flavor; Codeine; Penicillins; Depakote [divalproex sodium]; and Tape   Review of Systems Review of Systems  Gastrointestinal: Positive for abdominal  pain.  All other systems reviewed and are negative.    Physical Exam Updated Vital Signs BP 131/82 (BP Location: Right Arm)   Pulse (!) 55   Temp 98.1 F (36.7 C) (Oral)   Resp 18   Ht 6' (1.829 m)   Wt 70.3 kg   SpO2 100%   BMI 21.02 kg/m   Physical Exam  Constitutional: He is oriented to person, place, and time. He appears well-developed and well-nourished.  Non-toxic appearance. No distress.  HENT:  Head: Normocephalic and atraumatic.  Eyes: Conjunctivae, EOM and lids are normal. Pupils are equal, round, and reactive to light.    Neck: Normal range of motion. Neck supple. No tracheal deviation present. No thyroid mass present.  Cardiovascular: Normal rate, regular rhythm and normal heart sounds.  Exam reveals no gallop.   No murmur heard. Pulmonary/Chest: Effort normal and breath sounds normal. No stridor. No respiratory distress. He has no decreased breath sounds. He has no wheezes. He has no rhonchi. He has no rales.  Abdominal: Soft. Normal appearance and bowel sounds are normal. He exhibits no distension. There is no tenderness. There is no rebound and no CVA tenderness.  Musculoskeletal: Normal range of motion. He exhibits no edema or tenderness.  Neurological: He is alert and oriented to person, place, and time. He has normal strength. No cranial nerve deficit or sensory deficit. GCS eye subscore is 4. GCS verbal subscore is 5. GCS motor subscore is 6.  Skin: Skin is warm and dry. No abrasion and no rash noted.  Psychiatric: His affect is blunt. His speech is delayed. He is withdrawn.  Nursing note and vitals reviewed.    ED Treatments / Results  Labs (all labs ordered are listed, but only abnormal results are displayed) Labs Reviewed  COMPREHENSIVE METABOLIC PANEL - Abnormal; Notable for the following:       Result Value   ALT 15 (*)    Total Bilirubin 1.4 (*)    All other components within normal limits  LIPASE, BLOOD  CBC  URINALYSIS, ROUTINE W REFLEX MICROSCOPIC (NOT AT [facility])    EKG  EKG Interpretation None       Radiology No results found.  Procedures Procedures (including critical care time)  Medications Ordered in ED Medications  gi cocktail (Maalox,Lidocaine,Donnatal) (not administered)  famotidine (PEPCID) tablet 40 mg (not administered)  sucralfate (CARAFATE) tablet 1 g (not administered)  ondansetron (ZOFRAN-ODT) disintegrating tablet 4 mg (4 mg Oral Given 01/05/16 1845)     Initial Impression / Assessment and Plan / ED Course  I have reviewed the triage vital signs and the  nursing notes.  Pertinent labs & imaging results that were available during my care of the patient were reviewed by me and considered in my medical decision making (see chart for details).  Clinical Course    Patient medicated for likely gastritis from his medications. Heart rate noted. Is asymptomatic at this time. Blood pressure stable.  Final Clinical Impressions(s) / ED Diagnoses   Final diagnoses:  None    New Prescriptions New Prescriptions   No medications on file     Lacretia Leigh, MD 01/05/16 2156

## 2016-01-08 ENCOUNTER — Telehealth: Payer: Self-pay

## 2016-01-08 NOTE — Telephone Encounter (Signed)
Patient called in after hours stating that they hard started new medicine (Keppra 1000mg  qd and Sertraline 50mg  q am, Lamictol 50mg  q d and sertraline 25 mg q hs) and was experiencing stomach cramping and nausea.  Was instructed to go to the ED

## 2016-03-16 DIAGNOSIS — R6889 Other general symptoms and signs: Secondary | ICD-10-CM | POA: Insufficient documentation

## 2016-03-18 ENCOUNTER — Telehealth: Payer: Self-pay | Admitting: Neurology

## 2016-03-18 NOTE — Telephone Encounter (Signed)
Pt called wanted a call back because of dizzy spells.  The call was disconnected before I could confirm a call back number.

## 2016-03-19 NOTE — Telephone Encounter (Signed)
Pt has been scheduled for f/u next week

## 2016-03-19 NOTE — Telephone Encounter (Signed)
Tried calling patient on number we had listed and no voicemail was set up.   Patient called [place] at 5:06pm on 10/19.  Initial Comment: Caller stated the medicine he is taking is making him dizzy and cold sweats. Patient takes Keppra 1000mg  PO BID. Had been on Depakote prior to this. Having dizziness when gets up and almost falls.  Disposition: Go to ED now.  Disagree/ Comply: Comply Return Phone Number: [phone]  Dr. Erin Sons than called [facility] at 1:11 AM on 10/20. Initial Comment: Caller states he is Dr. Erin Sons at [facility]. On call provider paged.

## 2016-03-26 ENCOUNTER — Encounter: Payer: Self-pay | Admitting: Neurology

## 2016-03-26 ENCOUNTER — Encounter (HOSPITAL_COMMUNITY): Payer: Self-pay | Admitting: Emergency Medicine

## 2016-03-26 ENCOUNTER — Ambulatory Visit (INDEPENDENT_AMBULATORY_CARE_PROVIDER_SITE_OTHER): Payer: Medicaid Other | Admitting: Neurology

## 2016-03-26 ENCOUNTER — Emergency Department (HOSPITAL_COMMUNITY)
Admission: EM | Admit: 2016-03-26 | Discharge: 2016-03-26 | Disposition: A | Discharge status: Home / Self Care | Payer: Medicaid Other | Attending: Emergency Medicine | Admitting: Emergency Medicine

## 2016-03-26 VITALS — BP 130/80 | HR 80 | Ht 72.0 in | Wt 173.5 lb

## 2016-03-26 DIAGNOSIS — R339 Retention of urine, unspecified: Secondary | ICD-10-CM | POA: Insufficient documentation

## 2016-03-26 DIAGNOSIS — Z87448 Personal history of other diseases of urinary system: Secondary | ICD-10-CM | POA: Diagnosis not present

## 2016-03-26 DIAGNOSIS — F319 Bipolar disorder, unspecified: Secondary | ICD-10-CM | POA: Diagnosis not present

## 2016-03-26 DIAGNOSIS — F445 Conversion disorder with seizures or convulsions: Secondary | ICD-10-CM | POA: Diagnosis not present

## 2016-03-26 DIAGNOSIS — Z87898 Personal history of other specified conditions: Secondary | ICD-10-CM

## 2016-03-26 LAB — I-STAT CHEM 8, ED
BUN: 11 mg/dL (ref 6–20)
CHLORIDE: 102 mmol/L (ref 101–111)
Calcium, Ion: 1.12 mmol/L — ABNORMAL LOW (ref 1.15–1.40)
Creatinine, Ser: 1 mg/dL (ref 0.61–1.24)
GLUCOSE: 88 mg/dL (ref 65–99)
HEMATOCRIT: 46 % (ref 39.0–52.0)
HEMOGLOBIN: 15.6 g/dL (ref 13.0–17.0)
POTASSIUM: 4.3 mmol/L (ref 3.5–5.1)
SODIUM: 141 mmol/L (ref 135–145)
TCO2: 27 mmol/L (ref 0–100)

## 2016-03-26 LAB — URINALYSIS, ROUTINE W REFLEX MICROSCOPIC
Bilirubin Urine: NEGATIVE
Glucose, UA: NEGATIVE mg/dL
Hgb urine dipstick: NEGATIVE
Ketones, ur: NEGATIVE mg/dL
LEUKOCYTES UA: NEGATIVE
NITRITE: NEGATIVE
PROTEIN: NEGATIVE mg/dL
SPECIFIC GRAVITY, URINE: 1.016 (ref 1.005–1.030)
pH: 7.5 (ref 5.0–8.0)

## 2016-03-26 MED ORDER — OXCARBAZEPINE 150 MG PO TABS: 150.0000 mg | ORAL_TABLET | Freq: Two times a day (BID) | ORAL | 6 refills | Status: DC

## 2016-03-26 NOTE — ED Provider Notes (Signed)
[facility] DEPT Provider Note   CSN: [id number] Arrival date & time: 03/26/16  0708     History   Chief Complaint Chief Complaint  Patient presents with  . Urinary Retention    HPI Franklin [name] is a 35 y.o. male.  35 yo M with a chief complaint of urinary retention. Patient states that he has not urinated since yesterday. He has been eating and drinking normally. Denies any dysuria denies penile discharge. Denies prior issues with his kidneys. Denies fevers or chills. Denies abdominal tenderness denies vomiting.   The history is provided by the patient and a relative.  Illness  This is a new problem. The current episode started yesterday. The problem occurs constantly. The problem has not changed since onset.Pertinent negatives include no chest pain, no abdominal pain, no headaches and no shortness of breath. Nothing aggravates the symptoms. Nothing relieves the symptoms. He has tried nothing for the symptoms. The treatment provided no relief.    Past Medical History:  Diagnosis Date  . Bipolar 1 disorder ([place])   . Chronic headaches   . 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 Depression   . Hx of electroencephalogram 12/2014   normal  . Mild cognitive impairment   . Schizophrenic disorder ([place])   . Seizures [facility])     Patient Active Problem List   Diagnosis Date Noted  . 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 Abnormal EKG 11/30/2015  . Elevated troponin 11/30/2015  . Bipolar affective disorder, most recent episode unspecified type, remission status unspecified 03/11/2015  . Bipolar affective disorder ([place]) 01/08/2015  . Generalized idiopathic epilepsy and epileptic syndromes, without status epilepticus, not intractable ([place]) 11/27/2014  . Seizure disorder ([place]) 10/14/2014    History reviewed. No pertinent surgical history.     Home Medications    Prior  to Admission medications   Medication Sig Start Date End Date Taking? Authorizing Provider  acetaminophen (TYLENOL) 500 MG tablet Take 1,000 mg by mouth every 6 (six) hours as needed for mild pain or headache.     Historical Provider, MD  albuterol (PROVENTIL HFA;VENTOLIN HFA) 108 (90 Base) MCG/ACT inhaler Inhale 1-2 puffs into the lungs every 6 (six) hours as needed for wheezing or shortness of breath. Patient not taking: Reported on 03/26/2016 09/25/15   Melynda Ripple, MD  famotidine (PEPCID) 20 MG tablet Take 1 tablet (20 mg total) by mouth 2 (two) times daily. Patient not taking: Reported on 03/26/2016 01/05/16   Lacretia Leigh, MD  fluticasone [facility]) 50 MCG/ACT nasal spray Place 2 sprays into both nostrils daily as needed for allergies.  10/01/15   Historical Provider, MD  ibuprofen (ADVIL,MOTRIN) 800 MG tablet Take 1 tablet (800 mg total) by mouth 3 (three) times daily. Patient not taking: Reported on 03/26/2016 12/12/15   Tammy Triplett, PA-C  OXcarbazepine (TRILEPTAL) 150 MG tablet Take 1 tablet (150 mg total) by mouth 2 (two) times daily. 03/26/16   Cameron Sprang, MD  QUEtiapine Fumarate (SEROQUEL XR) 150 MG 24 hr tablet Take 150 mg by mouth at bedtime.    Historical Provider, MD  sertraline (ZOLOFT) 25 MG tablet Take 25 mg by mouth daily. Take along with 50 mg tablet    Historical Provider, MD  sertraline (ZOLOFT) 50 MG tablet Take 50 mg by mouth daily. Take along with 25 mg tablet    Historical Provider, MD  sucralfate (CARAFATE) 1 g tablet Take 1  tablet (1 g total) by mouth 4 (four) times daily. Patient not taking: Reported on 03/26/2016 01/05/16   Lacretia Leigh, MD    Family History Family History  Problem Relation Age of Onset  . Heart disease Mother     Social History Social History  Substance Use Topics  . Smoking status: Never Smoker  . Smokeless tobacco: Never Used  . Alcohol use No     Allergies   Coconut flavor; Codeine; Penicillins; Depakote [divalproex sodium];  and Tape   Review of Systems Review of Systems  Constitutional: Negative for chills and fever.  HENT: Negative for congestion and facial swelling.   Eyes: Negative for discharge and visual disturbance.  Respiratory: Negative for shortness of breath.   Cardiovascular: Negative for chest pain and palpitations.  Gastrointestinal: Negative for abdominal pain, diarrhea and vomiting.  Genitourinary: Positive for decreased urine volume and difficulty urinating. Negative for discharge, dysuria, penile pain and penile swelling.  Musculoskeletal: Negative for arthralgias and myalgias.  Skin: Negative for color change and rash.  Neurological: Negative for tremors, syncope and headaches.  Psychiatric/Behavioral: Negative for confusion and dysphoric mood.     Physical Exam Updated Vital Signs BP 125/66 (BP Location: Right Arm)   Pulse 65   Temp 98 F (36.7 C) (Oral)   Resp 14   Ht 6' (1.829 m)   Wt 155 lb (70.3 kg)   SpO2 98%   BMI 21.02 kg/m   Physical Exam  Constitutional: He is oriented to person, place, and time. He appears well-developed and well-nourished.  HENT:  Head: Normocephalic and atraumatic.  Eyes: EOM are normal. Pupils are equal, round, and reactive to light.  Neck: Normal range of motion. Neck supple. No JVD present.  Cardiovascular: Normal rate and regular rhythm.  Exam reveals no gallop and no friction rub.   No murmur heard. Pulmonary/Chest: No respiratory distress. He has no wheezes.  Abdominal: He exhibits no distension and no mass. There is no tenderness. There is no rebound and no guarding.  Musculoskeletal: Normal range of motion.  Neurological: He is alert and oriented to person, place, and time.  Skin: No rash noted. No pallor.  Psychiatric: He has a normal mood and affect. His behavior is normal.  Nursing note and vitals reviewed.    ED Treatments / Results  Labs (all labs ordered are listed, but only abnormal results are displayed) Labs Reviewed    I-STAT CHEM 8, ED - Abnormal; Notable for the following:       Result Value   Calcium, Ion 1.12 (*)    All other components within normal limits  URINALYSIS, ROUTINE W REFLEX MICROSCOPIC (NOT AT [facility])    EKG  EKG Interpretation None       Radiology No results found.  Procedures Procedures (including critical care time)  Medications Ordered in ED Medications - No data to display   Initial Impression / Assessment and Plan / ED Course  I have reviewed the triage vital signs and the nursing notes.  Pertinent labs & imaging results that were available during my care of the patient were reviewed by me and considered in my medical decision making (see chart for details).  Clinical Course    35 yo M With decreased urinary output. We'll bladder scan obtain a renal function.  UA is negative for infection. Renal function is at baseline. Discharge home.  2:53 PM:  I have discussed the diagnosis/risks/treatment options with the patient and family and believe the pt to be eligible  for discharge home to follow-up with PCP. We also discussed returning to the ED immediately if new or worsening sx occur. We discussed the sx which are most concerning (e.g., sudden worsening pain, fever, inability to tolerate by mouth) that necessitate immediate return. Medications administered to the patient during their visit and any new prescriptions provided to the patient are listed below.  Medications given during this visit Medications - No data to display   The patient appears reasonably screen and/or stabilized for discharge and I doubt any other medical condition or other [facility] requiring further screening, evaluation, or treatment in the ED at this time prior to discharge.    Final Clinical Impressions(s) / ED Diagnoses   Final diagnoses:  History of oliguria    New Prescriptions Discharge Medication List as of 03/26/2016  9:41 AM       Deno Etienne, DO 03/26/16 1453

## 2016-03-26 NOTE — ED Triage Notes (Signed)
Pt states that he has had urinary retention since yesterday afternoon. Even after drinking lots of water, still unable to go.  Pt denies pain with urination prior to retention.  He states he does feel pressure and urge to urinate but unable to go.  Denies fever, N/V/D.

## 2016-03-26 NOTE — ED Notes (Signed)
Gave Coke, graham crackers, and peanut butter to patient.

## 2016-03-26 NOTE — ED Notes (Signed)
Bladder scan done. Only found 48 ml in bladder.

## 2016-03-26 NOTE — Progress Notes (Signed)
NEUROLOGY FOLLOW UP OFFICE NOTE  Franklin [name] SK:[id number]  HISTORY OF PRESENT ILLNESS: I had the pleasure of seeing Franklin [name] in follow-up in the neurology clinic on 12/16/2015.  The patient was last seen 9 months ago for seizures. He is a poor historian with a history of medication non-compliance. Records and images were personally reviewed where available. His 1-hour sleep-deprived EEG was normal. He continued to report recurrent seizures and was referred to [facility] for inpatient video EEG monitoring. Records were reviewed, he was only there for a few hours, he became progressively agitated and uncooperative, but did have 3 typical spells that were confirmed by his cousin/PCA Franklin [name] as typical events. He was kicking his legs, unresponsive, staring off, flailing around the bed during one event. The second episode he was noted to have right hand shaking, trying to get out of bed, fidgeting around the bed. The third push button was for fighting the nurses and getting agitated, pulling his IV out. All three episodes did not show any EEG correlate. It was noted that it is unclear whether all his spells are non-epileptic, however baseline uncontrolled psychosis made him unable to comply with [name] monitoring to continue testing. He was seen stat by Psychiatry and felt that outpatient psychiatry was appropriate. He has not been back to [place] yet. He reports stomach upset with Keppra. He had previously been on Depakote and Lamictal, but stopped them due to side effects. He takes Seroquel and Zoloft prescribed by [employer]. He reports the last seizure was in the hospital last 03/16/16. He reports an episode of loss of consciousness 2 weeks ago where he woke up on the floor. He lives with his cousin/PCA Franklin [name] who reminds him to take his medications. He reports being in the ER recently for urination problems, he has burning and hesitation but was told workup was normal and to follow-up with PCP. He  does not have a PCP.   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 He and his cousin both report that seizures would start with staring and unresponsiveness, followed by low amplitude shaking of both hands. Sometimes he would have violent leg kicking. They state that shaking last from 30 minutes up to "1-1/2 hours" followed by confusion for around 20 minutes. He would be sleepy after a seizure. He has had urinary incontinence with some of them, no tongue bite or other significant injuries. He reports all seizures occur during wakefulness, no nocturnal seizures that he is aware of, but woke up one time with urinary incontinence. He denies any olfactory/gustatory hallucinations, deja vu, rising epigastric sensation, focal numbness/tingling/weakness, myoclonic jerks. He occasionally drops things from his hands. He reports that Depakote was started at age 76, however he has only been taking 250mg /day, stating that when he was on 500 or 750mg , he would be "like a zombie, walking into doors." The low dose Depakote has not controlled the seizures, he reports seizures occurring every 3-4 days, longest seizure-free interval probably 1-2 months.   On review of records on [facility], he has been to the ER several times since 2008 with no note of a seizure history until 2014. There are some records of patient being on Depakote, Tegretol, and Haldol, presumably for Bipolar disorder. He reports chronic headaches occurring 2-3 times a week with sharp pain in the back of his head, "like a needle sticking in it" lasting 30-45 minutes. He would  feel lightheaded. No associated nausea, vomiting, photo/phonophobia, or visual obscurations. He takes Tylenol or aspirin with good effect. He lives with his girlfriend. He finished 12th grade in special education classes. He does not drive.  Epilepsy Risk Factors: His maternal uncle used to have seizures. He was  born premature, with mild cognitive impairment. Otherwise he denies any history of febrile convulsions, CNS infections such as meningitis/encephalitis, significant traumatic brain injury, neurosurgical procedures.  PAST MEDICAL HISTORY: Past Medical History:  Diagnosis Date  . Bipolar 1 disorder ([place])   . Chronic headaches   . 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 Depression   . Hx of electroencephalogram 12/2014   normal  . Mild cognitive impairment   . Schizophrenic disorder ([place])   . Seizures [facility])     MEDICATIONS:  Outpatient Encounter Prescriptions as of 03/26/2016  Medication Sig Note  . QUEtiapine Fumarate (SEROQUEL XR) 150 MG 24 hr tablet Take 150 mg by mouth at bedtime.   . levETIRAcetam (KEPPRA) 1000 MG tablet Take 1 tablet (1,000 mg total) by mouth 2 (two) times daily.   Marland Kitchen acetaminophen (TYLENOL) 500 MG tablet Take 1,000 mg by mouth every 6 (six) hours as needed for mild pain or headache.    . albuterol (PROVENTIL HFA;VENTOLIN HFA) 108 (90 Base) MCG/ACT inhaler Inhale 1-2 puffs into the lungs every 6 (six) hours as needed for wheezing or shortness of breath. (Patient not taking: Reported on 03/26/2016) 01/05/2016: Currently taking.   . famotidine (PEPCID) 20 MG tablet Take 1 tablet (20 mg total) by mouth 2 (two) times daily. (Patient not taking: Reported on 03/26/2016)   . fluticasone (FLONASE) 50 MCG/ACT nasal spray Place 2 sprays into both nostrils daily as needed for allergies.    Marland Kitchen ibuprofen (ADVIL,MOTRIN) 800 MG tablet Take 1 tablet (800 mg total) by mouth 3 (three) times daily. (Patient not taking: Reported on 03/26/2016)   .     . sertraline (ZOLOFT) 25 MG tablet Take 25 mg by mouth daily. Take along with 50 mg tablet   . sertraline (ZOLOFT) 50 MG tablet Take 50 mg by mouth daily. Take along with 25 mg tablet   . sucralfate (CARAFATE) 1 g tablet Take 1 tablet (1 g total) by mouth 4 (four)  times daily. (Patient not taking: Reported on 03/26/2016)   . [DISCONTINUED] lamoTRIgine (LAMICTAL) 25 MG tablet Take 50 mg by mouth every evening.    No facility-administered encounter medications on file as of 03/26/2016.     ALLERGIES: Allergies  Allergen Reactions  . Coconut Flavor Nausea And Vomiting  . Codeine Nausea And Vomiting  . Penicillins Nausea And Vomiting    Has patient had a PCN reaction causing immediate rash, facial/tongue/throat swelling, SOB or lightheadedness with hypotension: No Has patient had a PCN reaction causing severe rash involving mucus membranes or skin necrosis: No Has patient had a PCN reaction that required hospitalization No Has patient had a PCN reaction occurring within the last 10 years: No If all of the above answers are "NO", then may proceed with Cephalosporin use.    . Depakote [Divalproex Sodium] Other (See Comments)    Pt states that this medication makes him feel like he is out of it.   . Tape Other (See Comments)    Reaction:  Tears skin  Pt is able to use paper tape.      FAMILY HISTORY: Family History  Problem Relation Age of  Onset  . Heart disease Mother     SOCIAL HISTORY: Social History   Social History  . Marital status: Single    Spouse name: N/A  . Number of children: N/A  . Years of education: N/A   Occupational History  . Not on file.   Social History Main Topics  . Smoking status: Never Smoker  . Smokeless tobacco: Never Used  . Alcohol use No  . Drug use: No  . Sexual activity: Not Currently   Other Topics Concern  . Not on file   Social History Narrative  . No narrative on file    REVIEW OF SYSTEMS: Constitutional: No fevers, chills, or sweats, no generalized fatigue, change in appetite Eyes: No visual changes, double vision, eye pain Ear, nose and throat: No hearing loss, ear pain, nasal congestion, sore throat Cardiovascular: No chest pain, palpitations Respiratory:  No shortness of breath at  rest or with exertion, wheezes GastrointestinaI: No nausea, vomiting, diarrhea, abdominal pain, fecal incontinence Genitourinary:  + dysuria, urinary retention Musculoskeletal:  No neck pain, back pain Integumentary: No rash, pruritus, skin lesions Neurological: as above Psychiatric: No depression, insomnia, anxiety Endocrine: No palpitations, fatigue, diaphoresis, mood swings, change in appetite, change in weight, increased thirst Hematologic/Lymphatic:  No anemia, purpura, petechiae. Allergic/Immunologic: no itchy/runny eyes, nasal congestion, recent allergic reactions, rashes  PHYSICAL EXAM: Vitals:   03/26/16 1336  BP: 130/80  Pulse: 80   General: No acute distress, pleasant in the office today Head:  Normocephalic/atraumatic Neck: supple, no paraspinal tenderness, full range of motion Heart:  Regular rate and rhythm Lungs:  Clear to auscultation bilaterally Back: No paraspinal tenderness Skin/Extremities: No rash, no edema Neurological Exam: alert and oriented to person, place, and time. No aphasia or dysarthria. Fund of knowledge is appropriate.  Recent and remote memory are intact. 3/3 delayed recall.  Attention and concentration are normal.    Able to name objects and repeat phrases. Cranial nerves: Pupils equal, round, reactive to light.  Extraocular movements intact with no nystagmus. Visual fields full. Facial sensation intact. No facial asymmetry. Tongue, uvula, palate midline.  Motor: Bulk and tone normal, muscle strength 5/5 throughout with no pronator drift.  Sensation to light touch intact.  No extinction to double simultaneous stimulation.  Deep tendon reflexes 2+ throughout, toes downgoing.  Finger to nose testing intact.  Gait wide-based, favoring right leg, no ataxia. Romberg negative.  IMPRESSION: This is a 35 yo RH man with a history of bipolar disorder, mild cognitive impairment, with a report of history of seizures since age 37 or 89. Review of records from ER  visits in the past have no indication of seizures or seizure medication until 2014. He states he has never seen a neurologist in the past. Seizures consist of staring followed by shaking, he has had urinary incontinence with some of them, however the prolonged duration of some (lasting 1-1/2 hours) also raises the possibility of non-epileptic events. He was admitted at [facility] for vEEG monitoring but only stayed for a few hours due to increasing agitation. 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 It is most likely that his seizures are all non-epileptic, however a complete [name] stay could not be done due to aggressive behavior. Due to his cognitive impairment, I discussed with him that the seizures are due to his bipolar disorder and stress, and treatment of bipolar disorder may help with the seizures. He will stop Keppra. He was given a prescription for Trileptal  150mg  BID for mood stabilization, which he will discuss with [facility] on his follow-up. He does not drive. He reports urinary hesitation and dysuria and was given information for PCP follow-up. He will follow-up in 4 months and knows to call our office for any changes.   Thank you for allowing me to participate in his care.  Please do not hesitate to call for any questions or concerns.  The duration of this appointment visit was 25 minutes of face-to-face time with the patient.  Greater than 50% of this time was spent in counseling, explanation of diagnosis, planning of further management, and coordination of care.   Ellouise Newer, M.D.

## 2016-03-26 NOTE — Discharge Instructions (Signed)
Eat and drink as best as you can. Follow up with your PCP.

## 2016-03-26 NOTE — Patient Instructions (Addendum)
1. Your seizures are coming from your Bipolar disorder, so it is important that this is treated better. Call [facility] for a follow-up visit. 2. Stop the Keppra 3. Start Trileptal 150mg : Take 1 tablet twice a day 4. Follow-up with a family doctor for your bladder problems 5. Follow-up in 4 months  [facility] [phone]  [place]  [phone]

## 2016-03-26 NOTE — ED Notes (Signed)
In and out cath not needed.

## 2016-04-19 ENCOUNTER — Ambulatory Visit: Payer: Self-pay | Admitting: Neurology

## 2016-05-03 ENCOUNTER — Encounter: Payer: Self-pay | Admitting: Family Medicine

## 2016-05-03 ENCOUNTER — Ambulatory Visit: Payer: Medicaid Other | Attending: Family Medicine | Admitting: Family Medicine

## 2016-05-03 VITALS — BP 126/73 | HR 81 | Temp 98.5°F | Ht 72.0 in | Wt 164.2 lb

## 2016-05-03 DIAGNOSIS — G40909 Epilepsy, unspecified, not intractable, without status epilepticus: Secondary | ICD-10-CM

## 2016-05-03 DIAGNOSIS — F319 Bipolar disorder, unspecified: Secondary | ICD-10-CM | POA: Insufficient documentation

## 2016-05-03 DIAGNOSIS — F3175 Bipolar disorder, in partial remission, most recent episode depressed: Secondary | ICD-10-CM | POA: Diagnosis not present

## 2016-05-03 DIAGNOSIS — Z79899 Other long term (current) drug therapy: Secondary | ICD-10-CM | POA: Insufficient documentation

## 2016-05-03 DIAGNOSIS — Z888 Allergy status to other drugs, medicaments and biological substances status: Secondary | ICD-10-CM | POA: Insufficient documentation

## 2016-05-03 DIAGNOSIS — Z880 Allergy status to penicillin: Secondary | ICD-10-CM | POA: Insufficient documentation

## 2016-05-03 DIAGNOSIS — Z131 Encounter for screening for diabetes mellitus: Secondary | ICD-10-CM | POA: Insufficient documentation

## 2016-05-03 LAB — GLUCOSE, POCT (MANUAL RESULT ENTRY): POC GLUCOSE: 98 mg/dL (ref 70–99)

## 2016-05-03 LAB — POCT GLYCOSYLATED HEMOGLOBIN (HGB A1C): Hemoglobin A1C: 5.1

## 2016-05-03 NOTE — Patient Instructions (Signed)
Epilepsy °Epilepsy is a condition in which a person has repeated seizures over time. A seizure is a sudden burst of abnormal electrical and chemical activity in the brain. Seizures can cause a change in attention, behavior, or the ability to remain awake and alert (altered mental status). °Epilepsy increases a person's risk of falls, accidents, and injury. It can also lead to complications, including: °· Depression. °· Poor memory. °· Sudden unexplained death in epilepsy (SUDEP). This complication is rare, and its cause is not known. °Most people with epilepsy lead normal lives. °What are the causes? °This condition may be caused by: °· A head injury. °· An injury that happens at birth. °· A high fever during childhood. °· A stroke. °· Bleeding that goes into or around the brain. °· Certain medicines and drugs. °· Having too little oxygen for a long period of time. °· Abnormal brain development. °· Certain infections, such as meningitis and encephalitis. °· Brain tumors. °· Conditions that are passed along from parent to child (are hereditary). °What are the signs or symptoms? °Symptoms of a seizure vary greatly from person to person. They include: °· Convulsions. °· Stiffening of the body. °· Involuntary movements of the arms or legs. °· Loss of consciousness. °· Breathing problems. °· Falling suddenly. °· Confusion. °· Head nodding. °· Eye blinking or fluttering. °· Lip smacking. °· Drooling. °· Rapid eye movements. °· Grunting. °· Loss of bladder control and bowel control. °· Staring. °· Unresponsiveness. °Some people have symptoms right before a seizure happens (aura) and right after a seizure happens. Symptoms of an aura include: °· Fear or anxiety. °· Nausea. °· Feeling like the room is spinning (vertigo). °· A feeling of having seen or heard something before (deja vu). °· Odd tastes or smells. °· Changes in vision, such as seeing flashing lights or spots. °Symptoms that follow a seizure  include: °· Confusion. °· Sleepiness. °· Headache. °How is this diagnosed? °This condition is diagnosed based on: °· Your symptoms. °· Your medical history. °· A physical exam. °· A neurological exam. A neurological exam is similar to a physical exam. It involves checking your strength, reflexes, coordination, and sensations. °· Tests, such as: °¨ An electroencephalogram (EEG). This is a painless test that creates a diagram of your brain waves. °¨ An MRI of the brain. °¨ A CT scan of the brain. °¨ A lumbar puncture, also called a spinal tap. °¨ Blood tests to check for signs of infection or abnormal blood chemistry. °How is this treated? °There is no cure for this condition, but treatment can help control seizures. Treatment may involve: °· Taking medicines to control seizures. These include medicines to prevent seizures and medicines to stop seizures as they occur. °· Having a device called a vagus nerve stimulator implanted in the chest. The device sends electrical impulses to the vagus nerve and to the brain to prevent seizures. This treatment may be recommended if medicines do not help. °· Brain surgery. There are several kinds of surgeries that may be done to stop seizures from happening or to reduce how often seizures happen. °· Having regular blood tests. You may need to have blood tests regularly to check that you are getting the right amount of medicine. °Once this condition has been diagnosed, it is important to begin treatment as soon as possible. For some people, epilepsy eventually goes away. °Follow these instructions at home: °Medicines  ° °· Take over-the-counter and prescription medicines only as told by your health care provider. °·   Avoid any substances that may prevent your medicine from working properly, such as alcohol. °Activity  °· Get enough rest. Lack of sleep can make seizures more likely to occur. °· Follow instructions from your health care provider about driving, swimming, and doing any  other activities that would be dangerous if you had a seizure. °Educating others  °Teach friends and family what to do if you have a seizure. They should: °· Lay you on the ground to prevent a fall. °· Cushion your head and body. °· Loosen any tight clothing around your neck. °· Turn you on your side. If vomiting occurs, this helps keep your airway clear. °· Stay with you until you recover. °· Not hold you down. Holding you down will not stop the seizure. °· Not put anything in your mouth. °· Know whether or not you need emergency care. °General instructions  °· Avoid anything that has ever triggered a seizure for you. °· Keep a seizure diary. Record what you remember about each seizure, especially anything that might have triggered the seizure. °· Keep all follow-up visits as told by your health care provider. This is important. °Contact a health care provider if: °· Your seizure pattern changes. °· You have symptoms of infection or another illness. This might increase your risk of having a seizure. °Get help right away if: °· You have a seizure that does not stop after 5 minutes. °· You have several seizures in a row without a complete recovery in between seizures. °· You have a seizure that makes it harder to breathe. °· You have a seizure that is different from previous seizures. °· You have a seizure that leaves you unable to speak or use a part of your body. °· You did not wake up immediately after a seizure. °This information is not intended to replace advice given to you by your health care provider. Make sure you discuss any questions you have with your health care provider. °Document Released: 05/17/2005 Document Revised: 12/13/2015 Document Reviewed: 11/25/2015 °[employer] Interactive Patient Education © 2017 [employer] ° °

## 2016-05-03 NOTE — Progress Notes (Signed)
Subjective:  Patient ID: Franklin [name], male    DOB: 09-22-1980  Age: 35 y.o. MRN: [id number]  CC: Establish Care; Diabetes; Seizures; and Manic Behavior   HPI Franklin [name] is a 35 year old male with a history of seizures, bipolar disorder, mild cognitive impairment who comes into the clinic to establish care with me as he was previously followed by the nurse practitioner who is no longer with the clinic.  He saw his neurologist - Dr Delice Lesch [facility], 6 weeks ago and seizures were thought to be secondary to bipolar disorder and stress and his Keppra was discontinued and he was commenced on Trileptal. His last seizure was 2 weeks ago; he currently does not drive. Bipolar disorder is managed by [facility].  He is up-to-date on flu shot which he received at [facility]; does not have any complaints today.  Past Medical History:  Diagnosis Date  . Bipolar 1 disorder ([place])   . Chronic headaches   . 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 Depression   . Hx of electroencephalogram 12/2014   normal  . Mild cognitive impairment   . Schizophrenic disorder ([place])   . Seizures ([place])     History reviewed. No pertinent surgical history.  Allergies  Allergen Reactions  . Coconut Flavor Nausea And Vomiting  . Codeine Nausea And Vomiting  . Penicillins Nausea And Vomiting    Has patient had a PCN reaction causing immediate rash, facial/tongue/throat swelling, SOB or lightheadedness with hypotension: No Has patient had a PCN reaction causing severe rash involving mucus membranes or skin necrosis: No Has patient had a PCN reaction that required hospitalization No Has patient had a PCN reaction occurring within the last 10 years: No If all of the above answers are "NO", then may proceed with Cephalosporin use.    . Depakote [Divalproex Sodium] Other (See Comments)    Pt states that this medication makes him  feel like he is out of it.   . Tape Other (See Comments)    Reaction:  Tears skin  Pt is able to use paper tape.       Outpatient Medications Prior to Visit  Medication Sig Dispense Refill  . QUEtiapine Fumarate (SEROQUEL XR) 150 MG 24 hr tablet Take 150 mg by mouth at bedtime.    Marland Kitchen acetaminophen (TYLENOL) 500 MG tablet Take 1,000 mg by mouth every 6 (six) hours as needed for mild pain or headache.     . albuterol (PROVENTIL HFA;VENTOLIN HFA) 108 (90 Base) MCG/ACT inhaler Inhale 1-2 puffs into the lungs every 6 (six) hours as needed for wheezing or shortness of breath. (Patient not taking: Reported on 05/03/2016) 1 Inhaler 0  . famotidine (PEPCID) 20 MG tablet Take 1 tablet (20 mg total) by mouth 2 (two) times daily. (Patient not taking: Reported on 05/03/2016) 30 tablet 0  . fluticasone (FLONASE) 50 MCG/ACT nasal spray Place 2 sprays into both nostrils daily as needed for allergies.   0  . OXcarbazepine (TRILEPTAL) 150 MG tablet Take 1 tablet (150 mg total) by mouth 2 (two) times daily. (Patient not taking: Reported on 05/03/2016) 60 tablet 6  . ibuprofen (ADVIL,MOTRIN) 800 MG tablet Take 1 tablet (800 mg total) by mouth 3 (three) times daily. (Patient not taking: Reported on 05/03/2016) 21 tablet 0  . sertraline (ZOLOFT) 25 MG tablet Take 25 mg by mouth daily. Take along with 50 mg tablet    .  sertraline (ZOLOFT) 50 MG tablet Take 50 mg by mouth daily. Take along with 25 mg tablet    . sucralfate (CARAFATE) 1 g tablet Take 1 tablet (1 g total) by mouth 4 (four) times daily. (Patient not taking: Reported on 05/03/2016) 30 tablet 0   No facility-administered medications prior to visit.     ROS Review of Systems  Constitutional: Negative for activity change and appetite change.  HENT: Negative for sinus pressure and sore throat.   Eyes: Negative for visual disturbance.  Respiratory: Negative for cough, chest tightness and shortness of breath.   Cardiovascular: Negative for chest pain and leg  swelling.  Gastrointestinal: Negative for abdominal distention, abdominal pain, constipation and diarrhea.  Endocrine: Negative.   Genitourinary: Negative for dysuria.  Musculoskeletal: Negative for joint swelling and myalgias.  Skin: Negative for rash.  Allergic/Immunologic: Negative.   Neurological: Negative for weakness, light-headedness and numbness.  Psychiatric/Behavioral: Negative for dysphoric mood and suicidal ideas.    Objective:  BP 126/73 (BP Location: Right Arm, Patient Position: Sitting, Cuff Size: Small)   Pulse 81   Temp 98.5 F (36.9 C) (Oral)   Ht 6' (1.829 m)   Wt 164 lb 3.2 oz (74.5 kg)   SpO2 99%   BMI 22.27 kg/m   BP/Weight 05/03/2016 03/26/2016 [id number]  Systolic BP [id number]  Diastolic BP 73 66 80  Wt. (Lbs) 164.2 155 173.5  BMI 22.27 21.02 23.53      Physical Exam  Constitutional: He is oriented to person, place, and time. He appears well-developed and well-nourished.  Cardiovascular: Normal rate, normal heart sounds and intact distal pulses.   No murmur heard. Pulmonary/Chest: Effort normal and breath sounds normal. He has no wheezes. He has no rales. He exhibits no tenderness.  Abdominal: Soft. Bowel sounds are normal. He exhibits no distension and no mass. There is no tenderness.  Musculoskeletal: Normal range of motion.  Neurological: He is alert and oriented to person, place, and time.  Skin: Skin is warm and dry.  Psychiatric: He has a normal mood and affect.     Lab Results  Component Value Date   HGBA1C 5.1 05/03/2016    CMP Latest Ref Rng & Units 03/26/2016 01/05/2016 12/14/2015  Glucose 65 - 99 mg/dL 88 98 99  BUN 6 - 20 mg/dL 11 12 14   Creatinine 0.61 - 1.24 mg/dL 1.00 0.95 0.90  Sodium 135 - 145 mmol/L 141 138 142  Potassium 3.5 - 5.1 mmol/L 4.3 4.2 3.5  Chloride 101 - 111 mmol/L 102 103 104  CO2 22 - 32 mmol/L - 30 -  Calcium 8.9 - 10.3 mg/dL - 9.6 -  Total Protein 6.5 - 8.1 g/dL - 8.1 -  Total Bilirubin 0.3 - 1.2 mg/dL  - 1.4(H) -  Alkaline Phos 38 - 126 U/L - 66 -  AST 15 - 41 U/L - 15 -  ALT 17 - 63 U/L - 15(L) -     Assessment & Plan:   1. Screening for diabetes mellitus A1c 5.1 Patient is not diabetic and he has been reassured We'll keep an eye on his A1c given he is on high risk medications - Glucose (CBG) - HgB A1c  2. Seizure disorder ([place]) Last seizure was 2 weeks ago He knows not to drive Continue Trileptal Keep appointment with [facility]  3. Bipolar disorder, in partial remission, most recent episode depressed ([place]) Currently managed by [facility] Continue Seroquel and Trileptal  4. High risk medication use - COMPLETE  METABOLIC PANEL WITH GFR; Future - Lipid panel; Future   No orders of the defined types were placed in this encounter.   Follow-up: Return in about 6 months (around 11/01/2016) for Coordination of care.   Arnoldo Morale MD

## 2016-05-06 ENCOUNTER — Other Ambulatory Visit: Payer: Self-pay

## 2016-05-10 ENCOUNTER — Other Ambulatory Visit: Payer: Self-pay

## 2016-05-12 ENCOUNTER — Emergency Department (HOSPITAL_COMMUNITY): Payer: Medicaid Other

## 2016-05-12 ENCOUNTER — Emergency Department (HOSPITAL_COMMUNITY)
Admission: EM | Admit: 2016-05-12 | Discharge: 2016-05-12 | Disposition: A | Discharge status: Home / Self Care | Payer: Medicaid Other | Attending: Emergency Medicine | Admitting: Emergency Medicine

## 2016-05-12 ENCOUNTER — Encounter (HOSPITAL_COMMUNITY): Payer: Self-pay | Admitting: Emergency Medicine

## 2016-05-12 DIAGNOSIS — R05 Cough: Secondary | ICD-10-CM

## 2016-05-12 DIAGNOSIS — R112 Nausea with vomiting, unspecified: Secondary | ICD-10-CM

## 2016-05-12 DIAGNOSIS — Z79899 Other long term (current) drug therapy: Secondary | ICD-10-CM | POA: Diagnosis not present

## 2016-05-12 DIAGNOSIS — R042 Hemoptysis: Secondary | ICD-10-CM | POA: Diagnosis not present

## 2016-05-12 DIAGNOSIS — R059 Cough, unspecified: Secondary | ICD-10-CM

## 2016-05-12 DIAGNOSIS — R197 Diarrhea, unspecified: Secondary | ICD-10-CM | POA: Insufficient documentation

## 2016-05-12 LAB — COMPREHENSIVE METABOLIC PANEL
ALBUMIN: 4.3 g/dL (ref 3.5–5.0)
ALK PHOS: 53 U/L (ref 38–126)
ALT: 14 U/L — AB (ref 17–63)
AST: 18 U/L (ref 15–41)
Anion gap: 7 (ref 5–15)
BILIRUBIN TOTAL: 1.5 mg/dL — AB (ref 0.3–1.2)
BUN: 9 mg/dL (ref 6–20)
CALCIUM: 9.5 mg/dL (ref 8.9–10.3)
CO2: 26 mmol/L (ref 22–32)
CREATININE: 0.94 mg/dL (ref 0.61–1.24)
Chloride: 106 mmol/L (ref 101–111)
GFR calc Af Amer: >60 mL/min (ref >60)
GFR calc non Af Amer: >60 mL/min (ref >60)
GLUCOSE: 91 mg/dL (ref 65–99)
Potassium: 4.3 mmol/L (ref 3.5–5.1)
SODIUM: 139 mmol/L (ref 135–145)
TOTAL PROTEIN: 6.9 g/dL (ref 6.5–8.1)

## 2016-05-12 LAB — CBC WITH DIFFERENTIAL/PLATELET
BASOS PCT: 0 %
Basophils Absolute: 0 10*3/uL (ref 0.0–0.1)
EOS ABS: 0.1 10*3/uL (ref 0.0–0.7)
EOS PCT: 1 %
HCT: 45.1 % (ref 39.0–52.0)
Hemoglobin: 15.8 g/dL (ref 13.0–17.0)
LYMPHS ABS: 1.5 10*3/uL (ref 0.7–4.0)
Lymphocytes Relative: 25 %
MCH: 30.7 pg (ref 26.0–34.0)
MCHC: 35 g/dL (ref 30.0–36.0)
MCV: 87.6 fL (ref 78.0–100.0)
MONO ABS: 0.5 10*3/uL (ref 0.1–1.0)
Monocytes Relative: 9 %
Neutro Abs: 4 10*3/uL (ref 1.7–7.7)
Neutrophils Relative %: 65 %
Platelets: 170 10*3/uL (ref 150–400)
RBC: 5.15 MIL/uL (ref 4.22–5.81)
RDW: 12.9 % (ref 11.5–15.5)
WBC: 6 10*3/uL (ref 4.0–10.5)

## 2016-05-12 LAB — LIPASE, BLOOD: Lipase: 19 U/L (ref 11–51)

## 2016-05-12 MED ORDER — ONDANSETRON HCL 4 MG/2ML IJ SOLN
4.0000 mg | Freq: Once | INTRAMUSCULAR | Status: AC
  Administered 2016-05-12: 4 mg via INTRAVENOUS
  Filled 2016-05-12: qty 2

## 2016-05-12 MED ORDER — SODIUM CHLORIDE 0.9 % IV BOLUS (SEPSIS)
1000.0000 mL | Freq: Once | INTRAVENOUS | Status: AC
  Administered 2016-05-12: 1000 mL via INTRAVENOUS

## 2016-05-12 MED ORDER — ONDANSETRON 4 MG PO TBDP: 4.0000 mg | ORAL_TABLET | Freq: Three times a day (TID) | ORAL | 0 refills | Status: DC | PRN

## 2016-05-12 NOTE — ED Notes (Signed)
ED Provider at bedside. 

## 2016-05-12 NOTE — ED Triage Notes (Signed)
Pt in from home via [facility] with c/o n/v/d, cough and "vomitting blood" since yesterday. Per EMS, pt states he had one dark emesis yesterday. CBG 83, 99%, 120/76, 74. A&ox4, denies sob

## 2016-05-12 NOTE — ED Notes (Signed)
Patient transported to X-ray 

## 2016-05-12 NOTE — ED Provider Notes (Signed)
[facility] DEPT Provider Note   CSN: [id number] Arrival date & time: 05/12/16  0808     History   Chief Complaint Chief Complaint  Patient presents with  . Emesis  . Cough  . Diarrhea    HPI Franklin [name] is a 35 y.o. male.  HPI Patient presents with nausea vomiting diarrhea. Began yesterday. States she's also had a cough and congestion. States she's been coughing up blood. States it was "a lot". Patient denies throwing up any blood or black emesis to me but reportedly told EMS she had dark emesis yesterday. Somewhat difficult to get history from. States he feels bad. Has upper abdominal pain. States his nose is been bleeding and also states his nose is not bleeding.   Past Medical History:  Diagnosis Date  . Bipolar 1 disorder ([place])   . Chronic headaches   . 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 Depression   . Hx of electroencephalogram 12/2014   normal  . Mild cognitive impairment   . Schizophrenic disorder ([place])   . Seizures [facility])     Patient Active Problem List   Diagnosis Date Noted  . 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 Abnormal EKG 11/30/2015  . Elevated troponin 11/30/2015  . Bipolar affective disorder, most recent episode unspecified type, remission status unspecified 03/11/2015  . Bipolar affective disorder ([place]) 01/08/2015  . 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 Seizure disorder ([place]) 10/14/2014    History reviewed. No pertinent surgical history.     Home Medications    Prior to Admission medications   Medication Sig Start Date End Date Taking? Authorizing Provider  acetaminophen (TYLENOL) 500 MG tablet Take 1,000 mg by mouth every 6 (six) hours as needed for mild pain or headache.     Historical Provider, MD  albuterol (PROVENTIL HFA;VENTOLIN HFA) 108  (90 Base) MCG/ACT inhaler Inhale 1-2 puffs into the lungs every 6 (six) hours as needed for wheezing or shortness of breath. Patient not taking: Reported on 05/03/2016 09/25/15   Melynda Ripple, MD  famotidine (PEPCID) 20 MG tablet Take 1 tablet (20 mg total) by mouth 2 (two) times daily. Patient not taking: Reported on 05/03/2016 01/05/16   Lacretia Leigh, MD  fluticasone [facility]) 50 MCG/ACT nasal spray Place 2 sprays into both nostrils daily as needed for allergies.  10/01/15   Historical Provider, MD  ondansetron (ZOFRAN-ODT) 4 MG disintegrating tablet Take 1 tablet (4 mg total) by mouth every 8 (eight) hours as needed for nausea or vomiting. 05/12/16   Davonna Belling, MD  OXcarbazepine (TRILEPTAL) 150 MG tablet Take 1 tablet (150 mg total) by mouth 2 (two) times daily. Patient not taking: Reported on 05/03/2016 03/26/16   Cameron Sprang, MD  QUEtiapine Fumarate (SEROQUEL XR) 150 MG 24 hr tablet Take 150 mg by mouth at bedtime.    Historical Provider, MD    Family History Family History  Problem Relation Age of Onset  . Heart disease Mother     Social History Social History  Substance Use Topics  . Smoking status: Never Smoker  . Smokeless tobacco: Never Used  . Alcohol use No     Allergies   Coconut flavor; Codeine; Penicillins; Depakote [divalproex sodium]; and Tape   Review of Systems Review of Systems  Constitutional: Positive for appetite change.  HENT: Positive for congestion. Negative for sore throat.  Respiratory: Positive for cough.   Cardiovascular: Positive for chest pain.  Gastrointestinal: Positive for abdominal pain, diarrhea, nausea and vomiting.  Genitourinary: Negative for dysuria.  Musculoskeletal: Negative for back pain.  Hematological: Negative for adenopathy.  Psychiatric/Behavioral: Negative for confusion.     Physical Exam Updated Vital Signs BP 124/96 (BP Location: Left Arm)   Pulse 61   Temp 98.7 F (37.1 C) (Oral)   Resp 16   Ht 6\' 2"  (1.88  m)   Wt 164 lb (74.4 kg)   SpO2 100%   BMI 21.06 kg/m   Physical Exam  Constitutional: He appears well-developed.  HENT:  Patient is not able to tolerate examination of his posterior pharynx. Some injection and possible blood on his bilateral nasal mucosa.  Eyes: EOM are normal.  Neck: Neck supple.  Cardiovascular: Normal rate.   Pulmonary/Chest: Effort normal.  Abdominal: Soft.  Musculoskeletal: He exhibits no tenderness.  Neurological: He is alert.  Skin: Skin is warm. Capillary refill takes less than 2 seconds.  Psychiatric: He has a normal mood and affect.     ED Treatments / Results  Labs (all labs ordered are listed, but only abnormal results are displayed) Labs Reviewed  COMPREHENSIVE METABOLIC PANEL - Abnormal; Notable for the following:       Result Value   ALT 14 (*)    Total Bilirubin 1.5 (*)    All other components within normal limits  LIPASE, BLOOD  CBC WITH DIFFERENTIAL/PLATELET    EKG  EKG Interpretation None       Radiology Dg Chest 2 View  Result Date: 05/12/2016 CLINICAL DATA:  Cough. EXAM: CHEST  2 VIEW COMPARISON:  04/11/2015. FINDINGS: Mediastinum and hilar structures normal. Lungs are clear. No pleural effusion or pneumothorax. IMPRESSION: No acute cardiopulmonary disease. Electronically Signed   By: Marcello Moores  Register   On: 05/12/2016 09:42    Procedures Procedures (including critical care time)  Medications Ordered in ED Medications  sodium chloride 0.9 % bolus 1,000 mL (0 mLs Intravenous Stopped 05/12/16 0949)  ondansetron (ZOFRAN) injection 4 mg (4 mg Intravenous Given 05/12/16 0849)     Initial Impression / Assessment and Plan / ED Course  I have reviewed the triage vital signs and the nursing notes.  Pertinent labs & imaging results that were available during my care of the patient were reviewed by me and considered in my medical decision making (see chart for details).  Clinical Course     Patient with nausea vomiting  diarrhea. Also reportedly coughed up blood. Lab work reassuring. X-ray reassuring. Has tolerated orals here. Will need to follow-up as this primary care doctor further for his hemoptysis.  Final Clinical Impressions(s) / ED Diagnoses   Final diagnoses:  Nausea vomiting and diarrhea  Hemoptysis  Cough    New Prescriptions Discharge Medication List as of 05/12/2016 11:08 AM    START taking these medications   Details  ondansetron (ZOFRAN-ODT) 4 MG disintegrating tablet Take 1 tablet (4 mg total) by mouth every 8 (eight) hours as needed for nausea or vomiting., Starting Wed 05/12/2016, Print         Davonna Belling, MD 05/12/16 1558

## 2016-05-12 NOTE — ED Notes (Signed)
Pt provided water for PO challenge

## 2016-05-13 ENCOUNTER — Encounter (HOSPITAL_COMMUNITY): Payer: Self-pay | Admitting: Emergency Medicine

## 2016-05-13 ENCOUNTER — Emergency Department (HOSPITAL_COMMUNITY)
Admission: EM | Admit: 2016-05-13 | Discharge: 2016-05-13 | Disposition: A | Discharge status: Home / Self Care | Payer: Medicaid Other | Attending: Emergency Medicine | Admitting: Emergency Medicine

## 2016-05-13 DIAGNOSIS — B9789 Other viral agents as the cause of diseases classified elsewhere: Secondary | ICD-10-CM

## 2016-05-13 DIAGNOSIS — J069 Acute upper respiratory infection, unspecified: Secondary | ICD-10-CM | POA: Diagnosis not present

## 2016-05-13 DIAGNOSIS — Z79899 Other long term (current) drug therapy: Secondary | ICD-10-CM | POA: Diagnosis not present

## 2016-05-13 DIAGNOSIS — R05 Cough: Secondary | ICD-10-CM | POA: Diagnosis present

## 2016-05-13 MED ORDER — BENZONATATE 100 MG PO CAPS: 200.0000 mg | ORAL_CAPSULE | Freq: Two times a day (BID) | ORAL | 0 refills | Status: DC | PRN

## 2016-05-13 NOTE — Discharge Instructions (Signed)
You appear to have an upper respiratory infection (URI). An upper respiratory tract infection, or cold, is a viral infection of the air passages leading to the lungs. It is contagious and can be spread to others, especially during the first 3 or 4 days. It cannot be cured by antibiotics or other medicines. °RETURN IMMEDIATELY IF you develop shortness of breath, confusion or altered mental status, a new rash, become dizzy, faint, or poorly responsive, or are unable to be cared for at home. ° °

## 2016-05-13 NOTE — ED Triage Notes (Signed)
Per EMS: pt from home c/o chest wall pain and coughing up some blood; IV 20g L FA; pt seen here yesterday for same

## 2016-05-13 NOTE — ED Notes (Signed)
Patient able to ambulate independently  

## 2016-05-13 NOTE — ED Provider Notes (Signed)
[facility] DEPT Provider Note   CSN: [id number] Arrival date & time: 05/13/16  1848     History   Chief Complaint Chief Complaint  Patient presents with  . Cough    HPI  Franklin [name] is a 35 y.o. 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 He complains of cough described as painful, productive and some streaky blood for 3 days. He denies a history of anorexia, chills, dizziness, fevers, myalgias, nausea, shortness of breath, sweats, vomiting, weakness, weight loss and wheezing and has a history of asthma. Patient denies smoke cigarettes. He says that he has "tried everything" over the counter and nothing makes it better. He was seen yesterday for the same and had a negative work up with negative cxr.     HPI  Past Medical History:  Diagnosis Date  . Bipolar 1 disorder ([place])   . Chronic headaches   . 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 Depression   . Hx of electroencephalogram 12/2014   normal  . Mild cognitive impairment   . Schizophrenic disorder ([place])   . Seizures [facility])     Patient Active Problem List   Diagnosis Date Noted  . 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 Abnormal EKG 11/30/2015  . Elevated troponin 11/30/2015  . Bipolar affective disorder, most recent episode unspecified type, remission status unspecified 03/11/2015  . Bipolar affective disorder ([place]) 01/08/2015  . 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 Seizure disorder ([place]) 10/14/2014    History reviewed. No pertinent surgical history.     Home Medications    Prior to Admission medications   Medication Sig Start  Date End Date Taking? Authorizing Provider  acetaminophen (TYLENOL) 500 MG tablet Take 1,000 mg by mouth every 6 (six) hours as needed for mild pain or headache.    Yes Historical Provider, MD  ibuprofen (ADVIL,MOTRIN) 200 MG tablet Take 400 mg by mouth every 6 (six) hours as needed.   Yes Historical Provider, MD  QUEtiapine Fumarate (SEROQUEL XR) 150 MG 24 hr tablet Take 150 mg by mouth at bedtime.   Yes Historical Provider, MD  albuterol (PROVENTIL HFA;VENTOLIN HFA) 108 (90 Base) MCG/ACT inhaler Inhale 1-2 puffs into the lungs every 6 (six) hours as needed for wheezing or shortness of breath. Patient not taking: Reported on 05/03/2016 09/25/15   Melynda Ripple, MD  famotidine (PEPCID) 20 MG tablet Take 1 tablet (20 mg total) by mouth 2 (two) times daily. Patient not taking: Reported on 05/03/2016 01/05/16   Lacretia Leigh, MD  fluticasone [facility]) 50 MCG/ACT nasal spray Place 2 sprays into both nostrils daily as needed for allergies.  10/01/15   Historical Provider, MD  ondansetron (ZOFRAN-ODT) 4 MG disintegrating tablet Take 1 tablet (4 mg total) by mouth every 8 (eight) hours as needed for nausea or vomiting. 05/12/16   Davonna Belling, MD  OXcarbazepine (TRILEPTAL) 150 MG tablet Take 1 tablet (150 mg total) by mouth 2 (two) times daily. Patient not taking: Reported on 05/03/2016 03/26/16   Cameron Sprang, MD    Family History Family History  Problem Relation Age of Onset  . Heart disease Mother     Social History Social History  Substance Use Topics  . Smoking status: Never Smoker  .  Smokeless tobacco: Never Used  . Alcohol use No     Allergies   Coconut flavor; Coconut oil; Penicillin g; Codeine; Depakote [divalproex sodium]; Penicillins; and Tape   Review of Systems Review of Systems Ten systems reviewed and are negative for acute change, except as noted in the HPI.    Physical Exam Updated Vital Signs BP 131/93   Pulse 65   Temp 99 F (37.2 C) (Oral)   Resp 18   SpO2  98%   Physical Exam  Constitutional: He is oriented to person, place, and time. He appears well-developed and well-nourished.  He appears well, vital signs are as noted.  HENT:  Head: Normocephalic and atraumatic.  Right Ear: External ear normal.  Left Ear: External ear normal.  Mouth/Throat: Oropharynx is clear and moist.  Ears normal.  Throat and pharynx normal.  Nose is congested. Sinuses non tender.   Eyes: EOM are normal. Pupils are equal, round, and reactive to light.  BL injected conjunctivae  Neck: Normal range of motion. Neck supple.  Neck supple. No adenopathy in the neck.   Cardiovascular: Normal rate, regular rhythm and normal heart sounds.   Pulmonary/Chest: Effort normal and breath sounds normal. No respiratory distress. He has no wheezes.  The chest is clear, without wheezes or rales.  Abdominal: Soft. Bowel sounds are normal. He exhibits no distension. There is no tenderness.  Musculoskeletal: Normal range of motion. He exhibits no edema or tenderness.  Neurological: He is alert and oriented to person, place, and time.  Skin: Capillary refill takes less than 2 seconds. No rash noted.  Nursing note and vitals reviewed.    ED Treatments / Results  Labs (all labs ordered are listed, but only abnormal results are displayed) Labs Reviewed - No data to display  EKG  EKG Interpretation None       Radiology Dg Chest 2 View  Result Date: 05/12/2016 CLINICAL DATA:  Cough. EXAM: CHEST  2 VIEW COMPARISON:  04/11/2015. FINDINGS: Mediastinum and hilar structures normal. Lungs are clear. No pleural effusion or pneumothorax. IMPRESSION: No acute cardiopulmonary disease. Electronically Signed   By: Marcello Moores  Register   On: 05/12/2016 09:42    Procedures Procedures (including critical care time)  Medications Ordered in ED Medications - No data to display   Initial Impression / Assessment and Plan / ED Course  I have reviewed the triage vital signs and the nursing  notes.  Pertinent labs & imaging results that were available during my care of the patient were reviewed by me and considered in my medical decision making (see chart for details).  Clinical Course     . Patients symptoms are consistent with URI, likely viral etiology. Discussed that antibiotics are not indicated for viral infections. Pt will be discharged with symptomatic treatment.  Verbalizes understanding and is agreeable with plan. Pt is hemodynamically stable & in NAD prior to dc.  Final Clinical Impressions(s) / ED Diagnoses   Final diagnoses:  Viral URI with cough    New Prescriptions New Prescriptions   No medications on file     Margarita Mail, PA-C 05/13/16 2148    Milton Ferguson, MD 05/17/16 [id number]

## 2016-06-10 ENCOUNTER — Encounter (HOSPITAL_COMMUNITY): Payer: Self-pay

## 2016-06-10 ENCOUNTER — Emergency Department (HOSPITAL_COMMUNITY)
Admission: EM | Admit: 2016-06-10 | Discharge: 2016-06-10 | Disposition: A | Discharge status: Home / Self Care | Payer: Medicaid Other | Attending: Emergency Medicine | Admitting: Emergency Medicine

## 2016-06-10 DIAGNOSIS — Z79899 Other long term (current) drug therapy: Secondary | ICD-10-CM | POA: Diagnosis not present

## 2016-06-10 DIAGNOSIS — R0602 Shortness of breath: Secondary | ICD-10-CM | POA: Insufficient documentation

## 2016-06-10 LAB — CARBOXYHEMOGLOBIN - COOX: Carboxyhemoglobin: 0.8 % (ref 0.5–1.5)

## 2016-06-10 MED ORDER — ALBUTEROL SULFATE HFA 108 (90 BASE) MCG/ACT IN AERS
2.0000 | INHALATION_SPRAY | Freq: Once | RESPIRATORY_TRACT | Status: AC
  Administered 2016-06-10: 2 via RESPIRATORY_TRACT
  Filled 2016-06-10: qty 6.7
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
## 2016-06-12 ENCOUNTER — Encounter (HOSPITAL_COMMUNITY): Payer: Self-pay | Admitting: Emergency Medicine

## 2016-06-12 ENCOUNTER — Emergency Department (HOSPITAL_COMMUNITY)
Admission: EM | Admit: 2016-06-12 | Discharge: 2016-06-12 | Disposition: A | Discharge status: Home / Self Care | Payer: Medicaid Other | Attending: Emergency Medicine | Admitting: Emergency Medicine

## 2016-06-12 DIAGNOSIS — R05 Cough: Secondary | ICD-10-CM | POA: Insufficient documentation

## 2016-06-12 DIAGNOSIS — R059 Cough, unspecified: Secondary | ICD-10-CM
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
## 2016-06-12 NOTE — ED Notes (Signed)
PA at bedside.

## 2016-07-16 ENCOUNTER — Ambulatory Visit: Payer: Self-pay | Admitting: Neurology

## 2016-07-17 ENCOUNTER — Emergency Department (HOSPITAL_BASED_OUTPATIENT_CLINIC_OR_DEPARTMENT_OTHER)
Admit: 2016-07-17 | Discharge: 2016-07-17 | Disposition: A | Discharge status: Home / Self Care | Payer: Medicaid Other | Attending: Emergency Medicine | Admitting: Emergency Medicine

## 2016-07-17 ENCOUNTER — Encounter (HOSPITAL_COMMUNITY): Payer: Self-pay | Admitting: Emergency Medicine

## 2016-07-17 ENCOUNTER — Emergency Department (HOSPITAL_COMMUNITY)
Admission: EM | Admit: 2016-07-17 | Discharge: 2016-07-17 | Disposition: A | Discharge status: Home / Self Care | Payer: Medicaid Other | Attending: Emergency Medicine | Admitting: Emergency Medicine

## 2016-07-17 DIAGNOSIS — Z79899 Other long term (current) drug therapy: Secondary | ICD-10-CM | POA: Insufficient documentation

## 2016-07-17 DIAGNOSIS — M79605 Pain in left leg: Secondary | ICD-10-CM | POA: Diagnosis present

## 2016-07-17 DIAGNOSIS — M79609 Pain in unspecified limb: Secondary | ICD-10-CM

## 2016-07-17 MED ORDER — METHOCARBAMOL 500 MG PO TABS
750.0000 mg | ORAL_TABLET | Freq: Once | ORAL | Status: AC
  Administered 2016-07-17: 750 mg via ORAL
  Filled 2016-07-17: qty 2

## 2016-07-17 MED ORDER — ACETAMINOPHEN 500 MG PO TABS
1000.0000 mg | ORAL_TABLET | Freq: Once | ORAL | Status: AC
  Administered 2016-07-17: 1000 mg via ORAL
  Filled 2016-07-17: qty 2

## 2016-07-17 MED ORDER — IBUPROFEN 400 MG PO TABS
600.0000 mg | ORAL_TABLET | Freq: Once | ORAL | Status: AC
  Administered 2016-07-17: 600 mg via ORAL
  Filled 2016-07-17: qty 1

## 2016-07-17 NOTE — ED Notes (Signed)
Pt ambulated to room from waiting area. Pt had no complaints.

## 2016-07-17 NOTE — ED Notes (Signed)
Declined W/C at D/C and was escorted to lobby by RN. 

## 2016-07-17 NOTE — Progress Notes (Signed)
VASCULAR LAB PRELIMINARY  PRELIMINARY  PRELIMINARY  PRELIMINARY  Left lower extremity venous duplex completed.    Preliminary report:  There is no obvious evidence of DVT or SVT noted in the left lower extremity.  Called report to Carmon Sails, PA-C  Valdez Brannan, RVT 07/17/2016, 10:31 AM

## 2016-07-17 NOTE — ED Triage Notes (Signed)
Pt. Stated, I've had left leg pain for 3-4 days, no injury it just hurts.

## 2016-07-17 NOTE — ED Triage Notes (Signed)
PT given a gown to put on for exam.

## 2016-07-17 NOTE — Discharge Instructions (Signed)
Your ultrasound today was negative.  Your left leg pain is most likely due to a soft tissue injury, most likely a muscular injury.   Please take ibuprofen 600 mg + tylenol 650 three times a day for the next three day.    Please follow up with your primary care doctor next week if your symptoms do not improve or resolve after 3-5 days after taking the ibuprofen and tylenol.   Return to the emergency department if you develop leg weakness, numbness, tingling.
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
## 2016-07-17 NOTE — ED Triage Notes (Signed)
PT ambulating at a rapid stride to exam room in [facility]

## 2016-07-19 ENCOUNTER — Ambulatory Visit: Payer: Self-pay | Admitting: Family Medicine

## 2016-07-19 ENCOUNTER — Telehealth: Payer: Self-pay | Admitting: Neurology

## 2016-07-19 ENCOUNTER — Encounter: Payer: Self-pay | Admitting: Neurology

## 2016-07-19 NOTE — Telephone Encounter (Signed)
Patient needs help medication please call [phone]

## 2016-07-19 NOTE — Telephone Encounter (Signed)
Contacted patient. He states he is still having the [place] delivered to him by pharmacy and he is no longer prescribed medication. Notified pharmacy per Dr. Amparo Bristol last [place] note patient is no longer on Keppra.

## 2016-07-22 ENCOUNTER — Encounter: Payer: Self-pay | Admitting: Family Medicine

## 2016-07-22 ENCOUNTER — Ambulatory Visit: Payer: Medicaid Other | Attending: Family Medicine | Admitting: Family Medicine

## 2016-07-22 VITALS — BP 131/83 | HR 61 | Temp 98.4°F | Ht 72.0 in | Wt 159.4 lb

## 2016-07-22 DIAGNOSIS — F319 Bipolar disorder, unspecified: Secondary | ICD-10-CM | POA: Insufficient documentation

## 2016-07-22 DIAGNOSIS — Z880 Allergy status to penicillin: Secondary | ICD-10-CM | POA: Diagnosis not present

## 2016-07-22 DIAGNOSIS — Z79899 Other long term (current) drug therapy: Secondary | ICD-10-CM | POA: Diagnosis not present

## 2016-07-22 DIAGNOSIS — G3184 Mild cognitive impairment, so stated: Secondary | ICD-10-CM | POA: Diagnosis not present

## 2016-07-22 DIAGNOSIS — R569 Unspecified convulsions: Secondary | ICD-10-CM | POA: Diagnosis not present

## 2016-07-22 DIAGNOSIS — F209 Schizophrenia, unspecified: Secondary | ICD-10-CM | POA: Diagnosis not present

## 2016-07-22 DIAGNOSIS — M25562 Pain in left knee: Secondary | ICD-10-CM

## 2016-07-22 DIAGNOSIS — Z888 Allergy status to other drugs, medicaments and biological substances status: Secondary | ICD-10-CM | POA: Insufficient documentation

## 2016-07-22 MED ORDER — METHOCARBAMOL 750 MG PO TABS: 750.0000 mg | ORAL_TABLET | Freq: Three times a day (TID) | ORAL | 0 refills | Status: DC | PRN

## 2016-07-22 MED ORDER — PREDNISONE 20 MG PO TABS: 20.0000 mg | ORAL_TABLET | Freq: Every day | ORAL | 0 refills | Status: DC

## 2016-07-22 NOTE — Progress Notes (Signed)
Taking a med for epilepsy - doesn't know the name of it  States he was taken off seroquel

## 2016-07-22 NOTE — Patient Instructions (Signed)

## 2016-07-23 NOTE — Progress Notes (Signed)
Subjective:  Patient ID: Franklin [name], male    DOB: Dec 22, 1980  Age: 36 y.o. MRN: [id number]  CC: Leg Pain (left leg)   HPI Franklin [name] is a 36 year old male with a history of seizures, bipolar disorder, mild cognitive impairment who comes into the clinic Complaining of left leg pain described as sharp, aching in the entire lower extremity for the last 12 days but denies swelling. He was seen at the ED and lower extremity Doppler was negative for DVT. He takes ibuprofen at home which does not help symptoms. It hurts to bend for to walk on that leg.  Past Medical History:  Diagnosis Date  . Bipolar 1 disorder ([place])   . Chronic headaches   . 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 Depression   . Hx of electroencephalogram 12/2014   normal  . Mild cognitive impairment   . Schizophrenic disorder ([place])   . Seizures ([place])     History reviewed. No pertinent surgical history.  Allergies  Allergen Reactions  . Coconut Flavor Anaphylaxis and Nausea And Vomiting  . Coconut Oil Anaphylaxis  . Other Anaphylaxis    raisin  . Penicillin G Anaphylaxis  . Codeine Nausea And Vomiting  . Depakote [Divalproex Sodium] Other (See Comments)    Pt states that this medication makes him feel like he is out of it.   Marland Kitchen Penicillins Nausea And Vomiting    Has patient had a PCN reaction causing immediate rash, facial/tongue/throat swelling, SOB or lightheadedness with hypotension: No Has patient had a PCN reaction causing severe rash involving mucus membranes or skin necrosis: No Has patient had a PCN reaction that required hospitalization No Has patient had a PCN reaction occurring within the last 10 years: No If all of the above answers are "NO", then may proceed with Cephalosporin use.    . Tape Other (See Comments)    Reaction:  Tears skin, Pt is able to use paper tape.       Outpatient Medications Prior to  Visit  Medication Sig Dispense Refill  . acetaminophen (TYLENOL) 500 MG tablet Take 1,000 mg by mouth every 6 (six) hours as needed for mild pain or headache.     . ibuprofen (ADVIL,MOTRIN) 200 MG tablet Take 400 mg by mouth every 6 (six) hours as needed for moderate pain.     Marland Kitchen QUEtiapine Fumarate (SEROQUEL XR) 150 MG 24 hr tablet Take 150 mg by mouth at bedtime.    . benzonatate (TESSALON) 100 MG capsule Take 2 capsules (200 mg total) by mouth 2 (two) times daily as needed for cough. (Patient not taking: Reported on 06/12/2016) 20 capsule 0   No facility-administered medications prior to visit.     ROS Review of Systems  Constitutional: Negative for activity change and appetite change.  HENT: Negative for sinus pressure and sore throat.   Respiratory: Negative for chest tightness, shortness of breath and wheezing.   Cardiovascular: Negative for chest pain and palpitations.  Gastrointestinal: Negative for abdominal distention, abdominal pain and constipation.  Genitourinary: Negative.   Musculoskeletal:       See hpi  Psychiatric/Behavioral: Negative for behavioral problems and dysphoric mood.    Objective:  BP 131/83 (BP Location: Right Arm, Patient Position: Sitting, Cuff Size: Small)   Pulse 61   Temp 98.4 F (36.9 C) (Oral)   Ht 6' (1.829 m)   Wt 159 lb 6.4 oz (72.3 kg)  SpO2 100%   BMI 21.62 kg/m   BP/Weight 07/22/2016 07/17/2016 [id number]  Systolic BP A999333 [id number]  Diastolic BP 83 74 76  Wt. (Lbs) 159.4 160.44 -  BMI 21.62 21.76 -      Physical Exam  Constitutional: He is oriented to person, place, and time. He appears well-developed and well-nourished.  Cardiovascular: Normal rate, normal heart sounds and intact distal pulses.   No murmur heard. Pulmonary/Chest: Effort normal and breath sounds normal. He has no wheezes. He has no rales. He exhibits no tenderness.  Abdominal: Soft. Bowel sounds are normal. He exhibits no distension and no mass. There is no  tenderness.  Musculoskeletal: He exhibits no edema.  Entire left lower extremity held in the extended position with flexion of only about 15-unsure if patient is resisting; tenderness to palpation of entire lower extremity which seems to be out of proportion to apply pressure.  Neurological: He is alert and oriented to person, place, and time.     Assessment & Plan:   1. Acute pain of left knee Negative for DVT Physical exam concerning as he stiffened his extremity throughout entire exam. Symptoms seem to be exaggerated as he was seen walking perfectly fine out of the clinic on discharge Advised to resume ibuprofen once he has completed a course of prednisone. Also continue Robaxin.   Meds ordered this encounter  Medications  . predniSONE (DELTASONE) 20 MG tablet    Sig: Take 1 tablet (20 mg total) by mouth daily with breakfast.    Dispense:  5 tablet    Refill:  0  . methocarbamol (ROBAXIN-750) 750 MG tablet    Sig: Take 1 tablet (750 mg total) by mouth every 8 (eight) hours as needed for muscle spasms.    Dispense:  90 tablet    Refill:  0    Follow-up: Return in about 2 weeks (around 08/05/2016) for folow up of knee pain.   Arnoldo Morale MD

## 2016-08-05 ENCOUNTER — Ambulatory Visit: Payer: Self-pay | Admitting: Family Medicine

## 2016-08-11 ENCOUNTER — Emergency Department (HOSPITAL_COMMUNITY)
Admission: EM | Admit: 2016-08-11 | Discharge: 2016-08-11 | Disposition: A | Discharge status: Home / Self Care | Payer: Medicaid Other | Attending: Emergency Medicine | Admitting: Emergency Medicine

## 2016-08-11 ENCOUNTER — Encounter (HOSPITAL_COMMUNITY): Payer: Self-pay

## 2016-08-11 DIAGNOSIS — Z79899 Other long term (current) drug therapy: Secondary | ICD-10-CM | POA: Insufficient documentation

## 2016-08-11 DIAGNOSIS — R569 Unspecified convulsions: Secondary | ICD-10-CM | POA: Insufficient documentation

## 2016-08-11 DIAGNOSIS — F319 Bipolar disorder, unspecified: Secondary | ICD-10-CM | POA: Insufficient documentation

## 2016-08-11 DIAGNOSIS — F99 Mental disorder, not otherwise specified: Secondary | ICD-10-CM | POA: Diagnosis present

## 2016-08-11 LAB — COMPREHENSIVE METABOLIC PANEL
ALT: 17 U/L (ref 17–63)
ANION GAP: 9 (ref 5–15)
AST: 26 U/L (ref 15–41)
Albumin: 4.4 g/dL (ref 3.5–5.0)
Alkaline Phosphatase: 50 U/L (ref 38–126)
BUN: 11 mg/dL (ref 6–20)
CHLORIDE: 104 mmol/L (ref 101–111)
CO2: 26 mmol/L (ref 22–32)
Calcium: 9.6 mg/dL (ref 8.9–10.3)
Creatinine, Ser: 1 mg/dL (ref 0.61–1.24)
Glucose, Bld: 90 mg/dL (ref 65–99)
POTASSIUM: 4.2 mmol/L (ref 3.5–5.1)
SODIUM: 139 mmol/L (ref 135–145)
Total Bilirubin: 0.8 mg/dL (ref 0.3–1.2)
Total Protein: 6.8 g/dL (ref 6.5–8.1)

## 2016-08-11 LAB — CARBAMAZEPINE LEVEL, TOTAL

## 2016-08-11 LAB — ETHANOL

## 2016-08-11 LAB — CK: CK TOTAL: 177 U/L (ref 49–397)

## 2016-08-11 NOTE — ED Provider Notes (Signed)
[facility] DEPT Provider Note   CSN: [id number] Arrival date & time: 08/11/16  2026     History   Chief Complaint Chief Complaint  Patient presents with  . Seizures    HPI Garth Tiedt is a 36 y.o. male.  He is reported to have had a seizure at home. There was no urinary or fecal incontinence. There was no bit lip or tongue. He has a prescription for oxcarbamazepine, and he claims that he is taking it as prescribed. He does have a known history of bipolar disorder. He is for a uncooperative in that he has not asked questions and not allowing staff to touch him even for such things as getting blood pressures. His mother states that this is not unusual for him.   The history is provided by a parent. The history is limited by the condition of the patient (Uncooperative).    Past Medical History:  Diagnosis Date  . Bipolar 1 disorder ([place])   . Chronic headaches   . 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 Depression   . Hx of electroencephalogram 12/2014   normal  . Mild cognitive impairment   . Schizophrenic disorder ([place])   . Seizures [facility])     Patient Active Problem List   Diagnosis Date Noted  . 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 Abnormal EKG 11/30/2015  . Elevated troponin 11/30/2015  . Bipolar affective disorder, most recent episode unspecified type, remission status unspecified 03/11/2015  . Bipolar affective disorder ([place]) 01/08/2015  . Generalized idiopathic epilepsy and epileptic syndromes, without status epilepticus, not intractable ([place]) 11/27/2014  . Seizure disorder ([place]) 10/14/2014    History reviewed. No pertinent surgical history.     Home Medications    Prior to Admission medications   Medication Sig Start Date End Date Taking? Authorizing Provider  acetaminophen (TYLENOL) 500 MG tablet Take 1,000 mg by mouth every 6 (six) hours as  needed for mild pain or headache.     Historical Provider, MD  ibuprofen (ADVIL,MOTRIN) 200 MG tablet Take 400 mg by mouth every 6 (six) hours as needed for moderate pain.     Historical Provider, MD  methocarbamol (ROBAXIN-750) 750 MG tablet Take 1 tablet (750 mg total) by mouth every 8 (eight) hours as needed for muscle spasms. 07/22/16   Arnoldo Morale, MD  predniSONE (DELTASONE) 20 MG tablet Take 1 tablet (20 mg total) by mouth daily with breakfast. 07/22/16   Arnoldo Morale, MD  QUEtiapine Fumarate (SEROQUEL XR) 150 MG 24 hr tablet Take 150 mg by mouth at bedtime.    Historical Provider, MD    Family History Family History  Problem Relation Age of Onset  . Heart disease Mother     Social History Social History  Substance Use Topics  . Smoking status: Never Smoker  . Smokeless tobacco: Never Used  . Alcohol use No     Allergies   Coconut flavor; Coconut oil; Other; Penicillin g; Codeine; Depakote [divalproex sodium]; Penicillins; and Tape   Review of Systems Review of Systems  Unable to perform ROS: Psychiatric disorder     Physical Exam Updated Vital Signs BP 100/63   Pulse (!) 49   Resp 15   SpO2 94%   Physical Exam  Nursing note and vitals reviewed.  36 year old male, resting comfortably and in no acute distress. Vital signs are significant for bradycardia. Oxygen saturation is 94%, which  is normal. Head is normocephalic and atraumatic. PERRLA, EOMI. Oropharynx is clear. Neck is nontender and supple without adenopathy or JVD. Back is nontender and there is no CVA tenderness. Lungs are clear without rales, wheezes, or rhonchi. Chest is nontender. Heart has regular rate and rhythm without murmur. Abdomen is soft, flat, nontender without masses or hepatosplenomegaly and peristalsis is normoactive. Extremities have no cyanosis or edema, full range of motion is present. Skin is warm and dry without rash. Neurologic: Mental status is normal, cranial nerves are intact,  there are no motor or sensory deficits. Psychiatric: Is angry and confrontational. He is mostly nonverbal, and then will curse at staff. This is not unusual for him per his mother.  ED Treatments / Results  Labs (all labs ordered are listed, but only abnormal results are displayed) Labs Reviewed  CARBAMAZEPINE LEVEL, TOTAL - Abnormal; Notable for the following:       Result Value   Carbamazepine Lvl <2.0 (*)    All other components within normal limits  COMPREHENSIVE METABOLIC PANEL  ETHANOL  CK  CBC WITH DIFFERENTIAL/PLATELET  RAPID URINE DRUG SCREEN, HOSP PERFORMED  CBC WITH DIFFERENTIAL/PLATELET  I-STAT CG4 LACTIC ACID, ED     Procedures Procedures (including critical care time)  Medications Ordered in ED Medications - No data to display   Initial Impression / Assessment and Plan / ED Course  I have reviewed the triage vital signs and the nursing notes.  Pertinent lab results that were available during my care of the patient were reviewed by me and considered in my medical decision making (see chart for details).  Probable seizure in patient with known history of seizure disorder. No evidence of significant injury. Screening labs are ordered. Patient initially was not willing to allow blood to be drawn. He has finally consented and lab work is drawn. He did state at one point that he wanted to leave and was asking for his sneakers. Since he is not a threat to himself or others, if he does wish to leave, he will be allowed to do so. Old records are reviewed showing prior ED visits for seizure and bipolar disorder.  Laboratory workup is unremarkable. He is encouraged to make sure that he continues to take his anticonvulsant medications as prescribed and is to follow-up with PCP.  Final Clinical Impressions(s) / ED Diagnoses   Final diagnoses:  Seizure ([place])  Bipolar affective disorder, remission status unspecified ([place])    New Prescriptions New Prescriptions   No  medications on file     Delora Fuel, MD 54/65/68 [id number]

## 2016-08-11 NOTE — ED Notes (Signed)
Pt combative @ this time. Refusing to remove his clothing and stating he wants to leave. Dr. Roxanne Mins @ bedside.

## 2016-08-11 NOTE — ED Triage Notes (Signed)
Pt here by ems for seizures. sts he told family to call 911 and then had seizure. Hx of behavioral issues, appeared post ictal with ems on their arrival. And improved enroute. No complaints, etoh use today. cbg 71

## 2016-08-11 NOTE — Discharge Instructions (Signed)
Make sure to take your medications as prescribed.

## 2016-09-11 ENCOUNTER — Emergency Department (HOSPITAL_COMMUNITY)
Admission: EM | Admit: 2016-09-11 | Discharge: 2016-09-11 | Disposition: A | Discharge status: Home / Self Care | Payer: Medicaid Other | Attending: Emergency Medicine | Admitting: Emergency Medicine

## 2016-09-11 ENCOUNTER — Other Ambulatory Visit: Payer: Self-pay

## 2016-09-11 ENCOUNTER — Emergency Department (HOSPITAL_COMMUNITY)
Admission: EM | Admit: 2016-09-11 | Discharge: 2016-09-11 | Disposition: A | Discharge status: Home / Self Care | Payer: Medicaid Other | Source: Home / Self Care | Attending: Emergency Medicine | Admitting: Emergency Medicine

## 2016-09-11 ENCOUNTER — Encounter (HOSPITAL_COMMUNITY): Payer: Self-pay

## 2016-09-11 ENCOUNTER — Encounter (HOSPITAL_COMMUNITY): Payer: Self-pay | Admitting: Emergency Medicine

## 2016-09-11 DIAGNOSIS — Z79899 Other long term (current) drug therapy: Secondary | ICD-10-CM | POA: Insufficient documentation

## 2016-09-11 DIAGNOSIS — R42 Dizziness and giddiness: Secondary | ICD-10-CM | POA: Insufficient documentation

## 2016-09-11 DIAGNOSIS — F445 Conversion disorder with seizures or convulsions: Secondary | ICD-10-CM

## 2016-09-11 DIAGNOSIS — R569 Unspecified convulsions: Secondary | ICD-10-CM | POA: Insufficient documentation

## 2016-09-11 LAB — CBC WITH DIFFERENTIAL/PLATELET
BASOS PCT: 0 %
Basophils Absolute: 0 10*3/uL (ref 0.0–0.1)
Eosinophils Absolute: 0 10*3/uL (ref 0.0–0.7)
Eosinophils Relative: 0 %
HCT: 40.6 % (ref 39.0–52.0)
HEMOGLOBIN: 13.9 g/dL (ref 13.0–17.0)
Lymphocytes Relative: 18 %
Lymphs Abs: 1.8 10*3/uL (ref 0.7–4.0)
MCH: 29.6 pg (ref 26.0–34.0)
MCHC: 34.2 g/dL (ref 30.0–36.0)
MCV: 86.4 fL (ref 78.0–100.0)
MONO ABS: 0.6 10*3/uL (ref 0.1–1.0)
MONOS PCT: 6 %
Neutro Abs: 7.5 10*3/uL (ref 1.7–7.7)
Neutrophils Relative %: 76 %
Platelets: 165 10*3/uL (ref 150–400)
RBC: 4.7 MIL/uL (ref 4.22–5.81)
RDW: 12.6 % (ref 11.5–15.5)
WBC: 9.9 10*3/uL (ref 4.0–10.5)

## 2016-09-11 LAB — BASIC METABOLIC PANEL
Anion gap: 9 (ref 5–15)
BUN: 12 mg/dL (ref 6–20)
CALCIUM: 9.3 mg/dL (ref 8.9–10.3)
CO2: 26 mmol/L (ref 22–32)
CREATININE: 0.94 mg/dL (ref 0.61–1.24)
Chloride: 102 mmol/L (ref 101–111)
GFR calc Af Amer: >60 mL/min (ref >60)
GFR calc non Af Amer: >60 mL/min (ref >60)
GLUCOSE: 96 mg/dL (ref 65–99)
Potassium: 3.5 mmol/L (ref 3.5–5.1)
Sodium: 137 mmol/L (ref 135–145)

## 2016-09-11 LAB — CBG MONITORING, ED: GLUCOSE-CAPILLARY: 94 mg/dL (ref 65–99)

## 2016-09-11 MED ORDER — OXCARBAZEPINE 150 MG PO TABS: 150.0000 mg | ORAL_TABLET | Freq: Two times a day (BID) | ORAL | 0 refills | Status: DC

## 2016-09-11 MED ORDER — SODIUM CHLORIDE 0.9 % IV BOLUS (SEPSIS)
1000.0000 mL | Freq: Once | INTRAVENOUS | Status: AC
  Administered 2016-09-11: 1000 mL via INTRAVENOUS

## 2016-09-11 MED ORDER — LORAZEPAM 2 MG/ML IJ SOLN
INTRAMUSCULAR | Status: AC
  Administered 2016-09-11: 2 mg
  Filled 2016-09-11: qty 1

## 2016-09-11 NOTE — ED Notes (Signed)
MD at bedside, called for RN. Pt with seizure like activity. MD gave verbal order for 2mg  IV ativan. RN gave as rx'd. Pt with gross motor shaking, bilateral arms and legs. Pt breathing on own. Responds to painful stimuli.

## 2016-09-11 NOTE — ED Notes (Signed)
Pt family called RN in the room saying "he is having a seizure" pt is tapping his leg and has head turned to the left and moving his eyes purposeful.

## 2016-09-11 NOTE — ED Triage Notes (Signed)
Per EMS,  Pt reports having a seizure prior to arrival of EMS. Pt was fighting EMS, stating he did not want to come. Pt was discharged from here this am, given a prescription for trileptal. Ems reports pt did not have any seizure activity with them. Pt is alert, not in acute distress. Pt has hx of Bipolar.

## 2016-09-11 NOTE — ED Notes (Signed)
Pt continues to report feeling dizzy. 2nd liter fluid bolus started. Wife at bedside

## 2016-09-11 NOTE — ED Triage Notes (Signed)
Per EMS: Pt experienced seizure this evening. Per family, pt fell to floor, convulsing. Pt states has not had seizure medications x months. Pt states "it makes me feel sick." Pt denies any pain. Pt a/o x 3.

## 2016-09-11 NOTE — ED Provider Notes (Signed)
I received this patient in signout from Dr. Christy Gentles. We were awaiting fluid bolus and ambulation. Per Dr. Christy Gentles, patient has history of pseudoseizures and reports noncompliance with Trileptal. I was called to the bedside because the nurse had noted some jerking which appeared consistent with pseudoseizure activity. The patient was awake and answering questions for me. His girlfriend noted that last night his seizure-like activity began after he was upset. She also noted that he has bipolar disorder that has not been managed by a psychiatrist and she wonders whether he is having problems with this. I explained that we would need to ambulate to complete his neuro evaluation and the patient initially refused ambulation. He then stated to his girlfriend "come on, take all this stuff off and I'll go to [facility] where the doctors are better." He initially tried to leave with IV in place. We verbally de-escalated him and were able to have him ambulate in hallway. He initially jumped out of bed and walked forward on his own. When we walked with him, he was shuffling and tried leaning back without bending knees to fall backwards. He then demonstrated normal gait back to his bed. I provided him with a refill of Trileptal and emphasized the importance of following up with a psychiatrist, provided with information for [facility]. Patient discharged in satisfactory condition.   Sharlett Iles, MD 09/11/16 [phone]

## 2016-09-11 NOTE — ED Notes (Signed)
Pt given breakfast.

## 2016-09-11 NOTE — ED Notes (Signed)
Attempts made to ambulate patient, pt refusing to stand stating "I will just end up on the floor, I'm too weak".

## 2016-09-11 NOTE — ED Provider Notes (Signed)
[facility] DEPT Provider Note   CSN: [id number] Arrival date & time: 09/11/16  5027  By signing my name below, I, [name], attest that this documentation has been prepared under the direction and in the presence of Ripley Fraise, MD. Electronically Signed: [name], Scribe. 09/11/16. 3:16 AM.   History   Chief Complaint Chief Complaint  Patient presents with  . Seizures   LEVEL 5 CAVEAT DUE TO ALTERED MENTAL STATUS  HPI Franklin [name] is a 36 y.o. male with history of mild cognitive impairment, bipolar disorder, schizophrenia, and non-epileptic seizures who presents to the ED following a witnessed seizure. History obtained from wife who is present. She states that approximately 2 hours ago he was at home when he fell to the floor and had a seizure. Describes "he had his eyes open, unresponsive, and was shaking in all four extremities." She has not witnessed a seizure before. No medications given pre-hospital.   At interview, the patient remains altered and a complete history is limited for those reasons.   The history is provided by the patient. No language interpreter was used.  Seizures   This is a recurrent problem. The current episode started 1 to 2 hours ago. There was 1 seizure. Characteristics include rhythmic jerking. The episode was witnessed. Aura: unknown. There were no medications administered prior to arrival.    Past Medical History:  Diagnosis Date  . Bipolar 1 disorder ([place])   . Chronic headaches   . 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 Depression   . Hx of electroencephalogram 12/2014   normal  . Mild cognitive impairment   . Schizophrenic disorder ([place])   . Seizures [facility])     Patient Active Problem List   Diagnosis Date Noted  . 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 Abnormal EKG 11/30/2015  . Elevated troponin 11/30/2015  .  Bipolar affective disorder, most recent episode unspecified type, remission status unspecified 03/11/2015  . Bipolar affective disorder ([place]) 01/08/2015  . 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 Seizure disorder ([place]) 10/14/2014    History reviewed. No pertinent surgical history.     Home Medications    Prior to Admission medications   Medication Sig Start Date End Date Taking? Authorizing Provider  acetaminophen (TYLENOL) 500 MG tablet Take 1,000 mg by mouth every 6 (six) hours as needed for mild pain or headache.     Historical Provider, MD  ibuprofen (ADVIL,MOTRIN) 200 MG tablet Take 400 mg by mouth every 6 (six) hours as needed for moderate pain.     Historical Provider, MD  methocarbamol (ROBAXIN-750) 750 MG tablet Take 1 tablet (750 mg total) by mouth every 8 (eight) hours as needed for muscle spasms. 07/22/16   Arnoldo Morale, MD  OXcarbazepine (TRILEPTAL) 150 MG tablet Take 150 mg by mouth 2 (two) times daily.    Historical Provider, MD    Family History Family History  Problem Relation Age of Onset  . Heart disease Mother     Social History Social History  Substance Use Topics  . Smoking status: Never Smoker  . Smokeless tobacco: Never Used  . Alcohol use No     Allergies   Coconut flavor; Coconut oil; Other; Penicillin g; Codeine; Depakote [divalproex sodium]; Penicillins; and Tape   Review of Systems Review of Systems  Unable to perform ROS: Mental status change  Physical Exam Updated Vital Signs BP 133/88 (BP Location: Right Arm)   Pulse 65   Temp 98.4 F (36.9 C) (Oral)   Resp 16   SpO2 99%   Physical Exam   CONSTITUTIONAL: somnolent HEAD: Normocephalic/atraumatic, no visible trauma EYES: EOMI/PERRL ENMT: Mucous membranes moist, no evidence of oral trauma NECK: supple no meningeal signs CV: S1/S2 noted, no murmurs/rubs/gallops noted LUNGS: Lungs are clear to  auscultation bilaterally, no apparent distress ABDOMEN: soft, nontender NEURO: Pt is somnolent but wakes up during exam but appears confused.  maex4 EXTREMITIES: pulses normal/equal, full ROM, no visible trauma SKIN: warm, color normal PSYCH: unable to assess  ED Treatments / Results  Labs (all labs ordered are listed, but only abnormal results are displayed) Labs Reviewed  BASIC METABOLIC PANEL  CBC WITH DIFFERENTIAL/PLATELET  CBG MONITORING, ED    EKG  EKG Interpretation  Date/Time:  Saturday September 11 2016 02:37:51 EDT Ventricular Rate:  69 PR Interval:    QRS Duration: 113 QT Interval:  386 QTC Calculation: 414 R Axis:     Text Interpretation:  Sinus rhythm Borderline intraventricular conduction delay RSR' in V1 or V2, probably normal variant ST elev, probable normal early repol pattern No significant change since last tracing Confirmed by Christy Gentles  MD, Raziya Aveni ([id number]) on 09/11/2016 2:46:03 AM       Radiology No results found.  Procedures Procedures (including critical care time)  Medications Ordered in ED Medications  sodium chloride 0.9 % bolus 1,000 mL (not administered)  LORazepam (ATIVAN) 2 MG/ML injection (2 mg  Given 09/11/16 0316)     Initial Impression / Assessment and Plan / ED Course  I have reviewed the triage vital signs and the nursing notes.  Pertinent labs & imaging results that were available during my care of the patient were reviewed by me and considered in my medical decision making (see chart for details).     3:33 AM While I was in the room, pt began to shake both of his arms and started staring off and would not respond to voice.  Given reported h/o seizures, I ordered IV ativan.  He is now improved though not at baseline When reviewing chart/neuro notes, this episodes if similar to prior He is supposed to be on trileptal Will follow closely 4:09 AM Pt stable Resting comfortably Awaiting lab results 6:11 AM Pt stable He is now  awake/alert but reports feeling tired He admits not taking any seizure medications He is improved but still feels to dizzy to walk Will monitor and reassess 7:12 AM Pt still reporting dizziness At signout to dr little:  1. Give 2 liters of normal saline 2. Orthostatics and ambulate 3. if unable to ambulate, consult neuro/admit 4. If he can be discharged, d/c home on trileptal 150BID per previous neuro notes Final Clinical Impressions(s) / ED Diagnoses   Final diagnoses:  Seizure [facility])    New Prescriptions New Prescriptions   No medications on file  I personally performed the services described in this documentation, which was scribed in my presence. The recorded information has been reviewed and is accurate.       Ripley Fraise, MD 09/11/16 [phone]

## 2016-09-11 NOTE — ED Provider Notes (Signed)
[place] DEPT Provider Note   CSN: [id number] Arrival date & time: 09/11/16  1723     History   Chief Complaint Chief Complaint  Patient presents with  . Seizures    HPI Franklin [name] is a 36 y.o. male.  The history is provided by the patient, the spouse and medical records.  Mental Health Problem  Presenting symptoms comment:  Pseudoseizure Patient accompanied by:  Family member Degree of incapacity (severity):  Mild Onset quality:  Sudden Duration: brief. Timing:  Intermittent (provoked by stress) Progression:  Resolved Chronicity:  Recurrent Context: stressful life event   Treatment compliance:  All of the time Relieved by:  Nothing Exacerbated by: stress. Ineffective treatments: Tripleptal. Associated symptoms: no abdominal pain and no chest pain   Associated symptoms comment:  Dizziness Risk factors: hx of mental illness     Past Medical History:  Diagnosis Date  . Bipolar 1 disorder ([place])   . Chronic headaches   . 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 Depression   . Hx of electroencephalogram 12/2014   normal  . Mild cognitive impairment   . Schizophrenic disorder ([place])   . Seizures [facility])     Patient Active Problem List   Diagnosis Date Noted  . 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 Abnormal EKG 11/30/2015  . Elevated troponin 11/30/2015  . Bipolar affective disorder, most recent episode unspecified type, remission status unspecified 03/11/2015  . Bipolar affective disorder ([place]) 01/08/2015  . Generalized idiopathic epilepsy and epileptic syndromes, without status epilepticus, not intractable ([place]) 11/27/2014  . Seizure disorder ([place]) 10/14/2014    History reviewed. No pertinent surgical history.     Home Medications    Prior to Admission medications   Medication Sig Start Date End Date Taking? Authorizing Provider  acetaminophen  (TYLENOL) 500 MG tablet Take 1,000 mg by mouth every 6 (six) hours as needed for mild pain or headache.    Yes Historical Provider, MD  ibuprofen (ADVIL,MOTRIN) 200 MG tablet Take 400 mg by mouth every 6 (six) hours as needed for moderate pain.    Yes Historical Provider, MD  OXcarbazepine (TRILEPTAL) 150 MG tablet Take 1 tablet (150 mg total) by mouth 2 (two) times daily. 09/11/16 10/11/16 Yes Sharlett Iles, MD    Family History Family History  Problem Relation Age of Onset  . Heart disease Mother     Social History Social History  Substance Use Topics  . Smoking status: Never Smoker  . Smokeless tobacco: Never Used  . Alcohol use No     Allergies   Coconut oil; Other; Codeine; Depakote [divalproex sodium]; Penicillins; and Tape   Review of Systems Review of Systems  Constitutional: Negative for chills and fever.  HENT: Negative for ear pain and sore throat.   Eyes: Negative for pain and visual disturbance.  Respiratory: Negative for cough and shortness of breath.   Cardiovascular: Negative for chest pain and palpitations.  Gastrointestinal: Negative for abdominal pain and vomiting.  Genitourinary: Negative for dysuria and hematuria.  Musculoskeletal: Negative for arthralgias and back pain.  Skin: Negative for color change and rash.  Neurological: Positive for seizures. Negative for syncope.  All other systems reviewed and are negative.    Physical Exam Updated Vital Signs SpO2 99%   Physical Exam  Constitutional: He appears well-developed and well-nourished.  HENT:  Head: Normocephalic and atraumatic.  Eyes: Conjunctivae are normal.  Neck: Neck  supple.  Cardiovascular: Normal rate and regular rhythm.   No murmur heard. Pulmonary/Chest: Effort normal and breath sounds normal. No respiratory distress.  Abdominal: Soft. There is no tenderness.  Musculoskeletal: He exhibits no edema.  Neurological: He is alert.  CN 2-12 grossly intact, moving all 4ext,    Skin: Skin is warm and dry.  Psychiatric: He has a normal mood and affect. His behavior is normal.  Quiet, but will answer questions appropriately  Nursing note and vitals reviewed.    ED Treatments / Results  Labs (all labs ordered are listed, but only abnormal results are displayed) Labs Reviewed - No data to display  EKG  EKG Interpretation None       Radiology No results found.  Procedures Procedures (including critical care time)  Medications Ordered in ED Medications - No data to display   Initial Impression / Assessment and Plan / ED Course  I have reviewed the triage vital signs and the nursing notes.  Pertinent labs & imaging results that were available during my care of the patient were reviewed by me and considered in my medical decision making (see chart for details).    Pt with h/o pseudoseizures presents after another one. Was d/c'd from the ED this AM after a similar episode yesterday. Wife says the episodes are provoked by stress, and that their current living situation is very stressful; this afternoon they had some conflict w/the neighbors that resulted in police involvement, and that's when the Pt had another pseudoseizure. She says he was standing when it occurred, shook for several seconds, and then "looked like he was gonna fall" so the PD guided him to the ground; he was alert & verbal immediately after. His only complaint is mild dizziness which he believes is from not eating today.  VS & exam as above. Able to tolerate PO & ambulate in the ED w/o difficulty.  Will discharge the Pt home. Recommending follow-up with PCP. ED return precautions provided. Pt acknowledged understanding of, and concurrence with the plan. All questions answered to his satisfaction. In stable condition at the time of discharge.  Final Clinical Impressions(s) / ED Diagnoses   Final diagnoses:  Pseudoseizure    New Prescriptions New Prescriptions   No medications on  file     Jenny Reichmann, MD 09/11/16 2028    Lajean Saver, MD 09/11/16 2057

## 2016-09-11 NOTE — ED Notes (Signed)
Ambulated pt in hall. Pt began ambulation normal, but towards the end began shuffling his feet. Pt c/o dizziness.

## 2016-09-11 NOTE — ED Notes (Signed)
Pt ambulated with no problems. RN took pt to bus stop in wheelchair pt had no questions and understood directions

## 2016-10-05 ENCOUNTER — Ambulatory Visit: Payer: Self-pay | Admitting: Neurology

## 2016-10-08 ENCOUNTER — Emergency Department (HOSPITAL_COMMUNITY)
Admission: EM | Admit: 2016-10-08 | Discharge: 2016-10-08 | Disposition: A | Discharge status: Home / Self Care | Payer: Medicaid Other | Attending: Emergency Medicine | Admitting: Emergency Medicine

## 2016-10-08 ENCOUNTER — Encounter (HOSPITAL_COMMUNITY): Payer: Self-pay | Admitting: Emergency Medicine

## 2016-10-08 DIAGNOSIS — T426X6A Underdosing of other antiepileptic and sedative-hypnotic drugs, initial encounter: Secondary | ICD-10-CM | POA: Insufficient documentation

## 2016-10-08 DIAGNOSIS — R259 Unspecified abnormal involuntary movements: Secondary | ICD-10-CM | POA: Insufficient documentation

## 2016-10-08 DIAGNOSIS — G40909 Epilepsy, unspecified, not intractable, without status epilepticus: Secondary | ICD-10-CM | POA: Diagnosis present

## 2016-10-08 MED ORDER — OXCARBAZEPINE 300 MG PO TABS
150.0000 mg | ORAL_TABLET | Freq: Once | ORAL | Status: AC
  Administered 2016-10-08: 150 mg via ORAL
  Filled 2016-10-08: qty 1

## 2016-10-08 MED ORDER — OXCARBAZEPINE 150 MG PO TABS: 150.0000 mg | ORAL_TABLET | Freq: Two times a day (BID) | ORAL | 0 refills | Status: DC

## 2016-10-08 NOTE — ED Triage Notes (Signed)
Pt BIB EMS for seizure; per ems pt was on couch when he had a 3 to 5 min gand mal seizure. Pt reports not taking his medications d/t change in "living situation." Pt combative with EMS. Pt a&o x 4, resp e/u, nad noted at this time.

## 2016-10-08 NOTE — ED Notes (Signed)
ED Provider at bedside. 

## 2016-10-08 NOTE — ED Provider Notes (Signed)
[place] DEPT Provider Note   CSN: [id number] Arrival date & time: 10/08/16  1830     History   Chief Complaint Chief Complaint  Patient presents with  . Seizures    HPI Franklin [name] is a 36 y.o. male.  Patient has an extensive history of pseudoseizures. Presents to the emergency department often with similar symptoms. He is followed by outpatient neurology. Currently on Trileptal. Reports he has not taken his Trileptal for 3-4 days. Reports he had full body shaking today that lasted 5-20 minutes, he is unable to give a more narrow window. Consistent with prior seizures. No fevers or chills. No vomiting or diarrhea. No other infectious symptoms. Reports he is now back to his normal self. Brought here by EMS. Vital signs normal and stable in route. Fingerstick 112.   The history is provided by the patient, medical records and the EMS personnel.  Illness  This is a recurrent problem. The current episode started less than 1 hour ago. The problem occurs every several days. The problem has been resolved. Pertinent negatives include no headaches. He has tried nothing for the symptoms.    Past Medical History:  Diagnosis Date  . Bipolar 1 disorder ([place])   . Chronic headaches   . 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 Depression   . Hx of electroencephalogram 12/2014   normal  . Mild cognitive impairment   . Schizophrenic disorder ([place])   . Seizures [facility])     Patient Active Problem List   Diagnosis Date Noted  . 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 Abnormal EKG 11/30/2015  . Elevated troponin 11/30/2015  . Bipolar affective disorder, most recent episode unspecified type, remission status unspecified 03/11/2015  . Bipolar affective disorder ([place]) 01/08/2015  . Generalized idiopathic epilepsy and epileptic syndromes, without status epilepticus, not intractable ([place])  11/27/2014  . Seizure disorder ([place]) 10/14/2014    History reviewed. No pertinent surgical history.     Home Medications    Prior to Admission medications   Medication Sig Start Date End Date Taking? Authorizing Provider  acetaminophen (TYLENOL) 500 MG tablet Take 1,000 mg by mouth every 6 (six) hours as needed for mild pain or headache.     [provider]  ibuprofen (ADVIL,MOTRIN) 200 MG tablet Take 400 mg by mouth every 6 (six) hours as needed for moderate pain.     [provider]  OXcarbazepine (TRILEPTAL) 150 MG tablet Take 1 tablet (150 mg total) by mouth 2 (two) times daily. 09/11/16 10/11/16  Little, Wenda Over[place], MD  OXcarbazepine (TRILEPTAL) 150 MG tablet Take 1 tablet (150 mg total) by mouth 2 (two) times daily. 10/08/16 11/07/16  Maryan Puls, MD    Family History Family History  Problem Relation Age of Onset  . Heart disease Mother     Social History Social History  Substance Use Topics  . Smoking status: Never Smoker  . Smokeless tobacco: Never Used  . Alcohol use No     Allergies   Coconut oil; Other; Codeine; Depakote [divalproex sodium]; Penicillins; and Tape   Review of Systems Review of Systems  Constitutional: Negative for chills and fever.  Eyes: Negative for visual disturbance.  Gastrointestinal: Negative for diarrhea, nausea and vomiting.  Musculoskeletal: Negative for neck pain and neck stiffness.  Neurological: Negative for facial asymmetry, speech difficulty, weakness, numbness and headaches.       Abnormal movements  Physical Exam Updated Vital Signs BP 108/69   Pulse (!) 52   Temp 98 F (36.7 C) (Oral)   Resp 14   Ht 6' (1.829 m)   Wt 65.8 kg   SpO2 97%   BMI 19.67 kg/m   Physical Exam  Constitutional: He appears well-developed and well-nourished.  HENT:  Head: Normocephalic and atraumatic.  Eyes: Conjunctivae are normal.  Neck: Neck supple.  Cardiovascular: Normal rate and regular rhythm.   No  murmur heard. Pulmonary/Chest: Effort normal and breath sounds normal. No respiratory distress.  Abdominal: Soft. There is no tenderness.  Musculoskeletal: He exhibits no edema.  Neurological: He is alert. He has normal strength. No cranial nerve deficit or sensory deficit. He displays a negative Romberg sign. He displays no seizure activity. Coordination normal. GCS eye subscore is 4. GCS verbal subscore is 5. GCS motor subscore is 6.  Skin: Skin is warm and dry.  Psychiatric: He has a normal mood and affect.  Nursing note and vitals reviewed.    ED Treatments / Results  Labs (all labs ordered are listed, but only abnormal results are displayed) Labs Reviewed - No data to display  EKG  EKG Interpretation None       Radiology No results found.  Procedures Procedures (including critical care time)  Medications Ordered in ED Medications  Oxcarbazepine (TRILEPTAL) tablet 150 mg (150 mg Oral Given 10/08/16 1920)     Initial Impression / Assessment and Plan / ED Course  I have reviewed the triage vital signs and the nursing notes.  Pertinent labs & imaging results that were available during my care of the patient were reviewed by me and considered in my medical decision making (see chart for details).     Patient has a well-documented history of pseudoseizures. Is followed by outpatient neurology. Supposed to take Trileptal. Nonadherent to medications. Reports not taking Trileptal for the last 4 days. Today had another episode consistent with his prior pseudoseizures. He is now back to his baseline mental status. Vital signs are normal and stable. Gave him a dose of his Trileptal here. He has no focal neurological deficits on exam. Afebrile. No meningismus on exam. Doubt central infectious process. Fingerstick normal. Strongly encouraged him to take his home medications and to follow up outpatient. Given a prescription for his Trileptal.  Final Clinical Impressions(s) / ED  Diagnoses   Final diagnoses:  Abnormal movements    New Prescriptions Discharge Medication List as of 10/08/2016  7:46 PM    START taking these medications   Details  !! OXcarbazepine (TRILEPTAL) 150 MG tablet Take 1 tablet (150 mg total) by mouth 2 (two) times daily., Starting Fri 10/08/2016, Until Sun 11/07/2016, Print     !! - Potential duplicate medications found. Please discuss with provider.       Maryan Puls, MD 10/09/16 3151    Blanchie Dessert, MD 10/09/16 0030

## 2016-10-09 ENCOUNTER — Emergency Department (HOSPITAL_COMMUNITY)
Admission: EM | Admit: 2016-10-09 | Discharge: 2016-10-10 | Disposition: A | Discharge status: Home / Self Care | Payer: Medicaid Other | Attending: Emergency Medicine | Admitting: Emergency Medicine

## 2016-10-09 ENCOUNTER — Encounter (HOSPITAL_COMMUNITY): Payer: Self-pay

## 2016-10-09 DIAGNOSIS — G40909 Epilepsy, unspecified, not intractable, without status epilepticus: Secondary | ICD-10-CM | POA: Diagnosis not present

## 2016-10-09 DIAGNOSIS — R569 Unspecified convulsions: Secondary | ICD-10-CM

## 2016-10-09 DIAGNOSIS — Z9114 Patient's other noncompliance with medication regimen: Secondary | ICD-10-CM | POA: Insufficient documentation

## 2016-10-09 MED ORDER — OXCARBAZEPINE 300 MG PO TABS
150.0000 mg | ORAL_TABLET | Freq: Once | ORAL | Status: AC
  Administered 2016-10-10: 150 mg via ORAL
  Filled 2016-10-09: qty 1

## 2016-10-09 NOTE — ED Triage Notes (Signed)
Pt here for seizure activity. Hx of same. Here last pm for same. Pt reports that he is unable to get medication for seizure due to friend he is living with and current situation reports not having meds other than what was given last pm. Pt is DM and has cbg of 133 with ems.

## 2016-10-10 ENCOUNTER — Emergency Department (HOSPITAL_COMMUNITY): Payer: Medicaid Other

## 2016-10-10 DIAGNOSIS — R569 Unspecified convulsions: Secondary | ICD-10-CM | POA: Diagnosis not present

## 2016-10-10 LAB — COMPREHENSIVE METABOLIC PANEL
ALT: 13 U/L — AB (ref 17–63)
AST: 16 U/L (ref 15–41)
Albumin: 3.6 g/dL (ref 3.5–5.0)
Alkaline Phosphatase: 52 U/L (ref 38–126)
Anion gap: 6 (ref 5–15)
BUN: 12 mg/dL (ref 6–20)
CHLORIDE: 108 mmol/L (ref 101–111)
CO2: 25 mmol/L (ref 22–32)
CREATININE: 0.94 mg/dL (ref 0.61–1.24)
Calcium: 8.8 mg/dL — ABNORMAL LOW (ref 8.9–10.3)
GFR calc Af Amer: >60 mL/min (ref >60)
Glucose, Bld: 98 mg/dL (ref 65–99)
Potassium: 3.7 mmol/L (ref 3.5–5.1)
SODIUM: 139 mmol/L (ref 135–145)
Total Bilirubin: 1.6 mg/dL — ABNORMAL HIGH (ref 0.3–1.2)
Total Protein: 6.1 g/dL — ABNORMAL LOW (ref 6.5–8.1)

## 2016-10-10 LAB — CBC WITH DIFFERENTIAL/PLATELET
BASOS ABS: 0 10*3/uL (ref 0.0–0.1)
BASOS PCT: 0 %
EOS ABS: 0.1 10*3/uL (ref 0.0–0.7)
EOS PCT: 1 %
HCT: 38.7 % — ABNORMAL LOW (ref 39.0–52.0)
Hemoglobin: 13.4 g/dL (ref 13.0–17.0)
Lymphocytes Relative: 30 %
Lymphs Abs: 1.9 10*3/uL (ref 0.7–4.0)
MCH: 30.1 pg (ref 26.0–34.0)
MCHC: 34.6 g/dL (ref 30.0–36.0)
MCV: 87 fL (ref 78.0–100.0)
MONO ABS: 0.4 10*3/uL (ref 0.1–1.0)
Monocytes Relative: 6 %
Neutro Abs: 4.1 10*3/uL (ref 1.7–7.7)
Neutrophils Relative %: 63 %
PLATELETS: 153 10*3/uL (ref 150–400)
RBC: 4.45 MIL/uL (ref 4.22–5.81)
RDW: 12.8 % (ref 11.5–15.5)
WBC: 6.5 10*3/uL (ref 4.0–10.5)

## 2016-10-10 LAB — CBG MONITORING, ED: GLUCOSE-CAPILLARY: 119 mg/dL — AB (ref 65–99)

## 2016-10-10 LAB — ETHANOL

## 2016-10-10 MED ORDER — LORAZEPAM 2 MG/ML IJ SOLN
2.0000 mg | Freq: Once | INTRAMUSCULAR | Status: AC
  Administered 2016-10-10: 2 mg via INTRAVENOUS

## 2016-10-10 MED ORDER — LORAZEPAM 2 MG/ML IJ SOLN
INTRAMUSCULAR | Status: AC
  Filled 2016-10-10: qty 1

## 2016-10-10 MED ORDER — SODIUM CHLORIDE 0.9 % IV BOLUS (SEPSIS)
1000.0000 mL | Freq: Once | INTRAVENOUS | Status: AC
Start: 2016-10-10 — End: 2016-10-10
  Administered 2016-10-10: 1000 mL via INTRAVENOUS

## 2016-10-10 MED ORDER — OXCARBAZEPINE 150 MG PO TABS
150.0000 mg | ORAL_TABLET | Freq: Two times a day (BID) | ORAL | 0 refills | Status: DC
Start: 2016-10-10 — End: 2016-10-13

## 2016-10-10 NOTE — ED Notes (Signed)
Sandwich bag and orange juice given to patient

## 2016-10-10 NOTE — ED Notes (Signed)
No seizure activity at this time

## 2016-10-10 NOTE — ED Notes (Signed)
Ammonia inhalants pulled and given to [facility]; pt responded with ammonia inhalants and is now responding appropriately

## 2016-10-10 NOTE — ED Notes (Signed)
Pt understood dc material. NAD noted. Script given at dc  

## 2016-10-10 NOTE — Consult Note (Signed)
Neurology Consultation Reason for Consult: Seizure like activity Referring Physician: Betsey Holiday, C  CC: Seizure like activity  History is obtained from: [place], chart  HPI: Franklin [name] is a 36 y.o. male with a history of diagnosed nonepileptic spells which were diagnosed at [facility]. Though he did not have a full epilepsy monitoring unit stay, he did have 3 typical spells which were all confirmed to be nonepileptic. They're described as unresponsiveness, staring off, right hand shaking.  He sees Dr. Delice Lesch and is maintained on a dose of Trileptal for mood stabilization.  He has recently changed where he was staying, and his medication is not with him. He has had multiple spells, and was discharged last night with a prescription. Unfortunately, he has not had money to fill this prescription and presented this evening with recurrent spells.  They're described as staring, associated with shaking of both the arms and legs. There is some question about right-sided predominance. His fiance states that these are his typical spells.  He was given Ativan earlier and is quite somnolent and does not participate in the history because of this.  ROS:  Unable to obtain due to altered mental status.   Past Medical History:  Diagnosis Date  . Bipolar 1 disorder ([place])   . Chronic headaches   . 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 Depression   . Hx of electroencephalogram 12/2014   normal  . Mild cognitive impairment   . Schizophrenic disorder ([place])   . Seizures ([place])      Family History  Problem Relation Age of Onset  . Heart disease Mother      Social History:  reports that he has never smoked. He has never used smokeless tobacco. He reports that he does not drink alcohol or use drugs.   Exam: Current vital signs: BP 106/65   Pulse (!) 46   Temp 98.3 F (36.8 C) (Oral)   Resp 14   Wt 65.8 kg (145  lb)   SpO2 100%   BMI 19.67 kg/m  Vital signs in last 24 hours: Temp:  [98.3 F (36.8 C)] 98.3 F (36.8 C) (05/12 2340) Pulse Rate:  [46-66] 46 (05/13 0100) Resp:  [14-23] 14 (05/13 0100) BP: (105-124)/(65-79) 106/65 (05/13 0100) SpO2:  [99 %-100 %] 100 % (05/13 0100) Weight:  [65.8 kg (145 lb)] 65.8 kg (145 lb) (05/12 2332)   Physical Exam  Constitutional: Appears well-developed and well-nourished.  Psych: Affect appropriate to situation Eyes: No scleral injection HENT: No OP obstrucion Head: Normocephalic.  Cardiovascular: Normal rate and regular rhythm.  Respiratory: Effort normal and breath sounds normal to anterior ascultation GI: Soft.  No distension. There is no tenderness.  Skin: WDI  Neuro: Mental Status: Patient is Severely lethargic, he does not speak but does follow commands. Cranial Nerves: II: Blinks to threat bilaterally. Pupils are equal, round, and reactive to light.   III,IV, VI: Eyes are slightly disconjugate, but quickly conjugate with focus and he does cross midline in both directions. V: Facial sensation is symmetric to temperature VII: Facial movement is symmetric.  VIII: hearing is intact to voice X: Uvula elevates symmetrically XI: Shoulder shrug is symmetric. XII: tongue is midline without atrophy or fasciculations.  Motor: Tone is normal. Bulk is normal. 5/5 strength was present in all four extremities.  Sensory: Sensation is symmetric to light touch and temperature in the arms and legs. 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 If these do represent true epileptic seizures, then they are happening in the setting of medication noncompliance and I would not adjust medications in light of this. I think the most likely, these  represent his underlying spells which have been demonstrated to be nonepileptic in the past.  Recommendations: 1) resume home dose of 150 mg twice a day of Trileptal 2) no further recommendations at this time, please call if neurology can be of further assistance.   Roland Rack, MD [facility] [phone]  If 7pm- 7am, please page neurology on call as listed in [place].

## 2016-10-10 NOTE — ED Notes (Signed)
Called to bedside for seizure like activity, lasting approx 1 minute. Focal seizure activity noted.

## 2016-10-10 NOTE — ED Notes (Signed)
Dr. Kirkpatrick at bedside 

## 2016-10-10 NOTE — ED Notes (Signed)
Patient transported to CT 

## 2016-10-10 NOTE — ED Provider Notes (Signed)
[facility] DEPT Provider Note   CSN: [id number] Arrival date & time: 10/09/16  2329     History   Chief Complaint Chief Complaint  Patient presents with  . Seizures    HPI Franklin [name] is a 36 y.o. male with a hx of Bipolar 1 disorder, seizures, schizophrenia presents to the Emergency Department after witnessed seizure. No families with the patient. He reports he has been off his medications approximately 5-6 days but has been seen every day for the last 3 days due to seizures. Patient reports he was given a prescription for his Trileptal last night but does not have the finances to fill it. Per record review, patient was given a dose last night and did not have any additional seizures.  Patient denies recent infectious symptoms, fever, chills, chest pain, shortness of breath, nausea, vomiting.   The history is provided by the patient and medical records. No language interpreter was used.    Past Medical History:  Diagnosis Date  . Bipolar 1 disorder ([place])   . Chronic headaches   . 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 Depression   . Hx of electroencephalogram 12/2014   normal  . Mild cognitive impairment   . Schizophrenic disorder ([place])   . Seizures [facility])     Patient Active Problem List   Diagnosis Date Noted  . 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 Abnormal EKG 11/30/2015  . Elevated troponin 11/30/2015  . Bipolar affective disorder, most recent episode unspecified type, remission status unspecified 03/11/2015  . Bipolar affective disorder ([place]) 01/08/2015  . Generalized idiopathic epilepsy and epileptic syndromes, without status epilepticus, not intractable ([place]) 11/27/2014  . Seizure disorder ([place]) 10/14/2014    History reviewed. No pertinent surgical history.     Home Medications    Prior to Admission medications   Medication Sig Start Date End  Date Taking? Authorizing Provider  acetaminophen (TYLENOL) 500 MG tablet Take 1,000 mg by mouth every 6 (six) hours as needed for mild pain or headache.     [provider]  ibuprofen (ADVIL,MOTRIN) 200 MG tablet Take 400 mg by mouth every 6 (six) hours as needed for moderate pain.     [provider]  OXcarbazepine (TRILEPTAL) 150 MG tablet Take 1 tablet (150 mg total) by mouth 2 (two) times daily. 10/08/16 11/07/16  Maryan Puls, MD  OXcarbazepine (TRILEPTAL) 150 MG tablet Take 1 tablet (150 mg total) by mouth 2 (two) times daily. 10/10/16 11/09/16  Romeo Zielinski, Jarrett Soho, PA-C    Family History Family History  Problem Relation Age of Onset  . Heart disease Mother     Social History Social History  Substance Use Topics  . Smoking status: Never Smoker  . Smokeless tobacco: Never Used  . Alcohol use No     Allergies   Coconut oil; Other; Codeine; Depakote [divalproex sodium]; Penicillins; and Tape   Review of Systems Review of Systems  Constitutional: Negative for chills and fever.  HENT: Negative for congestion and sore throat.   Respiratory: Negative for chest tightness and shortness of breath.   Cardiovascular: Negative for chest pain.  Gastrointestinal: Negative for abdominal pain, nausea and vomiting.  Genitourinary: Negative for dysuria.  Musculoskeletal: Negative for back pain.  Skin: Negative for rash.  Neurological: Positive for seizures.  All other systems reviewed and are negative.    Physical Exam Updated Vital Signs BP 124/79   Pulse 63  Temp 98.3 F (36.8 C) (Oral)   Resp (!) 23   Wt 65.8 kg   SpO2 99%   BMI 19.67 kg/m   Physical Exam  Constitutional: He is oriented to person, place, and time. He appears well-developed. He appears cachectic. No distress.  HENT:  Head: Normocephalic and atraumatic.  Mouth/Throat: Oropharynx is clear and moist.  Eyes: Conjunctivae and EOM are normal. Pupils are equal, round, and reactive to light.  No scleral icterus.  Neck: Normal range of motion. Neck supple.  Full active and passive ROM without pain No midline or paraspinal tenderness No nuchal rigidity or meningeal signs  Cardiovascular: Normal rate, regular rhythm and intact distal pulses.   Pulmonary/Chest: Effort normal and breath sounds normal. No respiratory distress. He has no wheezes. He has no rales.  Abdominal: Soft. Bowel sounds are normal. There is no tenderness. There is no rebound and no guarding.  Musculoskeletal: Normal range of motion.  Lymphadenopathy:    He has no cervical adenopathy.  Neurological: He is alert and oriented to person, place, and time. No cranial nerve deficit. He exhibits normal muscle tone. Coordination normal.  Mental Status:  Alert, oriented, thought content appropriate. Speech fluent without evidence of aphasia. Able to follow 2 step commands without difficulty.  Cranial Nerves:  II: pupils equal, round, reactive to light III,IV, VI: ptosis not present, extra-ocular motions intact bilaterally  V,VII: smile symmetric,  VIII: hearing grossly normal bilaterally  IX,X: midline uvula rise  XI: bilateral shoulder shrug equal and strong XII: midline tongue extension  Motor:  5/5 in upper and lower extremities bilaterally including strong and equal grip strength and dorsiflexion/plantar flexion Sensory: light touch normal in all extremities.  Cerebellar: normal finger-to-nose with bilateral upper extremities Gait: gait testing deferred CV: distal pulses palpable throughout   Skin: Skin is warm and dry. No rash noted. He is not diaphoretic.  Psychiatric: He has a normal mood and affect. His behavior is normal. Judgment and thought content normal.  Nursing note and vitals reviewed.    ED Treatments / Results  Labs (all labs ordered are listed, but only abnormal results are displayed) Labs Reviewed  CBC WITH DIFFERENTIAL/PLATELET - Abnormal; Notable for the following:       Result Value    HCT 38.7 (*)    All other components within normal limits  COMPREHENSIVE METABOLIC PANEL - Abnormal; Notable for the following:    Calcium 8.8 (*)    Total Protein 6.1 (*)    ALT 13 (*)    Total Bilirubin 1.6 (*)    All other components within normal limits  CBG MONITORING, ED - Abnormal; Notable for the following:    Glucose-Capillary 119 (*)    All other components within normal limits  ETHANOL  URINALYSIS, ROUTINE W REFLEX MICROSCOPIC  RAPID URINE DRUG SCREEN, HOSP PERFORMED    EKG  EKG Interpretation  Date/Time:  Sunday Oct 10 2016 02:05:30 EDT Ventricular Rate:  50 PR Interval:    QRS Duration: 130 QT Interval:  455 QTC Calculation: 415 R Axis:   -19 Text Interpretation:  Sinus arrhythmia Nonspecific intraventricular conduction delay No significant change since last tracing Confirmed by POLLINA  MD, CHRISTOPHER [phone]) on 10/10/2016 2:12:33 AM       Radiology Ct Head Wo Contrast  Result Date: 10/10/2016 CLINICAL DATA:  36 y/o  M; seizure activity. EXAM: CT HEAD WITHOUT CONTRAST TECHNIQUE: Contiguous axial images were obtained from the base of the skull through the vertex without intravenous contrast. COMPARISON:  12/14/2015 CT of the head. FINDINGS: Brain: No evidence of acute infarction, hemorrhage, hydrocephalus, extra-axial collection or mass lesion/mass effect. Vascular: No hyperdense vessel or unexpected calcification. Skull: Normal. Negative for fracture or focal lesion. Sinuses/Orbits: No acute finding. Other: None. IMPRESSION: No acute intracranial abnormality.  Unremarkable CT of the head. Electronically Signed   By: Kristine Garbe M.D.   On: 10/10/2016 05:45    Procedures Procedures (including critical care time)  Medications Ordered in ED Medications  Oxcarbazepine (TRILEPTAL) tablet 150 mg (150 mg Oral Given 10/10/16 0005)  LORazepam (ATIVAN) injection 2 mg (2 mg Intravenous Given 10/10/16 0042)  sodium chloride 0.9 % bolus 1,000 mL (0 mLs  Intravenous Stopped 10/10/16 0525)     Initial Impression / Assessment and Plan / ED Course  I have reviewed the triage vital signs and the nursing notes.  Pertinent labs & imaging results that were available during my care of the patient were reviewed by me and considered in my medical decision making (see chart for details).  Clinical Course as of Oct 10 637  Sun Oct 10, 2016  0046 Pt now with second seizure.  More focal with right sided shaking, foaming at the mouth and left sided gaze.  Pt given 2mg  Ativan.  [HM]  [id number] Patient's fianc now at bedside. She reports one seizure earlier this evening similar to what was witnessed here in the emergency department.  [HM]  [id number] Discussed with Dr. Leonel Ramsay who will evaluate  [HM]  [phone] Patient is currently having difficulty walking unassisted. He reports he is very lightheaded and feels like he might pass out. We will give fluids and obtain CT scan. He is easily arousable and remains alert and oriented.  [HM]  [id number] Pt ambulates without assistance in the hall.  He remains alert and oriented  [HM]  [phone] RN called me into the room reporting that the pt seemed to have a tremor and was now not answering questions.  He is no longer shaking on my reassessment, his eyes are open and he refuses to look at me or follow commands.  Pt however, immediately becomes alert with ammonia inhalent.    [HM]    Clinical Course User Index [HM] Aylen Stradford, Jarrett Soho, [place]    Patient presents with seizure activity.  He has been off his Trileptal for several days. He was evaluated yesterday and given a dose of his home medications. He was seen by case management and given a new prescription however he did not fill this. On my exam today he was baseline mental status and had a normal neurologic exam. Last on the emergency department patient had a witnessed seizure. He was given 2 mg of Ativan. Afterwards he complained of being lightheaded and unable to ambulate. Exam while  labs were reassuring. CT scan was obtained which was unremarkable. Patient was given fluid bolus and ambulated without assistance in the hall. He was without orthostasis.  After being informed that he would be going home, patient had an episode of shaking and refused to follow commands however became immediately alert with ammonia inhalant. Doubt seizure activity during this last episode. Patient is currently eating without difficulty in the room. He will be discharged home with small prescription for Trileptal to get him through until he can obtain his medications in 2 days.    The patient was discussed with and seen by Dr. Betsey Holiday who agrees with the treatment plan.   Final Clinical Impressions(s) / ED Diagnoses   Final diagnoses:  Seizure (  [facility])  Noncompliance with medication regimen    New Prescriptions Current Discharge Medication List       Agapito Games 10/10/16 3559    Orpah Greek, MD 10/11/16 [phone]

## 2016-10-10 NOTE — ED Notes (Signed)
MD aware of pt current heart rate

## 2016-10-10 NOTE — ED Notes (Signed)
Family member pressed call light for pt having "seizure-like episode". On assessment, pt nonverbal with tremors noted to L arm last ~20 seconds. Jarrett Soho PA contacted and to bedside. All VS remained the same through episode

## 2016-10-10 NOTE — Discharge Instructions (Signed)
1. Medications: Trileptal, usual home medications 2. Treatment: rest, drink plenty of fluids,  3. Follow Up: Please followup with your primary doctor in 1-2 days for discussion of your diagnoses and further evaluation after today's visit; if you do not have a primary care doctor use the resource guide provided to find one; Please return to the ER for persistent seizures or other concerns

## 2016-10-11 ENCOUNTER — Encounter (HOSPITAL_COMMUNITY): Payer: Self-pay | Admitting: *Deleted

## 2016-10-11 ENCOUNTER — Emergency Department (HOSPITAL_COMMUNITY)
Admission: EM | Admit: 2016-10-11 | Discharge: 2016-10-11 | Disposition: A | Discharge status: Home / Self Care | Payer: Medicaid Other | Attending: Emergency Medicine | Admitting: Emergency Medicine

## 2016-10-11 DIAGNOSIS — Z79899 Other long term (current) drug therapy: Secondary | ICD-10-CM | POA: Insufficient documentation

## 2016-10-11 DIAGNOSIS — R569 Unspecified convulsions: Secondary | ICD-10-CM

## 2016-10-11 DIAGNOSIS — R112 Nausea with vomiting, unspecified: Secondary | ICD-10-CM | POA: Insufficient documentation

## 2016-10-11 DIAGNOSIS — G40909 Epilepsy, unspecified, not intractable, without status epilepticus: Secondary | ICD-10-CM | POA: Insufficient documentation

## 2016-10-11 DIAGNOSIS — R109 Unspecified abdominal pain: Secondary | ICD-10-CM | POA: Insufficient documentation

## 2016-10-11 NOTE — ED Provider Notes (Signed)
[facility] DEPT Provider Note   CSN: [id number] Arrival date & time: 10/11/16  1233     History   Chief Complaint Chief Complaint  Patient presents with  . Seizures    HPI Franklin [name] is a 36 y.o. male.  HPI   36 y.o. male with a history of diagnosed nonepileptic spells which were diagnosed at [facility] where he had 3 typical spells which were all confirmed to be nonepileptic. They're described as unresponsiveness, staring off, right hand shaking.  He sees Dr. Delice Lesch and is maintained on a dose of Trileptal for mood stabilization. Had not been on the medication and has been seen frequently in the emergency department for seizure-like activity over the last week.  Reports he was initially unable to fill the rx but received help and filled it and took first dose this AM.  [place] symptoms similar to prior episodes per fiance. He does not recall the event. She reports he developed unresponsiveness and right arm shaking. He appeared to be in and out of consciousness with intermittent shaking for 20 minutes. He reported prior to event he felt abdominal cramping, nausea, and after also had cramping, nausea and vomiting. He had just had lunch. No fevers. Pain was diffuse.  Fiance reports he was initially agitated after this episode of shaking, reports he has not been agitated like this before following a seizure. She also reports her son had one seizure and died from seizure.   Patient now back to baseline, denies continuing abdominal pain, nausea. Patient reports he has been under more stress, denies SI/HI.   Past Medical History:  Diagnosis Date  . Bipolar 1 disorder ([place])   . Chronic headaches   . 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 Depression   . Hx of electroencephalogram 12/2014   normal  . Mild cognitive impairment   . Schizophrenic disorder ([employer])    . Seizures [facility])     Patient Active Problem List   Diagnosis Date Noted  . 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 Abnormal EKG 11/30/2015  . Elevated troponin 11/30/2015  . Bipolar affective disorder, most recent episode unspecified type, remission status unspecified 03/11/2015  . Bipolar affective disorder ([place]) 01/08/2015  . Generalized idiopathic epilepsy and epileptic syndromes, without status epilepticus, not intractable ([place]) 11/27/2014  . Seizure disorder ([place]) 10/14/2014    History reviewed. No pertinent surgical history.     Home Medications    Prior to Admission medications   Medication Sig Start Date End Date Taking? Authorizing Provider  acetaminophen (TYLENOL) 500 MG tablet Take 1,000 mg by mouth every 6 (six) hours as needed for mild pain or headache.    Yes [provider]  ibuprofen (ADVIL,MOTRIN) 200 MG tablet Take 400 mg by mouth every 6 (six) hours as needed for moderate pain.    Yes [provider]  OXcarbazepine (TRILEPTAL) 150 MG tablet Take 1 tablet (150 mg total) by mouth 2 (two) times daily. 10/08/16 11/07/16 Yes Maryan Puls, MD  OXcarbazepine (TRILEPTAL) 150 MG tablet Take 1 tablet (150 mg total) by mouth 2 (two) times daily. 10/10/16 11/09/16  Mut[place]baugh, Jarrett Soho, PA-C    Family History Family History  Problem Relation Age of Onset  . Heart disease Mother     Social History Social History  Substance Use Topics  . Smoking status: Never Smoker  . Smokeless tobacco:  Never Used  . Alcohol use No     Allergies   Coconut oil; Other; Codeine; Depakote [divalproex sodium]; Penicillins; and Tape   Review of Systems Review of Systems  Constitutional: Negative for fever.  HENT: Negative for sore throat.   Eyes: Negative for visual disturbance.  Respiratory: Negative for shortness of breath.   Cardiovascular: Negative for chest pain.  Gastrointestinal: Positive for abdominal pain, nausea and vomiting.  Negative for constipation and diarrhea.  Genitourinary: Negative for difficulty urinating.  Musculoskeletal: Negative for back pain and neck stiffness.  Skin: Negative for rash.  Neurological: Positive for seizures. Negative for headaches.     Physical Exam Updated Vital Signs BP 124/83 (BP Location: Left Arm)   Pulse (!) 55   Temp 98.7 F (37.1 C) (Oral)   Resp 18   SpO2 99%   Physical Exam  Constitutional: He is oriented to person, place, and time. He appears well-developed and well-nourished. No distress.  HENT:  Head: Normocephalic and atraumatic.  Eyes: Conjunctivae and EOM are normal.  Neck: Normal range of motion.  Cardiovascular: Normal rate, regular rhythm, normal heart sounds and intact distal pulses.  Exam reveals no gallop and no friction rub.   No murmur heard. Pulmonary/Chest: Effort normal and breath sounds normal. No respiratory distress. He has no wheezes. He has no rales.  Abdominal: Soft. He exhibits no distension. There is no tenderness. There is no guarding.  Musculoskeletal: He exhibits no edema.  Neurological: He is alert and oriented to person, place, and time. He has normal strength. No cranial nerve deficit or sensory deficit. Coordination normal. GCS eye subscore is 4. GCS verbal subscore is 5. GCS motor subscore is 6.  Skin: Skin is warm and dry. He is not diaphoretic.  Nursing note and vitals reviewed.    ED Treatments / Results  Labs (all labs ordered are listed, but only abnormal results are displayed) Labs Reviewed - No data to display  EKG  EKG Interpretation None       Radiology No results found.  Procedures Procedures (including critical care time)  Medications Ordered in ED Medications - No data to display   Initial Impression / Assessment and Plan / ED Course  I have reviewed the triage vital signs and the nursing notes.  Pertinent labs & imaging results that were available during my care of the patient were reviewed by  me and considered in my medical decision making (see chart for details).     36 y.o. male with a history of diagnosed nonepileptic spells which were diagnosed at [facility], seen in the ED for the past 2 days with seizure-like activity, several visits one month ago for same, bipolar, depression presents with concern for seizure-like activity. Patient at baseline at time of my evaluation.  Given patient history, suspect it likely represents nonepileptic seizure, however question of pt with postictal state may represent seizure. Patient had work up yesterday including normal CT and labs. Does not have symptoms other than 2 episodes of emesis just prior to arrival and doubt significant electrolyte abnormality or anemia as etiology of seizure-like event in setting of normal labs yesterday. Abdominal exam benign, doubt appendicitis, cholecystitis, obstruction.   Given he just initiated trileptal (for mood stabilization, but may also treat seizure) this morning, discussed he should continue taking medications as rx and would not recommend changes at this time. Rec stress management, taking meds as rx.  Patient discharged in stable condition with understanding of reasons to return.  Final Clinical Impressions(s) / ED Diagnoses   Final diagnoses:  Seizure-like activity [facility])    New Prescriptions Discharge Medication List as of 10/11/2016  2:02 PM       Gareth Morgan, MD 10/12/16 1418

## 2016-10-11 NOTE — ED Notes (Signed)
Bed: WA24 Expected date:  Expected time:  Means of arrival:  Comments: ems 

## 2016-10-11 NOTE — Care Management (Signed)
ED CM noted that patient has had 11 ED visits in the past 6 months for similar isssues. Patient is noted to have [place] Medicaid as health care coverage. Patient was seen at [facility] yesterday and instructed to follow up. Patient is a patient of the [facility] Dr. Jarold Song PCP. CM will notify Clinic's CM regarding patient.

## 2016-10-11 NOTE — ED Notes (Addendum)
Pt declined to have his blood sugar checked.

## 2016-10-11 NOTE — ED Triage Notes (Signed)
Pt bib EMS and presents after seizure witnessed by pt's fiance.  Pt's fiance reports pt was shaking and then sat down.  Hx Seizures.  On arrival to ED, pt is still post-ictal in that pt is nonverbal and will not allow you to touch pt.

## 2016-10-12 ENCOUNTER — Telehealth: Payer: Self-pay

## 2016-10-12 NOTE — Telephone Encounter (Signed)
Message received from [place], RN CM noting that the patient has been in the ED the past 3 days.  Call placed to the patient. He stated that he is currently in [place] trying to relocate back to [place]. He reported that he has been relying on friends/family to bring him his medication, so he does not always have it available.  He also said that he is not always able to afford the copays for the medication.  He currently uses [facility].  Explained to him that the [facility] has a pharmacy and he would be able to put the charges for the medications on an account and then pay off the balance as he is able.  Also explained that the [facility] has a SW available if needed to address community resources.  He was very appreciative of the call and said he would be at his appointment tomorrow - 10/13/16 @ 0830. Instructed him to bring all of his medications with him to his appointment.    Update provided to Wendi Maya, RN CM

## 2016-10-13 ENCOUNTER — Telehealth: Payer: Self-pay

## 2016-10-13 ENCOUNTER — Ambulatory Visit: Payer: Medicaid Other | Attending: Family Medicine | Admitting: Family Medicine

## 2016-10-13 ENCOUNTER — Encounter: Payer: Self-pay | Admitting: Neurology

## 2016-10-13 ENCOUNTER — Ambulatory Visit (INDEPENDENT_AMBULATORY_CARE_PROVIDER_SITE_OTHER): Payer: Medicaid Other | Admitting: Neurology

## 2016-10-13 ENCOUNTER — Encounter: Payer: Self-pay | Admitting: Family Medicine

## 2016-10-13 ENCOUNTER — Ambulatory Visit: Payer: Self-pay | Admitting: Neurology

## 2016-10-13 VITALS — BP 117/67 | Temp 98.0°F | Resp 16 | Ht 72.0 in | Wt 156.0 lb

## 2016-10-13 VITALS — BP 106/62 | HR 47 | Ht 72.0 in | Wt 156.0 lb

## 2016-10-13 DIAGNOSIS — G3184 Mild cognitive impairment, so stated: Secondary | ICD-10-CM | POA: Insufficient documentation

## 2016-10-13 DIAGNOSIS — F319 Bipolar disorder, unspecified: Secondary | ICD-10-CM

## 2016-10-13 DIAGNOSIS — F445 Conversion disorder with seizures or convulsions: Secondary | ICD-10-CM

## 2016-10-13 DIAGNOSIS — G40909 Epilepsy, unspecified, not intractable, without status epilepticus: Secondary | ICD-10-CM | POA: Diagnosis not present

## 2016-10-13 DIAGNOSIS — Z880 Allergy status to penicillin: Secondary | ICD-10-CM | POA: Diagnosis not present

## 2016-10-13 DIAGNOSIS — F3175 Bipolar disorder, in partial remission, most recent episode depressed: Secondary | ICD-10-CM

## 2016-10-13 DIAGNOSIS — R569 Unspecified convulsions: Secondary | ICD-10-CM | POA: Diagnosis present

## 2016-10-13 MED ORDER — OXCARBAZEPINE 300 MG PO TABS: 300.0000 mg | ORAL_TABLET | Freq: Two times a day (BID) | ORAL | 11 refills | Status: DC

## 2016-10-13 NOTE — Progress Notes (Signed)
NEUROLOGY FOLLOW UP OFFICE NOTE  Franklin [name] [id number]  DOB: 03/05/2016  HISTORY OF PRESENT ILLNESS: I had the pleasure of seeing Franklin [name] in follow-up in the neurology clinic on 10/13/2016. 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 I had discussed the diagnosis with him on his last visit in October, and it appears he had several ER visits for other reasons but not for seizures for almost 5 months, until March when he started having more frequent ER visits, 3 so far this month. With all of these episodes, there is note of increased stress at home. He endorses stress again today, stating his girlfriend is "stressing me out, too much." Per ER notes, his partner reported their living situation is very stressful and they had some conflict with their neighbors with police involvement. He has still not established care with [facility]. He was started on Oxcarbazepine 150mg  BID for mood stabilization until he established with [facility], but it appears he has not been taking this regularly. He denies any side effects to the medication when he takes it.   Diagnostic Data from [facility] video EEG monitoring: He was only there for a few hours, he became progressively agitated and uncooperative, but did have 3 typical spells that were confirmed by his cousin/PCA Franklin [name] as typical events. He was kicking his legs, unresponsive, staring off, flailing around the bed during one event. The second episode he was noted to have right hand shaking, trying to get out of bed, fidgeting around the bed. The third push button was for fighting the nurses and getting agitated, pulling his IV out. All three episodes did not show any EEG correlate. It was noted that it is unclear whether all his spells are non-epileptic, however baseline uncontrolled psychosis made him unable to comply with [name] monitoring to continue testing. He was seen stat by Psychiatry and felt that outpatient psychiatry  was appropriate.   HPI: This is a 36 yo RH man with a history of bipolar disorder, mild cognitive impairment, and seizures since age 36 or 36. He and his cousin both report that seizures would start with staring and unresponsiveness, followed by low amplitude shaking of both hands. Sometimes he would have violent leg kicking. They state that shaking last from 30 minutes up to "1-1/2 hours" followed by confusion for around 20 minutes. He would be sleepy after a seizure. He has had urinary incontinence with some of them, no tongue bite or other significant injuries. He reports all seizures occur during wakefulness, no nocturnal seizures that he is aware of, but woke up one time with urinary incontinence. He denies any olfactory/gustatory hallucinations, deja vu, rising epigastric sensation, focal numbness/tingling/weakness, myoclonic jerks. He occasionally drops things from his hands. He reports that Depakote was started at age 36, however he has only been taking 250mg /day, stating that when he was on 500 or 750mg , he would be "like a zombie, walking into doors." The low dose Depakote has not controlled the seizures, he reports seizures occurring every 3-4 days, longest seizure-free interval probably 1-2 months.   On review of records on EPIC, he has been to the ER several times since 2008 with no note of a seizure history until 2014. There are some records of patient being on Depakote, Tegretol, and Haldol, presumably for Bipolar disorder. He reports chronic headaches occurring 2-3 times a week with sharp pain in the back of his head, "like a needle sticking in it" lasting 30-45 minutes. He would  feel lightheaded. No associated nausea, vomiting, photo/phonophobia, or visual obscurations. He takes Tylenol or aspirin with good effect. He lives with his girlfriend. He finished 12th grade in special education classes. He does not drive.  Epilepsy Risk Factors: His maternal uncle used to have seizures. He was born  premature, with mild cognitive impairment. Otherwise he denies any history of febrile convulsions, CNS infections such as meningitis/encephalitis, significant traumatic brain injury, neurosurgical procedures.  PAST MEDICAL HISTORY: Past Medical History:  Diagnosis Date  . Bipolar 1 disorder ([place])   . Chronic headaches   . 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 Depression   . Hx of electroencephalogram 12/2014   normal  . Mild cognitive impairment   . Schizophrenic disorder ([place])   . Seizures [facility])     MEDICATIONS:   Outpatient Encounter Prescriptions as of 10/13/2016  Medication Sig  . OXcarbazepine (TRILEPTAL) 150 MG tablet Take 1 tablet (150 mg total) by mouth 2 (two) times daily.  Marland Kitchen acetaminophen (TYLENOL) 500 MG tablet Take 1,000 mg by mouth every 6 (six) hours as needed for mild pain or headache.   . ibuprofen (ADVIL,MOTRIN) 200 MG tablet Take 400 mg by mouth every 6 (six) hours as needed for moderate pain.   . [DISCONTINUED] OXcarbazepine (TRILEPTAL) 150 MG tablet Take 1 tablet (150 mg total) by mouth 2 (two) times daily.   No facility-administered encounter medications on file as of 10/13/2016.      ALLERGIES: Allergies  Allergen Reactions  . Coconut Oil Anaphylaxis and Nausea And Vomiting    Reaction to any kind of coconut  . Other Anaphylaxis    raisin  . Codeine Nausea And Vomiting  . Depakote [Divalproex Sodium] Other (See Comments)    Pt states that this medication makes him feel like he is out of it.   Marland Kitchen Penicillins Nausea And Vomiting    Has patient had a PCN reaction causing immediate rash, facial/tongue/throat swelling, SOB or lightheadedness with hypotension: No Has patient had a PCN reaction causing severe rash involving mucus membranes or skin necrosis: No Has patient had a PCN reaction that required hospitalization No Has patient had a PCN reaction occurring within the last 10  years: No If all of the above answers are "NO", then may proceed with Cephalosporin use.    . Tape Other (See Comments)    Reaction:  Tears skin, Pt is able to use paper tape.      FAMILY HISTORY: Family History  Problem Relation Age of Onset  . Heart disease Mother     SOCIAL HISTORY: Social History   Social History  . Marital status: Single    Spouse name: N/A  . Number of children: N/A  . Years of education: N/A   Occupational History  . Not on file.   Social History Main Topics  . Smoking status: Never Smoker  . Smokeless tobacco: Never Used  . Alcohol use No  . Drug use: No  . Sexual activity: Not Currently    Birth control/ protection: Condom   Other Topics Concern  . Not on file   Social History Narrative  . No narrative on file    REVIEW OF SYSTEMS: Constitutional: No fevers, chills, or sweats, no generalized fatigue, change in appetite Eyes: No visual changes, double vision, eye pain Ear, nose and throat: No hearing loss, ear pain, nasal congestion, sore throat Cardiovascular: No chest pain, palpitations Respiratory:  No  shortness of breath at rest or with exertion, wheezes GastrointestinaI: No nausea, vomiting, diarrhea, abdominal pain, fecal incontinence Genitourinary:  No dysuria, urinary retention Musculoskeletal:  No neck pain, back pain Integumentary: No rash, pruritus, skin lesions Neurological: as above Psychiatric: No depression, insomnia, anxiety Endocrine: No palpitations, fatigue, diaphoresis, mood swings, change in appetite, change in weight, increased thirst Hematologic/Lymphatic:  No anemia, purpura, petechiae. Allergic/Immunologic: no itchy/runny eyes, nasal congestion, recent allergic reactions, rashes  PHYSICAL EXAM: Vitals:   10/13/16 1448  BP: 106/62  Pulse: (!) 47   General: No acute distress, pleasant in the office today Head:  Normocephalic/atraumatic Neck: supple, no paraspinal tenderness, full range of motion Heart:   Regular rate and rhythm Lungs:  Clear to auscultation bilaterally Back: No paraspinal tenderness Skin/Extremities: No rash, no edema Neurological Exam: alert and oriented to person, place, and time. No aphasia or dysarthria. Fund of knowledge is appropriate.  Recent and remote memory are intact.  Attention and concentration are normal.    Able to name objects and repeat phrases. Cranial nerves: Pupils equal, round, reactive to light.  Extraocular movements intact with no nystagmus. Visual fields full. Facial sensation intact. No facial asymmetry. Tongue, uvula, palate midline.  Motor: Bulk and tone normal, muscle strength 5/5 throughout with no pronator drift.  Sensation to light touch intact.  No extinction to double simultaneous stimulation.  Deep tendon reflexes 2+ throughout, toes downgoing.  Finger to nose testing intact.  Gait wide-based, favoring right leg (similar to prior), no ataxia. Romberg negative.  IMPRESSION: This is a 36 yo RH man with a history of bipolar disorder, mild cognitive impairment, with a report of history of seizures since age 60 or 54. Review of records from ER visits in the past have no indication of seizures or seizure medication until 2014. He was admitted at [facility] for vEEG monitoring but only stayed for a few hours due to increasing agitation. He did have 3 psychogenic non-epileptic events (PNES) with staring/unresponsiveness, right sided shaking, and shaking/flailing on bed. It is most likely that his seizures are all non-epileptic, however a complete [name] stay could not be done due to aggressive behavior. He has had an increase in ER visits recently, with events witnessed suggestive of non-epileptic events. He is reporting increased stress recently. I again discussed PNES with him, and how it is important to see [facility] to manage bipolar disorder, to hopefully help with these events. Oxcarbazepine will be increased to 300mg  BID for mood stabilization, NOT for epilepsy (I do  not think he has epilepsy, but full [name] could not be performed to definitively determine this). He does not drive. He will follow-up in 1 year and knows to call our office for any changes.   Thank you for allowing me to participate in his care.  Please do not hesitate to call for any questions or concerns.  The duration of this appointment visit was 25 minutes of face-to-face time with the patient.  Greater than 50% of this time was spent in counseling, explanation of diagnosis, planning of further management, and coordination of care.   Ellouise Newer, M.D.  CC: Dr. Jarold Song

## 2016-10-13 NOTE — Patient Instructions (Signed)
Non-Epileptic Seizures, Adult A seizure can cause:  Involuntary movements, like falling or shaking.  Changes in awareness or consciousness.  Convulsions. These are episodes of uncontrollable, jerking movement caused by sudden, intense tightening (contraction) of the muscles. Epileptic seizures are caused by abnormal electrical activity in the brain. Non-epileptic seizures are different. They are not caused by abnormal electrical signals in your brain. These seizures may look like epileptic seizures, but they are not caused by epilepsy. There are two types of non-epileptic seizures:  Physiologic non-epileptic seizure. This type results from an underlying problem that causes a disruption in your brain's electrical activity.  Psychogenic non-epileptic seizure. This type results from emotional stress. These seizures are sometimes called pseudoseizures. What are the causes? Causes of physiologic non-epileptic seizures can include:  Sudden drop in blood pressure.  Low blood sugar (glucose).  Low levels of salt (sodium) in your blood.  Low levels of calcium in your blood.  Migraine.  Heart rhythm problems.  Sleep disorders, such as narcolepsy.  Movement disorders, such as Tourette syndrome.  Infection.  Certain medicines.  Drug and alcohol abuse.  Fever. Common causes of psychogenic non-epileptic seizures can include:  Stress.  Emotional trauma.  Sexual or physical abuse.  Major life events, such as divorce or death of a loved one.  Mental health disorders, including anxiety and depression. What are the signs or symptoms? Symptoms of a non-epileptic seizure can be similar to those of an epileptic seizure, which may include:  A change in attention or behavior (altered mental status).  Loss of consciousness or fainting.  Convulsions with rhythmic jerking movements.  Drooling.  Rapid eye movements.  Grunting.  Loss of bladder control and bowel  control.  Bitter taste in the mouth.  Tongue biting. Some people experience unusual sensations (aura) before having a seizure. These can include:  "Butterflies" in the stomach.  Abnormal smells or tastes.  A feeling of having had a new experience before (dj vu). After a non-epileptic seizure, you may have a headache or sore muscles or feel confused and sleepy. Non-epileptic seizures usually:  Do not cause physical injuries.  Start slowly.  Include crying or shrieking.  Last longer than 2 minutes.  Include pelvic thrusting. How is this diagnosed? Non-epileptic seizures may be diagnosed by:  Your medical history.  A physical exam.  Your symptoms.  Your health care provider may want to talk with your friends or relatives who have seen you have a seizure.  If possible, it is helpful if you write down your seizure activity, including what led up to the seizure, and share that information with your health care provider. You may also need to have tests to look for causes of physiologic non-epileptic seizures. These may include:  An electroencephalogram (EEG). This test measures electrical activity in your brain. If you have had a non-epileptic seizure, the results of your EEG will likely be normal.  Video EEG. This test takes place in the hospital over the course of 2-7 days. The test uses a video camera and an EEG to monitor your symptoms and the electrical activity in your brain.  Blood tests.  Lumbar puncture. This test involves pulling fluid from your spine to check for infection.  Electrocardiogram (ECG or EKG). This test checks for an abnormal heart rhythm.  CT scan. If your health care provider thinks you have had a psychogenic non-epileptic seizure, you may need to see a mental health specialist for an evaluation. How is this treated? The treatment for your  seizures will depend on what is causing them. When the underlying condition is treated, your seizures should  stop. If your seizures are being caused by emotional trauma or stress, your health care provider may recommend that you see a mental health professional. Treatment may include:  Relaxation therapy or cognitive behavioral therapy.  Medicines to treat depression or anxiety.  Individual or family counseling. In some cases, you may have psychogenic seizures in addition to epileptic seizures. If this is the case, you may be prescribed medicine to help with the epileptic seizures. Follow these instructions at home: Home care will depend on the type of non-epileptic seizures that you have. In general:  Follow all instructions from your health care provider. These may include ways to prevent seizures and what to do if you have a seizure.  Take over-the-counter and prescription medicines only as told by your health care provider.  Keep all follow-up visits as told by your health care provider. This is important.  Make sure family members, friends, and coworkers are trained on how to help you if you have a seizure. If you have a seizure, they should:  Lay you on the ground to prevent a fall.  Place a pillow or piece of clothing under your head.  Loosen any clothing around your neck.  Turn you onto your side. If vomiting occurs, this helps keep your airway clear.  Avoid any substances that may prevent your medicine from working properly. If you are prescribed medicine for seizures:  Do not use recreational drugs.  Limit or avoid alcoholic beverages. Contact a health care provider if:  Your seizures change or become more frequent.  You continue to have seizures after treatment. Get help right away if:  You injure yourself during a seizure.  You have one seizure after another.  You have trouble recovering from a seizure.  You have chest pain or trouble breathing.  You have a seizure that lasts longer than 5 minutes. Summary  Non-epileptic seizures may look like epileptic  seizures, but they are not caused by epilepsy.  The treatment for your seizures will depend on what is causing them. When the underlying condition is treated, your seizures should stop.  Make sure family members, friends, and coworkers are trained on how to help you if you have a seizure. If you have a seizure, they should lay you on the ground to prevent a fall, protect your head and neck, and turn you onto your side. This information is not intended to replace advice given to you by your health care provider. Make sure you discuss any questions you have with your health care provider. Document Released: 07/02/2005 Document Revised: 02/27/2016 Document Reviewed: 02/27/2016 Elsevier Interactive Patient Education  2017 [employer].

## 2016-10-13 NOTE — Telephone Encounter (Signed)
Met with the patient when he was in the clinic today for his appointment. He said that he is planning to move into an apartment in [name] 10/29/16.  He stated that he receives disability  - approximately $700/month.   Provided him with the resource booklets for free meals and food pantries in [place].   Reminded him that he can have his prescriptions filled at [facility] and if he is not able to afford his copays , he can put the charges on an account to pay off when he has the money.   He said that he is aware that he can call medicaid to register for transportation to medical appointments but he needs to be residing in [facility]. Also provided him with the contact # for Christa See, LCSW/[facility]

## 2016-10-13 NOTE — Patient Instructions (Signed)
1. Increase oxcarbazepine to 300mg  twice a day. With your current bottle of oxcarbazepine 150mg , take 2 tablets twice a day. After this bottle is done, your new prescription will be for oxcarbazepine 300mg , take 1 tablet twice a day 2. Call [facility] to schedule an appointment, it is very important you go back to them so these stress seizures can quiet down 3. Follow-up in 1 year

## 2016-10-13 NOTE — Progress Notes (Signed)
Subjective:  Patient ID: Franklin [name], male    DOB: 20-May-1981  Age: 36 y.o. MRN: [id number]  CC: ED follow-up  HPI Franklin [name] is a 36 year old male with a history of nonepileptic spells, bipolar disorder, mild cognitive impairment who comes into the clinic for follow-up from an ED visit on 5/14 where he was managed for seizure-like activity associated with unresponsiveness. Prior to that he had been seen on 5/11 and 10/10/16 for same and CT head was negative for acute intracranial abnormality; labs were unrevealing.  He informs me today that "the medication ain't working". He stresses that he has been compliant with his medication and informs me that last seizure was yesterday. This was witnessed by his girlfriend who described it as staring into space with postictal confusion. The patient presents alone to this appointment.  He has an upcoming appointment with neurology - Dr Delice Lesch later today.  Past Medical History:  Diagnosis Date  . Bipolar 1 disorder ([place])   . Chronic headaches   . 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 Depression   . Hx of electroencephalogram 12/2014   normal  . Mild cognitive impairment   . Schizophrenic disorder ([place])   . Seizures ([place])     No past surgical history on file.  Allergies  Allergen Reactions  . Coconut Oil Anaphylaxis and Nausea And Vomiting    Reaction to any kind of coconut  . Other Anaphylaxis    raisin  . Codeine Nausea And Vomiting  . Depakote [Divalproex Sodium] Other (See Comments)    Pt states that this medication makes him feel like he is out of it.   Marland Kitchen Penicillins Nausea And Vomiting    Has patient had a PCN reaction causing immediate rash, facial/tongue/throat swelling, SOB or lightheadedness with hypotension: No Has patient had a PCN reaction causing severe rash involving mucus membranes or skin necrosis: No Has patient had a PCN reaction  that required hospitalization No Has patient had a PCN reaction occurring within the last 10 years: No If all of the above answers are "NO", then may proceed with Cephalosporin use.    . Tape Other (See Comments)    Reaction:  Tears skin, Pt is able to use paper tape.       Outpatient Medications Prior to Visit  Medication Sig Dispense Refill  . OXcarbazepine (TRILEPTAL) 150 MG tablet Take 1 tablet (150 mg total) by mouth 2 (two) times daily. 60 tablet 0  . acetaminophen (TYLENOL) 500 MG tablet Take 1,000 mg by mouth every 6 (six) hours as needed for mild pain or headache.     . ibuprofen (ADVIL,MOTRIN) 200 MG tablet Take 400 mg by mouth every 6 (six) hours as needed for moderate pain.     Marland Kitchen OXcarbazepine (TRILEPTAL) 150 MG tablet Take 1 tablet (150 mg total) by mouth 2 (two) times daily. 4 tablet 0   No facility-administered medications prior to visit.     ROS Review of Systems  Constitutional: Negative for activity change and appetite change.  HENT: Negative for sinus pressure and sore throat.   Eyes: Negative for visual disturbance.  Respiratory: Negative for cough, chest tightness and shortness of breath.   Cardiovascular: Negative for chest pain and leg swelling.  Gastrointestinal: Negative for abdominal distention, abdominal pain, constipation and diarrhea.  Endocrine: Negative.   Genitourinary: Negative for dysuria.  Musculoskeletal: Negative for joint swelling and myalgias.  Skin: Negative  for rash.  Allergic/Immunologic: Negative.   Neurological: Positive for seizures. Negative for weakness, light-headedness and numbness.  Psychiatric/Behavioral: Negative for dysphoric mood and suicidal ideas.    Objective:  BP 117/67 (BP Location: Right Arm, Patient Position: Sitting, Cuff Size: Normal)   Temp 98 F (36.7 C) (Oral)   Resp 16   Ht 6' (1.829 m)   Wt 156 lb (70.8 kg)   SpO2 100%   BMI 21.16 kg/m   BP/Weight 10/13/2016 10/11/2016 1/77/9390  Systolic BP 300 923 300   Diastolic BP 67 83 66  Wt. (Lbs) 156 - -  BMI 21.16 - -      Physical Exam  Constitutional: He is oriented to person, place, and time. He appears well-developed and well-nourished.  Cardiovascular: Normal rate, normal heart sounds and intact distal pulses.   No murmur heard. Pulmonary/Chest: Effort normal and breath sounds normal. He has no wheezes. He has no rales. He exhibits no tenderness.  Abdominal: Soft. Bowel sounds are normal. He exhibits no distension and no mass. There is no tenderness.  Musculoskeletal: Normal range of motion.  Neurological: He is alert and oriented to person, place, and time.  Skin: Skin is warm and dry.  Psychiatric: He has a normal mood and affect.     Assessment & Plan:   1. Seizure disorder ([place]) Uncontrolled Continue Trileptal Keep appointment with neurology for later this afternoon  2. Bipolar disorder, in partial remission, most recent episode depressed ([place]) Taking a mood stabilizer Not under Psych care   No orders of the defined types were placed in this encounter.   Follow-up: Return in about 3 months (around 01/13/2017) for follow up on chronic medical conditions.   Arnoldo Morale MD

## 2016-10-19 ENCOUNTER — Emergency Department (HOSPITAL_COMMUNITY)
Admission: EM | Admit: 2016-10-19 | Discharge: 2016-10-20 | Disposition: A | Discharge status: Home / Self Care | Payer: Medicaid Other | Attending: Emergency Medicine | Admitting: Emergency Medicine

## 2016-10-19 ENCOUNTER — Encounter (HOSPITAL_COMMUNITY): Payer: Self-pay

## 2016-10-19 DIAGNOSIS — Z79899 Other long term (current) drug therapy: Secondary | ICD-10-CM | POA: Insufficient documentation

## 2016-10-19 DIAGNOSIS — K8020 Calculus of gallbladder without cholecystitis without obstruction: Secondary | ICD-10-CM | POA: Diagnosis not present

## 2016-10-19 DIAGNOSIS — R109 Unspecified abdominal pain: Secondary | ICD-10-CM | POA: Diagnosis present

## 2016-10-19 DIAGNOSIS — R1084 Generalized abdominal pain: Secondary | ICD-10-CM

## 2016-10-19 LAB — RAPID URINE DRUG SCREEN, HOSP PERFORMED
Amphetamines: NOT DETECTED
BARBITURATES: NOT DETECTED
BENZODIAZEPINES: NOT DETECTED
COCAINE: NOT DETECTED
Opiates: NOT DETECTED
TETRAHYDROCANNABINOL: NOT DETECTED

## 2016-10-19 LAB — URINALYSIS, ROUTINE W REFLEX MICROSCOPIC
Bilirubin Urine: NEGATIVE
GLUCOSE, UA: NEGATIVE mg/dL
HGB URINE DIPSTICK: NEGATIVE
Ketones, ur: 5 mg/dL — AB
Leukocytes, UA: NEGATIVE
NITRITE: NEGATIVE
Protein, ur: NEGATIVE mg/dL
SPECIFIC GRAVITY, URINE: 1.019 (ref 1.005–1.030)
pH: 6 (ref 5.0–8.0)

## 2016-10-19 LAB — COMPREHENSIVE METABOLIC PANEL
ALT: 19 U/L (ref 17–63)
ANION GAP: 10 (ref 5–15)
AST: 24 U/L (ref 15–41)
Albumin: 5 g/dL (ref 3.5–5.0)
Alkaline Phosphatase: 66 U/L (ref 38–126)
BUN: 13 mg/dL (ref 6–20)
CO2: 25 mmol/L (ref 22–32)
Calcium: 9.8 mg/dL (ref 8.9–10.3)
Chloride: 103 mmol/L (ref 101–111)
Creatinine, Ser: 1.02 mg/dL (ref 0.61–1.24)
GFR calc Af Amer: >60 mL/min (ref >60)
Glucose, Bld: 95 mg/dL (ref 65–99)
POTASSIUM: 3.7 mmol/L (ref 3.5–5.1)
Sodium: 138 mmol/L (ref 135–145)
Total Bilirubin: 2 mg/dL — ABNORMAL HIGH (ref 0.3–1.2)
Total Protein: 7.6 g/dL (ref 6.5–8.1)

## 2016-10-19 LAB — CBC
HEMATOCRIT: 44 % (ref 39.0–52.0)
HEMOGLOBIN: 15.4 g/dL (ref 13.0–17.0)
MCH: 29.8 pg (ref 26.0–34.0)
MCHC: 35 g/dL (ref 30.0–36.0)
MCV: 85.3 fL (ref 78.0–100.0)
Platelets: 170 10*3/uL (ref 150–400)
RBC: 5.16 MIL/uL (ref 4.22–5.81)
RDW: 12.4 % (ref 11.5–15.5)
WBC: 6.8 10*3/uL (ref 4.0–10.5)

## 2016-10-19 LAB — CK: CK TOTAL: 84 U/L (ref 49–397)

## 2016-10-19 LAB — ETHANOL: Alcohol, Ethyl (B): <5 mg/dL (ref <5)

## 2016-10-19 LAB — LIPASE, BLOOD: LIPASE: 27 U/L (ref 11–51)

## 2016-10-19 MED ORDER — ONDANSETRON 4 MG PO TBDP
4.0000 mg | ORAL_TABLET | Freq: Once | ORAL | Status: AC | PRN
  Administered 2016-10-19: 4 mg via ORAL
  Filled 2016-10-19: qty 1

## 2016-10-19 MED ORDER — SODIUM CHLORIDE 0.9 % IV BOLUS (SEPSIS)
1000.0000 mL | Freq: Once | INTRAVENOUS | Status: AC
  Administered 2016-10-19: 1000 mL via INTRAVENOUS

## 2016-10-19 NOTE — ED Triage Notes (Signed)
.  1 Pt was brought in by EMS from. PT girlfriend call stating pt was having increased sleepiness and somnolent today. Pt was not being verbal to family and EMS, and pt sister repost that when pt becomes this way he is having a maniac  bipolar episode. Pt then began telling EMS that he has been having Abdominal pain described as cramping, needles that began to day with nausea no vomiting episodes are noted. Pt also has not taken SZ or Bipolar medications today  BP 130/86 Hr 68 RR 16, CGB 108, Temp. 98.1

## 2016-10-19 NOTE — ED Notes (Signed)
Pt is alert and oriented x 4 and is verbally responsive. Pt is c/o abd pain 10/10. Pt states pain began today and has associated nausea.

## 2016-10-19 NOTE — ED Provider Notes (Signed)
[place] DEPT Provider Note   CSN: [id number] Arrival date & time: 10/19/16  2035  By signing my name below, I, Margit Banda, attest that this documentation has been prepared under the direction and in the presence of Montine Circle, PA-C.  Electronically Signed: Margit Banda, ED Scribe. 10/19/16. 10:36 PM.  History   Chief Complaint Chief Complaint  Patient presents with  . Abdominal Pain    HPI Comments: Franklin [name] is a 36 y.o. male with a PMHx of seizures who presents to the Emergency Department with a chief complaint of abdominal cramping that started earlier today. Per pt's girlfriend he slept all day yesterday and today. He took benadryl this morning, after waking up with rhinorrhea. Associated sx include weakness, fatigue, and decreased appetite. Girlfriend thought he was going to have a seizure yesterday evening so she gave him his medication. She also noticed he was shaking today and she was unsure if it was because he was cold or about to have a seizure. His last BM was yesterday. Pt denies nausea, vomiting, diarrhea, constipation, dysuria, fever and chills.  The history is provided by the patient and a significant other. No language interpreter was used.    Past Medical History:  Diagnosis Date  . Bipolar 1 disorder ([place])   . Chronic headaches   . 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 Depression   . Hx of electroencephalogram 12/2014   normal  . Mild cognitive impairment   . Schizophrenic disorder ([place])   . Seizures [facility])     Patient Active Problem List   Diagnosis Date Noted  . 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 Abnormal EKG 11/30/2015  . Elevated troponin 11/30/2015  . Bipolar affective disorder, most recent episode unspecified type, remission status unspecified 03/11/2015  . Bipolar affective disorder ([place]) 01/08/2015  . Generalized  idiopathic epilepsy and epileptic syndromes, without status epilepticus, not intractable ([place]) 11/27/2014  . Seizure disorder ([place]) 10/14/2014    History reviewed. No pertinent surgical history.     Home Medications    Prior to Admission medications   Medication Sig Start Date End Date Taking? Authorizing Provider  acetaminophen (TYLENOL) 500 MG tablet Take 1,000 mg by mouth every 6 (six) hours as needed for mild pain or headache.     [provider]  ibuprofen (ADVIL,MOTRIN) 200 MG tablet Take 400 mg by mouth every 6 (six) hours as needed for moderate pain.     [provider]  Oxcarbazepine (TRILEPTAL) 300 MG tablet Take 1 tablet (300 mg total) by mouth 2 (two) times daily. Patient not taking: Reported on 10/19/2016 10/13/16   Cameron Sprang, MD    Family History Family History  Problem Relation Age of Onset  . Heart disease Mother     Social History Social History  Substance Use Topics  . Smoking status: Never Smoker  . Smokeless tobacco: Never Used  . Alcohol use No     Allergies   Coconut oil; Other; Codeine; Depakote [divalproex sodium]; Penicillins; and Tape   Review of Systems Review of Systems  Constitutional: Positive for fatigue. Negative for chills and fever.  Gastrointestinal: Positive for abdominal pain. Negative for constipation, diarrhea, nausea and vomiting.  Genitourinary: Negative for dysuria.  Neurological: Positive for weakness.  All other systems reviewed and are negative.    Physical Exam Updated Vital Signs BP 111/65 (BP Location: Right Arm)   Pulse 78  Temp 99.6 F (37.6 C) (Oral)   Resp 18   Ht 6' (1.829 m)   Wt 156 lb (70.8 kg)   SpO2 98%   BMI 21.16 kg/m   Physical Exam  Constitutional: He is oriented to person, place, and time. He appears well-developed and well-nourished.  HENT:  Head: Normocephalic.  Eyes: EOM are normal.  Neck: Normal range of motion.  Cardiovascular: Normal rate, regular rhythm,  normal heart sounds and intact distal pulses.  Exam reveals no gallop and no friction rub.   No murmur heard. Pulmonary/Chest: Effort normal and breath sounds normal. No respiratory distress. He has no wheezes.  Abdominal: He exhibits no distension. There is tenderness.  Musculoskeletal: Normal range of motion.  Neurological: He is alert and oriented to person, place, and time.  Psychiatric: He has a normal mood and affect.  Nursing note and vitals reviewed.    ED Treatments / Results  DIAGNOSTIC STUDIES: Oxygen Saturation is 100% on RA, normal by my interpretation.   COORDINATION OF CARE: 10:22 PM-Discussed next steps with pt. Pt verbalized understanding and is agreeable with the plan.    Labs (all labs ordered are listed, but only abnormal results are displayed) Labs Reviewed  LIPASE, BLOOD  COMPREHENSIVE METABOLIC PANEL  CBC  URINALYSIS, ROUTINE W REFLEX MICROSCOPIC  CK  ETHANOL  RAPID URINE DRUG SCREEN, HOSP PERFORMED    EKG  EKG Interpretation None       Radiology Ct Abdomen Pelvis W Contrast  Result Date: 10/20/2016 CLINICAL DATA:  Initial evaluation for acute abdominal pain. EXAM: CT ABDOMEN AND PELVIS WITH CONTRAST TECHNIQUE: Multidetector CT imaging of the abdomen and pelvis was performed using the standard protocol following bolus administration of intravenous contrast. CONTRAST:  100 cc of Isovue-300. COMPARISON:  Prior radiograph from 12/21/2014. FINDINGS: Lower chest: Visualized lung bases are clear. Hepatobiliary: Liver demonstrates a normal contrast enhanced appearance. Calcified stone noted within the gallbladder lumen. No imaging findings to suggest acute cholecystitis. No biliary dilatation. Pancreas: Pancreas within normal limits. Spleen: Spleen within normal limits. Adrenals/Urinary Tract: Adrenal glands are normal. Kidneys equal in size with symmetric enhancement. No nephrolithiasis, hydronephrosis, or focal enhancing renal mass. No hydroureter. Bladder  within normal limits. Stomach/Bowel: Stomach unremarkable. No evidence for bowel obstruction. Appendix within normal limits. No acute inflammatory changes about the bowels. Vascular/Lymphatic: Normal intravascular enhancement seen throughout the intra-abdominal aorta and its branch vessels. No adenopathy. Reproductive: Prostate normal. Other: No free air or fluid. Musculoskeletal: No acute osseus abnormality. No worrisome lytic or blastic osseous lesions. IMPRESSION: 1. No CT evidence for acute intra-abdominal or pelvic process. 2. Cholelithiasis. Electronically Signed   By: Jeannine Boga M.D.   On: 10/20/2016 01:22    Procedures Procedures (including critical care time)  Medications Ordered in ED Medications  ondansetron (ZOFRAN-ODT) disintegrating tablet 4 mg (not administered)  sodium chloride 0.9 % bolus 1,000 mL (not administered)     Initial Impression / Assessment and Plan / ED Course  I have reviewed the triage vital signs and the nursing notes.  Pertinent labs & imaging results that were available during my care of the patient were reviewed by me and considered in my medical decision making (see chart for details).     Patient with vague abdominal pain. Also with witnessed shaking episode, but the patient was alert and oriented, doubt seizure. Laboratory workup is reassuring. Patient had some discomfort on abdominal exam, will give fluids and Zofran and reassess.  Vital signs remain stable. The patient does not appear  to be in any apparent distress, but does report persistent pain in his abdomen. Will check CT.  CT shows cholelithiasis but without evidence of cholecystitis. He is afebrile. He does not have any elevated LFTs. Will recommend outpatient follow-up with general surgery. Patient understands agrees the plan. He is stable for discharge.  Final Clinical Impressions(s) / ED Diagnoses   Final diagnoses:  Generalized abdominal pain  Calculus of gallbladder without  cholecystitis without obstruction    New Prescriptions New Prescriptions   HYDROCODONE-ACETAMINOPHEN (NORCO/VICODIN) 5-325 MG TABLET    Take 1-2 tablets by mouth every 6 (six) hours as needed.   I personally performed the services described in this documentation, which was scribed in my presence. The recorded information has been reviewed and is accurate.       Montine Circle, PA-C 10/20/16 0207    Fatima Blank, MD 10/22/16 1600

## 2016-10-20 ENCOUNTER — Other Ambulatory Visit: Payer: Self-pay

## 2016-10-20 ENCOUNTER — Emergency Department (HOSPITAL_COMMUNITY): Payer: Medicaid Other

## 2016-10-20 ENCOUNTER — Encounter (HOSPITAL_COMMUNITY): Payer: Self-pay

## 2016-10-20 ENCOUNTER — Telehealth: Payer: Self-pay | Admitting: Neurology

## 2016-10-20 MED ORDER — IOPAMIDOL (ISOVUE-300) INJECTION 61%
INTRAVENOUS | Status: AC
  Filled 2016-10-20: qty 100

## 2016-10-20 MED ORDER — HYDROCODONE-ACETAMINOPHEN 5-325 MG PO TABS: 1.0000 | ORAL_TABLET | Freq: Four times a day (QID) | ORAL | 0 refills | Status: DC | PRN

## 2016-10-20 MED ORDER — OXCARBAZEPINE 150 MG PO TABS: 150.0000 mg | ORAL_TABLET | Freq: Two times a day (BID) | ORAL | 3 refills | Status: DC

## 2016-10-20 MED ORDER — IOPAMIDOL (ISOVUE-300) INJECTION 61%
100.0000 mL | Freq: Once | INTRAVENOUS | Status: AC | PRN
Start: 2016-10-20 — End: 2016-10-20
  Administered 2016-10-20: 100 mL via INTRAVENOUS

## 2016-10-20 NOTE — ED Notes (Signed)
Pt offered laxative by MD refused and stated that she has medications at home that she can take.

## 2016-10-20 NOTE — ED Notes (Signed)
[facility] called, awaiting transport home.

## 2016-10-20 NOTE — Telephone Encounter (Signed)
PT called and said he was having pain with his medication that he's taking and said he was in the ER and they advised him to get in touch with Dr Delice Lesch

## 2016-10-20 NOTE — Telephone Encounter (Signed)
He does not need to follow-up with me. Pls have him reduce the Trileptal back to 150mg  twice a day. This was prescribed for his bipolar disorder, but if he is having problems with the medication, he should now definitely see his psychiatrist at [facility] for other medication for his bipolar disorder. Thanks

## 2016-10-20 NOTE — Telephone Encounter (Signed)
Called pt and relayed message below.  Sent Rx for Trileptal 150mg  BID to [facility].  He states that he called [facility] this morning to follow up with them

## 2016-10-23 ENCOUNTER — Emergency Department (HOSPITAL_COMMUNITY): Payer: Medicaid Other

## 2016-10-23 ENCOUNTER — Emergency Department (HOSPITAL_COMMUNITY)
Admission: EM | Admit: 2016-10-23 | Discharge: 2016-10-23 | Disposition: A | Discharge status: Home / Self Care | Payer: Medicaid Other | Attending: Emergency Medicine | Admitting: Emergency Medicine

## 2016-10-23 ENCOUNTER — Encounter (HOSPITAL_COMMUNITY): Payer: Self-pay | Admitting: *Deleted

## 2016-10-23 DIAGNOSIS — R569 Unspecified convulsions: Secondary | ICD-10-CM | POA: Diagnosis present

## 2016-10-23 DIAGNOSIS — G40909 Epilepsy, unspecified, not intractable, without status epilepticus: Secondary | ICD-10-CM | POA: Insufficient documentation

## 2016-10-23 HISTORY — DX: Conversion disorder with seizures or convulsions: F44.5

## 2016-10-23 LAB — CBC WITH DIFFERENTIAL/PLATELET
Basophils Absolute: 0 10*3/uL (ref 0.0–0.1)
Basophils Relative: 0 %
Eosinophils Absolute: 0.1 10*3/uL (ref 0.0–0.7)
Eosinophils Relative: 1 %
HEMATOCRIT: 42.7 % (ref 39.0–52.0)
HEMOGLOBIN: 14.8 g/dL (ref 13.0–17.0)
LYMPHS ABS: 1.9 10*3/uL (ref 0.7–4.0)
Lymphocytes Relative: 36 %
MCH: 30 pg (ref 26.0–34.0)
MCHC: 34.7 g/dL (ref 30.0–36.0)
MCV: 86.4 fL (ref 78.0–100.0)
Monocytes Absolute: 0.5 10*3/uL (ref 0.1–1.0)
Monocytes Relative: 9 %
NEUTROS PCT: 54 %
Neutro Abs: 2.9 10*3/uL (ref 1.7–7.7)
Platelets: 175 10*3/uL (ref 150–400)
RBC: 4.94 MIL/uL (ref 4.22–5.81)
RDW: 12.5 % (ref 11.5–15.5)
WBC: 5.4 10*3/uL (ref 4.0–10.5)

## 2016-10-23 LAB — BASIC METABOLIC PANEL
Anion gap: 11 (ref 5–15)
BUN: 10 mg/dL (ref 6–20)
CO2: 25 mmol/L (ref 22–32)
Calcium: 9.7 mg/dL (ref 8.9–10.3)
Chloride: 107 mmol/L (ref 101–111)
Creatinine, Ser: 0.86 mg/dL (ref 0.61–1.24)
Glucose, Bld: 98 mg/dL (ref 65–99)
Potassium: 3.6 mmol/L (ref 3.5–5.1)
SODIUM: 143 mmol/L (ref 135–145)

## 2016-10-23 LAB — CBG MONITORING, ED: Glucose-Capillary: 91 mg/dL (ref 65–99)

## 2016-10-23 MED ORDER — LORAZEPAM 2 MG/ML IJ SOLN
1.0000 mg | Freq: Once | INTRAMUSCULAR | Status: AC
  Administered 2016-10-23: 1 mg via INTRAVENOUS
  Filled 2016-10-23: qty 1

## 2016-10-23 MED ORDER — LORAZEPAM 1 MG PO TABS: 1.0000 mg | ORAL_TABLET | Freq: Two times a day (BID) | ORAL | 0 refills | Status: DC | PRN

## 2016-10-23 MED ORDER — SODIUM CHLORIDE 0.9 % IV BOLUS (SEPSIS)
1000.0000 mL | Freq: Once | INTRAVENOUS | Status: AC
  Administered 2016-10-23: 1000 mL via INTRAVENOUS

## 2016-10-23 MED ORDER — HYDROCODONE-HOMATROPINE 5-1.5 MG/5ML PO SYRP
5.0000 mL | ORAL_SOLUTION | Freq: Once | ORAL | Status: AC
  Administered 2016-10-23: 5 mL via ORAL
  Filled 2016-10-23: qty 5

## 2016-10-23 NOTE — ED Notes (Signed)
Pt ambulatory to waiting room. Pt verbalized understanding of discharge instructions.   

## 2016-10-23 NOTE — ED Provider Notes (Signed)
[place] DEPT Provider Note   CSN: [id number] Arrival date & time: 10/23/16  2048  By signing my name below, I, Franklin [name], attest that this documentation has been prepared under the direction and in the presence of Franklin Christen, MD. Electronically Signed: Oleh [name], Scribe. 10/23/16. 9:23 PM.   History   Chief Complaint Chief Complaint  Patient presents with  . Seizures    HPI Franklin [name] is a 36 y.o. male with history of bipolarism and schizophrenia on Trileptal who presents to the ED following a seizure. The patient states that he had a seizure with shaking in all four extremities 1 hour ago. He also had a similar seizure 24 hours ago. Both witnessed by his wife. He is taking 75mg  of Trileptal once daily by neurology recommendations. When asked, the wife states that he gets them "every couple weeks" based on "his stress levels". She states that the patient has returned to his baseline mental status since his seizure. He is otherwise reporting a mild dry cough. No head pain. No loss of sensation or function distally. He denies any particular injuries.  The history is provided by the patient. No language interpreter was used.  Seizures   This is a new problem. The current episode started 1 to 2 hours ago. There was 1 seizure. Associated symptoms include cough. Pertinent negatives include no visual disturbance, no sore throat, no chest pain and no vomiting. Characteristics include rhythmic jerking. The episode was witnessed. The seizures did not continue in the ED.    Past Medical History:  Diagnosis Date  . Bipolar 1 disorder ([place])   . Chronic headaches   . Conversion disorder with seizures or convulsions   . 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 Depression   . Hx of electroencephalogram 12/2014   normal  . Mild cognitive impairment   . Psychogenic nonepileptic seizure   .  Schizophrenic disorder ([place])   . Seizures [facility])     Patient Active Problem List   Diagnosis Date Noted  . 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 Abnormal EKG 11/30/2015  . Elevated troponin 11/30/2015  . Bipolar affective disorder, most recent episode unspecified type, remission status unspecified 03/11/2015  . Bipolar affective disorder ([place]) 01/08/2015  . Generalized idiopathic epilepsy and epileptic syndromes, without status epilepticus, not intractable ([place]) 11/27/2014  . Seizure disorder ([place]) 10/14/2014    History reviewed. No pertinent surgical history.     Home Medications    Prior to Admission medications   Medication Sig Start Date End Date Taking? Authorizing Provider  acetaminophen (TYLENOL) 500 MG tablet Take 1,000 mg by mouth every 6 (six) hours as needed for mild pain or headache.    Yes [provider]  HYDROcodone-acetaminophen (NORCO/VICODIN) 5-325 MG tablet Take 1-2 tablets by mouth every 6 (six) hours as needed. 10/20/16  Yes Montine Circle, PA-C  ibuprofen (ADVIL,MOTRIN) 200 MG tablet Take 400 mg by mouth every 6 (six) hours as needed for moderate pain.    Yes [provider]  OXcarbazepine (TRILEPTAL) 150 MG tablet Take 1 tablet (150 mg total) by mouth 2 (two) times daily. Patient taking differently: Take 75 mg by mouth 2 (two) times daily.  10/20/16  Yes Cameron Sprang, MD  LORazepam (ATIVAN) 1 MG tablet Take 1 tablet (1 mg total) by mouth 2 (two) times daily as needed for anxiety. 10/23/16   Franklin Christen, MD  Family History Family History  Problem Relation Age of Onset  . Heart disease Mother     Social History Social History  Substance Use Topics  . Smoking status: Never Smoker  . Smokeless tobacco: Never Used  . Alcohol use No     Allergies   Coconut oil; Other; Codeine; Depakote [divalproex sodium]; Penicillins; and Tape   Review of Systems Review of Systems  Constitutional: Negative for chills  and fever.  HENT: Negative for ear pain and sore throat.   Eyes: Negative for pain and visual disturbance.  Respiratory: Positive for cough. Negative for shortness of breath.   Cardiovascular: Negative for chest pain and palpitations.  Gastrointestinal: Negative for abdominal pain and vomiting.  Genitourinary: Negative for dysuria and hematuria.  Musculoskeletal: Negative for arthralgias and back pain.  Skin: Negative for color change and rash.  Neurological: Positive for seizures.  All other systems reviewed and are negative.    Physical Exam Updated Vital Signs BP 125/90 (BP Location: Left Arm)   Pulse (!) 57   Temp 98.9 F (37.2 C) (Oral)   Resp 18   Ht 6' (1.829 m)   Wt 70.8 kg (156 lb)   SpO2 97%   BMI 21.16 kg/m   Physical Exam  Constitutional: He is oriented to person, place, and time. He appears well-developed and well-nourished.  HENT:  Head: Normocephalic and atraumatic.  Active dry cough.  Eyes: Conjunctivae are normal.  Neck: Neck supple.  Cardiovascular: Normal rate and regular rhythm.   Pulmonary/Chest: Effort normal and breath sounds normal.  Abdominal: Soft. Bowel sounds are normal.  Musculoskeletal: Normal range of motion.  Neurological: He is alert and oriented to person, place, and time.  Skin: Skin is warm and dry.  Psychiatric: He has a normal mood and affect. His behavior is normal.  Nursing note and vitals reviewed.    ED Treatments / Results  COORDINATION OF CARE: 9:15 PM Discussed plan to obtain basic lab work and chest x-ray. The patient is agreeable with this plan.  Labs (all labs ordered are listed, but only abnormal results are displayed) Labs Reviewed  CBC WITH DIFFERENTIAL/PLATELET  BASIC METABOLIC PANEL  CBG MONITORING, ED    EKG  EKG Interpretation None       Radiology Dg Chest 2 View  Result Date: 10/23/2016 CLINICAL DATA:  Cough, seizure. EXAM: CHEST  2 VIEW COMPARISON:  Chest x-rays dated 05/12/2016 and 04/11/2015.  FINDINGS: Heart size and mediastinal contours are within normal limits. Lungs are clear. No pleural effusion or pneumothorax seen. Osseous structures about the chest are unremarkable. IMPRESSION: No active cardiopulmonary disease. No evidence of pneumonia or pulmonary edema. Electronically Signed   By: Franki Cabot M.D.   On: 10/23/2016 22:27    Procedures Procedures (including critical care time)  Medications Ordered in ED Medications  sodium chloride 0.9 % bolus 1,000 mL (0 mLs Intravenous Stopped 10/23/16 2235)  LORazepam (ATIVAN) injection 1 mg (1 mg Intravenous Given 10/23/16 2124)  HYDROcodone-homatropine (HYCODAN) 5-1.5 MG/5ML syrup 5 mL (5 mLs Oral Given 10/23/16 2239)     Initial Impression / Assessment and Plan / ED Course  I have reviewed the triage vital signs and the nursing notes.  Pertinent labs & imaging results that were available during my care of the patient were reviewed by me and considered in my medical decision making (see chart for details).     Patient is hemodynamically stable. No evidence of a seizure in the emergency department. Screening labs were acceptable. He  was rechecked several times with no evidence of neurological deficits. Discharge medication Ativan 1 mg [#15]  Final Clinical Impressions(s) / ED Diagnoses   Final diagnoses:  Seizure ([place])    New Prescriptions New Prescriptions   LORAZEPAM (ATIVAN) 1 MG TABLET    Take 1 tablet (1 mg total) by mouth 2 (two) times daily as needed for anxiety.  I personally performed the services described in this documentation, which was scribed in my presence. The recorded information has been reviewed and is accurate.     Franklin Christen, MD 10/23/16 [phone]

## 2016-10-23 NOTE — ED Triage Notes (Signed)
Pt brought in by [facility] for c/o seizures; pt states his dr decreased his dose of medicine because all he did was sleep; ems reports pt had a seizure on scene and became a little combative right after the seizure; pt is alert and oriented at this time

## 2016-10-23 NOTE — Discharge Instructions (Signed)
Tests showed no life-threatening conditio.  Take over-the-counter cough syrup. Prescription for restlessness or anxiety.

## 2016-10-27 ENCOUNTER — Encounter: Payer: Self-pay | Admitting: Family Medicine

## 2016-10-27 ENCOUNTER — Ambulatory Visit: Payer: Medicaid Other | Attending: Family Medicine | Admitting: Family Medicine

## 2016-10-27 VITALS — BP 96/56 | HR 56 | Temp 97.7°F | Wt 154.8 lb

## 2016-10-27 DIAGNOSIS — R0982 Postnasal drip: Secondary | ICD-10-CM | POA: Insufficient documentation

## 2016-10-27 DIAGNOSIS — Z79899 Other long term (current) drug therapy: Secondary | ICD-10-CM | POA: Diagnosis not present

## 2016-10-27 DIAGNOSIS — Z880 Allergy status to penicillin: Secondary | ICD-10-CM | POA: Insufficient documentation

## 2016-10-27 DIAGNOSIS — R05 Cough: Secondary | ICD-10-CM | POA: Diagnosis not present

## 2016-10-27 DIAGNOSIS — F319 Bipolar disorder, unspecified: Secondary | ICD-10-CM | POA: Diagnosis not present

## 2016-10-27 DIAGNOSIS — F445 Conversion disorder with seizures or convulsions: Secondary | ICD-10-CM | POA: Diagnosis not present

## 2016-10-27 DIAGNOSIS — J3489 Other specified disorders of nose and nasal sinuses: Secondary | ICD-10-CM | POA: Diagnosis not present

## 2016-10-27 DIAGNOSIS — F3175 Bipolar disorder, in partial remission, most recent episode depressed: Secondary | ICD-10-CM

## 2016-10-27 MED ORDER — CETIRIZINE HCL 10 MG PO TABS: 10.0000 mg | ORAL_TABLET | Freq: Every day | ORAL | 1 refills | Status: DC

## 2016-10-27 MED ORDER — FLUTICASONE PROPIONATE 50 MCG/ACT NA SUSP: 1.0000 | Freq: Every day | NASAL | 1 refills | Status: DC

## 2016-10-27 NOTE — Progress Notes (Signed)
Subjective:  Patient ID: Franklin [name], male    DOB: 1980-08-10  Age: 36 y.o. MRN: [id number]  CC: Cough   HPI Franklin [name] s a 36 year old male with a history of nonepileptic spells, bipolar disorder, mild cognitive impairmentWho presents today complaining of a two-week history of dry cough associated with nasal stuffiness, rhinorrhea, postnasal drip. He denies sinus pressure, headaches or fevers. States he has used "everything over-the-counter" with no relief in symptoms. He goes on to name Robitussin, TheraFlu, NyQuil.  He recently had an ED visit for seizures 4 days ago and was also seen by his neurologist 2 weeks ago for same at which time he had been advised to follow-up with [facility] for management of his bipolar disorder. He should remain on Trileptal 150 mg twice daily however he has been taking 75 mg twice daily instead  Past Medical History:  Diagnosis Date  . Bipolar 1 disorder ([place])   . Chronic headaches   . Conversion disorder with seizures or convulsions   . 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 Depression   . Hx of electroencephalogram 12/2014   normal  . Mild cognitive impairment   . Psychogenic nonepileptic seizure   . Schizophrenic disorder ([place])   . Seizures ([place])     No past surgical history on file.  Allergies  Allergen Reactions  . Coconut Oil Anaphylaxis and Nausea And Vomiting    Reaction to any kind of coconut  . Other Anaphylaxis    raisin  . Codeine Nausea And Vomiting  . Depakote [Divalproex Sodium] Other (See Comments)    Pt states that this medication makes him feel like he is out of it.   Marland Kitchen Penicillins Nausea And Vomiting    Has patient had a PCN reaction causing immediate rash, facial/tongue/throat swelling, SOB or lightheadedness with hypotension: No Has patient had a PCN reaction causing severe rash involving mucus membranes or skin necrosis: No Has patient  had a PCN reaction that required hospitalization No Has patient had a PCN reaction occurring within the last 10 years: No If all of the above answers are "NO", then may proceed with Cephalosporin use.    . Tape Other (See Comments)    Reaction:  Tears skin, Pt is able to use paper tape.       Outpatient Medications Prior to Visit  Medication Sig Dispense Refill  . acetaminophen (TYLENOL) 500 MG tablet Take 1,000 mg by mouth every 6 (six) hours as needed for mild pain or headache.     Marland Kitchen HYDROcodone-acetaminophen (NORCO/VICODIN) 5-325 MG tablet Take 1-2 tablets by mouth every 6 (six) hours as needed. 6 tablet 0  . OXcarbazepine (TRILEPTAL) 150 MG tablet Take 1 tablet (150 mg total) by mouth 2 (two) times daily. (Patient taking differently: Take 75 mg by mouth 2 (two) times daily. ) 180 tablet 3  . ibuprofen (ADVIL,MOTRIN) 200 MG tablet Take 400 mg by mouth every 6 (six) hours as needed for moderate pain.     Marland Kitchen LORazepam (ATIVAN) 1 MG tablet Take 1 tablet (1 mg total) by mouth 2 (two) times daily as needed for anxiety. (Patient not taking: Reported on 10/27/2016) 15 tablet 0   No facility-administered medications prior to visit.     ROS Review of Systems  Constitutional: Negative for activity change and appetite change.  HENT: Positive for congestion and postnasal drip. Negative for sinus pressure and sore throat.   Eyes:  Negative for visual disturbance.  Respiratory: Positive for cough. Negative for chest tightness and shortness of breath.   Cardiovascular: Negative for chest pain and leg swelling.  Gastrointestinal: Negative for abdominal distention, abdominal pain, constipation and diarrhea.  Endocrine: Negative.   Genitourinary: Negative for dysuria.  Musculoskeletal: Negative for joint swelling and myalgias.  Skin: Negative for rash.  Allergic/Immunologic: Negative.   Neurological: Positive for seizures. Negative for weakness, light-headedness and numbness.    Psychiatric/Behavioral: Negative for dysphoric mood and suicidal ideas.    Objective:  BP (!) 96/56   Pulse (!) 56   Temp 97.7 F (36.5 C) (Oral)   Wt 154 lb 12.8 oz (70.2 kg)   SpO2 98%   BMI 20.99 kg/m   BP/Weight 10/27/2016 10/23/2016 0/78/6754  Systolic BP 96 492 010  Diastolic BP 56 81 83  Wt. (Lbs) 154.8 156 -  BMI 20.99 21.16 -      Physical Exam  Constitutional: He is oriented to person, place, and time. He appears well-developed and well-nourished.  HENT:  Right Ear: External ear normal.  Left Ear: External ear normal.  Postnasal drip in oropharynx with a lot of mucus  Cardiovascular: Normal rate, normal heart sounds and intact distal pulses.   No murmur heard. Pulmonary/Chest: Effort normal and breath sounds normal. He has no wheezes. He has no rales. He exhibits no tenderness.  Abdominal: Soft. Bowel sounds are normal. He exhibits no distension and no mass. There is no tenderness.  Musculoskeletal: Normal range of motion.  Neurological: He is alert and oriented to person, place, and time.     Assessment & Plan:   1. Conversion disorder with seizures or convulsions Educated on correct dose of Trileptal-150 mg twice daily  2. Bipolar disorder, in partial remission, most recent episode depressed [facility]) Patient states he has been to [facility] for initial assessment and will be going back for follow-up visit  3. Postnasal drip - cetirizine (ZYRTEC) 10 MG tablet; Take 1 tablet (10 mg total) by mouth daily.  Dispense: 30 tablet; Refill: 1 - fluticasone (FLONASE) 50 MCG/ACT nasal spray; Place 1 spray into both nostrils daily.  Dispense: 16 g; Refill: 1   Meds ordered this encounter  Medications  . cetirizine (ZYRTEC) 10 MG tablet    Sig: Take 1 tablet (10 mg total) by mouth daily.    Dispense:  30 tablet    Refill:  1  . fluticasone (FLONASE) 50 MCG/ACT nasal spray    Sig: Place 1 spray into both nostrils daily.    Dispense:  16 g    Refill:  1     Follow-up: Return in about 6 months (around 04/29/2017) for Coordination of care.   This note has been created with Surveyor, quantity. Any transcriptional errors are unintentional.     Arnoldo Morale MD

## 2016-11-04 ENCOUNTER — Ambulatory Visit: Payer: Self-pay | Admitting: Neurology

## 2016-11-17 ENCOUNTER — Ambulatory Visit: Payer: Self-pay | Admitting: Neurology

## 2016-11-30 ENCOUNTER — Encounter (HOSPITAL_COMMUNITY): Payer: Self-pay

## 2016-11-30 ENCOUNTER — Emergency Department (HOSPITAL_COMMUNITY)
Admission: EM | Admit: 2016-11-30 | Discharge: 2016-12-01 | Disposition: A | Discharge status: Home / Self Care | Payer: Medicaid Other | Attending: Emergency Medicine | Admitting: Emergency Medicine

## 2016-11-30 DIAGNOSIS — Z79899 Other long term (current) drug therapy: Secondary | ICD-10-CM | POA: Diagnosis not present

## 2016-11-30 DIAGNOSIS — F410 Panic disorder [episodic paroxysmal anxiety] without agoraphobia: Secondary | ICD-10-CM | POA: Insufficient documentation

## 2016-11-30 NOTE — ED Notes (Signed)
Pt called EMS because he was having a panic attack after arguing with his landlord [employer] were on scene because his girlfriend is about to get him kicked out

## 2016-11-30 NOTE — ED Notes (Signed)
Bed: WTR5 Expected date:  Expected time:  Means of arrival:  Comments: 

## 2016-12-01 MED ORDER — HYDROXYZINE HCL 25 MG PO TABS
25.0000 mg | ORAL_TABLET | Freq: Once | ORAL | Status: AC
  Administered 2016-12-01: 25 mg via ORAL
  Filled 2016-12-01: qty 1

## 2016-12-01 MED ORDER — HYDROXYZINE HCL 25 MG PO TABS: 25.0000 mg | ORAL_TABLET | Freq: Four times a day (QID) | ORAL | 0 refills | Status: DC

## 2016-12-01 NOTE — ED Provider Notes (Signed)
[facility] DEPT Provider Note   CSN: [id number] Arrival date & time: 11/30/16  2259     History   Chief Complaint Chief Complaint  Patient presents with  . Anxiety    HPI Franklin [name] is a 36 y.o. male with PMH of mental illness. He is here for anxiety attack he is having housing issues and under significant stress. He denies cp/ sob. He denies AVH/SI/HI.  HPI  Past Medical History:  Diagnosis Date  . Bipolar 1 disorder ([place])   . Chronic headaches   . Conversion disorder with seizures or convulsions   . 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 Depression   . Hx of electroencephalogram 12/2014   normal  . Mild cognitive impairment   . Psychogenic nonepileptic seizure   . Schizophrenic disorder ([place])   . Seizures [facility])     Patient Active Problem List   Diagnosis Date Noted  . 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 Abnormal EKG 11/30/2015  . Elevated troponin 11/30/2015  . Bipolar affective disorder, most recent episode unspecified type, remission status unspecified 03/11/2015  . Bipolar affective disorder ([place]) 01/08/2015  . Generalized idiopathic epilepsy and epileptic syndromes, without status epilepticus, not intractable ([place]) 11/27/2014  . Seizure disorder ([place]) 10/14/2014    History reviewed. No pertinent surgical history.     Home Medications    Prior to Admission medications   Medication Sig Start Date End Date Taking? Authorizing Provider  acetaminophen (TYLENOL) 500 MG tablet Take 1,000 mg by mouth every 6 (six) hours as needed for mild pain or headache.     [provider]  cetirizine (ZYRTEC) 10 MG tablet Take 1 tablet (10 mg total) by mouth daily. 10/27/16   Arnoldo Morale, MD  fluticasone (FLONASE) 50 MCG/ACT nasal spray Place 1 spray into both nostrils daily. 10/27/16   Arnoldo Morale, MD  HYDROcodone-acetaminophen (NORCO/VICODIN) 5-325  MG tablet Take 1-2 tablets by mouth every 6 (six) hours as needed. 10/20/16   Montine Circle, PA-C  hydrOXYzine (ATARAX/VISTARIL) 25 MG tablet Take 1 tablet (25 mg total) by mouth every 6 (six) hours. 12/01/16   Margarita Mail, PA-C  ibuprofen (ADVIL,MOTRIN) 200 MG tablet Take 400 mg by mouth every 6 (six) hours as needed for moderate pain.     [provider]  LORazepam (ATIVAN) 1 MG tablet Take 1 tablet (1 mg total) by mouth 2 (two) times daily as needed for anxiety. Patient not taking: Reported on 10/27/2016 10/23/16   [name], MD  OXcarbazepine (TRILEPTAL) 150 MG tablet Take 1 tablet (150 mg total) by mouth 2 (two) times daily. Patient taking differently: Take 75 mg by mouth 2 (two) times daily.  10/20/16   Cameron Sprang, MD    Family History Family History  Problem Relation Age of Onset  . Heart disease Mother     Social History Social History  Substance Use Topics  . Smoking status: Never Smoker  . Smokeless tobacco: Never Used  . Alcohol use No     Allergies   Coconut oil; Other; Codeine; Depakote [divalproex sodium]; Penicillins; and Tape   Review of Systems Review of Systems Ten systems reviewed and are negative for acute change, except as noted in the HPI.    Physical Exam Updated Vital Signs BP 135/81 (BP Location: Right Arm)   Pulse 73   Temp 98.4 F (36.9 C) (Oral)   Resp 16   SpO2  96%   Physical Exam  Constitutional: He appears well-developed and well-nourished. No distress.  HENT:  Head: Normocephalic and atraumatic.  Eyes: Conjunctivae are normal. No scleral icterus.  Neck: Normal range of motion. Neck supple.  Cardiovascular: Normal rate, regular rhythm and normal heart sounds.   Pulmonary/Chest: Effort normal and breath sounds normal. No respiratory distress.  Abdominal: Soft. There is no tenderness.  Musculoskeletal: He exhibits no edema.  Neurological: He is alert.  Skin: Skin is warm and dry. He is not diaphoretic.  Psychiatric:    anxious  Nursing note and vitals reviewed.    ED Treatments / Results  Labs (all labs ordered are listed, but only abnormal results are displayed) Labs Reviewed - No data to display  EKG  EKG Interpretation None       Radiology No results found.  Procedures Procedures (including critical care time)  Medications Ordered in ED Medications  hydrOXYzine (ATARAX/VISTARIL) tablet 25 mg (not administered)     Initial Impression / Assessment and Plan / ED Course  I have reviewed the triage vital signs and the nursing notes.  Pertinent labs & imaging results that were available during my care of the patient were reviewed by me and considered in my medical decision making (see chart for details).      Patient with anxiety attack. Appears situational. Given vistaril. Discussed return precautions.  Final Clinical Impressions(s) / ED Diagnoses   Final diagnoses:  Anxiety attack    New Prescriptions New Prescriptions   HYDROXYZINE (ATARAX/VISTARIL) 25 MG TABLET    Take 1 tablet (25 mg total) by mouth every 6 (six) hours.     Margarita Mail, PA-C 12/01/16 0032    Forde Dandy, MD 12/01/16 [phone]

## 2016-12-01 NOTE — Discharge Instructions (Signed)
Get help right away if: You experience panic attack symptoms that are different than your usual symptoms. You have serious thoughts about hurting yourself or others. You are taking medicine for panic attacks and have a serious side effect.

## 2016-12-03 ENCOUNTER — Emergency Department (HOSPITAL_COMMUNITY)
Admission: EM | Admit: 2016-12-03 | Discharge: 2016-12-03 | Disposition: A | Discharge status: Home / Self Care | Payer: Medicaid Other | Attending: Emergency Medicine | Admitting: Emergency Medicine

## 2016-12-03 ENCOUNTER — Emergency Department (HOSPITAL_COMMUNITY): Payer: Medicaid Other

## 2016-12-03 ENCOUNTER — Encounter (HOSPITAL_COMMUNITY): Payer: Self-pay

## 2016-12-03 DIAGNOSIS — R112 Nausea with vomiting, unspecified: Secondary | ICD-10-CM | POA: Insufficient documentation

## 2016-12-03 DIAGNOSIS — R1084 Generalized abdominal pain: Secondary | ICD-10-CM | POA: Diagnosis present

## 2016-12-03 LAB — COMPREHENSIVE METABOLIC PANEL
ALK PHOS: 65 U/L (ref 38–126)
ALT: 21 U/L (ref 17–63)
ANION GAP: 4 — AB (ref 5–15)
AST: 23 U/L (ref 15–41)
Albumin: 4.6 g/dL (ref 3.5–5.0)
BUN: 13 mg/dL (ref 6–20)
CALCIUM: 9.4 mg/dL (ref 8.9–10.3)
CO2: 27 mmol/L (ref 22–32)
CREATININE: 0.89 mg/dL (ref 0.61–1.24)
Chloride: 108 mmol/L (ref 101–111)
Glucose, Bld: 83 mg/dL (ref 65–99)
Potassium: 3.7 mmol/L (ref 3.5–5.1)
Sodium: 139 mmol/L (ref 135–145)
TOTAL PROTEIN: 7.9 g/dL (ref 6.5–8.1)
Total Bilirubin: 1.1 mg/dL (ref 0.3–1.2)

## 2016-12-03 LAB — CBC
HCT: 41.3 % (ref 39.0–52.0)
Hemoglobin: 14.6 g/dL (ref 13.0–17.0)
MCH: 29.9 pg (ref 26.0–34.0)
MCHC: 35.4 g/dL (ref 30.0–36.0)
MCV: 84.5 fL (ref 78.0–100.0)
Platelets: 169 10*3/uL (ref 150–400)
RBC: 4.89 MIL/uL (ref 4.22–5.81)
RDW: 12.6 % (ref 11.5–15.5)
WBC: 9.9 10*3/uL (ref 4.0–10.5)

## 2016-12-03 LAB — URINALYSIS, ROUTINE W REFLEX MICROSCOPIC
Bilirubin Urine: NEGATIVE
Glucose, UA: NEGATIVE mg/dL
HGB URINE DIPSTICK: NEGATIVE
Ketones, ur: NEGATIVE mg/dL
LEUKOCYTES UA: NEGATIVE
NITRITE: NEGATIVE
PROTEIN: NEGATIVE mg/dL
Specific Gravity, Urine: 1.019 (ref 1.005–1.030)
pH: 7 (ref 5.0–8.0)

## 2016-12-03 LAB — LIPASE, BLOOD: Lipase: 24 U/L (ref 11–51)

## 2016-12-03 MED ORDER — DICYCLOMINE HCL 20 MG PO TABS: 20.0000 mg | ORAL_TABLET | Freq: Two times a day (BID) | ORAL | 0 refills | Status: DC

## 2016-12-03 MED ORDER — IOPAMIDOL (ISOVUE-300) INJECTION 61%
100.0000 mL | Freq: Once | INTRAVENOUS | Status: AC | PRN
  Administered 2016-12-03: 100 mL via INTRAVENOUS

## 2016-12-03 MED ORDER — ONDANSETRON HCL 4 MG/2ML IJ SOLN: 4.0000 mg | Freq: Once | INTRAMUSCULAR | Status: DC

## 2016-12-03 MED ORDER — IOPAMIDOL (ISOVUE-300) INJECTION 61%
INTRAVENOUS | Status: AC
  Filled 2016-12-03: qty 100

## 2016-12-03 MED ORDER — ONDANSETRON 4 MG PO TBDP: 4.0000 mg | ORAL_TABLET | Freq: Three times a day (TID) | ORAL | 0 refills | Status: DC | PRN

## 2016-12-03 NOTE — Discharge Instructions (Addendum)
You have been seen today for abdominal pain and vomiting. There were no acute abnormalities on the lab work or imaging.   Your symptoms may be consistent with a viral illness. Viruses do not require antibiotics. Treatment is symptomatic care and it is important to note that these symptoms may last for 7-14 days.   Hand washing: Wash your hands throughout the day, but especially before and after touching the face, using the restroom, sneezing, coughing, or touching surfaces that have been coughed or sneezed upon. Hydration: Symptoms will be intensified and complicated by dehydration. Dehydration can also extend the duration of symptoms. Drink plenty of fluids and get plenty of rest. You should be drinking at least half a liter of water an hour to stay hydrated. Electrolyte drinks are also encouraged. You should be drinking enough fluids to make your urine light yellow, almost clear. If this is not the case, you are not drinking enough water. Please note that some of the treatments indicated below will not be effective if you are not adequately hydrated. Pain: Ibuprofen, Naproxen, or Tylenol for pain or fever.  Bentyl: May use the bentyl as needed for abdominal discomfort. Nausea/vomiting: Use the Zofran for nausea or vomiting.  Follow up: Follow up with a primary care provider, as needed, for any future management of this issue. Return: Return to ED should symptoms worsen.

## 2016-12-03 NOTE — ED Triage Notes (Signed)
Per EMS, patient was on the bus and got off at the bus stop due to abdominal pain and called EMS. C/o 8/10 pain; would not let EMS palpate his abdomen. Reported history of gallstones.

## 2016-12-03 NOTE — ED Notes (Signed)
Pt tolerated oral intact well, denies nausea and vomiting at this time.

## 2016-12-03 NOTE — ED Provider Notes (Signed)
[facility] DEPT Provider Note   CSN: [id number] Arrival date & time: 12/03/16  1323     History   Chief Complaint Chief Complaint  Patient presents with  . Abdominal Pain    HPI Franklin [name] is a 36 y.o. male.  HPI   Franklin [name] is a 36 y.o. male, with a history of Bipolar, conversion disorder, and schizophrenia, presenting to the ED with abdominal pain for the last week. Pain is central abdomen, "feels like a hammer beating me," radiates "all over the belly," rated 9/10. Pain is intermittent.  Endorses nausea and vomiting with one episode of emesis over last 24 hours. Last BM was yesterday and normal. Last food was around 1130AM this morning. Eating seems to improve the pain. Has not experienced this before. Has not taken any medications for this issue. Denies sick contacts.   Denies fever/chills, diarrhea/constipation, urinary complaints, hematochezia/melena, hematemesis, or any other complaints.     Past Medical History:  Diagnosis Date  . Bipolar 1 disorder ([place])   . Chronic headaches   . Conversion disorder with seizures or convulsions   . 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 Depression   . Hx of electroencephalogram 12/2014   normal  . Mild cognitive impairment   . Psychogenic nonepileptic seizure   . Schizophrenic disorder ([place])   . Seizures [facility])     Patient Active Problem List   Diagnosis Date Noted  . 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 Abnormal EKG 11/30/2015  . Elevated troponin 11/30/2015  . Bipolar affective disorder, most recent episode unspecified type, remission status unspecified 03/11/2015  . Bipolar affective disorder ([place]) 01/08/2015  . Generalized idiopathic epilepsy and epileptic syndromes, without status epilepticus, not intractable ([place]) 11/27/2014  . Seizure disorder ([place]) 10/14/2014    History reviewed. No pertinent  surgical history.     Home Medications    Prior to Admission medications   Medication Sig Start Date End Date Taking? Authorizing Provider  fluticasone (FLONASE) 50 MCG/ACT nasal spray Place 1 spray into both nostrils daily. 10/27/16  Yes Arnoldo Morale, MD  HYDROcodone-acetaminophen (NORCO/VICODIN) 5-325 MG tablet Take 1-2 tablets by mouth every 6 (six) hours as needed. 10/20/16  Yes Montine Circle, PA-C  OXcarbazepine (TRILEPTAL) 150 MG tablet Take 1 tablet (150 mg total) by mouth 2 (two) times daily. Patient taking differently: Take 75 mg by mouth 2 (two) times daily.  10/20/16  Yes Cameron Sprang, MD  dicyclomine (BENTYL) 20 MG tablet Take 1 tablet (20 mg total) by mouth 2 (two) times daily. 12/03/16   Nobie Alleyne C, PA-C  hydrOXYzine (ATARAX/VISTARIL) 25 MG tablet Take 1 tablet (25 mg total) by mouth every 6 (six) hours. 12/01/16   Margarita Mail, PA-C  ibuprofen (ADVIL,MOTRIN) 200 MG tablet Take 400 mg by mouth every 6 (six) hours as needed for moderate pain.     [provider]  ondansetron (ZOFRAN ODT) 4 MG disintegrating tablet Take 1 tablet (4 mg total) by mouth every 8 (eight) hours as needed for nausea or vomiting. 12/03/16   Nickole Adamek, Helane Gunther, PA-C    Family History Family History  Problem Relation Age of Onset  . Heart disease Mother     Social History Social History  Substance Use Topics  . Smoking status: Never Smoker  . Smokeless tobacco: Never Used  . Alcohol use 0.0 oz/week     Comment: occasionally  Allergies   Coconut oil; Other; Codeine; Depakote [divalproex sodium]; Penicillins; and Tape   Review of Systems Review of Systems  Constitutional: Negative for appetite change, chills, diaphoresis and fever.  Respiratory: Negative for shortness of breath.   Cardiovascular: Negative for chest pain.  Gastrointestinal: Positive for abdominal pain, nausea and vomiting. Negative for blood in stool, constipation and diarrhea.  Genitourinary: Negative for  dysuria, flank pain and frequency.  Musculoskeletal: Negative for back pain.  All other systems reviewed and are negative.    Physical Exam Updated Vital Signs BP 122/82   Pulse 86   Temp 98.2 F (36.8 C) (Oral)   Resp 12   Ht 6' (1.829 m)   SpO2 96%   Physical Exam  Constitutional: He appears well-developed and well-nourished. No distress.  HENT:  Head: Normocephalic and atraumatic.  Eyes: Conjunctivae are normal.  Neck: Neck supple.  Cardiovascular: Normal rate, regular rhythm, normal heart sounds and intact distal pulses.   Pulmonary/Chest: Effort normal and breath sounds normal. No respiratory distress.  Abdominal: Soft. Bowel sounds are increased. There is generalized tenderness and tenderness in the periumbilical area. There is no guarding and no CVA tenderness.  Musculoskeletal: He exhibits no edema.  Lymphadenopathy:    He has no cervical adenopathy.  Neurological: He is alert.  Skin: Skin is warm and dry. He is not diaphoretic.  Psychiatric: He has a normal mood and affect. His behavior is normal.  Nursing note and vitals reviewed.    ED Treatments / Results  Labs (all labs ordered are listed, but only abnormal results are displayed) Labs Reviewed  COMPREHENSIVE METABOLIC PANEL - Abnormal; Notable for the following:       Result Value   Anion gap 4 (*)    All other components within normal limits  LIPASE, BLOOD  CBC  URINALYSIS, ROUTINE W REFLEX MICROSCOPIC    EKG  EKG Interpretation None       Radiology Ct Abdomen Pelvis W Contrast  Result Date: 12/03/2016 CLINICAL DATA:  Severe abdominal pain and tenderness beginning today. Cholelithiasis. EXAM: CT ABDOMEN AND PELVIS WITH CONTRAST TECHNIQUE: Multidetector CT imaging of the abdomen and pelvis was performed using the standard protocol following bolus administration of intravenous contrast. CONTRAST:  100 mL ISOVUE-300 IOPAMIDOL (ISOVUE-300) INJECTION 61% COMPARISON:  10/20/2016 FINDINGS: Lower Chest:  No acute findings. Hepatobiliary: No masses identified. Small calcified gallstone again noted, however there is no evidence of cholecystitis or biliary dilatation. Pancreas:  No mass or inflammatory changes. Spleen: Within normal limits in size and appearance. Adrenals/Urinary Tract: No masses identified. No evidence of hydronephrosis. Stomach/Bowel: No evidence of obstruction, inflammatory process or abnormal fluid collections. Normal appendix visualized. Vascular/Lymphatic: No pathologically enlarged lymph nodes. No abdominal aortic aneurysm. Reproductive:  No mass or other significant abnormality. Other:  None. Musculoskeletal:  No suspicious bone lesions identified. IMPRESSION: Cholelithiasis. No radiographic evidence of cholecystitis or other acute findings. Electronically Signed   By: Earle [name] M.D.   On: 12/03/2016 16:17    Procedures Procedures (including critical care time)  Medications Ordered in ED Medications  iopamidol (ISOVUE-300) 61 % injection 100 mL (100 mLs Intravenous Contrast Given 12/03/16 1559)     Initial Impression / Assessment and Plan / ED Course  I have reviewed the triage vital signs and the nursing notes.  Pertinent labs & imaging results that were available during my care of the patient were reviewed by me and considered in my medical decision making (see chart for details).  Clinical Course as of  Dec 04 2330  Fri Dec 03, 2016  1524 Declined offer for pain medication.  [SJ]    Clinical Course User Index [SJ] Keisuke Hollabaugh C, PA-C    Patient presents with abdominal pain and vomiting. Lab results encouraging. CT ordered due to exam findings. Patient is nontoxic appearing, afebrile, not tachycardic, not tachypneic, not hypotensive, maintains adequate SPO2 on room air, and is in no apparent distress. CT free of acute abnormality. PCP follow-up. The patient was given instructions for home care as well as return precautions. Patient voices understanding of these  instructions, accepts the plan, and is comfortable with discharge.  Vitals:   12/03/16 1332 12/03/16 1334 12/03/16 1335 12/03/16 1621  BP:  114/73 122/82 119/70  Pulse:  75 86 (!) 55  Resp:  12  14  Temp:  98.2 F (36.8 C)    TempSrc:  Oral    SpO2: 96% 100% 96% 100%  Height:  6' (1.829 m)       Final Clinical Impressions(s) / ED Diagnoses   Final diagnoses:  Generalized abdominal pain  Non-intractable vomiting with nausea, unspecified vomiting type    New Prescriptions Discharge Medication List as of 12/03/2016  4:56 PM    START taking these medications   Details  dicyclomine (BENTYL) 20 MG tablet Take 1 tablet (20 mg total) by mouth 2 (two) times daily., Starting Fri 12/03/2016, Print    ondansetron (ZOFRAN ODT) 4 MG disintegrating tablet Take 1 tablet (4 mg total) by mouth every 8 (eight) hours as needed for nausea or vomiting., Starting Fri 12/03/2016, Print         Neida Ellegood, [place], PA-C 12/03/16 2333    Orlie Dakin, MD 12/08/16 [phone]

## 2016-12-24 ENCOUNTER — Telehealth: Payer: Self-pay | Admitting: Family Medicine

## 2016-12-24 NOTE — Telephone Encounter (Signed)
Please advise on this form.

## 2016-12-24 NOTE — Telephone Encounter (Signed)
Patient call requesting a form [id number] he said that the Dr has the form and he needed before his appt 8/8 . Please, call him at  [phone] . Thank You

## 2016-12-27 NOTE — Telephone Encounter (Signed)
I have not come across such a form - reviewed my paperwork today.

## 2017-01-01 ENCOUNTER — Encounter (HOSPITAL_COMMUNITY): Payer: Self-pay | Admitting: Emergency Medicine

## 2017-01-01 ENCOUNTER — Emergency Department (HOSPITAL_COMMUNITY)
Admission: EM | Admit: 2017-01-01 | Discharge: 2017-01-01 | Disposition: A | Discharge status: Home / Self Care | Payer: Medicaid Other | Attending: Emergency Medicine | Admitting: Emergency Medicine

## 2017-01-01 DIAGNOSIS — F10229 Alcohol dependence with intoxication, unspecified: Secondary | ICD-10-CM | POA: Diagnosis not present

## 2017-01-01 DIAGNOSIS — F419 Anxiety disorder, unspecified: Secondary | ICD-10-CM | POA: Insufficient documentation

## 2017-01-01 DIAGNOSIS — Z79899 Other long term (current) drug therapy: Secondary | ICD-10-CM | POA: Diagnosis not present

## 2017-01-01 DIAGNOSIS — R569 Unspecified convulsions: Secondary | ICD-10-CM | POA: Insufficient documentation

## 2017-01-01 LAB — CBC
HEMATOCRIT: 41.6 % (ref 39.0–52.0)
Hemoglobin: 14 g/dL (ref 13.0–17.0)
MCH: 28.9 pg (ref 26.0–34.0)
MCHC: 33.7 g/dL (ref 30.0–36.0)
MCV: 86 fL (ref 78.0–100.0)
PLATELETS: 151 10*3/uL (ref 150–400)
RBC: 4.84 MIL/uL (ref 4.22–5.81)
RDW: 12.5 % (ref 11.5–15.5)
WBC: 5.4 10*3/uL (ref 4.0–10.5)

## 2017-01-01 LAB — BASIC METABOLIC PANEL
Anion gap: 7 (ref 5–15)
BUN: 10 mg/dL (ref 6–20)
CALCIUM: 9.5 mg/dL (ref 8.9–10.3)
CO2: 26 mmol/L (ref 22–32)
Chloride: 105 mmol/L (ref 101–111)
Creatinine, Ser: 0.99 mg/dL (ref 0.61–1.24)
GLUCOSE: 75 mg/dL (ref 65–99)
POTASSIUM: 4.2 mmol/L (ref 3.5–5.1)
Sodium: 138 mmol/L (ref 135–145)

## 2017-01-01 LAB — CBG MONITORING, ED
GLUCOSE-CAPILLARY: 154 mg/dL — AB (ref 65–99)
Glucose-Capillary: 115 mg/dL — ABNORMAL HIGH (ref 65–99)

## 2017-01-01 NOTE — ED Provider Notes (Signed)
[facility] DEPT Provider Note   CSN: [id number] Arrival date & time: 01/01/17  1949     History   Chief Complaint Chief Complaint  Patient presents with  . Seizures  . Alcohol Intoxication  . Anxiety    HPI Franklin [name] is a 36 y.o. male.  The history is provided by the patient.  Seizures   This is a recurrent problem. The current episode started 1 to 2 hours ago. The problem has been resolved. There were 2 to 3 seizures. The most recent episode lasted more than 5 minutes. Pertinent negatives include no speech difficulty, no sore throat, no cough, no nausea, no vomiting, no diarrhea and no muscle weakness. Staring ahead, and extremity tremoring/jerking The episode was witnessed. There was no sensation of an aura present. The seizures did not continue in the ED. The seizure(s) had no focality. There has been no fever. There were no medications administered prior to arrival.    36 year old male who presents with seizure-like activity. He has a history of psychogenic nonepileptic seizures, bipolar disorder, anxiety, and depression. He presents with seizure-like activity, but does not remember the incident. It was witnessed by his wife who states that his seizure-like activity was prompted by somebody saying something that was very stressful to him. States that normally his seizures are related to stress. States that he started staring forward and having jerking activity. Initial episode lasted 10 minutes but then had her second episode that lasted for 5 minutes. He denies any recent illnesses including fevers, cough, nausea or vomiting.   Past Medical History:  Diagnosis Date  . Bipolar 1 disorder ([place])   . Chronic headaches   . Conversion disorder with seizures or convulsions   . 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 Depression   . Hx of electroencephalogram 12/2014   normal  . Mild cognitive  impairment   . Psychogenic nonepileptic seizure   . Schizophrenic disorder ([place])   . Seizures [facility])     Patient Active Problem List   Diagnosis Date Noted  . 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 Abnormal EKG 11/30/2015  . Elevated troponin 11/30/2015  . Bipolar affective disorder, most recent episode unspecified type, remission status unspecified 03/11/2015  . Bipolar affective disorder ([place]) 01/08/2015  . Generalized idiopathic epilepsy and epileptic syndromes, without status epilepticus, not intractable ([place]) 11/27/2014  . Seizure disorder ([place]) 10/14/2014    History reviewed. No pertinent surgical history.     Home Medications    Prior to Admission medications   Medication Sig Start Date End Date Taking? Authorizing Provider  dicyclomine (BENTYL) 20 MG tablet Take 1 tablet (20 mg total) by mouth 2 (two) times daily. 12/03/16   Joy, Shawn C, PA-C  fluticasone (FLONASE) 50 MCG/ACT nasal spray Place 1 spray into both nostrils daily. 10/27/16   Arnoldo Morale, MD  HYDROcodone-acetaminophen (NORCO/VICODIN) 5-325 MG tablet Take 1-2 tablets by mouth every 6 (six) hours as needed. 10/20/16   Montine Circle, PA-C  hydrOXYzine (ATARAX/VISTARIL) 25 MG tablet Take 1 tablet (25 mg total) by mouth every 6 (six) hours. 12/01/16   Margarita Mail, PA-C  ibuprofen (ADVIL,MOTRIN) 200 MG tablet Take 400 mg by mouth every 6 (six) hours as needed for moderate pain.     [provider]  ondansetron (ZOFRAN ODT) 4 MG disintegrating tablet Take 1 tablet (4 mg total) by mouth every 8 (eight) hours as needed for nausea  or vomiting. 12/03/16   Joy, Shawn C, PA-C  OXcarbazepine (TRILEPTAL) 150 MG tablet Take 1 tablet (150 mg total) by mouth 2 (two) times daily. Patient taking differently: Take 75 mg by mouth 2 (two) times daily.  10/20/16   Cameron Sprang, MD    Family History Family History  Problem Relation Age of Onset  . Heart disease Mother     Social History Social  History  Substance Use Topics  . Smoking status: Never Smoker  . Smokeless tobacco: Never Used  . Alcohol use 0.0 oz/week     Comment: 40oz today     Allergies   Coconut oil; Other; Codeine; Depakote [divalproex sodium]; Penicillins; and Tape   Review of Systems Review of Systems  Constitutional: Negative for fever.  HENT: Negative for sore throat.   Respiratory: Negative for cough.   Gastrointestinal: Negative for diarrhea, nausea and vomiting.  Neurological: Positive for seizures. Negative for speech difficulty.  All other systems reviewed and are negative.    Physical Exam Updated Vital Signs BP 107/80   Pulse (!) 53   Temp 98.9 F (37.2 C) (Oral)   Resp 18   Wt 69.7 kg (153 lb 9.6 oz)   SpO2 98%   BMI 20.83 kg/m   Physical Exam Physical Exam  Nursing note and vitals reviewed. Constitutional: Well developed, well nourished, non-toxic, and in no acute distress Head: Normocephalic and atraumatic.  Mouth/Throat: Oropharynx is clear and moist.  Neck: Normal range of motion. Neck supple.  Cardiovascular: Normal rate and regular rhythm.   Pulmonary/Chest: Effort normal and breath sounds normal.  Abdominal: Soft. There is no tenderness. There is no rebound and no guarding.  Musculoskeletal: Normal range of motion.  Skin: Skin is warm and dry.  Psychiatric: Cooperative Neurological:  Alert, oriented to person, place, time, and situation. Memory grossly in tact. Fluent speech. No dysarthria or aphasia.  Cranial nerves: Pupils are symmetric, and reactive to light. EOMI without nystagmus. No gaze deviation. Facial muscles symmetric with activation. Sensation to light touch over face in tact bilaterally. Hearing grossly in tact. Palate elevates symmetrically. Head turn and shoulder shrug are intact. Tongue midline.  Reflexes defered.  Muscle bulk and tone normal. No pronator drift. Moves all extremities symmetrically. Sensation to light touch is in tact throughout in  bilateral upper and lower extremities. Coordination reveals no dysmetria with finger to nose.    ED Treatments / Results  Labs (all labs ordered are listed, but only abnormal results are displayed) Labs Reviewed  CBG MONITORING, ED - Abnormal; Notable for the following:       Result Value   Glucose-Capillary 154 (*)    All other components within normal limits  CBG MONITORING, ED - Abnormal; Notable for the following:    Glucose-Capillary 115 (*)    All other components within normal limits  CBC  BASIC METABOLIC PANEL    EKG  EKG Interpretation  Date/Time:  Saturday January 01 2017 20:46:26 EDT Ventricular Rate:  55 PR Interval:    QRS Duration: 127 QT Interval:  402 QTC Calculation: 385 R Axis:   -6 Text Interpretation:  Sinus rhythm IVCD, consider atypical RBBB ST elev, probable normal early repol pattern similar to previous EKG  Confirmed by Brantley Stage [phone]) on 01/01/2017 8:52:31 PM       Radiology No results found.  Procedures Procedures (including critical care time)  Medications Ordered in ED Medications - No data to display   Initial Impression / Assessment and Plan /  ED Course  I have reviewed the triage vital signs and the nursing notes.  Pertinent labs & imaging results that were available during my care of the patient were reviewed by me and considered in my medical decision making (see chart for details).     Records reviewed, patient with previous EKG through [facility] showing nonepileptic seizures. Follows with neurologist, but taking Trileptal for mood stabilization.  On my evaluation he is alert, behaving appropriately, and at baseline mentation per his wife. just prior, nursing noted 10 second episode of right hand shaking and non-verbal. However, now back to normal.  History is suggestive of nonepileptic seizures. Blood work is reassuring. The patient to follow-up with neurologist. Be discharged with wife.  Final Clinical  Impressions(s) / ED Diagnoses   Final diagnoses:  Seizure-like activity [facility])    New Prescriptions New Prescriptions   No medications on file     Forde Dandy, MD 01/01/17 2124

## 2017-01-01 NOTE — ED Notes (Signed)
Pt encouaraged to be seen by physician, pt ambulated to room with nurse first with steady gait noted; pt remains non-verbal

## 2017-01-01 NOTE — ED Notes (Signed)
No lab draw in triage,  Pt resisting and won't sit still.  Pt walked out triage room 2 headed to waiting room. His mom is calling him to come back but pt continues to walk away.

## 2017-01-01 NOTE — ED Notes (Signed)
Dr Oleta Mouse in room.

## 2017-01-01 NOTE — Discharge Instructions (Signed)
Your blood work is reassuring. Your seizure like activity today is likely related to stress. Please continue to follow-up with your neurologist  Return for worsening symptoms, including fever, confusion, or any other symptoms concerning to you.

## 2017-01-01 NOTE — ED Notes (Signed)
Pt verbalized understanding of d/c instructions and has no further questions. VSS, NAD.  

## 2017-01-01 NOTE — ED Triage Notes (Signed)
Pt presents to ER with [facility] for seizure x 2 10 mins apart; pt reports he did not take his antiseizure medication today so he could drink ETOH, est drinking a 40oz beer; pt arrived in triage and placed into triage chair where patient began to stare off and not responsive to staff verbal responses, pt R hand began shaking, lasted 10 secs; pt now non-verbal and not following commands

## 2017-01-05 ENCOUNTER — Ambulatory Visit: Payer: Self-pay | Admitting: Family Medicine

## 2017-01-10 ENCOUNTER — Other Ambulatory Visit: Payer: Self-pay | Admitting: Family Medicine

## 2017-01-10 DIAGNOSIS — R0982 Postnasal drip: Secondary | ICD-10-CM

## 2017-01-13 ENCOUNTER — Encounter (HOSPITAL_COMMUNITY): Payer: Self-pay | Admitting: Emergency Medicine

## 2017-01-13 DIAGNOSIS — Z79899 Other long term (current) drug therapy: Secondary | ICD-10-CM | POA: Diagnosis not present

## 2017-01-13 DIAGNOSIS — R1084 Generalized abdominal pain: Secondary | ICD-10-CM | POA: Insufficient documentation

## 2017-01-13 LAB — COMPREHENSIVE METABOLIC PANEL
ALK PHOS: 70 U/L (ref 38–126)
ALT: 22 U/L (ref 17–63)
ANION GAP: 8 (ref 5–15)
AST: 27 U/L (ref 15–41)
Albumin: 4.7 g/dL (ref 3.5–5.0)
BILIRUBIN TOTAL: 1.1 mg/dL (ref 0.3–1.2)
BUN: 14 mg/dL (ref 6–20)
CO2: 27 mmol/L (ref 22–32)
Calcium: 9.6 mg/dL (ref 8.9–10.3)
Chloride: 107 mmol/L (ref 101–111)
Creatinine, Ser: 0.94 mg/dL (ref 0.61–1.24)
GFR calc non Af Amer: >60 mL/min (ref >60)
GLUCOSE: 98 mg/dL (ref 65–99)
POTASSIUM: 3.6 mmol/L (ref 3.5–5.1)
SODIUM: 142 mmol/L (ref 135–145)
TOTAL PROTEIN: 7.9 g/dL (ref 6.5–8.1)

## 2017-01-13 LAB — CBC
HEMATOCRIT: 43 % (ref 39.0–52.0)
HEMOGLOBIN: 15.4 g/dL (ref 13.0–17.0)
MCH: 30.3 pg (ref 26.0–34.0)
MCHC: 35.8 g/dL (ref 30.0–36.0)
MCV: 84.6 fL (ref 78.0–100.0)
Platelets: 209 10*3/uL (ref 150–400)
RBC: 5.08 MIL/uL (ref 4.22–5.81)
RDW: 12.3 % (ref 11.5–15.5)
WBC: 7.2 10*3/uL (ref 4.0–10.5)

## 2017-01-13 LAB — LIPASE, BLOOD: Lipase: 36 U/L (ref 11–51)

## 2017-01-13 NOTE — ED Triage Notes (Signed)
Per EMS, patient c/o abdominal pain since noon. Patient reports eating spicy foods prior. Seen for same previously and states he was told he needed his gallbladder removed. Denies N/V/D.  BP 128/82 HR 70 RR 14 CBG 113

## 2017-01-14 ENCOUNTER — Emergency Department (HOSPITAL_COMMUNITY)
Admission: EM | Admit: 2017-01-14 | Discharge: 2017-01-14 | Disposition: A | Discharge status: Home / Self Care | Payer: Medicaid Other | Attending: Emergency Medicine | Admitting: Emergency Medicine

## 2017-01-14 DIAGNOSIS — R1084 Generalized abdominal pain: Secondary | ICD-10-CM

## 2017-01-14 NOTE — ED Provider Notes (Signed)
[facility] DEPT Provider Note   CSN: [id number] Arrival date & time: 01/13/17  2241     History   Chief Complaint Chief Complaint  Patient presents with  . Abdominal Pain    HPI Franklin [name] is a 36 y.o. male.  36 yo M with a chief complaint of diffuse crampy abdominal pain. Going off and on for the past couple weeks. Denies fevers denies nausea or vomiting denies diarrhea. Denies urinary symptoms. Patient has had a few episodes like this previously and been seen in the ED for them. The times A CT scan that found cholelithiasis without cholecystitis. He has not yet seen a surgeon for possible cholecystectomy.   The history is provided by the patient.  Abdominal Pain   This is a new problem. The current episode started more than 1 week ago. The problem occurs constantly. The problem has not changed since onset.The quality of the pain is burning. The pain is at a severity of 10/10. The pain is severe. Pertinent negatives include fever, diarrhea, vomiting, headaches, arthralgias and myalgias. Nothing aggravates the symptoms. Nothing relieves the symptoms.    Past Medical History:  Diagnosis Date  . Bipolar 1 disorder ([place])   . Chronic headaches   . Conversion disorder with seizures or convulsions   . 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 Depression   . Hx of electroencephalogram 12/2014   normal  . Mild cognitive impairment   . Psychogenic nonepileptic seizure   . Schizophrenic disorder ([place])   . Seizures [facility])     Patient Active Problem List   Diagnosis Date Noted  . 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 Abnormal EKG 11/30/2015  . Elevated troponin 11/30/2015  . Bipolar affective disorder, most recent episode unspecified type, remission status unspecified 03/11/2015  . Bipolar affective disorder ([place]) 01/08/2015  . Generalized idiopathic epilepsy and epileptic  syndromes, without status epilepticus, not intractable ([place]) 11/27/2014  . Seizure disorder ([place]) 10/14/2014    History reviewed. No pertinent surgical history.     Home Medications    Prior to Admission medications   Medication Sig Start Date End Date Taking? Authorizing Provider  cetirizine (ZYRTEC) 10 MG tablet Take 1 tablet (10 mg total) by mouth daily. 01/10/17   Arnoldo Morale, MD  dicyclomine (BENTYL) 20 MG tablet Take 1 tablet (20 mg total) by mouth 2 (two) times daily. 12/03/16   Joy, Shawn C, PA-C  fluticasone (FLONASE) 50 MCG/ACT nasal spray Place 1 spray into both nostrils daily. 01/10/17   Arnoldo Morale, MD  HYDROcodone-acetaminophen (NORCO/VICODIN) 5-325 MG tablet Take 1-2 tablets by mouth every 6 (six) hours as needed. 10/20/16   Montine Circle, PA-C  hydrOXYzine (ATARAX/VISTARIL) 25 MG tablet Take 1 tablet (25 mg total) by mouth every 6 (six) hours. 12/01/16   Margarita Mail, PA-C  ibuprofen (ADVIL,MOTRIN) 200 MG tablet Take 400 mg by mouth every 6 (six) hours as needed for moderate pain.     [provider]  ondansetron (ZOFRAN ODT) 4 MG disintegrating tablet Take 1 tablet (4 mg total) by mouth every 8 (eight) hours as needed for nausea or vomiting. 12/03/16   Joy, Shawn C, PA-C  OXcarbazepine (TRILEPTAL) 150 MG tablet Take 1 tablet (150 mg total) by mouth 2 (two) times daily. Patient taking differently: Take 75 mg by mouth 2 (two) times daily.  10/20/16   Cameron Sprang, MD    Family History Family  History  Problem Relation Age of Onset  . Heart disease Mother     Social History Social History  Substance Use Topics  . Smoking status: Never Smoker  . Smokeless tobacco: Never Used  . Alcohol use 0.0 oz/week     Comment: 40oz today     Allergies   Coconut oil; Other; Codeine; Depakote [divalproex sodium]; Penicillins; and Tape   Review of Systems Review of Systems  Constitutional: Negative for chills and fever.  HENT: Negative for congestion and facial  swelling.   Eyes: Negative for discharge and visual disturbance.  Respiratory: Negative for shortness of breath.   Cardiovascular: Negative for chest pain and palpitations.  Gastrointestinal: Negative for abdominal pain, diarrhea and vomiting.  Musculoskeletal: Negative for arthralgias and myalgias.  Skin: Negative for color change and rash.  Neurological: Negative for tremors, syncope and headaches.  Psychiatric/Behavioral: Negative for confusion and dysphoric mood.     Physical Exam Updated Vital Signs BP 127/77   Pulse 63   Temp 98.5 F (36.9 C) (Oral)   Resp 18   SpO2 100%   Physical Exam  Constitutional: He is oriented to person, place, and time. He appears well-developed and well-nourished.  HENT:  Head: Normocephalic and atraumatic.  Eyes: Pupils are equal, round, and reactive to light. EOM are normal.  Neck: Normal range of motion. Neck supple. No JVD present.  Cardiovascular: Normal rate and regular rhythm.  Exam reveals no gallop and no friction rub.   No murmur heard. Pulmonary/Chest: No respiratory distress. He has no wheezes.  Abdominal: He exhibits no distension and no mass. There is no tenderness. There is no rebound and no guarding.  Musculoskeletal: Normal range of motion.  Neurological: He is alert and oriented to person, place, and time.  Skin: No rash noted. No pallor.  Psychiatric: He has a normal mood and affect. His behavior is normal.  Nursing note and vitals reviewed.    ED Treatments / Results  Labs (all labs ordered are listed, but only abnormal results are displayed) Labs Reviewed  LIPASE, BLOOD  COMPREHENSIVE METABOLIC PANEL  CBC  URINALYSIS, ROUTINE W REFLEX MICROSCOPIC    EKG  EKG Interpretation None       Radiology No results found.  Procedures Procedures (including critical care time)  Medications Ordered in ED Medications - No data to display   Initial Impression / Assessment and Plan / ED Course  I have reviewed the  triage vital signs and the nursing notes.  Pertinent labs & imaging results that were available during my care of the patient were reviewed by me and considered in my medical decision making (see chart for details).     36 yo M with diffuse crampy abdominal pain. Labs are unremarkable. No focal abdominal tenderness. No tenderness in the right upper quadrant negative Murphy sign. I do not feel that the patient has acute cholecystitis or other surgical emergency. Discussed with the family that this could be an early onset of a virus or potentially constipation. Recommended a trial of MiraLAX and follow-up with the general surgeon.  3:21 AM:  I have discussed the diagnosis/risks/treatment options with the patient and family and believe the pt to be eligible for discharge home to follow-up with Gen surgery. We also discussed returning to the ED immediately if new or worsening sx occur. We discussed the sx which are most concerning (e.g., sudden worsening pain, fever, inability to tolerate by mouth) that necessitate immediate return. Medications administered to the patient during their  visit and any new prescriptions provided to the patient are listed below.  Medications given during this visit Medications - No data to display   The patient appears reasonably screen and/or stabilized for discharge and I doubt any other medical condition or other [facility] requiring further screening, evaluation, or treatment in the ED at this time prior to discharge.   Final Clinical Impressions(s) / ED Diagnoses   Final diagnoses:  Generalized abdominal pain    New Prescriptions New Prescriptions   No medications on file     Deno Etienne, DO 01/14/17 0321

## 2017-01-14 NOTE — Discharge Instructions (Signed)
Try miralax at home for this.  Return for abdominal pain he can point to with one finger, fever, uncontrolled vomiting

## 2017-01-18 ENCOUNTER — Ambulatory Visit: Payer: Self-pay | Admitting: Family Medicine

## 2017-01-24 ENCOUNTER — Encounter: Payer: Self-pay | Admitting: Family Medicine

## 2017-01-24 ENCOUNTER — Ambulatory Visit: Payer: Medicaid Other | Attending: Family Medicine | Admitting: Family Medicine

## 2017-01-24 VITALS — BP 119/73 | HR 64 | Temp 98.2°F | Ht 72.0 in | Wt 153.0 lb

## 2017-01-24 DIAGNOSIS — F3175 Bipolar disorder, in partial remission, most recent episode depressed: Secondary | ICD-10-CM | POA: Diagnosis not present

## 2017-01-24 DIAGNOSIS — G40909 Epilepsy, unspecified, not intractable, without status epilepticus: Secondary | ICD-10-CM | POA: Diagnosis not present

## 2017-01-24 DIAGNOSIS — K8020 Calculus of gallbladder without cholecystitis without obstruction: Secondary | ICD-10-CM | POA: Diagnosis not present

## 2017-01-24 DIAGNOSIS — F319 Bipolar disorder, unspecified: Secondary | ICD-10-CM | POA: Diagnosis not present

## 2017-01-24 DIAGNOSIS — Z880 Allergy status to penicillin: Secondary | ICD-10-CM | POA: Insufficient documentation

## 2017-01-24 DIAGNOSIS — Z885 Allergy status to narcotic agent status: Secondary | ICD-10-CM | POA: Insufficient documentation

## 2017-01-24 DIAGNOSIS — R21 Rash and other nonspecific skin eruption: Secondary | ICD-10-CM

## 2017-01-24 DIAGNOSIS — Z9109 Other allergy status, other than to drugs and biological substances: Secondary | ICD-10-CM | POA: Diagnosis not present

## 2017-01-24 DIAGNOSIS — Z91018 Allergy to other foods: Secondary | ICD-10-CM | POA: Insufficient documentation

## 2017-01-24 DIAGNOSIS — Z888 Allergy status to other drugs, medicaments and biological substances status: Secondary | ICD-10-CM | POA: Insufficient documentation

## 2017-01-24 MED ORDER — NYSTATIN-TRIAMCINOLONE 100000-0.1 UNIT/GM-% EX OINT: 1.0000 "application " | TOPICAL_OINTMENT | Freq: Two times a day (BID) | CUTANEOUS | 0 refills | Status: DC

## 2017-01-24 NOTE — Patient Instructions (Signed)
Cholelithiasis Cholelithiasis is also called "gallstones." It is a kind of gallbladder disease. The gallbladder is an organ that stores a liquid (bile) that helps you digest fat. Gallstones may not cause symptoms (may be silent gallstones) until they cause a blockage, and then they can cause pain (gallbladder attack). Follow these instructions at home:  Take over-the-counter and prescription medicines only as told by your doctor.  Stay at a healthy weight.  Eat healthy foods. This includes: ? Eating fewer fatty foods, like fried foods. ? Eating fewer refined carbs (refined carbohydrates). Refined carbs are breads and grains that are highly processed, like white bread and white rice. Instead, choose whole grains like whole-wheat bread and brown rice. ? Eating more fiber. Almonds, fresh fruit, and beans are healthy sources of fiber.  Keep all follow-up visits as told by your doctor. This is important. Contact a doctor if:  You have sudden pain in the upper right side of your belly (abdomen). Pain might spread to your right shoulder or your chest. This may be a sign of a gallbladder attack.  You feel sick to your stomach (are nauseous).  You throw up (vomit).  You have been diagnosed with gallstones that have no symptoms and you get: ? Belly pain. ? Discomfort, burning, or fullness in the upper part of your belly (indigestion). Get help right away if:  You have sudden pain in the upper right side of your belly, and it lasts for more than 2 hours.  You have belly pain that lasts for more than 5 hours.  You have a fever or chills.  You keep feeling sick to your stomach or you keep throwing up.  Your skin or the whites of your eyes turn yellow (jaundice).  You have dark-colored pee (urine).  You have light-colored poop (stool). Summary  Cholelithiasis is also called "gallstones."  The gallbladder is an organ that stores a liquid (bile) that helps you digest fat.  Silent  gallstones are gallstones that do not cause symptoms.  A gallbladder attack may cause sudden pain in the upper right side of your belly. Pain might spread to your right shoulder or your chest. If this happens, contact your doctor.  If you have sudden pain in the upper right side of your belly that lasts for more than 2 hours, get help right away. This information is not intended to replace advice given to you by your health care provider. Make sure you discuss any questions you have with your health care provider. Document Released: 11/03/2007 Document Revised: 02/01/2016 Document Reviewed: 02/01/2016 Elsevier Interactive Patient Education  2017 [employer]  

## 2017-01-24 NOTE — Progress Notes (Signed)
Subjective:  Patient ID: Franklin [name], male    DOB: 31-Aug-1980  Age: 36 y.o. MRN: [id number]  CC: Follow-up   HPI Franklin [name] is a 35 year old male with a history of nonepileptic spells, bipolar disorder, mild cognitive impairment who is accompanied by his fiance today to the clinic for an ED follow-up where he was seen for abdominal pain, one week ago. CT abdomen and pelvis revealed cholelithiasis but no cholecystitis.  Today he denies abdominal pain but states that it is usually intermittent and comes in spells halting his movement and affecting his appetite. He would like to have his gallstones surgically removed.  He currently receives treatment for his bipolar disorder at [facility] on [address] and  endorses compliance with Tegretol. Denies suicidal ideation or intents. Also requests completion of FL2 for PCS services which I have completed.  He complains of a pruritic rash his upper back and is unsure of the duration. Denies presence of similar lesions in other body parts.  Past Medical History:  Diagnosis Date  . Bipolar 1 disorder ([place])   . Chronic headaches   . Conversion disorder with seizures or convulsions   . 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 Depression   . Hx of electroencephalogram 12/2014   normal  . Mild cognitive impairment   . Psychogenic nonepileptic seizure   . Schizophrenic disorder ([place])   . Seizures ([place])     No past surgical history on file.  Allergies  Allergen Reactions  . Coconut Oil Anaphylaxis and Nausea And Vomiting    Reaction to any kind of coconut  . Other Anaphylaxis    raisin  . Codeine Nausea And Vomiting  . Depakote [Divalproex Sodium] Other (See Comments)    Pt states that this medication makes him feel like he is out of it.   Marland Kitchen Penicillins Nausea And Vomiting    Has patient had a PCN reaction causing immediate rash,  facial/tongue/throat swelling, SOB or lightheadedness with hypotension: No Has patient had a PCN reaction causing severe rash involving mucus membranes or skin necrosis: No Has patient had a PCN reaction that required hospitalization No Has patient had a PCN reaction occurring within the last 10 years: No If all of the above answers are "NO", then may proceed with Cephalosporin use.    . Tape Other (See Comments)    Reaction:  Tears skin, Pt is able to use paper tape.       Outpatient Medications Prior to Visit  Medication Sig Dispense Refill  . hydrOXYzine (ATARAX/VISTARIL) 25 MG tablet Take 1 tablet (25 mg total) by mouth every 6 (six) hours. 12 tablet 0  . OXcarbazepine (TRILEPTAL) 150 MG tablet Take 1 tablet (150 mg total) by mouth 2 (two) times daily. (Patient taking differently: Take 75 mg by mouth 2 (two) times daily. ) 180 tablet 3  . cetirizine (ZYRTEC) 10 MG tablet Take 1 tablet (10 mg total) by mouth daily. (Patient not taking: Reported on 01/24/2017) 30 tablet 0  . dicyclomine (BENTYL) 20 MG tablet Take 1 tablet (20 mg total) by mouth 2 (two) times daily. (Patient not taking: Reported on 01/24/2017) 20 tablet 0  . fluticasone (FLONASE) 50 MCG/ACT nasal spray Place 1 spray into both nostrils daily. (Patient not taking: Reported on 01/24/2017) 16 g 0  . ibuprofen (ADVIL,MOTRIN) 200 MG tablet Take 400 mg by mouth every 6 (six) hours as needed for moderate pain.     Marland Kitchen  HYDROcodone-acetaminophen (NORCO/VICODIN) 5-325 MG tablet Take 1-2 tablets by mouth every 6 (six) hours as needed. (Patient not taking: Reported on 01/24/2017) 6 tablet 0  . ondansetron (ZOFRAN ODT) 4 MG disintegrating tablet Take 1 tablet (4 mg total) by mouth every 8 (eight) hours as needed for nausea or vomiting. (Patient not taking: Reported on 01/24/2017) 20 tablet 0   No facility-administered medications prior to visit.     ROS Review of Systems  Constitutional: Negative for activity change and appetite change.    HENT: Negative for sinus pressure and sore throat.   Eyes: Negative for visual disturbance.  Respiratory: Negative for cough, chest tightness and shortness of breath.   Cardiovascular: Negative for chest pain and leg swelling.  Gastrointestinal: Negative for abdominal distention, abdominal pain, constipation and diarrhea.  Endocrine: Negative.   Genitourinary: Negative for dysuria.  Musculoskeletal: Negative for joint swelling and myalgias.  Skin: Positive for rash.  Allergic/Immunologic: Negative.   Neurological: Negative for weakness, light-headedness and numbness.  Psychiatric/Behavioral: Negative for dysphoric mood and suicidal ideas.    Objective:  BP 119/73   Pulse 64   Temp 98.2 F (36.8 C) (Oral)   Ht 6' (1.829 m)   Wt 153 lb (69.4 kg)   SpO2 99%   BMI 20.75 kg/m   BP/Weight 01/24/2017 11/25/3660 02/01/7653  Systolic BP 650 354 656  Diastolic BP 73 71 80  Wt. (Lbs) 153 - 153.6  BMI 20.75 - 20.83      Physical Exam  Constitutional: He is oriented to person, place, and time. He appears well-developed and well-nourished.  Cardiovascular: Normal rate, normal heart sounds and intact distal pulses.   No murmur heard. Pulmonary/Chest: Effort normal and breath sounds normal. He has no wheezes. He has no rales. He exhibits no tenderness.  Abdominal: Soft. Bowel sounds are normal. He exhibits no distension and no mass. There is no tenderness.  Musculoskeletal: Normal range of motion.  Neurological: He is alert and oriented to person, place, and time.  Skin: Skin is warm and dry.  Well-circumscribed rash on right side of lower neck with scaly surface  Psychiatric: He has a normal mood and affect.     Assessment & Plan:   1. Seizure disorder [facility]) Last seizure was last month Keep appointment with neurology  2. Bipolar disorder, in partial remission, most recent episode depressed ([place]) Currently taking Trileptal Followed by mental health - [facility]  3. Calculus  of gallbladder without cholecystitis without obstruction Could explain intermittent abdominal pain which is absent this time He would like surgical management - Ambulatory referral to General Surgery  4. Rash - nystatin-triamcinolone ointment (MYCOLOG); Apply 1 application topically 2 (two) times daily.  Dispense: 30 g; Refill: 0   Meds ordered this encounter  Medications  . nystatin-triamcinolone ointment (MYCOLOG)    Sig: Apply 1 application topically 2 (two) times daily.    Dispense:  30 g    Refill:  0    Follow-up: Return in about 3 months (around 04/26/2017) for Follow-up of seizures.   Arnoldo Morale MD

## 2017-01-25 ENCOUNTER — Telehealth: Payer: Self-pay | Admitting: Family Medicine

## 2017-01-25 ENCOUNTER — Other Ambulatory Visit: Payer: Self-pay | Admitting: Pharmacist

## 2017-01-25 ENCOUNTER — Telehealth: Payer: Self-pay

## 2017-01-25 MED ORDER — TRIAMCINOLONE ACETONIDE 0.1 % EX OINT: 1.0000 "application " | TOPICAL_OINTMENT | Freq: Two times a day (BID) | CUTANEOUS | 0 refills | Status: DC

## 2017-01-25 MED ORDER — CLOTRIMAZOLE 1 % EX CREA: 1.0000 "application " | TOPICAL_CREAM | Freq: Two times a day (BID) | CUTANEOUS | 0 refills | Status: DC

## 2017-01-25 MED ORDER — NYSTATIN 100000 UNIT/GM EX OINT: 1.0000 "application " | TOPICAL_OINTMENT | Freq: Two times a day (BID) | CUTANEOUS | 0 refills | Status: DC

## 2017-01-25 NOTE — Telephone Encounter (Signed)
Nystatin-triamcinolone combination ointment is not covered by Medicaid. Reordered it as the separate components as those are covered.

## 2017-01-25 NOTE — Telephone Encounter (Signed)
Pt was prescribed nystatin-triamcinolone ointment (MYCOLOG), this is not covered please follow up.

## 2017-01-25 NOTE — Telephone Encounter (Signed)
Franklin [name] from [employer] called the office asking to speak with PCP regarding the medication that was sent yesterday by fax. Insurance won't cover it. The faxed a document yesterday. Please contact pharmacy, and speak with Vishal [phone].  Thank you.

## 2017-01-25 NOTE — Telephone Encounter (Signed)
Pt contacted the office and stated the cream that Dr. Jarold Song prescribed is not covered by [facility]. Pt is requesting a new cream if possible and would like rx sent to pharmacy on file. Please f/u

## 2017-01-25 NOTE — Telephone Encounter (Signed)
Prescription for clotrimazole sent to the pharmacy.

## 2017-01-26 NOTE — Telephone Encounter (Signed)
Pt was called and a VM was left informing pt of new medication sent to pharmacy.

## 2017-01-26 NOTE — Telephone Encounter (Signed)
Left message on pharmacy voicemail to return call.

## 2017-01-27 ENCOUNTER — Emergency Department (HOSPITAL_COMMUNITY)
Admission: EM | Admit: 2017-01-27 | Discharge: 2017-01-27 | Disposition: A | Discharge status: Home / Self Care | Payer: Medicaid Other | Attending: Emergency Medicine | Admitting: Emergency Medicine

## 2017-01-27 DIAGNOSIS — R319 Hematuria, unspecified: Secondary | ICD-10-CM | POA: Diagnosis present

## 2017-01-27 DIAGNOSIS — Z5321 Procedure and treatment not carried out due to patient leaving prior to being seen by health care provider: Secondary | ICD-10-CM | POA: Diagnosis not present

## 2017-01-27 NOTE — ED Notes (Signed)
Called Pt in lobby to be roomed, no response.

## 2017-01-27 NOTE — ED Triage Notes (Signed)
Per EMS-states patient noticed bright red blood when he urinated 1/2 hour ago-no trauma, no history of the same

## 2017-01-27 NOTE — ED Notes (Signed)
Called pt in lobby x2, no response, do not see Pt in lobby.

## 2017-02-17 ENCOUNTER — Encounter (HOSPITAL_COMMUNITY): Payer: Self-pay | Admitting: Emergency Medicine

## 2017-02-17 ENCOUNTER — Emergency Department (HOSPITAL_COMMUNITY): Payer: Medicaid Other

## 2017-02-17 ENCOUNTER — Emergency Department (HOSPITAL_COMMUNITY)
Admission: EM | Admit: 2017-02-17 | Discharge: 2017-02-17 | Disposition: A | Discharge status: Home / Self Care | Payer: Medicaid Other | Attending: Emergency Medicine | Admitting: Emergency Medicine

## 2017-02-17 DIAGNOSIS — Y999 Unspecified external cause status: Secondary | ICD-10-CM | POA: Diagnosis not present

## 2017-02-17 DIAGNOSIS — R55 Syncope and collapse: Secondary | ICD-10-CM | POA: Insufficient documentation

## 2017-02-17 DIAGNOSIS — Y939 Activity, unspecified: Secondary | ICD-10-CM | POA: Diagnosis not present

## 2017-02-17 DIAGNOSIS — S0990XA Unspecified injury of head, initial encounter: Secondary | ICD-10-CM | POA: Insufficient documentation

## 2017-02-17 DIAGNOSIS — R569 Unspecified convulsions: Secondary | ICD-10-CM | POA: Diagnosis present

## 2017-02-17 DIAGNOSIS — Y929 Unspecified place or not applicable: Secondary | ICD-10-CM | POA: Insufficient documentation

## 2017-02-17 LAB — CBC WITH DIFFERENTIAL/PLATELET
BASOS PCT: 0 %
Basophils Absolute: 0 10*3/uL (ref 0.0–0.1)
EOS ABS: 0.1 10*3/uL (ref 0.0–0.7)
EOS PCT: 1 %
HCT: 42.6 % (ref 39.0–52.0)
Hemoglobin: 14.7 g/dL (ref 13.0–17.0)
Lymphocytes Relative: 13 %
Lymphs Abs: 1.4 10*3/uL (ref 0.7–4.0)
MCH: 29.6 pg (ref 26.0–34.0)
MCHC: 34.5 g/dL (ref 30.0–36.0)
MCV: 85.9 fL (ref 78.0–100.0)
Monocytes Absolute: 0.6 10*3/uL (ref 0.1–1.0)
Monocytes Relative: 5 %
Neutro Abs: 8.4 10*3/uL — ABNORMAL HIGH (ref 1.7–7.7)
Neutrophils Relative %: 81 %
PLATELETS: 201 10*3/uL (ref 150–400)
RBC: 4.96 MIL/uL (ref 4.22–5.81)
RDW: 12.5 % (ref 11.5–15.5)
WBC: 10.5 10*3/uL (ref 4.0–10.5)

## 2017-02-17 LAB — COMPREHENSIVE METABOLIC PANEL
ALBUMIN: 4.8 g/dL (ref 3.5–5.0)
ALK PHOS: 76 U/L (ref 38–126)
ALT: 22 U/L (ref 17–63)
ANION GAP: 5 (ref 5–15)
AST: 26 U/L (ref 15–41)
BUN: 15 mg/dL (ref 6–20)
CHLORIDE: 106 mmol/L (ref 101–111)
CO2: 28 mmol/L (ref 22–32)
Calcium: 9.5 mg/dL (ref 8.9–10.3)
Creatinine, Ser: 1.15 mg/dL (ref 0.61–1.24)
GFR calc Af Amer: >60 mL/min (ref >60)
GFR calc non Af Amer: >60 mL/min (ref >60)
GLUCOSE: 85 mg/dL (ref 65–99)
Potassium: 4.3 mmol/L (ref 3.5–5.1)
SODIUM: 139 mmol/L (ref 135–145)
Total Bilirubin: 1.7 mg/dL — ABNORMAL HIGH (ref 0.3–1.2)
Total Protein: 8.1 g/dL (ref 6.5–8.1)

## 2017-02-17 LAB — CBG MONITORING, ED: Glucose-Capillary: 97 mg/dL (ref 65–99)

## 2017-02-17 MED ORDER — SODIUM CHLORIDE 0.9 % IV BOLUS (SEPSIS)
1000.0000 mL | Freq: Once | INTRAVENOUS | Status: AC
  Administered 2017-02-17: 1000 mL via INTRAVENOUS

## 2017-02-17 NOTE — ED Notes (Signed)
Patient asking that blood draw be taken from IV site. Patient "does not want to be stuck anymore"
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
## 2017-02-17 NOTE — ED Notes (Signed)
Patient transported to CT 
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
## 2017-02-17 NOTE — ED Notes (Signed)
Patient ambulating with steady gait in room and in hallway.

## 2017-02-18 ENCOUNTER — Ambulatory Visit: Payer: Medicaid Other | Attending: Family Medicine | Admitting: Family Medicine

## 2017-02-18 ENCOUNTER — Ambulatory Visit: Payer: Medicaid Other | Admitting: Licensed Clinical Social Worker

## 2017-02-18 ENCOUNTER — Encounter: Payer: Self-pay | Admitting: Family Medicine

## 2017-02-18 DIAGNOSIS — G3184 Mild cognitive impairment, so stated: Secondary | ICD-10-CM | POA: Insufficient documentation

## 2017-02-18 DIAGNOSIS — Z885 Allergy status to narcotic agent status: Secondary | ICD-10-CM | POA: Insufficient documentation

## 2017-02-18 DIAGNOSIS — R05 Cough: Secondary | ICD-10-CM | POA: Diagnosis not present

## 2017-02-18 DIAGNOSIS — M542 Cervicalgia: Secondary | ICD-10-CM | POA: Diagnosis not present

## 2017-02-18 DIAGNOSIS — Z79899 Other long term (current) drug therapy: Secondary | ICD-10-CM | POA: Diagnosis not present

## 2017-02-18 DIAGNOSIS — F319 Bipolar disorder, unspecified: Secondary | ICD-10-CM | POA: Diagnosis not present

## 2017-02-18 DIAGNOSIS — R059 Cough, unspecified: Secondary | ICD-10-CM

## 2017-02-18 DIAGNOSIS — Z888 Allergy status to other drugs, medicaments and biological substances status: Secondary | ICD-10-CM | POA: Insufficient documentation

## 2017-02-18 DIAGNOSIS — R569 Unspecified convulsions: Secondary | ICD-10-CM | POA: Insufficient documentation

## 2017-02-18 DIAGNOSIS — Z880 Allergy status to penicillin: Secondary | ICD-10-CM | POA: Diagnosis not present

## 2017-02-18 DIAGNOSIS — F445 Conversion disorder with seizures or convulsions: Secondary | ICD-10-CM

## 2017-02-18 MED ORDER — CETIRIZINE HCL 10 MG PO TABS: 10.0000 mg | ORAL_TABLET | Freq: Every day | ORAL | 1 refills | Status: DC
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
## 2017-02-21 ENCOUNTER — Telehealth: Payer: Self-pay

## 2017-02-21 NOTE — Telephone Encounter (Signed)
PCS form completed. To PCP for signature

## 2017-02-28 ENCOUNTER — Telehealth: Payer: Self-pay

## 2017-02-28 NOTE — Telephone Encounter (Signed)
Fax received from [facility] noting that the patient's birthday on the referral ( 11-03-1980 -which is noted in [facility]) does not match [place] [employer] ( 02/03/1981).  Attempted to contact the patient to inquire about his birthday.  Call placed to # [phone]  X 2 and both times the call was dropped after 7 rings.

## 2017-02-28 NOTE — Telephone Encounter (Signed)
PCS form faxed to [facility] 

## 2017-03-01 ENCOUNTER — Telehealth: Payer: Self-pay | Admitting: Family Medicine

## 2017-03-01 NOTE — Telephone Encounter (Signed)
As per Kellie Moor, TCC Coordinator, the patient confirmed his correct birthday as 02/03/1981.  The PCS application was updated with the correct birthday and refaxed to [employer].

## 2017-03-01 NOTE — Telephone Encounter (Signed)
Call placed to patient [phone], in regards to verifying patient's birthday. After several rings the call drops. Wanted to inform patient that his application for PCS services was faxed but [employer] informed [place] that his birthday on his Medicaid file is different than to the one that what we have. If incorrect, patient will need to go to [place] office with his birth certificate to have that corrected.

## 2017-03-03 ENCOUNTER — Telehealth: Payer: Self-pay | Admitting: Family Medicine

## 2017-03-03 NOTE — Telephone Encounter (Signed)
Call placed to [facility] [phone] in regards to patient's PCS application. Spoke with Beckie Busing and she informed me that fax was received and that patient's assessment has been scheduled for 03/08/2017.

## 2017-03-08 ENCOUNTER — Encounter (HOSPITAL_COMMUNITY): Payer: Self-pay

## 2017-03-08 ENCOUNTER — Emergency Department (HOSPITAL_COMMUNITY)
Admission: EM | Admit: 2017-03-08 | Discharge: 2017-03-08 | Disposition: A | Discharge status: Home / Self Care | Payer: Medicaid Other | Attending: Emergency Medicine | Admitting: Emergency Medicine

## 2017-03-08 DIAGNOSIS — R310 Gross hematuria: Secondary | ICD-10-CM | POA: Diagnosis not present

## 2017-03-08 DIAGNOSIS — F209 Schizophrenia, unspecified: Secondary | ICD-10-CM | POA: Insufficient documentation

## 2017-03-08 DIAGNOSIS — Z79899 Other long term (current) drug therapy: Secondary | ICD-10-CM | POA: Insufficient documentation

## 2017-03-08 DIAGNOSIS — R319 Hematuria, unspecified: Secondary | ICD-10-CM | POA: Insufficient documentation

## 2017-03-08 DIAGNOSIS — R1030 Lower abdominal pain, unspecified: Secondary | ICD-10-CM | POA: Diagnosis present

## 2017-03-08 DIAGNOSIS — R197 Diarrhea, unspecified: Secondary | ICD-10-CM | POA: Diagnosis not present

## 2017-03-08 LAB — CBC
HCT: 41.2 % (ref 39.0–52.0)
Hemoglobin: 13.8 g/dL (ref 13.0–17.0)
MCH: 29.1 pg (ref 26.0–34.0)
MCHC: 33.5 g/dL (ref 30.0–36.0)
MCV: 86.7 fL (ref 78.0–100.0)
PLATELETS: 170 10*3/uL (ref 150–400)
RBC: 4.75 MIL/uL (ref 4.22–5.81)
RDW: 12.8 % (ref 11.5–15.5)
WBC: 5.6 10*3/uL (ref 4.0–10.5)

## 2017-03-08 LAB — URINALYSIS, ROUTINE W REFLEX MICROSCOPIC
BACTERIA UA: NONE SEEN
Bilirubin Urine: NEGATIVE
Glucose, UA: NEGATIVE mg/dL
Hgb urine dipstick: NEGATIVE
Ketones, ur: NEGATIVE mg/dL
Leukocytes, UA: NEGATIVE
Nitrite: NEGATIVE
PROTEIN: 30 mg/dL — AB
SPECIFIC GRAVITY, URINE: 1.029 (ref 1.005–1.030)
Squamous Epithelial / LPF: NONE SEEN
pH: 7 (ref 5.0–8.0)

## 2017-03-08 LAB — COMPREHENSIVE METABOLIC PANEL
ALBUMIN: 4.2 g/dL (ref 3.5–5.0)
ALT: 28 U/L (ref 17–63)
AST: 29 U/L (ref 15–41)
Alkaline Phosphatase: 70 U/L (ref 38–126)
Anion gap: 8 (ref 5–15)
BUN: 11 mg/dL (ref 6–20)
CHLORIDE: 108 mmol/L (ref 101–111)
CO2: 23 mmol/L (ref 22–32)
CREATININE: 0.8 mg/dL (ref 0.61–1.24)
Calcium: 9.2 mg/dL (ref 8.9–10.3)
GFR calc Af Amer: >60 mL/min (ref >60)
GFR calc non Af Amer: >60 mL/min (ref >60)
GLUCOSE: 104 mg/dL — AB (ref 65–99)
Potassium: 3.9 mmol/L (ref 3.5–5.1)
SODIUM: 139 mmol/L (ref 135–145)
Total Bilirubin: 1 mg/dL (ref 0.3–1.2)
Total Protein: 7.1 g/dL (ref 6.5–8.1)

## 2017-03-08 LAB — LIPASE, BLOOD: LIPASE: 27 U/L (ref 11–51)

## 2017-03-08 MED ORDER — IBUPROFEN 800 MG PO TABS
800.0000 mg | ORAL_TABLET | Freq: Once | ORAL | Status: AC
  Administered 2017-03-08: 800 mg via ORAL
  Filled 2017-03-08: qty 1

## 2017-03-08 NOTE — ED Notes (Signed)
PT states understanding of care given, follow up care. PT ambulated from ED to car with a steady gait.  

## 2017-03-08 NOTE — ED Provider Notes (Signed)
TIME SEEN: 4:47 AM  CHIEF COMPLAINT: Lower abdominal pain, hematuria  HPI: Patient is a 36 year old male with history of bipolar disorder, nonepileptic seizures who presents emergency department with lower abdominal pain and hematuria that started yesterday. Abdominal pain is intermittent and gone currently. No associated fevers, chills, nausea or vomiting. Has had "slight" diarrhea. No bloody stool or melena. No dysuria, penile discharge, testicular pain or swelling. He is not sexually active. No history of STDs. No history of abdominal surgeries. No aggravating or relieving factors.  ROS: See HPI Constitutional: no fever  Eyes: no drainage  ENT: no runny nose   Cardiovascular:  no chest pain  Resp: no SOB  GI: no vomiting GU: no dysuria; + hematuria Integumentary: no rash  Allergy: no hives  Musculoskeletal: no leg swelling  Neurological: no slurred speech ROS otherwise negative  PAST MEDICAL HISTORY/PAST SURGICAL HISTORY:  Past Medical History:  Diagnosis Date  . Bipolar 1 disorder ([place])   . Chronic headaches   . Conversion disorder with seizures or convulsions   . 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 Depression   . Hx of electroencephalogram 12/2014   normal  . Mild cognitive impairment   . Psychogenic nonepileptic seizure   . Schizophrenic disorder ([place])   . Seizures ([place])     MEDICATIONS:  Prior to Admission medications   Medication Sig Start Date End Date Taking? Authorizing Provider  cetirizine (ZYRTEC) 10 MG tablet Take 1 tablet (10 mg total) by mouth daily. 02/18/17   Arnoldo Morale, MD  clotrimazole (LOTRIMIN) 1 % cream Apply 1 application topically 2 (two) times daily. 01/25/17   Arnoldo Morale, MD  ibuprofen (ADVIL,MOTRIN) 200 MG tablet Take 400 mg by mouth every 6 (six) hours as needed for moderate pain.     [provider]  nystatin ointment (MYCOSTATIN) Apply 1 application  topically 2 (two) times daily. 01/25/17   Arnoldo Morale, MD  OXcarbazepine (TRILEPTAL) 150 MG tablet Take 1 tablet (150 mg total) by mouth 2 (two) times daily. 10/20/16   Cameron Sprang, MD  triamcinolone ointment (KENALOG) 0.1 % Apply 1 application topically 2 (two) times daily. 01/25/17   Arnoldo Morale, MD    ALLERGIES:  Allergies  Allergen Reactions  . Coconut Oil Anaphylaxis and Nausea And Vomiting    Reaction to any kind of coconut  . Other Anaphylaxis    raisin  . Codeine Nausea And Vomiting  . Depakote [Divalproex Sodium] Other (See Comments)    Pt states that this medication makes him feel like he is out of it.   Marland Kitchen Penicillins Nausea And Vomiting    Has patient had a PCN reaction causing immediate rash, facial/tongue/throat swelling, SOB or lightheadedness with hypotension: No Has patient had a PCN reaction causing severe rash involving mucus membranes or skin necrosis: No Has patient had a PCN reaction that required hospitalization No Has patient had a PCN reaction occurring within the last 10 years: No If all of the above answers are "NO", then may proceed with Cephalosporin use.    . Tape Other (See Comments)    Reaction:  Tears skin, Pt is able to use paper tape.      SOCIAL HISTORY:  Social History  Substance Use Topics  . Smoking status: Never Smoker  . Smokeless tobacco: Never Used  . Alcohol use 0.0 oz/week     Comment: 40oz today    FAMILY HISTORY: Family History  Problem Relation Age of Onset  . Heart disease Mother     EXAM: BP 130/83 (BP Location: Left Arm)   Pulse (!) 50   Temp 98.4 F (36.9 C) (Oral)   Resp 16   Ht 6\' 1"  (1.854 m)   Wt 69.9 kg (154 lb)   SpO2 100%   BMI 20.32 kg/m  CONSTITUTIONAL: Alert and oriented and responds appropriately to questions. Well-appearing; well-nourished HEAD: Normocephalic EYES: Conjunctivae clear, pupils appear equal, EOMI ENT: normal nose; moist mucous membranes NECK: Supple, no meningismus, no nuchal  rigidity, no LAD  CARD: RRR; S1 and S2 appreciated; no murmurs, no clicks, no rubs, no gallops RESP: Normal chest excursion without splinting or tachypnea; breath sounds clear and equal bilaterally; no wheezes, no rhonchi, no rales, no hypoxia or respiratory distress, speaking full sentences ABD/GI: Normal bowel sounds; non-distended; soft, Very minimal tenderness in the suprapubic region but no tenderness at McBurney's point, no rebound, no guarding, no peritoneal signs, no hepatosplenomegaly BACK:  The back appears normal and is non-tender to palpation, there is no CVA tenderness EXT: Normal ROM in all joints; non-tender to palpation; no edema; normal capillary refill; no cyanosis, no calf tenderness or swelling    SKIN: Normal color for age and race; warm; no rash NEURO: Moves all extremities equally PSYCH: The patient's mood and manner are appropriate. Grooming and personal hygiene are appropriate.  MEDICAL DECISION MAKING: Patient here with hematuria and lower abdominal pain. Abdominal exam is benign. Doubt appendicitis, colitis, diverticulitis or bowel obstruction. Differential includes urinary tract infection. No flank pain on exam. No vomiting. No fever. Doubt pyelonephritis. Labs unremarkable with no leukocytosis, normal creatinine, normal LFTs and lipase. Urine pending. Will give ibuprofen per his request although he is denying any current pain.  ED PROGRESS: Urine shows no obvious sign of infection. No blood. We'll send urine culture and urine gonorrhea and chlamydia. Recommend alternating Tylenol Motrin, increase fluid intake. Recommend he follow-up with his primary care physician if symptoms continue. At this time he is asymptomatic.   At this time, I do not feel there is any life-threatening condition present. I have reviewed and discussed all results (EKG, imaging, lab, urine as appropriate) and exam findings with patient/family. I have reviewed nursing notes and appropriate previous  records.  I feel the patient is safe to be discharged home without further emergent workup and can continue workup as an outpatient as needed. Discussed usual and customary return precautions. Patient/family verbalize understanding and are comfortable with this plan.  Outpatient follow-up has been provided if needed. All questions have been answered.      Breklyn Fabrizio, Delice Bison, DO 03/08/17 [phone]

## 2017-03-08 NOTE — Discharge Instructions (Signed)
You may alternate Tylenol 1000 mg every 6 hours as needed for pain and Ibuprofen 800 mg every 8 hours as needed for pain.  Please take Ibuprofen with food.   Your labs were normal today. Urine showed no sign of infection or blood. I recommend that if you had any return of your abdominal pain or blood in your urine that she follow-up closely with your primary care physician. If you develop fevers, vomiting and cannot stop, severe abdominal pain, are unable to urinate for more than 6 hours, please return to the closest hospital.

## 2017-03-08 NOTE — ED Triage Notes (Signed)
Pt states that started having hematuria today along with lower abd pain, denies n/v/d

## 2017-03-09 LAB — URINE CULTURE: CULTURE: NO GROWTH

## 2017-03-11 ENCOUNTER — Emergency Department (HOSPITAL_COMMUNITY)
Admission: EM | Admit: 2017-03-11 | Discharge: 2017-03-11 | Disposition: A | Discharge status: Home / Self Care | Payer: Medicaid Other | Attending: Emergency Medicine | Admitting: Emergency Medicine

## 2017-03-11 ENCOUNTER — Encounter (HOSPITAL_COMMUNITY): Payer: Self-pay | Admitting: Emergency Medicine

## 2017-03-11 DIAGNOSIS — G4089 Other seizures: Secondary | ICD-10-CM | POA: Diagnosis present

## 2017-03-11 DIAGNOSIS — F445 Conversion disorder with seizures or convulsions: Secondary | ICD-10-CM | POA: Diagnosis not present

## 2017-03-11 DIAGNOSIS — Z79899 Other long term (current) drug therapy: Secondary | ICD-10-CM | POA: Insufficient documentation

## 2017-03-11 LAB — CBC
HEMATOCRIT: 40.9 % (ref 39.0–52.0)
HEMOGLOBIN: 13.9 g/dL (ref 13.0–17.0)
MCH: 29.4 pg (ref 26.0–34.0)
MCHC: 34 g/dL (ref 30.0–36.0)
MCV: 86.5 fL (ref 78.0–100.0)
Platelets: 182 10*3/uL (ref 150–400)
RBC: 4.73 MIL/uL (ref 4.22–5.81)
RDW: 12.6 % (ref 11.5–15.5)
WBC: 7.1 10*3/uL (ref 4.0–10.5)

## 2017-03-11 LAB — BASIC METABOLIC PANEL
ANION GAP: 7 (ref 5–15)
BUN: 13 mg/dL (ref 6–20)
CALCIUM: 9.2 mg/dL (ref 8.9–10.3)
CHLORIDE: 105 mmol/L (ref 101–111)
CO2: 26 mmol/L (ref 22–32)
Creatinine, Ser: 0.95 mg/dL (ref 0.61–1.24)
GFR calc non Af Amer: >60 mL/min (ref >60)
GLUCOSE: 82 mg/dL (ref 65–99)
Potassium: 3.9 mmol/L (ref 3.5–5.1)
Sodium: 138 mmol/L (ref 135–145)

## 2017-03-11 NOTE — ED Provider Notes (Signed)
[facility] DEPT Provider Note   CSN: [id number] Arrival date & time:        History   Chief Complaint Chief Complaint  Patient presents with  . Seizures    HPI Franklin [name] is a 36 y.o. male.  HPI Patient is a 36 year old male with a history of bipolar disorder and a history of psychogenic nonepileptic seizures.  He presents the emergency department after several seizures the last approximate 5 minutes.  He was given 5 mg of IM Versed by EMS.  She reportedly became slightly agitated between seizures.  He now is alert and oriented and is following commands.  Family reports no new change in his medications.  Family reports a long-standing history of this.  No recent illness or fevers.   Past Medical History:  Diagnosis Date  . Bipolar 1 disorder ([place])   . Chronic headaches   . Conversion disorder with seizures or convulsions   . 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 Depression   . Hx of electroencephalogram 12/2014   normal  . Mild cognitive impairment   . Psychogenic nonepileptic seizure   . Schizophrenic disorder ([place])   . Seizures [facility])     Patient Active Problem List   Diagnosis Date Noted  . Psychogenic nonepileptic seizure 02/18/2017  . Cholelithiasis 01/24/2017  . 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 Abnormal EKG 11/30/2015  . Elevated troponin 11/30/2015  . Bipolar affective disorder, most recent episode unspecified type, remission status unspecified 03/11/2015  . Bipolar affective disorder ([place]) 01/08/2015  . Generalized idiopathic epilepsy and epileptic syndromes, without status epilepticus, not intractable ([place]) 11/27/2014  . Seizure disorder ([place]) 10/14/2014    History reviewed. No pertinent surgical history.     Home Medications    Prior to Admission medications   Medication Sig Start Date End Date Taking? Authorizing Provider    cetirizine (ZYRTEC) 10 MG tablet Take 1 tablet (10 mg total) by mouth daily. 02/18/17  Yes Arnoldo Morale, MD  clotrimazole (LOTRIMIN) 1 % cream Apply 1 application topically 2 (two) times daily. 01/25/17  Yes Arnoldo Morale, MD  nystatin ointment (MYCOSTATIN) Apply 1 application topically 2 (two) times daily. 01/25/17  Yes Arnoldo Morale, MD  OXcarbazepine (TRILEPTAL) 150 MG tablet Take 1 tablet (150 mg total) by mouth 2 (two) times daily. 10/20/16  Yes Cameron Sprang, MD  triamcinolone ointment (KENALOG) 0.1 % Apply 1 application topically 2 (two) times daily. 01/25/17  Yes Arnoldo Morale, MD    Family History Family History  Problem Relation Age of Onset  . Heart disease Mother     Social History Social History  Substance Use Topics  . Smoking status: Never Smoker  . Smokeless tobacco: Never Used  . Alcohol use 0.0 oz/week     Comment: 40oz today     Allergies   Coconut oil; Other; Codeine; Depakote [divalproex sodium]; Penicillins; and Tape   Review of Systems Review of Systems  All other systems reviewed and are negative.    Physical Exam Updated Vital Signs BP 110/69   Pulse (!) 54   Temp 98.4 F (36.9 C) (Oral)   Resp 15   SpO2 100%   Physical Exam  Constitutional: He is oriented to person, place, and time. He appears well-developed and well-nourished.  HENT:  Head: Normocephalic and atraumatic.  Eyes: Pupils are equal, round, and reactive to light. EOM are normal.  Neck: Normal range  of motion.  Cardiovascular: Normal rate, regular rhythm, normal heart sounds and intact distal pulses.   Pulmonary/Chest: Effort normal and breath sounds normal. No respiratory distress.  Abdominal: Soft. He exhibits no distension. There is no tenderness.  Musculoskeletal: Normal range of motion.  Neurological: He is alert and oriented to person, place, and time.  5/5 strength in major muscle groups of  bilateral upper and lower extremities. Speech normal. No facial asymetry.    Skin: Skin is warm and dry.  Psychiatric: He has a normal mood and affect. Judgment normal.  Nursing note and vitals reviewed.    ED Treatments / Results  Labs (all labs ordered are listed, but only abnormal results are displayed) Labs Reviewed  BASIC METABOLIC PANEL  CBC  CBG MONITORING, ED    EKG  EKG Interpretation None       Radiology No results found.  Procedures Procedures (including critical care time)  Medications Ordered in ED Medications - No data to display   Initial Impression / Assessment and Plan / ED Course  I have reviewed the triage vital signs and the nursing notes.  Pertinent labs & imaging results that were available during my care of the patient were reviewed by me and considered in my medical decision making (see chart for details).     Observed in the emergency department.  No other episodes of seizure-like activity.  I suspect this is ongoing psychogenic nonepileptic seizures.  Outpatient PCP and neurology follow-up.  Alert and oriented.  No complaints.  Final Clinical Impressions(s) / ED Diagnoses   Final diagnoses:  Psychogenic nonepileptic seizure    New Prescriptions New Prescriptions   No medications on file     Jola Schmidt, MD 03/11/17 [phone]

## 2017-03-11 NOTE — ED Triage Notes (Signed)
Pt has significant history of seizures, Per EMS they were called of because of a seizure that last 5 min.  When they arrived he had an additional seizure that lasted 5 min.  When he arrived to [facility] he had another seizure that last to minutes.  In between seizure while in EMS care he became agitated.  Now his is alert and following commands and starting to talk AOx4. NAD noted at this time.

## 2017-03-11 NOTE — ED Notes (Signed)
Patient given discharge instructions and verbalized understanding.  Patient stable to discharge at this time.  Patient is alert and oriented to baseline.  No distressed noted at this time.  All belongings taken with the patient at discharge.   

## 2017-03-13 LAB — CBG MONITORING, ED: Glucose-Capillary: 76 mg/dL (ref 65–99)

## 2017-04-06 ENCOUNTER — Emergency Department (HOSPITAL_COMMUNITY): Payer: Medicaid Other

## 2017-04-06 ENCOUNTER — Encounter (HOSPITAL_COMMUNITY): Payer: Self-pay | Admitting: *Deleted

## 2017-04-06 ENCOUNTER — Other Ambulatory Visit: Payer: Self-pay

## 2017-04-06 ENCOUNTER — Emergency Department (HOSPITAL_COMMUNITY)
Admission: EM | Admit: 2017-04-06 | Discharge: 2017-04-06 | Disposition: A | Discharge status: Home / Self Care | Payer: Medicaid Other | Attending: Emergency Medicine | Admitting: Emergency Medicine

## 2017-04-06 DIAGNOSIS — R410 Disorientation, unspecified: Secondary | ICD-10-CM | POA: Diagnosis not present

## 2017-04-06 DIAGNOSIS — F445 Conversion disorder with seizures or convulsions: Secondary | ICD-10-CM | POA: Insufficient documentation

## 2017-04-06 DIAGNOSIS — Z79899 Other long term (current) drug therapy: Secondary | ICD-10-CM | POA: Insufficient documentation

## 2017-04-06 DIAGNOSIS — M545 Low back pain: Secondary | ICD-10-CM | POA: Diagnosis not present

## 2017-04-06 DIAGNOSIS — R569 Unspecified convulsions: Secondary | ICD-10-CM | POA: Diagnosis not present

## 2017-04-06 DIAGNOSIS — M542 Cervicalgia: Secondary | ICD-10-CM | POA: Diagnosis not present

## 2017-04-06 DIAGNOSIS — Z885 Allergy status to narcotic agent status: Secondary | ICD-10-CM | POA: Insufficient documentation

## 2017-04-06 DIAGNOSIS — Z880 Allergy status to penicillin: Secondary | ICD-10-CM | POA: Insufficient documentation

## 2017-04-06 LAB — COMPREHENSIVE METABOLIC PANEL
ALBUMIN: 4.1 g/dL (ref 3.5–5.0)
ALK PHOS: 64 U/L (ref 38–126)
ALT: 15 U/L — AB (ref 17–63)
ANION GAP: 6 (ref 5–15)
AST: 16 U/L (ref 15–41)
BILIRUBIN TOTAL: 1.3 mg/dL — AB (ref 0.3–1.2)
BUN: 12 mg/dL (ref 6–20)
CALCIUM: 9.2 mg/dL (ref 8.9–10.3)
CO2: 25 mmol/L (ref 22–32)
CREATININE: 0.82 mg/dL (ref 0.61–1.24)
Chloride: 108 mmol/L (ref 101–111)
GFR calc Af Amer: >60 mL/min (ref >60)
GFR calc non Af Amer: >60 mL/min (ref >60)
GLUCOSE: 88 mg/dL (ref 65–99)
Potassium: 3.9 mmol/L (ref 3.5–5.1)
Sodium: 139 mmol/L (ref 135–145)
TOTAL PROTEIN: 6.8 g/dL (ref 6.5–8.1)

## 2017-04-06 LAB — URINALYSIS, ROUTINE W REFLEX MICROSCOPIC
BACTERIA UA: NONE SEEN
Bilirubin Urine: NEGATIVE
Glucose, UA: NEGATIVE mg/dL
Hgb urine dipstick: NEGATIVE
Ketones, ur: NEGATIVE mg/dL
LEUKOCYTES UA: NEGATIVE
Nitrite: NEGATIVE
PH: 8 (ref 5.0–8.0)
Protein, ur: 30 mg/dL — AB
SPECIFIC GRAVITY, URINE: 1.024 (ref 1.005–1.030)
SQUAMOUS EPITHELIAL / LPF: NONE SEEN
WBC, UA: NONE SEEN WBC/hpf (ref 0–5)

## 2017-04-06 LAB — CBC WITH DIFFERENTIAL/PLATELET
BASOS PCT: 0 %
Basophils Absolute: 0 10*3/uL (ref 0.0–0.1)
Eosinophils Absolute: 0 10*3/uL (ref 0.0–0.7)
Eosinophils Relative: 0 %
HEMATOCRIT: 42.7 % (ref 39.0–52.0)
HEMOGLOBIN: 14.7 g/dL (ref 13.0–17.0)
LYMPHS ABS: 1.8 10*3/uL (ref 0.7–4.0)
Lymphocytes Relative: 19 %
MCH: 29.8 pg (ref 26.0–34.0)
MCHC: 34.4 g/dL (ref 30.0–36.0)
MCV: 86.4 fL (ref 78.0–100.0)
MONOS PCT: 4 %
Monocytes Absolute: 0.4 10*3/uL (ref 0.1–1.0)
NEUTROS ABS: 6.9 10*3/uL (ref 1.7–7.7)
NEUTROS PCT: 77 %
Platelets: 196 10*3/uL (ref 150–400)
RBC: 4.94 MIL/uL (ref 4.22–5.81)
RDW: 12.7 % (ref 11.5–15.5)
WBC: 9.1 10*3/uL (ref 4.0–10.5)

## 2017-04-06 MED ORDER — LORAZEPAM 2 MG/ML IJ SOLN
1.0000 mg | Freq: Once | INTRAMUSCULAR | Status: AC
  Administered 2017-04-06: 1 mg via INTRAVENOUS
  Filled 2017-04-06: qty 1

## 2017-04-06 NOTE — ED Notes (Signed)
1mg  ativan wasted with candace, Rn in [facility]

## 2017-04-06 NOTE — ED Notes (Signed)
Pt remains in c-t blood still needs to be drawn

## 2017-04-06 NOTE — ED Provider Notes (Signed)
[facility] EMERGENCY DEPARTMENT Provider Note   CSN: [id number] Arrival date & time: 04/06/17  1706     History   Chief Complaint Chief Complaint  Patient presents with  . Seizures    HPI Franklin [name] is a 36 y.o. male.  The history is provided by the patient. No language interpreter was used.  Seizures   This is a new problem. The problem has not changed since onset.There were 2 to 3 seizures. Associated symptoms include confusion. Pertinent negatives include no sleepiness. Characteristics include rhythmic jerking. The episode was witnessed. The seizures continued in the ED. The seizure(s) had no focality. There has been no fever. There were no medications administered prior to arrival.   Pt's wife reports pt had a seizure yesterday and a seizure today.  Pt denies missing any medication.    Pt complains of pain in his neck and his low back after fall.   Past Medical History:  Diagnosis Date  . Bipolar 1 disorder ([place])   . Chronic headaches   . Conversion disorder with seizures or convulsions   . 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 Depression   . Hx of electroencephalogram 12/2014   normal  . Mild cognitive impairment   . Psychogenic nonepileptic seizure   . Schizophrenic disorder ([place])   . Seizures [facility])     Patient Active Problem List   Diagnosis Date Noted  . Psychogenic nonepileptic seizure 02/18/2017  . Cholelithiasis 01/24/2017  . 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 Abnormal EKG 11/30/2015  . Elevated troponin 11/30/2015  . Bipolar affective disorder, most recent episode unspecified type, remission status unspecified 03/11/2015  . Bipolar affective disorder ([place]) 01/08/2015  . Generalized idiopathic epilepsy and epileptic syndromes, without status epilepticus, not intractable ([place]) 11/27/2014  . Seizure disorder ([place]) 10/14/2014     History reviewed. No pertinent surgical history.     Home Medications    Prior to Admission medications   Medication Sig Start Date End Date Taking? Authorizing Provider  FLUoxetine (PROZAC) 20 MG tablet Take 20 mg daily by mouth.   Yes [provider]  OXcarbazepine (TRILEPTAL) 150 MG tablet Take 1 tablet (150 mg total) by mouth 2 (two) times daily. 10/20/16  Yes Cameron Sprang, MD  cetirizine (ZYRTEC) 10 MG tablet Take 1 tablet (10 mg total) by mouth daily. Patient not taking: Reported on 04/06/2017 02/18/17   Arnoldo Morale, MD  clotrimazole (LOTRIMIN) 1 % cream Apply 1 application topically 2 (two) times daily. Patient not taking: Reported on 04/06/2017 01/25/17   Arnoldo Morale, MD  nystatin ointment (MYCOSTATIN) Apply 1 application topically 2 (two) times daily. Patient not taking: Reported on 04/06/2017 01/25/17   Arnoldo Morale, MD  triamcinolone ointment (KENALOG) 0.1 % Apply 1 application topically 2 (two) times daily. Patient not taking: Reported on 04/06/2017 01/25/17   Arnoldo Morale, MD    Family History Family History  Problem Relation Age of Onset  . Heart disease Mother     Social History Social History   Tobacco Use  . Smoking status: Never Smoker  . Smokeless tobacco: Never Used  Substance Use Topics  . Alcohol use: Yes    Alcohol/week: 0.0 oz    Comment: 40oz today  . Drug use: No     Allergies   Coconut oil; Other; Codeine; Depakote [divalproex sodium]; Penicillins; and Tape   Review of Systems Review of Systems  Neurological: Positive for seizures.  Psychiatric/Behavioral: Positive for confusion.  All other systems reviewed and are negative.    Physical Exam Updated Vital Signs BP 111/72   Pulse (!) 51   Temp 98.5 F (36.9 C) (Oral)   Resp 17   Ht 6\' 1"  (1.854 m)   Wt 72.6 kg (160 lb)   SpO2 99%   BMI 21.11 kg/m   Physical Exam  Constitutional: He appears well-developed and well-nourished.  HENT:  Head: Normocephalic and  atraumatic.  Right Ear: External ear normal.  Left Ear: External ear normal.  Nose: Nose normal.  Mouth/Throat: Oropharynx is clear and moist.  Eyes: Conjunctivae are normal.  Neck: Normal range of motion. Neck supple.  Cardiovascular: Normal rate and regular rhythm.  No murmur heard. Pulmonary/Chest: Effort normal and breath sounds normal. No respiratory distress.  Abdominal: Soft. There is no tenderness.  Musculoskeletal: He exhibits no edema.  Neurological: He is alert.  Skin: Skin is warm and dry.  Psychiatric: He has a normal mood and affect.  Nursing note and vitals reviewed.    ED Treatments / Results  Labs (all labs ordered are listed, but only abnormal results are displayed) Labs Reviewed  COMPREHENSIVE METABOLIC PANEL - Abnormal; Notable for the following components:      Result Value   ALT 15 (*)    Total Bilirubin 1.3 (*)    All other components within normal limits  URINALYSIS, ROUTINE W REFLEX MICROSCOPIC - Abnormal; Notable for the following components:   Protein, ur 30 (*)    All other components within normal limits  CBC WITH DIFFERENTIAL/PLATELET    EKG  EKG Interpretation None       Radiology Dg Chest 2 View  Result Date: 04/06/2017 CLINICAL DATA:  Seizure, fall and altered mental status. EXAM: CHEST  2 VIEW COMPARISON:  10/23/2016 and prior radiographs FINDINGS: Cardiomegaly again noted. There is no evidence of focal airspace disease, pulmonary edema, suspicious pulmonary nodule/mass, pleural effusion, or pneumothorax. No acute bony abnormalities are identified. IMPRESSION: Cardiomegaly without evidence of acute cardiopulmonary disease. Electronically Signed   By: Margarette Canada M.D.   On: 04/06/2017 19:14   Dg Cervical Spine Complete  Result Date: 04/06/2017 CLINICAL DATA:  Fall.  Seizure.  Pain in the cervical region. EXAM: CERVICAL SPINE - COMPLETE 4+ VIEW COMPARISON:  CT scan from 02/17/2017 FINDINGS: A cervical collar is in place. The T1 vertebral  body is obscured by the patient' s shoulders despite the use of a swimmer's projection. The alignment down the C7 is normal. No prevertebral soft tissue swelling or appreciable fracture or in-collar subluxation. Patient seems to have dental cavities. Uncinate spurring bilaterally at C6-7. IMPRESSION: 1. No cervical spine fracture or in-collar instability is identified. 2. We were unable to see the T1 level well on lateral projections despite the swimmer's view attempt. 3. Dental cavities. 4. Uncinate spurring bilaterally at C6-7. Electronically Signed   By: Van Clines M.D.   On: 04/06/2017 19:16    Procedures Procedures (including critical care time)  Medications Ordered in ED Medications  LORazepam (ATIVAN) injection 1 mg (1 mg Intravenous Given 04/06/17 1750)     Initial Impression / Assessment and Plan / ED Course  I have reviewed the triage vital signs and the nursing notes.  Pertinent labs & imaging results that were available during my care of the patient were reviewed by me and considered in my medical decision making (see chart for details).   Pt had an episode of drooling  while in ED.  Wife reports seizures start that way.  Pt given IV ativan.  Pt's labs are normal.  xrays no fractures.  Pt reports he is suppose to see his neurologist for evaluation next week.  Pt advised to keep appointment    Final Clinical Impressions(s) / ED Diagnoses   Final diagnoses:  Seizure [facility])    ED Discharge Orders    None    An After Visit Summary was printed and given to the patient.    Sidney Ace 04/06/17 2136    [name], MD 04/08/17 1340

## 2017-04-06 NOTE — ED Triage Notes (Signed)
Wife at the bedside  Pt brought from home with seizurfe hsitory  And he arrparently had a seizure just pta.   Seizure yesterday.  No tongue damage  Pts eyes open will not answer questions asked  His wife reports that he has seiizures from stress mental issues

## 2017-04-06 NOTE — ED Notes (Signed)
Able to talk to Pt. Pt. States having a severe headache. Updated pt. On what we're waiting on. Gave pt. A urninal to collect for urinalysis. Will continue to monitor.

## 2017-04-06 NOTE — ED Notes (Signed)
The pt stars straight ahead will not  Follow commaNDS  Wife directing  him

## 2017-04-07 ENCOUNTER — Telehealth: Payer: Self-pay | Admitting: Neurology

## 2017-04-07 ENCOUNTER — Emergency Department (HOSPITAL_COMMUNITY)
Admission: EM | Admit: 2017-04-07 | Discharge: 2017-04-07 | Disposition: A | Discharge status: Home / Self Care | Payer: Medicaid Other | Attending: Emergency Medicine | Admitting: Emergency Medicine

## 2017-04-07 ENCOUNTER — Ambulatory Visit: Payer: Self-pay | Admitting: Neurology

## 2017-04-07 DIAGNOSIS — F250 Schizoaffective disorder, bipolar type: Secondary | ICD-10-CM | POA: Diagnosis not present

## 2017-04-07 DIAGNOSIS — Z79899 Other long term (current) drug therapy: Secondary | ICD-10-CM | POA: Insufficient documentation

## 2017-04-07 DIAGNOSIS — F445 Conversion disorder with seizures or convulsions: Secondary | ICD-10-CM | POA: Diagnosis not present

## 2017-04-07 DIAGNOSIS — R569 Unspecified convulsions: Secondary | ICD-10-CM | POA: Diagnosis present

## 2017-04-07 LAB — URINALYSIS, ROUTINE W REFLEX MICROSCOPIC
Bilirubin Urine: NEGATIVE
GLUCOSE, UA: NEGATIVE mg/dL
Hgb urine dipstick: NEGATIVE
Ketones, ur: NEGATIVE mg/dL
LEUKOCYTES UA: NEGATIVE
Nitrite: NEGATIVE
PROTEIN: NEGATIVE mg/dL
Specific Gravity, Urine: 1.021 (ref 1.005–1.030)
pH: 7 (ref 5.0–8.0)

## 2017-04-07 LAB — CBC WITH DIFFERENTIAL/PLATELET
BASOS PCT: 0 %
Basophils Absolute: 0 10*3/uL (ref 0.0–0.1)
Eosinophils Absolute: 0 10*3/uL (ref 0.0–0.7)
Eosinophils Relative: 1 %
HEMATOCRIT: 45.6 % (ref 39.0–52.0)
HEMOGLOBIN: 15.5 g/dL (ref 13.0–17.0)
LYMPHS PCT: 37 %
Lymphs Abs: 2.3 10*3/uL (ref 0.7–4.0)
MCH: 29.8 pg (ref 26.0–34.0)
MCHC: 34 g/dL (ref 30.0–36.0)
MCV: 87.5 fL (ref 78.0–100.0)
MONOS PCT: 6 %
Monocytes Absolute: 0.4 10*3/uL (ref 0.1–1.0)
NEUTROS ABS: 3.4 10*3/uL (ref 1.7–7.7)
NEUTROS PCT: 56 %
Platelets: 192 10*3/uL (ref 150–400)
RBC: 5.21 MIL/uL (ref 4.22–5.81)
RDW: 12.8 % (ref 11.5–15.5)
WBC: 6.1 10*3/uL (ref 4.0–10.5)

## 2017-04-07 LAB — COMPREHENSIVE METABOLIC PANEL
ALBUMIN: 4.4 g/dL (ref 3.5–5.0)
ALK PHOS: 72 U/L (ref 38–126)
ALT: 15 U/L — ABNORMAL LOW (ref 17–63)
AST: 17 U/L (ref 15–41)
Anion gap: 6 (ref 5–15)
BILIRUBIN TOTAL: 1.5 mg/dL — AB (ref 0.3–1.2)
BUN: 13 mg/dL (ref 6–20)
CALCIUM: 9.6 mg/dL (ref 8.9–10.3)
CO2: 26 mmol/L (ref 22–32)
Chloride: 103 mmol/L (ref 101–111)
Creatinine, Ser: 0.91 mg/dL (ref 0.61–1.24)
GFR calc Af Amer: >60 mL/min (ref >60)
GLUCOSE: 72 mg/dL (ref 65–99)
Potassium: 4 mmol/L (ref 3.5–5.1)
Sodium: 135 mmol/L (ref 135–145)
TOTAL PROTEIN: 7.4 g/dL (ref 6.5–8.1)

## 2017-04-07 LAB — RAPID URINE DRUG SCREEN, HOSP PERFORMED
AMPHETAMINES: NOT DETECTED
BENZODIAZEPINES: NOT DETECTED
Barbiturates: NOT DETECTED
COCAINE: NOT DETECTED
OPIATES: NOT DETECTED
Tetrahydrocannabinol: NOT DETECTED

## 2017-04-07 LAB — MAGNESIUM: Magnesium: 1.9 mg/dL (ref 1.7–2.4)

## 2017-04-07 LAB — CBG MONITORING, ED: Glucose-Capillary: 75 mg/dL (ref 65–99)

## 2017-04-07 LAB — ACETAMINOPHEN LEVEL

## 2017-04-07 LAB — ETHANOL

## 2017-04-07 LAB — SALICYLATE LEVEL: Salicylate Lvl: <7 mg/dL (ref 2.8–30.0)

## 2017-04-07 MED ORDER — OXCARBAZEPINE 300 MG PO TABS
150.0000 mg | ORAL_TABLET | Freq: Two times a day (BID) | ORAL | Status: DC
  Administered 2017-04-07: 150 mg via ORAL
  Filled 2017-04-07: qty 1

## 2017-04-07 MED ORDER — FLUOXETINE HCL 20 MG PO TABS
20.0000 mg | ORAL_TABLET | Freq: Every day | ORAL | Status: DC
  Administered 2017-04-07: 20 mg via ORAL
  Filled 2017-04-07: qty 1

## 2017-04-07 NOTE — ED Notes (Signed)
TTS in room and pt on call with [facility].

## 2017-04-07 NOTE — Telephone Encounter (Signed)
Patient wife called and states that patient will not make his appt today at 2:30 due to patient having 2 seizures in the past 24 hours. The last one just happened and the yare in the ED as we speak. Please call patient wife today she really needs to talk to someone she said

## 2017-04-07 NOTE — ED Provider Notes (Signed)
[facility] EMERGENCY DEPARTMENT Provider Note   CSN: [id number] Arrival date & time: 04/07/17  1103     History   Chief Complaint Chief Complaint  Patient presents with  . Fatigue    HPI Franklin [name] is a 36 y.o. male.  The history is provided by a relative. No language interpreter was used.    Franklin [name] is a 36 y.o. male who presents to the Emergency Department complaining of seizures.  Level V caveat due to altered mental status.  History is provided by the patient's mother.  She states that he had seizure-like activity 3 times prior to arrival.  He was at his grandmother's house when she reported one episode of seizure-like activity.  He had a second 1 at the bus stop and a third episode in the lobby waiting to be seen.  She states that when he has these episodes he has gazing to the left and is noncommunicative.  He was supposed to see neurology at 230 today but was unable to make the appointment.  He also has a history of schizophrenia and bipolar disorder.  She does not know of any trauma or recent illnesses.  Past Medical History:  Diagnosis Date  . Bipolar 1 disorder ([place])   . Chronic headaches   . Conversion disorder with seizures or convulsions   . 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 Depression   . Hx of electroencephalogram 12/2014   normal  . Mild cognitive impairment   . Psychogenic nonepileptic seizure   . Schizophrenic disorder ([place])   . Seizures [facility])     Patient Active Problem List   Diagnosis Date Noted  . Psychogenic nonepileptic seizure 02/18/2017  . Cholelithiasis 01/24/2017  . 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 Abnormal EKG 11/30/2015  . Elevated troponin 11/30/2015  . Bipolar affective disorder, most recent episode unspecified type, remission status unspecified 03/11/2015  . Bipolar affective disorder ([place])  01/08/2015  . Generalized idiopathic epilepsy and epileptic syndromes, without status epilepticus, not intractable ([place]) 11/27/2014  . Seizure disorder ([place]) 10/14/2014    No past surgical history on file.     Home Medications    Prior to Admission medications   Medication Sig Start Date End Date Taking? Authorizing Provider  FLUoxetine (PROZAC) 20 MG tablet Take 20 mg daily by mouth.   Yes [provider]  OXcarbazepine (TRILEPTAL) 150 MG tablet Take 1 tablet (150 mg total) by mouth 2 (two) times daily. 10/20/16  Yes Cameron Sprang, MD  cetirizine (ZYRTEC) 10 MG tablet Take 1 tablet (10 mg total) by mouth daily. Patient not taking: Reported on 04/06/2017 02/18/17   Arnoldo Morale, MD    Family History Family History  Problem Relation Age of Onset  . Heart disease Mother     Social History Social History   Tobacco Use  . Smoking status: Never Smoker  . Smokeless tobacco: Never Used  Substance Use Topics  . Alcohol use: Yes    Alcohol/week: 0.0 oz    Comment: 40oz today  . Drug use: No     Allergies   Coconut oil; Other; Codeine; Depakote [divalproex sodium]; Penicillins; and Tape   Review of Systems Review of Systems  All other systems reviewed and are negative.    Physical Exam Updated Vital Signs BP 124/80   Pulse (!) 50   Temp 98.3 F (36.8 C) (Oral)  Resp 16   SpO2 100%   Physical Exam  Constitutional: He appears well-developed and well-nourished.  HENT:  Head: Normocephalic and atraumatic.  Eyes: EOM are normal. Pupils are equal, round, and reactive to light.  Cardiovascular: Regular rhythm.  No murmur heard. bradycardic  Pulmonary/Chest: Effort normal and breath sounds normal. No respiratory distress.  Abdominal: Soft. There is no tenderness. There is no rebound and no guarding.  Musculoskeletal: He exhibits no edema or tenderness.  Neurological: He is alert.  Head and eyes turned to the left.  On administration of ammonia patient  jerks his head to the right and blinks and briefly moves all 4 extremities.  On removal of ammonia patient returns his gaze to the left with frequent blinking and tearing.  Skin: Skin is warm and dry.  Psychiatric:  Unable to assess  Nursing note and vitals reviewed.    ED Treatments / Results  Labs (all labs ordered are listed, but only abnormal results are displayed) Labs Reviewed  ACETAMINOPHEN LEVEL - Abnormal; Notable for the following components:      Result Value   Acetaminophen (Tylenol), Serum <10 (*)    All other components within normal limits  COMPREHENSIVE METABOLIC PANEL - Abnormal; Notable for the following components:   ALT 15 (*)    Total Bilirubin 1.5 (*)    All other components within normal limits  ETHANOL  URINALYSIS, ROUTINE W REFLEX MICROSCOPIC  RAPID URINE DRUG SCREEN, HOSP PERFORMED  SALICYLATE LEVEL  CBC WITH DIFFERENTIAL/PLATELET  MAGNESIUM  CBG MONITORING, ED    EKG  EKG Interpretation None       Radiology Dg Chest 2 View  Result Date: 04/06/2017 CLINICAL DATA:  Seizure, fall and altered mental status. EXAM: CHEST  2 VIEW COMPARISON:  10/23/2016 and prior radiographs FINDINGS: Cardiomegaly again noted. There is no evidence of focal airspace disease, pulmonary edema, suspicious pulmonary nodule/mass, pleural effusion, or pneumothorax. No acute bony abnormalities are identified. IMPRESSION: Cardiomegaly without evidence of acute cardiopulmonary disease. Electronically Signed   By: Margarette Canada M.D.   On: 04/06/2017 19:14   Dg Cervical Spine Complete  Result Date: 04/06/2017 CLINICAL DATA:  Fall.  Seizure.  Pain in the cervical region. EXAM: CERVICAL SPINE - COMPLETE 4+ VIEW COMPARISON:  CT scan from 02/17/2017 FINDINGS: A cervical collar is in place. The T1 vertebral body is obscured by the patient' s shoulders despite the use of a swimmer's projection. The alignment down the C7 is normal. No prevertebral soft tissue swelling or appreciable fracture  or in-collar subluxation. Patient seems to have dental cavities. Uncinate spurring bilaterally at C6-7. IMPRESSION: 1. No cervical spine fracture or in-collar instability is identified. 2. We were unable to see the T1 level well on lateral projections despite the swimmer's view attempt. 3. Dental cavities. 4. Uncinate spurring bilaterally at C6-7. Electronically Signed   By: Van Clines M.D.   On: 04/06/2017 19:16    Procedures Procedures (including critical care time)  Medications Ordered in ED Medications  FLUoxetine (PROZAC) tablet 20 mg (not administered)  Oxcarbazepine (TRILEPTAL) tablet 150 mg (not administered)     Initial Impression / Assessment and Plan / ED Course  I have reviewed the triage vital signs and the nursing notes.  Pertinent labs & imaging results that were available during my care of the patient were reviewed by me and considered in my medical decision making (see chart for details).     Pt with hx/o psychiatric disease here with seizure episodes x 3 prior  to ED arrival.  Pt brought to room due to seizure activity in lobby.  On exam pt without seizure activity(activity terminates with ammonia administration).  Current clinical picture is not c/w status epilepticus.  Discussed with patient pseudoseizures and likely that he is under increased psychologic stress.  He has also been evaluated by his neurologist, Dr. Delice Lesch who feels that these are nonepileptic seizures as well.  He has been medically cleared for psychiatric evaluation and treatment.  Final Clinical Impressions(s) / ED Diagnoses   Final diagnoses:  None    ED Discharge Orders    None       Quintella Reichert, MD 04/07/17 [phone]

## 2017-04-07 NOTE — ED Notes (Signed)
Per phone call from [facility], NP will recommend discharge. MD notified.

## 2017-04-07 NOTE — ED Triage Notes (Signed)
[facility]- pt here [place] bus stop, pt was here last night for possible syncopal episode. Pt today began having generalized fatigue. Vitals stable with EMS: 130/90, HR 60, RR 16, CBG 92. Pt AOX4.

## 2017-04-07 NOTE — ED Notes (Signed)
ED Provider at bedside. 

## 2017-04-07 NOTE — Telephone Encounter (Signed)
Pls see what he needs, his seizures are psychological, he may be under more stress, he needs to see a psychiatrist. Thanks

## 2017-04-07 NOTE — BH Assessment (Signed)
Tele Assessment Note   Patient Name: Franklin [name] MRN: [id number] Referring Physician: Dr. Ralene Bathe Location of Patient: [facility] Location of Provider: [place] is an 36 y.o. male. Pt reports increased seizures. Pt denies SI/HI and AVH. Pt reports Schizoaffective but states he is currently stablized. Per Pt he is seen at [facility] for therapy and medication management. Pt states he is compliant with his medication. Pt states he has an appointment at [facility]. Pt states he has a neurologist and he has an appointment on 04/18/17 for his seizures. Pt states he wants to speak with his neurologist about his increased seizures.   Pt's wife Ronn Smolinsky stated that she does not feel that the Pt has a current mental health need or concern. Mrs. Marquard states that she and the Pt are concerned about the Pt's physical health and will follow-up with the Pt's neurologist.   Delphia Grates, NP recommend D/C and follow-up with current providers.  Diagnosis:  F25.0 Schizoaffective disorder, Bipolar type  Past Medical History:  Past Medical History:  Diagnosis Date  . Bipolar 1 disorder ([place])   . Chronic headaches   . Conversion disorder with seizures or convulsions   . 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 Depression   . Hx of electroencephalogram 12/2014   normal  . Mild cognitive impairment   . Psychogenic nonepileptic seizure   . Schizophrenic disorder ([place])   . Seizures ([place])     No past surgical history on file.  Family History:  Family History  Problem Relation Age of Onset  . Heart disease Mother     Social History:  reports that  has never smoked. he has never used smokeless tobacco. He reports that he drinks alcohol. He reports that he does not use drugs.  Additional Social History:  Alcohol / Drug Use Pain Medications: please see mar Prescriptions: please see  mar Over the Counter: please see mar History of alcohol / drug use?: No history of alcohol / drug abuse Longest period of sobriety (when/how long): NA  CIWA: CIWA-Ar BP: 124/80 Pulse Rate: (!) 50 COWS:    PATIENT STRENGTHS: (choose at least two) Average or above average intelligence Communication skills  Allergies:  Allergies  Allergen Reactions  . Coconut Oil Anaphylaxis and Nausea And Vomiting    Reaction to any kind of coconut  . Other Anaphylaxis    raisin  . Codeine Nausea And Vomiting  . Depakote [Divalproex Sodium] Other (See Comments)    Pt states that this medication makes him feel like he is out of it.   Marland Kitchen Penicillins Nausea And Vomiting    Has patient had a PCN reaction causing immediate rash, facial/tongue/throat swelling, SOB or lightheadedness with hypotension: No Has patient had a PCN reaction causing severe rash involving mucus membranes or skin necrosis: No Has patient had a PCN reaction that required hospitalization No Has patient had a PCN reaction occurring within the last 10 years: No If all of the above answers are "NO", then may proceed with Cephalosporin use.    . Tape Other (See Comments)    Reaction:  Tears skin, Pt is able to use paper tape.      Home Medications:  (Not in a hospital admission)  OB/GYN Status:  No LMP for male patient.  General Assessment Data Location of Assessment: [facility] ED TTS Assessment: In system Is this a Tele or Face-to-Face Assessment?:  Tele Assessment Is this an Initial Assessment or a Re-assessment for this encounter?: Initial Assessment Marital status: Single Maiden name: Na Is patient pregnant?: No Pregnancy Status: No Living Arrangements: Spouse/significant other Can pt return to current living arrangement?: Yes Admission Status: Voluntary Is patient capable of signing voluntary admission?: Yes Referral Source: Self/Family/Friend Insurance type: [place]     Crisis Care Plan Living Arrangements:  Spouse/significant other Legal Guardian: Other:(self) Name of Psychiatrist: NA Name of Therapist: NA  Education Status Is patient currently in school?: No  Risk to self with the past 6 months Suicidal Ideation: No Has patient been a risk to self within the past 6 months prior to admission? : No Suicidal Intent: No Has patient had any suicidal intent within the past 6 months prior to admission? : No Is patient at risk for suicide?: No Suicidal Plan?: No Has patient had any suicidal plan within the past 6 months prior to admission? : No Access to Means: No What has been your use of drugs/alcohol within the last 12 months?: NA Previous Attempts/Gestures: No How many times?: 0 Other Self Harm Risks: NA Triggers for Past Attempts: None known Intentional Self Injurious Behavior: None Family Suicide History: No Recent stressful life event(s): Other (Comment)(none known) Persecutory voices/beliefs?: No Depression: No Depression Symptoms: (Pt denies) Substance abuse history and/or treatment for substance abuse?: No Suicide prevention information given to non-admitted patients: Not applicable  Risk to Others within the past 6 months Homicidal Ideation: No Does patient have any lifetime risk of violence toward others beyond the six months prior to admission? : No Thoughts of Harm to Others: No Current Homicidal Intent: No Current Homicidal Plan: No Access to Homicidal Means: No Identified Victim: NA History of harm to others?: No Assessment of Violence: None Noted Violent Behavior Description: NA Does patient have access to weapons?: No Criminal Charges Pending?: No Does patient have a court date: No Is patient on probation?: No  Psychosis Hallucinations: None noted Delusions: None noted  Mental Status Report Appearance/Hygiene: Unremarkable Eye Contact: Fair Motor Activity: Freedom of movement Speech: Logical/coherent Level of Consciousness: Alert Mood:  Euthymic Affect: Appropriate to circumstance Anxiety Level: Minimal Thought Processes: Coherent, Relevant Judgement: Unimpaired Orientation: Person, Place, Time, Situation Obsessive Compulsive Thoughts/Behaviors: None  Cognitive Functioning Concentration: Normal Memory: Recent Intact, Remote Intact IQ: Average Insight: Fair Impulse Control: Fair Appetite: Fair Weight Loss: 0 Weight Gain: 0 Sleep: No Change Total Hours of Sleep: 8 Vegetative Symptoms: None  ADLScreening [facility] Assessment Services) Patient's cognitive ability adequate to safely complete daily activities?: Yes Patient able to express need for assistance with ADLs?: Yes  Prior Inpatient Therapy Prior Inpatient Therapy: Yes Prior Therapy Dates: unknown Prior Therapy Facilty/Provider(s): unknown Reason for Treatment: Schizophrenia  Prior Outpatient Therapy Prior Outpatient Therapy: Yes Prior Therapy Dates: current Prior Therapy Facilty/Provider(s): [facility] Reason for Treatment: Schizophrenia Does patient have an ACCT team?: No Does patient have Intensive In-House Services?  : No Does patient have [facility] services? : No Does patient have P4CC services?: No  ADL Screening (condition at time of admission) Patient's cognitive ability adequate to safely complete daily activities?: Yes Is the patient deaf or have difficulty hearing?: No Does the patient have difficulty seeing, even when wearing glasses/contacts?: No Patient able to express need for assistance with ADLs?: Yes Does the patient have difficulty dressing or bathing?: No Does the patient have difficulty walking or climbing stairs?: No Weakness of Legs: None Weakness of Arms/Hands: None       Abuse/Neglect Assessment (Assessment to  be complete while patient is alone) Abuse/Neglect Assessment Can Be Completed: Yes Physical Abuse: Denies Verbal Abuse: Denies Exploitation of patient/patient's resources: Denies Self-Neglect: Denies     Armed forces training and education officer (For Healthcare) Does Patient Have a Medical Advance Directive?: No Would patient like information on creating a medical advance directive?: No - Patient declined    Additional Information 1:1 In Past 12 Months?: No CIRT Risk: No Elopement Risk: No Does patient have medical clearance?: Yes     Disposition:  Disposition Initial Assessment Completed for this Encounter: Yes Disposition of Patient: Outpatient treatment Type of outpatient treatment: Adult  This service was provided via telemedicine using a 2-way, interactive audio and video technology.  Names of all persons participating in this telemedicine service and their role in this encounter. Name: Obrien Huskins Role: Wife  Name: Earleen Newport Role: NP  Name:  Role:   Name:  Role:     Cyndia Bent 04/07/2017 4:47 PM

## 2017-04-18 ENCOUNTER — Ambulatory Visit (INDEPENDENT_AMBULATORY_CARE_PROVIDER_SITE_OTHER): Payer: Medicaid Other | Admitting: Neurology

## 2017-04-18 ENCOUNTER — Encounter: Payer: Self-pay | Admitting: Neurology

## 2017-04-18 VITALS — BP 110/74 | HR 70 | Ht 72.0 in | Wt 164.0 lb

## 2017-04-18 DIAGNOSIS — F445 Conversion disorder with seizures or convulsions: Secondary | ICD-10-CM | POA: Diagnosis not present

## 2017-04-18 DIAGNOSIS — F319 Bipolar disorder, unspecified: Secondary | ICD-10-CM | POA: Diagnosis not present

## 2017-04-18 MED ORDER — OXCARBAZEPINE 300 MG PO TABS: ORAL_TABLET | ORAL | 6 refills | Status: DC

## 2017-04-18 NOTE — Patient Instructions (Signed)
1. Increase oxcarbazepine 300mg : take 1/2 tablet in AM, 1 tablet in PM 2. Continue follow-up with psychiatrist and therapist.If your psychiatrist wants to increase oxcarbazepine for the bipolar disorder, please let them go ahead. Ask your therapist to do Cognitive Behavioral Therapy with you, this has been found to be helpful for stress seizures 3. Follow-up in 6 months, call for any changes

## 2017-04-18 NOTE — Progress Notes (Signed)
NEUROLOGY FOLLOW UP OFFICE NOTE  Franklin [name] [id number]  DOB: 03/05/2016  HISTORY OF PRESENT ILLNESS: I had the pleasure of seeing Franklin [name] in follow-up in the neurology clinic on 04/19/2017. 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 He is accompanied by his wife who helps supplement the history today. Since his last visit, there have been 5 ER visits for seizures. He was in the ER on 11/7, then again on 11/8 for back to back seizures. It was noted that his head and eyes turned to the left, then with administration of ammonia, patient jerked his head to the right and blinked and briefly moved all 4 extremities. On removal of ammonia, he returns his gaze to the left with frequent blinking and tearing. His wife reports that the seizures she has witnessed consists of patient blanking out, he does not talk, then he "starts shaking all over the place." Sometimes his eyes are closed. If they are open, he is just dazed out of it. He was monitored at [facility] for several hours, but he became progressively agitated and uncooperative. He did have 3 typical spells confirmed by his cousin as typical events. He was kicking his legs, unresponsive, staring off, flailing around the bed during one event. The second episode he was noted to have right hand shaking, trying to get out of bed, fidgeting around the bed. The third push button was for fighting the nurses and getting agitated, pulling his IV out. All three episodes did not show any EEG correlate. It was noted that it is unclear whether all his spells are non-epileptic, however baseline uncontrolled psychosis made him unable to comply with [name] monitoring to continue testing. He now follows with [facility] for psychiatry and therapy, and is taking Prozac. He is on Oxcarbazepine 150mg  BID for mood stabilization (he reported pain on 300mg  BID).  HPI: This is a 36 yo RH man with a history of bipolar disorder, mild cognitive  impairment, and seizures since age 36 or 70. He and his cousin both report that seizures would start with staring and unresponsiveness, followed by low amplitude shaking of both hands. Sometimes he would have violent leg kicking. They state that shaking last from 30 minutes up to "1-1/2 hours" followed by confusion for around 20 minutes. He would be sleepy after a seizure. He has had urinary incontinence with some of them, no tongue bite or other significant injuries. He reports all seizures occur during wakefulness, no nocturnal seizures that he is aware of, but woke up one time with urinary incontinence. He denies any olfactory/gustatory hallucinations, deja vu, rising epigastric sensation, focal numbness/tingling/weakness, myoclonic jerks. He occasionally drops things from his hands. He reports that Depakote was started at age 3, however he has only been taking 250mg /day, stating that when he was on 500 or 750mg , he would be "like a zombie, walking into doors." The low dose Depakote has not controlled the seizures, he reports seizures occurring every 3-4 days, longest seizure-free interval probably 1-2 months.   On review of records on EPIC, he has been to the ER several times since 2008 with no note of a seizure history until 2014. There are some records of patient being on Depakote, Tegretol, and Haldol, presumably for Bipolar disorder. He reports chronic headaches occurring 2-3 times a week with sharp pain in the back of his head, "like a needle sticking in it" lasting 30-45 minutes. He would feel lightheaded. No associated nausea, vomiting, photo/phonophobia, or visual  obscurations. He takes Tylenol or aspirin with good effect. He lives with his girlfriend. He finished 12th grade in special education classes. He does not drive.  Epilepsy Risk Factors: His maternal uncle used to have seizures. He was born premature, with mild cognitive impairment. Otherwise he denies any history of febrile convulsions,  CNS infections such as meningitis/encephalitis, significant traumatic brain injury, neurosurgical procedures.  PAST MEDICAL HISTORY: Past Medical History:  Diagnosis Date  . Bipolar 1 disorder ([place])   . Chronic headaches   . Conversion disorder with seizures or convulsions   . 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 Depression   . Hx of electroencephalogram 12/2014   normal  . Mild cognitive impairment   . Psychogenic nonepileptic seizure   . Schizophrenic disorder ([place])   . Seizures [facility])     MEDICATIONS:   Outpatient Encounter Medications as of 04/18/2017  Medication Sig  . cetirizine (ZYRTEC) 10 MG tablet Take 1 tablet (10 mg total) by mouth daily. (Patient not taking: Reported on 04/06/2017)  . FLUoxetine (PROZAC) 20 MG tablet Take 20 mg daily by mouth.  . OXcarbazepine (TRILEPTAL) 150 MG tablet Take 1 tablet (150 mg total) by mouth 2 (two) times daily.   No facility-administered encounter medications on file as of 04/18/2017.      ALLERGIES: Allergies  Allergen Reactions  . Coconut Oil Anaphylaxis and Nausea And Vomiting    Reaction to any kind of coconut  . Other Anaphylaxis    raisin  . Codeine Nausea And Vomiting  . Depakote [Divalproex Sodium] Other (See Comments)    Pt states that this medication makes him feel like he is out of it.   Marland Kitchen Penicillins Nausea And Vomiting    Has patient had a PCN reaction causing immediate rash, facial/tongue/throat swelling, SOB or lightheadedness with hypotension: No Has patient had a PCN reaction causing severe rash involving mucus membranes or skin necrosis: No Has patient had a PCN reaction that required hospitalization No Has patient had a PCN reaction occurring within the last 10 years: No If all of the above answers are "NO", then may proceed with Cephalosporin use.    . Tape Other (See Comments)    Reaction:  Tears skin, Pt is able to use  paper tape.      FAMILY HISTORY: Family History  Problem Relation Age of Onset  . Heart disease Mother     SOCIAL HISTORY: Social History   Socioeconomic History  . Marital status: Single    Spouse name: Not on file  . Number of children: Not on file  . Years of education: Not on file  . Highest education level: Not on file  Social Needs  . Financial resource strain: Not on file  . Food insecurity - worry: Not on file  . Food insecurity - inability: Not on file  . Transportation needs - medical: Not on file  . Transportation needs - non-medical: Not on file  Occupational History  . Not on file  Tobacco Use  . Smoking status: Never Smoker  . Smokeless tobacco: Never Used  Substance and Sexual Activity  . Alcohol use: Yes    Alcohol/week: 0.0 oz    Comment: 40oz today  . Drug use: No  . Sexual activity: Not Currently    Birth control/protection: Condom  Other Topics Concern  . Not on file  Social History Narrative  . Not on file  REVIEW OF SYSTEMS: Constitutional: No fevers, chills, or sweats, no generalized fatigue, change in appetite Eyes: No visual changes, double vision, eye pain Ear, nose and throat: No hearing loss, ear pain, nasal congestion, sore throat Cardiovascular: No chest pain, palpitations Respiratory:  No shortness of breath at rest or with exertion, wheezes GastrointestinaI: No nausea, vomiting, diarrhea, abdominal pain, fecal incontinence Genitourinary:  No dysuria, urinary retention Musculoskeletal:  No neck pain, back pain Integumentary: No rash, pruritus, skin lesions Neurological: as above Psychiatric: No depression, insomnia, anxiety Endocrine: No palpitations, fatigue, diaphoresis, mood swings, change in appetite, change in weight, increased thirst Hematologic/Lymphatic:  No anemia, purpura, petechiae. Allergic/Immunologic: no itchy/runny eyes, nasal congestion, recent allergic reactions, rashes  PHYSICAL EXAM: Vitals:   04/18/17  1537  BP: 110/74  Pulse: 70  SpO2: 99%   General: No acute distress Head:  Normocephalic/atraumatic Neck: supple, no paraspinal tenderness, full range of motion Heart:  Regular rate and rhythm Lungs:  Clear to auscultation bilaterally Back: No paraspinal tenderness Skin/Extremities: No rash, no edema Neurological Exam: alert and oriented to person, place, and time. No aphasia or dysarthria. Fund of knowledge is appropriate.  Recent and remote memory are intact.  Attention and concentration are normal.    Able to name objects and repeat phrases. Cranial nerves: Pupils equal, round, reactive to light.  Extraocular movements intact with no nystagmus. Visual fields full. Facial sensation intact. No facial asymmetry. Tongue, uvula, palate midline.  Motor: Bulk and tone normal, muscle strength 5/5 throughout with no pronator drift.  Sensation to light touch intact.  No extinction to double simultaneous stimulation.  Deep tendon reflexes 2+ throughout, toes downgoing.  Finger to nose testing intact.  Gait wide-based, favoring right leg (similar to prior), no ataxia. Romberg negative.  IMPRESSION: This is a 36 yo RH man with a history of bipolar disorder, mild cognitive impairment, with a report of history of seizures since age 57 or 33. Review of records from ER visits in the past have no indication of seizures or seizure medication until 2014. He was admitted at [facility] for vEEG monitoring but only stayed for a few hours due to increasing agitation. He did have 3 psychogenic non-epileptic events (PNES) with staring/unresponsiveness, right sided shaking, and shaking/flailing on bed. It is most likely that his seizures are all non-epileptic, however a complete [name] stay could not be done due to aggressive behavior. He is accompanied today by his wife, he has been to the ER 5 times in the past 6 months for seizure-like activity. I had an extensive discussion with his wife and again with the patient about  psychogenic non-epileptic events (PNES), and gave them educational resources/reading material about the diagnosis. He is on oxcarbazepine for mood stabilization, I will increase dose to 150mg  in AM, 300mg  in PM, and if his psychiatrist feels a higher dose will be more helpful, this will be deferred to them. Cognitive behavioral therapy is recommended for PNES. He does not drive. He will follow-up in 6 months and knows to call our office for any changes.   Thank you for allowing me to participate in his care.  Please do not hesitate to call for any questions or concerns.  The duration of this appointment visit was 25 minutes of face-to-face time with the patient.  Greater than 50% of this time was spent in counseling, explanation of diagnosis, planning of further management, and coordination of care.   Ellouise Newer, M.D.  CC: Dr. Jarold Song

## 2017-04-27 ENCOUNTER — Ambulatory Visit: Payer: Self-pay | Admitting: Family Medicine

## 2017-04-28 ENCOUNTER — Emergency Department (HOSPITAL_COMMUNITY)
Admission: EM | Admit: 2017-04-28 | Discharge: 2017-04-29 | Disposition: A | Discharge status: Home / Self Care | Payer: Medicaid Other | Attending: Emergency Medicine | Admitting: Emergency Medicine

## 2017-04-28 ENCOUNTER — Encounter (HOSPITAL_COMMUNITY): Payer: Self-pay | Admitting: Emergency Medicine

## 2017-04-28 DIAGNOSIS — R569 Unspecified convulsions: Secondary | ICD-10-CM | POA: Diagnosis not present

## 2017-04-28 DIAGNOSIS — Z79899 Other long term (current) drug therapy: Secondary | ICD-10-CM | POA: Insufficient documentation

## 2017-04-28 LAB — BASIC METABOLIC PANEL
Anion gap: 4 — ABNORMAL LOW (ref 5–15)
BUN: 12 mg/dL (ref 6–20)
CALCIUM: 7.6 mg/dL — AB (ref 8.9–10.3)
CO2: 23 mmol/L (ref 22–32)
CREATININE: 0.72 mg/dL (ref 0.61–1.24)
Chloride: 112 mmol/L — ABNORMAL HIGH (ref 101–111)
Glucose, Bld: 83 mg/dL (ref 65–99)
Potassium: 3 mmol/L — ABNORMAL LOW (ref 3.5–5.1)
Sodium: 139 mmol/L (ref 135–145)

## 2017-04-28 LAB — URINALYSIS, ROUTINE W REFLEX MICROSCOPIC
Bilirubin Urine: NEGATIVE
GLUCOSE, UA: NEGATIVE mg/dL
HGB URINE DIPSTICK: NEGATIVE
KETONES UR: NEGATIVE mg/dL
Leukocytes, UA: NEGATIVE
Nitrite: NEGATIVE
PROTEIN: NEGATIVE mg/dL
Specific Gravity, Urine: 1.014 (ref 1.005–1.030)
pH: 7 (ref 5.0–8.0)

## 2017-04-28 LAB — CBC WITH DIFFERENTIAL/PLATELET
BASOS PCT: 0 %
Basophils Absolute: 0 10*3/uL (ref 0.0–0.1)
EOS ABS: 0 10*3/uL (ref 0.0–0.7)
Eosinophils Relative: 1 %
HCT: 39.2 % (ref 39.0–52.0)
Hemoglobin: 13.7 g/dL (ref 13.0–17.0)
Lymphocytes Relative: 25 %
Lymphs Abs: 1.5 10*3/uL (ref 0.7–4.0)
MCH: 30.1 pg (ref 26.0–34.0)
MCHC: 34.9 g/dL (ref 30.0–36.0)
MCV: 86.2 fL (ref 78.0–100.0)
MONO ABS: 0.4 10*3/uL (ref 0.1–1.0)
MONOS PCT: 7 %
NEUTROS PCT: 67 %
Neutro Abs: 4 10*3/uL (ref 1.7–7.7)
PLATELETS: 160 10*3/uL (ref 150–400)
RBC: 4.55 MIL/uL (ref 4.22–5.81)
RDW: 12.3 % (ref 11.5–15.5)
WBC: 6 10*3/uL (ref 4.0–10.5)

## 2017-04-28 LAB — CBG MONITORING, ED: GLUCOSE-CAPILLARY: 93 mg/dL (ref 65–99)

## 2017-04-28 MED ORDER — POTASSIUM CHLORIDE CRYS ER 20 MEQ PO TBCR
40.0000 meq | EXTENDED_RELEASE_TABLET | Freq: Once | ORAL | Status: AC
Start: 2017-04-28 — End: 2017-04-28
  Administered 2017-04-28: 40 meq via ORAL
  Filled 2017-04-28: qty 2

## 2017-04-28 MED ORDER — OXCARBAZEPINE 150 MG PO TABS
150.0000 mg | ORAL_TABLET | Freq: Once | ORAL | Status: AC
  Administered 2017-04-28: 150 mg via ORAL
  Filled 2017-04-28: qty 1

## 2017-04-28 NOTE — Discharge Instructions (Signed)
Please read attached information regarding your condition. Continue your home medications as previously prescribed. Follow up with neurologist listed below for further evaluation. Return to ED for changes in seizure activity, head injuries, loss of consciousness, falls.

## 2017-04-28 NOTE — ED Notes (Signed)
Pt. Ambulated down the hall and back to his room without difficulty and steady gait.

## 2017-04-28 NOTE — ED Triage Notes (Signed)
Per EMS-states missed seizure medication this am-unwitnessed seizure at home-had a seizure with EMS-given 2.5mg  of Versed in route-patient was incontinent of urine-wife states patient has absent seizures

## 2017-04-28 NOTE — ED Notes (Signed)
Unable to collect labs patient wants the nurse to pull it from his IV

## 2017-04-28 NOTE — ED Provider Notes (Signed)
[place] DEPT Provider Note   CSN: [id number] Arrival date & time: 04/28/17  9678     History   Chief Complaint Chief Complaint  Patient presents with  . Seizures    HPI Franklin [name] is a 36 y.o. male with a past medical history of bipolar disorder, conversion disorder, psychogenic seizures, who presents to ED for evaluation of seizure that occurred prior to arrival.  His wife states that he had a jerking motion seizure which is typical for him.  She is concerned about his seizures although she was told not to bring him to ED for evaluation after every seizure. She is just worried that his medications are not helping him. He denies pain anywhere and denies injuries, loss of consciousness, abdominal pain, vomiting.  HPI  Past Medical History:  Diagnosis Date  . Bipolar 1 disorder ([place])   . Chronic headaches   . Conversion disorder with seizures or convulsions   . 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 Depression   . Hx of electroencephalogram 12/2014   normal  . Mild cognitive impairment   . Psychogenic nonepileptic seizure   . Schizophrenic disorder ([place])   . Seizures [facility])     Patient Active Problem List   Diagnosis Date Noted  . Psychogenic nonepileptic seizure 02/18/2017  . Cholelithiasis 01/24/2017  . 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 Abnormal EKG 11/30/2015  . Elevated troponin 11/30/2015  . Bipolar affective disorder, most recent episode unspecified type, remission status unspecified 03/11/2015  . Bipolar affective disorder ([place]) 01/08/2015  . Generalized idiopathic epilepsy and epileptic syndromes, without status epilepticus, not intractable ([place]) 11/27/2014  . Seizure disorder ([place]) 10/14/2014    History reviewed. No pertinent surgical history.     Home Medications    Prior to Admission medications     Medication Sig Start Date End Date Taking? Authorizing Provider  FLUoxetine (PROZAC) 20 MG tablet Take 20 mg daily by mouth.   Yes [provider]  OLANZapine (ZYPREXA) 15 MG tablet Take 15 mg by mouth at bedtime.   Yes [provider]  Oxcarbazepine (TRILEPTAL) 300 MG tablet Take 1/2 tablet in AM, 1 tablet in PM 04/18/17  Yes Cameron Sprang, MD  cetirizine (ZYRTEC) 10 MG tablet Take 1 tablet (10 mg total) by mouth daily. Patient not taking: Reported on 04/28/2017 02/18/17   Arnoldo Morale, MD  nystatin ointment (MYCOSTATIN) APPLY  TOPICALLY 2 (TWO) TIMES DAILY. 01/25/17   [provider]    Family History Family History  Problem Relation Age of Onset  . Heart disease Mother     Social History Social History   Tobacco Use  . Smoking status: Never Smoker  . Smokeless tobacco: Never Used  Substance Use Topics  . Alcohol use: Yes    Alcohol/week: 0.0 oz    Comment: 40oz today  . Drug use: No     Allergies   Coconut oil; Other; Codeine; Depakote [divalproex sodium]; Penicillins; and Tape   Review of Systems Review of Systems  Constitutional: Negative for appetite change, chills and fever.  HENT: Negative for ear pain, rhinorrhea, sneezing and sore throat.   Eyes: Negative for photophobia and visual disturbance.  Respiratory: Negative for cough, chest tightness, shortness of breath and wheezing.   Cardiovascular: Negative for chest pain and palpitations.  Gastrointestinal: Negative for abdominal pain, blood in stool, constipation, diarrhea, nausea and vomiting.  Genitourinary:  Negative for dysuria, hematuria and urgency.  Musculoskeletal: Negative for myalgias.  Skin: Negative for rash.  Neurological: Positive for seizures. Negative for dizziness, weakness and light-headedness.     Physical Exam Updated Vital Signs BP 107/87 (BP Location: Left Arm)   Pulse (!) 50   Temp 98.7 F (37.1 C) (Oral)   Resp 16   SpO2 98%   Physical Exam   Constitutional: He appears well-developed and well-nourished. No distress.  Nontoxic appearing and in no acute distress. Gazing to L side with head turned to L side.  HENT:  Head: Normocephalic and atraumatic.  Nose: Nose normal.  Eyes: Conjunctivae and EOM are normal. Pupils are equal, round, and reactive to light. Right eye exhibits no discharge. Left eye exhibits no discharge. No scleral icterus.  Neck: Normal range of motion. Neck supple.  Cardiovascular: Normal rate, regular rhythm, normal heart sounds and intact distal pulses. Exam reveals no gallop and no friction rub.  No murmur heard. Pulmonary/Chest: Effort normal and breath sounds normal. No respiratory distress.  Abdominal: Soft. Bowel sounds are normal. He exhibits no distension. There is no tenderness. There is no guarding.  Musculoskeletal: Normal range of motion. He exhibits no edema.  Neurological: He is alert. No cranial nerve deficit or sensory deficit. He exhibits normal muscle tone. Coordination normal.  Pupils reactive. No facial asymmetry noted. Cranial nerves appear grossly intact. Sensation intact to light touch on face, BUE and BLE. Strength 5/5 in BUE and BLE.  Skin: Skin is warm and dry. No rash noted.  Psychiatric: He has a normal mood and affect.  Nursing note and vitals reviewed.    ED Treatments / Results  Labs (all labs ordered are listed, but only abnormal results are displayed) Labs Reviewed  BASIC METABOLIC PANEL - Abnormal; Notable for the following components:      Result Value   Potassium 3.0 (*)    Chloride 112 (*)    Calcium 7.6 (*)    Anion gap 4 (*)    All other components within normal limits  CBC WITH DIFFERENTIAL/PLATELET  URINALYSIS, ROUTINE W REFLEX MICROSCOPIC  CBG MONITORING, ED    EKG  EKG Interpretation  Date/Time:  Thursday April 28 2017 19:03:16 EST Ventricular Rate:  47 PR Interval:    QRS Duration: 117 QT Interval:  448 QTC Calculation: 397 R Axis:   -18 Text  Interpretation:  Sinus bradycardia Nonspecific intraventricular conduction delay ST elev, probable normal early repol pattern No significant change since last tracing Confirmed by Lacretia Leigh ([id number]) on 04/28/2017 10:53:54 PM       Radiology No results found.  Procedures Procedures (including critical care time)  Medications Ordered in ED Medications  OXcarbazepine (TRILEPTAL) tablet 150 mg (150 mg Oral Given 04/28/17 2244)  potassium chloride SA (K-DUR,KLOR-CON) CR tablet 40 mEq (40 mEq Oral Given 04/28/17 2244)     Initial Impression / Assessment and Plan / ED Course  I have reviewed the triage vital signs and the nursing notes.  Pertinent labs & imaging results that were available during my care of the patient were reviewed by me and considered in my medical decision making (see chart for details).     Patient presents to ED for evaluation of seizures. He does have a history PNES and has been evaluated multiple times in the past for this. His wife states jerking motions after an aura of leftward gaze about twice today. She states this is typical of his usual seizures. He did not take his oxcarbazepine  today. He is evaluated by neurology. On my initial evaluation he was able to follow commands and was alert but did have his leftward gaze. Shortly thereafter, his wife arrived to ED. He was able to complete a neurological exam with no deficits noted. He has no signs of head or facial injuries noted. His labwork is unremarkable with the exception of mild hypokalemia at 3. Potassium repleted orally. EKG with no changes from previous tracings. He was given a dose of oxcarbazepine here. Ambulated without difficulty and able to tolerate PO. I suspect that his seizure was due to missing a dose of his home medications. Wife would like a referral to another neurologist, which I think is reasonable because they want his medications adjusted. No seizure activity noted here in the ED on today's  visit. Patient is stable for discharge at this time. Strict return precautions given.  Final Clinical Impressions(s) / ED Diagnoses   Final diagnoses:  Seizure-like activity [facility])    ED Discharge Orders    None       Delia Heady, PA-C 04/29/17 0000    Lacretia Leigh, MD 05/02/17 [phone]

## 2017-05-04 ENCOUNTER — Other Ambulatory Visit: Payer: Self-pay

## 2017-05-04 ENCOUNTER — Emergency Department (HOSPITAL_COMMUNITY)
Admission: EM | Admit: 2017-05-04 | Discharge: 2017-05-04 | Disposition: A | Discharge status: Home / Self Care | Payer: Medicaid Other | Attending: Emergency Medicine | Admitting: Emergency Medicine

## 2017-05-04 ENCOUNTER — Emergency Department (HOSPITAL_COMMUNITY): Payer: Medicaid Other

## 2017-05-04 DIAGNOSIS — F445 Conversion disorder with seizures or convulsions: Secondary | ICD-10-CM | POA: Insufficient documentation

## 2017-05-04 DIAGNOSIS — Z79899 Other long term (current) drug therapy: Secondary | ICD-10-CM | POA: Insufficient documentation

## 2017-05-04 DIAGNOSIS — R569 Unspecified convulsions: Secondary | ICD-10-CM | POA: Diagnosis present

## 2017-05-04 LAB — I-STAT CHEM 8, ED
BUN: 12 mg/dL (ref 6–20)
CREATININE: 0.9 mg/dL (ref 0.61–1.24)
Calcium, Ion: 1.19 mmol/L (ref 1.15–1.40)
Chloride: 104 mmol/L (ref 101–111)
GLUCOSE: 79 mg/dL (ref 65–99)
HCT: 43 % (ref 39.0–52.0)
HEMOGLOBIN: 14.6 g/dL (ref 13.0–17.0)
POTASSIUM: 3.6 mmol/L (ref 3.5–5.1)
Sodium: 140 mmol/L (ref 135–145)
TCO2: 25 mmol/L (ref 22–32)

## 2017-05-04 LAB — CBC
HEMATOCRIT: 41.6 % (ref 39.0–52.0)
Hemoglobin: 14.3 g/dL (ref 13.0–17.0)
MCH: 29.7 pg (ref 26.0–34.0)
MCHC: 34.4 g/dL (ref 30.0–36.0)
MCV: 86.3 fL (ref 78.0–100.0)
PLATELETS: 181 10*3/uL (ref 150–400)
RBC: 4.82 MIL/uL (ref 4.22–5.81)
RDW: 12.2 % (ref 11.5–15.5)
WBC: 5.2 10*3/uL (ref 4.0–10.5)

## 2017-05-04 LAB — CBG MONITORING, ED
GLUCOSE-CAPILLARY: 75 mg/dL (ref 65–99)
GLUCOSE-CAPILLARY: 104 mg/dL — AB (ref 65–99)

## 2017-05-04 NOTE — ED Notes (Signed)
Bed: WA03 Expected date:  Expected time:  Means of arrival:  Comments: EMS-seizure 

## 2017-05-04 NOTE — ED Provider Notes (Signed)
[place] DEPT Provider Note   CSN: [id number] Arrival date & time: 05/04/17  1659     History   Chief Complaint Chief Complaint  Patient presents with  . Seizures    HPI Nitish Byard is a 36 y.o. male.  Patient is a 36 year old male with a history of bipolar disorder, conversion disorder and nonepileptic seizures who presents with seizure activity.  His wife states that he recently started a new antidepressant medicine.  He was previously on Prozac and switched to a new medicine which he took the first dose today.  She said this afternoon she noticed him having twitching in his arms and his legs which is typical for his normal seizures.  He did not fall.  EMS was called and he was transported here.  She states that he is getting back to his baseline but still is pretty drowsy.  She denies that there is any other recent illnesses.  No fevers.  No cough or cold symptoms.  No vomiting.  No other recent medication changes.  He did see a neurologist with of our neurology recently who increased his dose of Trileptal.      Past Medical History:  Diagnosis Date  . Bipolar 1 disorder ([place])   . Chronic headaches   . Conversion disorder with seizures or convulsions   . 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 Depression   . Hx of electroencephalogram 12/2014   normal  . Mild cognitive impairment   . Psychogenic nonepileptic seizure   . Schizophrenic disorder ([place])   . Seizures [facility])     Patient Active Problem List   Diagnosis Date Noted  . Psychogenic nonepileptic seizure 02/18/2017  . Cholelithiasis 01/24/2017  . 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 Abnormal EKG 11/30/2015  . Elevated troponin 11/30/2015  . Bipolar affective disorder, most recent episode unspecified type, remission status unspecified 03/11/2015  . Bipolar affective disorder  ([place]) 01/08/2015  . Generalized idiopathic epilepsy and epileptic syndromes, without status epilepticus, not intractable ([place]) 11/27/2014  . Seizure disorder ([place]) 10/14/2014    No past surgical history on file.     Home Medications    Prior to Admission medications   Medication Sig Start Date End Date Taking? Authorizing Provider  OLANZapine (ZYPREXA) 15 MG tablet Take 15 mg by mouth at bedtime.   Yes [provider]  Oxcarbazepine (TRILEPTAL) 300 MG tablet Take 1/2 tablet in AM, 1 tablet in PM 04/18/17  Yes Cameron Sprang, MD  traZODone (DESYREL) 50 MG tablet Take 50-100 mg by mouth at bedtime as needed for sleep.   Yes [provider]  cetirizine (ZYRTEC) 10 MG tablet Take 1 tablet (10 mg total) by mouth daily. Patient not taking: Reported on 04/28/2017 02/18/17   Arnoldo Morale, MD  FLUoxetine (PROZAC) 20 MG tablet Take 20 mg daily by mouth.    [provider]  nystatin ointment (MYCOSTATIN) APPLY  TOPICALLY 2 (TWO) TIMES DAILY. 01/25/17   [provider]    Family History Family History  Problem Relation Age of Onset  . Heart disease Mother     Social History Social History   Tobacco Use  . Smoking status: Never Smoker  . Smokeless tobacco: Never Used  Substance Use Topics  . Alcohol use: Yes    Alcohol/week: 0.0 oz    Comment: 40oz today  . Drug use: No  Allergies   Coconut oil; Other; Codeine; Depakote [divalproex sodium]; Penicillins; and Tape   Review of Systems Review of Systems  Constitutional: Negative for chills, diaphoresis, fatigue and fever.  HENT: Negative for congestion, rhinorrhea and sneezing.   Eyes: Negative.   Respiratory: Negative for cough, chest tightness and shortness of breath.   Cardiovascular: Negative for chest pain and leg swelling.  Gastrointestinal: Negative for abdominal pain, blood in stool, diarrhea, nausea and vomiting.  Genitourinary: Negative for difficulty urinating, flank pain,  frequency and hematuria.  Musculoskeletal: Negative for arthralgias and back pain.  Skin: Negative for rash.  Neurological: Positive for seizures. Negative for dizziness, speech difficulty, weakness, numbness and headaches.     Physical Exam Updated Vital Signs BP 117/82 (BP Location: Right Arm)   Pulse (!) 46   Temp 98.9 F (37.2 C) (Oral)   Resp 12   Ht 5\' 9"  (1.753 m)   Wt 74.4 kg (164 lb)   SpO2 99%   BMI 24.22 kg/m   Physical Exam  Constitutional: He is oriented to person, place, and time. He appears well-developed and well-nourished.  HENT:  Head: Normocephalic and atraumatic.  Eyes: Pupils are equal, round, and reactive to light.  Neck: Normal range of motion. Neck supple.  Cardiovascular: Normal rate, regular rhythm and normal heart sounds.  Pulmonary/Chest: Effort normal and breath sounds normal. No respiratory distress. He has no wheezes. He has no rales. He exhibits no tenderness.  Abdominal: Soft. Bowel sounds are normal. There is no tenderness. There is no rebound and no guarding.  Musculoskeletal: Normal range of motion. He exhibits no edema.  Lymphadenopathy:    He has no cervical adenopathy.  Neurological: He is alert and oriented to person, place, and time. He has normal strength. No cranial nerve deficit or sensory deficit. GCS eye subscore is 4. GCS verbal subscore is 5. GCS motor subscore is 6.  Skin: Skin is warm and dry. No rash noted.  Psychiatric: His affect is blunt.     ED Treatments / Results  Labs (all labs ordered are listed, but only abnormal results are displayed) Labs Reviewed  CBG MONITORING, ED - Abnormal; Notable for the following components:      Result Value   Glucose-Capillary 104 (*)    All other components within normal limits  CBC  CBG MONITORING, ED  I-STAT CHEM 8, ED    EKG  EKG Interpretation  Date/Time:  Wednesday May 04 2017 22:12:34 EST Ventricular Rate:  47 PR Interval:    QRS Duration: 135 QT  Interval:  437 QTC Calculation: 387 R Axis:   -29 Text Interpretation:  Sinus bradycardia Nonspecific intraventricular conduction delay ST elev, probable normal early repol pattern Baseline wander in lead(s) V3 since last tracing no significant change Confirmed by Malvin Johns [phone]) on 05/04/2017 11:05:25 PM       Radiology Ct Head Wo Contrast  Result Date: 05/04/2017 CLINICAL DATA:  36 y/o  M; witnessed seizures. EXAM: CT HEAD WITHOUT CONTRAST TECHNIQUE: Contiguous axial images were obtained from the base of the skull through the vertex without intravenous contrast. COMPARISON:  02/17/2017 CT head FINDINGS: Brain: No evidence of acute infarction, hemorrhage, hydrocephalus, extra-axial collection or mass lesion/mass effect. Vascular: No hyperdense vessel or unexpected calcification. Skull: Normal. Negative for fracture or focal lesion. Sinuses/Orbits: No acute finding. Other: None. IMPRESSION: No acute intracranial abnormality.  Normal CT of the head. Electronically Signed   By: Kristine Garbe M.D.   On: 05/04/2017 22:02    Procedures  Procedures (including critical care time)  Medications Ordered in ED Medications - No data to display   Initial Impression / Assessment and Plan / ED Course  I have reviewed the triage vital signs and the nursing notes.  Pertinent labs & imaging results that were available during my care of the patient were reviewed by me and considered in my medical decision making (see chart for details).     Patient is a 36 year old male who presents with seizure-like activity.  He has a long-standing history of nonepileptic seizures.  Is felt to be related to his underlying psychiatric illness.  He was monitored here for an extended amount of time and still has fairly drowsy and not answering questions appropriately.  Given this, labs and CT scan was performed which shows no evidence of acute abnormality.  After further observation, he became fully alert and  oriented x3.  He is able to ambulate without ataxia.  He has no focal neurologic deficits.  His wife states he is back to baseline.  He was discharged home in good condition.  He recently started a new medication today which his wife feels may be making him more sedated.  I advised her to contact [place] tomorrow to see if they want to continue the medication.  Return precautions were given.  Final Clinical Impressions(s) / ED Diagnoses   Final diagnoses:  [place]    ED Discharge Orders    None       Malvin Johns, MD 05/04/17 2307

## 2017-05-04 NOTE — ED Triage Notes (Signed)
PT arrived to ED via [facility] with complaints of two seizure today at 1630, one after the other from home with family witnessing. Pt has hx of seizures and has recently been started on new medications. Per EMS on arrival Pt was postictal for 5 minutes after grand mal seizure. No fall to ground or other complaints.  VS 132/88 spo2 98% RA CBG 90 60 HR

## 2017-05-04 NOTE — ED Notes (Signed)
Pt tried to walk, stood up and felt like couldn't. Will try again in a little bit

## 2017-05-17 ENCOUNTER — Telehealth: Payer: Self-pay | Admitting: Neurology

## 2017-05-17 NOTE — Telephone Encounter (Signed)
Pt's spouse called and said his medication is not working and wants to know if the medication can be adjusted or asks if they need to find another neurologist, please call and advise

## 2017-05-17 NOTE — Telephone Encounter (Signed)
Spoke with pt's wife, Janace Hoard, who states that pt is experiencing one seizure a week on average.  I asked about medications - specifically Trileptal.  Pt's wife states that pt is taking 1/2 tablet in AM and not taking in PM.  I advised that pt needs to take his medications as prescribed.  Wife stated that she will call office in about a week for an update.

## 2017-05-19 ENCOUNTER — Emergency Department (HOSPITAL_COMMUNITY)
Admission: EM | Admit: 2017-05-19 | Discharge: 2017-05-19 | Disposition: A | Discharge status: Home / Self Care | Payer: Medicaid Other | Attending: Emergency Medicine | Admitting: Emergency Medicine

## 2017-05-19 DIAGNOSIS — F209 Schizophrenia, unspecified: Secondary | ICD-10-CM | POA: Diagnosis not present

## 2017-05-19 DIAGNOSIS — Z888 Allergy status to other drugs, medicaments and biological substances status: Secondary | ICD-10-CM | POA: Diagnosis not present

## 2017-05-19 DIAGNOSIS — R569 Unspecified convulsions: Secondary | ICD-10-CM | POA: Diagnosis not present

## 2017-05-19 DIAGNOSIS — Z79899 Other long term (current) drug therapy: Secondary | ICD-10-CM | POA: Diagnosis not present

## 2017-05-19 DIAGNOSIS — Z880 Allergy status to penicillin: Secondary | ICD-10-CM | POA: Insufficient documentation

## 2017-05-19 DIAGNOSIS — F319 Bipolar disorder, unspecified: Secondary | ICD-10-CM | POA: Insufficient documentation

## 2017-05-19 DIAGNOSIS — R42 Dizziness and giddiness: Secondary | ICD-10-CM | POA: Diagnosis present

## 2017-05-19 NOTE — ED Provider Notes (Addendum)
[place] DEPT Provider Note   CSN: [id number] Arrival date & time: 05/19/17  1010     History   Chief Complaint Chief Complaint  Patient presents with  . Seizures    HPI Franklin [name] is a 36 y.o. male.  Patient presents after possible seizure.  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  Pt's spouse indicates she left home today and he was his normal self.  Later, while he was waiting for his home health aide to arrive, he told her he felt lightheaded.  Wife returned and pt was 'acting as if he may have a seizure'.  No incontinence. No trauma/fall. No oral/tongue injury. Recent health at baseline. Spouse notes hx 'stressed induced seizures' but denies any recent increase in stress. Pt is very poor historian - level 5 caveat.    The history is provided by the patient, the spouse and the EMS personnel. The history is limited by the condition of the patient.  Seizures   Pertinent negatives include no confusion, no headaches, no visual disturbance, no sore throat and no chest pain.    Past Medical History:  Diagnosis Date  . Bipolar 1 disorder ([place])   . Chronic headaches   . Conversion disorder with seizures or convulsions   . 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 Depression   . Hx of electroencephalogram 12/2014   normal  . Mild cognitive impairment   . Psychogenic nonepileptic seizure   . Schizophrenic disorder ([place])   . Seizures [facility])     Patient Active Problem List   Diagnosis Date Noted  . Psychogenic nonepileptic seizure 02/18/2017  . Cholelithiasis 01/24/2017  . 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 Abnormal EKG 11/30/2015  . Elevated troponin 11/30/2015  . Bipolar affective disorder, most recent episode unspecified type, remission status unspecified 03/11/2015  . Bipolar affective disorder ([place]) 01/08/2015  .  Generalized idiopathic epilepsy and epileptic syndromes, without status epilepticus, not intractable ([place]) 11/27/2014  . Seizure disorder ([place]) 10/14/2014    No past surgical history on file.     Home Medications    Prior to Admission medications   Medication Sig Start Date End Date Taking? Authorizing Provider  FLUoxetine (PROZAC) 20 MG tablet Take 20 mg daily by mouth.   Yes [provider]  Oxcarbazepine (TRILEPTAL) 300 MG tablet Take 1/2 tablet in AM, 1 tablet in PM Patient taking differently: Take 150-300 mg by mouth 2 (two) times daily. Take 1/2 tablet in AM, 1 tablet in PM 04/18/17  Yes Cameron Sprang, MD  cetirizine (ZYRTEC) 10 MG tablet Take 1 tablet (10 mg total) by mouth daily. Patient not taking: Reported on 05/19/2017 02/18/17   Arnoldo Morale, MD    Family History Family History  Problem Relation Age of Onset  . Heart disease Mother     Social History Social History   Tobacco Use  . Smoking status: Never Smoker  . Smokeless tobacco: Never Used  Substance Use Topics  . Alcohol use: Yes    Alcohol/week: 0.0 oz    Comment: 40oz today  . Drug use: No     Allergies   Coconut oil; Other; Codeine; Depakote [divalproex sodium]; Penicillins; and Tape   Review of Systems Review of Systems  Constitutional: Negative for fever.  HENT: Negative for sore throat.   Eyes: Negative for visual disturbance.  Respiratory: Negative for shortness of breath.  Cardiovascular: Negative for chest pain.  Gastrointestinal: Negative for abdominal pain.  Genitourinary: Negative for flank pain.  Musculoskeletal: Negative for back pain and neck pain.  Skin: Negative for rash.  Neurological: Negative for headaches.  Hematological: Does not bruise/bleed easily.  Psychiatric/Behavioral: Negative for confusion.     Physical Exam Updated Vital Signs BP 130/86   Pulse (!) 50   Temp 98.4 F (36.9 C) (Oral)   Resp 12   SpO2 100%   Physical Exam  Constitutional: He  appears well-developed and well-nourished. No distress.  HENT:  Head: Atraumatic.  Mouth/Throat: Oropharynx is clear and moist.  Eyes: Conjunctivae are normal. Pupils are equal, round, and reactive to light.  Neck: Normal range of motion. Neck supple. No tracheal deviation present.  Cardiovascular: Normal rate, regular rhythm, normal heart sounds and intact distal pulses.  Pulmonary/Chest: Effort normal and breath sounds normal. No accessory muscle usage. No respiratory distress.  Abdominal: Soft. Bowel sounds are normal. He exhibits no distension. There is no tenderness.  Musculoskeletal: He exhibits no edema.  CTLS spine, non tender, aligned, no step off. No focal bony tenderness on bil extremity exam.   Neurological: He is alert.  Speech clear. Motor intact bil, stre 5/5. sens grossly intact.   Skin: Skin is warm and dry. No rash noted. He is not diaphoretic.  Psychiatric: He has a normal mood and affect.  Nursing note and vitals reviewed.    ED Treatments / Results  Labs (all labs ordered are listed, but only abnormal results are displayed) Labs Reviewed - No data to display  EKG  EKG Interpretation None       Radiology No results found.  Procedures Procedures (including critical care time)  Medications Ordered in ED Medications - No data to display   Initial Impression / Assessment and Plan / ED Course  I have reviewed the triage vital signs and the nursing notes.  Pertinent labs & imaging results that were available during my care of the patient were reviewed by me and considered in my medical decision making (see chart for details).  Iv ns. Monitor.   Reviewed nursing notes and prior charts for additional history.   No seizure activity in ED.  Po fluids/meal.  Pt observed in ED x 4 hours. No seizure activity. Tolerating po.  Talking on phone.   Patient appears at baseline, and stable for d/c.     Final Clinical Impressions(s) / ED Diagnoses    Final diagnoses:  None    ED Discharge Orders    None           Lajean Saver, MD 05/19/17 1418

## 2017-05-19 NOTE — ED Notes (Signed)
Upon preparing to prep patient for discharge, patient started yelling "Yall did not do SH*T to help me, why am I laying in this hospital bed?" Patient had removed himself from the monitor completely and refused to let me obtain a fresh set of vitals. Attempted to explain patient's plan of care, and patient refused to wait around for discharge paperwork. Patient discharged with wife at this time, patient ambulatory and no acute distress noted. Security called to bedside and escorted patient out of ED. IV removed and patient seen walking out of ED without assistance by multiple staff members.

## 2017-05-19 NOTE — ED Triage Notes (Signed)
Transported by [facility] from home. Pt was found in the floor by his caregiver, unwitnessed seizure. Pt reports neck pain and states that he has felt dizzy upon waking up this morning.

## 2017-05-19 NOTE — ED Notes (Signed)
Patient now speaking with family members at bedside. Alert and oriented x 4, no tremors/ extremity movement or seizure like activity noted.

## 2017-05-19 NOTE — ED Notes (Signed)
Bed: CX44 Expected date:  Expected time:  Means of arrival:  Comments: EMS-SZ

## 2017-05-19 NOTE — Discharge Instructions (Signed)
It was our pleasure to provide your ER care today - we hope that you feel better.  Follow up with your doctor/neurologist in the coming week.  No driving, swimming, or operating heavy machinery until cleared to do so by your doctor.   Return to ER if worse, new symptoms, fevers, new or severe pain, persistent seizures, other concern.

## 2017-05-20 ENCOUNTER — Emergency Department (HOSPITAL_COMMUNITY): Payer: Medicaid Other

## 2017-05-20 ENCOUNTER — Emergency Department (HOSPITAL_COMMUNITY)
Admission: EM | Admit: 2017-05-20 | Discharge: 2017-05-20 | Disposition: A | Discharge status: Home / Self Care | Payer: Medicaid Other | Attending: Emergency Medicine | Admitting: Emergency Medicine

## 2017-05-20 DIAGNOSIS — R001 Bradycardia, unspecified: Secondary | ICD-10-CM | POA: Insufficient documentation

## 2017-05-20 DIAGNOSIS — Z79899 Other long term (current) drug therapy: Secondary | ICD-10-CM | POA: Diagnosis not present

## 2017-05-20 DIAGNOSIS — F209 Schizophrenia, unspecified: Secondary | ICD-10-CM | POA: Diagnosis not present

## 2017-05-20 DIAGNOSIS — R42 Dizziness and giddiness: Secondary | ICD-10-CM | POA: Insufficient documentation

## 2017-05-20 DIAGNOSIS — R569 Unspecified convulsions: Secondary | ICD-10-CM | POA: Diagnosis present

## 2017-05-20 DIAGNOSIS — F319 Bipolar disorder, unspecified: Secondary | ICD-10-CM | POA: Diagnosis not present

## 2017-05-20 LAB — CBC WITH DIFFERENTIAL/PLATELET
BASOS ABS: 0 10*3/uL (ref 0.0–0.1)
Basophils Relative: 0 %
EOS PCT: 0 %
Eosinophils Absolute: 0 10*3/uL (ref 0.0–0.7)
HCT: 44.9 % (ref 39.0–52.0)
Hemoglobin: 15.5 g/dL (ref 13.0–17.0)
LYMPHS PCT: 23 %
Lymphs Abs: 1.4 10*3/uL (ref 0.7–4.0)
MCH: 30.2 pg (ref 26.0–34.0)
MCHC: 34.5 g/dL (ref 30.0–36.0)
MCV: 87.4 fL (ref 78.0–100.0)
Monocytes Absolute: 0.4 10*3/uL (ref 0.1–1.0)
Monocytes Relative: 7 %
Neutro Abs: 4.2 10*3/uL (ref 1.7–7.7)
Neutrophils Relative %: 70 %
PLATELETS: 182 10*3/uL (ref 150–400)
RBC: 5.14 MIL/uL (ref 4.22–5.81)
RDW: 12.5 % (ref 11.5–15.5)
WBC: 5.9 10*3/uL (ref 4.0–10.5)

## 2017-05-20 LAB — BASIC METABOLIC PANEL
ANION GAP: 5 (ref 5–15)
BUN: 7 mg/dL (ref 6–20)
CO2: 29 mmol/L (ref 22–32)
Calcium: 10.1 mg/dL (ref 8.9–10.3)
Chloride: 102 mmol/L (ref 101–111)
Creatinine, Ser: 0.95 mg/dL (ref 0.61–1.24)
Glucose, Bld: 84 mg/dL (ref 65–99)
POTASSIUM: 4.3 mmol/L (ref 3.5–5.1)
SODIUM: 136 mmol/L (ref 135–145)

## 2017-05-20 MED ORDER — MIDAZOLAM HCL 2 MG/2ML IJ SOLN
2.0000 mg | Freq: Once | INTRAMUSCULAR | Status: AC
  Administered 2017-05-20: 2 mg via INTRAVENOUS
  Filled 2017-05-20: qty 2

## 2017-05-20 MED ORDER — MIDAZOLAM HCL 2 MG/2ML IJ SOLN
5.0000 mg | Freq: Once | INTRAMUSCULAR | Status: DC
  Filled 2017-05-20: qty 6

## 2017-05-20 NOTE — ED Notes (Signed)
Patient transported to CT 

## 2017-05-20 NOTE — Discharge Instructions (Signed)
Make sure that you are drinking plenty of water.  Reduce your morning dose of oxcarbazepine to 1/2 tablet in the morning and 1/2 tablet at night.  Follow closely with her primary care physician.   Contact a health care provider if: You feel light-headed or dizzy. You almost faint. You feel weak or are easily fatigued during physical activity. You experience confusion or have memory problems. Get help right away if: You faint. You have an irregular heartbeat (palpitations). You have chest pain. You have trouble breathing.

## 2017-05-20 NOTE — ED Provider Notes (Signed)
[facility] EMERGENCY DEPARTMENT Provider Note   CSN: [id number] Arrival date & time: 05/20/17  1042     History   Chief Complaint Chief Complaint  Patient presents with  . Seizures    HPI [name] is a 36 y.o. male who presents for seizure. He was seen yesterday at [facility]. The patient was seen yesterday at [facility] for the same.  He is attended by his wife.  She states that he is on oxcarbazepine and is followed by Dr.Aquino at [facility] Neuro. he also takes Prozac.  He has a history of schizophrenia, bipolar.  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 His Wife said that he had 2 more seizures this morning.  She states that they were [place] and the next thing she knew "he was on the floor shaking."The patient is noted to be a very poor historian. He does c/o pain in the back of his neck. HPI  Past Medical History:  Diagnosis Date  . Bipolar 1 disorder ([place])   . Chronic headaches   . Conversion disorder with seizures or convulsions   . 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 Depression   . Hx of electroencephalogram 12/2014   normal  . Mild cognitive impairment   . Psychogenic nonepileptic seizure   . Schizophrenic disorder ([place])   . Seizures [facility])     Patient Active Problem List   Diagnosis Date Noted  . Psychogenic nonepileptic seizure 02/18/2017  . Cholelithiasis 01/24/2017  . 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 Abnormal EKG 11/30/2015  . Elevated troponin 11/30/2015  . Bipolar affective disorder, most recent episode unspecified type, remission status unspecified 03/11/2015  . Bipolar affective disorder ([place]) 01/08/2015  . Generalized idiopathic epilepsy and epileptic syndromes, without status epilepticus, not intractable ([place]) 11/27/2014  . Seizure disorder ([place]) 10/14/2014     No past surgical history on file.     Home Medications    Prior to Admission medications   Medication Sig Start Date End Date Taking? Authorizing Provider  acetaminophen (TYLENOL) 500 MG tablet Take 1,000 mg by mouth every 6 (six) hours as needed for headache.   Yes [provider]  FLUoxetine (PROZAC) 20 MG tablet Take 20 mg daily by mouth.   Yes [provider]  Oxcarbazepine (TRILEPTAL) 300 MG tablet Take 1/2 tablet in AM, 1 tablet in PM Patient taking differently: Take 150-300 mg by mouth 2 (two) times daily. Take 1/2 tablet in AM, 1 tablet in PM 04/18/17  Yes Cameron Sprang, MD  cetirizine (ZYRTEC) 10 MG tablet Take 1 tablet (10 mg total) by mouth daily. Patient not taking: Reported on 05/20/2017 02/18/17   Arnoldo Morale, MD    Family History Family History  Problem Relation Age of Onset  . Heart disease Mother     Social History Social History   Tobacco Use  . Smoking status: Never Smoker  . Smokeless tobacco: Never Used  Substance Use Topics  . Alcohol use: Yes    Alcohol/week: 0.0 oz    Comment: 40oz today  . Drug use: No     Allergies   Coconut oil; Other; Codeine; Depakote [divalproex sodium]; Penicillins; and Tape   Review of Systems Review of Systems Ten systems reviewed and are negative for acute change, except as noted in the HPI.    Physical  Exam Updated Vital Signs BP 111/84   Pulse (!) 53   Temp 98.4 F (36.9 C) (Oral)   Resp 16   Ht 6' (1.829 m)   Wt 68 kg (150 lb)   SpO2 99%   BMI 20.34 kg/m   Physical Exam  Constitutional: He appears well-developed and well-nourished. No distress.  HENT:  Head: Normocephalic and atraumatic.  No mouth trauma  Eyes: Conjunctivae are normal. No scleral icterus.  Neck:  On c-spine precautions  Cardiovascular: Normal rate, regular rhythm and normal heart sounds.  Pulmonary/Chest: Effort normal and breath sounds normal. No respiratory distress.  Abdominal: Soft. There is no  tenderness.  Musculoskeletal: He exhibits no edema.  Neurological: He is alert.  Skin: Skin is warm and dry. He is not diaphoretic.  Psychiatric: His behavior is normal.  Nursing note and vitals reviewed.    ED Treatments / Results  Labs (all labs ordered are listed, but only abnormal results are displayed) Labs Reviewed  BASIC METABOLIC PANEL  CBC WITH DIFFERENTIAL/PLATELET  CBG MONITORING, ED    EKG  EKG Interpretation  Date/Time:  Friday May 20 2017 11:41:19 EST Ventricular Rate:  45 PR Interval:    QRS Duration: 116 QT Interval:  439 QTC Calculation: 380 R Axis:   -29 Text Interpretation:  Sinus bradycardia Nonspecific intraventricular conduction delay ST elev, probable normal early repol pattern No significant change since last tracing Confirmed by Isla Pence [phone]) on 05/20/2017 12:02:45 PM       Radiology Ct Head Wo Contrast  Result Date: 05/20/2017 CLINICAL DATA:  Posttraumatic headache and neck pain after seizure and fall. EXAM: CT HEAD WITHOUT CONTRAST CT CERVICAL SPINE WITHOUT CONTRAST TECHNIQUE: Multidetector CT imaging of the head and cervical spine was performed following the standard protocol without intravenous contrast. Multiplanar CT image reconstructions of the cervical spine were also generated. COMPARISON:  CT scan of May 04, 2017. FINDINGS: CT HEAD FINDINGS Brain: No evidence of acute infarction, hemorrhage, hydrocephalus, extra-axial collection or mass lesion/mass effect. Vascular: No hyperdense vessel or unexpected calcification. Skull: Normal. Negative for fracture or focal lesion. Sinuses/Orbits: No acute finding. Other: None. CT CERVICAL SPINE FINDINGS Alignment: Normal. Skull base and vertebrae: No acute fracture. No primary bone lesion or focal pathologic process. Soft tissues and spinal canal: No prevertebral fluid or swelling. No visible canal hematoma. Disc levels:  Normal. Upper chest: Negative. Other: None. IMPRESSION: Normal head  CT. Normal cervical spine. Electronically Signed   By: Marijo Conception, M.D.   On: 05/20/2017 11:50   Ct Cervical Spine Wo Contrast  Result Date: 05/20/2017 CLINICAL DATA:  Posttraumatic headache and neck pain after seizure and fall. EXAM: CT HEAD WITHOUT CONTRAST CT CERVICAL SPINE WITHOUT CONTRAST TECHNIQUE: Multidetector CT imaging of the head and cervical spine was performed following the standard protocol without intravenous contrast. Multiplanar CT image reconstructions of the cervical spine were also generated. COMPARISON:  CT scan of May 04, 2017. FINDINGS: CT HEAD FINDINGS Brain: No evidence of acute infarction, hemorrhage, hydrocephalus, extra-axial collection or mass lesion/mass effect. Vascular: No hyperdense vessel or unexpected calcification. Skull: Normal. Negative for fracture or focal lesion. Sinuses/Orbits: No acute finding. Other: None. CT CERVICAL SPINE FINDINGS Alignment: Normal. Skull base and vertebrae: No acute fracture. No primary bone lesion or focal pathologic process. Soft tissues and spinal canal: No prevertebral fluid or swelling. No visible canal hematoma. Disc levels:  Normal. Upper chest: Negative. Other: None. IMPRESSION: Normal head CT. Normal cervical spine. Electronically Signed   By: Jeneen Rinks  Murlean Caller, M.D.   On: 05/20/2017 11:50    Procedures Procedures (including critical care time)  Medications Ordered in ED Medications  midazolam (VERSED) injection 2 mg (2 mg Intravenous Given 05/20/17 1231)     Initial Impression / Assessment and Plan / ED Course  I have reviewed the triage vital signs and the nursing notes.  Pertinent labs & imaging results that were available during my care of the patient were reviewed by me and considered in my medical decision making (see chart for details).  Clinical Course as of May 20 1625  Fri May 20, 2017  1410 Patient persistently bradycardic. Question if the patient's seizure this morning was actually a syncopal event.     [AH]  1614 I spoke with Dr. Sallyanne Kuster about the patient's bradycardia.  He doubts that there is sinus node issue.  I did review his EKG with the cardiologist as well.  I then spoke with Dr. Delice Lesch by phone we discussed his medication management and given the fact that less than 1% of patients can potentially have bradycardia from oxcarbazepine we will lower his dose to 1/2 pill in the morning and 1/2 pill at night.  He is then to follow-up with his primary care physician closely for workup for bradycardia.  Discussed these findings with the patient who will follow up with PCP.  I also have the chaplain come to discuss healthcare power attorney as the request of the patient.  [AH]    Clinical Course User Index [AH] Margarita Mail, PA-C      Final Clinical Impressions(s) / ED Diagnoses   Final diagnoses:  Bradycardia  Lightheadedness    ED Discharge Orders    None       Margarita Mail, PA-C 05/20/17 1626    Isla Pence, MD 05/21/17 [phone]

## 2017-05-20 NOTE — ED Triage Notes (Signed)
Pt BIB EMS from home for seizure. Per EMS pt was seen at [facility] yesterday for the same. Today was found on floor having grand mal seizure by home health nurse. Pt did not take keppra yesterday, and had seizure before he could take keppra today. Pt reports head, neck, and back pain; pt already sent to CT. Resp e/u; NAD at this time.

## 2017-05-20 NOTE — Progress Notes (Signed)
Patient and fiancee requested AD forms to fill out. They may want to fill out today or maybe Monday if not able. Conard Novak, Chaplain    05/20/17 1600  Clinical Encounter Type  Visited With Patient;Other (Comment) (fiancee)  Visit Type Initial;Other (Comment)  Referral From Nurse (Wanted AD forms)  Consult/Referral To Chaplain

## 2017-05-20 NOTE — ED Notes (Addendum)
Waiting on chaplain to come assist pt with paperwork for living will. Chaplain informed this RN that they can not complete paperwork on the weekends. EDP made aware so that pt can be discharged.

## 2017-05-21 ENCOUNTER — Emergency Department (HOSPITAL_COMMUNITY)
Admission: EM | Admit: 2017-05-21 | Discharge: 2017-05-21 | Disposition: A | Discharge status: Home / Self Care | Payer: Medicaid Other | Attending: Emergency Medicine | Admitting: Emergency Medicine

## 2017-05-21 DIAGNOSIS — Z79899 Other long term (current) drug therapy: Secondary | ICD-10-CM | POA: Insufficient documentation

## 2017-05-21 DIAGNOSIS — K625 Hemorrhage of anus and rectum: Secondary | ICD-10-CM | POA: Diagnosis not present

## 2017-05-21 LAB — CBC WITH DIFFERENTIAL/PLATELET
BASOS ABS: 0 10*3/uL (ref 0.0–0.1)
Basophils Relative: 0 %
Eosinophils Absolute: 0 10*3/uL (ref 0.0–0.7)
Eosinophils Relative: 1 %
HEMATOCRIT: 43.4 % (ref 39.0–52.0)
Hemoglobin: 14.7 g/dL (ref 13.0–17.0)
LYMPHS ABS: 1.9 10*3/uL (ref 0.7–4.0)
LYMPHS PCT: 30 %
MCH: 29.9 pg (ref 26.0–34.0)
MCHC: 33.9 g/dL (ref 30.0–36.0)
MCV: 88.4 fL (ref 78.0–100.0)
Monocytes Absolute: 0.4 10*3/uL (ref 0.1–1.0)
Monocytes Relative: 6 %
NEUTROS ABS: 4.1 10*3/uL (ref 1.7–7.7)
Neutrophils Relative %: 63 %
Platelets: 184 10*3/uL (ref 150–400)
RBC: 4.91 MIL/uL (ref 4.22–5.81)
RDW: 12.7 % (ref 11.5–15.5)
WBC: 6.5 10*3/uL (ref 4.0–10.5)

## 2017-05-21 LAB — URINALYSIS, ROUTINE W REFLEX MICROSCOPIC
Bilirubin Urine: NEGATIVE
GLUCOSE, UA: NEGATIVE mg/dL
Hgb urine dipstick: NEGATIVE
Ketones, ur: NEGATIVE mg/dL
LEUKOCYTES UA: NEGATIVE
Nitrite: NEGATIVE
PROTEIN: NEGATIVE mg/dL
SPECIFIC GRAVITY, URINE: 1.01 (ref 1.005–1.030)
pH: 8 (ref 5.0–8.0)

## 2017-05-21 LAB — PROTIME-INR
INR: 1.05
Prothrombin Time: 13.6 seconds (ref 11.4–15.2)

## 2017-05-21 LAB — POC OCCULT BLOOD, ED: FECAL OCCULT BLD: NEGATIVE

## 2017-05-21 NOTE — ED Notes (Addendum)
Pt has refused bloodwork x2; refusing rectal exam (?--according to wife); is getting dressed at 15:31.

## 2017-05-21 NOTE — ED Triage Notes (Signed)
Pt BIB EMS for bright red blood in stools. Pt reports twice sitting down to have a BM, did not have a BM but reports blood in toilet. Pt a&ox4; resp e/u. NAD at this time. SO at bedside.

## 2017-05-21 NOTE — ED Notes (Signed)
Pt's wife stated that he used the restroom at 15:00 and reported normal stool (no blood present).

## 2017-05-21 NOTE — ED Provider Notes (Signed)
[name] Provider Note   CSN: [id number] Arrival date & time: 05/21/17  1425     History   Chief Complaint Chief Complaint  Patient presents with  . Rectal Bleeding    HPI [name] is a 36 y.o. 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  He reports that he felt the urge to have a bowel movement approximately 1 PM this afternoon, and when he went to the bathroom he had one large painful episode of gross blood without stool.  He states that he had a bowel movement after arriving in the [name] department, which was soft, formed, and brown in color.   He reports one history of similar symptoms approximately 1 year ago that resolved spontaneously.  He reports that he never followed up with gastroenterology after the episode.  He denies abdominal pain, nausea, vomiting, diarrhea, constipation, fever, chills, dysuria, hematuria, or hematemesis. No pain or symptoms at this time.  He denies syncope, lightheadedness, or dizziness.  No previous colonoscopy.  No family history of colorectal cancer.   The history is provided by the patient. No language interpreter was used.  Rectal Bleeding  Quality:  Bright red Amount:  Moderate Chronicity:  New Context: spontaneously   Context: not anal fissures, not constipation, not diarrhea, not foreign body, not hemorrhoids and not rectal injury   Similar prior episodes: yes   Relieved by:  Nothing Worsened by:  Nothing Ineffective treatments:  None tried Associated symptoms: no abdominal pain, no dizziness, no fever, no light-headedness and no vomiting   Risk factors: no anticoagulant use, no hx of colorectal cancer and no hx of IBD     Past Medical History:  Diagnosis Date  . Bipolar 1 disorder ([place])   . Chronic headaches   . Conversion disorder with seizures or convulsions   . Convulsion, non-epileptic  [facility])    "raises the possibility of non-epileptic events" per Neuro MD office note 03/2015  . Depression   . Hx of electroencephalogram 12/2014   normal  . Mild cognitive impairment   . Psychogenic nonepileptic seizure   . Schizophrenic disorder ([place])   . Seizures [facility])     Patient Active Problem List   Diagnosis Date Noted  . Psychogenic nonepileptic seizure 02/18/2017  . Cholelithiasis 01/24/2017  . 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 Abnormal EKG 11/30/2015  . Elevated troponin 11/30/2015  . Bipolar affective disorder, most recent episode unspecified type, remission status unspecified 03/11/2015  . Bipolar affective disorder ([place]) 01/08/2015  . Generalized idiopathic epilepsy and epileptic syndromes, without status epilepticus, not intractable ([place]) 11/27/2014  . Seizure disorder ([place]) 10/14/2014    No past surgical history on file.     Home Medications    Prior to Admission medications   Medication Sig Start Date End Date Taking? Authorizing Provider  acetaminophen (TYLENOL) 500 MG tablet Take 1,000 mg by mouth every 6 (six) hours as needed for headache.    [provider]  cetirizine (ZYRTEC) 10 MG tablet Take 1 tablet (10 mg total) by mouth daily. Patient not taking: Reported on 05/20/2017 02/18/17   Arnoldo Morale, MD  FLUoxetine (PROZAC) 20 MG tablet Take 20 mg daily by mouth.    [provider]  Oxcarbazepine (TRILEPTAL) 300 MG tablet Take 1/2 tablet in AM, 1 tablet in PM Patient taking differently: Take 150-300 mg by mouth 2 (two) times daily.  Take 1/2 tablet in AM, 1 tablet in PM 04/18/17   [facility] Sprang, MD    Family History Family History  Problem Relation Age of Onset  . Heart disease Mother     Social History Social History   Tobacco Use  . Smoking status: Never Smoker  . Smokeless tobacco: Never Used  Substance Use Topics  . Alcohol use: Yes    Alcohol/week: 0.0 oz    Comment: 40oz today  . Drug use:  No     Allergies   Coconut oil; Other; Codeine; Depakote [divalproex sodium]; Penicillins; and Tape   Review of Systems Review of Systems  Constitutional: Negative for appetite change, chills and fever.  Eyes: Negative for visual disturbance.  Respiratory: Negative for shortness of breath.   Cardiovascular: Negative for chest pain.  Gastrointestinal: Positive for hematochezia and rectal pain. Negative for abdominal pain, diarrhea, nausea and vomiting.  Genitourinary: Negative for dysuria.  Musculoskeletal: Negative for back pain.  Skin: Negative for rash.  Allergic/Immunologic: Negative for immunocompromised state.  Neurological: Negative for dizziness, syncope, weakness, light-headedness, numbness and headaches.  Psychiatric/Behavioral: Negative for confusion.   Physical Exam Updated Vital Signs BP 111/83   Pulse (!) 51   Temp 98.3 F (36.8 C) (Oral)   Resp 16   Ht 5\' 9"  (1.753 m)   Wt 70.3 kg (155 lb)   SpO2 99%   BMI 22.89 kg/m   Physical Exam  Constitutional: He appears well-developed. No distress.  Sitting comfortably playing on his phone.   HENT:  Head: Normocephalic.  Eyes: Conjunctivae are normal.  Neck: Normal range of motion. Neck supple.  Cardiovascular: Normal rate, regular rhythm, normal heart sounds and intact distal pulses. Exam reveals no gallop and no friction rub.  No murmur heard. Pulmonary/Chest: Effort normal and breath sounds normal. No stridor. No respiratory distress. He has no wheezes. He has no rales. He exhibits no tenderness.  Abdominal: Soft. Bowel sounds are normal. He exhibits no distension and no mass. There is no tenderness. There is no rebound and no guarding. No hernia.  Genitourinary:  Genitourinary Comments: Chaperoned exam.  No external hemorrhoids palpated.  Prostate is normal.  No gross blood on digital rectal exam. Light brown stool noted.  No sinus tracts, skin tears, or anal fissures.  Musculoskeletal: He exhibits no  tenderness.  Neurological: He is alert.  Skin: Skin is warm and dry. Capillary refill takes less than 2 seconds. He is not diaphoretic. No erythema. No pallor.  Psychiatric: His behavior is normal.  Nursing note and vitals reviewed.    ED Treatments / Results  Labs (all labs ordered are listed, but only abnormal results are displayed) Labs Reviewed  URINALYSIS, ROUTINE W REFLEX MICROSCOPIC - Abnormal; Notable for the following components:      Result Value   Color, Urine STRAW (*)    All other components within normal limits  CBC WITH DIFFERENTIAL/PLATELET  PROTIME-INR  POC OCCULT BLOOD, ED    EKG  EKG Interpretation None       Radiology Ct Head Wo Contrast  Result Date: 05/20/2017 CLINICAL DATA:  Posttraumatic headache and neck pain after seizure and fall. EXAM: CT HEAD WITHOUT CONTRAST CT CERVICAL SPINE WITHOUT CONTRAST TECHNIQUE: Multidetector CT imaging of the head and cervical spine was performed following the standard protocol without intravenous contrast. Multiplanar CT image reconstructions of the cervical spine were also generated. COMPARISON:  CT scan of May 04, 2017. FINDINGS: CT HEAD FINDINGS Brain: No evidence of acute infarction, hemorrhage,  hydrocephalus, extra-axial collection or mass lesion/mass effect. Vascular: No hyperdense vessel or unexpected calcification. Skull: Normal. Negative for fracture or focal lesion. Sinuses/Orbits: No acute finding. Other: None. CT CERVICAL SPINE FINDINGS Alignment: Normal. Skull base and vertebrae: No acute fracture. No primary bone lesion or focal pathologic process. Soft tissues and spinal canal: No prevertebral fluid or swelling. No visible canal hematoma. Disc levels:  Normal. Upper chest: Negative. Other: None. IMPRESSION: Normal head CT. Normal cervical spine. Electronically Signed   By: Marijo Conception, M.D.   On: 05/20/2017 11:50   Ct Cervical Spine Wo Contrast  Result Date: 05/20/2017 CLINICAL DATA:  Posttraumatic  headache and neck pain after seizure and fall. EXAM: CT HEAD WITHOUT CONTRAST CT CERVICAL SPINE WITHOUT CONTRAST TECHNIQUE: Multidetector CT imaging of the head and cervical spine was performed following the standard protocol without intravenous contrast. Multiplanar CT image reconstructions of the cervical spine were also generated. COMPARISON:  CT scan of May 04, 2017. FINDINGS: CT HEAD FINDINGS Brain: No evidence of acute infarction, hemorrhage, hydrocephalus, extra-axial collection or mass lesion/mass effect. Vascular: No hyperdense vessel or unexpected calcification. Skull: Normal. Negative for fracture or focal lesion. Sinuses/Orbits: No acute finding. Other: None. CT CERVICAL SPINE FINDINGS Alignment: Normal. Skull base and vertebrae: No acute fracture. No primary bone lesion or focal pathologic process. Soft tissues and spinal canal: No prevertebral fluid or swelling. No visible canal hematoma. Disc levels:  Normal. Upper chest: Negative. Other: None. IMPRESSION: Normal head CT. Normal cervical spine. Electronically Signed   By: Marijo Conception, M.D.   On: 05/20/2017 11:50    Procedures Procedures (including critical care time)  Medications Ordered in ED Medications - No data to display   Initial Impression / Assessment and Plan / ED Course  I have reviewed the triage vital signs and the nursing notes.  Pertinent labs & imaging results that were available during my care of the patient were reviewed by me and considered in my medical decision making (see chart for details).     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  He is here today for an episode of gross bright red blood per rectum this afternoon.  He had one similar episode after reviewing his medical records in December 2017 that resolved spontaneously. He has had one normal bowel movements without hematochezia,  gross blood, or melena since the onset of his symptoms.  He has no other complaints at this time.  On physical exam, he has a normal exam of the rectum and anus. Hemoccult negative. H/H stable.  Urinalysis is unremarkable.  He is hemodynamically stable, borderline bradycardia, but a review of his medical record suggest this is the patient's baseline.  Will provide a referral to gastroenterology if symptoms persist.  Strict return precautions given.  No acute distress.  The patient is safe for discharge at this time.  Final Clinical Impressions(s) / ED Diagnoses   Final diagnoses:  BRBPR (bright red blood per rectum)    ED Discharge Orders    None       Joanne Gavel, PA-C 05/21/17 1723    Tegeler, Gwenyth Allegra, MD 05/21/17 2033

## 2017-05-21 NOTE — Discharge Instructions (Signed)
Your rectal exam was negative for any blood in your stool today.  Your labs were reassuring.  If you notice a small amount of blood in your stools or have additional concerns, you can follow-up with [facility] gastroenterology.  Their contact information is listed above.  If you develop worsening symptoms, including repeated episodes of bright red blood in your stool, black stools, fever, or severe abdominal or pelvic pain, please return to the emergency department for reevaluation.

## 2017-06-10 ENCOUNTER — Ambulatory Visit (HOSPITAL_COMMUNITY): Admission: RE | Admit: 2017-06-10 | Payer: Medicaid Other | Source: Ambulatory Visit

## 2017-06-10 ENCOUNTER — Emergency Department (HOSPITAL_COMMUNITY): Payer: Medicaid Other

## 2017-06-10 ENCOUNTER — Emergency Department (HOSPITAL_COMMUNITY)
Admission: EM | Admit: 2017-06-10 | Discharge: 2017-06-10 | Discharge status: Against Medical Advice | Payer: Medicaid Other | Attending: Emergency Medicine | Admitting: Emergency Medicine

## 2017-06-10 ENCOUNTER — Encounter (HOSPITAL_COMMUNITY): Payer: Self-pay | Admitting: Emergency Medicine

## 2017-06-10 DIAGNOSIS — Z79899 Other long term (current) drug therapy: Secondary | ICD-10-CM | POA: Diagnosis not present

## 2017-06-10 DIAGNOSIS — W19XXXA Unspecified fall, initial encounter: Secondary | ICD-10-CM

## 2017-06-10 DIAGNOSIS — M25561 Pain in right knee: Secondary | ICD-10-CM

## 2017-06-10 DIAGNOSIS — M25532 Pain in left wrist: Secondary | ICD-10-CM | POA: Insufficient documentation

## 2017-06-10 NOTE — ED Triage Notes (Signed)
Per [facility] patient from friend's home for fall today causing pain to left hand. Patient was ambulatory on scene. Vitals:

## 2017-06-10 NOTE — ED Provider Notes (Signed)
Franklin [name]   CSN: [id number] Arrival date & time: 06/10/17  1433     History   Chief Complaint Chief Complaint  Patient presents with  . Fall    HPI Franklin [name] is a 37 y.o. male.  HPI   37 year old male presents status post fall.  Patient notes that the was stepping off the bus and slipped in water causing him to land on his left wrist and right knee.  He notes a superficial abrasion over the wrist and pain to the anterior knee.  He notes he was unable to ambulate secondary to pain.  No medications prior to arrival.  Past Medical History:  Diagnosis Date  . Bipolar 1 disorder ([place])   . Chronic headaches   . Conversion disorder with seizures or convulsions   . 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 Depression   . Hx of electroencephalogram 12/2014   normal  . Mild cognitive impairment   . Psychogenic nonepileptic seizure   . Schizophrenic disorder ([place])   . Seizures [facility])     Patient Active Problem List   Diagnosis Date Noted  . Psychogenic nonepileptic seizure 02/18/2017  . Cholelithiasis 01/24/2017  . 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 Abnormal EKG 11/30/2015  . Elevated troponin 11/30/2015  . Bipolar affective disorder, most recent episode unspecified type, remission status unspecified 03/11/2015  . Bipolar affective disorder ([place]) 01/08/2015  . 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 Seizure disorder ([place]) 10/14/2014    History reviewed. No pertinent surgical history.     Home Medications    Prior to Admission medications   Medication Sig Start Date End Date Taking? Authorizing Provider  acetaminophen (TYLENOL) 500 MG tablet Take 1,000 mg by mouth every 6 (six) hours as needed for headache.    [provider]  cetirizine (ZYRTEC) 10 MG tablet Take 1 tablet (10 mg total) by mouth daily. Patient not taking: Reported on 05/20/2017 02/18/17   Arnoldo Morale, MD  FLUoxetine (PROZAC) 20 MG tablet Take 20 mg daily by mouth.    [provider]  Oxcarbazepine (TRILEPTAL) 300 MG tablet Take 1/2 tablet in AM, 1 tablet in PM Patient taking differently: Take 150-300 mg by mouth 2 (two) times daily. Take 1/2 tablet in AM, 1 tablet in PM 04/18/17   Cameron Sprang, MD    Family History Family History  Problem Relation Age of Onset  . Heart disease Mother     Social History Social History   Tobacco Use  . Smoking status: Never Smoker  . Smokeless tobacco: Never Used  Substance Use Topics  . Alcohol use: Yes    Alcohol/week: 0.0 oz    Comment: 40oz today  . Drug use: No     Allergies   Coconut oil; Other; Codeine; Depakote [divalproex sodium]; Penicillins; and Tape   Review of Systems Review of Systems  All other systems reviewed and are negative.    Physical Exam Updated Vital Signs BP 127/85 (BP Location: Right Arm)   Pulse 61   Temp 98.4 F (36.9 C) (Oral)   Resp 14   SpO2 100%   Physical Exam  Constitutional: He is oriented to person, place, and time. He appears well-developed and well-nourished.  HENT:  Head: Normocephalic and atraumatic.  Eyes: Conjunctivae are normal. Pupils are equal, round, and reactive  to light. Right eye exhibits no discharge. Left eye exhibits no discharge. No scleral icterus.  Neck: Normal range of motion. No JVD present. No tracheal deviation present.  Pulmonary/Chest: Effort normal. No stridor.  Musculoskeletal:  Superficial abrasion over the left dorsal wrist no swelling or edema, reduced range of motion due to discomfort, radial pulse 2+ sensation intact  Right knee atraumatic with no swelling or edema, tenderness palpation of the anterior aspect  Neurological: He is alert and oriented to person, place, and time. Coordination normal.    Psychiatric: He has a normal mood and affect. His behavior is normal. Judgment and thought content normal.  Nursing [name] and vitals reviewed.    ED Treatments / Results  Labs (all labs ordered are listed, but only abnormal results are displayed) Labs Reviewed - No data to display  EKG  EKG Interpretation None       Radiology Dg Hand Complete Left  Result Date: 06/10/2017 CLINICAL DATA:  Fall, injury EXAM: LEFT HAND - COMPLETE 3+ VIEW COMPARISON:  07/30/2015 FINDINGS: There is no evidence of fracture or dislocation. There is no evidence of arthropathy or other focal bone abnormality. Soft tissues are unremarkable. IMPRESSION: Negative. Electronically Signed   By: Franchot Gallo M.D.   On: 06/10/2017 16:22    Procedures Procedures (including critical care time)  Medications Ordered in ED Medications - No data to display   Initial Impression / Assessment and Plan / ED Course  I have reviewed the triage vital signs and the nursing notes.  Pertinent labs & imaging results that were available during my care of the patient were reviewed by me and considered in my medical decision making (see chart for details).    37 year old male presents today status post fall.  Upon initial evaluation patient had plain films of his hand.  No obvious swelling or he did have a superficial abrasion over the wrist.  Patient also complaining of knee pain, I placed an order for plain films of his knee and was intending to reevaluate him after imaging study.  Patient eloped without telling anyone. Final Clinical Impressions(s) / ED Diagnoses   Final diagnoses:  Fall, initial encounter  Acute pain of right knee  Left wrist pain    ED Discharge Orders    None       Francee Gentile 06/10/17 1839    Lacretia Leigh, MD 06/11/17 [phone]

## 2017-06-21 ENCOUNTER — Telehealth: Payer: Self-pay | Admitting: Neurology

## 2017-06-21 NOTE — Telephone Encounter (Signed)
Pt called and would like a call back regarding his medication, did not remember the name

## 2017-06-22 ENCOUNTER — Ambulatory Visit: Payer: Self-pay | Admitting: Family Medicine

## 2017-06-22 NOTE — Telephone Encounter (Signed)
Returned call.  No answer.  LMOM asking for return call.  

## 2017-06-28 ENCOUNTER — Ambulatory Visit: Payer: Medicaid Other | Attending: Family Medicine | Admitting: Family Medicine

## 2017-06-28 ENCOUNTER — Encounter: Payer: Self-pay | Admitting: Family Medicine

## 2017-06-28 VITALS — BP 131/76 | HR 67 | Temp 98.4°F | Ht 72.0 in | Wt 166.6 lb

## 2017-06-28 DIAGNOSIS — R569 Unspecified convulsions: Secondary | ICD-10-CM | POA: Diagnosis not present

## 2017-06-28 DIAGNOSIS — R05 Cough: Secondary | ICD-10-CM | POA: Diagnosis not present

## 2017-06-28 DIAGNOSIS — J111 Influenza due to unidentified influenza virus with other respiratory manifestations: Secondary | ICD-10-CM | POA: Diagnosis not present

## 2017-06-28 DIAGNOSIS — G3184 Mild cognitive impairment, so stated: Secondary | ICD-10-CM | POA: Insufficient documentation

## 2017-06-28 DIAGNOSIS — F319 Bipolar disorder, unspecified: Secondary | ICD-10-CM | POA: Diagnosis not present

## 2017-06-28 DIAGNOSIS — Z888 Allergy status to other drugs, medicaments and biological substances status: Secondary | ICD-10-CM | POA: Insufficient documentation

## 2017-06-28 DIAGNOSIS — Z885 Allergy status to narcotic agent status: Secondary | ICD-10-CM | POA: Insufficient documentation

## 2017-06-28 DIAGNOSIS — Z79899 Other long term (current) drug therapy: Secondary | ICD-10-CM | POA: Diagnosis not present

## 2017-06-28 DIAGNOSIS — Z880 Allergy status to penicillin: Secondary | ICD-10-CM | POA: Insufficient documentation

## 2017-06-28 DIAGNOSIS — R1110 Vomiting, unspecified: Secondary | ICD-10-CM | POA: Diagnosis not present

## 2017-06-28 MED ORDER — OSELTAMIVIR PHOSPHATE 75 MG PO CAPS: 75.0000 mg | ORAL_CAPSULE | Freq: Two times a day (BID) | ORAL | 0 refills | Status: DC

## 2017-06-28 NOTE — Progress Notes (Signed)
Subjective:  Patient ID: Franklin [name], male    DOB: July 10, 1980  Age: 37 y.o. MRN: [id number]  CC: Cough   HPI Franklin [name]  is a 37 year old male with a history of nonepileptic spells, bipolar disorder, mild cognitive impairment who presents today with a four-day history of cough which has been nonproductive and associated with chest pain, vomiting, alternating hot and cold sensations, chills. He describes a subjective fever and used OTC TheraFlu with no improvement in symptoms. Complains of lightheadedness and feels like passing out but has had no syncope; denies history of sick contacts. He did not receive the flu shot this season.  Past Medical History:  Diagnosis Date  . Bipolar 1 disorder ([place])   . Chronic headaches   . Conversion disorder with seizures or convulsions   . 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)    "raises the possibility of non-epileptic events" per Neuro MD office note 03/2015  . Depression   . Hx of electroencephalogram 12/2014   normal  . Mild cognitive impairment   . Psychogenic nonepileptic seizure   . Schizophrenic disorder ([place])   . Seizures ([place])     No past surgical history on file.  Allergies  Allergen Reactions  . Coconut Oil Anaphylaxis and Nausea And Vomiting    Reaction to any kind of coconut  . Other Anaphylaxis    raisin  . Codeine Nausea And Vomiting  . Depakote [Divalproex Sodium] Other (See Comments)    Pt states that this medication makes him feel like he is out of it.   Marland Kitchen Penicillins Nausea And Vomiting    Has patient had a PCN reaction causing immediate rash, facial/tongue/throat swelling, SOB or lightheadedness with hypotension: No Has patient had a PCN reaction causing severe rash involving mucus membranes or skin necrosis: No Has patient had a PCN reaction that required hospitalization No Has patient had a PCN reaction occurring within the last 10 years: No If all of the above answers are "NO", then may proceed with Cephalosporin  use.    . Tape Other (See Comments)    Reaction:  Tears skin, Pt is able to use paper tape.       Outpatient Medications Prior to Visit  Medication Sig Dispense Refill  . FLUoxetine (PROZAC) 20 MG tablet Take 20 mg daily by mouth.    . Oxcarbazepine (TRILEPTAL) 300 MG tablet Take 1/2 tablet in AM, 1 tablet in PM (Patient taking differently: Take 150-300 mg by mouth 2 (two) times daily. Take 1/2 tablet in AM, 1 tablet in PM) 45 tablet 6  . acetaminophen (TYLENOL) 500 MG tablet Take 1,000 mg by mouth every 6 (six) hours as needed for headache.    . cetirizine (ZYRTEC) 10 MG tablet Take 1 tablet (10 mg total) by mouth daily. (Patient not taking: Reported on 05/20/2017) 30 tablet 1   No facility-administered medications prior to visit.     ROS Review of Systems  Constitutional: Positive for fatigue. Negative for activity change and appetite change.  HENT: Negative for sinus pressure and sore throat.   Eyes: Negative for visual disturbance.  Respiratory: Positive for cough. Negative for chest tightness and shortness of breath.   Cardiovascular: Negative for chest pain and leg swelling.  Gastrointestinal: Positive for nausea and vomiting. Negative for abdominal distention, abdominal pain, constipation and diarrhea.  Endocrine: Negative.   Genitourinary: Negative for dysuria.  Musculoskeletal: Negative for joint swelling and myalgias.  Skin: Negative for rash.  Allergic/Immunologic: Negative.   Neurological: Positive for light-headedness. Negative  for weakness and numbness.  Psychiatric/Behavioral: Negative for dysphoric mood and suicidal ideas.    Objective:  BP 131/76   Pulse 67   Temp 98.4 F (36.9 C) (Oral)   Ht 6' (1.829 m)   Wt 166 lb 9.6 oz (75.6 kg)   SpO2 99%   BMI 22.60 kg/m   BP/Weight 06/28/2017 06/10/2017 94/50/3888  Systolic BP 280 034 917  Diastolic BP 76 85 83  Wt. (Lbs) 166.6 - 155  BMI 22.6 - 22.89      Physical Exam  Constitutional: He is oriented to  person, place, and time.  Ill looking  HENT:  Erythematous oropharynx with postnasal drip  Cardiovascular: Normal rate, normal heart sounds and intact distal pulses.  No murmur heard. Pulmonary/Chest: Effort normal and breath sounds normal. He has no wheezes. He has no rales. He exhibits no tenderness.  Abdominal: Soft. Bowel sounds are normal. He exhibits no distension and no mass. There is no tenderness.  Musculoskeletal: Normal range of motion.  Neurological: He is alert and oriented to person, place, and time.  Psychiatric: He has a normal mood and affect.     Assessment & Plan:   1. Influenza Treat presumptively for influenza Informed that this is contagious; avoid leaving home until 24 hours after resolution of symptoms. - Respiratory virus panel - oseltamivir (TAMIFLU) 75 MG capsule; Take 1 capsule (75 mg total) by mouth 2 (two) times daily.  Dispense: 10 capsule; Refill: 0   Meds ordered this encounter  Medications  . oseltamivir (TAMIFLU) 75 MG capsule    Sig: Take 1 capsule (75 mg total) by mouth 2 (two) times daily.    Dispense:  10 capsule    Refill:  0    Follow-up: Return if symptoms worsen or fail to improve.   Charlott Rakes MD

## 2017-06-28 NOTE — Patient Instructions (Signed)

## 2017-06-30 LAB — RESPIRATORY VIRUS PANEL
Adenovirus: NEGATIVE
INFLUENZA B 1: NEGATIVE
Influenza A: NEGATIVE
Metapneumovirus: NEGATIVE
PARAINFLUENZA 1 A: NEGATIVE
PARAINFLUENZA 2 A: NEGATIVE
Parainfluenza 3: NEGATIVE
RESPIRATORY SYNCYTIAL VIRUS A: NEGATIVE
Respiratory Syncytial Virus B: NEGATIVE
Rhinovirus: NEGATIVE

## 2017-07-01 ENCOUNTER — Telehealth: Payer: Self-pay

## 2017-07-01 NOTE — Telephone Encounter (Signed)
Patient was called and informed of lab results. 

## 2017-07-26 ENCOUNTER — Emergency Department (HOSPITAL_COMMUNITY)
Admission: EM | Admit: 2017-07-26 | Discharge: 2017-07-26 | Disposition: A | Discharge status: Home / Self Care | Payer: Medicaid Other | Attending: Emergency Medicine | Admitting: Emergency Medicine

## 2017-07-26 ENCOUNTER — Encounter (HOSPITAL_COMMUNITY): Payer: Self-pay | Admitting: Emergency Medicine

## 2017-07-26 DIAGNOSIS — R42 Dizziness and giddiness: Secondary | ICD-10-CM | POA: Diagnosis not present

## 2017-07-26 DIAGNOSIS — F250 Schizoaffective disorder, bipolar type: Secondary | ICD-10-CM | POA: Insufficient documentation

## 2017-07-26 DIAGNOSIS — Z79899 Other long term (current) drug therapy: Secondary | ICD-10-CM | POA: Diagnosis not present

## 2017-07-26 DIAGNOSIS — T50995A Adverse effect of other drugs, medicaments and biological substances, initial encounter: Secondary | ICD-10-CM | POA: Diagnosis not present

## 2017-07-26 DIAGNOSIS — T50905A Adverse effect of unspecified drugs, medicaments and biological substances, initial encounter: Secondary | ICD-10-CM

## 2017-07-26 LAB — CBC
HCT: 42.4 % (ref 39.0–52.0)
Hemoglobin: 14.5 g/dL (ref 13.0–17.0)
MCH: 30.4 pg (ref 26.0–34.0)
MCHC: 34.2 g/dL (ref 30.0–36.0)
MCV: 88.9 fL (ref 78.0–100.0)
PLATELETS: 162 10*3/uL (ref 150–400)
RBC: 4.77 MIL/uL (ref 4.22–5.81)
RDW: 12.5 % (ref 11.5–15.5)
WBC: 6.2 10*3/uL (ref 4.0–10.5)

## 2017-07-26 LAB — BASIC METABOLIC PANEL
ANION GAP: 9 (ref 5–15)
BUN: 15 mg/dL (ref 6–20)
CHLORIDE: 106 mmol/L (ref 101–111)
CO2: 25 mmol/L (ref 22–32)
CREATININE: 0.86 mg/dL (ref 0.61–1.24)
Calcium: 9.5 mg/dL (ref 8.9–10.3)
GFR calc Af Amer: >60 mL/min (ref >60)
GFR calc non Af Amer: >60 mL/min (ref >60)
Glucose, Bld: 99 mg/dL (ref 65–99)
POTASSIUM: 4.2 mmol/L (ref 3.5–5.1)
SODIUM: 140 mmol/L (ref 135–145)

## 2017-07-26 LAB — CBG MONITORING, ED: GLUCOSE-CAPILLARY: 133 mg/dL — AB (ref 65–99)

## 2017-07-26 NOTE — ED Provider Notes (Signed)
Franklin [name]   CSN: [id number] Arrival date & time: 07/26/17  1157     History   Chief Complaint Chief Complaint  Patient presents with  . Dizziness    HPI Franklin [name] is a 37 y.o. male.  The history is provided by the patient. No language interpreter was used.  Dizziness   Franklin [name] is a 37 y.o. male who presents to the Emergency Department complaining of dizziness.  He has a history of bipolar disorder and schizophrenia and was started on risperidone 2 mg last night.  He took his nighttime dose when he awoke this morning he felt dizzy and sleepy.  He went back to sleep and when he got up he continued to feel dizzy with generalized weakness.  Dizziness is described as a lightheadedness.  He denies any headache, chest pain, fevers, shortness of breath, nausea, vomiting.  No prior similar symptoms.  Symptoms are mild to moderate and constant nature. Past Medical History:  Diagnosis Date  . Bipolar 1 disorder ([place])   . Chronic headaches   . Conversion disorder with seizures or convulsions   . 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 Depression   . Hx of electroencephalogram 12/2014   normal  . Mild cognitive impairment   . Psychogenic nonepileptic seizure   . Schizophrenic disorder ([place])   . Seizures [facility])     Patient Active Problem List   Diagnosis Date Noted  . Psychogenic nonepileptic seizure 02/18/2017  . Cholelithiasis 01/24/2017  . 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 Abnormal EKG 11/30/2015  . Elevated troponin 11/30/2015  . Bipolar affective disorder, most recent episode unspecified type, remission status unspecified 03/11/2015  . Bipolar affective disorder ([place]) 01/08/2015  . 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 Seizure  disorder ([place]) 10/14/2014    History reviewed. No pertinent surgical history.     Home Medications    Prior to Admission medications   Medication Sig Start Date End Date Taking? Authorizing Provider  Oxcarbazepine (TRILEPTAL) 300 MG tablet Take 1/2 tablet in AM, 1 tablet in PM Patient taking differently: Take 150-300 mg by mouth 2 (two) times daily. Take 1/2 tablet in AM, 1 tablet in PM 04/18/17  Yes Cameron Sprang, MD  risperiDONE (RISPERDAL) 2 MG tablet Take 2 mg by mouth at bedtime.   Yes [provider]  cetirizine (ZYRTEC) 10 MG tablet Take 1 tablet (10 mg total) by mouth daily. Patient not taking: Reported on 07/26/2017 02/18/17   Charlott Rakes, MD  oseltamivir (TAMIFLU) 75 MG capsule Take 1 capsule (75 mg total) by mouth 2 (two) times daily. Patient not taking: Reported on 07/26/2017 06/28/17   Charlott Rakes, MD    Family History Family History  Problem Relation Age of Onset  . Heart disease Mother     Social History Social History   Tobacco Use  . Smoking status: Never Smoker  . Smokeless tobacco: Never Used  Substance Use Topics  . Alcohol use: Yes    Alcohol/week: 0.0 oz    Comment: 40oz today  . Drug use: No     Allergies   Coconut oil; Other; Codeine; Depakote [divalproex sodium]; Penicillins; and Tape   Review of Systems Review of Systems  Neurological: Positive for dizziness.  All other systems reviewed and are negative.    Physical Exam Updated Vital  Signs BP 128/77   Pulse 73   Temp 97.6 F (36.4 C) (Oral)   Resp 18   Ht 6' (1.829 m)   Wt 70.3 kg (155 lb)   SpO2 100%   BMI 21.02 kg/m   Physical Exam  Constitutional: He is oriented to person, place, and time. He appears well-developed and well-nourished.  HENT:  Head: Normocephalic and atraumatic.  Eyes: EOM are normal. Pupils are equal, round, and reactive to light.  Cardiovascular: Normal rate and regular rhythm.  No murmur heard. Pulmonary/Chest: Effort normal and  breath sounds normal. No respiratory distress.  Abdominal: Soft. There is no tenderness. There is no rebound and no guarding.  Musculoskeletal: He exhibits no edema or tenderness.  Neurological: He is alert and oriented to person, place, and time. No cranial nerve deficit. Coordination normal.  No ataxia on FTN bilaterally.  5/5 strength in all four extremities.  Sensation to light touch intact in all four extremities.    Skin: Skin is warm and dry.  Psychiatric: He has a normal mood and affect. His behavior is normal.  Nursing [name] and vitals reviewed.    ED Treatments / Results  Labs (all labs ordered are listed, but only abnormal results are displayed) Labs Reviewed  CBG MONITORING, ED - Abnormal; Notable for the following components:      Result Value   Glucose-Capillary 133 (*)    All other components within normal limits  BASIC METABOLIC PANEL  CBC  URINALYSIS, ROUTINE W REFLEX MICROSCOPIC    EKG  EKG Interpretation  Date/Time:  Tuesday July 26 2017 12:17:56 EST Ventricular Rate:  54 PR Interval:    QRS Duration: 113 QT Interval:  406 QTC Calculation: 385 R Axis:   -12 Text Interpretation:  Sinus rhythm Borderline intraventricular conduction delay ST elev, probable normal early repol pattern No significant change since last tracing Confirmed by Quintella Reichert [phone]) on 07/26/2017 2:10:48 PM       Radiology No results found.  Procedures Procedures (including critical care time)  Medications Ordered in ED Medications - No data to display   Initial Impression / Assessment and Plan / ED Course  I have reviewed the triage vital signs and the nursing notes.  Pertinent labs & imaging results that were available during my care of the patient were reviewed by me and considered in my medical decision making (see chart for details).     Patient here with feelings of dizziness, lightheadedness and fatigue after starting risperidone last night.  EKG is unchanged  compared to priors.  No orthostasis in the department and no focal neurologic deficits.  Presentation is not consistent with ACS, arrhythmia, CVA.  Discussed with patient home care for medication reaction.  Recommend discontinuing risperidone for now with oral fluid hydration and rest.  Discussed outpatient follow-up and return precautions.  Final Clinical Impressions(s) / ED Diagnoses   Final diagnoses:  Dizziness    ED Discharge Orders    None       Quintella Reichert, MD 07/26/17 1438

## 2017-07-26 NOTE — Discharge Instructions (Signed)
Do not take your risperdone today.  Follow up with you psychiatrist about adjusting your medications.  Drink plenty of fluids.  Get rechecked immediately if you have any new or concerning symptoms.

## 2017-07-26 NOTE — ED Notes (Signed)
ED Provider at bedside. 

## 2017-07-26 NOTE — ED Notes (Signed)
Patient provided with urinal and made aware urine sample is needed. 

## 2017-07-26 NOTE — ED Triage Notes (Addendum)
Per [facility] pt coming from bus stop. Reports starting new medication yesterday risperdal, now having weakness and dizziness onset of this 930 am. No neuro deficits noted in triage. Hx of seizures.

## 2017-08-02 ENCOUNTER — Encounter (HOSPITAL_COMMUNITY): Payer: Self-pay | Admitting: Emergency Medicine

## 2017-08-02 ENCOUNTER — Emergency Department (HOSPITAL_COMMUNITY): Payer: Medicaid Other

## 2017-08-02 DIAGNOSIS — Z5321 Procedure and treatment not carried out due to patient leaving prior to being seen by health care provider: Secondary | ICD-10-CM | POA: Diagnosis not present

## 2017-08-02 DIAGNOSIS — R0789 Other chest pain: Secondary | ICD-10-CM | POA: Diagnosis present

## 2017-08-02 NOTE — ED Triage Notes (Addendum)
Per EMS, patient from home, c/o chest wall pain worsening with palpation x15 minutes. Denies SOB, headache, dizziness, N/V. Ambulatory. Reports taking aspirin prior to EMS arrival.

## 2017-08-02 NOTE — ED Notes (Signed)
Unable to collect lkabs patient did not respond when calling his name in lobby for blood work.

## 2017-08-02 NOTE — ED Notes (Signed)
Pt called from the lobby with no response 

## 2017-08-03 ENCOUNTER — Emergency Department (HOSPITAL_COMMUNITY)
Admission: EM | Admit: 2017-08-03 | Discharge: 2017-08-03 | Discharge status: Against Medical Advice | Payer: Medicaid Other | Attending: Emergency Medicine | Admitting: Emergency Medicine

## 2017-08-03 NOTE — ED Notes (Signed)
Pt called from the lobby with no response x3 

## 2017-08-21 ENCOUNTER — Emergency Department (HOSPITAL_COMMUNITY)
Admission: EM | Admit: 2017-08-21 | Discharge: 2017-08-21 | Disposition: A | Discharge status: Home / Self Care | Payer: Medicaid Other | Attending: Emergency Medicine | Admitting: Emergency Medicine

## 2017-08-21 DIAGNOSIS — R569 Unspecified convulsions: Secondary | ICD-10-CM | POA: Insufficient documentation

## 2017-08-21 LAB — COMPREHENSIVE METABOLIC PANEL
ALT: 13 U/L — AB (ref 17–63)
AST: 20 U/L (ref 15–41)
Albumin: 4 g/dL (ref 3.5–5.0)
Alkaline Phosphatase: 56 U/L (ref 38–126)
Anion gap: 8 (ref 5–15)
BUN: 15 mg/dL (ref 6–20)
CHLORIDE: 106 mmol/L (ref 101–111)
CO2: 24 mmol/L (ref 22–32)
CREATININE: 0.9 mg/dL (ref 0.61–1.24)
Calcium: 9 mg/dL (ref 8.9–10.3)
GFR calc non Af Amer: >60 mL/min (ref >60)
GLUCOSE: 81 mg/dL (ref 65–99)
Potassium: 3.7 mmol/L (ref 3.5–5.1)
SODIUM: 138 mmol/L (ref 135–145)
Total Bilirubin: 1.5 mg/dL — ABNORMAL HIGH (ref 0.3–1.2)
Total Protein: 6.6 g/dL (ref 6.5–8.1)

## 2017-08-21 LAB — CBC WITH DIFFERENTIAL/PLATELET
BASOS ABS: 0 10*3/uL (ref 0.0–0.1)
BASOS PCT: 0 %
EOS ABS: 0 10*3/uL (ref 0.0–0.7)
EOS PCT: 1 %
HCT: 38.8 % — ABNORMAL LOW (ref 39.0–52.0)
Hemoglobin: 13 g/dL (ref 13.0–17.0)
LYMPHS ABS: 1.2 10*3/uL (ref 0.7–4.0)
Lymphocytes Relative: 27 %
MCH: 29.5 pg (ref 26.0–34.0)
MCHC: 33.5 g/dL (ref 30.0–36.0)
MCV: 88 fL (ref 78.0–100.0)
Monocytes Absolute: 0.3 10*3/uL (ref 0.1–1.0)
Monocytes Relative: 8 %
NEUTROS PCT: 64 %
Neutro Abs: 2.7 10*3/uL (ref 1.7–7.7)
PLATELETS: 175 10*3/uL (ref 150–400)
RBC: 4.41 MIL/uL (ref 4.22–5.81)
RDW: 12.5 % (ref 11.5–15.5)
WBC: 4.3 10*3/uL (ref 4.0–10.5)

## 2017-08-21 LAB — I-STAT CG4 LACTIC ACID, ED: Lactic Acid, Venous: 0.95 mmol/L (ref 0.5–1.9)

## 2017-08-21 MED ORDER — SODIUM CHLORIDE 0.9 % IV BOLUS (SEPSIS)
1000.0000 mL | Freq: Once | INTRAVENOUS | Status: AC
  Administered 2017-08-21: 1000 mL via INTRAVENOUS

## 2017-08-21 MED ORDER — HALOPERIDOL LACTATE 5 MG/ML IJ SOLN
2.0000 mg | Freq: Once | INTRAMUSCULAR | Status: AC
  Administered 2017-08-21: 2 mg via INTRAVENOUS
  Filled 2017-08-21: qty 1

## 2017-08-21 MED ORDER — LEVETIRACETAM IN NACL 1000 MG/100ML IV SOLN
1000.0000 mg | Freq: Once | INTRAVENOUS | Status: AC
  Administered 2017-08-21: 1000 mg via INTRAVENOUS
  Filled 2017-08-21: qty 100

## 2017-08-21 NOTE — ED Triage Notes (Signed)
Per [facility] patient coming from home complaining of seizures for the past 4 days. Hx of seizures. Family reports patient is compliant with medications. Ems witnessed a seizure and administered 5 mg IM of versed. No seizure activity since. Patient alert and oriented x 4 on arrival.

## 2017-08-21 NOTE — ED Notes (Signed)
Pt departed in NAD, refused use of wheelchair.  

## 2017-08-21 NOTE — ED Provider Notes (Signed)
[facility] EMERGENCY DEPARTMENT Provider Note   CSN: [id number] Arrival date & time: 08/21/17  1441     History   Chief Complaint Chief Complaint  Patient presents with  . Seizures    HPI Franklin [name] is a 37 y.o. male.  HPI   37 year old male with a history of schizophrenia, bipolar, psychogenic nonepileptic seizures, presents with concern for seizure-like activity.  Family had reported approximately 4 seizures over the last several days.  Wife reports he had an episode today where he was staring off and shaking in both arms while he was still awake, but was not responding to her normally.  He also had right hand shaking at one time. 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  He had prior monitoring with typical movements found to be nonepileptic, however was unable to complete entire study and there remained some question of possible other underlying seizure disorder--although his neurologist felt this was less likely.  Wife reports they have been under more stress due to high rent, searching for new place to live.  He has otherwise been in normal state of health> no SI or HI.  Per EMS, on their arrival he was staring, standing and awake, with shaking in his bilateral upper extremities.  zHe then appeared to become more out of it with shaking and they gave versed. Family reported his seizure like activity lasted 5-7 minutes at home.  Past Medical History:  Diagnosis Date  . Bipolar 1 disorder ([place])   . Chronic headaches   . Conversion disorder with seizures or convulsions   . 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 Depression   . Hx of electroencephalogram 12/2014   normal  . Mild cognitive impairment   . Psychogenic nonepileptic seizure   . Schizophrenic disorder ([place])   . Seizures [facility])     Patient Active Problem List   Diagnosis Date Noted  .  Psychogenic nonepileptic seizure 02/18/2017  . Cholelithiasis 01/24/2017  . 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 Abnormal EKG 11/30/2015  . Elevated troponin 11/30/2015  . Bipolar affective disorder, most recent episode unspecified type, remission status unspecified 03/11/2015  . Bipolar affective disorder ([place]) 01/08/2015  . Generalized idiopathic epilepsy and epileptic syndromes, without status epilepticus, not intractable ([place]) 11/27/2014  . Seizure disorder ([place]) 10/14/2014    No past surgical history on file.      Home Medications    Prior to Admission medications   Medication Sig Start Date End Date Taking? Authorizing Provider  cetirizine (ZYRTEC) 10 MG tablet Take 1 tablet (10 mg total) by mouth daily. Patient not taking: Reported on 08/21/2017 02/18/17   Charlott Rakes, MD  oseltamivir (TAMIFLU) 75 MG capsule Take 1 capsule (75 mg total) by mouth 2 (two) times daily. Patient not taking: Reported on 07/26/2017 06/28/17   Charlott Rakes, MD  Oxcarbazepine (TRILEPTAL) 300 MG tablet Take 1/2 tablet in AM, 1 tablet in PM Patient not taking: Reported on 08/21/2017 04/18/17   Cameron Sprang, MD    Family History Family History  Problem Relation Age of Onset  . Heart disease Mother     Social History Social History   Tobacco Use  . Smoking status: Never Smoker  . Smokeless tobacco: Never Used  Substance Use Topics  . Alcohol use: Yes    Alcohol/week: 0.0 oz    Comment: 40oz today  .  Drug use: No     Allergies   Coconut oil; Other; Codeine; Depakote [divalproex sodium]; Penicillins; and Tape   Review of Systems Review of Systems  Constitutional: Negative for fever.  HENT: Negative for sore throat.   Eyes: Negative for visual disturbance.  Respiratory: Negative for shortness of breath.   Cardiovascular: Negative for chest pain.  Gastrointestinal: Negative for abdominal pain.  Genitourinary: Negative for difficulty urinating.    Musculoskeletal: Negative for back pain and neck stiffness.  Skin: Negative for rash.  Neurological: Positive for seizures. Negative for syncope (family denies) and headaches.     Physical Exam Updated Vital Signs BP 102/61   Pulse (!) 50   Temp 98.8 F (37.1 C) (Oral)   Resp 12   Ht (!) 6" (0.152 m)   Wt 70.3 kg (155 lb)   SpO2 97%   BMI 3027.14 kg/m   Physical Exam  Constitutional: He is oriented to person, place, and time. He appears well-developed and well-nourished. No distress.  HENT:  Head: Normocephalic and atraumatic.  Eyes: Conjunctivae and EOM are normal.  Neck: Normal range of motion.  Cardiovascular: Normal rate, regular rhythm, normal heart sounds and intact distal pulses. Exam reveals no gallop and no friction rub.  No murmur heard. Pulmonary/Chest: Effort normal and breath sounds normal. No respiratory distress. He has no wheezes. He has no rales.  Abdominal: Soft. He exhibits no distension. There is no tenderness. There is no guarding.  Musculoskeletal: He exhibits no edema.  Neurological: He is alert and oriented to person, place, and time. He has normal strength. No cranial nerve deficit or sensory deficit. Coordination normal. GCS eye subscore is 4. GCS verbal subscore is 5. GCS motor subscore is 6.  Skin: Skin is warm and dry. He is not diaphoretic.  Nursing note and vitals reviewed.    ED Treatments / Results  Labs (all labs ordered are listed, but only abnormal results are displayed) Labs Reviewed  CBC WITH DIFFERENTIAL/PLATELET - Abnormal; Notable for the following components:      Result Value   HCT 38.8 (*)    All other components within normal limits  COMPREHENSIVE METABOLIC PANEL - Abnormal; Notable for the following components:   ALT 13 (*)    Total Bilirubin 1.5 (*)    All other components within normal limits  I-STAT CG4 LACTIC ACID, ED    EKG EKG Interpretation  Date/Time:  Sunday August 21 2017 15:21:27 EDT Ventricular Rate:   60 PR Interval:    QRS Duration: 115 QT Interval:  392 QTC Calculation: 392 R Axis:   -12 Text Interpretation:  Sinus rhythm Nonspecific intraventricular conduction delay ST elev, probable normal early repol pattern No significant change since last tracing Confirmed by ,  ([id number]) on 08/21/2017 6:23:00 PM   Radiology No results found.  Procedures Procedures (including critical care time)  Medications Ordered in ED Medications  sodium chloride 0.9 % bolus 1,000 mL (0 mLs Intravenous Stopped 08/21/17 1709)  levETIRAcetam (KEPPRA) IVPB 1000 mg/100 mL premix (0 mg Intravenous Stopped 08/21/17 1545)  haloperidol lactate (HALDOL) injection 2 mg (2 mg Intravenous Given 08/21/17 1909)     Initial Impression / Assessment and Plan / ED Course  I have reviewed the triage vital signs and the nursing notes.  Pertinent labs & imaging results that were available during my care of the patient were reviewed by me and considered in my medical decision making (see chart for details).     36  year old male with a history  of schizophrenia, bipolar, psychogenic nonepileptic seizures, presents with concern for seizure-like activity.   Patient has a documented history of psychogenic nonepileptic seizures, with his neurologist feeling this is most likely etiology of his seizure-like episodes, with stereotyped movements noted during his sleep study without signs of epilepsy, however noted that the study was somewhat limited as patient was unable to complete it due to agitated behavior.  Given unclear if there is possible true seizure-like activity by history and patient arrival, he was given 1000 mg of Keppra.   Patient was noted to return to baseline, however on my reevaluation, is staring.  At the time he is staring, he is responsive to outside stimuli, including painful stimuli as well as blinking to threat, and feel that this behavior is most consistent with psychogenic nonepileptic seizures.  In  addition, his labs were obtained which shows no significant abnormalities, including a normal lactic acid which would seem unlikely in the setting of reported history of 5-7-minute seizure at home followed by another seizure with EMS.  Gave patient 2 mg of Haldol, with improvement of his mental status, patient eating and requesting to go home.  Do feel that history and lab work are most consistent with psychogenic nonepileptic seizures in the setting of patient being under more significant stress due to financial concerns.  Recommend following up with [facility], as well as following up with his neurologist, Dr. Delice Lesch.  Do not recommend any changes in medication at this time.    Final Clinical Impressions(s) / ED Diagnoses   Final diagnoses:  Seizure-like activity [facility])    ED Discharge Orders    None       Gareth Morgan, MD 08/22/17 [phone]

## 2017-08-21 NOTE — ED Notes (Addendum)
Pt urinated in bed, cleaned up pt, pt comfortable, call bed was in reach informed pt to use it next time

## 2017-08-22 ENCOUNTER — Encounter (HOSPITAL_COMMUNITY): Payer: Self-pay | Admitting: Emergency Medicine

## 2017-08-22 ENCOUNTER — Emergency Department (HOSPITAL_COMMUNITY)
Admission: EM | Admit: 2017-08-22 | Discharge: 2017-08-22 | Disposition: A | Discharge status: Home / Self Care | Payer: Medicaid Other | Attending: Emergency Medicine | Admitting: Emergency Medicine

## 2017-08-22 DIAGNOSIS — Z5321 Procedure and treatment not carried out due to patient leaving prior to being seen by health care provider: Secondary | ICD-10-CM | POA: Diagnosis not present

## 2017-08-22 DIAGNOSIS — R55 Syncope and collapse: Secondary | ICD-10-CM | POA: Diagnosis not present

## 2017-08-22 LAB — BASIC METABOLIC PANEL
ANION GAP: 8 (ref 5–15)
BUN: 10 mg/dL (ref 6–20)
CALCIUM: 9 mg/dL (ref 8.9–10.3)
CO2: 25 mmol/L (ref 22–32)
Chloride: 104 mmol/L (ref 101–111)
Creatinine, Ser: 0.76 mg/dL (ref 0.61–1.24)
GFR calc Af Amer: >60 mL/min (ref >60)
GLUCOSE: 157 mg/dL — AB (ref 65–99)
Potassium: 4.3 mmol/L (ref 3.5–5.1)
Sodium: 137 mmol/L (ref 135–145)

## 2017-08-22 LAB — CBC
HCT: 41.4 % (ref 39.0–52.0)
Hemoglobin: 13.9 g/dL (ref 13.0–17.0)
MCH: 29.1 pg (ref 26.0–34.0)
MCHC: 33.6 g/dL (ref 30.0–36.0)
MCV: 86.6 fL (ref 78.0–100.0)
Platelets: 188 10*3/uL (ref 150–400)
RBC: 4.78 MIL/uL (ref 4.22–5.81)
RDW: 12.3 % (ref 11.5–15.5)
WBC: 5.7 10*3/uL (ref 4.0–10.5)

## 2017-08-22 NOTE — ED Notes (Signed)
PT called to take back to a room with no response.  Pt not in triage hallway or waiting room.

## 2017-08-22 NOTE — ED Triage Notes (Signed)
PT reports feeling light headiness and having a syncopal episode at home after he left the ED yesterday.  Pt Pt believes he is having an reaction to the haldol.  Pt is brady, b/t 48 -54 bpm.  Pt also c/o of abdominal pain, tender and guarding upon pal.

## 2017-08-22 NOTE — ED Notes (Signed)
Pt called to go to room, x2 no answer.

## 2017-08-23 ENCOUNTER — Ambulatory Visit (INDEPENDENT_AMBULATORY_CARE_PROVIDER_SITE_OTHER): Payer: Medicaid Other | Admitting: Neurology

## 2017-08-23 ENCOUNTER — Other Ambulatory Visit: Payer: Self-pay

## 2017-08-23 ENCOUNTER — Encounter: Payer: Self-pay | Admitting: Neurology

## 2017-08-23 VITALS — BP 120/68 | HR 53 | Ht 72.0 in | Wt 164.0 lb

## 2017-08-23 DIAGNOSIS — F319 Bipolar disorder, unspecified: Secondary | ICD-10-CM | POA: Diagnosis not present

## 2017-08-23 DIAGNOSIS — F445 Conversion disorder with seizures or convulsions: Secondary | ICD-10-CM

## 2017-08-23 DIAGNOSIS — R42 Dizziness and giddiness: Secondary | ICD-10-CM | POA: Diagnosis not present

## 2017-08-23 MED ORDER — OMEPRAZOLE 40 MG PO CPDR: 40.0000 mg | DELAYED_RELEASE_CAPSULE | Freq: Every day | ORAL | 6 refills | Status: DC

## 2017-08-23 MED ORDER — OXCARBAZEPINE 300 MG PO TABS
ORAL_TABLET | ORAL | 11 refills | Status: DC
Start: 2017-08-23 — End: 2018-01-26

## 2017-08-23 NOTE — Patient Instructions (Addendum)
1. Call our office to let us know the name and number of your psychiatrist and therapist at [facility] so we can work together to help better with your stress seizures 2. We will send the order for Vestibular therapy to help with the dizziness 3. Start omeprazole 40mg  every morning to help protect stomach 4. Continue oxcarbazepine (Trileptal) 300mg : Take 1/2 tablet twice a day 5. Follow-up in 6 months, call for any changes

## 2017-08-23 NOTE — Progress Notes (Signed)
NEUROLOGY FOLLOW UP OFFICE NOTE  Franklin [name] [id number]  DOB: 03/05/2016  HISTORY OF PRESENT ILLNESS: I had the pleasure of seeing Franklin [name] in follow-up in the neurology clinic on 08/23/2017. The patient was last seen 4 months ago for psychogenic non-epileptic events (PNES). He is alone in the office today. He states that he was doing well with no events from December 2018 until last week, when he started having recurrent episodes of his typical seizures (previously captured at [name]) with staring and shaking of extremities. He was in the ER 2 days ago where an episode was witnessed in the ER with staring, but responsive to blink to threat and painful stimuli, behavior most consistent with PNES. Labs were also normal, including a normal lactic acid level. He was given IV Keppra and Haldol. He reports the medications he was given "doped me up." He was back in the ER yesterday but left AMA. He reports going regularly to [name], seeing both a psychiatrist and a therapist every 2 weeks. Mood is "so-so." He is taking oxcarbazepine 150mg  BID prescribed for mood stabilization. He also reports being on an unrecalled anti-depressant. He denies any recent stress, but in the ER his wife endorsed stress with high rent and looking for a new home. He reports feeling lightheaded and dizzy with room spinning all the time, even without a seizure. When he gets up, his balance is off. Dizziness is worse when turning in bed. He also has nausea and vomiting 30 minutes after eating, he reports abdominal pain with this.   HPI: This is a 37 yo RH man with a history of bipolar disorder, mild cognitive impairment, and seizures since age 66 or 85. He and his cousin both report that seizures would start with staring and unresponsiveness, followed by low amplitude shaking of both hands. Sometimes he would have violent leg kicking. They state that shaking last from 30 minutes up to "1-1/2 hours" followed by confusion for  around 20 minutes. He would be sleepy after a seizure. He has had urinary incontinence with some of them, no tongue bite or other significant injuries. He reports all seizures occur during wakefulness, no nocturnal seizures that he is aware of, but woke up one time with urinary incontinence. He denies any olfactory/gustatory hallucinations, deja vu, rising epigastric sensation, focal numbness/tingling/weakness, myoclonic jerks. He occasionally drops things from his hands. He reports that Depakote was started at age 20, however he has only been taking 250mg /day, stating that when he was on 500 or 750mg , he would be "like a zombie, walking into doors." The low dose Depakote has not controlled the seizures, he reports seizures occurring every 3-4 days, longest seizure-free interval probably 1-2 months.   On review of records on EPIC, he has been to the ER several times since 2008 with no note of a seizure history until 2014. There are some records of patient being on Depakote, Tegretol, and Haldol, presumably for Bipolar disorder. He reports chronic headaches occurring 2-3 times a week with sharp pain in the back of his head, "like a needle sticking in it" lasting 30-45 minutes. He would feel lightheaded. No associated nausea, vomiting, photo/phonophobia, or visual obscurations. He takes Tylenol or aspirin with good effect. He lives with his girlfriend. He finished 12th grade in special education classes. He does not drive.  He was monitored at [name] in October 2017 for several hours, but he became progressively agitated and uncooperative. He did have 3 typical spells confirmed by his cousin as  typical events. He was kicking his legs, unresponsive, staring off, flailing around the bed during one event. The second episode he was noted to have right hand shaking, trying to get out of bed, fidgeting around the bed. The third push button was for fighting the nurses and getting agitated, pulling his IV out. All three  episodes did not show any EEG correlate. It was noted that it is unclear whether all his spells are non-epileptic, however baseline uncontrolled psychosis made him unable to comply with [name] monitoring to continue testing  Epilepsy Risk Factors: His maternal uncle used to have seizures. He was born premature, with mild cognitive impairment. Otherwise he denies any history of febrile convulsions, CNS infections such as meningitis/encephalitis, significant traumatic brain injury, neurosurgical procedures.  PAST MEDICAL HISTORY: Past Medical History:  Diagnosis Date  . Bipolar 1 disorder ([place]lin [name])   . Chronic headaches   . Conversion disorder with seizures or convulsions   . 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 Depression   . Hx of electroencephalogram 12/2014   normal  . Mild cognitive impairment   . Psychogenic nonepileptic seizure   . Schizophrenic disorder ([place])   . Seizures [name])     MEDICATIONS:   Outpatient Encounter Medications as of 08/23/2017  Medication Sig  . Oxcarbazepine (TRILEPTAL) 300 MG tablet Take 1/2 tablet in AM, 1 tablet in PM  . cetirizine (ZYRTEC) 10 MG tablet Take 1 tablet (10 mg total) by mouth daily. (Patient not taking: Reported on 08/23/2017)  . oseltamivir (TAMIFLU) 75 MG capsule Take 1 capsule (75 mg total) by mouth 2 (two) times daily. (Patient not taking: Reported on 07/26/2017)   No facility-administered encounter medications on file as of 08/23/2017.      ALLERGIES: Allergies  Allergen Reactions  . Coconut Oil Anaphylaxis and Nausea And Vomiting    Reaction to any kind of coconut  . Other Anaphylaxis    raisin  . Codeine Nausea And Vomiting  . Depakote [Divalproex Sodium] Other (See Comments)    Pt states that this medication makes him feel like he is out of it.   Marland Kitchen Penicillins Nausea And Vomiting    Has patient had a PCN reaction causing immediate rash,  facial/tongue/throat swelling, SOB or lightheadedness with hypotension: No Has patient had a PCN reaction causing severe rash involving mucus membranes or skin necrosis: No Has patient had a PCN reaction that required hospitalization No Has patient had a PCN reaction occurring within the last 10 years: No If all of the above answers are "NO", then may proceed with Cephalosporin use.    . Tape Other (See Comments)    Reaction:  Tears skin, Pt is able to use paper tape.      FAMILY HISTORY: Family History  Problem Relation Age of Onset  . Heart disease Mother     SOCIAL HISTORY: Social History   Socioeconomic History  . Marital status: Single    Spouse name: Not on file  . Number of children: Not on file  . Years of education: Not on file  . Highest education level: Not on file  Occupational History  . Not on file  Social Needs  . Financial resource strain: Not on file  . Food insecurity:    Worry: Not on file    Inability: Not on file  . Transportation needs:    Medical: Not on file    Non-medical: Not on file  Tobacco Use  . Smoking status: Never Smoker  . Smokeless tobacco: Never Used  Substance and Sexual Activity  . Alcohol use: Yes    Alcohol/week: 0.0 oz    Comment: 40oz today  . Drug use: No  . Sexual activity: Not Currently    Birth control/protection: Condom  Lifestyle  . Physical activity:    Days per week: Not on file    Minutes per session: Not on file  . Stress: Not on file  Relationships  . Social connections:    Talks on phone: Not on file    Gets together: Not on file    Attends religious service: Not on file    Active member of club or organization: Not on file    Attends meetings of clubs or organizations: Not on file    Relationship status: Not on file  . Intimate partner violence:    Fear of current or ex partner: Not on file    Emotionally abused: Not on file    Physically abused: Not on file    Forced sexual activity: Not on file    Other Topics Concern  . Not on file  Social History Narrative  . Not on file    REVIEW OF SYSTEMS: Constitutional: No fevers, chills, or sweats, no generalized fatigue, change in appetite Eyes: No visual changes, double vision, eye pain Ear, nose and throat: No hearing loss, ear pain, nasal congestion, sore throat Cardiovascular: No chest pain, palpitations Respiratory:  No shortness of breath at rest or with exertion, wheezes GastrointestinaI: No nausea, vomiting, diarrhea, abdominal pain, fecal incontinence Genitourinary:  No dysuria, urinary retention Musculoskeletal:  No neck pain, back pain Integumentary: No rash, pruritus, skin lesions Neurological: as above Psychiatric: No depression, insomnia, anxiety Endocrine: No palpitations, fatigue, diaphoresis, mood swings, change in appetite, change in weight, increased thirst Hematologic/Lymphatic:  No anemia, purpura, petechiae. Allergic/Immunologic: no itchy/runny eyes, nasal congestion, recent allergic reactions, rashes  PHYSICAL EXAM: Vitals:   08/23/17 0834  BP: 120/68  Pulse: (!) 53  SpO2: 99%   General: No acute distress, more cooperative in office today Head:  Normocephalic/atraumatic Neck: supple, no paraspinal tenderness, full range of motion Heart:  Regular rate and rhythm Lungs:  Clear to auscultation bilaterally Back: No paraspinal tenderness Skin/Extremities: No rash, no edema Neurological Exam: alert and oriented to person, place, and time. No aphasia or dysarthria. Fund of knowledge is appropriate.  Recent and remote memory are intact.  Attention and concentration are normal.    Able to name objects and repeat phrases. Cranial nerves: Pupils equal, round, reactive to light.  Extraocular movements intact with no nystagmus. Visual fields full. Facial sensation intact. No facial asymmetry. Tongue, uvula, palate midline.  Motor: Bulk and tone normal, muscle strength 5/5 throughout with no pronator drift.  Sensation to  light touch intact.  No extinction to double simultaneous stimulation.  Deep tendon reflexes 2+ throughout, toes downgoing.  Finger to nose testing intact.  Gait wide-based, mild difficulty with tandem walk but able, no ataxia. Romberg negative.  IMPRESSION: This is a 37 yo RH man with a history of bipolar disorder, mild cognitive impairment, with a report of history of seizures since age 26 or 45. Review of records from ER visits in the past have no indication of seizures or seizure medication until 2014. He was admitted at [facility] for vEEG monitoring but only stayed for a few hours due to increasing agitation. He did have 3 psychogenic non-epileptic events (PNES) with staring/unresponsiveness, right sided  shaking, and shaking/flailing on bed. It is most likely that his seizures are all non-epileptic, however a complete [name] stay could not be done due to aggressive behavior. He is alone today, reporting several months of being symptom-free, until recently when shaking episodes recurred with several ER visits. We again discussed the diagnosis of PNES, he seems to express understanding and has been going to [place] more regularly now. I have requested he provide information about his psychiatrist and therapist so I can discuss diagnosis with them, he is on the oxcarbazepine 150mg  BID for mood stabilization and not seizures. He is also reporting dizziness that appears positional at times and will be referred for vestibular therapy. He reports nausea/vomiting after eating and will try taking omeprazole. He does not drive. Follow-up in 6 months, he knows to call for any changes.   Thank you for allowing me to participate in his care.  Please do not hesitate to call for any questions or concerns.  The duration of this appointment visit was 25 minutes of face-to-face time with the patient.  Greater than 50% of this time was spent in counseling, explanation of diagnosis, planning of further management, and  coordination of care.   Ellouise Newer, M.D.  CC: Dr. [name]

## 2017-08-25 ENCOUNTER — Ambulatory Visit: Payer: Medicaid Other | Attending: Neurology | Admitting: Physical Therapy

## 2017-08-25 ENCOUNTER — Encounter: Payer: Self-pay | Admitting: Physical Therapy

## 2017-08-25 ENCOUNTER — Other Ambulatory Visit: Payer: Self-pay

## 2017-08-25 DIAGNOSIS — R42 Dizziness and giddiness: Secondary | ICD-10-CM | POA: Diagnosis not present

## 2017-08-26 NOTE — Therapy (Signed)
Franklin [name] [place], [place], [address] Phone: [phone]   Fax:  [phone]  Physical Therapy Evaluation and Discharge  Patient Details  Name: Franklin [name] MRN: [id number] Date of Birth: May 19, 1981 Referring Provider: Cameron Sprang, MD   Encounter Date: 08/25/2017  PT End of Session - 08/26/17 1328    Visit Number  1    Number of Visits  1    Authorization Type  Medicaid    Authorization Time Period  NA eval only    PT Start Time  1315    PT Stop Time  1400    PT Time Calculation (min)  45 min    Activity Tolerance  Patient tolerated treatment well    Behavior During Therapy  Flat affect no change in expression/posture/behavior when reports spinning starts       Past Medical History:  Diagnosis Date  . Bipolar 1 disorder ([place])   . Chronic headaches   . Conversion disorder with seizures or convulsions   . 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 Depression   . Hx of electroencephalogram 12/2014   normal  . Mild cognitive impairment   . Psychogenic nonepileptic seizure   . Schizophrenic disorder ([place])   . Seizures ([place])     History reviewed. No pertinent surgical history.  There were no vitals filed for this visit.   Subjective Assessment - 08/25/17 1320    Subjective  She (physician) told me to come because I'm dizzy. Patient reports dizziness since he was a teenager.     Currently in Pain?  No/denies                    Objective measurements completed on examination: See above findings.              PT Education - 08/26/17 1327    Education provided  Yes    Education Details  results of evaluation are not consistent with vestibular cause of dizziness; no further PT indicated    Person(s) Educated  Patient;Spouse    Methods  Explanation    Comprehension  Verbalized understanding                   Plan - 08/26/17 1330    Clinical Impression Statement  Patient referred for PT evaluation due to dizziness. Patient reports longstanding problems with dizziness--sometimes spinning sensation that lasts 15-20 minutes or lightheaded feeling. Patient's testing was not consitent with vestibular cause to his dizziness. Able to elicit "spinning" with Lt Hallpike-Dix, however while pt maintained in position he calmly reports spinning starts and "gets faster" with no nystagmus noted (even with use of Frenzel lenses), then spinning stops, then resumes for minutes at a time. Smooth pursuits were normal, however did cause pt to feel "lightheaded." Sit to stand pt reported feeling spinning, however again with no change in facial expression or other body language to indicate he felt spinning (no imbalance, no closing eyes, no reaching to hold onto someone/something). Patient with negative test of skew, VORx1 exercise (he could not relax to allow passive head movement for head impulse test), and negative VOR cancellation. Overall, signs and symptoms are not consistent with a vestibular cause to his dizziness and therefore PT is not indicated. Patient and wife understanding and appreciative of exam.     History and Personal Factors relevant to plan of care:  bipolar  disorder, mild cognitive impairment, and Conversion disorder with seizures      Clinical Presentation  Stable    Clinical Presentation due to:  longstanding symptoms; no evidence of dizziness elicited     Clinical Decision Making  Low    Rehab Potential  -- NA eval only    PT Frequency  One time visit    PT Next Visit Plan  NA no follow-up PT    Recommended Other Services  return to neurologist for potential further testing    Consulted and Agree with Plan of Care  Patient;Family member/caregiver       Patient will benefit from skilled therapeutic intervention in order to improve the following deficits and impairments:   (NA)  Visit Diagnosis: Dizziness and giddiness - Plan: PT plan of care cert/re-cert     Problem List Patient Active Problem List   Diagnosis Date Noted  . Psychogenic nonepileptic seizure 02/18/2017  . Cholelithiasis 01/24/2017  . 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 Abnormal EKG 11/30/2015  . Elevated troponin 11/30/2015  . Bipolar affective disorder, most recent episode unspecified type, remission status unspecified 03/11/2015  . Bipolar affective disorder ([place]) 01/08/2015  . Generalized idiopathic epilepsy and epileptic syndromes, without status epilepticus, not intractable ([place]) 11/27/2014  . Seizure disorder ([place]) 10/14/2014    Rexanne Mano, PT 08/26/2017, 1:45 PM  [place] [address] [place], [place], [address] Phone: [phone]   Fax:  [phone]  Name: Franklin [name] MRN: [id number] Date of Birth: Sep 01, 1980

## 2017-08-30 ENCOUNTER — Inpatient Hospital Stay: Payer: Self-pay

## 2017-09-05 ENCOUNTER — Encounter: Payer: Self-pay | Admitting: Neurology

## 2017-09-05 ENCOUNTER — Telehealth: Payer: Self-pay | Admitting: Neurology

## 2017-09-05 NOTE — Telephone Encounter (Signed)
Pt called and said his therapist is suggesting a sooner appointment with Dr Delice Lesch, she has nothing before his already scheduled appointment in September, please advise

## 2017-09-05 NOTE — Telephone Encounter (Signed)
LMOM for Franklin [name] with [facility] asking for return call with therapist information.

## 2017-09-05 NOTE — Telephone Encounter (Signed)
Franklin [name], can you pls check on this, I just saw him 2 weeks ago, he does not need an earlier visit. I need to speak with his therapist, I had asked him to call us with her name/number so I can speak with her. Thanks

## 2017-09-05 NOTE — Telephone Encounter (Signed)
Dr Delice Lesch, I called and spoke with Pt. Questioned him if the therapist mentioned why he should be seen prior to scheduled appt, Pt did not know, nor does he know the therapist's name or where they are located, only that you referred him to a lady. OK to use an EMG reading slot, and if so, how soon would you like to see him (if you feel the need to see him before Sept.)? Thanks-Sandi

## 2017-09-19 ENCOUNTER — Emergency Department (HOSPITAL_COMMUNITY)
Admission: EM | Admit: 2017-09-19 | Discharge: 2017-09-19 | Disposition: A | Discharge status: Home / Self Care | Payer: Medicaid Other | Attending: Emergency Medicine | Admitting: Emergency Medicine

## 2017-09-19 ENCOUNTER — Encounter (HOSPITAL_COMMUNITY): Payer: Self-pay | Admitting: Emergency Medicine

## 2017-09-19 DIAGNOSIS — R079 Chest pain, unspecified: Secondary | ICD-10-CM | POA: Diagnosis present

## 2017-09-19 DIAGNOSIS — Z5321 Procedure and treatment not carried out due to patient leaving prior to being seen by health care provider: Secondary | ICD-10-CM | POA: Insufficient documentation

## 2017-09-19 DIAGNOSIS — R531 Weakness: Secondary | ICD-10-CM | POA: Insufficient documentation

## 2017-09-19 LAB — I-STAT TROPONIN, ED: Troponin i, poc: 0 ng/mL (ref 0.00–0.08)

## 2017-09-19 LAB — CBC
HCT: 42.9 % (ref 39.0–52.0)
Hemoglobin: 15.1 g/dL (ref 13.0–17.0)
MCH: 30.3 pg (ref 26.0–34.0)
MCHC: 35.2 g/dL (ref 30.0–36.0)
MCV: 86 fL (ref 78.0–100.0)
PLATELETS: 200 10*3/uL (ref 150–400)
RBC: 4.99 MIL/uL (ref 4.22–5.81)
RDW: 12.2 % (ref 11.5–15.5)
WBC: 7.4 10*3/uL (ref 4.0–10.5)

## 2017-09-19 LAB — BASIC METABOLIC PANEL
Anion gap: 10 (ref 5–15)
BUN: 10 mg/dL (ref 6–20)
CO2: 23 mmol/L (ref 22–32)
CREATININE: 0.95 mg/dL (ref 0.61–1.24)
Calcium: 9.6 mg/dL (ref 8.9–10.3)
Chloride: 105 mmol/L (ref 101–111)
GFR calc non Af Amer: >60 mL/min (ref >60)
Glucose, Bld: 65 mg/dL (ref 65–99)
Potassium: 3.5 mmol/L (ref 3.5–5.1)
SODIUM: 138 mmol/L (ref 135–145)

## 2017-09-19 NOTE — ED Notes (Signed)
No answer when called for xray

## 2017-09-19 NOTE — ED Triage Notes (Signed)
Pt reports chest pain starting an hour ago, pt family member reports that he has hx of chest pain before a seizure.  Pt's family member states he has not had a seizure today.  Denies n/v/d, slight generalized weakness and pt is not speaking which family members reports this often happens.  Pt is able to speak if questioned.

## 2017-09-21 NOTE — ED Notes (Signed)
09/21/2017, Attempted follow-up call, no answer.

## 2017-09-27 ENCOUNTER — Inpatient Hospital Stay: Payer: Self-pay | Admitting: Family Medicine

## 2017-09-30 ENCOUNTER — Emergency Department (HOSPITAL_COMMUNITY): Payer: Medicaid Other

## 2017-09-30 ENCOUNTER — Encounter (HOSPITAL_COMMUNITY): Payer: Self-pay | Admitting: Emergency Medicine

## 2017-09-30 ENCOUNTER — Emergency Department (HOSPITAL_COMMUNITY)
Admission: EM | Admit: 2017-09-30 | Discharge: 2017-10-01 | Disposition: A | Discharge status: Home / Self Care | Payer: Medicaid Other | Attending: Emergency Medicine | Admitting: Emergency Medicine

## 2017-09-30 DIAGNOSIS — Z5321 Procedure and treatment not carried out due to patient leaving prior to being seen by health care provider: Secondary | ICD-10-CM | POA: Insufficient documentation

## 2017-09-30 DIAGNOSIS — F410 Panic disorder [episodic paroxysmal anxiety] without agoraphobia: Secondary | ICD-10-CM | POA: Insufficient documentation

## 2017-09-30 LAB — BASIC METABOLIC PANEL
ANION GAP: 7 (ref 5–15)
BUN: 9 mg/dL (ref 6–20)
CHLORIDE: 103 mmol/L (ref 101–111)
CO2: 27 mmol/L (ref 22–32)
Calcium: 9.4 mg/dL (ref 8.9–10.3)
Creatinine, Ser: 0.93 mg/dL (ref 0.61–1.24)
GFR calc Af Amer: >60 mL/min (ref >60)
GLUCOSE: 88 mg/dL (ref 65–99)
POTASSIUM: 3.3 mmol/L — AB (ref 3.5–5.1)
SODIUM: 137 mmol/L (ref 135–145)

## 2017-09-30 LAB — CBC
HEMATOCRIT: 41.4 % (ref 39.0–52.0)
HEMOGLOBIN: 14.4 g/dL (ref 13.0–17.0)
MCH: 29.9 pg (ref 26.0–34.0)
MCHC: 34.8 g/dL (ref 30.0–36.0)
MCV: 86.1 fL (ref 78.0–100.0)
Platelets: 190 10*3/uL (ref 150–400)
RBC: 4.81 MIL/uL (ref 4.22–5.81)
RDW: 12.1 % (ref 11.5–15.5)
WBC: 6.1 10*3/uL (ref 4.0–10.5)

## 2017-09-30 NOTE — ED Notes (Signed)
Pt called for vitals reassessment, no response. Pt is not seen in the lobby at this time.

## 2017-09-30 NOTE — ED Triage Notes (Signed)
[facility], pt states he is having a "panic attack" He had onset of central CP non radiating while moving furniture out of his friends home. Pt under a lot of stress.

## 2017-10-01 NOTE — ED Notes (Signed)
Dark green tube for 2nd istat trop found behind the rocker in the mini lab.  Advised triage nurse and she will order trop I

## 2017-10-10 ENCOUNTER — Ambulatory Visit: Payer: Medicaid Other | Attending: Family Medicine | Admitting: Family Medicine

## 2017-10-10 ENCOUNTER — Encounter: Payer: Self-pay | Admitting: Family Medicine

## 2017-10-10 VITALS — BP 115/68 | HR 73 | Temp 98.5°F | Ht 72.0 in | Wt 163.4 lb

## 2017-10-10 DIAGNOSIS — R05 Cough: Secondary | ICD-10-CM | POA: Diagnosis not present

## 2017-10-10 DIAGNOSIS — Z885 Allergy status to narcotic agent status: Secondary | ICD-10-CM | POA: Diagnosis not present

## 2017-10-10 DIAGNOSIS — E876 Hypokalemia: Secondary | ICD-10-CM | POA: Diagnosis not present

## 2017-10-10 DIAGNOSIS — F3175 Bipolar disorder, in partial remission, most recent episode depressed: Secondary | ICD-10-CM

## 2017-10-10 DIAGNOSIS — J019 Acute sinusitis, unspecified: Secondary | ICD-10-CM | POA: Insufficient documentation

## 2017-10-10 DIAGNOSIS — Z79899 Other long term (current) drug therapy: Secondary | ICD-10-CM | POA: Insufficient documentation

## 2017-10-10 DIAGNOSIS — R569 Unspecified convulsions: Secondary | ICD-10-CM | POA: Insufficient documentation

## 2017-10-10 DIAGNOSIS — F319 Bipolar disorder, unspecified: Secondary | ICD-10-CM | POA: Insufficient documentation

## 2017-10-10 DIAGNOSIS — F445 Conversion disorder with seizures or convulsions: Secondary | ICD-10-CM

## 2017-10-10 DIAGNOSIS — R059 Cough, unspecified: Secondary | ICD-10-CM

## 2017-10-10 DIAGNOSIS — R0982 Postnasal drip: Secondary | ICD-10-CM | POA: Insufficient documentation

## 2017-10-10 DIAGNOSIS — J018 Other acute sinusitis: Secondary | ICD-10-CM | POA: Diagnosis not present

## 2017-10-10 DIAGNOSIS — Z888 Allergy status to other drugs, medicaments and biological substances status: Secondary | ICD-10-CM | POA: Insufficient documentation

## 2017-10-10 DIAGNOSIS — F209 Schizophrenia, unspecified: Secondary | ICD-10-CM | POA: Diagnosis not present

## 2017-10-10 DIAGNOSIS — G3184 Mild cognitive impairment, so stated: Secondary | ICD-10-CM | POA: Diagnosis not present

## 2017-10-10 DIAGNOSIS — Z880 Allergy status to penicillin: Secondary | ICD-10-CM | POA: Insufficient documentation

## 2017-10-10 MED ORDER — CETIRIZINE HCL 10 MG PO TABS: 10.0000 mg | ORAL_TABLET | Freq: Every day | ORAL | 1 refills | Status: DC

## 2017-10-10 MED ORDER — FLUTICASONE PROPIONATE 50 MCG/ACT NA SUSP: 2.0000 | Freq: Every day | NASAL | 1 refills | Status: DC

## 2017-10-10 MED ORDER — BENZONATATE 100 MG PO CAPS: 100.0000 mg | ORAL_CAPSULE | Freq: Two times a day (BID) | ORAL | 1 refills | Status: DC | PRN

## 2017-10-10 NOTE — Progress Notes (Signed)
Subjective:  Patient ID: Franklin [name], male    DOB: 07-28-1980  Age: 37 y.o. MRN: [id number]  CC: Cough   HPI Franklin [name]  is a 37 year old male with a history of psychogenic nonepileptic spells, bipolar disorder, mild cognitive impairment who presents today with a 1 day history of "a bad cough", postnasal drip and mucus production from his nostrils.  He denies sinus tenderness, fever but endorses some dyspnea and dizziness which is not related to position. He has used TheraFlu with no improvement in symptoms. Bipolar disorder is managed by mental health and he endorses compliance with Trileptal.  He also sees neurology, Dr. Delice Lesch for management of his psychogenic nonepileptic spells and has had no recent seizures. Review of his labs indicate his most recent potassium was 3.3 on 09/30/2017.  Past Medical History:  Diagnosis Date  . Bipolar 1 disorder ([place])   . Chronic headaches   . Conversion disorder with seizures or convulsions   . 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 Depression   . Hx of electroencephalogram 12/2014   normal  . Mild cognitive impairment   . Psychogenic nonepileptic seizure   . Schizophrenic disorder ([place])   . Seizures ([place])     History reviewed. No pertinent surgical history.  Allergies  Allergen Reactions  . Coconut Oil Anaphylaxis and Nausea And Vomiting    Reaction to any kind of coconut  . Other Anaphylaxis    Raisins  . Codeine Nausea And Vomiting  . Depakote [Divalproex Sodium] Other (See Comments)    Pt states that this medication makes him feel like he is out of it.   Marland Kitchen Penicillins Nausea And Vomiting    Has patient had a PCN reaction causing immediate rash, facial/tongue/throat swelling, SOB or lightheadedness with hypotension: No Has patient had a PCN reaction causing severe rash involving mucus membranes or skin necrosis: No Has patient had a PCN reaction that  required hospitalization No Has patient had a PCN reaction occurring within the last 10 years: No If all of the above answers are "NO", then may proceed with Cephalosporin use.    . Tape Other (See Comments)    Reaction:  Tears skin, Pt is able to use paper tape.       Outpatient Medications Prior to Visit  Medication Sig Dispense Refill  . Oxcarbazepine (TRILEPTAL) 300 MG tablet Take 1/2 tablet twice a day 30 tablet 11  . cetirizine (ZYRTEC) 10 MG tablet Take 1 tablet (10 mg total) by mouth daily. 30 tablet 1  . omeprazole (PRILOSEC) 40 MG capsule Take 1 capsule (40 mg total) by mouth daily. (Patient not taking: Reported on 10/10/2017) 30 capsule 6  . oseltamivir (TAMIFLU) 75 MG capsule Take 1 capsule (75 mg total) by mouth 2 (two) times daily. (Patient not taking: Reported on 10/10/2017) 10 capsule 0   No facility-administered medications prior to visit.     ROS Review of Systems  Constitutional: Negative for activity change and appetite change.  HENT: Positive for postnasal drip. Negative for sinus pressure and sore throat.   Eyes: Negative for visual disturbance.  Respiratory: Positive for cough. Negative for chest tightness and shortness of breath.   Cardiovascular: Negative for chest pain and leg swelling.  Gastrointestinal: Negative for abdominal distention, abdominal pain, constipation and diarrhea.  Endocrine: Negative.   Genitourinary: Negative for dysuria.  Musculoskeletal: Negative for joint swelling and myalgias.  Skin: Negative for rash.  Allergic/Immunologic: Negative.   Neurological: Negative for weakness, light-headedness and numbness.  Psychiatric/Behavioral: Negative for dysphoric mood and suicidal ideas.    Objective:  BP 115/68   Pulse 73   Temp 98.5 F (36.9 C) (Oral)   Ht 6' (1.829 m)   Wt 163 lb 6.4 oz (74.1 kg)   SpO2 100%   BMI 22.16 kg/m   BP/Weight 10/10/2017 09/30/2017 9/70/2637  Systolic BP 858 850 277  Diastolic BP 68 80 88  Wt. (Lbs) 163.4  155 -  BMI 22.16 21.02 -      Physical Exam  Constitutional: He is oriented to person, place, and time. He appears well-developed and well-nourished.  HENT:  Right Ear: External ear normal.  Left Ear: External ear normal.  Postnasal drip evident in oropharynx  Cardiovascular: Normal rate, normal heart sounds and intact distal pulses.  No murmur heard. Pulmonary/Chest: Effort normal and breath sounds normal. He has no wheezes. He has no rales. He exhibits no tenderness.  Abdominal: Soft. Bowel sounds are normal. He exhibits no distension and no mass. There is no tenderness.  Musculoskeletal: Normal range of motion.  Neurological: He is alert and oriented to person, place, and time.  Skin: Skin is warm and dry.  Psychiatric: He has a normal mood and affect.     Assessment & Plan:   1. Acute non-recurrent sinusitis of other sinus Symptoms have been on for 1 day No indication for antibiotic at this time - fluticasone (FLONASE) 50 MCG/ACT nasal spray; Place 2 sprays into both nostrils daily.  Dispense: 16 g; Refill: 1  2. Cough Likely from postnasal drip - benzonatate (TESSALON) 100 MG capsule; Take 1 capsule (100 mg total) by mouth 2 (two) times daily as needed for cough.  Dispense: 20 capsule; Refill: 1 - cetirizine (ZYRTEC) 10 MG tablet; Take 1 tablet (10 mg total) by mouth daily.  Dispense: 30 tablet; Refill: 1  3. Hypokalemia - Basic Metabolic Panel  4. Psychogenic nonepileptic seizure Currently on Trileptal Keep appointment with neurology  5. Bipolar disorder, in partial remission, most recent episode depressed ([place]) Currently on Trileptal   Meds ordered this encounter  Medications  . fluticasone (FLONASE) 50 MCG/ACT nasal spray    Sig: Place 2 sprays into both nostrils daily.    Dispense:  16 g    Refill:  1  . benzonatate (TESSALON) 100 MG capsule    Sig: Take 1 capsule (100 mg total) by mouth 2 (two) times daily as needed for cough.    Dispense:  20 capsule      Refill:  1  . cetirizine (ZYRTEC) 10 MG tablet    Sig: Take 1 tablet (10 mg total) by mouth daily.    Dispense:  30 tablet    Refill:  1    Follow-up: Return for Follow-up of chronic medical conditions, keep previously scheduled appointment.   Charlott Rakes MD

## 2017-10-10 NOTE — Patient Instructions (Signed)

## 2017-10-12 ENCOUNTER — Other Ambulatory Visit: Payer: Self-pay

## 2017-10-12 ENCOUNTER — Emergency Department (HOSPITAL_COMMUNITY)
Admission: EM | Admit: 2017-10-12 | Discharge: 2017-10-13 | Disposition: A | Discharge status: Home / Self Care | Payer: Medicaid Other | Attending: Emergency Medicine | Admitting: Emergency Medicine

## 2017-10-12 ENCOUNTER — Encounter (HOSPITAL_COMMUNITY): Payer: Self-pay | Admitting: Emergency Medicine

## 2017-10-12 DIAGNOSIS — R1110 Vomiting, unspecified: Secondary | ICD-10-CM | POA: Diagnosis present

## 2017-10-12 DIAGNOSIS — Z5321 Procedure and treatment not carried out due to patient leaving prior to being seen by health care provider: Secondary | ICD-10-CM | POA: Insufficient documentation

## 2017-10-12 DIAGNOSIS — M7918 Myalgia, other site: Secondary | ICD-10-CM | POA: Diagnosis not present

## 2017-10-12 LAB — COMPREHENSIVE METABOLIC PANEL
ALBUMIN: 4.3 g/dL (ref 3.5–5.0)
ALK PHOS: 86 U/L (ref 38–126)
ALT: 52 U/L (ref 17–63)
ANION GAP: 10 (ref 5–15)
AST: 32 U/L (ref 15–41)
BILIRUBIN TOTAL: 1.8 mg/dL — AB (ref 0.3–1.2)
BUN: 10 mg/dL (ref 6–20)
CALCIUM: 9.2 mg/dL (ref 8.9–10.3)
CO2: 24 mmol/L (ref 22–32)
CREATININE: 0.9 mg/dL (ref 0.61–1.24)
Chloride: 107 mmol/L (ref 101–111)
GFR calc non Af Amer: >60 mL/min (ref >60)
GLUCOSE: 99 mg/dL (ref 65–99)
Potassium: 3.5 mmol/L (ref 3.5–5.1)
SODIUM: 141 mmol/L (ref 135–145)
TOTAL PROTEIN: 7.9 g/dL (ref 6.5–8.1)

## 2017-10-12 LAB — CBC
HCT: 41.6 % (ref 39.0–52.0)
HEMOGLOBIN: 14.4 g/dL (ref 13.0–17.0)
MCH: 30 pg (ref 26.0–34.0)
MCHC: 34.6 g/dL (ref 30.0–36.0)
MCV: 86.7 fL (ref 78.0–100.0)
PLATELETS: 193 10*3/uL (ref 150–400)
RBC: 4.8 MIL/uL (ref 4.22–5.81)
RDW: 12 % (ref 11.5–15.5)
WBC: 7.9 10*3/uL (ref 4.0–10.5)

## 2017-10-12 LAB — LIPASE, BLOOD: Lipase: 30 U/L (ref 11–51)

## 2017-10-12 MED ORDER — ONDANSETRON 4 MG PO TBDP: 4.0000 mg | ORAL_TABLET | Freq: Once | ORAL | Status: DC | PRN

## 2017-10-12 NOTE — ED Triage Notes (Signed)
Per [facility] Patient stated he vomited blood before they got there. Patient is also complaining of cough, abdominal pain, and generalized body aches.

## 2017-10-12 NOTE — ED Notes (Signed)
Pt called to be roomed with no response, rn notified

## 2017-10-13 ENCOUNTER — Ambulatory Visit: Payer: Self-pay | Admitting: Neurology

## 2017-10-15 ENCOUNTER — Emergency Department (HOSPITAL_COMMUNITY): Payer: Medicaid Other

## 2017-10-15 ENCOUNTER — Emergency Department (HOSPITAL_COMMUNITY)
Admission: EM | Admit: 2017-10-15 | Discharge: 2017-10-15 | Disposition: A | Discharge status: Home / Self Care | Payer: Medicaid Other | Attending: Emergency Medicine | Admitting: Emergency Medicine

## 2017-10-15 DIAGNOSIS — R05 Cough: Secondary | ICD-10-CM | POA: Diagnosis not present

## 2017-10-15 DIAGNOSIS — R569 Unspecified convulsions: Secondary | ICD-10-CM

## 2017-10-15 DIAGNOSIS — R059 Cough, unspecified: Secondary | ICD-10-CM

## 2017-10-15 DIAGNOSIS — Z79899 Other long term (current) drug therapy: Secondary | ICD-10-CM | POA: Insufficient documentation

## 2017-10-15 LAB — COMPREHENSIVE METABOLIC PANEL
ALK PHOS: 83 U/L (ref 38–126)
ALT: 38 U/L (ref 17–63)
AST: 22 U/L (ref 15–41)
Albumin: 3.8 g/dL (ref 3.5–5.0)
Anion gap: 8 (ref 5–15)
BILIRUBIN TOTAL: 1.1 mg/dL (ref 0.3–1.2)
BUN: 9 mg/dL (ref 6–20)
CALCIUM: 9.2 mg/dL (ref 8.9–10.3)
CO2: 25 mmol/L (ref 22–32)
CREATININE: 0.87 mg/dL (ref 0.61–1.24)
Chloride: 106 mmol/L (ref 101–111)
GFR calc Af Amer: >60 mL/min (ref >60)
Glucose, Bld: 84 mg/dL (ref 65–99)
Potassium: 3.7 mmol/L (ref 3.5–5.1)
Sodium: 139 mmol/L (ref 135–145)
TOTAL PROTEIN: 7.4 g/dL (ref 6.5–8.1)

## 2017-10-15 LAB — CBC WITH DIFFERENTIAL/PLATELET
Basophils Absolute: 0 10*3/uL (ref 0.0–0.1)
Basophils Relative: 1 %
EOS ABS: 0.1 10*3/uL (ref 0.0–0.7)
EOS PCT: 2 %
HEMATOCRIT: 38.8 % — AB (ref 39.0–52.0)
Hemoglobin: 13.6 g/dL (ref 13.0–17.0)
Lymphocytes Relative: 31 %
Lymphs Abs: 1.4 10*3/uL (ref 0.7–4.0)
MCH: 30.4 pg (ref 26.0–34.0)
MCHC: 35.1 g/dL (ref 30.0–36.0)
MCV: 86.8 fL (ref 78.0–100.0)
MONO ABS: 0.4 10*3/uL (ref 0.1–1.0)
MONOS PCT: 9 %
NEUTROS ABS: 2.5 10*3/uL (ref 1.7–7.7)
Neutrophils Relative %: 57 %
Platelets: 183 10*3/uL (ref 150–400)
RBC: 4.47 MIL/uL (ref 4.22–5.81)
RDW: 11.9 % (ref 11.5–15.5)
WBC: 4.4 10*3/uL (ref 4.0–10.5)

## 2017-10-15 LAB — URINALYSIS, ROUTINE W REFLEX MICROSCOPIC
Bilirubin Urine: NEGATIVE
GLUCOSE, UA: NEGATIVE mg/dL
HGB URINE DIPSTICK: NEGATIVE
Ketones, ur: NEGATIVE mg/dL
Leukocytes, UA: NEGATIVE
Nitrite: NEGATIVE
PH: 9 — AB (ref 5.0–8.0)
PROTEIN: NEGATIVE mg/dL
Specific Gravity, Urine: 1.006 (ref 1.005–1.030)

## 2017-10-15 LAB — ETHANOL

## 2017-10-15 LAB — LIPASE, BLOOD: Lipase: 27 U/L (ref 11–51)

## 2017-10-15 MED ORDER — CHLORPHENIRAMINE-PHENYLEPHRINE 1-3.5 MG/ML PO LIQD: 0.7500 mL | Freq: Four times a day (QID) | ORAL | 0 refills | Status: DC | PRN

## 2017-10-15 NOTE — ED Provider Notes (Signed)
[facility] DEPT Provider Note   CSN: [id number] Arrival date & time: 10/15/17  1130     History   Chief Complaint No chief complaint on file.   HPI Franklin [name] is a 37 y.o. male.  HPI  Patient presents after seizure. He is here with his male companion who witnessed the seizure. He has multiple medical issues, and has recent URI-like illness for which she is taking Tessalon, cetirizine, inhaled steroid. Beyond those medications, no other new medicine. Today, the patient stated he was feeling unwell, according to his companion got very agitated, anxious, and subsequently had a seizure. Currently the patient states that he feels about the same as usual, with some discomfort with coughing, but no dyspnea, no fever, no weakness in an extremity or other substantial changes from baseline. He was seen and evaluated within the past week for his cough, by primary care.  Past Medical History:  Diagnosis Date  . Bipolar 1 disorder ([place])   . Chronic headaches   . Conversion disorder with seizures or convulsions   . 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 Depression   . Hx of electroencephalogram 12/2014   normal  . Mild cognitive impairment   . Psychogenic nonepileptic seizure   . Schizophrenic disorder ([place])   . Seizures [facility])     Patient Active Problem List   Diagnosis Date Noted  . Psychogenic nonepileptic seizure 02/18/2017  . Cholelithiasis 01/24/2017  . 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 Abnormal EKG 11/30/2015  . Elevated troponin 11/30/2015  . Bipolar affective disorder, most recent episode unspecified type, remission status unspecified 03/11/2015  . Bipolar affective disorder ([place]) 01/08/2015  . Generalized idiopathic epilepsy and epileptic syndromes, without status epilepticus, not intractable ([place]) 11/27/2014  . Seizure  disorder ([place]) 10/14/2014    No past surgical history on file.      Home Medications    Prior to Admission medications   Medication Sig Start Date End Date Taking? Authorizing Provider  benzonatate (TESSALON) 100 MG capsule Take 1 capsule (100 mg total) by mouth 2 (two) times daily as needed for cough. 10/10/17  Yes Charlott Rakes, MD  cetirizine (ZYRTEC) 10 MG tablet Take 1 tablet (10 mg total) by mouth daily. 10/10/17  Yes Newlin, Charlane Ferretti, MD  fluticasone (FLONASE) 50 MCG/ACT nasal spray Place 2 sprays into both nostrils daily. 10/10/17  Yes Charlott Rakes, MD  Oxcarbazepine (TRILEPTAL) 300 MG tablet Take 1/2 tablet twice a day Patient taking differently: Take 150-300 mg by mouth 2 (two) times daily. 150 mg every AM and 300 mg every evening 08/23/17  Yes Cameron Sprang, MD  omeprazole (PRILOSEC) 40 MG capsule Take 1 capsule (40 mg total) by mouth daily. Patient not taking: Reported on 10/10/2017 08/23/17   Cameron Sprang, MD  oseltamivir (TAMIFLU) 75 MG capsule Take 1 capsule (75 mg total) by mouth 2 (two) times daily. Patient not taking: Reported on 10/10/2017 06/28/17   Charlott Rakes, MD    Family History Family History  Problem Relation Age of Onset  . Heart disease Mother     Social History Social History   Tobacco Use  . Smoking status: Never Smoker  . Smokeless tobacco: Never Used  Substance Use Topics  . Alcohol use: Yes    Alcohol/week: 0.0 oz    Comment: 40oz today  . Drug use: No     Allergies   Coconut  oil; Other; Codeine; Depakote [divalproex sodium]; Penicillins; and Tape   Review of Systems Review of Systems  Constitutional:       Per HPI, otherwise negative  HENT:       Per HPI, otherwise negative  Respiratory:       Per HPI, otherwise negative  Cardiovascular:       Per HPI, otherwise negative  Gastrointestinal: Negative for vomiting.  Endocrine:       Negative aside from HPI  Genitourinary:       Neg aside from HPI   Musculoskeletal:        Per HPI, otherwise negative  Skin: Negative.   Neurological: Positive for seizures.  Psychiatric/Behavioral: The patient is nervous/anxious.      Physical Exam Updated Vital Signs BP 122/72   Pulse (!) 48   Temp 98.2 F (36.8 C) (Oral)   Resp 10   SpO2 100%   Physical Exam  Constitutional: He is oriented to person, place, and time. He appears well-developed. No distress.  HENT:  Head: Normocephalic and atraumatic.  Eyes: Conjunctivae and EOM are normal.  Cardiovascular: Normal rate and regular rhythm.  Pulmonary/Chest: Effort normal. No stridor. No respiratory distress.  Abdominal: He exhibits no distension.  Musculoskeletal: He exhibits no edema.  Neurological: He is alert and oriented to person, place, and time.  Skin: Skin is warm and dry.  Psychiatric: He has a normal mood and affect.  Nursing note and vitals reviewed.    ED Treatments / Results  Labs (all labs ordered are listed, but only abnormal results are displayed) Labs Reviewed  CBC WITH DIFFERENTIAL/PLATELET - Abnormal; Notable for the following components:      Result Value   HCT 38.8 (*)    All other components within normal limits  URINALYSIS, ROUTINE W REFLEX MICROSCOPIC - Abnormal; Notable for the following components:   Color, Urine STRAW (*)    pH 9.0 (*)    All other components within normal limits  COMPREHENSIVE METABOLIC PANEL  ETHANOL  LIPASE, BLOOD    Radiology Dg Chest 2 View  Result Date: 10/15/2017 CLINICAL DATA:  37 year old male with a history of seizure and productive cough EXAM: CHEST - 2 VIEW COMPARISON:  09/30/2017 FINDINGS: Cardiomediastinal silhouette unchanged in size and contour. No evidence of central vascular congestion. No pneumothorax or pleural effusion. No confluent airspace disease. No displaced fracture similar appearance of coarsened interstitial markings. IMPRESSION: Negative for acute cardiopulmonary disease Electronically Signed   By: Corrie Mckusick D.O.   On:  10/15/2017 12:43    Procedures Procedures (including critical care time)  Medications Ordered in ED Medications - No data to display   Initial Impression / Assessment and Plan / ED Course  I have reviewed the triage vital signs and the nursing notes.  Pertinent labs & imaging results that were available during my care of the patient were reviewed by me and considered in my medical decision making (see chart for details).     1:22 PM On repeat exam the patient is in no distress, sitting upright, no additional seizure activity.  When he remains hemodynamically unremarkable. Labs reassuring, x-ray reassuring, no evidence for pneumonia, substantial electrolyte abnormalities or other acute new findings. Patient's story is reassuring, though he had a seizure, he has a history of this, and with recent addition to medication, he may have had lowering of his seizure threshold. With otherwise reassuring findings, physical exam, vitals, the patient was discharged in stable condition with outpatient follow-up Patient will start  a new course of medication for his cough.   Final Clinical Impressions(s) / ED Diagnoses  Seizure Cough   Carmin Muskrat, MD 10/15/17 1323

## 2017-10-15 NOTE — ED Notes (Signed)
Patient aware we need urine sample. Patient unable to void at this time. Urinal at bedside. Patient will call out when he provides a sample.

## 2017-10-15 NOTE — ED Notes (Signed)
Bed: WA09 Expected date:  Expected time:  Means of arrival:  Comments: 37 yo seizure

## 2017-10-15 NOTE — ED Triage Notes (Signed)
Per EMS, patient from home, c/o cold like sx x3 days. While EMS was assessing patient, patient anxiety increased causing patient to have panic attack and then seizure lasting approximately three minutes. Post ictal with EMS. A&Ox4.  18g L AC

## 2017-10-15 NOTE — Discharge Instructions (Signed)
As discussed, your evaluation today has been largely reassuring.  But, it is important that you monitor your condition carefully, and do not hesitate to return to the ED if you develop new, or concerning changes in your condition. ? ?Otherwise, please follow-up with your physician for appropriate ongoing care. ? ?

## 2017-10-24 ENCOUNTER — Emergency Department (HOSPITAL_COMMUNITY)
Admission: EM | Admit: 2017-10-24 | Discharge: 2017-10-24 | Disposition: A | Discharge status: Home / Self Care | Payer: Medicaid Other | Attending: Emergency Medicine | Admitting: Emergency Medicine

## 2017-10-24 ENCOUNTER — Encounter (HOSPITAL_COMMUNITY): Payer: Self-pay | Admitting: *Deleted

## 2017-10-24 ENCOUNTER — Other Ambulatory Visit: Payer: Self-pay

## 2017-10-24 DIAGNOSIS — Z5321 Procedure and treatment not carried out due to patient leaving prior to being seen by health care provider: Secondary | ICD-10-CM | POA: Diagnosis not present

## 2017-10-24 DIAGNOSIS — R109 Unspecified abdominal pain: Secondary | ICD-10-CM | POA: Diagnosis present

## 2017-10-24 NOTE — ED Notes (Signed)
Pt called for vital recheck with no response x1

## 2017-10-24 NOTE — ED Notes (Signed)
Pt called from the lobby with no response x2 

## 2017-10-24 NOTE — ED Triage Notes (Signed)
EMS states he ate a hot dog then started to have abd pain, nonspecific. Pt does not think it is related to the hot dog

## 2017-10-25 NOTE — ED Notes (Signed)
Follow up call made  No answer   1245  05/28 19  s Deidre Carino rn

## 2017-10-28 ENCOUNTER — Telehealth: Payer: Self-pay | Admitting: Family Medicine

## 2017-10-28 NOTE — Telephone Encounter (Signed)
Call placed to [facility] [phone], regarding PCS ICD -10 form. Was informed that they needed form to be filled out (especially ICD codes) and signed by provider. Asked if it was for more PCS hours and she stated "no".   Called placed to [facility] [phone], and spoke with [facility]. She stated that a patient's referral was received back in 02/2017 and patient has active PCS services. She didn't understand why agency is requesting form especially if they are not requesting for more hours.

## 2017-10-31 ENCOUNTER — Emergency Department (HOSPITAL_COMMUNITY)
Admission: EM | Admit: 2017-10-31 | Discharge: 2017-10-31 | Disposition: A | Discharge status: Home / Self Care | Payer: Medicaid Other | Attending: Emergency Medicine | Admitting: Emergency Medicine

## 2017-10-31 ENCOUNTER — Other Ambulatory Visit: Payer: Self-pay

## 2017-10-31 ENCOUNTER — Encounter (HOSPITAL_COMMUNITY): Payer: Self-pay

## 2017-10-31 DIAGNOSIS — R569 Unspecified convulsions: Secondary | ICD-10-CM

## 2017-10-31 DIAGNOSIS — Z79899 Other long term (current) drug therapy: Secondary | ICD-10-CM | POA: Diagnosis not present

## 2017-10-31 MED ORDER — IBUPROFEN 200 MG PO TABS
600.0000 mg | ORAL_TABLET | Freq: Once | ORAL | Status: AC
  Administered 2017-10-31: 600 mg via ORAL
  Filled 2017-10-31: qty 3

## 2017-10-31 NOTE — Discharge Instructions (Addendum)
You were evaluated in the emergency department for a seizure.  You will need to continue your medications as prescribed.  Please follow-up with your doctor and return if any worsening symptoms.
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
## 2017-10-31 NOTE — ED Triage Notes (Signed)
Pt presents via EMS with c/o seizures. Pt has a hx of same. Pt reports a seizure, unwitnessed. Pt is now alert and oriented. Pt denies any neck or back pain.

## 2017-11-01 ENCOUNTER — Other Ambulatory Visit: Payer: Self-pay

## 2017-11-01 ENCOUNTER — Encounter (HOSPITAL_COMMUNITY): Payer: Self-pay | Admitting: Emergency Medicine

## 2017-11-01 ENCOUNTER — Emergency Department (HOSPITAL_COMMUNITY)
Admission: EM | Admit: 2017-11-01 | Discharge: 2017-11-01 | Disposition: A | Discharge status: Home / Self Care | Payer: Medicaid Other | Attending: Emergency Medicine | Admitting: Emergency Medicine

## 2017-11-01 DIAGNOSIS — Z79899 Other long term (current) drug therapy: Secondary | ICD-10-CM | POA: Insufficient documentation

## 2017-11-01 DIAGNOSIS — E860 Dehydration: Secondary | ICD-10-CM | POA: Diagnosis not present

## 2017-11-01 DIAGNOSIS — R569 Unspecified convulsions: Secondary | ICD-10-CM | POA: Insufficient documentation

## 2017-11-01 DIAGNOSIS — Z733 Stress, not elsewhere classified: Secondary | ICD-10-CM | POA: Insufficient documentation

## 2017-11-01 MED ORDER — OXCARBAZEPINE 150 MG PO TABS
150.0000 mg | ORAL_TABLET | Freq: Once | ORAL | Status: AC
  Administered 2017-11-01: 150 mg via ORAL
  Filled 2017-11-01: qty 1

## 2017-11-01 NOTE — ED Triage Notes (Signed)
Pt brought in by EMS from home with c/o seizure like activity  Pt was seen on Monday for same  Family witnessed the activity and EMS states he had two episodes with them  Pt eyes open but pt has been nonverbal

## 2017-11-01 NOTE — Discharge Instructions (Addendum)
Please take your medication as directed by Dr. Delice Lesch (oxcarbazapine 150 mg twice a day).    Get help right away if: You injure yourself during a seizure. You have one seizure after another. You have trouble recovering from a seizure. You have chest pain or trouble breathing. You have a seizure that lasts longer than 5 minutes.

## 2017-11-01 NOTE — ED Provider Notes (Signed)
[place] DEPT Provider Note   CSN: [id number] Arrival date & time: 11/01/17  0137     History   Chief Complaint Chief Complaint  Patient presents with  . Seizures    HPI Franklin [name] is a 37 y.o. male with a past medical history of psychogenic seizures, bipolar bipolar disorder, and schizophrenia.  Patient returned to the emergency department for seizure-like activity witnessed by his wife this morning.  She states that he has been off of his oxcarbazepine 150 mg twice daily for the past week.  He denies fevers or chills.  The patient is returned to baseline.  He denies biting his tongue or incontinence of his bowel or bladder.  He complains of mild tenderness to the back of the head but denies severe headache, changes in vision, unilateral weakness, photophobia, phonophobia or other neurologic complaints.  He denies alcohol or drug abuse.  HPI  Past Medical History:  Diagnosis Date  . Bipolar 1 disorder ([place])   . Chronic headaches   . Conversion disorder with seizures or convulsions   . 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 Depression   . Hx of electroencephalogram 12/2014   normal  . Mild cognitive impairment   . Psychogenic nonepileptic seizure   . Schizophrenic disorder ([place])   . Seizures [facility])     Patient Active Problem List   Diagnosis Date Noted  . Psychogenic nonepileptic seizure 02/18/2017  . Cholelithiasis 01/24/2017  . 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 Abnormal EKG 11/30/2015  . Elevated troponin 11/30/2015  . Bipolar affective disorder, most recent episode unspecified type, remission status unspecified 03/11/2015  . Bipolar affective disorder ([place]) 01/08/2015  . Generalized idiopathic epilepsy and epileptic syndromes, without status epilepticus, not intractable ([place]) 11/27/2014  . Seizure disorder ([place]) 10/14/2014     History reviewed. No pertinent surgical history.      Home Medications    Prior to Admission medications   Medication Sig Start Date End Date Taking? Authorizing Provider  acetaminophen (TYLENOL) 500 MG tablet Take 1,000 mg by mouth every 6 (six) hours as needed for moderate pain.   Yes [provider]  omeprazole (PRILOSEC) 40 MG capsule Take 1 capsule (40 mg total) by mouth daily. 08/23/17  Yes Cameron Sprang, MD  Oxcarbazepine (TRILEPTAL) 300 MG tablet Take 1/2 tablet twice a day Patient taking differently: Take 150 mg by mouth 2 (two) times daily.  08/23/17  Yes Cameron Sprang, MD  chlorpheniramine-phenylephrine (CARDEC) 1-3.5 MG/ML LIQD Take 0.75 mLs by mouth 4 (four) times daily as needed. Patient not taking: Reported on 11/01/2017 10/15/17   Carmin Muskrat, MD  oseltamivir (TAMIFLU) 75 MG capsule Take 1 capsule (75 mg total) by mouth 2 (two) times daily. Patient not taking: Reported on 10/31/2017 06/28/17   Charlott Rakes, MD    Family History Family History  Problem Relation Age of Onset  . Heart disease Mother     Social History Social History   Tobacco Use  . Smoking status: Never Smoker  . Smokeless tobacco: Never Used  Substance Use Topics  . Alcohol use: Yes    Alcohol/week: 0.0 oz    Comment: 40oz today  . Drug use: No     Allergies   Coconut oil; Other; Codeine; Depakote [divalproex sodium]; Penicillins; and Tape   Review of Systems Review of Systems  .Ten systems reviewed and are negative for acute  change, except as noted in the HPI.   Physical Exam Updated Vital Signs BP 106/60   Pulse 69   Temp 97.6 F (36.4 C) (Axillary)   Resp 18   SpO2 96%   Physical Exam  Constitutional: He is oriented to person, place, and time. He appears well-developed and well-nourished. No distress.  HENT:  Head: Normocephalic and atraumatic.  Eyes: Conjunctivae are normal. No scleral icterus.  Neck: Normal range of motion. Neck supple.   Cardiovascular: Normal rate, regular rhythm and normal heart sounds.  Pulmonary/Chest: Effort normal and breath sounds normal. No respiratory distress.  Abdominal: Soft. There is no tenderness.  Musculoskeletal: He exhibits no edema.  Neurological: He is alert and oriented to person, place, and time. He displays normal reflexes. No cranial nerve deficit or sensory deficit. He exhibits normal muscle tone. Coordination normal.  Skin: Skin is warm and dry. He is not diaphoretic.  Psychiatric: His behavior is normal.  Nursing note and vitals reviewed.    ED Treatments / Results  Labs (all labs ordered are listed, but only abnormal results are displayed) Labs Reviewed - No data to display  EKG None    Radiology No results found.  Procedures Procedures (including critical care time)  Medications Ordered in ED Medications  OXcarbazepine (TRILEPTAL) tablet 150 mg (has no administration in time range)   Results from visit within the last 24 hours  Results for orders placed or performed during the hospital encounter of 10/15/17  Comprehensive metabolic panel  Result Value Ref Range   Sodium 139 135 - 145 mmol/L   Potassium 3.7 3.5 - 5.1 mmol/L   Chloride 106 101 - 111 mmol/L   CO2 25 22 - 32 mmol/L   Glucose, Bld 84 65 - 99 mg/dL   BUN 9 6 - 20 mg/dL   Creatinine, Ser 0.87 0.61 - 1.24 mg/dL   Calcium 9.2 8.9 - 10.3 mg/dL   Total Protein 7.4 6.5 - 8.1 g/dL   Albumin 3.8 3.5 - 5.0 g/dL   AST 22 15 - 41 U/L   ALT 38 17 - 63 U/L   Alkaline Phosphatase 83 38 - 126 U/L   Total Bilirubin 1.1 0.3 - 1.2 mg/dL   GFR calc non Af Amer >60 >60 mL/min   GFR calc Af Amer >60 >60 mL/min   Anion gap 8 5 - 15  Ethanol  Result Value Ref Range   Alcohol, Ethyl (B) <10 <10 mg/dL  Lipase, blood  Result Value Ref Range   Lipase 27 11 - 51 U/L  CBC with Differential  Result Value Ref Range   WBC 4.4 4.0 - 10.5 K/uL   RBC 4.47 4.22 - 5.81 MIL/uL   Hemoglobin 13.6 13.0 - 17.0 g/dL   HCT  38.8 (L) 39.0 - 52.0 %   MCV 86.8 78.0 - 100.0 fL   MCH 30.4 26.0 - 34.0 pg   MCHC 35.1 30.0 - 36.0 g/dL   RDW 11.9 11.5 - 15.5 %   Platelets 183 150 - 400 K/uL   Neutrophils Relative % 57 %   Neutro Abs 2.5 1.7 - 7.7 K/uL   Lymphocytes Relative 31 %   Lymphs Abs 1.4 0.7 - 4.0 K/uL   Monocytes Relative 9 %   Monocytes Absolute 0.4 0.1 - 1.0 K/uL   Eosinophils Relative 2 %   Eosinophils Absolute 0.1 0.0 - 0.7 K/uL   Basophils Relative 1 %   Basophils Absolute 0.0 0.0 - 0.1 K/uL  Urinalysis, Routine w  reflex microscopic  Result Value Ref Range   Color, Urine STRAW (A) YELLOW   APPearance CLEAR CLEAR   Specific Gravity, Urine 1.006 1.005 - 1.030   pH 9.0 (H) 5.0 - 8.0   Glucose, UA NEGATIVE NEGATIVE mg/dL   Hgb urine dipstick NEGATIVE NEGATIVE   Bilirubin Urine NEGATIVE NEGATIVE   Ketones, ur NEGATIVE NEGATIVE mg/dL   Protein, ur NEGATIVE NEGATIVE mg/dL   Nitrite NEGATIVE NEGATIVE   Leukocytes, UA NEGATIVE NEGATIVE       Initial Impression / Assessment and Plan / ED Course  I have reviewed the triage vital signs and the nursing notes.  Pertinent labs & imaging results that were available during my care of the patient were reviewed by me and considered in my medical decision making (see chart for details).     Patient with evidence of mild dehydration on his lab values done within the last 24 hours.  He has been under increased stress per his wife which may be contributing to the increase in his pseudoseizures. I discussed medication dosages with the pharmacist and there is no recommended loading dose for oxcarbazepine.  Patient given his normal 150 mg dose this morning.  Patient appears appropriate for discharge at this time.  He may follow closely with Dr. Delice Lesch his neurologist.  He has noted no repeat episodes of seizure-like activity here in the emergency department. Patient Discussed with attending physician. Who agrees with assessment, work up , treatment, and plan for  discharge.    Final Clinical Impressions(s) / ED Diagnoses   Final diagnoses:  Seizure-like activity [facility])    ED Discharge Orders    None       Margarita Mail, PA-C 11/01/17 0800    Jola Schmidt, MD 11/01/17 [phone]

## 2017-11-02 ENCOUNTER — Telehealth: Payer: Self-pay

## 2017-11-02 NOTE — Telephone Encounter (Signed)
Message received from Haze Justin, RN CM requesting a hospital follow up appointment for the patient at [facility].Marland Kitchen  His PCP is Dr [name].   At this time, there are not any hospital follow up appointments available. The patient has the contact # for the clinic on his AVS and he will need to call to check availability.   Update provided to Orpha Bur, RN CM

## 2017-11-04 ENCOUNTER — Telehealth: Payer: Self-pay | Admitting: Family Medicine

## 2017-11-04 NOTE — Telephone Encounter (Signed)
Faxed patient's ICD 10 form to [facility] [phone].

## 2017-11-10 ENCOUNTER — Telehealth: Payer: Self-pay | Admitting: Family Medicine

## 2017-11-10 NOTE — Telephone Encounter (Signed)
Call placed to [facility] [phone] regarding ICD 10 form. Spoke with Ms. Redmond Pulling and she informed me that caseworker  Ms. Jordan Hawks was not in the office therefore she couldn't confirm if they had received ICD 10 form or not. Ms. Redmond Pulling took my information and will have [place] return my call tomorrow or Monday.

## 2017-11-16 ENCOUNTER — Other Ambulatory Visit: Payer: Self-pay

## 2017-11-16 ENCOUNTER — Emergency Department (HOSPITAL_COMMUNITY)
Admission: EM | Admit: 2017-11-16 | Discharge: 2017-11-16 | Disposition: A | Discharge status: Home / Self Care | Payer: Medicaid Other | Attending: Emergency Medicine | Admitting: Emergency Medicine

## 2017-11-16 DIAGNOSIS — Z6379 Other stressful life events affecting family and household: Secondary | ICD-10-CM | POA: Insufficient documentation

## 2017-11-16 DIAGNOSIS — Z79899 Other long term (current) drug therapy: Secondary | ICD-10-CM | POA: Diagnosis not present

## 2017-11-16 DIAGNOSIS — R569 Unspecified convulsions: Secondary | ICD-10-CM | POA: Insufficient documentation

## 2017-11-16 LAB — CBG MONITORING, ED: GLUCOSE-CAPILLARY: 88 mg/dL (ref 65–99)
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
## 2017-11-16 NOTE — Clinical Social Work Note (Signed)
Clinical Social Work Assessment  Patient Details  Name: Franklin [name] MRN: [id number] Date of Birth: [birth year]  Date of referral:  11/16/17               Reason for consult:  Housing Concerns/Homelessness                Permission sought to share information with:  Family Supports, Other(Home Programmer, applications) Permission granted to share information::  Yes, Verbal Permission Granted  Name::     Franklin [name]   Agency::     Relationship::     Contact Information:     Housing/Transportation Living arrangements for the past 2 months:  Tour manager of Information:  Patient, Spouse, Other (Comment Required)(Home health nurse) Patient Interpreter Needed:  None Criminal Activity/Legal Involvement Pertinent to Current Situation/Hospitalization:    Significant Relationships:  Spouse Lives with:  Spouse Do you feel safe going back to the place where you live?  Yes Need for family participation in patient care:  Yes (Comment)  Care giving concerns:  CSW consulted due to living conditions for pt and pt's Franklin [name].     Social Worker assessment / plan: CSW met with pt, pt's Franklin [name], and home health nurse. Home health nurse helps pt everyday. Pt and pt's Franklin [name] reported that the boarding house they are renting an apartment from is disgusting. Stated that bathroom does not. Stated they are unable to use the toilet or shower. CSW provided family with information about housing resources in [place], other boarding homes, [place], Air cabin crew of [place], [place], [place], Social research officer, government. CSW reviewed resources with pt and pt's support system. CSW encouraged pt and pt's support system to reach out to resources to see availability of new apartments and help that may be provided from [place] and legal aid.    Employment status:  Disabled (Comment on whether or not currently receiving Disability) Insurance information:  Medicaid In [place] PT Recommendations:  Not assessed at this  time Information / Referral to community resources:  [employer] of the [place], Systems developer, Other (Comment Required)(Housing Resources, Department of Social Services)  Patient/Family's Response to care:  Pt and pt's support system thankful for resources provided. Pt and pt's supports stated they will reach out to resources tomorrow.   Patient/Family's Understanding of and Emotional Response to Diagnosis, Current Treatment, and Prognosis:  Pt stated the stress from the housing situation is taking a toll on him. He states he goes to [place]. CSW encouraged pt to discuss his living situation with them. Pt agreed to do so. Pt agreeable to reaching out to resources to see availability of assistance.   Emotional Assessment Appearance:  Appears stated age Attitude/Demeanor/Rapport:    Affect (typically observed):  Appropriate, Pleasant, Accepting, Calm Orientation:  Oriented to Self, Oriented to Place, Oriented to  Time, Oriented to Situation Alcohol / Substance use:    Psych involvement (Current and /or in the community):  No (Comment)  Discharge Needs  Concerns to be addressed:  Home Safety Concerns(Boarding House Cleanliness ) Readmission within the last 30 days:  No Current discharge risk:  None Barriers to Discharge:  No Barriers Identified   Wendelyn Breslow, LCSW 11/16/2017, 7:54 PM

## 2017-11-16 NOTE — ED Notes (Signed)
Discharge instructions discussed with Pt. Pt verbalized understanding. Pt stable and ambulatory.    

## 2017-11-16 NOTE — ED Notes (Signed)
Patient states he does not want an IV at this time.

## 2017-11-16 NOTE — ED Provider Notes (Signed)
[facility] EMERGENCY DEPARTMENT Provider Note   CSN: [id number] Arrival date & time: 11/16/17  1630     History   Chief Complaint Chief Complaint  Patient presents with  . Fall  . Seizures    HPI Franklin [name] is a 37 y.o. male.  The history is provided by the patient and a caregiver. No language interpreter was used.  Fall   Seizures      Franklin [name] is a 37 y.o. male who presents to the Emergency Department complaining of possible seizure.  Level V caveat due to confusion.  He has a hx/o seizure disorder, bipolar d/o, pseudoseizures. He states that he felt a seizure coming on and laid down on a park bench and then he had a witness seizure that lasted about five minutes with tonic chronic activity. Following the event he was confused and on conversant. On evaluation in the ED he states that he is back at his baseline and states that he is under increased stress because of housing issues. Her current home does not have a working bathroom. He also states that he is not compliant with his medications because he does not like the way they make him feel.    Past Medical History:  Diagnosis Date  . Bipolar 1 disorder ([place])   . Chronic headaches   . Conversion disorder with seizures or convulsions   . 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 Depression   . Hx of electroencephalogram 12/2014   normal  . Mild cognitive impairment   . Psychogenic nonepileptic seizure   . Schizophrenic disorder ([place])   . Seizures [facility])     Patient Active Problem List   Diagnosis Date Noted  . Psychogenic nonepileptic seizure 02/18/2017  . Cholelithiasis 01/24/2017  . 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 Abnormal EKG 11/30/2015  . Elevated troponin 11/30/2015  . Bipolar affective disorder, most recent episode unspecified type, remission status unspecified 03/11/2015    . Bipolar affective disorder ([place]) 01/08/2015  . Generalized idiopathic epilepsy and epileptic syndromes, without status epilepticus, not intractable ([place]) 11/27/2014  . Seizure disorder ([place]) 10/14/2014    No past surgical history on file.      Home Medications    Prior to Admission medications   Medication Sig Start Date End Date Taking? Authorizing Provider  acetaminophen (TYLENOL) 500 MG tablet Take 1,000 mg by mouth every 6 (six) hours as needed for moderate pain.   Yes [provider]  omeprazole (PRILOSEC) 40 MG capsule Take 1 capsule (40 mg total) by mouth daily. 08/23/17  Yes Cameron Sprang, MD  Oxcarbazepine (TRILEPTAL) 300 MG tablet Take 1/2 tablet twice a day Patient taking differently: Take 150 mg by mouth 2 (two) times daily.  08/23/17  Yes Cameron Sprang, MD  chlorpheniramine-phenylephrine (CARDEC) 1-3.5 MG/ML LIQD Take 0.75 mLs by mouth 4 (four) times daily as needed. Patient not taking: Reported on 11/01/2017 10/15/17   Carmin Muskrat, MD  oseltamivir (TAMIFLU) 75 MG capsule Take 1 capsule (75 mg total) by mouth 2 (two) times daily. Patient not taking: Reported on 10/31/2017 06/28/17   Charlott Rakes, MD    Family History Family History  Problem Relation Age of Onset  . Heart disease Mother     Social History Social History   Tobacco Use  . Smoking status: Never Smoker  . Smokeless tobacco: Never Used  Substance Use Topics  .  Alcohol use: Yes    Alcohol/week: 0.0 oz    Comment: 40oz today  . Drug use: No     Allergies   Coconut oil; Other; Codeine; Depakote [divalproex sodium]; Penicillins; and Tape   Review of Systems Review of Systems  Neurological: Positive for seizures.  All other systems reviewed and are negative.    Physical Exam Updated Vital Signs BP 132/65 (BP Location: Right Arm)   Pulse 65   Temp 99.5 F (37.5 C) (Oral)   Resp 18   Ht 6' (1.829 m)   Wt 74.8 kg (165 lb)   SpO2 98%   BMI 22.38 kg/m   Physical Exam   Constitutional: He is oriented to person, place, and time. He appears well-developed and well-nourished.  HENT:  Head: Normocephalic and atraumatic.  Cardiovascular: Normal rate and regular rhythm.  No murmur heard. Pulmonary/Chest: Effort normal and breath sounds normal. No respiratory distress.  Abdominal: Soft. There is no tenderness. There is no rebound and no guarding.  Musculoskeletal: He exhibits no edema or tenderness.  Neurological: He is alert and oriented to person, place, and time. No cranial nerve deficit. Coordination normal.  Normal gait. Five out of five strength in all four extremities  Skin: Skin is warm and dry.  Psychiatric: He has a normal mood and affect. His behavior is normal.  Nursing note and vitals reviewed.    ED Treatments / Results  Labs (all labs ordered are listed, but only abnormal results are displayed) Labs Reviewed  CBG MONITORING, ED    EKG EKG Interpretation  Date/Time:  Wednesday November 16 2017 17:32:33 EDT Ventricular Rate:  53 PR Interval:  148 QRS Duration: 100 QT Interval:  418 QTC Calculation: 392 R Axis:   -16 Text Interpretation:  Sinus bradycardia Junctional ST depression, probably normal Borderline ECG Confirmed by Quintella Reichert [phone]) on 11/16/2017 5:43:50 PM   Radiology No results found.  Procedures Procedures (including critical care time)  Medications Ordered in ED Medications - No data to display   Initial Impression / Assessment and Plan / ED Course  I have reviewed the triage vital signs and the nursing notes.  Pertinent labs & imaging results that were available during my care of the patient were reviewed by me and considered in my medical decision making (see chart for details).     Patient here following episode of seizure like activity. He did complain of neck pain to triage nurse but on my assessment he denies any pain or complaints. He does state that he is not been taking his medications and he is  under increased stress at home because they are looking for a new home. Social worker met with patient to offer housing options. Discussed with patient and family importance of compliance with medications as well as outpatient follow-up and return precautions.  Final Clinical Impressions(s) / ED Diagnoses   Final diagnoses:  Seizure-like activity ([place])  Stressful life events affecting family and household    ED Discharge Orders    None       Quintella Reichert, MD 11/16/17 2354

## 2017-11-17 ENCOUNTER — Emergency Department (HOSPITAL_COMMUNITY)
Admission: EM | Admit: 2017-11-17 | Discharge: 2017-11-17 | Disposition: A | Discharge status: Home / Self Care | Payer: Medicaid Other | Attending: Emergency Medicine | Admitting: Emergency Medicine

## 2017-11-17 ENCOUNTER — Telehealth: Payer: Self-pay | Admitting: Neurology

## 2017-11-17 ENCOUNTER — Encounter (HOSPITAL_COMMUNITY): Payer: Self-pay

## 2017-11-17 ENCOUNTER — Other Ambulatory Visit: Payer: Self-pay

## 2017-11-17 DIAGNOSIS — Z79899 Other long term (current) drug therapy: Secondary | ICD-10-CM | POA: Insufficient documentation

## 2017-11-17 DIAGNOSIS — G40909 Epilepsy, unspecified, not intractable, without status epilepticus: Secondary | ICD-10-CM | POA: Insufficient documentation

## 2017-11-17 DIAGNOSIS — R569 Unspecified convulsions: Secondary | ICD-10-CM | POA: Diagnosis present

## 2017-11-17 NOTE — Discharge Instructions (Signed)
1.  Take a full 300 mg dose of Trileptal tonight.  Take your half pill as usual tomorrow morning. 2.  Call your neurologist in the morning to schedule a recheck and discuss your medication dosing.

## 2017-11-17 NOTE — ED Notes (Signed)
Bed: QH47 Expected date:  Expected time:  Means of arrival:  Comments: EMS-sz

## 2017-11-17 NOTE — ED Provider Notes (Signed)
[name] Note   CSN: [id number] Arrival date & time: 11/17/17  1314     History   Chief Complaint Chief Complaint  Patient presents with  . Seizures    HPI Franklin [name] is a 37 y.o. male.  HPI Patient has history of nonepileptic seizures documented in EMR as psychogenic.  Patient was seen in the emergency department yesterday for seizure.  His caregiver reports that he took his Trileptal last night as prescribed.  His dose has been cut down from 300 twice daily to 150 twice daily.  She reports at 300 mg twice daily he was sleeping all the time.  She reports she was on her way to work this morning when she was called that the patient was home having seizures.  She reports she turned around and came home and found him on the floor shaking and hitting his head on the floor. Past Medical History:  Diagnosis Date  . Bipolar 1 disorder ([employer])   . Chronic headaches   . Conversion disorder with seizures or convulsions   . 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 Depression   . Hx of electroencephalogram 12/2014   normal  . Mild cognitive impairment   . Psychogenic nonepileptic seizure   . Schizophrenic disorder ([place]lin [name])   . Seizures [facility])     Patient Active Problem List   Diagnosis Date Noted  . Psychogenic nonepileptic seizure 02/18/2017  . Cholelithiasis 01/24/2017  . 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 Abnormal EKG 11/30/2015  . Elevated troponin 11/30/2015  . Bipolar affective disorder, most recent episode unspecified type, remission status unspecified 03/11/2015  . Bipolar affective disorder ([place]) 01/08/2015  . Generalized idiopathic epilepsy and epileptic syndromes, without status epilepticus, not intractable ([place]) 11/27/2014  . Seizure disorder ([place]) 10/14/2014    History reviewed. No pertinent surgical  history.      Home Medications    Prior to Admission medications   Medication Sig Start Date End Date Taking? Authorizing Provider  acetaminophen (TYLENOL) 500 MG tablet Take 1,000 mg by mouth every 6 (six) hours as needed for moderate pain.   Yes [provider]  omeprazole (PRILOSEC) 40 MG capsule Take 1 capsule (40 mg total) by mouth daily. 08/23/17  Yes Cameron Sprang, MD  Oxcarbazepine (TRILEPTAL) 300 MG tablet Take 1/2 tablet twice a day Patient taking differently: Take 150 mg by mouth 2 (two) times daily.  08/23/17  Yes Cameron Sprang, MD  chlorpheniramine-phenylephrine (CARDEC) 1-3.5 MG/ML LIQD Take 0.75 mLs by mouth 4 (four) times daily as needed. Patient not taking: Reported on 11/01/2017 10/15/17   Carmin Muskrat, MD  oseltamivir (TAMIFLU) 75 MG capsule Take 1 capsule (75 mg total) by mouth 2 (two) times daily. Patient not taking: Reported on 10/31/2017 06/28/17   Charlott Rakes, MD    Family History Family History  Problem Relation Age of Onset  . Heart disease Mother     Social History Social History   Tobacco Use  . Smoking status: Never Smoker  . Smokeless tobacco: Never Used  Substance Use Topics  . Alcohol use: Yes    Alcohol/week: 0.0 oz    Comment: 40oz today  . Drug use: No     Allergies   Coconut oil; Other; Codeine; Depakote [divalproex sodium]; Penicillins; and Tape   Review of Systems Review of Systems 10 Systems reviewed and are negative for acute  change except as noted in the HPI.  Physical Exam Updated Vital Signs BP 126/74   Pulse (!) 53   Temp 97.8 F (36.6 C) (Oral)   Resp 19   Ht 6' (1.829 m)   Wt 74.8 kg (165 lb)   SpO2 100%   BMI 22.38 kg/m   Physical Exam  Constitutional: He appears well-developed and well-nourished.  Patient is alert and interactive.  He is in no acute distress.  HENT:  Head: Normocephalic and atraumatic.  Full scalp and neck are palpated.  No hematomas.  No facial injury.  Pupils symmetric.  No  tongue laceration.  She has significant gingival hypertrophy.  Eyes: Pupils are equal, round, and reactive to light. EOM are normal.  Neck: Neck supple.  No step-off no bony point tenderness.  Cardiovascular: Normal rate, regular rhythm, normal heart sounds and intact distal pulses.  Pulmonary/Chest: Effort normal and breath sounds normal. He exhibits no tenderness.  Abdominal: Soft. Bowel sounds are normal. He exhibits no distension. There is no tenderness.  Musculoskeletal: Normal range of motion. He exhibits no edema or tenderness.  No contusions or abrasions to extremities.  Neurological: He is alert. He has normal strength. Coordination normal. GCS eye subscore is 4. GCS verbal subscore is 5. GCS motor subscore is 6.  Patient is alert and does respond to questions.  He exhibits significant cognitive delay.  Caregiver reports this is close to baseline but he is much less interactive after a seizure. no Motor deficit.  Skin: Skin is warm, dry and intact.  Psychiatric: He has a normal mood and affect.     ED Treatments / Results  Labs (all labs ordered are listed, but only abnormal results are displayed) Labs Reviewed - No data to display  EKG None  Radiology No results found.  Procedures Procedures (including critical care time)  Medications Ordered in ED Medications - No data to display   Initial Impression / Assessment and Plan / ED Course  I have reviewed the triage vital signs and the nursing notes.  Pertinent labs & imaging results that were available during my care of the patient were reviewed by me and considered in my medical decision making (see chart for details).      Final Clinical Impressions(s) / ED Diagnoses   Final diagnoses:  Seizure disorder ([place])  At this time, findings for uncomplicated seizure self-limited.  No tongue laceration or physical injury identified by examination.  Patient has clinically well appearance.  At this time do not suspect  metabolic derangement.  EMR documentation suggests that seizures are likely psychogenic in origin.  Patient's caregiver also reports that sometimes he will skip his Trileptal doses when he is feeling well.  She does however know for fact that he took yesterday evening and this morning's dose.  Patient has been made to take a 300 mg evening dose as the patient will be sleeping anyways and take the half dose in the morning and called her neurologist.  ED Discharge Orders    None       Charlesetta Shanks, MD 11/17/17 1540

## 2017-11-17 NOTE — ED Triage Notes (Signed)
BIBA from home. Pt had seizure today, witnessed by wife for approx 2 mins. Was seen for same yesterday @[facility] . States he took his meds yesterday and today. Pt alert only to self, which is normal for him. Pt given 5 of versed IM PTA due to climbing off stretcher. C collar in place because pt found down on the ground . 128/65 HR64 97% RA 16RR FSBG 92.

## 2017-11-18 ENCOUNTER — Telehealth: Payer: Self-pay | Admitting: Family Medicine

## 2017-11-18 ENCOUNTER — Other Ambulatory Visit: Payer: Self-pay

## 2017-11-18 DIAGNOSIS — F445 Conversion disorder with seizures or convulsions: Secondary | ICD-10-CM

## 2017-11-18 DIAGNOSIS — F319 Bipolar disorder, unspecified: Secondary | ICD-10-CM

## 2017-11-18 NOTE — Telephone Encounter (Signed)
Call placed to [facility] [phone] regarding patient's  ICD 10 form that was faxed on 11/04/2017. Voicemail recording stated that I called after hours. Left a voice message asking for a call back at [phone]. They can also speak with Opal Sidles, case Freight forwarder.

## 2017-11-18 NOTE — Telephone Encounter (Signed)
Spoke with pt relaying message below.  Pt seemed agreeable to seeing psychiatry.  Referral placed for [facility], phone number given to pt to schedule appointment.

## 2017-11-18 NOTE — Telephone Encounter (Signed)
Apparently pt was irate when he came to the office yesterday - just after I left for the day. He has been seen multiple times in ED since LOV.  ED notes state that seizures most likely psychogenic and that pt is non-compliant with medications.  You have work in slots available for Thursday, June 27.  Please advise.

## 2017-11-18 NOTE — Telephone Encounter (Signed)
He has difficulties understanding psychogenic seizures despite repeated explanations every visit. Pls reiterate his seizures are stress seizures and seeing Psychiatry is what would be helpful for his type of seizures.

## 2017-11-19 ENCOUNTER — Emergency Department (HOSPITAL_COMMUNITY)
Admission: EM | Admit: 2017-11-19 | Discharge: 2017-11-19 | Disposition: A | Discharge status: Home / Self Care | Payer: Medicaid Other | Attending: Emergency Medicine | Admitting: Emergency Medicine

## 2017-11-19 ENCOUNTER — Encounter (HOSPITAL_COMMUNITY): Payer: Self-pay

## 2017-11-19 ENCOUNTER — Other Ambulatory Visit: Payer: Self-pay

## 2017-11-19 DIAGNOSIS — Z79899 Other long term (current) drug therapy: Secondary | ICD-10-CM | POA: Insufficient documentation

## 2017-11-19 DIAGNOSIS — F319 Bipolar disorder, unspecified: Secondary | ICD-10-CM | POA: Diagnosis not present

## 2017-11-19 DIAGNOSIS — R569 Unspecified convulsions: Secondary | ICD-10-CM | POA: Diagnosis present

## 2017-11-19 MED ORDER — GI COCKTAIL ~~LOC~~
30.0000 mL | Freq: Once | ORAL | Status: AC
  Administered 2017-11-19: 30 mL via ORAL
  Filled 2017-11-19: qty 30

## 2017-11-19 NOTE — ED Provider Notes (Signed)
[facility] DEPT Provider Note   CSN: [id number] Arrival date & time: 11/19/17  1306     History   Chief Complaint Chief Complaint  Patient presents with  . Seizures    HPI Franklin [name] is a 37 y.o. male with a history of psychogenic nonepileptic seizure, conversion disorder, and bipolar 1 disorder who presents to the emergency department by EMS with a chief complaint of seizure-like activity.  He has a history of nonepileptic seizures documented in his electronic medical record as psychogenic.  He was previously seen 2 times within the last 7 days for seizure. He takes oxcarbazepine and has not missed any doses last few weeks.  His wife reports that prior to this week, the patient was not taking his medication for several weeks and did not have any seizures.  She reports that after he was seen in the ED 2 days ago that he was advised to follow-up with his neurologist.  She reports that they went to the neurologist office after leaving the ED, but they were unable to be seen.  She reports that she left her number as well as the patient's caregivers number to have them follow-up.  She states that his neurologist thinks that he has a brain tumor, and the patient's wife thinks that the patient needs a CT scan or MRI.  He reports that he has been sleeping well.  He denies smoking cigarettes, alcohol use, or recreational or IV drug use.  He reports that he has been in good health and denies recent fever, chills, URI symptoms, abdominal pain, nausea, vomiting, or diarrhea.  No recent trauma or injuries.  His next follow-up appointment with his neurologist is 02/22/18.   The history is provided by the patient. No language interpreter was used.    Past Medical History:  Diagnosis Date  . Bipolar 1 disorder ([place])   . Chronic headaches   . Conversion disorder with seizures or convulsions   . 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 Depression   . Hx of electroencephalogram 12/2014   normal  . Mild cognitive impairment   . Psychogenic nonepileptic seizure   . Schizophrenic disorder ([place])   . Seizures [facility])     Patient Active Problem List   Diagnosis Date Noted  . Psychogenic nonepileptic seizure 02/18/2017  . Cholelithiasis 01/24/2017  . 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 Abnormal EKG 11/30/2015  . Elevated troponin 11/30/2015  . Bipolar affective disorder, most recent episode unspecified type, remission status unspecified 03/11/2015  . Bipolar affective disorder ([place]) 01/08/2015  . Generalized idiopathic epilepsy and epileptic syndromes, without status epilepticus, not intractable ([place]) 11/27/2014  . Seizure disorder ([place]) 10/14/2014    History reviewed. No pertinent surgical history.      Home Medications    Prior to Admission medications   Medication Sig Start Date End Date Taking? Authorizing Provider  acetaminophen (TYLENOL) 500 MG tablet Take 1,000 mg by mouth every 6 (six) hours as needed for moderate pain.   Yes [provider]  Oxcarbazepine (TRILEPTAL) 300 MG tablet Take 1/2 tablet twice a day Patient taking differently: Take 150 mg by mouth 2 (two) times daily.  08/23/17  Yes Cameron Sprang, MD    Family History Family History  Problem Relation Age of Onset  . Heart disease Mother     Social History Social History   Tobacco Use  .  Smoking status: Never Smoker  . Smokeless tobacco: Never Used  Substance Use Topics  . Alcohol use: Not Currently    Alcohol/week: 0.0 oz  . Drug use: No     Allergies   Coconut oil; Other; Codeine; Depakote [divalproex sodium]; Penicillins; and Tape   Review of Systems Review of Systems  Constitutional: Negative for appetite change and fever.  Respiratory: Negative for shortness of breath.   Cardiovascular: Negative for chest pain.  Gastrointestinal:  Negative for abdominal pain.  Genitourinary: Negative for dysuria.  Musculoskeletal: Negative for back pain.  Skin: Negative for rash.  Allergic/Immunologic: Negative for immunocompromised state.  Neurological: Positive for seizures. Negative for headaches.  Psychiatric/Behavioral: Negative for confusion.   Physical Exam Updated Vital Signs BP 124/73 (BP Location: Left Arm)   Pulse (!) 52   Resp 15   Ht 6' (1.829 m)   Wt 74.8 kg (165 lb)   SpO2 100%   BMI 22.38 kg/m   Physical Exam  Constitutional: He appears well-developed. No distress.  HENT:  Head: Normocephalic.  Mouth/Throat: Oropharyngeal exudate:    Eyes: Pupils are equal, round, and reactive to light. Conjunctivae and EOM are normal.  Neck: Neck supple.  Cardiovascular: Normal rate, regular rhythm, normal heart sounds and intact distal pulses. Exam reveals no gallop and no friction rub.  No murmur heard. Pulmonary/Chest: Effort normal and breath sounds normal. No stridor. No respiratory distress. He has no wheezes. He has no rales. He exhibits no tenderness.  Abdominal: Soft. He exhibits no distension and no mass. There is no tenderness. There is no rebound and no guarding. No hernia.  Musculoskeletal: He exhibits no tenderness.  Neurological: He is alert.  Cranial nerves 2-12 intact. Finger-to-nose is normal. 5/5 motor strength of the bilateral upper and lower extremities. Moves all four extremities. Negative Romberg. Ambulatory without difficulty. NVI.    Skin: Skin is warm and dry. Capillary refill takes less than 2 seconds. He is not diaphoretic.  Psychiatric: His behavior is normal.  Nursing note and vitals reviewed.  ED Treatments / Results  Labs (all labs ordered are listed, but only abnormal results are displayed) Labs Reviewed - No data to display  EKG None  Radiology No results found.  Procedures Procedures (including critical care time)  Medications Ordered in ED Medications  gi cocktail  (Maalox,Lidocaine,Donnatal) (30 mLs Oral Given 11/19/17 1411)     Initial Impression / Assessment and Plan / ED Course  I have reviewed the triage vital signs and the nursing notes.  Pertinent labs & imaging results that were available during my care of the patient were reviewed by me and considered in my medical decision making (see chart for details).     37 year old male with a history of psychogenic nonepileptic seizure, conversion disorder, and bipolar 1 disorder who presents to the emergency department with a chief complaint of seizure-like activity.  EMR documentation suggest that seizures are likely psychogenic in origin.  Medical chart reviewed.  The patient's wife contacted his neurosurgeon 2 days ago who recommended that the patient follow-up with behavioral health.  The patient has not followed up with behavioral health.  No tongue laceration or physical injury on exam.  No focal neurologic deficits.  At this time, I feel that no  further urgent or emergent work-up is indicated.  I have recommended that the patient follow-up with behavioral health and continue to see his neurologist.  He has been compliant with his home Trileptal.  Strict return precautions given.  He is  hemodynamically stable in no acute distress.  He is safe for discharge home at this time with outpatient follow-up.  Final Clinical Impressions(s) / ED Diagnoses   Final diagnoses:  Seizure-like activity [facility])    ED Discharge Orders    None       Joanne Gavel, PA-C 11/20/17 0037    Isla Pence, MD 11/20/17 [phone]

## 2017-11-19 NOTE — ED Notes (Signed)
Bed: WA10 Expected date: 11/19/17 Expected time: 1:06 PM Means of arrival: Ambulance Comments: Seizure, Post ictal

## 2017-11-19 NOTE — Discharge Instructions (Signed)
Thank you for allowing me to provide your care today in the emergency department.  Please call [facility] when they reopen to schedule a follow-up appointment as soon as possible.  They can also refill your antidepressant.  You can also call your neurologist office to see if you can get on the cancellation list if you need to be seen again before September.   Return to the emergency department if you have thoughts of hurting herself or others, develop a high fever, persistent seizure-like activity, or other new, concerning symptoms.

## 2017-11-19 NOTE — ED Notes (Signed)
ED Provider at bedside. 

## 2017-11-19 NOTE — ED Triage Notes (Signed)
Pt comes from home. Pt arrived via [facility]. Pt family called 911 due to patient having what they described as "absence seizure". Pt had no loss of bowel or bladder however patient was very confused and at one point a little combative. Pt is now AOx4.

## 2017-11-22 ENCOUNTER — Telehealth: Payer: Self-pay | Admitting: Family Medicine

## 2017-11-22 NOTE — Telephone Encounter (Signed)
Call placed to [facility] [phone] to check on the status of patient's ICD-10 form. Spoke with [place] and she informed me that form was received but that two days later patient switched over to a different agency. [place] also stated that patient was in between location, as the address on the form was different from what's on Epic ([address]	[place]).

## 2017-11-23 ENCOUNTER — Telehealth: Payer: Self-pay

## 2017-11-23 ENCOUNTER — Other Ambulatory Visit: Payer: Self-pay

## 2017-11-23 DIAGNOSIS — F445 Conversion disorder with seizures or convulsions: Secondary | ICD-10-CM

## 2017-11-23 NOTE — Telephone Encounter (Signed)
CSW received phone call from pt. Pt stated that he is having difficulty paying for his depression medication. Pt receives his medication from [place]. Pt has Medicaid. CSW spoke with RN CM, CSW suggested that pt ask fee to be waived by [employer]. Pt stated that he has done that and [facility] denied waiving the fee. Pt is seen by [facility] the [place]. CSW suggested pt call [facility] [place] for assistance on paying for his medication. Pt agreeable to this.   Wendelyn Breslow, [facility]  [phone]

## 2017-11-28 ENCOUNTER — Encounter: Payer: Self-pay | Admitting: Neurology

## 2017-12-19 ENCOUNTER — Encounter (HOSPITAL_COMMUNITY): Payer: Self-pay | Admitting: Emergency Medicine

## 2017-12-19 ENCOUNTER — Ambulatory Visit (HOSPITAL_COMMUNITY)
Admission: EM | Admit: 2017-12-19 | Discharge: 2017-12-19 | Disposition: A | Discharge status: Home / Self Care | Payer: Medicaid Other | Attending: Family Medicine | Admitting: Family Medicine

## 2017-12-19 DIAGNOSIS — R21 Rash and other nonspecific skin eruption: Secondary | ICD-10-CM

## 2017-12-19 MED ORDER — CLOTRIMAZOLE 1 % EX CREA: TOPICAL_CREAM | CUTANEOUS | 0 refills | Status: DC

## 2017-12-19 NOTE — ED Provider Notes (Signed)
Franklin [name]    CSN: [id number] Arrival date & time: 12/19/17  1121     History   Chief Complaint Chief Complaint  Patient presents with  . Rash    HPI Franklin [name] is a 37 y.o. male.   History of bipolar disorder presenting today for evaluation of rash to his groin.  Patient states that he just noticed this rash this morning.  Is associated with pain, had mild pain yesterday, but worsened today.  Some mild associated itching.  He has put some Vaseline as well as Neosporin on this area.  Denies any testicular pain or scrotal swelling.  Denies any dysuria, increased frequency or penile discharge.  Denies any swollen lymph nodes.  HPI  Past Medical History:  Diagnosis Date  . Bipolar 1 disorder ([place])   . Chronic headaches   . Conversion disorder with seizures or convulsions   . 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 Depression   . Hx of electroencephalogram 12/2014   normal  . Mild cognitive impairment   . Psychogenic nonepileptic seizure   . Schizophrenic disorder ([place])   . Seizures [facility])     Patient Active Problem List   Diagnosis Date Noted  . Psychogenic nonepileptic seizure 02/18/2017  . Cholelithiasis 01/24/2017  . 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 Abnormal EKG 11/30/2015  . Elevated troponin 11/30/2015  . Bipolar affective disorder, most recent episode unspecified type, remission status unspecified 03/11/2015  . Bipolar affective disorder ([place]) 01/08/2015  . Generalized idiopathic epilepsy and epileptic syndromes, without status epilepticus, not intractable ([place]) 11/27/2014  . Seizure disorder ([place]) 10/14/2014    History reviewed. No pertinent surgical history.     Home Medications    Prior to Admission medications   Medication Sig Start Date End Date Taking? Authorizing Provider  acetaminophen (TYLENOL) 500 MG tablet Take 1,000 mg by  mouth every 6 (six) hours as needed for moderate pain.    [provider]  clotrimazole (LOTRIMIN) 1 % cream Apply to affected area 2 times daily 12/19/17   Wieters, [employer] C, PA-C  Oxcarbazepine (TRILEPTAL) 300 MG tablet Take 1/2 tablet twice a day Patient taking differently: Take 150 mg by mouth 2 (two) times daily.  08/23/17   Cameron Sprang, MD    Family History Family History  Problem Relation Age of Onset  . Heart disease Mother     Social History Social History   Tobacco Use  . Smoking status: Never Smoker  . Smokeless tobacco: Never Used  Substance Use Topics  . Alcohol use: Not Currently    Alcohol/week: 0.0 oz  . Drug use: No     Allergies   Coconut oil; Other; Codeine; Depakote [divalproex sodium]; Penicillins; and Tape   Review of Systems Review of Systems  Constitutional: Negative for fatigue and fever.  Eyes: Negative for redness, itching and visual disturbance.  Respiratory: Negative for shortness of breath.   Cardiovascular: Negative for chest pain and leg swelling.  Gastrointestinal: Negative for nausea and vomiting.  Genitourinary: Negative for discharge, dysuria, frequency, genital sores, penile pain, penile swelling, scrotal swelling and testicular pain.  Musculoskeletal: Negative for arthralgias and myalgias.  Skin: Positive for color change and rash. Negative for wound.  Neurological: Negative for dizziness, syncope, weakness, light-headedness and headaches.     Physical Exam Triage Vital Signs ED Triage Vitals [12/19/17 1141]  Enc Vitals Group  BP 118/83     Pulse Rate 65     Resp 18     Temp 98.2 F (36.8 C)     Temp Source Oral     SpO2 100 %     Weight      Height      Head Circumference      Peak Flow      Pain Score      Pain Loc      Pain Edu?      Excl. in [place]?    No data found.  Updated Vital Signs BP 118/83 (BP Location: Left Arm)   Pulse 65   Temp 98.2 F (36.8 C) (Oral)   Resp 18   SpO2 100%   Visual  Acuity Right Eye Distance:   Left Eye Distance:   Bilateral Distance:    Right Eye Near:   Left Eye Near:    Bilateral Near:     Physical Exam  Constitutional: He is oriented to person, place, and time. He appears well-developed and well-nourished.  No acute distress  HENT:  Head: Normocephalic and atraumatic.  Nose: Nose normal.  Eyes: Conjunctivae are normal.  Neck: Neck supple.  Cardiovascular: Normal rate.  Pulmonary/Chest: Effort normal. No respiratory distress.  Abdominal: He exhibits no distension.  Genitourinary: Penis normal.  Genitourinary Comments: Macular erythematous lesions to bilateral groin area extending on to proximal thigh, does not involve the scrotum, nontender to palpation of bilateral testicles or epididymis bilaterally, no rash or lesions seen on penile shaft  Musculoskeletal: Normal range of motion.  Neurological: He is alert and oriented to person, place, and time.  Skin: Skin is warm and dry.  Psychiatric: He has a normal mood and affect.  Nursing note and vitals reviewed.      UC Treatments / Results  Labs (all labs ordered are listed, but only abnormal results are displayed) Labs Reviewed - No data to display  EKG None  Radiology No results found.  Procedures Procedures (including critical care time)  Medications Ordered in UC Medications - No data to display  Initial Impression / Assessment and Plan / UC Course  I have reviewed the triage vital signs and the nursing notes.  Pertinent labs & imaging results that were available during my care of the patient were reviewed by me and considered in my medical decision making (see chart for details).     Rash concerning for yeast, will treat with clotrimazole twice daily, discussed keeping dry.Discussed strict return precautions. Patient verbalized understanding and is agreeable with plan.  Final Clinical Impressions(s) / UC Diagnoses   Final diagnoses:  Rash and nonspecific skin  eruption     Discharge Instructions     Please apply clotrimazole cream twice daily to area, please keep dry, dry well after showering or sweating  Tylenol and ibuprofen for pain  Follow-up if symptoms not improving or worsening    ED Prescriptions    Medication Sig Dispense Auth. Provider   clotrimazole (LOTRIMIN) 1 % cream Apply to affected area 2 times daily 28 g Wieters, Hallie C, PA-C     Controlled Substance Prescriptions [place] Controlled Substance Registry consulted? Not Applicable   Janith Lima, [place] 12/19/17 1244

## 2017-12-19 NOTE — Discharge Instructions (Signed)
Please apply clotrimazole cream twice daily to area, please keep dry, dry well after showering or sweating  Tylenol and ibuprofen for pain  Follow-up if symptoms not improving or worsening

## 2017-12-19 NOTE — ED Triage Notes (Signed)
Pt with painful rash in groin area

## 2017-12-20 ENCOUNTER — Encounter (HOSPITAL_COMMUNITY): Payer: Self-pay

## 2017-12-20 ENCOUNTER — Emergency Department (HOSPITAL_COMMUNITY)
Admission: EM | Admit: 2017-12-20 | Discharge: 2017-12-20 | Disposition: A | Discharge status: Home / Self Care | Payer: Medicaid Other | Attending: Emergency Medicine | Admitting: Emergency Medicine

## 2017-12-20 ENCOUNTER — Other Ambulatory Visit: Payer: Self-pay

## 2017-12-20 DIAGNOSIS — Z79899 Other long term (current) drug therapy: Secondary | ICD-10-CM | POA: Insufficient documentation

## 2017-12-20 DIAGNOSIS — B369 Superficial mycosis, unspecified: Secondary | ICD-10-CM | POA: Insufficient documentation

## 2017-12-20 DIAGNOSIS — B356 Tinea cruris: Secondary | ICD-10-CM | POA: Insufficient documentation

## 2017-12-20 DIAGNOSIS — R319 Hematuria, unspecified: Secondary | ICD-10-CM | POA: Diagnosis present

## 2017-12-20 LAB — URINALYSIS, ROUTINE W REFLEX MICROSCOPIC
Bilirubin Urine: NEGATIVE
GLUCOSE, UA: NEGATIVE mg/dL
HGB URINE DIPSTICK: NEGATIVE
Ketones, ur: NEGATIVE mg/dL
LEUKOCYTES UA: NEGATIVE
Nitrite: NEGATIVE
PROTEIN: NEGATIVE mg/dL
Specific Gravity, Urine: 1.027 (ref 1.005–1.030)
pH: 6 (ref 5.0–8.0)

## 2017-12-20 MED ORDER — CLOTRIMAZOLE 1 % EX CREA: TOPICAL_CREAM | CUTANEOUS | 0 refills | Status: DC

## 2017-12-20 MED ORDER — ACETAMINOPHEN 325 MG PO TABS
650.0000 mg | ORAL_TABLET | Freq: Once | ORAL | Status: AC
  Administered 2017-12-20: 650 mg via ORAL
  Filled 2017-12-20: qty 2

## 2017-12-20 NOTE — ED Notes (Signed)
Pt discharged from ED; instructions provided and scripts given; Pt encouraged to return to ED if symptoms worsen and to f/u with PCP; Pt verbalized understanding of all instructions 

## 2017-12-20 NOTE — ED Provider Notes (Signed)
[facility] EMERGENCY DEPARTMENT Provider Note   CSN: [id number] Arrival date & time: 12/20/17  1093     History   Chief Complaint Chief Complaint  Patient presents with  . Hematuria    Rash in Groin    HPI Franklin [name] is a 37 y.o. male.  Pt c/o mildly pruritic rash to bil groin area for past couple weeks. Also c/o noting a small amount blood in urine x 1. Denies any recent fall or trauma. No abd or flank pain. No dysuria, urgency or other gu c/o. Denies hx same. No anticoag use. No other abnormal bruising or bleeding. No blood in stools. Denies penile discharge.   The history is provided by the patient.  Hematuria  Pertinent negatives include no abdominal pain.    Past Medical History:  Diagnosis Date  . Bipolar 1 disorder ([place])   . Chronic headaches   . Conversion disorder with seizures or convulsions   . 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 Depression   . Hx of electroencephalogram 12/2014   normal  . Mild cognitive impairment   . Psychogenic nonepileptic seizure   . Schizophrenic disorder ([place])   . Seizures [facility])     Patient Active Problem List   Diagnosis Date Noted  . Psychogenic nonepileptic seizure 02/18/2017  . Cholelithiasis 01/24/2017  . 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 Abnormal EKG 11/30/2015  . Elevated troponin 11/30/2015  . Bipolar affective disorder, most recent episode unspecified type, remission status unspecified 03/11/2015  . Bipolar affective disorder ([place]) 01/08/2015  . Generalized idiopathic epilepsy and epileptic syndromes, without status epilepticus, not intractable ([place]) 11/27/2014  . Seizure disorder ([place]) 10/14/2014    History reviewed. No pertinent surgical history.      Home Medications    Prior to Admission medications   Medication Sig Start Date End Date Taking? Authorizing Provider    acetaminophen (TYLENOL) 500 MG tablet Take 1,000 mg by mouth every 6 (six) hours as needed for moderate pain.    [provider]  clotrimazole (LOTRIMIN) 1 % cream Apply to affected area 2 times daily 12/19/17   Wieters, [employer] C, PA-C  Oxcarbazepine (TRILEPTAL) 300 MG tablet Take 1/2 tablet twice a day Patient taking differently: Take 150 mg by mouth 2 (two) times daily.  08/23/17   Cameron Sprang, MD    Family History Family History  Problem Relation Age of Onset  . Heart disease Mother     Social History Social History   Tobacco Use  . Smoking status: Never Smoker  . Smokeless tobacco: Never Used  Substance Use Topics  . Alcohol use: Not Currently    Alcohol/week: 0.0 oz  . Drug use: No     Allergies   Coconut oil; Other; Codeine; Depakote [divalproex sodium]; Penicillins; and Tape   Review of Systems Review of Systems  Constitutional: Negative for fever.  Gastrointestinal: Negative for abdominal pain and blood in stool.  Genitourinary: Positive for hematuria. Negative for dysuria.  Skin: Positive for rash.     Physical Exam Updated Vital Signs There were no vitals taken for this visit.  Physical Exam  Constitutional: He appears well-developed and well-nourished.  HENT:  Mouth/Throat: Oropharynx is clear and moist.  Eyes: Conjunctivae are normal.  Neck: Neck supple. No tracheal deviation present.  Cardiovascular: Normal rate.  Pulmonary/Chest: Effort normal. No accessory muscle usage. No respiratory distress.  Abdominal: He  exhibits no distension.  Genitourinary:  Genitourinary Comments: Normal external gu exam. No penile discharge. No scrotal/testicular mass/tenderness.   Musculoskeletal: He exhibits no edema.  Neurological: He is alert.  Skin: Skin is warm and dry. He is not diaphoretic.  Fungal dermatitis in creases of groin. No cellulitis.   Psychiatric: He has a normal mood and affect.  Nursing note and vitals reviewed.    ED Treatments  / Results  Labs (all labs ordered are listed, but only abnormal results are displayed) Results for orders placed or performed during the hospital encounter of 12/20/17  Urinalysis, Routine w reflex microscopic  Result Value Ref Range   Color, Urine YELLOW YELLOW   APPearance CLEAR CLEAR   Specific Gravity, Urine 1.027 1.005 - 1.030   pH 6.0 5.0 - 8.0   Glucose, UA NEGATIVE NEGATIVE mg/dL   Hgb urine dipstick NEGATIVE NEGATIVE   Bilirubin Urine NEGATIVE NEGATIVE   Ketones, ur NEGATIVE NEGATIVE mg/dL   Protein, ur NEGATIVE NEGATIVE mg/dL   Nitrite NEGATIVE NEGATIVE   Leukocytes, UA NEGATIVE NEGATIVE   EKG None  Radiology No results found.  Procedures Procedures (including critical care time)  Medications Ordered in ED Medications - No data to display   Initial Impression / Assessment and Plan / ED Course  I have reviewed the triage vital signs and the nursing notes.  Pertinent labs & imaging results that were available during my care of the patient were reviewed by me and considered in my medical decision making (see chart for details).  Exam c/w fungal dermatitis/'jock itch'. Recommend clotrimazole cream.   ua sent.   Reviewed nursing notes and prior charts for additional history.   Labs reviewed - UA is normal.  Pt appears stable for d/c.     Final Clinical Impressions(s) / ED Diagnoses   Final diagnoses:  None    ED Discharge Orders    None       Lajean Saver, MD 12/20/17 1023

## 2017-12-20 NOTE — ED Triage Notes (Signed)
Pt arrived via [facility]; pt [place] with c/o of blood in urine and a developing rash in groin; Pt went to urgent care yesterday and was told to get "cream" for a rash; pt states that he has had no recent trauma to area; no hx; pt has Hx of Seizures, Depression; 126/82, 70, 16, 99% on RA

## 2017-12-20 NOTE — Discharge Instructions (Addendum)
It was our pleasure to provide your ER care today - we hope that you feel better.  Keep area very clean and dry. Wash with a mild soap and water 2x/day, then dry area thoroughly before putting on clothes - you may use a hair dryer on cool/warm setting to ensure area is dry. Then apply a thin coat of clotrimazole cream to area - apply 2x/day.   Follow up with primary care doctor in 1 week if symptoms fail to improve/resolve.  Your urine tests today are normal, no blood is noted.

## 2017-12-20 NOTE — ED Notes (Signed)
Unable to sign signature pad not working; witnessed DC with Gillis Santa, RN

## 2017-12-21 ENCOUNTER — Telehealth: Payer: Self-pay | Admitting: Neurology

## 2017-12-21 NOTE — Telephone Encounter (Signed)
Patient was discharged from our practice. We sent out a letter to medical records to send to the patient. We received in a hand written addressed envelope to the patient a mail return. It was not in a cone envelope. We checked the address and mail it back to the patient in one of our [facility] envelopes.

## 2017-12-21 NOTE — Telephone Encounter (Signed)
Documentation regarding Dismissal from [facility]:  Mr. Beeck called our office several times and came to our office verbally aggressive making staff feel threatened. He expressed discontent with this physician and has decided to seek Neurological care at [facility]. Due to breakdown in physician-patient relationship with loss of trust in this physician, a dismissal letter has been sent to the patient.

## 2017-12-27 ENCOUNTER — Ambulatory Visit: Payer: Self-pay | Admitting: Neurology

## 2017-12-27 ENCOUNTER — Telehealth: Payer: Self-pay | Admitting: Neurology

## 2017-12-27 ENCOUNTER — Telehealth: Payer: Self-pay

## 2017-12-27 NOTE — Telephone Encounter (Signed)
error 

## 2017-12-27 NOTE — Telephone Encounter (Signed)
LVM on all three contact numbers to cancel patients appointment for today at 3:30. He will get a call to RS appointment if needed.

## 2017-12-29 NOTE — Telephone Encounter (Signed)
Spoke with patient and informed him that Dr. Jaynee Eagles reviewed his office notes from referring physician and feels she cannot do anything more for him. She ask that you follow up with your psychiatrist. He said ok then hung up. I called back and patient did not answer. Left voicemail to confirm that appointment was cancelled and he is to follow up with his psychiatrist.

## 2018-01-09 ENCOUNTER — Encounter (HOSPITAL_COMMUNITY): Payer: Self-pay

## 2018-01-09 ENCOUNTER — Emergency Department (HOSPITAL_COMMUNITY)
Admission: EM | Admit: 2018-01-09 | Discharge: 2018-01-09 | Disposition: A | Discharge status: Home / Self Care | Payer: Medicaid Other | Attending: Emergency Medicine | Admitting: Emergency Medicine

## 2018-01-09 DIAGNOSIS — R569 Unspecified convulsions: Secondary | ICD-10-CM | POA: Diagnosis not present

## 2018-01-09 DIAGNOSIS — Z79899 Other long term (current) drug therapy: Secondary | ICD-10-CM | POA: Insufficient documentation

## 2018-01-09 LAB — CBG MONITORING, ED: GLUCOSE-CAPILLARY: 80 mg/dL (ref 70–99)

## 2018-01-09 NOTE — ED Notes (Signed)
Patient left his room and walked down hallway. Called for patient to come back. Patient came back to sign AMA. Eulis Foster, MD not in office at this time. Will notify. MD.

## 2018-01-09 NOTE — ED Triage Notes (Signed)
Patient arrived via [facility] from home. Seizure at home for 3-5 min witnessed by patients care giver. bp-113/70, CBG-99,HR-66, Ra-98%. Patient refuses IV. Wife is on the way.

## 2018-01-09 NOTE — ED Provider Notes (Signed)
[facility] DEPT Provider Note   CSN: [id number] Arrival date & time: 01/09/18  1522     History   Chief Complaint Chief Complaint  Patient presents with  . Seizures    HPI Franklin [name] is a 37 y.o. male.  HPI   Patient states that he is here after having a seizure at home.  He reports lying on his bed when he had the seizure, and did not fall out.  He states his seizures seem to shake and stare into space.  There is no witness here now to explain what happened.  Patient denies headache, chest pain, nausea, vomiting, weakness or dizziness.  Has not had any bleeding from his mouth.  He denies recent fever, chills, change in bowel or urinary habits.  There are no other known modifying factors.  Past Medical History:  Diagnosis Date  . Bipolar 1 disorder ([place])   . Chronic headaches   . Conversion disorder with seizures or convulsions   . 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 Depression   . Hx of electroencephalogram 12/2014   normal  . Mild cognitive impairment   . Psychogenic nonepileptic seizure   . Schizophrenic disorder ([place])   . Seizures [facility])     Patient Active Problem List   Diagnosis Date Noted  . Psychogenic nonepileptic seizure 02/18/2017  . Cholelithiasis 01/24/2017  . 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 Abnormal EKG 11/30/2015  . Elevated troponin 11/30/2015  . Bipolar affective disorder, most recent episode unspecified type, remission status unspecified 03/11/2015  . Bipolar affective disorder ([place]) 01/08/2015  . Generalized idiopathic epilepsy and epileptic syndromes, without status epilepticus, not intractable ([place]) 11/27/2014  . Seizure disorder ([place]) 10/14/2014    History reviewed. No pertinent surgical history.      Home Medications    Prior to Admission medications   Medication Sig Start Date End Date  Taking? Authorizing Provider  cariprazine (VRAYLAR) capsule Take 1.5 mg by mouth at bedtime.   Yes [provider]  Oxcarbazepine (TRILEPTAL) 300 MG tablet Take 1/2 tablet twice a day Patient taking differently: Take 150 mg by mouth 2 (two) times daily.  08/23/17  Yes Cameron Sprang, MD  clotrimazole (LOTRIMIN) 1 % cream Apply to affected area 2 times daily Patient not taking: Reported on 01/09/2018 12/19/17   Joneen Caraway, Madelynn Done C, PA-C  clotrimazole (LOTRIMIN) 1 % cream Apply to affected area 2 times daily Patient not taking: Reported on 01/09/2018 12/20/17   Lajean Saver, MD    Family History Family History  Problem Relation Age of Onset  . Heart disease Mother     Social History Social History   Tobacco Use  . Smoking status: Never Smoker  . Smokeless tobacco: Never Used  Substance Use Topics  . Alcohol use: Not Currently    Alcohol/week: 0.0 standard drinks  . Drug use: No     Allergies   Coconut oil; Other; Codeine; Depakote [divalproex sodium]; Penicillins; and Tape   Review of Systems Review of Systems  All other systems reviewed and are negative.    Physical Exam Updated Vital Signs BP 134/80 (BP Location: Left Arm)   Pulse 83   Temp 98.8 F (37.1 C) (Oral)   Resp 17   SpO2 100%   Physical Exam  Constitutional: He is oriented to person, place, and time. He appears well-developed. No distress.  Slender, he appears older  than stated age.  HENT:  Head: Normocephalic and atraumatic.  Right Ear: External ear normal.  Left Ear: External ear normal.  No lingual, labial or oropharyngeal trauma.  He has poor dentition.  There are multiple teeth which have degraded, to the base.  There is no trismus.  Eyes: Pupils are equal, round, and reactive to light. Conjunctivae and EOM are normal.  Neck: Normal range of motion and phonation normal. Neck supple.  Cardiovascular: Normal rate, regular rhythm and normal heart sounds.  Pulmonary/Chest: Effort normal and  breath sounds normal. No respiratory distress. He exhibits no bony tenderness.  Abdominal: Soft. There is no tenderness.  Musculoskeletal: Normal range of motion.  Neurological: He is alert and oriented to person, place, and time. No cranial nerve deficit or sensory deficit. He exhibits normal muscle tone. Coordination normal.  There is no dysarthria, aphasia or nystagmus.  Skin: Skin is warm, dry and intact.  Psychiatric: He has a normal mood and affect. His behavior is normal. Judgment and thought content normal.  Nursing note and vitals reviewed.    ED Treatments / Results  Labs (all labs ordered are listed, but only abnormal results are displayed) Labs Reviewed  CBG MONITORING, ED    EKG None  Radiology No results found.  Procedures Procedures (including critical care time)  Medications Ordered in ED Medications - No data to display   Initial Impression / Assessment and Plan / ED Course  I have reviewed the triage vital signs and the nursing notes.  Pertinent labs & imaging results that were available during my care of the patient were reviewed by me and considered in my medical decision making (see chart for details).  Clinical Course as of Jan 09 1741  Mon Jan 09, 2018  1741 I was informed by nursing that the patient was seen walking out to the ED, he then signed AMA, and left.   [EW]    Clinical Course User Index [EW] Daleen Bo, MD     Patient Vitals for the past 24 hrs:  BP Temp Temp src Pulse Resp SpO2  01/09/18 1730 134/80 - - 83 17 100 %  01/09/18 1715 - - - (!) 44 15 98 %  01/09/18 1612 - - - 72 17 99 %  01/09/18 1535 124/89 98.8 F (37.1 C) Oral (!) 53 18 98 %  01/09/18 1531 - - - - - 98 %  01/09/18 1530 - - - - - 98 %    Medical Decision Making: Reported seizure.  Patient takes only Trileptal.  No clinical evidence for seizure or unstable metabolic condition.  Patient left AGAINST MEDICAL ADVICE prior to completing treatment  CRITICAL  CARE-no Performed by: Daleen Bo  Nursing Notes Reviewed/ Care Coordinated Applicable Imaging Reviewed Interpretation of Laboratory Data incorporated into ED treatment  Patient left AGAINST MEDICAL ADVICE   Final Clinical Impressions(s) / ED Diagnoses   Final diagnoses:  Seizure [facility])    ED Discharge Orders    None       Daleen Bo, MD 01/09/18 1747

## 2018-01-09 NOTE — ED Notes (Signed)
Bed: WA15 Expected date:  Expected time:  Means of arrival:  Comments: EMS- seizure 

## 2018-01-09 NOTE — ED Notes (Signed)
Seizure pads on bed and cardiac monitoring.

## 2018-01-10 ENCOUNTER — Emergency Department (HOSPITAL_COMMUNITY)
Admission: EM | Admit: 2018-01-10 | Discharge: 2018-01-10 | Disposition: A | Discharge status: Home / Self Care | Payer: Medicaid Other | Attending: Emergency Medicine | Admitting: Emergency Medicine

## 2018-01-10 DIAGNOSIS — F445 Conversion disorder with seizures or convulsions: Secondary | ICD-10-CM | POA: Diagnosis not present

## 2018-01-10 DIAGNOSIS — Z79899 Other long term (current) drug therapy: Secondary | ICD-10-CM | POA: Insufficient documentation

## 2018-01-10 DIAGNOSIS — R569 Unspecified convulsions: Secondary | ICD-10-CM

## 2018-01-10 LAB — CBG MONITORING, ED: GLUCOSE-CAPILLARY: 72 mg/dL (ref 70–99)

## 2018-01-10 MED ORDER — OXCARBAZEPINE 150 MG PO TABS
150.0000 mg | ORAL_TABLET | Freq: Once | ORAL | Status: AC
  Administered 2018-01-10: 150 mg via ORAL
  Filled 2018-01-10: qty 1

## 2018-01-10 NOTE — ED Provider Notes (Signed)
[facility] DEPT Provider Note   CSN: [id number] Arrival date & time: 01/10/18  1229     History   Chief Complaint Chief Complaint  Patient presents with  . Seizures    HPI Franklin [name] is a 37 y.o. male with a PMHx of nonepileptic psychogenic seizures, mild cognitive impairment, schizophrenia, chronic headaches, bipolar 1 disorder, and other conditions listed below, who presents to the ED with complaints of seizure.  Patient states that around 11 AM while they were on the way to getting food, he started feeling lightheaded and nauseated.  He was with someone else, and she states that he began to stare off into space for about 5 minutes, which is consistent with his prior seizures.  After that he was postictal for about 10 minutes before returning to baseline.  He states that he now has a mild headache.  He has not tried anything for symptoms, no known aggravating factors.  They deny any shaking, incontinence, or tongue biting.  He states that he took his Trileptal 150 mg yesterday morning but has not taken any since then, he is supposed to take 150 mg twice a day.  He reports that he had a seizure yesterday and came to the ED, however chart review reveals that he left AMA after he felt better and did not have any extensive evaluation.  He states that he sees Dr. Delice Lesch at [facility] neurology but that she "let him go" and told him to see someone else; chart review reveals that she saw him last on 08/23/17, explained to him that his seizures were psychogenic in nature and that he needed to f/up with psychiatry.  He states he's seen psychiatry and that he was placed on medications (unknown what) but that he hasn't noticed a difference.  He feels back to baseline now and has no other complaints.  He states that he has been sleeping well and denies alcohol use.  He denies any URI symptoms, cough, recent infection or illness, fevers, chills, CP, SOB, abd pain, N/V/D/C,  hematuria, dysuria, myalgias, arthralgias, numbness, tingling, focal weakness, LOC, or any other complaints at this time.   The history is provided by the patient and medical records. No language interpreter was used.    Past Medical History:  Diagnosis Date  . Bipolar 1 disorder ([place])   . Chronic headaches   . Conversion disorder with seizures or convulsions   . Convulsion, non-epileptic [facility])    "raises the possibility of non-epileptic events" per Neuro MD office note 03/2015  . Depression   . Hx of electroencephalogram 12/2014   normal  . Mild cognitive impairment   . Psychogenic nonepileptic seizure   . Schizophrenic disorder ([place])   . Seizures [facility])     Patient Active Problem List   Diagnosis Date Noted  . Psychogenic nonepileptic seizure 02/18/2017  . Cholelithiasis 01/24/2017  . 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 Abnormal EKG 11/30/2015  . Elevated troponin 11/30/2015  . Bipolar affective disorder, most recent episode unspecified type, remission status unspecified 03/11/2015  . Bipolar affective disorder ([place]) 01/08/2015  . Generalized idiopathic epilepsy and epileptic syndromes, without status epilepticus, not intractable ([place]) 11/27/2014  . Seizure disorder ([place]) 10/14/2014    No past surgical history on file.      Home Medications    Prior to Admission medications   Medication Sig Start Date End Date Taking? Authorizing Provider  cariprazine (VRAYLAR) capsule Take 1.5 mg by mouth at bedtime.  Yes [provider]  Oxcarbazepine (TRILEPTAL) 300 MG tablet Take 1/2 tablet twice a day Patient taking differently: Take 150 mg by mouth 2 (two) times daily.  08/23/17  Yes Cameron Sprang, MD  clotrimazole (LOTRIMIN) 1 % cream Apply to affected area 2 times daily Patient not taking: Reported on 01/10/2018 12/19/17   Joneen Caraway, Madelynn Done C, PA-C  clotrimazole (LOTRIMIN) 1 % cream Apply to affected area 2 times daily Patient not taking:  Reported on 01/09/2018 12/20/17   Lajean Saver, MD    Family History Family History  Problem Relation Age of Onset  . Heart disease Mother     Social History Social History   Tobacco Use  . Smoking status: Never Smoker  . Smokeless tobacco: Never Used  Substance Use Topics  . Alcohol use: Not Currently    Alcohol/week: 0.0 standard drinks  . Drug use: No     Allergies   Coconut oil; Other; Codeine; Depakote [divalproex sodium]; Penicillins; and Tape   Review of Systems Review of Systems  Constitutional: Negative for chills and fever.  HENT: Negative for rhinorrhea and sore throat.        No tongue biting  Respiratory: Negative for cough and shortness of breath.   Cardiovascular: Negative for chest pain.  Gastrointestinal: Positive for nausea. Negative for abdominal pain, constipation, diarrhea and vomiting.  Genitourinary: Negative for dysuria and hematuria.  Musculoskeletal: Negative for arthralgias and myalgias.  Skin: Negative for color change.  Allergic/Immunologic: Negative for immunocompromised state.  Neurological: Positive for seizures ("staring"), light-headedness and headaches. Negative for tremors, syncope, weakness and numbness.  Psychiatric/Behavioral: Positive for confusion (post-ictal x36mins, now back to baseline).   All other systems reviewed and are negative for acute change except as noted in the HPI.    Physical Exam Updated Vital Signs BP 125/69 (BP Location: Left Arm)   Pulse (!) 57   Temp 97.9 F (36.6 C) (Oral)   Resp 15   SpO2 98%   Physical Exam  Constitutional: He is oriented to person, place, and time. Vital signs are normal. He appears well-developed and well-nourished.  Non-toxic appearance. No distress.  Afebrile, nontoxic, NAD  HENT:  Head: Normocephalic and atraumatic.  Mouth/Throat: Oropharynx is clear and moist and mucous membranes are normal.  Eyes: Pupils are equal, round, and reactive to light. Conjunctivae and EOM are  normal. Right eye exhibits no discharge. Left eye exhibits no discharge.  PERRL, EOMI, no nystagmus  Neck: Normal range of motion. Neck supple. No spinous process tenderness and no muscular tenderness present. No neck rigidity. Normal range of motion present.  FROM intact without spinous process TTP, no bony stepoffs or deformities, no paraspinous muscle TTP or muscle spasms. No rigidity or meningeal signs. No bruising or swelling.   Cardiovascular: Regular rhythm, normal heart sounds and intact distal pulses. Bradycardia present. Exam reveals no gallop and no friction rub.  No murmur heard. HR 57-65 during exam, similar to all prior visits  Pulmonary/Chest: Effort normal and breath sounds normal. No respiratory distress. He has no decreased breath sounds. He has no wheezes. He has no rhonchi. He has no rales.  Abdominal: Soft. Normal appearance and bowel sounds are normal. He exhibits no distension. There is no tenderness. There is no rigidity, no rebound, no guarding, no CVA tenderness, no tenderness at McBurney's point and negative Murphy's sign.  Musculoskeletal: Normal range of motion.  MAE x4 Strength and sensation grossly intact in all extremities Distal pulses intact  Neurological: He is alert and  oriented to person, place, and time. He has normal strength. No cranial nerve deficit or sensory deficit. Coordination normal. GCS eye subscore is 4. GCS verbal subscore is 5. GCS motor subscore is 6.  CN 2-12 grossly intact A&O x4 GCS 15 Sensation and strength intact Coordination with finger-to-nose WNL Neg pronator drift   Skin: Skin is warm, dry and intact. No rash noted.  Psychiatric: He has a normal mood and affect.  Nursing note and vitals reviewed.    ED Treatments / Results  Labs (all labs ordered are listed, but only abnormal results are displayed) Labs Reviewed  CBG MONITORING, ED    EKG None  Radiology No results found.  Procedures Procedures (including critical  care time)  Medications Ordered in ED Medications  OXcarbazepine (TRILEPTAL) tablet 150 mg (150 mg Oral Given 01/10/18 1331)     Initial Impression / Assessment and Plan / ED Course  I have reviewed the triage vital signs and the nursing notes.  Pertinent labs & imaging results that were available during my care of the patient were reviewed by me and considered in my medical decision making (see chart for details).     37 y.o. male here with seizure like activity. Was seen yesterday and left AMA. Has hx of nonepileptic psychogenic seizures, was seen by Dr. Delice Lesch of [facility] in the past (most recently 08/23/17) and has been explained that his seizures are psychogenic in nature, he's on trileptal 150mg  BID for mood stabilization (rather than seizures), and has been told to f/up with psychiatry. Hasn't taken his trileptal in >24hrs. Reports feeling lightheaded and nauseated, then had 71mins of staring into space, then 38mins of postictal state per witness, then returned to baseline. Pt seems to remember some of the information, which is odd for a seizure; no shaking or tongue biting, no incontinence. No LOC. On exam, no focal neuro deficits, well appearing. He has no other physical/medical complaints. I suspect this is still nonepileptic psychogenic seizures, will check CBG but otherwise doubt need for further emergent work up. Will give dose of Trileptal here, advised that he must be compliant with this, advised adequate hydration/rest/PO intake and avoidance of EtOH, and f/up with psych and neurology for ongoing management of his condition. Will reassess once CBG done.   1:38 PM CBG 72. Pt given his Trileptal dose here and is tolerating PO well. I doubt need for further emergent work up at this time. Pt stable for d/c with previously outlined plan. I explained the diagnosis and have given explicit precautions to return to the ER including for any other new or worsening symptoms. The patient  understands and accepts the medical plan as it's been dictated and I have answered their questions. Discharge instructions concerning home care and prescriptions have been given. The patient is STABLE and is discharged to home in good condition.    Final Clinical Impressions(s) / ED Diagnoses   Final diagnoses:  Seizure-like activity [facility])  Psychogenic nonepileptic seizure    ED Discharge Orders    [address], [place], [place] 01/10/18 [place]    Drenda Freeze, MD 01/10/18 1535

## 2018-01-10 NOTE — Telephone Encounter (Signed)
Please see below and we may need to discuss thanks

## 2018-01-10 NOTE — ED Triage Notes (Signed)
Transported by Charisse Klinefelter from [facility]--witnessed focal seizure today, lasted approximately 1-2 min. Seen yesterday for same at this facility. Has not taken his seizure medication in 2 days. +post ictal state. VSS. AAO x 4 upon arrival.

## 2018-01-10 NOTE — Discharge Instructions (Signed)
Your seizures are likely psychogenic in nature, which is what your neurologist has said in the past. They want you to continue your Trileptal 150mg  twice daily (every 12 hours) and continue to see your psychiatrist for ongoing assistance with your seizure like activities. It's very important that you never miss a dose of your medication. Stay well hydrated, get plenty of rest, eat well balanced meals, and avoid alcohol. Follow up with your neurologist and your psychiatrist in 3-5 days for recheck of symptoms and ongoing management of your seizure like activity. Return to the ER for emergent changes or worsening symptoms.

## 2018-01-10 NOTE — ED Notes (Signed)
Bed: WA10 Expected date: 01/10/18 Expected time: 12:31 PM Means of arrival: Ambulance Comments: Seizure

## 2018-01-10 NOTE — Telephone Encounter (Signed)
The patient's wife "angie" called and asked to schedule the patient, I asked to speak with the patient because we do not have a DPR on file. I spoke with the patient and relayed the message below to him. He said his Psychiatrist told him he needed to come back here. I informed him that Dr. Jaynee Eagles said there was nothing more she could do and he said "dont even worry about it and hung up".

## 2018-01-11 NOTE — Telephone Encounter (Signed)
We have nothing more to offer patient, please ensure wife and patient understand they keep calling for appointment.

## 2018-01-24 ENCOUNTER — Emergency Department (HOSPITAL_COMMUNITY)
Admission: EM | Admit: 2018-01-24 | Discharge: 2018-01-24 | Disposition: A | Discharge status: Home / Self Care | Payer: Medicaid Other | Attending: Emergency Medicine | Admitting: Emergency Medicine

## 2018-01-24 ENCOUNTER — Encounter (HOSPITAL_COMMUNITY): Payer: Self-pay | Admitting: Emergency Medicine

## 2018-01-24 DIAGNOSIS — F319 Bipolar disorder, unspecified: Secondary | ICD-10-CM | POA: Diagnosis not present

## 2018-01-24 DIAGNOSIS — R569 Unspecified convulsions: Secondary | ICD-10-CM | POA: Diagnosis present

## 2018-01-24 DIAGNOSIS — Z79899 Other long term (current) drug therapy: Secondary | ICD-10-CM | POA: Diagnosis not present

## 2018-01-24 DIAGNOSIS — F209 Schizophrenia, unspecified: Secondary | ICD-10-CM | POA: Diagnosis not present

## 2018-01-24 LAB — CBC WITH DIFFERENTIAL/PLATELET
ABS IMMATURE GRANULOCYTES: 0 10*3/uL (ref 0.0–0.1)
BASOS ABS: 0.1 10*3/uL (ref 0.0–0.1)
BASOS PCT: 1 %
EOS ABS: 0 10*3/uL (ref 0.0–0.7)
Eosinophils Relative: 0 %
HCT: 43.2 % (ref 39.0–52.0)
Hemoglobin: 14.5 g/dL (ref 13.0–17.0)
IMMATURE GRANULOCYTES: 0 %
Lymphocytes Relative: 20 %
Lymphs Abs: 1.6 10*3/uL (ref 0.7–4.0)
MCH: 29.5 pg (ref 26.0–34.0)
MCHC: 33.6 g/dL (ref 30.0–36.0)
MCV: 88 fL (ref 78.0–100.0)
MONOS PCT: 6 %
Monocytes Absolute: 0.5 10*3/uL (ref 0.1–1.0)
NEUTROS ABS: 5.7 10*3/uL (ref 1.7–7.7)
NEUTROS PCT: 73 %
PLATELETS: 175 10*3/uL (ref 150–400)
RBC: 4.91 MIL/uL (ref 4.22–5.81)
RDW: 12 % (ref 11.5–15.5)
WBC: 7.8 10*3/uL (ref 4.0–10.5)

## 2018-01-24 LAB — BASIC METABOLIC PANEL
Anion gap: 6 (ref 5–15)
BUN: 10 mg/dL (ref 6–20)
CO2: 26 mmol/L (ref 22–32)
Calcium: 9.3 mg/dL (ref 8.9–10.3)
Chloride: 109 mmol/L (ref 98–111)
Creatinine, Ser: 0.96 mg/dL (ref 0.61–1.24)
GFR calc Af Amer: >60 mL/min (ref >60)
GFR calc non Af Amer: >60 mL/min (ref >60)
GLUCOSE: 90 mg/dL (ref 70–99)
POTASSIUM: 4 mmol/L (ref 3.5–5.1)
SODIUM: 141 mmol/L (ref 135–145)

## 2018-01-24 LAB — CBG MONITORING, ED: Glucose-Capillary: 77 mg/dL (ref 70–99)

## 2018-01-24 MED ORDER — MIDAZOLAM HCL 2 MG/2ML IJ SOLN
2.0000 mg | Freq: Once | INTRAMUSCULAR | Status: DC
  Filled 2018-01-24: qty 2

## 2018-01-24 MED ORDER — LORAZEPAM 2 MG/ML IJ SOLN: 1.0000 mg | Freq: Once | INTRAMUSCULAR | Status: DC

## 2018-01-24 MED ORDER — OXCARBAZEPINE 150 MG PO TABS
150.0000 mg | ORAL_TABLET | Freq: Once | ORAL | Status: AC
  Administered 2018-01-24: 150 mg via ORAL
  Filled 2018-01-24: qty 1

## 2018-01-24 MED ORDER — LORAZEPAM 2 MG/ML IJ SOLN: 2.0000 mg | Freq: Once | INTRAMUSCULAR | Status: DC

## 2018-01-24 MED ORDER — LORAZEPAM 2 MG/ML IJ SOLN
INTRAMUSCULAR | Status: AC
  Filled 2018-01-24: qty 1

## 2018-01-24 MED ORDER — LORAZEPAM 2 MG/ML IJ SOLN
1.0000 mg | Freq: Once | INTRAMUSCULAR | Status: AC
  Administered 2018-01-24: 1 mg via INTRAVENOUS

## 2018-01-24 NOTE — ED Notes (Addendum)
Pt had seizure like activity mild shaking and blank stair, I held arms up I the air and when I let go he held his arms in the air. I also put my hands close to his eyes and the pt blinked.  Vital signs were stable and no distressed was noticed.

## 2018-01-24 NOTE — ED Notes (Signed)
Pt having another episode of seizure -like activity; full body shaking, drooling; pt suctioned, seizure activity ceased after approximately 45 seconds; MD at bedside, blink to threat positive

## 2018-01-24 NOTE — ED Triage Notes (Signed)
Pt to ER reports girlfriend reports 2 episodes of seizure like activity. EMS reports pseudoseizure like activity, hx of same noted in chart. CBG 99. VS WNL.

## 2018-01-24 NOTE — ED Notes (Signed)
Witnessed seizure like activity - full body shaking, drooling; Suction set up, MD aware

## 2018-01-24 NOTE — ED Notes (Addendum)
This RN was told by Johney Frame EMT that pt  said he is leaving and is pulling off EKG leads.  When this RN ask pt what was wrong pt replied "F....this hospital"  "This hospital isn't worth a F..."  I explained to pt that he needed to sign AMA, that we were trying to take care of him and that he needed to stay.  Pt st's "I ain't signing shit"    Pt then walked out of ED without difficulty with family member.  Pt alert and oriented x's 4.

## 2018-01-24 NOTE — ED Notes (Addendum)
Pt not responding to questions or commands, blink to threat positive

## 2018-01-24 NOTE — ED Provider Notes (Signed)
[facility] EMERGENCY DEPARTMENT Provider Note   CSN: [id number] Arrival date & time:        History   Chief Complaint Chief Complaint  Patient presents with  . Seizures    HPI Franklin [name] is a 37 y.o. male.  The history is provided by the patient and the spouse.     Franklin [name] is a 37 y.o. male, with a history of psychogenic nonepileptic seizures, schizophrenia, normal EEG, and bipolar, presenting to the ED with seizure-like activity prior to arrival. Patient's significant other called EMS due to seizure-like activity consistent with previous episodes. Patient voices compliance with his Trileptal.  States he was "let go" from his neurologist, Dr. Amparo Bristol, practice.  This seizure-like activity tends to happen only when someone else is present to witness it.  Patient's wife states patient laid down on the floor and "started drooling and twitching slightly."  She helped him onto the bed and patient was able to provide some assistance during the episode.  Episode lasted for less than 5 minutes.  He immediately returned to his baseline.  Denies fever/chills, recent illness, oral trauma, chest pain, shortness of breath, headache, abdominal pain, visual deficits, neuro deficits, incontinence, or any other complaints.   Past Medical History:  Diagnosis Date  . Bipolar 1 disorder ([place])   . Chronic headaches   . Conversion disorder with seizures or convulsions   . Convulsion, non-epileptic [facility])    "raises the possibility of non-epileptic events" per Neuro MD office note 03/2015  . Depression   . Hx of electroencephalogram 12/2014   normal  . Mild cognitive impairment   . Psychogenic nonepileptic seizure   . Schizophrenic disorder ([place])   . Seizures [facility])     Patient Active Problem List   Diagnosis Date Noted  . Psychogenic nonepileptic seizure 02/18/2017  . Cholelithiasis 01/24/2017  . 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  Abnormal EKG 11/30/2015  . Elevated troponin 11/30/2015  . Bipolar affective disorder, most recent episode unspecified type, remission status unspecified 03/11/2015  . Bipolar affective disorder ([place]) 01/08/2015  . Generalized idiopathic epilepsy and epileptic syndromes, without status epilepticus, not intractable ([place]) 11/27/2014  . Seizure disorder ([place]) 10/14/2014    History reviewed. No pertinent surgical history.      Home Medications    Prior to Admission medications   Medication Sig Start Date End Date Taking? Authorizing Provider  cariprazine (VRAYLAR) capsule Take 1.5 mg by mouth at bedtime.    [provider]  clotrimazole (LOTRIMIN) 1 % cream Apply to affected area 2 times daily Patient not taking: Reported on 01/10/2018 12/19/17   Wieters, Madelynn Done C, PA-C  clotrimazole (LOTRIMIN) 1 % cream Apply to affected area 2 times daily Patient not taking: Reported on 01/09/2018 12/20/17   Lajean Saver, MD  Oxcarbazepine (TRILEPTAL) 300 MG tablet Take 1/2 tablet twice a day Patient taking differently: Take 150 mg by mouth 2 (two) times daily.  08/23/17   Cameron Sprang, MD    Family History Family History  Problem Relation Age of Onset  . Heart disease Mother     Social History Social History   Tobacco Use  . Smoking status: Never Smoker  . Smokeless tobacco: Never Used  Substance Use Topics  . Alcohol use: Not Currently    Alcohol/week: 0.0 standard drinks  . Drug use: No     Allergies   Coconut oil; Other; Codeine; Depakote [divalproex sodium]; Penicillins; and Tape   Review of  Systems Review of Systems  Constitutional: Negative for chills, diaphoresis and fever.  Eyes: Negative for visual disturbance.  Respiratory: Negative for shortness of breath.   Cardiovascular: Negative for chest pain.  Gastrointestinal: Negative for abdominal pain, diarrhea, nausea and vomiting.  Musculoskeletal: Negative for back pain and neck pain.  Neurological: Positive for  seizures (seizure-like activity). Negative for weakness, numbness and headaches.  All other systems reviewed and are negative.    Physical Exam Updated Vital Signs BP 116/74   Pulse (!) 57   Temp 98.3 F (36.8 C) (Oral)   Resp 13   SpO2 99%   Physical Exam  Constitutional: He is oriented to person, place, and time. He appears well-developed and well-nourished. No distress.  HENT:  Head: Normocephalic and atraumatic.  Mouth/Throat: Oropharynx is clear and moist.  No noted intraoral trauma.  Eyes: Pupils are equal, round, and reactive to light. Conjunctivae and EOM are normal.  Neck: Neck supple.  Cardiovascular: Normal rate, regular rhythm, normal heart sounds and intact distal pulses.  Pulmonary/Chest: Effort normal and breath sounds normal. No respiratory distress.  Abdominal: Soft. There is no tenderness. There is no guarding.  Genitourinary:  Genitourinary Comments: No evidence of urinary incontinence.  Musculoskeletal: He exhibits no edema.  Lymphadenopathy:    He has no cervical adenopathy.  Neurological: He is alert and oriented to person, place, and time.  Sensation grossly intact to light touch in the extremities. Strength 5/5 in all extremities. No gait disturbance. Coordination intact. Cranial nerves III-XII grossly intact. No facial droop.   Skin: Skin is warm and dry. He is not diaphoretic.  Psychiatric: He has a normal mood and affect. His behavior is normal.  Nursing note and vitals reviewed.    ED Treatments / Results  Labs (all labs ordered are listed, but only abnormal results are displayed) Labs Reviewed  BASIC METABOLIC PANEL  CBC WITH DIFFERENTIAL/PLATELET  URINALYSIS, ROUTINE W REFLEX MICROSCOPIC  CBG MONITORING, ED    EKG EKG Interpretation  Date/Time:  Tuesday January 24 2018 13:34:42 EDT Ventricular Rate:  55 PR Interval:    QRS Duration: 113 QT Interval:  401 QTC Calculation: 384 R Axis:   -27 Text Interpretation:  Sinus rhythm  Borderline intraventricular conduction delay ST elev, probable normal early repol pattern No significant change since last tracing Confirmed by Wandra Arthurs [phone]) on 01/24/2018 3:16:50 PM Also confirmed by Wandra Arthurs [phone]), editor Lynder Parents [phone])  on 01/24/2018 3:53:12 PM   Radiology No results found.  Procedures Procedures (including critical care time)  Medications Ordered in ED Medications  LORazepam (ATIVAN) injection 1 mg (1 mg Intravenous Given 01/24/18 1355)  OXcarbazepine (TRILEPTAL) tablet 150 mg (150 mg Oral Given 01/24/18 1548)     Initial Impression / Assessment and Plan / ED Course  I have reviewed the triage vital signs and the nursing notes.  Pertinent labs & imaging results that were available during my care of the patient were reviewed by me and considered in my medical decision making (see chart for details).  Clinical Course as of Jan 24 1629  Tue Jan 24, 2018  1555 Spoke with patient's wife, Janace Hoard, outside of the patient's room.  We discussed the patient's diagnosis of psychogenic seizures and what this means.  We discussed appropriate actions to take as well as psychiatry follow-up.  Voiced understanding.   [SJ]    Clinical Course User Index [SJ] Yohannes Waibel C, PA-C    Patient presents with seizure-like activity.  During his  ED course, he had at least 3 noted episodes of this activity.  They would only occur when there was another person present or when the patient was in a hallway bed.   He has no noted injuries on exam and has no additional complaints.  Lab results reassuring.  I discussed the patient's psychogenic seizure diagnosis with him, but it was after I explained the patient's disease to his wife that the patient became upset, pulled his leads off, and walked out cursing at the staff.  Findings and plan of care discussed with [name], MD.   Vitals:   01/24/18 1415 01/24/18 1430 01/24/18 1445 01/24/18 1530  BP: 127/79 117/70 116/74  117/73  Pulse: 65 (!) 55 (!) 57 (!) 53  Resp: 14 13 13 14   Temp:      TempSrc:      SpO2: 99% 99% 99% 100%    Final Clinical Impressions(s) / ED Diagnoses   Final diagnoses:  Seizure-like activity [facility])    ED Discharge Orders    None       Layla Maw 01/24/18 1630    Drenda Freeze, MD 01/24/18 2314

## 2018-01-25 NOTE — Progress Notes (Signed)
Patient ID: Franklin [name], male   DOB: [birth year], 37 y.o.   MRN: [id number]     Franklin [name], is a 38 y.o. male  HWE:[id number]  ELF:[id number]  DOB - 05-05-1981  Subjective:  Chief Complaint and HPI: Franklin [name] is a 37 y.o. male here today for a follow up visit After being seen in the ED for seizure 01/24/2018.  He says his seizures have never been controlled on trileptal.  He does not know which kinds of seizures he has.  He has not seen a neurologist in a long time.  He has no new complaints today.    From ED note: 37 y.o. male, with a history of psychogenic nonepileptic seizures, schizophrenia, normal EEG, and bipolar, presenting to the ED with seizure-like activity prior to arrival. Patient's significant other called EMS due to seizure-like activity consistent with previous episodes. Patient voices compliance with his Trileptal.  States he was "let go" from his neurologist, Dr. Amparo Bristol, practice.  This seizure-like activity tends to happen only when someone else is present to witness it.  Patient's wife states patient laid down on the floor and "started drooling and twitching slightly."  She helped him onto the bed and patient was able to provide some assistance during the episode.  Episode lasted for less than 5 minutes.  He immediately returned to his baseline.  Denies fever/chills, recent illness, oral trauma, chest pain, shortness of breath, headache, abdominal pain, visual deficits, neuro deficits, incontinence, or any other complaints.  From A/P:  Patient presents with seizure-like activity.  During his ED course, he had at least 3 noted episodes of this activity.  They would only occur when there was another person present or when the patient was in a hallway bed.   He has no noted injuries on exam and has no additional complaints.  Lab results reassuring.  I discussed the patient's psychogenic seizure diagnosis with him, but it was after I explained the patient's  disease to his wife that the patient became upset, pulled his leads off, and walked out cursing at the staff.  Findings and plan of care discussed with [name], MD.   ED/Hospital notes reviewed.  Labs were normal.    ROS:   Constitutional:  No f/c, No night sweats, No unexplained weight loss. EENT:  No vision changes, No blurry vision, No hearing changes. No mouth, throat, or ear problems.  Respiratory: No cough, No SOB Cardiac: No CP, no palpitations GI:  No abd pain, No N/V/D. GU: No Urinary s/sx Musculoskeletal: No joint pain Neuro: No headache, no dizziness, no motor weakness.  Skin: No rash Endocrine:  No polydipsia. No polyuria.  Psych: Denies SI/HI  No problems updated.  ALLERGIES: Allergies  Allergen Reactions  . Coconut Oil Anaphylaxis and Nausea And Vomiting    Reaction to any kind of coconut  . Other Anaphylaxis    Raisins  . Codeine Nausea And Vomiting  . Depakote [Divalproex Sodium] Other (See Comments)    Pt states that this medication makes him feel like he is out of it.   Marland Kitchen Penicillins Nausea And Vomiting    Has patient had a PCN reaction causing immediate rash, facial/tongue/throat swelling, SOB or lightheadedness with hypotension: No Has patient had a PCN reaction causing severe rash involving mucus membranes or skin necrosis: No Has patient had a PCN reaction that required hospitalization No Has patient had a PCN reaction occurring within the last 10 years: No If all of the above answers are "NO", then  may proceed with Cephalosporin use.    . Tape Other (See Comments)    Reaction:  Tears skin, Pt is able to use paper tape.      PAST MEDICAL HISTORY: Past Medical History:  Diagnosis Date  . Bipolar 1 disorder ([place])   . Chronic headaches   . Conversion disorder with seizures or convulsions   . 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 Depression   . Hx of  electroencephalogram 12/2014   normal  . Mild cognitive impairment   . Psychogenic nonepileptic seizure   . Schizophrenic disorder ([place])   . Seizures ([place])     MEDICATIONS AT HOME: Prior to Admission medications   Medication Sig Start Date End Date Taking? Authorizing Provider  cariprazine (VRAYLAR) capsule Take 1.5 mg by mouth at bedtime.   Yes [provider]  Oxcarbazepine (TRILEPTAL) 300 MG tablet Take 1/2 tablet twice a day 01/26/18  Yes Rosaleah Person, Dionne Bucy, PA-C     Objective:  EXAM:   Vitals:   01/26/18 1420  BP: 105/68  Pulse: 60  Resp: 18  Temp: 98.9 F (37.2 C)  TempSrc: Oral  SpO2: 100%  Weight: 168 lb (76.2 kg)  Height: 6' (1.829 m)    General appearance : A&OX3. NAD. Non-toxic-appearing HEENT: Atraumatic and Normocephalic.  PERRLA. EOM intact.   Chest/Lungs:  Breathing-non-labored, Good air entry bilaterally, breath sounds normal without rales, rhonchi, or wheezing  CVS: S1 S2 regular, no murmurs, gallops, rubs  Extremities: Bilateral Lower Ext shows no edema, both legs are warm to touch with = pulse throughout Neurology:  CN II-XII grossly intact, Non focal.   Psych:  TP linear. J/I fair. Normal speech. Occasional eye contact and blunted affect.  Skin:  No Rash  Data Review Lab Results  Component Value Date   HGBA1C 5.1 05/03/2016   HGBA1C 5.4 10/14/2014     Assessment & Plan   1. Seizure disorder (HCC) - Oxcarbazepine (TRILEPTAL) 300 MG tablet; Take 1/2 tablet twice a day  Dispense: 30 tablet; Refill: 11 - Ambulatory referral to Neurology  2. Bipolar I disorder (HCC) - Oxcarbazepine (TRILEPTAL) 300 MG tablet; Take 1/2 tablet twice a day  Dispense: 30 tablet; Refill: 11  3. Encounter for examination following treatment at hospital Stable  - Ambulatory referral to Neurology   Patient have been counseled extensively about nutrition and exercise  Return in about 6 weeks (around 03/09/2018) for Dr [name]; seizure.  The patient was  given clear instructions to go to ER or return to medical center if symptoms don't improve, worsen or new problems develop. The patient verbalized understanding. The patient was told to call to get lab results if they haven't heard anything in the next week.     Freeman Caldron, PA-C [facility] [place], [place]   01/26/2018, 2:42 PM

## 2018-01-26 ENCOUNTER — Ambulatory Visit: Payer: Medicaid Other | Attending: Family Medicine | Admitting: Physician Assistant

## 2018-01-26 VITALS — BP 105/68 | HR 60 | Temp 98.9°F | Resp 18 | Ht 72.0 in | Wt 168.0 lb

## 2018-01-26 DIAGNOSIS — Z885 Allergy status to narcotic agent status: Secondary | ICD-10-CM | POA: Diagnosis not present

## 2018-01-26 DIAGNOSIS — Z880 Allergy status to penicillin: Secondary | ICD-10-CM | POA: Diagnosis not present

## 2018-01-26 DIAGNOSIS — F319 Bipolar disorder, unspecified: Secondary | ICD-10-CM | POA: Diagnosis not present

## 2018-01-26 DIAGNOSIS — Z09 Encounter for follow-up examination after completed treatment for conditions other than malignant neoplasm: Secondary | ICD-10-CM

## 2018-01-26 DIAGNOSIS — Z79899 Other long term (current) drug therapy: Secondary | ICD-10-CM | POA: Insufficient documentation

## 2018-01-26 DIAGNOSIS — F209 Schizophrenia, unspecified: Secondary | ICD-10-CM | POA: Diagnosis not present

## 2018-01-26 DIAGNOSIS — G40909 Epilepsy, unspecified, not intractable, without status epilepticus: Secondary | ICD-10-CM | POA: Diagnosis present

## 2018-01-26 MED ORDER — OXCARBAZEPINE 300 MG PO TABS: ORAL_TABLET | ORAL | 11 refills | Status: DC

## 2018-01-27 ENCOUNTER — Encounter

## 2018-01-27 ENCOUNTER — Ambulatory Visit: Payer: Self-pay | Admitting: Neurology

## 2018-01-28 ENCOUNTER — Other Ambulatory Visit: Payer: Self-pay

## 2018-01-28 ENCOUNTER — Encounter (HOSPITAL_COMMUNITY): Payer: Self-pay

## 2018-01-28 ENCOUNTER — Emergency Department (HOSPITAL_COMMUNITY): Payer: Medicaid Other

## 2018-01-28 ENCOUNTER — Emergency Department (HOSPITAL_COMMUNITY)
Admission: EM | Admit: 2018-01-28 | Discharge: 2018-01-28 | Disposition: A | Discharge status: Home / Self Care | Payer: Medicaid Other | Attending: Emergency Medicine | Admitting: Emergency Medicine

## 2018-01-28 DIAGNOSIS — Z79899 Other long term (current) drug therapy: Secondary | ICD-10-CM | POA: Insufficient documentation

## 2018-01-28 DIAGNOSIS — R569 Unspecified convulsions: Secondary | ICD-10-CM | POA: Insufficient documentation

## 2018-01-28 LAB — DIFFERENTIAL
BASOS ABS: 0 10*3/uL (ref 0.0–0.1)
Basophils Relative: 0 %
Eosinophils Absolute: 0 10*3/uL (ref 0.0–0.7)
Eosinophils Relative: 1 %
LYMPHS ABS: 1.7 10*3/uL (ref 0.7–4.0)
LYMPHS PCT: 34 %
MONOS PCT: 7 %
Monocytes Absolute: 0.4 10*3/uL (ref 0.1–1.0)
NEUTROS ABS: 2.9 10*3/uL (ref 1.7–7.7)
NEUTROS PCT: 58 %

## 2018-01-28 LAB — CBC
HEMATOCRIT: 43.2 % (ref 39.0–52.0)
HEMOGLOBIN: 14.9 g/dL (ref 13.0–17.0)
MCH: 30 pg (ref 26.0–34.0)
MCHC: 34.5 g/dL (ref 30.0–36.0)
MCV: 87.1 fL (ref 78.0–100.0)
Platelets: 183 10*3/uL (ref 150–400)
RBC: 4.96 MIL/uL (ref 4.22–5.81)
RDW: 12.3 % (ref 11.5–15.5)
WBC: 5 10*3/uL (ref 4.0–10.5)

## 2018-01-28 LAB — COMPREHENSIVE METABOLIC PANEL
ALT: 19 U/L (ref 0–44)
AST: 21 U/L (ref 15–41)
Albumin: 4.2 g/dL (ref 3.5–5.0)
Alkaline Phosphatase: 57 U/L (ref 38–126)
Anion gap: 6 (ref 5–15)
BILIRUBIN TOTAL: 1.4 mg/dL — AB (ref 0.3–1.2)
BUN: 12 mg/dL (ref 6–20)
CO2: 27 mmol/L (ref 22–32)
Calcium: 9.5 mg/dL (ref 8.9–10.3)
Chloride: 108 mmol/L (ref 98–111)
Creatinine, Ser: 0.95 mg/dL (ref 0.61–1.24)
GFR calc Af Amer: >60 mL/min (ref >60)
Glucose, Bld: 86 mg/dL (ref 70–99)
Potassium: 4.1 mmol/L (ref 3.5–5.1)
Sodium: 141 mmol/L (ref 135–145)
Total Protein: 7.3 g/dL (ref 6.5–8.1)

## 2018-01-28 LAB — I-STAT TROPONIN, ED: Troponin i, poc: 0.01 ng/mL (ref 0.00–0.08)

## 2018-01-28 LAB — CBG MONITORING, ED: GLUCOSE-CAPILLARY: 80 mg/dL (ref 70–99)

## 2018-01-28 MED ORDER — SODIUM CHLORIDE 0.9 % IV BOLUS
500.0000 mL | Freq: Once | INTRAVENOUS | Status: AC
  Administered 2018-01-28: 500 mL via INTRAVENOUS

## 2018-01-28 NOTE — ED Notes (Signed)
Patient transported to CT 

## 2018-01-28 NOTE — ED Notes (Signed)
Bed: YV48 Expected date: 01/28/18 Expected time: 12:33 PM Means of arrival: Ambulance Comments: Fall, ? seizure

## 2018-01-28 NOTE — ED Provider Notes (Signed)
[facility] DEPT Provider Note  CSN: [id number] Arrival date & time: 01/28/18  1236  History   Chief Complaint Chief Complaint  Patient presents with  . Seizures  . Fall    HPI Franklin [name] is a 37 y.o. male with a medical history of seizures (documented psychogenic, normal EEG), MCI and bipolar disorder  who presented to the ED following a seizure. Patient states that he was walking outside when he began to feel lightheaded and dizziness and states that he fell and had a seizure. Patient states after he came to that he called his wife and told her that he had a seizure. This was not witnessed by anybody. He is able to describe what he is doing before and immediately after he had the seizure. No post-ictal state. Currently complains of neck pain and lightheadedness. Denies recent illness, chest pain, SOB, vision changes, paresthesias, weakness, slurred speech or facial droop.   Patient states that he currently does not have a neurologist because he was recently "let go." He states that his previous neurologist states that "he no longer needed a neurologist." Patient states that he is on Trileptal, but did not take his morning dose today. Patient reports having seizures frequently with the last one happening being 3 days which is supported by medical chart. He is unable to state any precipitating factors to his seizure and states that he is normally compliant with his medication.  Per medical chart, patient was dismissed from [facility] in 11/2017 as patient has presented to the office several times being verbally aggressive and threatening staff. Patient expressed a desire to follow with another neuro group.  Past Medical History:  Diagnosis Date  . Bipolar 1 disorder ([place])   . Chronic headaches   . Conversion disorder with seizures or convulsions   . 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 Depression   . Hx of electroencephalogram 12/2014   normal  . Mild cognitive impairment   . Psychogenic nonepileptic seizure   . Schizophrenic disorder ([place])   . Seizures [facility])     Patient Active Problem List   Diagnosis Date Noted  . Psychogenic nonepileptic seizure 02/18/2017  . Cholelithiasis 01/24/2017  . 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 Abnormal EKG 11/30/2015  . Elevated troponin 11/30/2015  . Bipolar affective disorder, most recent episode unspecified type, remission status unspecified 03/11/2015  . Bipolar affective disorder ([place]) 01/08/2015  . Generalized idiopathic epilepsy and epileptic syndromes, without status epilepticus, not intractable ([place]) 11/27/2014  . Seizure disorder ([place]) 10/14/2014    History reviewed. No pertinent surgical history.      Home Medications    Prior to Admission medications   Medication Sig Start Date End Date Taking? Authorizing Provider  cariprazine (VRAYLAR) capsule Take 1.5 mg by mouth daily.    Yes [provider]  Oxcarbazepine (TRILEPTAL) 300 MG tablet Take 1/2 tablet twice a day 01/26/18  Yes McClung, Dionne Bucy, PA-C    Family History Family History  Problem Relation Age of Onset  . Heart disease Mother     Social History Social History   Tobacco Use  . Smoking status: Never Smoker  . Smokeless tobacco: Never Used  Substance Use Topics  . Alcohol use: Not Currently    Alcohol/week: 0.0 standard drinks  . Drug use: No     Allergies   Coconut oil; Other; Codeine; Depakote [divalproex sodium];  Penicillins; and Tape   Review of Systems Review of Systems  Constitutional: Negative for activity change, appetite change and fever.  HENT: Negative.   Eyes: Negative.   Respiratory: Negative for chest tightness and shortness of breath.   Cardiovascular: Negative for chest pain.  Genitourinary: Negative.   Musculoskeletal: Positive for neck pain. Negative for back  pain.  Skin: Negative.   Neurological: Positive for dizziness, seizures and light-headedness. Negative for tremors, facial asymmetry, speech difficulty, weakness and numbness.  Hematological: Negative.   Psychiatric/Behavioral: Negative for confusion and decreased concentration.   Physical Exam Updated Vital Signs BP 127/80   Pulse (!) 51   Resp 17   Ht 6' (1.829 m)   Wt 76.4 kg   SpO2 100%   BMI 22.84 kg/m   Physical Exam  Constitutional: He is oriented to person, place, and time. Vital signs are normal. He appears well-developed and well-nourished. He is cooperative. Cervical collar in place.  HENT:  Head: Normocephalic and atraumatic.  Mouth/Throat: Uvula is midline, oropharynx is clear and moist and mucous membranes are normal. Abnormal dentition. Dental caries present.  Eyes: Pupils are equal, round, and reactive to light. Conjunctivae, EOM and lids are normal.  Neck: Spinous process tenderness and muscular tenderness present.  Neck ROM deferred to c-collar and endorsed midline tenderness.  Cardiovascular: Normal rate and regular rhythm.  No murmur heard. Pulmonary/Chest: Effort normal and breath sounds normal.  Neurological: He is alert and oriented to person, place, and time. He has normal strength. He displays no atrophy. No cranial nerve deficit or sensory deficit. He exhibits normal muscle tone. He displays no seizure activity. GCS eye subscore is 4. GCS verbal subscore is 5. GCS motor subscore is 6.  Reflex Scores:      Tricep reflexes are 2+ on the right side and 2+ on the left side.      Bicep reflexes are 2+ on the right side and 2+ on the left side.      Brachioradialis reflexes are 2+ on the right side and 2+ on the left side.      Patellar reflexes are 2+ on the right side and 2+ on the left side.      Achilles reflexes are 2+ on the right side and 2+ on the left side. Skin: Skin is warm and intact. No abrasion and no bruising noted.  Psychiatric: He has a normal  mood and affect. His speech is normal and behavior is normal. Thought content normal.  Nursing note and vitals reviewed.  ED Treatments / Results  Labs (all labs ordered are listed, but only abnormal results are displayed) Labs Reviewed  COMPREHENSIVE METABOLIC PANEL - Abnormal; Notable for the following components:      Result Value   Total Bilirubin 1.4 (*)    All other components within normal limits  CBC  DIFFERENTIAL  CBG MONITORING, ED  I-STAT TROPONIN, ED    EKG EKG Interpretation  Date/Time:  Saturday January 28 2018 12:51:25 EDT Ventricular Rate:  53 PR Interval:    QRS Duration: 125 QT Interval:  420 QTC Calculation: 395 R Axis:   -24 Text Interpretation:  Sinus rhythm Nonspecific intraventricular conduction delay ST elev, probable normal early repol pattern No significant change since last tracing Confirmed by Lacretia Leigh ([id number]) on 01/28/2018 1:03:52 PM   Radiology Ct Head Wo Contrast  Result Date: 01/28/2018 CLINICAL DATA:  Possible seizure today. Status post fall. Initial encounter. EXAM: CT HEAD WITHOUT CONTRAST CT CERVICAL SPINE WITHOUT CONTRAST TECHNIQUE:  Multidetector CT imaging of the head and cervical spine was performed following the standard protocol without intravenous contrast. Multiplanar CT image reconstructions of the cervical spine were also generated. COMPARISON:  Head and cervical spine CT scans 05/20/2017. FINDINGS: CT HEAD FINDINGS Brain: No evidence of acute infarction, hemorrhage, hydrocephalus, extra-axial collection or mass lesion/mass effect. Vascular: No hyperdense vessel or unexpected calcification. Skull: Normal. Negative for fracture or focal lesion. Sinuses/Orbits: Negative. Other: None. CT CERVICAL SPINE FINDINGS Alignment: Maintained with straightening of lordosis noted. Skull base and vertebrae: No acute fracture. No primary bone lesion or focal pathologic process. Soft tissues and spinal canal: No prevertebral fluid or swelling. No  visible canal hematoma. Disc levels: Intervertebral disc space height is normal. Mild facet degenerative disease C2-3 noted. Upper chest: Lung apices clear. Other: None. IMPRESSION: Negative head and cervical spine CT scans. Electronically Signed   By: Inge Rise M.D.   On: 01/28/2018 13:15   Ct Cervical Spine Wo Contrast  Result Date: 01/28/2018 CLINICAL DATA:  Possible seizure today. Status post fall. Initial encounter. EXAM: CT HEAD WITHOUT CONTRAST CT CERVICAL SPINE WITHOUT CONTRAST TECHNIQUE: Multidetector CT imaging of the head and cervical spine was performed following the standard protocol without intravenous contrast. Multiplanar CT image reconstructions of the cervical spine were also generated. COMPARISON:  Head and cervical spine CT scans 05/20/2017. FINDINGS: CT HEAD FINDINGS Brain: No evidence of acute infarction, hemorrhage, hydrocephalus, extra-axial collection or mass lesion/mass effect. Vascular: No hyperdense vessel or unexpected calcification. Skull: Normal. Negative for fracture or focal lesion. Sinuses/Orbits: Negative. Other: None. CT CERVICAL SPINE FINDINGS Alignment: Maintained with straightening of lordosis noted. Skull base and vertebrae: No acute fracture. No primary bone lesion or focal pathologic process. Soft tissues and spinal canal: No prevertebral fluid or swelling. No visible canal hematoma. Disc levels: Intervertebral disc space height is normal. Mild facet degenerative disease C2-3 noted. Upper chest: Lung apices clear. Other: None. IMPRESSION: Negative head and cervical spine CT scans. Electronically Signed   By: Inge Rise M.D.   On: 01/28/2018 13:15    Procedures Procedures (including critical care time)  Medications Ordered in ED Medications  sodium chloride 0.9 % bolus 500 mL (500 mLs Intravenous New Bag/Given 01/28/18 1424)     Initial Impression / Assessment and Plan / ED Course  Triage vital signs and the nursing notes have been  reviewed.  Pertinent labs & imaging results that were available during care of the patient were reviewed and considered in medical decision making (see chart for details).  Patient presents stating he had a seizure. History is suspicious for pseudoseizure as patient is able to recall specific details about what occurred before and immediately after the event. The event was unwitnessed and per EMS report there was no seizure activity or signs of seizure (including bowel/bladder incontinence or post-ictal state). Patient reports that this seizure occurred on pavement, but there are no signs of any injury from this such as abrasions or bruising. Physical exam is grossly unremarkable. Patient has no focal neuro deficits. He endorses midline c-spine tenderness and will get imaging to evaluate for acute injuries.  Clinical Course as of Jan 29 1448  Sat Jan 28, 2018  1304 EKG shows NSR. No significant ST elevation/depressions or signs of acute ischemia/infarct. Consistent with last EKG done 4 days ago on 01/24/18.   [GM]  1346 CT head and c-spine negative.   [GM]  38 Lab work normal.   [GM]    Clinical Course User Index [GM] Maury Dus  I, PA-C   Patient's work-up in the ED today is completely normal which is reassuring. Per medical record review, patient has had normal EEGs and seizure are likely psych in nature. Thorough education provided on follow-up with psychiatry and medication compliance.  Final Clinical Impressions(s) / ED Diagnoses  1. Pseudoseizure. Past documentation of normal EEG and psychogenic seizures. Today's event unwitnessed and history not consistent with seizure. Advised to follow-up with psychiatrist and to continue taking home medications as prescribed.   Dispo: Home. After thorough clinical evaluation, this patient is determined to be medically stable and can be safely discharged with the previously mentioned treatment and/or outpatient follow-up/referral(s). At this  time, there are no other apparent medical conditions that require further screening, evaluation or treatment.  Final diagnoses:  Seizure-like activity [facility])    ED Discharge Orders    None        Romie Jumper, [place] 01/28/18 1449    Valarie Merino, MD 01/28/18 [phone]

## 2018-01-28 NOTE — Discharge Instructions (Addendum)
Your work-up today was normal. No abnormalities were seen on the CT scans of your head or neck.   As we discussed, this seizure-like activity may be due to a combination of factors and may not be purely neurological (brain-related). I encourage you to discuss these episodes next time you go to [facility].  Continue to take your medications as they are prescribed to you.

## 2018-01-28 NOTE — ED Triage Notes (Signed)
Pt comes from home. Pt is AOx4. Pt was walking down street,pt stated that he fell and had seizure, called wife, pt wife showed up to scene and then called 911. EMS and [facility] do not report any seizure like activity, no urinary or bowel incontinence. Pt only complaint is neck pain from fall.  Pt did not take Keppra this morning.

## 2018-01-30 ENCOUNTER — Encounter (HOSPITAL_COMMUNITY): Payer: Self-pay | Admitting: Emergency Medicine

## 2018-01-30 ENCOUNTER — Emergency Department (HOSPITAL_COMMUNITY)
Admission: EM | Admit: 2018-01-30 | Discharge: 2018-01-30 | Disposition: A | Discharge status: Home / Self Care | Payer: Medicaid Other | Attending: Emergency Medicine | Admitting: Emergency Medicine

## 2018-01-30 DIAGNOSIS — E162 Hypoglycemia, unspecified: Secondary | ICD-10-CM

## 2018-01-30 DIAGNOSIS — Z91040 Latex allergy status: Secondary | ICD-10-CM | POA: Diagnosis not present

## 2018-01-30 DIAGNOSIS — R42 Dizziness and giddiness: Secondary | ICD-10-CM

## 2018-01-30 DIAGNOSIS — R0789 Other chest pain: Secondary | ICD-10-CM | POA: Insufficient documentation

## 2018-01-30 DIAGNOSIS — R51 Headache: Secondary | ICD-10-CM | POA: Insufficient documentation

## 2018-01-30 DIAGNOSIS — I951 Orthostatic hypotension: Secondary | ICD-10-CM | POA: Diagnosis not present

## 2018-01-30 DIAGNOSIS — Z79899 Other long term (current) drug therapy: Secondary | ICD-10-CM | POA: Diagnosis not present

## 2018-01-30 LAB — BASIC METABOLIC PANEL
Anion gap: 8 (ref 5–15)
BUN: 11 mg/dL (ref 6–20)
CALCIUM: 10 mg/dL (ref 8.9–10.3)
CO2: 25 mmol/L (ref 22–32)
CREATININE: 1.06 mg/dL (ref 0.61–1.24)
Chloride: 109 mmol/L (ref 98–111)
GFR calc Af Amer: >60 mL/min (ref >60)
Glucose, Bld: 96 mg/dL (ref 70–99)
Potassium: 3.8 mmol/L (ref 3.5–5.1)
SODIUM: 142 mmol/L (ref 135–145)

## 2018-01-30 LAB — CBC
HCT: 46.9 % (ref 39.0–52.0)
Hemoglobin: 15.9 g/dL (ref 13.0–17.0)
MCH: 30 pg (ref 26.0–34.0)
MCHC: 33.9 g/dL (ref 30.0–36.0)
MCV: 88.5 fL (ref 78.0–100.0)
PLATELETS: 195 10*3/uL (ref 150–400)
RBC: 5.3 MIL/uL (ref 4.22–5.81)
RDW: 11.9 % (ref 11.5–15.5)
WBC: 7.6 10*3/uL (ref 4.0–10.5)

## 2018-01-30 LAB — CBG MONITORING, ED
Glucose-Capillary: 96 mg/dL (ref 70–99)
Glucose-Capillary: 83 mg/dL (ref 70–99)

## 2018-01-30 MED ORDER — MECLIZINE HCL 25 MG PO TABS: 25.0000 mg | ORAL_TABLET | Freq: Two times a day (BID) | ORAL | 0 refills | Status: DC | PRN

## 2018-01-30 MED ORDER — SODIUM CHLORIDE 0.9 % IV BOLUS
1000.0000 mL | Freq: Once | INTRAVENOUS | Status: AC
  Administered 2018-01-30: 1000 mL via INTRAVENOUS

## 2018-01-30 MED ORDER — MECLIZINE HCL 25 MG PO TABS
25.0000 mg | ORAL_TABLET | Freq: Once | ORAL | Status: AC
  Administered 2018-01-30: 25 mg via ORAL
  Filled 2018-01-30: qty 1

## 2018-01-30 NOTE — ED Provider Notes (Signed)
[facility] EMERGENCY DEPARTMENT Provider Note   CSN: [id number] Arrival date & time: 01/30/18  1815     History   Chief Complaint Chief Complaint  Patient presents with  . Chest Pain  . Hypoglycemia    HPI Franklin [name] is a 37 y.o. male.  The history is provided by the patient.  Near Syncope  This is a new problem. The current episode started less than 1 hour ago. The problem occurs constantly. The problem has been gradually improving. Associated symptoms include headaches. Pertinent negatives include no chest pain, no abdominal pain and no shortness of breath. The symptoms are aggravated by walking, standing and twisting. The symptoms are relieved by lying down. Treatments tried: IV fluids. The treatment provided mild relief.    Past Medical History:  Diagnosis Date  . Bipolar 1 disorder ([place])   . Chronic headaches   . Conversion disorder with seizures or convulsions   . 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 Depression   . Hx of electroencephalogram 12/2014   normal  . Mild cognitive impairment   . Psychogenic nonepileptic seizure   . Schizophrenic disorder ([place])   . Seizures [facility])     Patient Active Problem List   Diagnosis Date Noted  . Psychogenic nonepileptic seizure 02/18/2017  . Cholelithiasis 01/24/2017  . 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 Abnormal EKG 11/30/2015  . Elevated troponin 11/30/2015  . Bipolar affective disorder, most recent episode unspecified type, remission status unspecified 03/11/2015  . Bipolar affective disorder ([place]) 01/08/2015  . Generalized idiopathic epilepsy and epileptic syndromes, without status epilepticus, not intractable ([place]) 11/27/2014  . Seizure disorder ([place]) 10/14/2014    History reviewed. No pertinent surgical history.      Home Medications    Prior to Admission medications   Medication  Sig Start Date End Date Taking? Authorizing Provider  acetaminophen (TYLENOL) 500 MG tablet Take 500-1,000 mg by mouth every 8 (eight) hours as needed (for pain or headaches).   Yes [provider]  cariprazine (VRAYLAR) capsule Take 1.5 mg by mouth daily.    Yes [provider]  Oxcarbazepine (TRILEPTAL) 300 MG tablet Take 1/2 tablet twice a day 01/26/18  Yes McClung, Angela M, PA-C  meclizine (ANTIVERT) 25 MG tablet Take 1 tablet (25 mg total) by mouth 2 (two) times daily as needed for dizziness. 01/30/18   Irven Baltimore, MD    Family History Family History  Problem Relation Age of Onset  . Heart disease Mother     Social History Social History   Tobacco Use  . Smoking status: Never Smoker  . Smokeless tobacco: Never Used  Substance Use Topics  . Alcohol use: Not Currently    Alcohol/week: 0.0 standard drinks  . Drug use: No     Allergies   Coconut oil; Other; Codeine; Depakote [divalproex sodium]; Penicillins; Tape; and Latex   Review of Systems Review of Systems  Constitutional: Positive for diaphoresis. Negative for chills and fever.  HENT: Negative for ear pain and sore throat.   Eyes: Negative for pain and visual disturbance.  Respiratory: Negative for cough and shortness of breath.   Cardiovascular: Positive for near-syncope. Negative for chest pain and palpitations.  Gastrointestinal: Negative for abdominal pain and vomiting.  Genitourinary: Negative for dysuria and hematuria.  Musculoskeletal: Negative for arthralgias and back pain.  Skin: Negative for color change and rash.  Neurological: Positive for  dizziness and headaches. Negative for seizures (PMH seizures, had one 3 days ago. None today) and syncope.  All other systems reviewed and are negative.    Physical Exam Updated Vital Signs BP 116/90   Pulse (!) 56   Temp 98.7 F (37.1 C) (Oral)   Resp 17   Ht 6' (1.829 m)   Wt 76.4 kg   SpO2 100%   BMI 22.84 kg/m   Physical Exam    Constitutional: He appears well-developed and well-nourished. No distress.  HENT:  Head: Normocephalic and atraumatic.  Eyes: Conjunctivae are normal.  Neck: Neck supple.  Cardiovascular: Normal rate, regular rhythm and intact distal pulses.  No murmur heard. Pulmonary/Chest: Effort normal and breath sounds normal. No respiratory distress.  Abdominal: Soft. There is no tenderness.  Musculoskeletal: He exhibits no edema.  5/5 strength with flexion and extension of bilateral upper and lower extremities. Intact sensation. Cranial nerves intact. Finger to nose intact. Rightward nystagmus with EOMs.  Neurological: He is alert.  Skin: Skin is warm. He is diaphoretic.  Psychiatric: He has a normal mood and affect.  Nursing note and vitals reviewed.    ED Treatments / Results  Labs (all labs ordered are listed, but only abnormal results are displayed) Labs Reviewed  CBC  BASIC METABOLIC PANEL  CBG MONITORING, ED  CBG MONITORING, ED    EKG EKG Interpretation  Date/Time:  Monday January 30 2018 18:16:27 EDT Ventricular Rate:  64 PR Interval:    QRS Duration: 120 QT Interval:  386 QTC Calculation: 399 R Axis:   3 Text Interpretation:  Sinus rhythm Nonspecific intraventricular conduction delay ST elev, probable normal early repol pattern No significant change since last tracing Confirmed by Duffy Bruce [phone]) on 01/30/2018 7:51:22 PM   Radiology No results found.  Procedures Procedures (including critical care time)  Medications Ordered in ED Medications  sodium chloride 0.9 % bolus 1,000 mL (0 mLs Intravenous Stopped 01/30/18 2136)  meclizine (ANTIVERT) tablet 25 mg (25 mg Oral Given 01/30/18 2009)     Initial Impression / Assessment and Plan / ED Course  I have reviewed the triage vital signs and the nursing notes.  Pertinent labs & imaging results that were available during my care of the patient were reviewed by me and considered in my medical decision making (see  chart for details).     The patient is a 37 year old man with past medical history of epilepsy who presents after feeling dizzy while walking back from the bus stop.  The patient reports that he is ambulating normally, and all of a sudden felt diaphoretic, dizzy, and weak.  The patient sat down and called EMS.  On EMS arrival, the patient had blood glucose of 60 and had orthostatic hypotension.  Patient was given normal saline and D50.  The patient reports feeling improved, however her symptoms have not resolved.  Patient reports symptoms are worse when he stands up and looks left or right.  Physical exam significant for diaphoresis and unidirectional nystagmus.  The patient given 1 L of normal saline, meclizine, and reevaluated.  Symptoms have resolved.  Patient is able to ambulate with no instability.  Orthostatics normal.  Repeat blood glucose remains normal in the ED.  Tolerated p.o.  Patient given return precautions and instructed to follow-up with his primary care provider.  He was also instructed to establish care with a neurologist and given resources to do so.    Patient care supervised by Dr. Ellender Hose.  Irven Baltimore, MD  Final Clinical Impressions(s) / ED Diagnoses   Final diagnoses:  Hypoglycemia  Orthostatic hypotension  Vertigo    ED Discharge Orders         Ordered    meclizine (ANTIVERT) 25 MG tablet  2 times daily PRN     01/30/18 2131           Irven Baltimore, MD 01/31/18 0105    Duffy Bruce, MD 02/02/18 [phone]

## 2018-01-30 NOTE — ED Triage Notes (Signed)
[place] ED via [facility] [place]-- was found on ground, hypoglycemic, and hypotensive with standing- after receiving oral glucose for CBG of 62 pt c/o chest pain. EKG was sent to ED, code stemi called.  On arrival- pt is pain free at present- diaphoretic, has IV, also received ASA 324mg  po.

## 2018-01-30 NOTE — ED Notes (Signed)
Pt ambulated down hall without difficulty.  Denies SOB or dizziness.

## 2018-01-30 NOTE — ED Notes (Signed)
Pt stable, ambulatory, states understanding of discharge instructions 

## 2018-01-31 ENCOUNTER — Encounter (HOSPITAL_COMMUNITY): Payer: Self-pay | Admitting: Emergency Medicine

## 2018-01-31 ENCOUNTER — Emergency Department (HOSPITAL_COMMUNITY)
Admission: EM | Admit: 2018-01-31 | Discharge: 2018-01-31 | Disposition: A | Discharge status: Home / Self Care | Payer: Medicaid Other | Attending: Emergency Medicine | Admitting: Emergency Medicine

## 2018-01-31 DIAGNOSIS — R451 Restlessness and agitation: Secondary | ICD-10-CM | POA: Diagnosis present

## 2018-01-31 DIAGNOSIS — F209 Schizophrenia, unspecified: Secondary | ICD-10-CM | POA: Insufficient documentation

## 2018-01-31 DIAGNOSIS — F329 Major depressive disorder, single episode, unspecified: Secondary | ICD-10-CM | POA: Diagnosis not present

## 2018-01-31 DIAGNOSIS — Z91040 Latex allergy status: Secondary | ICD-10-CM | POA: Diagnosis not present

## 2018-01-31 DIAGNOSIS — Z79899 Other long term (current) drug therapy: Secondary | ICD-10-CM | POA: Diagnosis not present

## 2018-01-31 DIAGNOSIS — F319 Bipolar disorder, unspecified: Secondary | ICD-10-CM | POA: Insufficient documentation

## 2018-01-31 NOTE — ED Notes (Addendum)
Pt refused vital signs while here. EMS vitals (BP-124/78, P-80, CBG-110)

## 2018-01-31 NOTE — ED Provider Notes (Signed)
[facility] EMERGENCY DEPARTMENT Provider Note   CSN: [id number] Arrival date & time: 01/31/18  2018     History   Chief Complaint Chief Complaint  Patient presents with  . Altered Mental Status    HPI Franklin [name] is a 37 y.o. male.  Patient is a 37 year old male with a history of bipolar disorder who presents via EMS with agitation.  Reportedly he was very agitated on scene and PD was called.  He was given Haldol 5 mg by EMS and has calmed down since that time.  Patient denies any hallucinations.  He states that he wants to leave and does not currently have any complaints.  He states he felt a little dizzy earlier today but denies any current dizziness.  He denies any suicidal or homicidal ideations.  His wife is at bedside and states that he gets like this sometimes but currently he is back to baseline.     Past Medical History:  Diagnosis Date  . Bipolar 1 disorder ([place])   . Chronic headaches   . Conversion disorder with seizures or convulsions   . 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 Depression   . Hx of electroencephalogram 12/2014   normal  . Mild cognitive impairment   . Psychogenic nonepileptic seizure   . Schizophrenic disorder ([place])   . Seizures [facility])     Patient Active Problem List   Diagnosis Date Noted  . Psychogenic nonepileptic seizure 02/18/2017  . Cholelithiasis 01/24/2017  . 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 Abnormal EKG 11/30/2015  . Elevated troponin 11/30/2015  . Bipolar affective disorder, most recent episode unspecified type, remission status unspecified 03/11/2015  . Bipolar affective disorder ([place]) 01/08/2015  . Generalized idiopathic epilepsy and epileptic syndromes, without status epilepticus, not intractable ([place]) 11/27/2014  . Seizure disorder ([place]) 10/14/2014    No past surgical history on  file.      Home Medications    Prior to Admission medications   Medication Sig Start Date End Date Taking? Authorizing Provider  acetaminophen (TYLENOL) 500 MG tablet Take 500-1,000 mg by mouth every 8 (eight) hours as needed (for pain or headaches).    [provider]  cariprazine (VRAYLAR) capsule Take 1.5 mg by mouth daily.     [provider]  meclizine (ANTIVERT) 25 MG tablet Take 1 tablet (25 mg total) by mouth 2 (two) times daily as needed for dizziness. 01/30/18   Irven Baltimore, MD  Oxcarbazepine (TRILEPTAL) 300 MG tablet Take 1/2 tablet twice a day 01/26/18   Argentina Donovan, PA-C    Family History Family History  Problem Relation Age of Onset  . Heart disease Mother     Social History Social History   Tobacco Use  . Smoking status: Never Smoker  . Smokeless tobacco: Never Used  Substance Use Topics  . Alcohol use: Not Currently    Alcohol/week: 0.0 standard drinks  . Drug use: No     Allergies   Coconut oil; Other; Codeine; Depakote [divalproex sodium]; Penicillins; Tape; and Latex   Review of Systems Review of Systems  Constitutional: Negative for chills, diaphoresis, fatigue and fever.  HENT: Negative for congestion, rhinorrhea and sneezing.   Eyes: Negative.   Respiratory: Negative for cough, chest tightness and shortness of breath.   Cardiovascular: Negative for chest pain and leg swelling.  Gastrointestinal: Negative for abdominal pain, blood in stool, diarrhea, nausea  and vomiting.  Genitourinary: Negative for difficulty urinating, flank pain, frequency and hematuria.  Musculoskeletal: Negative for arthralgias and back pain.  Skin: Negative for rash.  Neurological: Negative for dizziness, speech difficulty, weakness, numbness and headaches.  Psychiatric/Behavioral: Positive for behavioral problems. Negative for hallucinations and suicidal ideas. The patient is nervous/anxious.      Physical Exam Updated Vital Signs There were  no vitals taken for this visit.  Physical Exam  Constitutional: He is oriented to person, place, and time. He appears well-developed and well-nourished.  HENT:  Head: Normocephalic and atraumatic.  Eyes: Pupils are equal, round, and reactive to light.  Neck: Normal range of motion. Neck supple.  Cardiovascular: Normal rate, regular rhythm and normal heart sounds.  Pulmonary/Chest: Effort normal and breath sounds normal. No respiratory distress. He has no wheezes. He has no rales. He exhibits no tenderness.  Abdominal: Soft. Bowel sounds are normal. There is no tenderness. There is no rebound and no guarding.  Musculoskeletal: Normal range of motion. He exhibits no edema.  Lymphadenopathy:    He has no cervical adenopathy.  Neurological: He is alert and oriented to person, place, and time.  Skin: Skin is warm and dry. No rash noted.  Psychiatric: He has a normal mood and affect.  Patient has a normal affect.  He does not appear agitated.  He is answering questions appropriately.  He does not appear to have hallucinations.     ED Treatments / Results  Labs (all labs ordered are listed, but only abnormal results are displayed) Labs Reviewed - No data to display  EKG None  Radiology No results found.  Procedures Procedures (including critical care time)  Medications Ordered in ED Medications - No data to display   Initial Impression / Assessment and Plan / ED Course  I have reviewed the triage vital signs and the nursing notes.  Pertinent labs & imaging results that were available during my care of the patient were reviewed by me and considered in my medical decision making (see chart for details).     Patient is completely alert and oriented.  He is adamant about leaving.  His wife is at bedside and states that she is comfortable taking him home.  She states that he is at his normal mental status.  Final Clinical Impressions(s) / ED Diagnoses   Final diagnoses:   Agitation    ED Discharge Orders    None       Malvin Johns, MD 01/31/18 2038

## 2018-01-31 NOTE — ED Triage Notes (Signed)
Pt arrived EMS with complaints of confusion at home. Pt given 5mg  haldol in route due to agitation. Pt alert and oriented at this time. Pt requesting to leave. Dr. Tamera Punt notified

## 2018-02-01 ENCOUNTER — Emergency Department (HOSPITAL_COMMUNITY)
Admission: EM | Admit: 2018-02-01 | Discharge: 2018-02-01 | Disposition: A | Discharge status: Home / Self Care | Payer: Medicaid Other | Attending: Emergency Medicine | Admitting: Emergency Medicine

## 2018-02-01 ENCOUNTER — Encounter (HOSPITAL_COMMUNITY): Payer: Self-pay | Admitting: Emergency Medicine

## 2018-02-01 DIAGNOSIS — Z760 Encounter for issue of repeat prescription: Secondary | ICD-10-CM | POA: Diagnosis not present

## 2018-02-01 DIAGNOSIS — Z91040 Latex allergy status: Secondary | ICD-10-CM | POA: Diagnosis not present

## 2018-02-01 DIAGNOSIS — Z79899 Other long term (current) drug therapy: Secondary | ICD-10-CM | POA: Diagnosis not present

## 2018-02-01 DIAGNOSIS — R42 Dizziness and giddiness: Secondary | ICD-10-CM | POA: Insufficient documentation

## 2018-02-01 MED ORDER — MECLIZINE HCL 25 MG PO TABS: 25.0000 mg | ORAL_TABLET | Freq: Two times a day (BID) | ORAL | 0 refills | Status: DC | PRN

## 2018-02-01 NOTE — ED Notes (Signed)
PT states understanding of care given, follow up care, and medication prescribed. PT ambulated from ED to car with a steady gait. 

## 2018-02-01 NOTE — ED Triage Notes (Signed)
Pt reports dizziness.  No neuro deficits, denies n/v/d, sob or chest pain.

## 2018-02-01 NOTE — ED Provider Notes (Signed)
[facility] EMERGENCY DEPARTMENT Provider Note   CSN: [id number] Arrival date & time: 02/01/18  0315     History   Chief Complaint Chief Complaint  Patient presents with  . Dizziness    HPI Franklin [name] is a 37 y.o. male.  HPI Patient is a 37 year old male who presents to the emergency department requesting a refill of his meclizine.  He states he was recently prescribed meclizine for dizziness and states that he lost the prescription and all he wants is a new prescription.  He states his dizziness is better right now.  Denies unilateral arm or leg weakness.  No headache.  He does not want any additional work-up or evaluation just a refill of his medication.   Past Medical History:  Diagnosis Date  . Bipolar 1 disorder ([place])   . Chronic headaches   . Conversion disorder with seizures or convulsions   . 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 Depression   . Hx of electroencephalogram 12/2014   normal  . Mild cognitive impairment   . Psychogenic nonepileptic seizure   . Schizophrenic disorder ([place])   . Seizures [facility])     Patient Active Problem List   Diagnosis Date Noted  . Psychogenic nonepileptic seizure 02/18/2017  . Cholelithiasis 01/24/2017  . 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 Abnormal EKG 11/30/2015  . Elevated troponin 11/30/2015  . Bipolar affective disorder, most recent episode unspecified type, remission status unspecified 03/11/2015  . Bipolar affective disorder ([place]) 01/08/2015  . Generalized idiopathic epilepsy and epileptic syndromes, without status epilepticus, not intractable ([place]) 11/27/2014  . Seizure disorder ([place]) 10/14/2014    History reviewed. No pertinent surgical history.      Home Medications    Prior to Admission medications   Medication Sig Start Date End Date Taking? Authorizing Provider  acetaminophen  (TYLENOL) 500 MG tablet Take 500-1,000 mg by mouth every 8 (eight) hours as needed (for pain or headaches).    [provider]  cariprazine (VRAYLAR) capsule Take 1.5 mg by mouth daily.     [provider]  meclizine (ANTIVERT) 25 MG tablet Take 1 tablet (25 mg total) by mouth 2 (two) times daily as needed for dizziness. 01/30/18   Irven Baltimore, MD  Oxcarbazepine (TRILEPTAL) 300 MG tablet Take 1/2 tablet twice a day 01/26/18   Argentina Donovan, PA-C    Family History Family History  Problem Relation Age of Onset  . Heart disease Mother     Social History Social History   Tobacco Use  . Smoking status: Never Smoker  . Smokeless tobacco: Never Used  Substance Use Topics  . Alcohol use: Not Currently    Alcohol/week: 0.0 standard drinks  . Drug use: No     Allergies   Coconut oil; Other; Codeine; Depakote [divalproex sodium]; Penicillins; Tape; and Latex   Review of Systems Review of Systems  All other systems reviewed and are negative.    Physical Exam Updated Vital Signs BP 122/86 (BP Location: Right Arm)   Pulse 74   Temp 98.4 F (36.9 C) (Oral)   Resp 16   SpO2 98%   Physical Exam  Constitutional: He is oriented to person, place, and time. He appears well-developed and well-nourished.  HENT:  Head: Normocephalic and atraumatic.  Eyes: EOM are normal.  Neck: Normal range of motion.  Cardiovascular: Normal rate, regular rhythm, normal heart sounds and  intact distal pulses.  Pulmonary/Chest: Effort normal and breath sounds normal. No respiratory distress.  Abdominal: Soft. He exhibits no distension. There is no tenderness.  Musculoskeletal: Normal range of motion.  Neurological: He is alert and oriented to person, place, and time.  Skin: Skin is warm and dry.  Psychiatric: He has a normal mood and affect. Judgment normal.  Nursing note and vitals reviewed.    ED Treatments / Results  Labs (all labs ordered are listed, but only abnormal  results are displayed) Labs Reviewed - No data to display  EKG None  Radiology No results found.  Procedures Procedures (including critical care time)  Medications Ordered in ED Medications - No data to display   Initial Impression / Assessment and Plan / ED Course  I have reviewed the triage vital signs and the nursing notes.  Pertinent labs & imaging results that were available during my care of the patient were reviewed by me and considered in my medical decision making (see chart for details).     Patient is overall well-appearing.  Refill for the meclizine given.  Primary care follow-up.  Final Clinical Impressions(s) / ED Diagnoses   Final diagnoses:  Medication refill    ED Discharge Orders    None       Jola Schmidt, MD 02/01/18 [phone]

## 2018-02-21 ENCOUNTER — Emergency Department (HOSPITAL_COMMUNITY)
Admission: EM | Admit: 2018-02-21 | Discharge: 2018-02-21 | Discharge status: Against Medical Advice | Payer: Medicaid Other | Attending: Emergency Medicine | Admitting: Emergency Medicine

## 2018-02-21 ENCOUNTER — Encounter (HOSPITAL_COMMUNITY): Payer: Self-pay | Admitting: *Deleted

## 2018-02-21 DIAGNOSIS — Z5321 Procedure and treatment not carried out due to patient leaving prior to being seen by health care provider: Secondary | ICD-10-CM | POA: Insufficient documentation

## 2018-02-21 NOTE — ED Triage Notes (Signed)
Pt bib EMS and reports not feeling well since yesterday.  Pt alert and ambulatory. Pt denies any pain.

## 2018-02-21 NOTE — ED Notes (Signed)
Pt called for treatment room x 1, no answer. 

## 2018-02-21 NOTE — ED Notes (Signed)
Pt called for treatment room x 2, no answer.

## 2018-02-22 ENCOUNTER — Ambulatory Visit: Payer: Self-pay | Admitting: Neurology

## 2018-02-22 ENCOUNTER — Other Ambulatory Visit: Payer: Self-pay

## 2018-02-22 ENCOUNTER — Emergency Department (HOSPITAL_COMMUNITY)
Admission: EM | Admit: 2018-02-22 | Discharge: 2018-02-22 | Disposition: A | Discharge status: Home / Self Care | Payer: Medicaid Other | Attending: Emergency Medicine | Admitting: Emergency Medicine

## 2018-02-22 ENCOUNTER — Encounter (HOSPITAL_COMMUNITY): Payer: Self-pay | Admitting: Family Medicine

## 2018-02-22 ENCOUNTER — Encounter

## 2018-02-22 DIAGNOSIS — Z79899 Other long term (current) drug therapy: Secondary | ICD-10-CM | POA: Diagnosis not present

## 2018-02-22 DIAGNOSIS — R42 Dizziness and giddiness: Secondary | ICD-10-CM | POA: Insufficient documentation

## 2018-02-22 DIAGNOSIS — F445 Conversion disorder with seizures or convulsions: Secondary | ICD-10-CM | POA: Diagnosis not present

## 2018-02-22 DIAGNOSIS — R569 Unspecified convulsions: Secondary | ICD-10-CM | POA: Diagnosis present

## 2018-02-22 LAB — CBG MONITORING, ED: GLUCOSE-CAPILLARY: 70 mg/dL (ref 70–99)

## 2018-02-22 MED ORDER — MECLIZINE HCL 25 MG PO TABS: 25.0000 mg | ORAL_TABLET | Freq: Three times a day (TID) | ORAL | 0 refills | Status: DC | PRN

## 2018-02-22 NOTE — Discharge Instructions (Addendum)
1.  Follow-up with your family doctor and address your concerns regarding ongoing seizures and possible referral to a new neurologist for ongoing care and second opinion. 2.  Try to manage your symptoms as a team effort.  You will need your family doctor, psychiatrist and neurologist all working together with your assistance for best management of pseudoseizure. 3.  You have been describing spinning dizziness for a number of prior visits.  This sounds like vertigo.  You have been given a prescription for meclizine.  Go to the pharmacist and see if it is less expensive for you to fill this medication as a generic for by it as an over-the-counter medication.

## 2018-02-22 NOTE — ED Triage Notes (Signed)
Patient is from home and transported via [facility]. Patient has had 2 witnessed seizures with in 20 minutes apart, lasting 3 minutes. Patient was post-ictal on arrival of EMS. Patient reported to EMS he has been taking his Trileptal as prescribed but not seen a neurologist for medication change. Also, patient was here yesterday for seizures and reported having one on Monday as well.

## 2018-02-22 NOTE — ED Provider Notes (Signed)
[facility] DEPT Provider Note   CSN: [id number] Arrival date & time: 02/22/18  1549     History   Chief Complaint Chief Complaint  Patient presents with  . Seizures    HPI Franklin [name] is a 37 y.o. male.  HPI Patient had 2 seizures today at home.  His wife observed 1 of them.  They lasted about 3 minutes.  There was typical shaking and staring.  No associated injury.  Patient reports he is also been having dizziness.  It is spinning quality as everything moving around him.  It comes and goes.  It is not particularly worsened by standing.  He reports he can be at rest when he is doing nothing.  No syncopal episode.  Patient has been prescribed meclizine but reports he has never actually filled the prescription citing financial reasons.  Patient expresses concerns for not getting the appropriate treatment from his neurologist.  He reports he thinks he needs his medication changed.  He and his wife are frustrated because he keeps having seizures but they report everyone keeps telling them there is nothing actually wrong.  He does have a psychiatrist.  He reports he is actually seeing another psychiatric provider this week.  They plan to address some of these concerns and the diagnosis of his seizures being psychiatric in etiology. Past Medical History:  Diagnosis Date  . Bipolar 1 disorder ([place])   . Chronic headaches   . Conversion disorder with seizures or convulsions   . 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 Depression   . Hx of electroencephalogram 12/2014   normal  . Mild cognitive impairment   . Psychogenic nonepileptic seizure   . Schizophrenic disorder ([place])   . Seizures [facility])     Patient Active Problem List   Diagnosis Date Noted  . Psychogenic nonepileptic seizure 02/18/2017  . Cholelithiasis 01/24/2017  . Conversion disorder with seizures or convulsions  03/26/2016  . Abnormal EKG 11/30/2015  . Elevated troponin 11/30/2015  . Bipolar affective disorder, most recent episode unspecified type, remission status unspecified 03/11/2015  . Bipolar affective disorder ([place]) 01/08/2015  . Generalized idiopathic epilepsy and epileptic syndromes, without status epilepticus, not intractable ([place]) 11/27/2014  . Seizure disorder ([place]) 10/14/2014    History reviewed. No pertinent surgical history.      Home Medications    Prior to Admission medications   Medication Sig Start Date End Date Taking? Authorizing Provider  acetaminophen (TYLENOL) 500 MG tablet Take 500-1,000 mg by mouth every 8 (eight) hours as needed (for pain or headaches).   Yes [provider]  cariprazine (VRAYLAR) capsule Take 1.5 mg by mouth daily.    Yes [provider]  Oxcarbazepine (TRILEPTAL) 300 MG tablet Take 1/2 tablet twice a day 01/26/18  Yes McClung, Angela M, PA-C  meclizine (ANTIVERT) 25 MG tablet Take 1 tablet (25 mg total) by mouth 2 (two) times daily as needed for dizziness. Patient not taking: Reported on 02/22/2018 02/01/18   Jola Schmidt, MD  meclizine (ANTIVERT) 25 MG tablet Take 1 tablet (25 mg total) by mouth 3 (three) times daily as needed for dizziness. 02/22/18   Charlesetta Shanks, MD    Family History Family History  Problem Relation Age of Onset  . Heart disease Mother     Social History Social History   Tobacco Use  . Smoking status: Never Smoker  . Smokeless tobacco: Never Used  Substance Use Topics  . Alcohol use: Not Currently    Alcohol/week: 0.0 standard drinks  . Drug use: No     Allergies   Coconut oil; Other; Codeine; Depakote [divalproex sodium]; Penicillins; Tape; and Latex   Review of Systems Review of Systems 10 Systems reviewed and are negative for acute change except as noted in the HPI.    Physical Exam Updated Vital Signs BP 118/69 (BP Location: Left Arm)   Pulse (!) 53   Temp 98.2 F (36.8 C)  (Oral)   Resp 18   Ht 6' (1.829 m)   Wt 78 kg   SpO2 99%   BMI 23.32 kg/m   Physical Exam  Constitutional: He is oriented to person, place, and time.  Patient is alert and well in appearance.  His mental status is clear.  No respiratory distress.  HENT:  Head: Normocephalic and atraumatic.  Mucous membranes pink and moist.  Posterior oropharynx widely patent.  No tongue laceration.  Poor dentition in the front.  Eyes: Pupils are equal, round, and reactive to light. EOM are normal.  Neck: Neck supple.  Cardiovascular: Normal rate, regular rhythm, normal heart sounds and intact distal pulses.  Pulmonary/Chest: Effort normal and breath sounds normal.  Abdominal: Soft. He exhibits no distension. There is no tenderness. There is no guarding.  Musculoskeletal: Normal range of motion. He exhibits no edema or tenderness.  Neurological: He is alert and oriented to person, place, and time. No cranial nerve deficit. He exhibits normal muscle tone. Coordination normal.  Skin: Skin is warm and dry.  Psychiatric: He has a normal mood and affect.     ED Treatments / Results  Labs (all labs ordered are listed, but only abnormal results are displayed) Labs Reviewed  CBG MONITORING, ED    EKG None  Radiology No results found.  Procedures Procedures (including critical care time)  Medications Ordered in ED Medications - No data to display   Initial Impression / Assessment and Plan / ED Course  I have reviewed the triage vital signs and the nursing notes.  Pertinent labs & imaging results that were available during my care of the patient were reviewed by me and considered in my medical decision making (see chart for details).    Patient is clinically well in appearance today.  He has been seen on multiple prior occasions for seizures.  Review of EMR and neurology note indicates that these have been designated as pseudoseizures.  Patient has also had multiple presentations citing  dizziness.  This sounds like vertigo.  There is no change with standing or position change or syncope.  Patient's blood pressure and heart rate are stable.  He has been offered meclizine but not yet filled the prescription.  He is counseled on getting his prescription.  He is also counseled on following up with his PCP and discussing concerns for may be a second opinion or referral to another neurology group.  Patient has been formally discharged from Dr. Amparo Bristol group by review of EMR.  I have explained to pseudoseizures and spent much time counseling and supporting the patient and his wife with recommendations on follow-up and further medical care.  At this time, all symptoms have been long-standing and I do not think added diagnostic studies are indicated at this time.  Patient is stable for further outpatient referrals, diagnostic studies if needed and consultations which can be arranged by his PCP.  Patient his wife seemed to understand the discussion and appreciative of additional  explanation and information.  They do have follow-up plan for continued psychiatric support as well as primary care support.  Final Clinical Impressions(s) / ED Diagnoses   Final diagnoses:  Pseudoseizure  Vertigo    ED Discharge Orders         Ordered    meclizine (ANTIVERT) 25 MG tablet  3 times daily PRN     02/22/18 1723           Charlesetta Shanks, MD 02/22/18 1737

## 2018-02-22 NOTE — ED Notes (Signed)
RX X 1 GIVEN 

## 2018-03-09 ENCOUNTER — Ambulatory Visit: Payer: Medicaid Other | Attending: Family Medicine | Admitting: Family Medicine

## 2018-03-09 VITALS — BP 114/70 | HR 60 | Temp 98.1°F | Resp 22 | Ht 73.0 in | Wt 169.0 lb

## 2018-03-09 DIAGNOSIS — G3184 Mild cognitive impairment, so stated: Secondary | ICD-10-CM | POA: Insufficient documentation

## 2018-03-09 DIAGNOSIS — Z888 Allergy status to other drugs, medicaments and biological substances status: Secondary | ICD-10-CM | POA: Diagnosis not present

## 2018-03-09 DIAGNOSIS — Z880 Allergy status to penicillin: Secondary | ICD-10-CM | POA: Insufficient documentation

## 2018-03-09 DIAGNOSIS — F209 Schizophrenia, unspecified: Secondary | ICD-10-CM | POA: Insufficient documentation

## 2018-03-09 DIAGNOSIS — F319 Bipolar disorder, unspecified: Secondary | ICD-10-CM | POA: Insufficient documentation

## 2018-03-09 DIAGNOSIS — Z885 Allergy status to narcotic agent status: Secondary | ICD-10-CM | POA: Diagnosis not present

## 2018-03-09 DIAGNOSIS — Z79899 Other long term (current) drug therapy: Secondary | ICD-10-CM | POA: Insufficient documentation

## 2018-03-09 DIAGNOSIS — G40909 Epilepsy, unspecified, not intractable, without status epilepticus: Secondary | ICD-10-CM | POA: Insufficient documentation

## 2018-03-09 NOTE — Progress Notes (Signed)
  Pt states Sx uncontrolled despite Sx meds taken.  States has all types of Sz, happens every day.  Wants new neurologist:  pt  states one he has now will not change his meds.

## 2018-03-09 NOTE — Progress Notes (Signed)
Subjective:  Patient ID: Franklin [name], male    DOB: 01-01-1981  Age: 37 y.o. MRN: [id number]  CC: seizure  HPI Franklin [name]  is a 37 year old male with a history of psychogenic nonepileptic spells, bipolar disorder, mild cognitive impairment who presents today requesting referral to a new neurologist. Notes reveal he was discharged from [facility]. He had 4 ED visits last month for seizures and he endorses compliance with Trileptal.  He is here alone but informs me his seizures are usually generalized tonic-clonic with associated loss of consciousness and he is occasionally incontinent of stool and urine. He also has a psychiatrist and endorses compliance with his psych appointments. He has no additional concerns today.  Past Medical History:  Diagnosis Date  . Bipolar 1 disorder ([place])   . Chronic headaches   . Conversion disorder with seizures or convulsions   . 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 Depression   . Hx of electroencephalogram 12/2014   normal  . Mild cognitive impairment   . Psychogenic nonepileptic seizure   . Schizophrenic disorder ([place])   . Seizures ([place])     No past surgical history on file.  Allergies  Allergen Reactions  . Coconut Oil Anaphylaxis and Nausea And Vomiting    Reaction to any kind of coconut  . Other Anaphylaxis    Raisins  . Codeine Nausea And Vomiting  . Depakote [Divalproex Sodium] Other (See Comments)    Pt states that this medication makes him feel like he is "out of it"  . Penicillins Nausea And Vomiting    Has patient had a PCN reaction causing immediate rash, facial/tongue/throat swelling, SOB or lightheadedness with hypotension: No Has patient had a PCN reaction causing severe rash involving mucus membranes or skin necrosis: No Has patient had a PCN reaction that required hospitalization No Has patient had a PCN reaction occurring within  the last 10 years: No If all of the above answers are "NO", then may proceed with Cephalosporin use.    . Tape Other (See Comments)    TEARS THE SKIN; only "paper tape" is tolerated  . Latex Rash     Outpatient Medications Prior to Visit  Medication Sig Dispense Refill  . acetaminophen (TYLENOL) 500 MG tablet Take 500-1,000 mg by mouth every 8 (eight) hours as needed (for pain or headaches).    . cariprazine (VRAYLAR) capsule Take 1.5 mg by mouth daily.     . meclizine (ANTIVERT) 25 MG tablet Take 1 tablet (25 mg total) by mouth 2 (two) times daily as needed for dizziness. 10 tablet 0  . meclizine (ANTIVERT) 25 MG tablet Take 1 tablet (25 mg total) by mouth 3 (three) times daily as needed for dizziness. 30 tablet 0  . Oxcarbazepine (TRILEPTAL) 300 MG tablet Take 1/2 tablet twice a day 30 tablet 11   No facility-administered medications prior to visit.     ROS Review of Systems  Constitutional: Negative for activity change and appetite change.  HENT: Negative for sinus pressure and sore throat.   Eyes: Negative for visual disturbance.  Respiratory: Negative for cough, chest tightness and shortness of breath.   Cardiovascular: Negative for chest pain and leg swelling.  Gastrointestinal: Negative for abdominal distention, abdominal pain, constipation and diarrhea.  Endocrine: Negative.   Genitourinary: Negative for dysuria.  Musculoskeletal: Negative for joint swelling and myalgias.  Skin: Negative for rash.  Allergic/Immunologic: Negative.  Neurological: Negative for weakness, light-headedness and numbness.  Psychiatric/Behavioral: Negative for dysphoric mood and suicidal ideas.    Objective:  BP 114/70 (BP Location: Left Arm, Patient Position: Sitting, Cuff Size: Large)   Pulse 60   Temp 98.1 F (36.7 C)   Resp (!) 22   Ht 6\' 1"  (1.854 m)   Wt 169 lb (76.7 kg)   SpO2 94%   BMI 22.30 kg/m   BP/Weight 03/09/2018 02/22/2018 10/27/509  Systolic BP 021 117 356  Diastolic  BP 70 82 69  Wt. (Lbs) 169 171.96 156  BMI 22.3 23.32 21.16      Physical Exam  Constitutional: He is oriented to person, place, and time. He appears well-developed and well-nourished.  Cardiovascular: Normal rate, normal heart sounds and intact distal pulses.  No murmur heard. Pulmonary/Chest: Effort normal and breath sounds normal. He has no wheezes. He has no rales. He exhibits no tenderness.  Abdominal: Soft. Bowel sounds are normal. He exhibits no distension and no mass. There is no tenderness.  Musculoskeletal: Normal range of motion.  Neurological: He is alert and oriented to person, place, and time.  Skin: Skin is warm and dry.     Assessment & Plan:   1. Seizure disorder [facility]) Uncontrolled Psychogenic versus epilepsy as per neurology notes Discharge from [facility] until visit with neurology - Ambulatory referral to Neurology   No orders of the defined types were placed in this encounter.   Follow-up: No follow-ups on file.   Charlott Rakes MD

## 2018-03-10 ENCOUNTER — Encounter: Payer: Self-pay | Admitting: Family Medicine

## 2018-03-11 ENCOUNTER — Emergency Department (HOSPITAL_COMMUNITY)
Admission: EM | Admit: 2018-03-11 | Discharge: 2018-03-11 | Disposition: A | Discharge status: Home / Self Care | Payer: Medicaid Other | Attending: Emergency Medicine | Admitting: Emergency Medicine

## 2018-03-11 ENCOUNTER — Emergency Department (HOSPITAL_COMMUNITY): Payer: Medicaid Other

## 2018-03-11 DIAGNOSIS — R1084 Generalized abdominal pain: Secondary | ICD-10-CM | POA: Diagnosis present

## 2018-03-11 DIAGNOSIS — Z91040 Latex allergy status: Secondary | ICD-10-CM | POA: Insufficient documentation

## 2018-03-11 DIAGNOSIS — Z79899 Other long term (current) drug therapy: Secondary | ICD-10-CM | POA: Diagnosis not present

## 2018-03-11 LAB — ETHANOL: Alcohol, Ethyl (B): <10 mg/dL (ref <10)

## 2018-03-11 LAB — CBC WITH DIFFERENTIAL/PLATELET
Abs Immature Granulocytes: 0.01 10*3/uL (ref 0.00–0.07)
Basophils Absolute: 0 10*3/uL (ref 0.0–0.1)
Basophils Relative: 1 %
EOS ABS: 0 10*3/uL (ref 0.0–0.5)
EOS PCT: 0 %
HCT: 45.1 % (ref 39.0–52.0)
HEMOGLOBIN: 15 g/dL (ref 13.0–17.0)
Immature Granulocytes: 0 %
LYMPHS PCT: 26 %
Lymphs Abs: 1.6 10*3/uL (ref 0.7–4.0)
MCH: 29.1 pg (ref 26.0–34.0)
MCHC: 33.3 g/dL (ref 30.0–36.0)
MCV: 87.6 fL (ref 80.0–100.0)
MONO ABS: 0.5 10*3/uL (ref 0.1–1.0)
Monocytes Relative: 8 %
Neutro Abs: 4 10*3/uL (ref 1.7–7.7)
Neutrophils Relative %: 65 %
Platelets: 195 10*3/uL (ref 150–400)
RBC: 5.15 MIL/uL (ref 4.22–5.81)
RDW: 11.9 % (ref 11.5–15.5)
WBC: 6.1 10*3/uL (ref 4.0–10.5)
nRBC: 0 % (ref 0.0–0.2)

## 2018-03-11 LAB — COMPREHENSIVE METABOLIC PANEL
ALT: 20 U/L (ref 0–44)
AST: 20 U/L (ref 15–41)
Albumin: 4.3 g/dL (ref 3.5–5.0)
Alkaline Phosphatase: 57 U/L (ref 38–126)
Anion gap: 4 — ABNORMAL LOW (ref 5–15)
BUN: 9 mg/dL (ref 6–20)
CHLORIDE: 108 mmol/L (ref 98–111)
CO2: 27 mmol/L (ref 22–32)
CREATININE: 1 mg/dL (ref 0.61–1.24)
Calcium: 9.5 mg/dL (ref 8.9–10.3)
GFR calc Af Amer: >60 mL/min (ref >60)
Glucose, Bld: 88 mg/dL (ref 70–99)
Potassium: 4.2 mmol/L (ref 3.5–5.1)
SODIUM: 139 mmol/L (ref 135–145)
Total Bilirubin: 1.2 mg/dL (ref 0.3–1.2)
Total Protein: 7.3 g/dL (ref 6.5–8.1)

## 2018-03-11 MED ORDER — SODIUM CHLORIDE 0.9 % IV BOLUS
1000.0000 mL | Freq: Once | INTRAVENOUS | Status: AC
  Administered 2018-03-11: 1000 mL via INTRAVENOUS

## 2018-03-11 NOTE — Discharge Instructions (Addendum)
Try taking Colace 100 mg twice a day for 1 to 2 weeks to improve bowel regularity.  Drink 1 to 2 L of water each day.  Try to eat a high-fiber diet.

## 2018-03-11 NOTE — ED Triage Notes (Signed)
PT to ED from home for epigastric pain and constipation x3 days. Distention and pain in all quadrants. No N/V. Has not eaten today. Hx bipolar, schizophrenia, seizures. Ambulatory, NAD.

## 2018-03-11 NOTE — ED Notes (Signed)
Discharged paperwork reviewed with wife and pt. Verbalized understanding.

## 2018-03-11 NOTE — ED Notes (Signed)
Pt refusing any blood work or IVs

## 2018-03-11 NOTE — ED Provider Notes (Signed)
[facility] EMERGENCY DEPARTMENT Provider Note   CSN: [id number] Arrival date & time: 03/11/18  1401     History   Chief Complaint Chief Complaint  Patient presents with  . Abdominal Pain    HPI Franklin [name] is a 37 y.o. male.  HPI   Is here for evaluation of abdominal pain which reportedly started today.  He is a poor historian.  He was in the room with his wife when he reportedly had a seizure and became confused, while here, before me seeing him.  His wife states that he typically gets confused after a seizure.  She also states that earlier this week, he was told to not take his Trileptal because it was not helping his seizure disorder.  Prior to that he had been taking it sporadically.  There is been no recent illness including fever or vomiting or cough according to the wife.  He was eating well up until yesterday.  There are no other known modifying factors.  Past Medical History:  Diagnosis Date  . Bipolar 1 disorder ([place])   . Chronic headaches   . Conversion disorder with seizures or convulsions   . 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 Depression   . Hx of electroencephalogram 12/2014   normal  . Mild cognitive impairment   . Psychogenic nonepileptic seizure   . Schizophrenic disorder ([place])   . Seizures [facility])     Patient Active Problem List   Diagnosis Date Noted  . Psychogenic nonepileptic seizure 02/18/2017  . Cholelithiasis 01/24/2017  . 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 Abnormal EKG 11/30/2015  . Elevated troponin 11/30/2015  . Bipolar affective disorder, most recent episode unspecified type, remission status unspecified 03/11/2015  . Bipolar affective disorder ([place]) 01/08/2015  . Generalized idiopathic epilepsy and epileptic syndromes, without status epilepticus, not intractable ([place]) 11/27/2014  . Seizure disorder ([place])  10/14/2014    No past surgical history on file.      Home Medications    Prior to Admission medications   Medication Sig Start Date End Date Taking? Authorizing Provider  acetaminophen (TYLENOL) 500 MG tablet Take 500-1,000 mg by mouth every 8 (eight) hours as needed (for pain or headaches).    [provider]  cariprazine (VRAYLAR) capsule Take 1.5 mg by mouth daily.     [provider]  meclizine (ANTIVERT) 25 MG tablet Take 1 tablet (25 mg total) by mouth 2 (two) times daily as needed for dizziness. 02/01/18   Jola Schmidt, MD  meclizine (ANTIVERT) 25 MG tablet Take 1 tablet (25 mg total) by mouth 3 (three) times daily as needed for dizziness. 02/22/18   Charlesetta Shanks, MD  Oxcarbazepine (TRILEPTAL) 300 MG tablet Take 1/2 tablet twice a day 01/26/18   Argentina Donovan, PA-C    Family History Family History  Problem Relation Age of Onset  . Heart disease Mother     Social History Social History   Tobacco Use  . Smoking status: Never Smoker  . Smokeless tobacco: Never Used  Substance Use Topics  . Alcohol use: Not Currently    Alcohol/week: 0.0 standard drinks  . Drug use: No     Allergies   Coconut oil; Other; Codeine; Depakote [divalproex sodium]; Penicillins; Tape; and Latex   Review of Systems Review of Systems  All other systems reviewed and are negative.    Physical Exam Updated Vital Signs  BP (!) 155/52   Pulse 60   Temp 98.4 F (36.9 C) (Oral)   Resp (!) 22   SpO2 100%   Physical Exam  Constitutional: He is oriented to person, place, and time. He appears well-developed and well-nourished. He appears distressed (He is uncomfortable).  HENT:  Head: Normocephalic and atraumatic.  Right Ear: External ear normal.  Left Ear: External ear normal.  Eyes: Pupils are equal, round, and reactive to light. Conjunctivae and EOM are normal.  Neck: Normal range of motion and phonation normal. Neck supple.  Cardiovascular: Normal rate, regular  rhythm and normal heart sounds.  Pulmonary/Chest: Effort normal and breath sounds normal. No respiratory distress. He exhibits no bony tenderness.  Abdominal: Soft. There is tenderness (Diffusely tender mild).  Musculoskeletal: Normal range of motion.  Neurological: He is alert and oriented to person, place, and time. No cranial nerve deficit or sensory deficit. He exhibits normal muscle tone. Coordination normal.  He is confused.  No evidence for dysarthria or aphasia.  Skin: Skin is warm, dry and intact.  Psychiatric:  Patient is possibly responding to internal stimuli.  He has very poor eye contact.  He does answer questions in a purposeful manner and is fairly cooperative.  Nursing note and vitals reviewed.    ED Treatments / Results  Labs (all labs ordered are listed, but only abnormal results are displayed) Labs Reviewed  COMPREHENSIVE METABOLIC PANEL - Abnormal; Notable for the following components:      Result Value   Anion gap 4 (*)    All other components within normal limits  CBC WITH DIFFERENTIAL/PLATELET  ETHANOL    EKG EKG Interpretation  Date/Time:  Saturday March 11 2018 14:10:25 EDT Ventricular Rate:  57 PR Interval:    QRS Duration: 118 QT Interval:  400 QTC Calculation: 390 R Axis:   -7 Text Interpretation:  Sinus rhythm Borderline short PR interval Incomplete right bundle branch block since last tracing no significant change Confirmed by Daleen Bo [phone]) on 03/11/2018 3:05:43 PM   Radiology Dg Abdomen 1 View  Result Date: 03/11/2018 CLINICAL DATA:  37 year old with epigastric pain and constipation. EXAM: ABDOMEN - 1 VIEW COMPARISON:  12/03/2016 FINDINGS: Nonobstructive bowel gas pattern with gas in the rectum. Moderate stool burden in the abdomen. No large abdominal or pelvic calcifications. No acute bone abnormality. Small sclerotic density in the right femoral trochanteric region is unchanged from a pelvic CT from 10/20/2016. IMPRESSION: No  acute abnormality. Moderate stool burden. Electronically Signed   By: Markus Daft M.D.   On: 03/11/2018 15:55    Procedures Procedures (including critical care time)  Medications Ordered in ED Medications  sodium chloride 0.9 % bolus 1,000 mL (0 mLs Intravenous Stopped 03/11/18 1553)     Initial Impression / Assessment and Plan / ED Course  I have reviewed the triage vital signs and the nursing notes.  Pertinent labs & imaging results that were available during my care of the patient were reviewed by me and considered in my medical decision making (see chart for details).  Clinical Course as of Mar 12 1599  Sat Mar 11, 2018  1546 Normal  CBC with Differential [EW]  1546 Normal  Comprehensive metabolic panel(!) [EW]    Clinical Course User Index [EW] Daleen Bo, MD     Patient Vitals for the past 24 hrs:  BP Temp Temp src Pulse Resp SpO2  03/11/18 1430 (!) 155/52 - - - (!) 22 -  03/11/18 1415 137/90 - - 60  20 100 %  03/11/18 1405 122/81 98.4 F (36.9 C) Oral 60 19 99 %  03/11/18 1404 - - - - - 98 %    3:56 PM Reevaluation with update and discussion. After initial assessment and treatment, an updated evaluation reveals is comfortable now he is completed 1 L of saline.  He denies dysuria, urinary frequency or ongoing abdominal pain.  At his time he is giving good eye contact, interactive and calm.  I discussed with patient and family members, all questions answered. Daleen Bo   Medical Decision Making: Nonspecific abdominal pain short duration, resolved after treatment in ED.  Doubt bowel obstruction, significant constipation or metabolic instability.  CRITICAL CARE-no Performed by: Daleen Bo   Nursing Notes Reviewed/ Care Coordinated Applicable Imaging Reviewed Interpretation of Laboratory Data incorporated into ED treatment  The patient appears reasonably screened and/or stabilized for discharge and I doubt any other medical condition or other [facility] requiring  further screening, evaluation, or treatment in the ED at this time prior to discharge.  Plan: Home Medications-Colace twice a day, to improve bowel regularity, continue current medications; Home Treatments-gradually advance diet; return here if the recommended treatment, does not improve the symptoms; Recommended follow up-PCP follow-up 5 to 7 days for further evaluation and treatment.    Final Clinical Impressions(s) / ED Diagnoses   Final diagnoses:  Generalized abdominal pain    ED Discharge Orders    None       Daleen Bo, MD 03/11/18 1600

## 2018-03-11 NOTE — ED Notes (Signed)
Patient transported to X-ray 

## 2018-03-20 ENCOUNTER — Encounter (HOSPITAL_COMMUNITY): Payer: Self-pay | Admitting: Emergency Medicine

## 2018-03-20 ENCOUNTER — Emergency Department (HOSPITAL_COMMUNITY)
Admission: EM | Admit: 2018-03-20 | Discharge: 2018-03-20 | Disposition: A | Discharge status: Home / Self Care | Payer: Medicaid Other | Attending: Emergency Medicine | Admitting: Emergency Medicine

## 2018-03-20 DIAGNOSIS — Z5321 Procedure and treatment not carried out due to patient leaving prior to being seen by health care provider: Secondary | ICD-10-CM | POA: Insufficient documentation

## 2018-03-20 DIAGNOSIS — R55 Syncope and collapse: Secondary | ICD-10-CM | POA: Diagnosis present

## 2018-03-20 LAB — CBG MONITORING, ED: Glucose-Capillary: 89 mg/dL (ref 70–99)

## 2018-03-20 LAB — CBC
HEMATOCRIT: 45.9 % (ref 39.0–52.0)
Hemoglobin: 15.5 g/dL (ref 13.0–17.0)
MCH: 29.4 pg (ref 26.0–34.0)
MCHC: 33.8 g/dL (ref 30.0–36.0)
MCV: 87.1 fL (ref 80.0–100.0)
NRBC: 0 % (ref 0.0–0.2)
Platelets: 196 10*3/uL (ref 150–400)
RBC: 5.27 MIL/uL (ref 4.22–5.81)
RDW: 11.9 % (ref 11.5–15.5)
WBC: 6.2 10*3/uL (ref 4.0–10.5)

## 2018-03-20 LAB — BASIC METABOLIC PANEL
Anion gap: 8 (ref 5–15)
BUN: 14 mg/dL (ref 6–20)
CHLORIDE: 105 mmol/L (ref 98–111)
CO2: 25 mmol/L (ref 22–32)
CREATININE: 1.01 mg/dL (ref 0.61–1.24)
Calcium: 9.6 mg/dL (ref 8.9–10.3)
GFR calc non Af Amer: >60 mL/min (ref >60)
GLUCOSE: 85 mg/dL (ref 70–99)
Potassium: 3.7 mmol/L (ref 3.5–5.1)
Sodium: 138 mmol/L (ref 135–145)

## 2018-03-20 NOTE — ED Notes (Addendum)
No answer for vital recheck 

## 2018-03-20 NOTE — ED Triage Notes (Signed)
Pt to ER for evaluation of light headed and near syncope with standing onset 2 hours ago. CBG 89. Pt in NAD.

## 2018-03-21 ENCOUNTER — Telehealth: Payer: Self-pay | Admitting: Family Medicine

## 2018-03-21 ENCOUNTER — Other Ambulatory Visit: Payer: Self-pay

## 2018-03-21 NOTE — Telephone Encounter (Signed)
The patient doesn't have a diagnosis of diabetes or prediabetes and all of his recent labs show normal glucose levels. If he is concerned about his blood sugar this should be discussed at an appt with his PCP and they can decide if it is neccessary for him to test his blood sugar.

## 2018-03-21 NOTE — Telephone Encounter (Signed)
Left VM for patient informing him that he needs an appt to discuss need for testing blood sugar with PCP, was advised to call back to schedule appt.

## 2018-03-21 NOTE — Telephone Encounter (Signed)
1) Medication(s) Requested (by name): wants a meter   2) Pharmacy of Choice: [facility]   3) Special Requests: Please call patient if he can not get one   Approved medications will be sent to the pharmacy, we will reach out if there is an issue.  Requests made after 3pm may not be addressed until the following business day!  If a patient is unsure of the name of the medication(s) please note and ask patient to call back when they are able to provide all info, do not send to responsible party until all information is available!

## 2018-03-22 ENCOUNTER — Ambulatory Visit: Payer: Self-pay | Admitting: Neurology

## 2018-03-22 ENCOUNTER — Ambulatory Visit: Payer: Self-pay | Admitting: Family Medicine

## 2018-04-03 ENCOUNTER — Ambulatory Visit (HOSPITAL_COMMUNITY)
Admission: EM | Admit: 2018-04-03 | Discharge: 2018-04-03 | Disposition: A | Discharge status: Home / Self Care | Payer: Medicaid Other | Attending: Family Medicine | Admitting: Family Medicine

## 2018-04-03 ENCOUNTER — Encounter (HOSPITAL_COMMUNITY): Payer: Self-pay | Admitting: *Deleted

## 2018-04-03 ENCOUNTER — Other Ambulatory Visit: Payer: Self-pay

## 2018-04-03 DIAGNOSIS — J22 Unspecified acute lower respiratory infection: Secondary | ICD-10-CM | POA: Diagnosis not present

## 2018-04-03 MED ORDER — AZITHROMYCIN 250 MG PO TABS: 250.0000 mg | ORAL_TABLET | Freq: Every day | ORAL | 0 refills | Status: DC

## 2018-04-03 MED ORDER — BENZONATATE 200 MG PO CAPS: 200.0000 mg | ORAL_CAPSULE | Freq: Two times a day (BID) | ORAL | 0 refills | Status: DC | PRN

## 2018-04-03 NOTE — ED Triage Notes (Signed)
C/o cough onset 2 weeks ago states it worse at night.

## 2018-04-03 NOTE — ED Provider Notes (Signed)
[place]    CSN: [id number] Arrival date & time: 04/03/18  9983     History   Chief Complaint Chief Complaint  Patient presents with  . Cough    HPI Franklin [name] is a 37 y.o. male.   HPI  Patient is here for an upper respiratory infection.  He states he started off with a cold.'s been over 2 weeks.  He continues to cough.  He coughs mostly at night.  His cough is productive of scant yellow to tan sputum.  No blood.  No underlying lung disease.  Non-smoker.  No history of asthma.  He does not think he is wheezing.  He is not short of breath.  He is feeling more tired.  Last couple of nights he states he has had sweats.  No history of pneumonia.  Past Medical History:  Diagnosis Date  . Bipolar 1 disorder ([place])   . Chronic headaches   . Conversion disorder with seizures or convulsions   . 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 Depression   . Hx of electroencephalogram 12/2014   normal  . Mild cognitive impairment   . Psychogenic nonepileptic seizure   . Schizophrenic disorder ([place])   . Seizures [facility])     Patient Active Problem List   Diagnosis Date Noted  . Psychogenic nonepileptic seizure 02/18/2017  . Cholelithiasis 01/24/2017  . 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 Abnormal EKG 11/30/2015  . Elevated troponin 11/30/2015  . Bipolar affective disorder, most recent episode unspecified type, remission status unspecified 03/11/2015  . Bipolar affective disorder ([place]) 01/08/2015  . Generalized idiopathic epilepsy and epileptic syndromes, without status epilepticus, not intractable ([place]) 11/27/2014  . Seizure disorder ([place]) 10/14/2014    History reviewed. No pertinent surgical history.     Home Medications    Prior to Admission medications   Medication Sig Start Date End Date Taking? Authorizing Provider  acetaminophen (TYLENOL) 500 MG tablet  Take 500-1,000 mg by mouth every 8 (eight) hours as needed (for pain or headaches).    [provider]  azithromycin (ZITHROMAX) 250 MG tablet Take 1 tablet (250 mg total) by mouth daily. Take first 2 tablets together, then 1 every day until finished. 04/03/18   Raylene Everts, MD  benzonatate (TESSALON) 200 MG capsule Take 1 capsule (200 mg total) by mouth 2 (two) times daily as needed for cough. 04/03/18   Raylene Everts, MD  cariprazine [facility]) capsule Take 1.5 mg by mouth daily.     [provider]  meclizine (ANTIVERT) 25 MG tablet Take 1 tablet (25 mg total) by mouth 2 (two) times daily as needed for dizziness. 02/01/18   Jola Schmidt, MD  meclizine (ANTIVERT) 25 MG tablet Take 1 tablet (25 mg total) by mouth 3 (three) times daily as needed for dizziness. 02/22/18   Charlesetta Shanks, MD  Oxcarbazepine (TRILEPTAL) 300 MG tablet Take 1/2 tablet twice a day 01/26/18   Argentina Donovan, PA-C    Family History Family History  Problem Relation Age of Onset  . Heart disease Mother     Social History Social History   Tobacco Use  . Smoking status: Never Smoker  . Smokeless tobacco: Never Used  Substance Use Topics  . Alcohol use: Not Currently    Alcohol/week: 0.0 standard drinks  . Drug use: No     Allergies   Coconut oil; Other; Codeine;  Depakote [divalproex sodium]; Penicillins; Tape; and Latex   Review of Systems Review of Systems  Constitutional: Positive for diaphoresis and fatigue. Negative for chills and fever.  HENT: Positive for dental problem. Negative for ear pain and sore throat.   Eyes: Negative for pain and visual disturbance.  Respiratory: Positive for cough. Negative for shortness of breath.   Cardiovascular: Negative for chest pain and palpitations.  Gastrointestinal: Negative for abdominal pain and vomiting.  Genitourinary: Negative for dysuria and hematuria.  Musculoskeletal: Negative for arthralgias and back pain.  Skin: Negative for  color change and rash.  Neurological: Positive for seizures. Negative for syncope.       History of  Psychiatric/Behavioral: Positive for behavioral problems and dysphoric mood. The patient is nervous/anxious.        History of  All other systems reviewed and are negative.    Physical Exam Triage Vital Signs ED Triage Vitals  Enc Vitals Group     BP 04/03/18 0847 122/83     Pulse Rate 04/03/18 0847 (!) 56     Resp 04/03/18 0847 18     Temp 04/03/18 0847 98.3 F (36.8 C)     Temp Source 04/03/18 0847 Oral     SpO2 04/03/18 0847 97 %     Weight --      Height --      Head Circumference --      Peak Flow --      Pain Score 04/03/18 0849 8     Pain Loc --      Pain Edu? --      Excl. in [place]? --    No data found.  Updated Vital Signs BP 122/83 (BP Location: Left Arm)   Pulse (!) 56   Temp 98.3 F (36.8 C) (Oral)   Resp 18   SpO2 97%      Physical Exam  Constitutional: He appears well-developed and well-nourished. No distress.  HENT:  Head: Normocephalic and atraumatic.  Right Ear: External ear normal.  Left Ear: External ear normal.  Mouth/Throat: Oropharynx is clear and moist.  Poor dentition  Eyes: Pupils are equal, round, and reactive to light. Conjunctivae are normal.  Neck: Normal range of motion.  Cardiovascular: Normal rate, regular rhythm and normal heart sounds.  Pulmonary/Chest: Effort normal. No respiratory distress. He has wheezes.  Few scattered inspiratory wheeze.  Central rhonchi.  Abdominal: Soft. He exhibits no distension.  Musculoskeletal: Normal range of motion. He exhibits no edema.  Neurological: He is alert.  Skin: Skin is warm and dry.  Psychiatric: He has a normal mood and affect.     UC Treatments / Results  Labs (all labs ordered are listed, but only abnormal results are displayed) Labs Reviewed - No data to display  EKG None  Radiology No results found.  Procedures Procedures (including critical care time)  Medications  Ordered in UC Medications - No data to display  Initial Impression / Assessment and Plan / UC Course  I have reviewed the triage vital signs and the nursing notes.  Pertinent labs & imaging results that were available during my care of the patient were reviewed by me and considered in my medical decision making (see chart for details).      Final Clinical Impressions(s) / UC Diagnoses   Final diagnoses:  Lower resp. tract infection     Discharge Instructions     Push fluids Take the antibiotic as directed May use a humidifier if needed Take the tessalon 2  x a day for cough   ED Prescriptions    Medication Sig Dispense Auth. Provider   benzonatate (TESSALON) 200 MG capsule Take 1 capsule (200 mg total) by mouth 2 (two) times daily as needed for cough. 20 capsule Raylene Everts, MD   azithromycin (ZITHROMAX) 250 MG tablet Take 1 tablet (250 mg total) by mouth daily. Take first 2 tablets together, then 1 every day until finished. 6 tablet Raylene Everts, MD     Controlled Substance Prescriptions [place] Controlled Substance Registry consulted? Not Applicable   Raylene Everts, MD 04/03/18 1004

## 2018-04-03 NOTE — Discharge Instructions (Signed)
Push fluids Take the antibiotic as directed May use a humidifier if needed Take the tessalon 2 x a day for cough

## 2018-05-22 DIAGNOSIS — R42 Dizziness and giddiness: Secondary | ICD-10-CM | POA: Insufficient documentation

## 2018-05-22 DIAGNOSIS — G43009 Migraine without aura, not intractable, without status migrainosus: Secondary | ICD-10-CM | POA: Insufficient documentation

## 2018-06-13 ENCOUNTER — Ambulatory Visit: Payer: Self-pay | Admitting: Family Medicine

## 2018-06-22 ENCOUNTER — Emergency Department (HOSPITAL_COMMUNITY)
Admission: EM | Admit: 2018-06-22 | Discharge: 2018-06-22 | Disposition: A | Discharge status: Home / Self Care | Payer: Medicaid Other | Attending: Emergency Medicine | Admitting: Emergency Medicine

## 2018-06-22 ENCOUNTER — Other Ambulatory Visit: Payer: Self-pay

## 2018-06-22 ENCOUNTER — Encounter (HOSPITAL_COMMUNITY): Payer: Self-pay

## 2018-06-22 DIAGNOSIS — Z91040 Latex allergy status: Secondary | ICD-10-CM | POA: Diagnosis not present

## 2018-06-22 DIAGNOSIS — Z79899 Other long term (current) drug therapy: Secondary | ICD-10-CM | POA: Diagnosis not present

## 2018-06-22 DIAGNOSIS — G40909 Epilepsy, unspecified, not intractable, without status epilepticus: Secondary | ICD-10-CM | POA: Insufficient documentation

## 2018-06-22 DIAGNOSIS — R569 Unspecified convulsions: Secondary | ICD-10-CM

## 2018-06-22 LAB — COMPREHENSIVE METABOLIC PANEL
ALBUMIN: 4.3 g/dL (ref 3.5–5.0)
ALK PHOS: 55 U/L (ref 38–126)
ALT: 17 U/L (ref 0–44)
ANION GAP: 7 (ref 5–15)
AST: 20 U/L (ref 15–41)
BILIRUBIN TOTAL: 1.3 mg/dL — AB (ref 0.3–1.2)
BUN: 12 mg/dL (ref 6–20)
CALCIUM: 9.3 mg/dL (ref 8.9–10.3)
CO2: 27 mmol/L (ref 22–32)
Chloride: 105 mmol/L (ref 98–111)
Creatinine, Ser: 0.94 mg/dL (ref 0.61–1.24)
GFR calc Af Amer: >60 mL/min (ref >60)
GFR calc non Af Amer: >60 mL/min (ref >60)
GLUCOSE: 81 mg/dL (ref 70–99)
Potassium: 4 mmol/L (ref 3.5–5.1)
Sodium: 139 mmol/L (ref 135–145)
TOTAL PROTEIN: 7.1 g/dL (ref 6.5–8.1)

## 2018-06-22 LAB — CBC WITH DIFFERENTIAL/PLATELET
Abs Immature Granulocytes: 0.01 10*3/uL (ref 0.00–0.07)
BASOS ABS: 0 10*3/uL (ref 0.0–0.1)
Basophils Relative: 1 %
EOS ABS: 0 10*3/uL (ref 0.0–0.5)
EOS PCT: 1 %
HEMATOCRIT: 42 % (ref 39.0–52.0)
HEMOGLOBIN: 14.4 g/dL (ref 13.0–17.0)
IMMATURE GRANULOCYTES: 0 %
LYMPHS ABS: 2 10*3/uL (ref 0.7–4.0)
Lymphocytes Relative: 33 %
MCH: 29.9 pg (ref 26.0–34.0)
MCHC: 34.3 g/dL (ref 30.0–36.0)
MCV: 87.1 fL (ref 80.0–100.0)
Monocytes Absolute: 0.4 10*3/uL (ref 0.1–1.0)
Monocytes Relative: 7 %
NEUTROS PCT: 58 %
Neutro Abs: 3.6 10*3/uL (ref 1.7–7.7)
Platelets: 188 10*3/uL (ref 150–400)
RBC: 4.82 MIL/uL (ref 4.22–5.81)
RDW: 11.9 % (ref 11.5–15.5)
WBC: 6 10*3/uL (ref 4.0–10.5)
nRBC: 0 % (ref 0.0–0.2)

## 2018-06-22 MED ORDER — OXCARBAZEPINE 150 MG PO TABS
150.0000 mg | ORAL_TABLET | Freq: Once | ORAL | Status: AC
  Administered 2018-06-22: 150 mg via ORAL
  Filled 2018-06-22: qty 1

## 2018-06-22 NOTE — ED Notes (Signed)
Pt denied blood stick, also states no one is going to stick him. That is what the IV is used for. Notified RN.

## 2018-06-22 NOTE — ED Provider Notes (Signed)
[facility] EMERGENCY DEPARTMENT Provider Note   CSN: [id number] Arrival date & time: 06/22/18  1458     History   Chief Complaint Chief Complaint  Patient presents with  . Seizures    HPI Franklin [name] is a 38 y.o. male.  Patient has a history of nonepileptic seizures.  He takes Trileptal.  Patient had a seizure today that lasted for about a minute.  Patient is back to his normal now  The history is provided by the patient.  Seizures  Seizure activity on arrival: no   Seizure type:  Grand mal Preceding symptoms: aura   Initial focality:  None Episode characteristics: abnormal movements   Postictal symptoms: no confusion   Return to baseline: yes   Severity:  Moderate Duration:  1 minute Timing:  Once Progression:  Resolved Context: alcohol withdrawal   Recent head injury:  No recent head injuries PTA treatment:  None History of seizures: no     Past Medical History:  Diagnosis Date  . Bipolar 1 disorder ([place])   . Chronic headaches   . Conversion disorder with seizures or convulsions   . 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 Depression   . Hx of electroencephalogram 12/2014   normal  . Mild cognitive impairment   . Psychogenic nonepileptic seizure   . Schizophrenic disorder ([place])   . Seizures [facility])     Patient Active Problem List   Diagnosis Date Noted  . Psychogenic nonepileptic seizure 02/18/2017  . Cholelithiasis 01/24/2017  . 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 Abnormal EKG 11/30/2015  . Elevated troponin 11/30/2015  . Bipolar affective disorder, most recent episode unspecified type, remission status unspecified 03/11/2015  . Bipolar affective disorder ([place]) 01/08/2015  . Generalized idiopathic epilepsy and epileptic syndromes, without status epilepticus, not intractable ([place]) 11/27/2014  . Seizure disorder ([place]) 10/14/2014     History reviewed. No pertinent surgical history.      Home Medications    Prior to Admission medications   Medication Sig Start Date End Date Taking? Authorizing Provider  acetaminophen (TYLENOL) 500 MG tablet Take 500-1,000 mg by mouth every 8 (eight) hours as needed (for pain or headaches).    [provider]  azithromycin (ZITHROMAX) 250 MG tablet Take 1 tablet (250 mg total) by mouth daily. Take first 2 tablets together, then 1 every day until finished. 04/03/18   Raylene Everts, MD  benzonatate (TESSALON) 200 MG capsule Take 1 capsule (200 mg total) by mouth 2 (two) times daily as needed for cough. 04/03/18   Raylene Everts, MD  cariprazine [facility]) capsule Take 1.5 mg by mouth daily.     [provider]  meclizine (ANTIVERT) 25 MG tablet Take 1 tablet (25 mg total) by mouth 2 (two) times daily as needed for dizziness. 02/01/18   Jola Schmidt, MD  meclizine (ANTIVERT) 25 MG tablet Take 1 tablet (25 mg total) by mouth 3 (three) times daily as needed for dizziness. 02/22/18   Charlesetta Shanks, MD  Oxcarbazepine (TRILEPTAL) 300 MG tablet Take 1/2 tablet twice a day 01/26/18   Argentina Donovan, PA-C    Family History Family History  Problem Relation Age of Onset  . Heart disease Mother     Social History Social History   Tobacco Use  . Smoking status: Never Smoker  . Smokeless tobacco: Never Used  Substance Use Topics  . Alcohol use:  Not Currently    Alcohol/week: 0.0 standard drinks  . Drug use: No     Allergies   Coconut oil; Other; Codeine; Depakote [divalproex sodium]; Penicillins; Tape; and Latex   Review of Systems Review of Systems  Constitutional: Negative for appetite change and fatigue.  HENT: Negative for congestion, ear discharge and sinus pressure.   Eyes: Negative for discharge.  Respiratory: Negative for cough.   Cardiovascular: Negative for chest pain.  Gastrointestinal: Negative for abdominal pain and diarrhea.    Genitourinary: Negative for frequency and hematuria.  Musculoskeletal: Negative for back pain.  Skin: Negative for rash.  Neurological: Positive for seizures. Negative for headaches.  Psychiatric/Behavioral: Negative for hallucinations.     Physical Exam Updated Vital Signs BP 115/83   Pulse (!) 49   Resp 13   SpO2 100%   Physical Exam Vitals signs and nursing note reviewed.  Constitutional:      Appearance: He is well-developed.  HENT:     Head: Normocephalic.     Nose: Nose normal.  Eyes:     General: No scleral icterus.    Conjunctiva/sclera: Conjunctivae normal.  Neck:     Musculoskeletal: Neck supple.     Thyroid: No thyromegaly.  Cardiovascular:     Rate and Rhythm: Normal rate and regular rhythm.     Heart sounds: No murmur. No friction rub. No gallop.   Pulmonary:     Breath sounds: No stridor. No wheezing or rales.  Chest:     Chest wall: No tenderness.  Abdominal:     General: There is no distension.     Tenderness: There is no abdominal tenderness. There is no rebound.  Musculoskeletal: Normal range of motion.  Lymphadenopathy:     Cervical: No cervical adenopathy.  Skin:    Findings: No erythema or rash.  Neurological:     Mental Status: He is oriented to person, place, and time.     Motor: No abnormal muscle tone.     Coordination: Coordination normal.  Psychiatric:        Behavior: Behavior normal.      ED Treatments / Results  Labs (all labs ordered are listed, but only abnormal results are displayed) Labs Reviewed  COMPREHENSIVE METABOLIC PANEL - Abnormal; Notable for the following components:      Result Value   Total Bilirubin 1.3 (*)    All other components within normal limits  CBC WITH DIFFERENTIAL/PLATELET    EKG None  Radiology No results found.  Procedures Procedures (including critical care time)  Medications Ordered in ED Medications  OXcarbazepine (TRILEPTAL) tablet 150 mg (150 mg Oral Given 06/22/18 1606)      Initial Impression / Assessment and Plan / ED Course  I have reviewed the triage vital signs and the nursing notes.  Pertinent labs & imaging results that were available during my care of the patient were reviewed by me and considered in my medical decision making (see chart for details).    Patient with nonepileptic seizures.  Patient is stable now.  He was given 1 extra Trileptal today.  He will call his neurologist and follow-up Final Clinical Impressions(s) / ED Diagnoses   Final diagnoses:  Seizure [facility])    ED Discharge Orders    None       Milton Ferguson, MD 06/22/18 [phone]

## 2018-06-22 NOTE — ED Triage Notes (Signed)
[facility]- pt coming from home with complaint of seizure, hx of same. Pt reports he has been taking medications. Pt reported to EMS chest wall pain, EKG unremarkable. Pt alert and oriented, skin warm and dry, vitals stable.

## 2018-06-22 NOTE — Discharge Instructions (Addendum)
Go ahead and take your seizure medicine tonight.  Call your neurologist tomorrow and let them know you had a seizure and arrange for a follow-up appointment next week.

## 2018-07-26 ENCOUNTER — Encounter (HOSPITAL_COMMUNITY): Payer: Self-pay

## 2018-07-26 ENCOUNTER — Ambulatory Visit (HOSPITAL_COMMUNITY)
Admission: EM | Admit: 2018-07-26 | Discharge: 2018-07-26 | Disposition: A | Discharge status: Home / Self Care | Payer: Medicaid Other | Attending: Family Medicine | Admitting: Family Medicine

## 2018-07-26 DIAGNOSIS — R21 Rash and other nonspecific skin eruption: Secondary | ICD-10-CM | POA: Insufficient documentation

## 2018-07-26 NOTE — ED Provider Notes (Signed)
[place]    CSN: [id number] Arrival date & time: 07/26/18  1445     History   Chief Complaint Chief Complaint  Patient presents with  . Rash    HPI Franklin [name] is a 38 y.o. male.   Patient is a 38 year old male that presents with rash to groin area.  Reports that he noticed this yesterday.  Symptoms have been constant.  He has not done anything to treat his symptoms.  He describes the rash as itching, burning, painful to touch.  He has had some drainage from the area.  He is currently sexually active with one partner, his wife which is present in the room.  Does not seem concerned about STDs.  He denies any associated penile discharge, dysuria, testicle pain or swelling.  ROS per HPI      Past Medical History:  Diagnosis Date  . Bipolar 1 disorder ([place])   . Chronic headaches   . Conversion disorder with seizures or convulsions   . 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 Depression   . Hx of electroencephalogram 12/2014   normal  . Mild cognitive impairment   . Psychogenic nonepileptic seizure   . Schizophrenic disorder ([place])   . Seizures [facility])     Patient Active Problem List   Diagnosis Date Noted  . Psychogenic nonepileptic seizure 02/18/2017  . Cholelithiasis 01/24/2017  . 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 Abnormal EKG 11/30/2015  . Elevated troponin 11/30/2015  . Bipolar affective disorder, most recent episode unspecified type, remission status unspecified 03/11/2015  . Bipolar affective disorder ([place]) 01/08/2015  . Generalized idiopathic epilepsy and epileptic syndromes, without status epilepticus, not intractable ([place]) 11/27/2014  . Seizure disorder ([place]) 10/14/2014    History reviewed. No pertinent surgical history.     Home Medications    Prior to Admission medications   Medication Sig Start Date End Date Taking? Authorizing  Provider  acetaminophen (TYLENOL) 500 MG tablet Take 500-1,000 mg by mouth every 8 (eight) hours as needed (for pain or headaches).    [provider]  cariprazine (VRAYLAR) capsule Take 1.5 mg by mouth daily.     [provider]  meclizine (ANTIVERT) 25 MG tablet Take 1 tablet (25 mg total) by mouth 2 (two) times daily as needed for dizziness. 02/01/18   Jola Schmidt, MD  meclizine (ANTIVERT) 25 MG tablet Take 1 tablet (25 mg total) by mouth 3 (three) times daily as needed for dizziness. 02/22/18   Charlesetta Shanks, MD  Oxcarbazepine (TRILEPTAL) 300 MG tablet Take 1/2 tablet twice a day 01/26/18   Argentina Donovan, PA-C    Family History Family History  Problem Relation Age of Onset  . Heart disease Mother     Social History Social History   Tobacco Use  . Smoking status: Never Smoker  . Smokeless tobacco: Never Used  Substance Use Topics  . Alcohol use: Not Currently    Alcohol/week: 0.0 standard drinks  . Drug use: No     Allergies   Coconut oil; Other; Codeine; Depakote [divalproex sodium]; Penicillins; Tape; and Latex   Review of Systems Review of Systems   Physical Exam Triage Vital Signs ED Triage Vitals  Enc Vitals Group     BP 07/26/18 1514 125/78     Pulse Rate 07/26/18 1514 (!) 58     Resp 07/26/18 1514 16     Temp 07/26/18  1514 98.6 F (37 C)     Temp Source 07/26/18 1514 Oral     SpO2 07/26/18 1514 94 %     Weight --      Height --      Head Circumference --      Peak Flow --      Pain Score 07/26/18 1515 7     Pain Loc --      Pain Edu? --      Excl. in [place]? --    No data found.  Updated Vital Signs BP 125/78 (BP Location: Right Arm)   Pulse (!) 58   Temp 98.6 F (37 C) (Oral)   Resp 16   SpO2 94%   Visual Acuity Right Eye Distance:   Left Eye Distance:   Bilateral Distance:    Right Eye Near:   Left Eye Near:    Bilateral Near:     Physical Exam Vitals signs and nursing note reviewed. Exam conducted with a  chaperone present.  Constitutional:      General: He is not in acute distress.    Appearance: Normal appearance. He is well-developed. He is not ill-appearing or toxic-appearing.  HENT:     Head: Normocephalic and atraumatic.     Mouth/Throat:     Pharynx: Oropharynx is clear.  Eyes:     Conjunctiva/sclera: Conjunctivae normal.  Pulmonary:     Effort: Pulmonary effort is normal.  Genitourinary:    Penis: Normal.      Scrotum/Testes: Normal.     Comments: Wife present during exam.  Musculoskeletal: Normal range of motion.  Skin:    General: Skin is warm and dry.     Findings: Rash present.     Comments: Small cluster of vesicular appearing lesions to left upper inner thigh adjacent to testicles Very tender to touch.   Neurological:     Mental Status: He is alert.  Psychiatric:        Mood and Affect: Mood normal.      UC Treatments / Results  Labs (all labs ordered are listed, but only abnormal results are displayed) Labs Reviewed  HSV CULTURE AND TYPING    EKG None  Radiology No results found.  Procedures Procedures (including critical care time)  Medications Ordered in UC Medications - No data to display  Initial Impression / Assessment and Plan / UC Course  I have reviewed the triage vital signs and the nursing notes.  Pertinent labs & imaging results that were available during my care of the patient were reviewed by me and considered in my medical decision making (see chart for details).     Symptoms consistent with HSV Swab sent for testing Pt wants to  wait on results for treatment Will have patient do hydrocortisone cream to area to soothe until we will get results  Final Clinical Impressions(s) / UC Diagnoses   Final diagnoses:  Rash     Discharge Instructions     We are sending a swab of the area to test for herpes.  Hopefully we will have the results by tomorrow.  You can put some hydrocortisone cream on the area to help soothe until  then      ED Prescriptions    None     Controlled Substance Prescriptions [place] Controlled Substance Registry consulted? Not Applicable   Orvan July, NP 07/27/18 [phone]

## 2018-07-26 NOTE — Discharge Instructions (Addendum)
We are sending a swab of the area to test for herpes.  Hopefully we will have the results by tomorrow.  You can put some hydrocortisone cream on the area to help soothe until then

## 2018-07-26 NOTE — ED Triage Notes (Signed)
Pt presents with rash on groin area with pain & itching.

## 2018-07-27 ENCOUNTER — Encounter (HOSPITAL_COMMUNITY): Payer: Self-pay | Admitting: Emergency Medicine

## 2018-07-27 ENCOUNTER — Emergency Department (HOSPITAL_COMMUNITY)
Admission: EM | Admit: 2018-07-27 | Discharge: 2018-07-27 | Disposition: A | Discharge status: Home / Self Care | Payer: Medicaid Other | Attending: Emergency Medicine | Admitting: Emergency Medicine

## 2018-07-27 DIAGNOSIS — F250 Schizoaffective disorder, bipolar type: Secondary | ICD-10-CM | POA: Diagnosis not present

## 2018-07-27 DIAGNOSIS — Z79899 Other long term (current) drug therapy: Secondary | ICD-10-CM | POA: Insufficient documentation

## 2018-07-27 DIAGNOSIS — R55 Syncope and collapse: Secondary | ICD-10-CM | POA: Insufficient documentation

## 2018-07-27 DIAGNOSIS — Z91040 Latex allergy status: Secondary | ICD-10-CM | POA: Insufficient documentation

## 2018-07-27 NOTE — Discharge Instructions (Addendum)
Get help right away if: You have a severe headache. You have unusual pain in your chest, abdomen, or back. You are bleeding from your mouth or rectum, or you have black or tarry stool. You have a very fast or irregular heartbeat (palpitations). You faint once or repeatedly. You have a seizure. You are confused. You have trouble walking. You have severe weakness. You have vision problems.

## 2018-07-27 NOTE — ED Notes (Signed)
Went in to collect blood work, EKG and orthostatics and patient refusing at this time stating that he" just wants to go home to take his medications". Provider notified.

## 2018-07-27 NOTE — ED Notes (Signed)
Patient verbalizes understanding of discharge instructions. Opportunity for questioning and answers were provided. Armband removed by staff, pt discharged from ED.  

## 2018-07-27 NOTE — ED Triage Notes (Signed)
Pt is schizophrenic with bipolar. Was at [employer] and became fixed and drooling. Did not have seizure meds this morning. Will swing when approached by staff. Wife with him and states seizures are tonic clonic and not like this at all. Only words are "I want to go home, get off me, and stop".

## 2018-07-27 NOTE — ED Provider Notes (Signed)
[place] EMERGENCY DEPARTMENT Provider Note   CSN: [id number] Arrival date & time: 07/27/18  1433    History   Chief Complaint Chief Complaint  Patient presents with  . Loss of Consciousness    HPI Franklin [name] is a 38 y.o. male who presents for syncopal episode. He has a pmh pseudoseizures, conversion disorder, and schizophrenia. He is well known to myself and this emergency system. The patient was at the grocery store with his wife PTA. The patient apparently felt dizzy slid down to the ground and then stared ahead and drooled. This is not like his previous episodes of pseudoseizure. Episode lasted about 40min and resolved by the time EMS arrived. The patient denies HA, changeds in vision, UL weakness. He denies racing or skipping in his heart.  Review of EMR shows that in patient's recent neurological evaluation with Dr. Doy Hutching of [facility], his seizures are described as " stares, drools, shakes, and passes out." Patient denies racing heart or palpitations, melena, hematochezia, dehydration or recent illness.      HPI  Past Medical History:  Diagnosis Date  . Bipolar 1 disorder ([place])   . Chronic headaches   . Conversion disorder with seizures or convulsions   . 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 Depression   . Hx of electroencephalogram 12/2014   normal  . Mild cognitive impairment   . Psychogenic nonepileptic seizure   . Schizophrenic disorder ([place])   . Seizures [facility])     Patient Active Problem List   Diagnosis Date Noted  . Psychogenic nonepileptic seizure 02/18/2017  . Cholelithiasis 01/24/2017  . 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 Abnormal EKG 11/30/2015  . Elevated troponin 11/30/2015  . Bipolar affective disorder, most recent episode unspecified type, remission status unspecified 03/11/2015  . Bipolar affective disorder ([place])  01/08/2015  . Generalized idiopathic epilepsy and epileptic syndromes, without status epilepticus, not intractable ([place]) 11/27/2014  . Seizure disorder ([place]) 10/14/2014    History reviewed. No pertinent surgical history.      Home Medications    Prior to Admission medications   Medication Sig Start Date End Date Taking? Authorizing Provider  acetaminophen (TYLENOL) 500 MG tablet Take 500-1,000 mg by mouth every 8 (eight) hours as needed (for pain or headaches).    [provider]  cariprazine (VRAYLAR) capsule Take 1.5 mg by mouth daily.     [provider]  meclizine (ANTIVERT) 25 MG tablet Take 1 tablet (25 mg total) by mouth 2 (two) times daily as needed for dizziness. 02/01/18   Jola Schmidt, MD  meclizine (ANTIVERT) 25 MG tablet Take 1 tablet (25 mg total) by mouth 3 (three) times daily as needed for dizziness. 02/22/18   Charlesetta Shanks, MD  Oxcarbazepine (TRILEPTAL) 300 MG tablet Take 1/2 tablet twice a day 01/26/18   Argentina Donovan, PA-C    Family History Family History  Problem Relation Age of Onset  . Heart disease Mother     Social History Social History   Tobacco Use  . Smoking status: Never Smoker  . Smokeless tobacco: Never Used  Substance Use Topics  . Alcohol use: Not Currently    Alcohol/week: 0.0 standard drinks  . Drug use: No     Allergies   Coconut oil; Other; Codeine; Depakote [divalproex sodium]; Penicillins; Tape; and Latex   Review of Systems Review of Systems  Ten systems reviewed and  are negative for acute change, except as noted in the HPI.   Physical Exam Updated Vital Signs BP 123/71 (BP Location: Right Arm)   Pulse (!) 52   Temp 98.3 F (36.8 C) (Oral)   Resp 18   SpO2 98%   Physical Exam Vitals signs and nursing note reviewed.  Constitutional:      General: He is not in acute distress.    Appearance: He is well-developed. He is not diaphoretic.  HENT:     Head: Normocephalic and atraumatic.  Eyes:      General: No scleral icterus.    Conjunctiva/sclera: Conjunctivae normal.     Pupils: Pupils are equal, round, and reactive to light.     Comments: No horizontal, vertical or rotational nystagmus  Neck:     Musculoskeletal: Normal range of motion and neck supple.     Comments: Full active and passive ROM without pain No midline or paraspinal tenderness No nuchal rigidity or meningeal signs Cardiovascular:     Rate and Rhythm: Normal rate and regular rhythm.  Pulmonary:     Effort: Pulmonary effort is normal. No respiratory distress.     Breath sounds: Normal breath sounds. No wheezing or rales.  Abdominal:     General: Bowel sounds are normal.     Palpations: Abdomen is soft.     Tenderness: There is no abdominal tenderness. There is no guarding or rebound.  Musculoskeletal: Normal range of motion.  Lymphadenopathy:     Cervical: No cervical adenopathy.  Skin:    General: Skin is warm and dry.     Findings: No rash.  Neurological:     Mental Status: He is alert and oriented to person, place, and time.     Cranial Nerves: No cranial nerve deficit.     Motor: No abnormal muscle tone.     Coordination: Coordination normal.     Comments: Mental Status:  Alert, oriented, thought content appropriate. Speech fluent without evidence of aphasia. Able to follow 2 step commands without difficulty.  Cranial Nerves:  II:  Peripheral visual fields grossly normal, pupils equal, round, reactive to light III,IV, VI: ptosis not present, extra-ocular motions intact bilaterally  V,VII: smile symmetric, facial light touch sensation equal VIII: hearing grossly normal bilaterally  IX,X: midline uvula rise  XI: bilateral shoulder shrug equal and strong XII: midline tongue extension  Motor:  5/5 in upper and lower extremities bilaterally including strong and equal grip strength and dorsiflexion/plantar flexion Sensory: Pinprick and light touch normal in all extremities.  Cerebellar: normal  finger-to-nose with bilateral upper extremities Gait: normal gait and balance CV: distal pulses palpable throughout   Psychiatric:        Behavior: Behavior normal.        Thought Content: Thought content normal.        Judgment: Judgment normal.      ED Treatments / Results  Labs (all labs ordered are listed, but only abnormal results are displayed) Labs Reviewed - No data to display  EKG None  Radiology No results found.  Procedures Procedures (including critical care time)  Medications Ordered in ED Medications - No data to display   Initial Impression / Assessment and Plan / ED Course  I have reviewed the triage vital signs and the nursing notes.  Pertinent labs & imaging results that were available during my care of the patient were reviewed by me and considered in my medical decision making (see chart for details).  Patient states that he was transported by EMS but did not want to come and does not want to have a work-up done.  He states that this is similar to previous episodes.  His wife agrees and they are asking to be discharged immediately and refused further work-up.  This is reasonable given the symptoms the patient had and his previous known history.  Patient may follow closely with his primary care physician the next 5 to 7 days or return for worsening symptoms.   Final Clinical Impressions(s) / ED Diagnoses   Final diagnoses:  Syncope, unspecified syncope type    ED Discharge Orders    None       Margarita Mail, PA-C 07/27/18 2211    Jola Schmidt, MD 07/28/18 [phone]

## 2018-07-27 NOTE — ED Notes (Signed)
Pt's wife now at bedside states he has not taken his mental health meds for a week- been out. She states he normally does get aggressive after seizures- EMS reported he was very aggressive with them.

## 2018-07-28 LAB — HSV CULTURE AND TYPING

## 2018-08-06 ENCOUNTER — Emergency Department (HOSPITAL_COMMUNITY)
Admission: EM | Admit: 2018-08-06 | Discharge: 2018-08-06 | Disposition: A | Discharge status: Home / Self Care | Payer: Medicaid Other | Attending: Emergency Medicine | Admitting: Emergency Medicine

## 2018-08-06 ENCOUNTER — Encounter (HOSPITAL_COMMUNITY): Payer: Self-pay

## 2018-08-06 ENCOUNTER — Other Ambulatory Visit: Payer: Self-pay

## 2018-08-06 DIAGNOSIS — R42 Dizziness and giddiness: Secondary | ICD-10-CM

## 2018-08-06 NOTE — ED Provider Notes (Signed)
[place] [facility] EMERGENCY DEPARTMENT Provider Note   CSN: [id number] Arrival date & time: 08/06/18  1419    History   Chief Complaint Chief Complaint  Patient presents with  . Dizziness  . Weakness    HPI Noeh Rondon is a 38 y.o. male who presents with dizziness.  Past medical history significant for seizure disorder versus pseudoseizures, conversion disorder, mild cognitive impairment, bipolar disorder.  The patient states that he was walking along [address] when he suddenly started to get dizzy.  He states that this is happened multiple times before.  He called EMS to come pick him up.  By the time they brought him here his symptoms have resolved.Marland Kitchen  He states that if he was at home he would have just taken his meclizine and it would have gotten better but since he was not at home he was unable to do this.  He denies any loss of consciousness, seizure activity.  He does not have any pain.  He is requesting discharge stating that his uncle is coming to pick him up and he does not want a work-up today.    HPI  Past Medical History:  Diagnosis Date  . Bipolar 1 disorder ([place])   . Chronic headaches   . Conversion disorder with seizures or convulsions   . 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 Depression   . Hx of electroencephalogram 12/2014   normal  . Mild cognitive impairment   . Psychogenic nonepileptic seizure   . Schizophrenic disorder ([place])   . Seizures [facility])     Patient Active Problem List   Diagnosis Date Noted  . Psychogenic nonepileptic seizure 02/18/2017  . Cholelithiasis 01/24/2017  . 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 Abnormal EKG 11/30/2015  . Elevated troponin 11/30/2015  . Bipolar affective disorder, most recent episode unspecified type, remission status unspecified 03/11/2015  . Bipolar affective disorder ([place]) 01/08/2015  .  Generalized idiopathic epilepsy and epileptic syndromes, without status epilepticus, not intractable ([place]) 11/27/2014  . Seizure disorder ([place]) 10/14/2014    No past surgical history on file.      Home Medications    Prior to Admission medications   Medication Sig Start Date End Date Taking? Authorizing Provider  acetaminophen (TYLENOL) 500 MG tablet Take 500-1,000 mg by mouth every 8 (eight) hours as needed (for pain or headaches).    [provider]  cariprazine (VRAYLAR) capsule Take 1.5 mg by mouth daily.     [provider]  meclizine (ANTIVERT) 25 MG tablet Take 1 tablet (25 mg total) by mouth 2 (two) times daily as needed for dizziness. Patient taking differently: Take 25 mg by mouth daily as needed for dizziness.  02/01/18   Jola Schmidt, MD  Oxcarbazepine (TRILEPTAL) 300 MG tablet Take 1/2 tablet twice a day 01/26/18   Argentina Donovan, PA-C    Family History Family History  Problem Relation Age of Onset  . Heart disease Mother     Social History Social History   Tobacco Use  . Smoking status: Never Smoker  . Smokeless tobacco: Never Used  Substance Use Topics  . Alcohol use: Not Currently    Alcohol/week: 0.0 standard drinks  . Drug use: No     Allergies   Coconut oil; Other; Codeine; Depakote [divalproex sodium]; Penicillins; Tape; and Latex   Review of Systems Review of Systems  Constitutional: Negative for  activity change and appetite change.  Respiratory: Negative for shortness of breath.   Cardiovascular: Negative for chest pain.  Gastrointestinal: Negative for abdominal pain.  Neurological: Positive for dizziness (resolved). Negative for seizures, syncope, weakness and headaches.  All other systems reviewed and are negative.    Physical Exam Updated Vital Signs BP 135/78   Pulse (!) 58   Temp 98 F (36.7 C) (Oral)   Resp 14   SpO2 97%   Physical Exam Vitals signs and nursing note reviewed.  Constitutional:       General: He is not in acute distress.    Appearance: Normal appearance. He is well-developed. He is not ill-appearing.     Comments: Calm and cooperative  HENT:     Head: Normocephalic and atraumatic.  Eyes:     General: No scleral icterus.       Right eye: No discharge.        Left eye: No discharge.     Conjunctiva/sclera: Conjunctivae normal.     Pupils: Pupils are equal, round, and reactive to light.  Neck:     Musculoskeletal: Normal range of motion.  Cardiovascular:     Rate and Rhythm: Normal rate and regular rhythm.  Pulmonary:     Effort: Pulmonary effort is normal. No respiratory distress.     Breath sounds: Normal breath sounds.  Abdominal:     General: There is no distension.  Skin:    General: Skin is warm and dry.  Neurological:     Mental Status: He is alert and oriented to person, place, and time.     Comments: Mental Status:  Alert, oriented, thought content appropriate, able to give a coherent history. Speech fluent without evidence of aphasia. Able to follow 2 step commands without difficulty.  Cranial Nerves:  II:  Peripheral visual fields grossly normal, pupils equal, round, reactive to light III,IV, VI: ptosis not present, extra-ocular motions intact bilaterally. Mild lateral nystagmus noted V,VII: smile symmetric, facial light touch sensation equal VIII: hearing grossly normal to voice  X: uvula elevates symmetrically  XI: bilateral shoulder shrug symmetric and strong XII: midline tongue extension without fassiculations Motor:  Normal tone. 5/5 in upper and lower extremities bilaterally including strong and equal grip strength and dorsiflexion/plantar flexion Sensory: Pinprick and light touch normal in all extremities.  Deep Tendon Reflexes: 2+ and symmetric in the biceps and patella Cerebellar: normal finger-to-nose with bilateral upper extremities Gait: normal gait and balance CV: distal pulses palpable throughout    Psychiatric:        Behavior:  Behavior normal.      ED Treatments / Results  Labs (all labs ordered are listed, but only abnormal results are displayed) Labs Reviewed - No data to display  EKG None  Radiology No results found.  Procedures Procedures (including critical care time)  Medications Ordered in ED Medications - No data to display   Initial Impression / Assessment and Plan / ED Course  I have reviewed the triage vital signs and the nursing notes.  Pertinent labs & imaging results that were available during my care of the patient were reviewed by me and considered in my medical decision making (see chart for details).  38 year old male presents with acute onset of dizziness while walking.  He states that this happens frequently when he is on his feet for long periods of time.  He states that usually takes meclizine for this but since he was not at home he was unable to do this.  He states symptoms have resolved.  His vital signs are normal.  His neurologic exam is normal.  He does not want any further work-up.  Feel this is reasonable since he has had multiple ED visits for similar symptoms. Pt eloped without receiving d/c paperwork.  Final Clinical Impressions(s) / ED Diagnoses   Final diagnoses:  Dizziness    ED Discharge Orders    None       Recardo Evangelist, PA-C 08/06/18 1527    Duffy Bruce, MD 08/11/18 [phone]

## 2018-08-06 NOTE — ED Notes (Signed)
Patient verbalizes understanding of discharge instructions. Opportunity for questioning and answers were provided. Armband removed by staff, pt discharged from ED.  

## 2018-08-06 NOTE — ED Triage Notes (Signed)
[place] by [facility] [place] for dizziness and generalized weakness x45 minutes. Pt states he had similar episode last week and had a seizure. Pt states he takes all of his medications as prescribed. Pt denies pain, is A+Ox4, ambulatory.

## 2018-10-30 DIAGNOSIS — F3175 Bipolar disorder, in partial remission, most recent episode depressed: Secondary | ICD-10-CM | POA: Diagnosis not present

## 2018-10-31 ENCOUNTER — Emergency Department (HOSPITAL_COMMUNITY)
Admission: EM | Admit: 2018-10-31 | Discharge: 2018-10-31 | Disposition: A | Discharge status: Home / Self Care | Payer: Medicare HMO | Attending: Emergency Medicine | Admitting: Emergency Medicine

## 2018-10-31 ENCOUNTER — Other Ambulatory Visit: Payer: Self-pay

## 2018-10-31 DIAGNOSIS — Z79899 Other long term (current) drug therapy: Secondary | ICD-10-CM | POA: Diagnosis not present

## 2018-10-31 DIAGNOSIS — Z91040 Latex allergy status: Secondary | ICD-10-CM | POA: Diagnosis not present

## 2018-10-31 DIAGNOSIS — R0902 Hypoxemia: Secondary | ICD-10-CM | POA: Diagnosis not present

## 2018-10-31 DIAGNOSIS — G40909 Epilepsy, unspecified, not intractable, without status epilepticus: Secondary | ICD-10-CM | POA: Diagnosis not present

## 2018-10-31 DIAGNOSIS — R42 Dizziness and giddiness: Secondary | ICD-10-CM | POA: Insufficient documentation

## 2018-10-31 DIAGNOSIS — R5383 Other fatigue: Secondary | ICD-10-CM | POA: Insufficient documentation

## 2018-10-31 DIAGNOSIS — F3175 Bipolar disorder, in partial remission, most recent episode depressed: Secondary | ICD-10-CM | POA: Diagnosis not present

## 2018-10-31 DIAGNOSIS — R457 State of emotional shock and stress, unspecified: Secondary | ICD-10-CM | POA: Diagnosis not present

## 2018-10-31 DIAGNOSIS — R569 Unspecified convulsions: Secondary | ICD-10-CM | POA: Diagnosis not present

## 2018-10-31 DIAGNOSIS — R404 Transient alteration of awareness: Secondary | ICD-10-CM | POA: Diagnosis not present

## 2018-10-31 NOTE — ED Notes (Signed)
Bed: TG25 Expected date:  Expected time:  Means of arrival:  Comments: EMS SZ

## 2018-10-31 NOTE — ED Triage Notes (Signed)
Per EMS, wife witnessed patient having an "absent" seizure-states staring off into space-wife's number [phone]

## 2018-10-31 NOTE — ED Provider Notes (Signed)
[place] DEPT Provider Note   CSN: [id number] Arrival date & time: 10/31/18  1638    History   Chief Complaint No chief complaint on file.   HPI Franklin [name] is a 38 y.o. male.     HPI Patient presents via EMS after an episode of seizure-like activity. Patient has a history of seizures, takes his medication, reportedly regularly. EMS reports the patient had an episode of witnessed seizure-like activity, described as consistent with an absence seizure, just prior to their notification. The patient self is awake and alert, states that he felt lightheaded, and then awoke, on his couch. This is typical for him during seizure-like activity, with prodrome similar to his lightheadedness from today He denies any pain anywhere, states that he feels slightly tired, but otherwise in his usual state of health, denies nausea, confusion, fever, states that he wants to go home.    Past Medical History:  Diagnosis Date  . Bipolar 1 disorder ([place])   . Chronic headaches   . Conversion disorder with seizures or convulsions   . 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 Depression   . Hx of electroencephalogram 12/2014   normal  . Mild cognitive impairment   . Psychogenic nonepileptic seizure   . Schizophrenic disorder ([place])   . Seizures [facility])     Patient Active Problem List   Diagnosis Date Noted  . Psychogenic nonepileptic seizure 02/18/2017  . Cholelithiasis 01/24/2017  . 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 Abnormal EKG 11/30/2015  . Elevated troponin 11/30/2015  . Bipolar affective disorder, most recent episode unspecified type, remission status unspecified 03/11/2015  . Bipolar affective disorder ([place]) 01/08/2015  . Generalized idiopathic epilepsy and epileptic syndromes, without status epilepticus, not intractable ([place]) 11/27/2014  . Seizure  disorder ([place]) 10/14/2014    No past surgical history on file.      Home Medications    Prior to Admission medications   Medication Sig Start Date End Date Taking? Authorizing Provider  acetaminophen (TYLENOL) 500 MG tablet Take 500-1,000 mg by mouth every 8 (eight) hours as needed (for pain or headaches).    [provider]  cariprazine (VRAYLAR) capsule Take 1.5 mg by mouth daily.     [provider]  meclizine (ANTIVERT) 25 MG tablet Take 1 tablet (25 mg total) by mouth 2 (two) times daily as needed for dizziness. Patient taking differently: Take 25 mg by mouth daily as needed for dizziness.  02/01/18   Jola Schmidt, MD  Oxcarbazepine (TRILEPTAL) 300 MG tablet Take 1/2 tablet twice a day 01/26/18   Argentina Donovan, PA-C    Family History Family History  Problem Relation Age of Onset  . Heart disease Mother     Social History Social History   Tobacco Use  . Smoking status: Never Smoker  . Smokeless tobacco: Never Used  Substance Use Topics  . Alcohol use: Not Currently    Alcohol/week: 0.0 standard drinks  . Drug use: No     Allergies   Coconut oil; Other; Codeine; Depakote [divalproex sodium]; Penicillins; Tape; and Latex   Review of Systems Review of Systems  Constitutional:       Per HPI, otherwise negative  HENT:       Per HPI, otherwise negative  Respiratory:       Per HPI, otherwise negative  Cardiovascular:       Per HPI, otherwise  negative  Gastrointestinal: Negative for vomiting.  Endocrine:       Negative aside from HPI  Genitourinary:       Neg aside from HPI   Musculoskeletal:       Per HPI, otherwise negative  Skin: Negative.   Neurological: Positive for seizures.     Physical Exam Updated Vital Signs BP 129/86 (BP Location: Left Arm)   Pulse (!) 54   Temp 98.4 F (36.9 C) (Oral)   Resp 15   SpO2 96%   Physical Exam Vitals signs and nursing note reviewed.  Constitutional:      General: He is not in acute  distress.    Appearance: He is well-developed.  HENT:     Head: Normocephalic and atraumatic.  Eyes:     Conjunctiva/sclera: Conjunctivae normal.  Cardiovascular:     Rate and Rhythm: Normal rate and regular rhythm.  Pulmonary:     Effort: Pulmonary effort is normal. No respiratory distress.     Breath sounds: No stridor.  Abdominal:     General: There is no distension.  Skin:    General: Skin is warm and dry.  Neurological:     General: No focal deficit present.     Mental Status: He is alert and oriented to person, place, and time.     Cranial Nerves: No cranial nerve deficit.      ED Treatments / Results   Procedures Procedures (including critical care time)  Medications Ordered in ED Medications - No data to display   Initial Impression / Assessment and Plan / ED Course  I have reviewed the triage vital signs and the nursing notes.  Pertinent labs & imaging results that were available during my care of the patient were reviewed by me and considered in my medical decision making (see chart for details).        5:11 PM Patient in no distress, awake, alert, sitting upright He is hemodynamically unremarkable. I have attempted to locate his significant other, walking outside in the parking lot, but she was not found. Similarly, the patient's phone number was wrong. This well-appearing young male with history of seizures presents after seizure-like episode. No evidence for trauma, no hemodynamic instability No complaints per Some suspicion, given his history on chart review of medication noncompliance With reassuring physical exam, vitals, history, the patient was discharged in stable condition.  Final Clinical Impressions(s) / ED Diagnoses   Final diagnoses:  Seizure-like activity [facility])     Carmin Muskrat, MD 10/31/18 1712

## 2018-10-31 NOTE — Discharge Instructions (Addendum)
As discussed, today's evaluation has been generally reassuring It is very important that you take all of your medication, including your antiseizure medication as directed. Return here for concerning changes in your condition.

## 2018-11-01 DIAGNOSIS — F3175 Bipolar disorder, in partial remission, most recent episode depressed: Secondary | ICD-10-CM | POA: Diagnosis not present

## 2018-11-02 DIAGNOSIS — F3175 Bipolar disorder, in partial remission, most recent episode depressed: Secondary | ICD-10-CM | POA: Diagnosis not present

## 2018-11-03 DIAGNOSIS — F3175 Bipolar disorder, in partial remission, most recent episode depressed: Secondary | ICD-10-CM | POA: Diagnosis not present

## 2018-11-04 DIAGNOSIS — F3175 Bipolar disorder, in partial remission, most recent episode depressed: Secondary | ICD-10-CM | POA: Diagnosis not present

## 2018-11-05 DIAGNOSIS — F3175 Bipolar disorder, in partial remission, most recent episode depressed: Secondary | ICD-10-CM | POA: Diagnosis not present

## 2018-11-06 DIAGNOSIS — F3175 Bipolar disorder, in partial remission, most recent episode depressed: Secondary | ICD-10-CM | POA: Diagnosis not present

## 2018-11-07 DIAGNOSIS — F3175 Bipolar disorder, in partial remission, most recent episode depressed: Secondary | ICD-10-CM | POA: Diagnosis not present

## 2018-11-08 DIAGNOSIS — F3175 Bipolar disorder, in partial remission, most recent episode depressed: Secondary | ICD-10-CM | POA: Diagnosis not present

## 2018-11-09 DIAGNOSIS — F3175 Bipolar disorder, in partial remission, most recent episode depressed: Secondary | ICD-10-CM | POA: Diagnosis not present

## 2018-11-13 DIAGNOSIS — F445 Conversion disorder with seizures or convulsions: Secondary | ICD-10-CM | POA: Diagnosis not present

## 2018-11-13 DIAGNOSIS — G43009 Migraine without aura, not intractable, without status migrainosus: Secondary | ICD-10-CM | POA: Diagnosis not present

## 2018-11-13 DIAGNOSIS — G40309 Generalized idiopathic epilepsy and epileptic syndromes, not intractable, without status epilepticus: Secondary | ICD-10-CM | POA: Diagnosis not present

## 2018-11-24 DIAGNOSIS — F3175 Bipolar disorder, in partial remission, most recent episode depressed: Secondary | ICD-10-CM | POA: Diagnosis not present

## 2018-11-25 DIAGNOSIS — F3175 Bipolar disorder, in partial remission, most recent episode depressed: Secondary | ICD-10-CM | POA: Diagnosis not present

## 2018-11-26 DIAGNOSIS — F3175 Bipolar disorder, in partial remission, most recent episode depressed: Secondary | ICD-10-CM | POA: Diagnosis not present

## 2018-11-27 DIAGNOSIS — F3175 Bipolar disorder, in partial remission, most recent episode depressed: Secondary | ICD-10-CM | POA: Diagnosis not present

## 2018-11-28 DIAGNOSIS — F3175 Bipolar disorder, in partial remission, most recent episode depressed: Secondary | ICD-10-CM | POA: Diagnosis not present

## 2018-11-29 DIAGNOSIS — F3175 Bipolar disorder, in partial remission, most recent episode depressed: Secondary | ICD-10-CM | POA: Diagnosis not present

## 2018-11-30 DIAGNOSIS — F3175 Bipolar disorder, in partial remission, most recent episode depressed: Secondary | ICD-10-CM | POA: Diagnosis not present

## 2018-12-01 DIAGNOSIS — F3175 Bipolar disorder, in partial remission, most recent episode depressed: Secondary | ICD-10-CM | POA: Diagnosis not present

## 2018-12-02 DIAGNOSIS — F3175 Bipolar disorder, in partial remission, most recent episode depressed: Secondary | ICD-10-CM | POA: Diagnosis not present

## 2018-12-03 DIAGNOSIS — F3175 Bipolar disorder, in partial remission, most recent episode depressed: Secondary | ICD-10-CM | POA: Diagnosis not present

## 2018-12-04 DIAGNOSIS — F3175 Bipolar disorder, in partial remission, most recent episode depressed: Secondary | ICD-10-CM | POA: Diagnosis not present

## 2018-12-05 DIAGNOSIS — F3175 Bipolar disorder, in partial remission, most recent episode depressed: Secondary | ICD-10-CM | POA: Diagnosis not present

## 2018-12-06 DIAGNOSIS — F3175 Bipolar disorder, in partial remission, most recent episode depressed: Secondary | ICD-10-CM | POA: Diagnosis not present

## 2018-12-07 DIAGNOSIS — F3175 Bipolar disorder, in partial remission, most recent episode depressed: Secondary | ICD-10-CM | POA: Diagnosis not present

## 2018-12-14 ENCOUNTER — Encounter (HOSPITAL_COMMUNITY): Payer: Self-pay

## 2018-12-14 ENCOUNTER — Emergency Department (HOSPITAL_COMMUNITY)
Admission: EM | Admit: 2018-12-14 | Discharge: 2018-12-14 | Disposition: A | Discharge status: Home / Self Care | Payer: Medicare HMO | Attending: Emergency Medicine | Admitting: Emergency Medicine

## 2018-12-14 ENCOUNTER — Other Ambulatory Visit: Payer: Self-pay

## 2018-12-14 DIAGNOSIS — Z79899 Other long term (current) drug therapy: Secondary | ICD-10-CM | POA: Diagnosis not present

## 2018-12-14 DIAGNOSIS — Y929 Unspecified place or not applicable: Secondary | ICD-10-CM | POA: Insufficient documentation

## 2018-12-14 DIAGNOSIS — Y999 Unspecified external cause status: Secondary | ICD-10-CM | POA: Insufficient documentation

## 2018-12-14 DIAGNOSIS — Z91040 Latex allergy status: Secondary | ICD-10-CM | POA: Diagnosis not present

## 2018-12-14 DIAGNOSIS — S3992XA Unspecified injury of lower back, initial encounter: Secondary | ICD-10-CM | POA: Diagnosis present

## 2018-12-14 DIAGNOSIS — M5489 Other dorsalgia: Secondary | ICD-10-CM | POA: Diagnosis not present

## 2018-12-14 DIAGNOSIS — S39012A Strain of muscle, fascia and tendon of lower back, initial encounter: Secondary | ICD-10-CM | POA: Diagnosis not present

## 2018-12-14 DIAGNOSIS — Y9389 Activity, other specified: Secondary | ICD-10-CM | POA: Insufficient documentation

## 2018-12-14 DIAGNOSIS — R52 Pain, unspecified: Secondary | ICD-10-CM | POA: Diagnosis not present

## 2018-12-14 DIAGNOSIS — X500XXA Overexertion from strenuous movement or load, initial encounter: Secondary | ICD-10-CM | POA: Insufficient documentation

## 2018-12-14 MED ORDER — DICLOFENAC SODIUM 1 % TD GEL: 2.0000 g | Freq: Four times a day (QID) | TRANSDERMAL | 0 refills | Status: DC

## 2018-12-14 MED ORDER — CYCLOBENZAPRINE HCL 10 MG PO TABS: 10.0000 mg | ORAL_TABLET | Freq: Two times a day (BID) | ORAL | 0 refills | Status: DC | PRN

## 2018-12-14 MED ORDER — KETOROLAC TROMETHAMINE 30 MG/ML IJ SOLN: 30.0000 mg | Freq: Once | INTRAMUSCULAR | Status: DC

## 2018-12-14 NOTE — Discharge Instructions (Signed)
Please apply Voltaren Gel to your lower back as needed for back pain.  Take Flexeril as needed if you develop tightness and/or muscle spasm.  Follow-up with orthopedist if your condition worsen.

## 2018-12-14 NOTE — ED Triage Notes (Signed)
Per EMS- Patient c/o feeling something pop in his lower back after moving furniture yesterday. Patient states he took Ibuprofen last night and today felt some better.  Today, the patient moved more furniture, felt pain and took ibuprofen 30 minutes prior to EMS arrival with no relief.

## 2018-12-14 NOTE — ED Provider Notes (Signed)
[facility] DEPT Provider Note   CSN: [id number] Arrival date & time: 12/14/18  1502     History   Chief Complaint Chief Complaint  Patient presents with  . Back Pain    HPI Franklin [name] is a 38 y.o. male.     The history is provided by the patient. No language interpreter was used.  Back Pain Associated symptoms: no fever and no numbness     38 year old male with history of bipolar, seizure, schizophrenia brought here via EMS for evaluation of back pain.  Patient report he was moving furniture yesterday and hurt his back.  He felt a pop to his low back follows with steady pain.  Pain is described as a sharp tightness sensation going across his back worsening with sitting and with movement.  Pain is moderate to severe and rates as 10 out of 10.  He took some ibuprofen last night which did help however today after moving furniture his pain returns.  He took additional ibuprofen but no relief.  He does not complain of any fever chills no radicular back pain no bowel bladder incontinence or saddle anesthesia.  No history of IV drug use or active cancer.  Past Medical History:  Diagnosis Date  . Bipolar 1 disorder ([place])   . Chronic headaches   . Conversion disorder with seizures or convulsions   . 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 Depression   . Hx of electroencephalogram 12/2014   normal  . Mild cognitive impairment   . Psychogenic nonepileptic seizure   . Schizophrenic disorder ([place])   . Seizures [facility])     Patient Active Problem List   Diagnosis Date Noted  . Psychogenic nonepileptic seizure 02/18/2017  . Cholelithiasis 01/24/2017  . 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 Abnormal EKG 11/30/2015  . Elevated troponin 11/30/2015  . Bipolar affective disorder, most recent episode unspecified type, remission status unspecified  03/11/2015  . Bipolar affective disorder ([place]) 01/08/2015  . Generalized idiopathic epilepsy and epileptic syndromes, without status epilepticus, not intractable ([place]) 11/27/2014  . Seizure disorder ([place]) 10/14/2014    No past surgical history on file.      Home Medications    Prior to Admission medications   Medication Sig Start Date End Date Taking? Authorizing Provider  acetaminophen (TYLENOL) 500 MG tablet Take 500-1,000 mg by mouth every 8 (eight) hours as needed (for pain or headaches).    [provider]  cariprazine (VRAYLAR) capsule Take 1.5 mg by mouth daily.     [provider]  meclizine (ANTIVERT) 25 MG tablet Take 1 tablet (25 mg total) by mouth 2 (two) times daily as needed for dizziness. Patient taking differently: Take 25 mg by mouth daily as needed for dizziness.  02/01/18   Jola Schmidt, MD  Oxcarbazepine (TRILEPTAL) 300 MG tablet Take 1/2 tablet twice a day 01/26/18   Argentina Donovan, PA-C    Family History Family History  Problem Relation Age of Onset  . Heart disease Mother     Social History Social History   Tobacco Use  . Smoking status: Never Smoker  . Smokeless tobacco: Never Used  Substance Use Topics  . Alcohol use: Not Currently    Alcohol/week: 0.0 standard drinks  . Drug use: No     Allergies   Coconut oil, Other, Codeine, Depakote [divalproex sodium], Penicillins, Tape, and Latex   Review  of Systems Review of Systems  Constitutional: Negative for fever.  Musculoskeletal: Positive for back pain.  Neurological: Negative for numbness.     Physical Exam Updated Vital Signs BP 125/71 (BP Location: Right Arm)   Temp 98.2 F (36.8 C) (Oral)   Resp 17   Ht 6' (1.829 m)   Wt 67.1 kg   SpO2 99%   BMI 20.07 kg/m   Physical Exam Vitals signs and nursing note reviewed.  Constitutional:      General: He is not in acute distress.    Appearance: He is well-developed.  HENT:     Head: Atraumatic.  Eyes:      Conjunctiva/sclera: Conjunctivae normal.  Neck:     Musculoskeletal: Neck supple.  Abdominal:     Palpations: Abdomen is soft.     Tenderness: There is no abdominal tenderness.  Musculoskeletal:        General: Tenderness (Tenderness along lumbar para lumbar spinal muscle on palpation without any significant crepitus step-off.  No overlying skin changes.  Able to ambulate.) present.  Skin:    Findings: No rash.  Neurological:     General: No focal deficit present.     Mental Status: He is alert.     Comments: Negative straight leg raise, 5 out of 5 strength to bilateral lower extremities with intact distal pedal pulses.      ED Treatments / Results  Labs (all labs ordered are listed, but only abnormal results are displayed) Labs Reviewed - No data to display  EKG None  Radiology No results found.  Procedures Procedures (including critical care time)  Medications Ordered in ED Medications  ketorolac (TORADOL) 30 MG/ML injection 30 mg (has no administration in time range)     Initial Impression / Assessment and Plan / ED Course  I have reviewed the triage vital signs and the nursing notes.  Pertinent labs & imaging results that were available during my care of the patient were reviewed by me and considered in my medical decision making (see chart for details).        BP 125/71 (BP Location: Right Arm)   Temp 98.2 F (36.8 C) (Oral)   Resp 17   Ht 6' (1.829 m)   Wt 67.1 kg   SpO2 99%   BMI 20.07 kg/m    Final Clinical Impressions(s) / ED Diagnoses   Final diagnoses:  Strain of lumbar region, initial encounter    ED Discharge Orders         Ordered    cyclobenzaprine (FLEXERIL) 10 MG tablet  2 times daily PRN     12/14/18 1530    diclofenac sodium (VOLTAREN) 1 % GEL  4 times daily     12/14/18 1530         3:25 PM Patient here with low back pain after lifting heavy furniture.  I suspect pain is musculoskeletal strain and low suspicion for acute  fracture or dislocation.  No red flags.  He is able to ambulate.  Will provide symptomatic treatment and right therapy.  Orthopedic referral given as needed.   Domenic Moras, PA-C 12/14/18 1534    Long, Wonda Old[place], MD 12/14/18 1924

## 2018-12-25 DIAGNOSIS — R1013 Epigastric pain: Secondary | ICD-10-CM | POA: Diagnosis not present

## 2018-12-25 DIAGNOSIS — E161 Other hypoglycemia: Secondary | ICD-10-CM | POA: Diagnosis not present

## 2018-12-25 DIAGNOSIS — E162 Hypoglycemia, unspecified: Secondary | ICD-10-CM | POA: Diagnosis not present

## 2018-12-25 DIAGNOSIS — R569 Unspecified convulsions: Secondary | ICD-10-CM | POA: Diagnosis not present

## 2018-12-25 DIAGNOSIS — R404 Transient alteration of awareness: Secondary | ICD-10-CM | POA: Diagnosis not present

## 2018-12-27 ENCOUNTER — Encounter (HOSPITAL_COMMUNITY): Payer: Self-pay | Admitting: Emergency Medicine

## 2018-12-27 ENCOUNTER — Emergency Department (HOSPITAL_COMMUNITY)
Admission: EM | Admit: 2018-12-27 | Discharge: 2018-12-27 | Discharge status: Against Medical Advice | Payer: Medicare HMO | Attending: Emergency Medicine | Admitting: Emergency Medicine

## 2018-12-27 ENCOUNTER — Emergency Department (HOSPITAL_COMMUNITY): Payer: Medicare HMO

## 2018-12-27 ENCOUNTER — Other Ambulatory Visit: Payer: Self-pay

## 2018-12-27 DIAGNOSIS — R0789 Other chest pain: Secondary | ICD-10-CM | POA: Diagnosis present

## 2018-12-27 DIAGNOSIS — R0902 Hypoxemia: Secondary | ICD-10-CM | POA: Diagnosis not present

## 2018-12-27 DIAGNOSIS — R079 Chest pain, unspecified: Secondary | ICD-10-CM | POA: Diagnosis not present

## 2018-12-27 DIAGNOSIS — Z5321 Procedure and treatment not carried out due to patient leaving prior to being seen by health care provider: Secondary | ICD-10-CM | POA: Insufficient documentation

## 2018-12-27 DIAGNOSIS — R569 Unspecified convulsions: Secondary | ICD-10-CM | POA: Diagnosis not present

## 2018-12-27 LAB — TROPONIN I (HIGH SENSITIVITY): Troponin I (High Sensitivity): 3 ng/L (ref <18)

## 2018-12-27 LAB — BASIC METABOLIC PANEL
Anion gap: 10 (ref 5–15)
BUN: 11 mg/dL (ref 6–20)
CO2: 25 mmol/L (ref 22–32)
Calcium: 9.4 mg/dL (ref 8.9–10.3)
Chloride: 107 mmol/L (ref 98–111)
Creatinine, Ser: 1.07 mg/dL (ref 0.61–1.24)
GFR calc Af Amer: >60 mL/min (ref >60)
GFR calc non Af Amer: >60 mL/min (ref >60)
Glucose, Bld: 81 mg/dL (ref 70–99)
Potassium: 4.1 mmol/L (ref 3.5–5.1)
Sodium: 142 mmol/L (ref 135–145)

## 2018-12-27 LAB — CBC
HCT: 43.7 % (ref 39.0–52.0)
Hemoglobin: 14.8 g/dL (ref 13.0–17.0)
MCH: 30.3 pg (ref 26.0–34.0)
MCHC: 33.9 g/dL (ref 30.0–36.0)
MCV: 89.4 fL (ref 80.0–100.0)
Platelets: 196 10*3/uL (ref 150–400)
RBC: 4.89 MIL/uL (ref 4.22–5.81)
RDW: 11.9 % (ref 11.5–15.5)
WBC: 5.7 10*3/uL (ref 4.0–10.5)
nRBC: 0 % (ref 0.0–0.2)

## 2018-12-27 MED ORDER — SODIUM CHLORIDE 0.9% FLUSH: 3.0000 mL | Freq: Once | INTRAVENOUS | Status: DC

## 2018-12-27 NOTE — ED Notes (Signed)
Pt called for repeat vitals, no answer 

## 2018-12-27 NOTE — ED Triage Notes (Signed)
Pt was outside talking to a friend about 30 minutes ago when he suddenly developed cp in the center of his cp no sob or other sxs. Ems gave pt asa

## 2018-12-27 NOTE — ED Notes (Signed)
Pt called three times, no answer.  

## 2019-01-03 DIAGNOSIS — F250 Schizoaffective disorder, bipolar type: Secondary | ICD-10-CM | POA: Diagnosis not present

## 2019-01-04 ENCOUNTER — Other Ambulatory Visit: Payer: Self-pay

## 2019-01-04 ENCOUNTER — Emergency Department (HOSPITAL_COMMUNITY)
Admission: EM | Admit: 2019-01-04 | Discharge: 2019-01-04 | Disposition: A | Discharge status: Home / Self Care | Payer: Medicare HMO | Attending: Emergency Medicine | Admitting: Emergency Medicine

## 2019-01-04 ENCOUNTER — Encounter (HOSPITAL_COMMUNITY): Payer: Self-pay

## 2019-01-04 DIAGNOSIS — W19XXXA Unspecified fall, initial encounter: Secondary | ICD-10-CM | POA: Diagnosis not present

## 2019-01-04 DIAGNOSIS — R569 Unspecified convulsions: Secondary | ICD-10-CM | POA: Diagnosis not present

## 2019-01-04 DIAGNOSIS — Z5321 Procedure and treatment not carried out due to patient leaving prior to being seen by health care provider: Secondary | ICD-10-CM | POA: Diagnosis not present

## 2019-01-04 DIAGNOSIS — R404 Transient alteration of awareness: Secondary | ICD-10-CM | POA: Diagnosis not present

## 2019-01-04 DIAGNOSIS — I10 Essential (primary) hypertension: Secondary | ICD-10-CM | POA: Diagnosis not present

## 2019-01-04 NOTE — ED Notes (Signed)
No answer x2 for vitals

## 2019-01-04 NOTE — ED Notes (Signed)
No answer x3 for vitals.  Pt not visualized in waiting room

## 2019-01-04 NOTE — ED Triage Notes (Signed)
Pt BIB [facility] for eval of seizure like activity today. EMS reports that pt may have had unwitnessed fall/seizure in bathroom and struck head. Small hematoma to back of head, pt does not recall event. EMS reports some focal L arm shaking on their arrival, which wife reported could be his usual seizure activity. Per EMS pt has known hx of tonic clonic seizures. Reports compliance w/ trileptal

## 2019-01-12 DIAGNOSIS — I4510 Unspecified right bundle-branch block: Secondary | ICD-10-CM | POA: Diagnosis not present

## 2019-01-12 DIAGNOSIS — R0602 Shortness of breath: Secondary | ICD-10-CM | POA: Diagnosis not present

## 2019-01-12 DIAGNOSIS — R42 Dizziness and giddiness: Secondary | ICD-10-CM | POA: Diagnosis not present

## 2019-01-12 DIAGNOSIS — R51 Headache: Secondary | ICD-10-CM | POA: Diagnosis not present

## 2019-01-12 DIAGNOSIS — R079 Chest pain, unspecified: Secondary | ICD-10-CM | POA: Diagnosis not present

## 2019-01-17 ENCOUNTER — Emergency Department (HOSPITAL_COMMUNITY): Payer: Medicare HMO

## 2019-01-17 ENCOUNTER — Emergency Department (HOSPITAL_COMMUNITY)
Admission: EM | Admit: 2019-01-17 | Discharge: 2019-01-17 | Discharge status: Against Medical Advice | Payer: Medicare HMO | Attending: Emergency Medicine | Admitting: Emergency Medicine

## 2019-01-17 ENCOUNTER — Encounter (HOSPITAL_COMMUNITY): Payer: Self-pay

## 2019-01-17 DIAGNOSIS — Z5321 Procedure and treatment not carried out due to patient leaving prior to being seen by health care provider: Secondary | ICD-10-CM | POA: Insufficient documentation

## 2019-01-17 DIAGNOSIS — I10 Essential (primary) hypertension: Secondary | ICD-10-CM | POA: Diagnosis not present

## 2019-01-17 DIAGNOSIS — I959 Hypotension, unspecified: Secondary | ICD-10-CM | POA: Diagnosis not present

## 2019-01-17 DIAGNOSIS — R0789 Other chest pain: Secondary | ICD-10-CM | POA: Diagnosis present

## 2019-01-17 DIAGNOSIS — R0602 Shortness of breath: Secondary | ICD-10-CM | POA: Diagnosis not present

## 2019-01-17 DIAGNOSIS — R079 Chest pain, unspecified: Secondary | ICD-10-CM | POA: Diagnosis not present

## 2019-01-17 LAB — CBC
HCT: 44.3 % (ref 39.0–52.0)
Hemoglobin: 15.3 g/dL (ref 13.0–17.0)
MCH: 30.3 pg (ref 26.0–34.0)
MCHC: 34.5 g/dL (ref 30.0–36.0)
MCV: 87.7 fL (ref 80.0–100.0)
Platelets: 217 10*3/uL (ref 150–400)
RBC: 5.05 MIL/uL (ref 4.22–5.81)
RDW: 12.2 % (ref 11.5–15.5)
WBC: 6.3 10*3/uL (ref 4.0–10.5)
nRBC: 0 % (ref 0.0–0.2)

## 2019-01-17 LAB — BASIC METABOLIC PANEL
Anion gap: 10 (ref 5–15)
BUN: 9 mg/dL (ref 6–20)
CO2: 26 mmol/L (ref 22–32)
Calcium: 9.5 mg/dL (ref 8.9–10.3)
Chloride: 103 mmol/L (ref 98–111)
Creatinine, Ser: 0.95 mg/dL (ref 0.61–1.24)
GFR calc Af Amer: >60 mL/min (ref >60)
GFR calc non Af Amer: >60 mL/min (ref >60)
Glucose, Bld: 83 mg/dL (ref 70–99)
Potassium: 4.2 mmol/L (ref 3.5–5.1)
Sodium: 139 mmol/L (ref 135–145)

## 2019-01-17 LAB — TROPONIN I (HIGH SENSITIVITY): Troponin I (High Sensitivity): 4 ng/L (ref <18)

## 2019-01-17 MED ORDER — SODIUM CHLORIDE 0.9% FLUSH: 3.0000 mL | Freq: Once | INTRAVENOUS | Status: DC

## 2019-01-17 NOTE — ED Triage Notes (Signed)
Pt comes via [facility] for CP with SOB, took 324 ASA PTA

## 2019-01-17 NOTE — ED Notes (Signed)
Pt called for xray @ 2100, 2200, and 2330 with no answer. Called by this nurse x 3 with no response.

## 2019-01-23 ENCOUNTER — Emergency Department (HOSPITAL_COMMUNITY)
Admission: EM | Admit: 2019-01-23 | Discharge: 2019-01-23 | Disposition: A | Discharge status: Home / Self Care | Payer: Medicare HMO | Attending: Emergency Medicine | Admitting: Emergency Medicine

## 2019-01-23 ENCOUNTER — Encounter (HOSPITAL_COMMUNITY): Payer: Self-pay

## 2019-01-23 ENCOUNTER — Other Ambulatory Visit: Payer: Self-pay

## 2019-01-23 DIAGNOSIS — Z791 Long term (current) use of non-steroidal anti-inflammatories (NSAID): Secondary | ICD-10-CM | POA: Diagnosis not present

## 2019-01-23 DIAGNOSIS — R55 Syncope and collapse: Secondary | ICD-10-CM | POA: Diagnosis not present

## 2019-01-23 DIAGNOSIS — G40909 Epilepsy, unspecified, not intractable, without status epilepticus: Secondary | ICD-10-CM | POA: Diagnosis not present

## 2019-01-23 DIAGNOSIS — Z91040 Latex allergy status: Secondary | ICD-10-CM | POA: Insufficient documentation

## 2019-01-23 DIAGNOSIS — R410 Disorientation, unspecified: Secondary | ICD-10-CM | POA: Diagnosis not present

## 2019-01-23 DIAGNOSIS — R569 Unspecified convulsions: Secondary | ICD-10-CM | POA: Diagnosis not present

## 2019-01-23 DIAGNOSIS — R456 Violent behavior: Secondary | ICD-10-CM | POA: Diagnosis not present

## 2019-01-23 DIAGNOSIS — Z79899 Other long term (current) drug therapy: Secondary | ICD-10-CM | POA: Insufficient documentation

## 2019-01-23 DIAGNOSIS — R0902 Hypoxemia: Secondary | ICD-10-CM | POA: Diagnosis not present

## 2019-01-23 LAB — CBG MONITORING, ED: Glucose-Capillary: 104 mg/dL — ABNORMAL HIGH (ref 70–99)

## 2019-01-23 NOTE — ED Triage Notes (Signed)
Per [facility], pt was Sitting outside with friends and had witnessed syncopal episode without seizure activity. Pt has hx of seizures. After pt regained consciousness pt was briefly combative that appeared consistent with pt's normal postictal behavior. Axox4. 162/100, HR 86, 94% on RA. Pt refused CBG or temp check.

## 2019-01-23 NOTE — ED Notes (Signed)
Pt verbalized understanding of d/c instructions and has no further questions, VSS, NAD.  

## 2019-01-23 NOTE — ED Provider Notes (Signed)
[place] EMERGENCY DEPARTMENT Provider Note   CSN: [id number] Arrival date & time: 01/23/19  1719     History   Chief Complaint Chief Complaint  Patient presents with  . Loss of Consciousness    HPI Franklin [name] is a 38 y.o. male.     38 year old male with past medical history including bipolar disorder, seizure disorder, conversion disorder who presents with loss of consciousness.  Just prior to arrival, the patient was sitting outside with friends when he had a witnessed syncopal-like episode.  They did not note any seizure activity however when he regained consciousness he was briefly combative.  He has since become alert and oriented, back to baseline.  He refused CBG by EMS.  Patient reports to me that he has been in his usual state of health with no recent illness.  He states he feels fine now and wants to go home.  He denies any drug or alcohol use.  He is compliant with his medications.  The history is provided by the patient.    Past Medical History:  Diagnosis Date  . Bipolar 1 disorder ([place])   . Chronic headaches   . Conversion disorder with seizures or convulsions   . 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 Depression   . Hx of electroencephalogram 12/2014   normal  . Mild cognitive impairment   . Psychogenic nonepileptic seizure   . Schizophrenic disorder ([place])   . Seizures [facility])     Patient Active Problem List   Diagnosis Date Noted  . Psychogenic nonepileptic seizure 02/18/2017  . Cholelithiasis 01/24/2017  . 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 Abnormal EKG 11/30/2015  . Elevated troponin 11/30/2015  . Bipolar affective disorder, most recent episode unspecified type, remission status unspecified 03/11/2015  . Bipolar affective disorder ([place]) 01/08/2015  . Generalized idiopathic epilepsy and epileptic syndromes, without  status epilepticus, not intractable ([place]) 11/27/2014  . Seizure disorder ([place]) 10/14/2014    History reviewed. No pertinent surgical history.      Home Medications    Prior to Admission medications   Medication Sig Start Date End Date Taking? Authorizing Provider  acetaminophen (TYLENOL) 500 MG tablet Take 500-1,000 mg by mouth every 8 (eight) hours as needed (for pain or headaches).    [provider]  cariprazine (VRAYLAR) capsule Take 1.5 mg by mouth daily.     [provider]  cyclobenzaprine (FLEXERIL) 10 MG tablet Take 1 tablet (10 mg total) by mouth 2 (two) times daily as needed for muscle spasms. 12/14/18   Domenic Moras, PA-C  diclofenac sodium (VOLTAREN) 1 % GEL Apply 2 g topically 4 (four) times daily. 12/14/18   Domenic Moras, PA-C  meclizine (ANTIVERT) 25 MG tablet Take 1 tablet (25 mg total) by mouth 2 (two) times daily as needed for dizziness. Patient taking differently: Take 25 mg by mouth daily as needed for dizziness.  02/01/18   Jola Schmidt, MD  Oxcarbazepine (TRILEPTAL) 300 MG tablet Take 1/2 tablet twice a day 01/26/18   Argentina Donovan, PA-C    Family History Family History  Problem Relation Age of Onset  . Heart disease Mother     Social History Social History   Tobacco Use  . Smoking status: Never Smoker  . Smokeless tobacco: Never Used  Substance Use Topics  . Alcohol use: Not Currently    Alcohol/week: 0.0 standard drinks  .  Drug use: No     Allergies   Coconut oil, Other, Codeine, Depakote [divalproex sodium], Penicillins, Tape, and Latex   Review of Systems Review of Systems All other systems reviewed and are negative except that which was mentioned in HPI   Physical Exam Updated Vital Signs BP 140/87 (BP Location: Right Arm)   Pulse 61   Temp 98.6 F (37 C) (Oral)   Resp 16   SpO2 98%   Physical Exam Vitals signs and nursing note reviewed.  Constitutional:      General: He is not in acute distress.    Appearance:  He is well-developed.     Comments: Awake, alert  HENT:     Head: Normocephalic and atraumatic.  Eyes:     Extraocular Movements: Extraocular movements intact.     Conjunctiva/sclera: Conjunctivae normal.     Pupils: Pupils are equal, round, and reactive to light.  Neck:     Musculoskeletal: Neck supple.  Cardiovascular:     Rate and Rhythm: Normal rate and regular rhythm.     Heart sounds: Normal heart sounds. No murmur.  Pulmonary:     Effort: Pulmonary effort is normal. No respiratory distress.     Breath sounds: Normal breath sounds.  Abdominal:     General: Bowel sounds are normal. There is no distension.     Palpations: Abdomen is soft.     Tenderness: There is no abdominal tenderness.  Skin:    General: Skin is warm and dry.  Neurological:     Mental Status: He is alert and oriented to person, place, and time.     Cranial Nerves: No cranial nerve deficit.     Motor: No abnormal muscle tone.     Deep Tendon Reflexes: Reflexes are normal and symmetric.     Comments: Fluent speech, normal finger-to-nose testing, negative pronator drift, no clonus 5/5 strength and normal sensation x all 4 extremities  Psychiatric:     Comments: Bizarre affect, good eye contact, cooperative      ED Treatments / Results  Labs (all labs ordered are listed, but only abnormal results are displayed) Labs Reviewed  CBG MONITORING, ED - Abnormal; Notable for the following components:      Result Value   Glucose-Capillary 104 (*)    All other components within normal limits    EKG EKG Interpretation  Date/Time:  Tuesday January 23 2019 17:27:11 EDT Ventricular Rate:  64 PR Interval:    QRS Duration: 124 QT Interval:  397 QTC Calculation: 410 R Axis:   -36 Text Interpretation:  Sinus rhythm Nonspecific IVCD with LAD No significant change since last tracing Confirmed by Theotis Burrow [id number]) on 01/23/2019 5:43:13 PM   Radiology No results found.  Procedures Procedures (including  critical care time)  Medications Ordered in ED Medications - No data to display   Initial Impression / Assessment and Plan / ED Course  I have reviewed the triage vital signs and the nursing notes.  Pertinent labs that were available during my care of the patient were reviewed by me and considered in my medical decision making (see chart for details).       Neuro intact on exam, reassuring VS. EKG unchanged from previous. CBG normal. Pt refused any further testing stating he feels fine.  I reviewed recent ED evaluation for similar episodes and reviewed his most recent neurology clinic visit.  Some concern for possible nonepileptic seizures.  He was recommended to have MRI and EEG but has not  done this yet.  I emphasized the importance of having these scheduled and following up with his outpatient neurologist.  Instructed to continue current medication regimen.  Return precautions reviewed.  Final Clinical Impressions(s) / ED Diagnoses   Final diagnoses:  Seizure-like activity [facility])    ED Discharge Orders    None       Kenetra Hildenbrand, Wenda [place], MD 01/23/19 1800

## 2019-01-23 NOTE — Discharge Instructions (Addendum)
PLEASE CONTACT YOUR NEUROLOGIST TO SCHEDULE A FOLLOW UP APPOINTMENT. SHE CAN ARRANGE FOR AN EEG. KEEP TAKING ALL MEDICATIONS AS PRESCRIBED. RETURN TO ER IF ANY SUDDEN CHANGES IN SYMPTOMS.

## 2019-02-15 DIAGNOSIS — G43009 Migraine without aura, not intractable, without status migrainosus: Secondary | ICD-10-CM | POA: Diagnosis not present

## 2019-02-15 DIAGNOSIS — G40309 Generalized idiopathic epilepsy and epileptic syndromes, not intractable, without status epilepticus: Secondary | ICD-10-CM | POA: Diagnosis not present

## 2019-02-15 DIAGNOSIS — F445 Conversion disorder with seizures or convulsions: Secondary | ICD-10-CM | POA: Diagnosis not present

## 2019-02-16 DIAGNOSIS — R457 State of emotional shock and stress, unspecified: Secondary | ICD-10-CM | POA: Diagnosis not present

## 2019-02-16 DIAGNOSIS — I10 Essential (primary) hypertension: Secondary | ICD-10-CM | POA: Diagnosis not present

## 2019-02-16 DIAGNOSIS — R079 Chest pain, unspecified: Secondary | ICD-10-CM | POA: Diagnosis not present

## 2019-02-16 DIAGNOSIS — R064 Hyperventilation: Secondary | ICD-10-CM | POA: Diagnosis not present

## 2019-02-19 DIAGNOSIS — F250 Schizoaffective disorder, bipolar type: Secondary | ICD-10-CM | POA: Diagnosis not present

## 2019-02-21 ENCOUNTER — Encounter: Payer: Medicare HMO | Admitting: Family Medicine

## 2019-02-23 ENCOUNTER — Other Ambulatory Visit: Payer: Self-pay

## 2019-02-23 DIAGNOSIS — R6889 Other general symptoms and signs: Secondary | ICD-10-CM | POA: Diagnosis not present

## 2019-02-23 DIAGNOSIS — Z20822 Contact with and (suspected) exposure to covid-19: Secondary | ICD-10-CM

## 2019-02-24 LAB — NOVEL CORONAVIRUS, NAA: SARS-CoV-2, NAA: NOT DETECTED

## 2019-02-26 DIAGNOSIS — R456 Violent behavior: Secondary | ICD-10-CM | POA: Diagnosis not present

## 2019-02-26 DIAGNOSIS — R4020 Unspecified coma: Secondary | ICD-10-CM | POA: Diagnosis not present

## 2019-02-26 DIAGNOSIS — R569 Unspecified convulsions: Secondary | ICD-10-CM | POA: Diagnosis not present

## 2019-03-03 ENCOUNTER — Encounter (HOSPITAL_COMMUNITY): Payer: Self-pay | Admitting: Oncology

## 2019-03-03 ENCOUNTER — Other Ambulatory Visit: Payer: Self-pay

## 2019-03-03 ENCOUNTER — Emergency Department (HOSPITAL_COMMUNITY)
Admission: EM | Admit: 2019-03-03 | Discharge: 2019-03-03 | Disposition: A | Discharge status: Home / Self Care | Payer: Medicare HMO | Attending: Emergency Medicine | Admitting: Emergency Medicine

## 2019-03-03 DIAGNOSIS — Z79899 Other long term (current) drug therapy: Secondary | ICD-10-CM | POA: Insufficient documentation

## 2019-03-03 DIAGNOSIS — G40901 Epilepsy, unspecified, not intractable, with status epilepticus: Secondary | ICD-10-CM | POA: Diagnosis not present

## 2019-03-03 DIAGNOSIS — R569 Unspecified convulsions: Secondary | ICD-10-CM | POA: Insufficient documentation

## 2019-03-03 DIAGNOSIS — G40909 Epilepsy, unspecified, not intractable, without status epilepticus: Secondary | ICD-10-CM | POA: Diagnosis not present

## 2019-03-03 DIAGNOSIS — Z91040 Latex allergy status: Secondary | ICD-10-CM | POA: Diagnosis not present

## 2019-03-03 LAB — CBG MONITORING, ED: Glucose-Capillary: 88 mg/dL (ref 70–99)

## 2019-03-03 NOTE — ED Triage Notes (Signed)
Pt bib [facility] from home d/t 6 seizures in one hour.  Pt has hx of seizures. Recent change in seizure medication reported by pt.  Pt is A&O x 4.  EMS reports after first seizure pt had an approximately 20 minute post ictal state.

## 2019-03-03 NOTE — ED Notes (Signed)
Pt refusing wheelchair out of ED. States he would prefer to ambulate.

## 2019-03-03 NOTE — ED Provider Notes (Signed)
[facility] EMERGENCY DEPARTMENT Provider Note   CSN: [id number] Arrival date & time: 03/03/19  2055     History   Chief Complaint Chief Complaint  Patient presents with  . Seizures    HPI Franklin [name] is a 38 y.o. male.     The history is provided by the patient and medical records. No language interpreter was used.  Seizures Seizure activity on arrival: no   Seizure type:  Grand mal Initial focality:  None Postictal symptoms: confusion   Return to baseline: yes   Severity:  Mild Timing:  Clustered Number of seizures this episode:  4 Progression:  Resolved Context: change in medication   Context: not alcohol withdrawal, not cerebral palsy and not fever   Recent head injury:  No recent head injuries PTA treatment:  None   Past Medical History:  Diagnosis Date  . Bipolar 1 disorder ([place])   . Chronic headaches   . Conversion disorder with seizures or convulsions   . 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 Depression   . Hx of electroencephalogram 12/2014   normal  . Mild cognitive impairment   . Psychogenic nonepileptic seizure   . Schizophrenic disorder ([place])   . Seizures [facility])     Patient Active Problem List   Diagnosis Date Noted  . Psychogenic nonepileptic seizure 02/18/2017  . Cholelithiasis 01/24/2017  . 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 Abnormal EKG 11/30/2015  . Elevated troponin 11/30/2015  . Bipolar affective disorder, most recent episode unspecified type, remission status unspecified 03/11/2015  . Bipolar affective disorder ([place]) 01/08/2015  . Generalized idiopathic epilepsy and epileptic syndromes, without status epilepticus, not intractable ([place]) 11/27/2014  . Seizure disorder ([place]) 10/14/2014    History reviewed. No pertinent surgical history.      Home Medications    Prior to Admission medications    Medication Sig Start Date End Date Taking? Authorizing Provider  acetaminophen (TYLENOL) 500 MG tablet Take 500-1,000 mg by mouth every 8 (eight) hours as needed (for pain or headaches).    [provider]  cariprazine (VRAYLAR) capsule Take 1.5 mg by mouth daily.     [provider]  cyclobenzaprine (FLEXERIL) 10 MG tablet Take 1 tablet (10 mg total) by mouth 2 (two) times daily as needed for muscle spasms. 12/14/18   Domenic Moras, PA-C  diclofenac sodium (VOLTAREN) 1 % GEL Apply 2 g topically 4 (four) times daily. 12/14/18   Domenic Moras, PA-C  meclizine (ANTIVERT) 25 MG tablet Take 1 tablet (25 mg total) by mouth 2 (two) times daily as needed for dizziness. Patient taking differently: Take 25 mg by mouth daily as needed for dizziness.  02/01/18   Jola Schmidt, MD  Oxcarbazepine (TRILEPTAL) 300 MG tablet Take 1/2 tablet twice a day 01/26/18   Argentina Donovan, PA-C    Family History Family History  Problem Relation Age of Onset  . Heart disease Mother     Social History Social History   Tobacco Use  . Smoking status: Never Smoker  . Smokeless tobacco: Never Used  Substance Use Topics  . Alcohol use: Not Currently    Alcohol/week: 0.0 standard drinks  . Drug use: No     Allergies   Coconut oil, Other, Codeine, Depakote [divalproex sodium], Penicillins, Tape, and Latex   Review of Systems Review of Systems  Constitutional: Negative for chills, diaphoresis, fatigue and fever.  HENT: Negative for congestion, ear pain and sore throat.   Eyes: Negative for pain and visual disturbance.  Respiratory: Negative for cough, chest tightness and shortness of breath.   Cardiovascular: Negative for chest pain and palpitations.  Gastrointestinal: Negative for abdominal pain and vomiting.  Genitourinary: Negative for dysuria and hematuria.  Musculoskeletal: Negative for arthralgias, back pain, neck pain and neck stiffness.  Skin: Negative for color change and rash.   Neurological: Positive for seizures. Negative for dizziness, syncope, facial asymmetry, weakness, light-headedness and headaches.  Psychiatric/Behavioral: Negative for agitation.  All other systems reviewed and are negative.    Physical Exam Updated Vital Signs BP (!) 133/104   Pulse (!) 55   Temp 98.6 F (37 C) (Oral)   Resp 15   Ht 6' (1.829 m)   Wt 68 kg   SpO2 100%   BMI 20.34 kg/m   Physical Exam Vitals signs and nursing note reviewed.  Constitutional:      General: He is not in acute distress.    Appearance: He is well-developed. He is not ill-appearing, toxic-appearing or diaphoretic.  HENT:     Head: Normocephalic and atraumatic.     Nose: No congestion or rhinorrhea.  Eyes:     Conjunctiva/sclera: Conjunctivae normal.  Neck:     Musculoskeletal: Neck supple.  Cardiovascular:     Rate and Rhythm: Normal rate and regular rhythm.     Heart sounds: No murmur.  Pulmonary:     Effort: Pulmonary effort is normal. No respiratory distress.     Breath sounds: Normal breath sounds. No wheezing, rhonchi or rales.  Chest:     Chest wall: No tenderness.  Abdominal:     Palpations: Abdomen is soft.     Tenderness: There is no abdominal tenderness.  Musculoskeletal:        General: No tenderness.  Skin:    General: Skin is warm and dry.     Capillary Refill: Capillary refill takes less than 2 seconds.     Findings: No bruising, erythema, lesion or rash.  Neurological:     General: No focal deficit present.     Mental Status: He is alert and oriented to person, place, and time.     Cranial Nerves: No cranial nerve deficit.     Sensory: No sensory deficit.     Motor: No weakness.     Coordination: Coordination normal.  Psychiatric:        Mood and Affect: Mood normal.      ED Treatments / Results  Labs (all labs ordered are listed, but only abnormal results are displayed) Labs Reviewed  CBG MONITORING, ED    EKG None  Radiology No results found.   Procedures Procedures (including critical care time)  Medications Ordered in ED Medications - No data to display   Initial Impression / Assessment and Plan / ED Course  I have reviewed the triage vital signs and the nursing notes.  Pertinent labs & imaging results that were available during my care of the patient were reviewed by me and considered in my medical decision making (see chart for details).        Franklin [name] is a 38 y.o. male with a past medical history significant for seizure disorder, bipolar disorder, schizophrenia, conversion disorder, and history of non-epileptiform seizures who presents with several seizures today.  He reports that he recently has had his seizure medication changes and they are increasing it.  Words he has an EEG and appointment scheduled  with his neurologist later this week.  He reports that he has been several week since he had numerous seizures but today had 4 or 5 he thinks.  He reports he was postictal.  He comes into the emergency department for evaluation.  He denies any fevers, chills, headache, neck pain, neck stiffness, urinary symptoms or GI symptoms.  He reports feeling well and denies drug or alcohol use.  Denies other changes.  On exam, no focal neurologic deficits.  Patient resting comfortably.  Lungs clear chest and abdomen nontender.  No evidence of trauma.  He denies trauma.  Patient was offered labs to further evaluate if there is a different cause then just recent medication changes for his epilepsy worsening.  Patient reports he does not want to stay and have any blood work or work-up done he would rather go home since he is feeling better.  He understands that he needs to follow-up with his neurologist this week and have his EEG.  He did not want further work-up and understands that he could have further seizures tonight that could be very detrimental.  Patient understands these risks and wants to go home.  He was discharged in good  condition with no seizures in the emergency department and appearing well.  Final Clinical Impressions(s) / ED Diagnoses   Final diagnoses:  Seizure [facility])    ED Discharge Orders    None      Clinical Impression: 1. Seizure [facility])     Disposition: Discharge  Condition: Good  I have discussed the results, Dx and Tx plan with the pt(& family if present). He/she/they expressed understanding and agree(s) with the plan. Discharge instructions discussed at great length. Strict return precautions discussed and pt &/or family have verbalized understanding of the instructions. No further questions at time of discharge.    Discharge Medication List as of 03/03/2019  9:54 PM      Follow Up: your neurologist     [place] [address] [id number] mc [place]       Tegeler, Gwenyth Allegra, MD 03/04/19 [id number]

## 2019-03-03 NOTE — Discharge Instructions (Signed)
Your history and exam are consistent with breakthrough seizures today.  As we discussed, you have your EEG scheduled for later this week with your neurology team to see you.  As you want to go home and do not want further work-up, we discussed the risks and benefits of this.  You understand that you could have further seizures and further complications if you leave without further work-up.  As you would like to go home, we are going to have you follow-up as directed.  If any symptoms change or worsen, return to the emergency department.

## 2019-03-07 DIAGNOSIS — R531 Weakness: Secondary | ICD-10-CM | POA: Diagnosis not present

## 2019-03-07 DIAGNOSIS — G40309 Generalized idiopathic epilepsy and epileptic syndromes, not intractable, without status epilepticus: Secondary | ICD-10-CM | POA: Diagnosis not present

## 2019-03-11 ENCOUNTER — Other Ambulatory Visit: Payer: Self-pay

## 2019-03-11 ENCOUNTER — Emergency Department (HOSPITAL_COMMUNITY)
Admission: EM | Admit: 2019-03-11 | Discharge: 2019-03-11 | Disposition: A | Discharge status: Home / Self Care | Payer: Medicare HMO | Attending: Emergency Medicine | Admitting: Emergency Medicine

## 2019-03-11 DIAGNOSIS — R52 Pain, unspecified: Secondary | ICD-10-CM

## 2019-03-11 DIAGNOSIS — Z91040 Latex allergy status: Secondary | ICD-10-CM | POA: Insufficient documentation

## 2019-03-11 DIAGNOSIS — R569 Unspecified convulsions: Secondary | ICD-10-CM | POA: Insufficient documentation

## 2019-03-11 DIAGNOSIS — M791 Myalgia, unspecified site: Secondary | ICD-10-CM | POA: Insufficient documentation

## 2019-03-11 DIAGNOSIS — Z79899 Other long term (current) drug therapy: Secondary | ICD-10-CM | POA: Diagnosis not present

## 2019-03-11 DIAGNOSIS — M7918 Myalgia, other site: Secondary | ICD-10-CM | POA: Diagnosis not present

## 2019-03-11 DIAGNOSIS — R531 Weakness: Secondary | ICD-10-CM | POA: Insufficient documentation

## 2019-03-11 DIAGNOSIS — G40909 Epilepsy, unspecified, not intractable, without status epilepticus: Secondary | ICD-10-CM | POA: Diagnosis not present

## 2019-03-11 LAB — BASIC METABOLIC PANEL
Anion gap: 7 (ref 5–15)
BUN: 12 mg/dL (ref 6–20)
CO2: 27 mmol/L (ref 22–32)
Calcium: 9.4 mg/dL (ref 8.9–10.3)
Chloride: 102 mmol/L (ref 98–111)
Creatinine, Ser: 0.92 mg/dL (ref 0.61–1.24)
GFR calc Af Amer: >60 mL/min (ref >60)
GFR calc non Af Amer: >60 mL/min (ref >60)
Glucose, Bld: 89 mg/dL (ref 70–99)
Potassium: 3.9 mmol/L (ref 3.5–5.1)
Sodium: 136 mmol/L (ref 135–145)

## 2019-03-11 LAB — CBC WITH DIFFERENTIAL/PLATELET
Abs Immature Granulocytes: 0.03 10*3/uL (ref 0.00–0.07)
Basophils Absolute: 0 10*3/uL (ref 0.0–0.1)
Basophils Relative: 1 %
Eosinophils Absolute: 0 10*3/uL (ref 0.0–0.5)
Eosinophils Relative: 1 %
HCT: 45.1 % (ref 39.0–52.0)
Hemoglobin: 15.1 g/dL (ref 13.0–17.0)
Immature Granulocytes: 1 %
Lymphocytes Relative: 31 %
Lymphs Abs: 1.5 10*3/uL (ref 0.7–4.0)
MCH: 29.9 pg (ref 26.0–34.0)
MCHC: 33.5 g/dL (ref 30.0–36.0)
MCV: 89.3 fL (ref 80.0–100.0)
Monocytes Absolute: 0.5 10*3/uL (ref 0.1–1.0)
Monocytes Relative: 10 %
Neutro Abs: 2.8 10*3/uL (ref 1.7–7.7)
Neutrophils Relative %: 56 %
Platelets: 194 10*3/uL (ref 150–400)
RBC: 5.05 MIL/uL (ref 4.22–5.81)
RDW: 12.1 % (ref 11.5–15.5)
WBC: 4.8 10*3/uL (ref 4.0–10.5)
nRBC: 0 % (ref 0.0–0.2)

## 2019-03-11 NOTE — ED Provider Notes (Signed)
[facility] DEPT Provider Note   CSN: [id number] Arrival date & time: 03/11/19  [place]     History   Chief Complaint Chief Complaint  Patient presents with  . Generalized Body Aches    HPI Franklin [name] is a 38 y.o. male.     HPI Franklin [name] is a 38 y.o. male with history of possible epilepsy, documented psychogenic nonepileptic seizures, conversion disorder, bipolar disorder, depression, presents to emergency department with complaint of generalized weakness and possible seizure.  Patient states he was not feeling well this afternoon.  States he felt generally weak.  He states that he apparently had a seizure according to his wife while laying in bed.  He did not injure himself.  Seizure occurred around 2pm.  He states he feels better now.  He denies headache.  Denies any tongue injury.  Denies any urinary incontinence.  He states he takes Trileptal and states he has not missed any doses.  He has not had any fever or chills.  No URI symptoms.  No other complaints.  Past Medical History:  Diagnosis Date  . Bipolar 1 disorder ([place])   . Chronic headaches   . Conversion disorder with seizures or convulsions   . 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 Depression   . Hx of electroencephalogram 12/2014   normal  . Mild cognitive impairment   . Psychogenic nonepileptic seizure   . Schizophrenic disorder ([place])   . Seizures [facility])     Patient Active Problem List   Diagnosis Date Noted  . Psychogenic nonepileptic seizure 02/18/2017  . Cholelithiasis 01/24/2017  . 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 Abnormal EKG 11/30/2015  . Elevated troponin 11/30/2015  . Bipolar affective disorder, most recent episode unspecified type, remission status unspecified 03/11/2015  . Bipolar affective disorder ([place]) 01/08/2015  . Generalized idiopathic epilepsy and  epileptic syndromes, without status epilepticus, not intractable ([place]) 11/27/2014  . Seizure disorder ([place]) 10/14/2014    No past surgical history on file.      Home Medications    Prior to Admission medications   Medication Sig Start Date End Date Taking? Authorizing Provider  acetaminophen (TYLENOL) 500 MG tablet Take 500-1,000 mg by mouth every 8 (eight) hours as needed (for pain or headaches).    [provider]  cariprazine (VRAYLAR) capsule Take 1.5 mg by mouth daily.     [provider]  cyclobenzaprine (FLEXERIL) 10 MG tablet Take 1 tablet (10 mg total) by mouth 2 (two) times daily as needed for muscle spasms. 12/14/18   Domenic Moras, PA-C  diclofenac sodium (VOLTAREN) 1 % GEL Apply 2 g topically 4 (four) times daily. 12/14/18   Domenic Moras, PA-C  meclizine (ANTIVERT) 25 MG tablet Take 1 tablet (25 mg total) by mouth 2 (two) times daily as needed for dizziness. Patient taking differently: Take 25 mg by mouth daily as needed for dizziness.  02/01/18   Jola Schmidt, MD  Oxcarbazepine (TRILEPTAL) 300 MG tablet Take 1/2 tablet twice a day 01/26/18   Argentina Donovan, PA-C    Family History Family History  Problem Relation Age of Onset  . Heart disease Mother     Social History Social History   Tobacco Use  . Smoking status: Never Smoker  . Smokeless tobacco: Never Used  Substance Use Topics  . Alcohol use: Not Currently    Alcohol/week: 0.0 standard drinks  .  Drug use: No     Allergies   Coconut oil, Other, Codeine, Depakote [divalproex sodium], Penicillins, Tape, and Latex   Review of Systems Review of Systems  Constitutional: Positive for fatigue. Negative for chills and fever.  Respiratory: Negative for cough, chest tightness and shortness of breath.   Cardiovascular: Negative for chest pain, palpitations and leg swelling.  Gastrointestinal: Negative for abdominal distention, abdominal pain, diarrhea, nausea and vomiting.  Genitourinary:  Negative for dysuria, frequency, hematuria and urgency.  Musculoskeletal: Negative for arthralgias, myalgias, neck pain and neck stiffness.  Skin: Negative for rash.  Allergic/Immunologic: Negative for immunocompromised state.  Neurological: Positive for seizures and weakness. Negative for dizziness, light-headedness, numbness and headaches.     Physical Exam Updated Vital Signs BP (!) 121/92 (BP Location: Right Arm)   Pulse (!) 59   Temp 98.3 F (36.8 C) (Oral)   Resp 16   Ht 6' (1.829 m)   Wt 69.9 kg   SpO2 100%   BMI 20.89 kg/m   Physical Exam Vitals signs and nursing note reviewed.  Constitutional:      General: He is not in acute distress.    Appearance: He is well-developed.  HENT:     Head: Normocephalic and atraumatic.  Eyes:     Conjunctiva/sclera: Conjunctivae normal.  Neck:     Musculoskeletal: Neck supple.  Cardiovascular:     Rate and Rhythm: Normal rate and regular rhythm.     Heart sounds: Normal heart sounds.  Pulmonary:     Effort: Pulmonary effort is normal. No respiratory distress.     Breath sounds: No wheezing or rales.  Abdominal:     General: Bowel sounds are normal. There is no distension.     Palpations: Abdomen is soft.     Tenderness: There is no abdominal tenderness. There is no rebound.  Skin:    General: Skin is warm and dry.  Neurological:     General: No focal deficit present.     Mental Status: He is alert and oriented to person, place, and time.     Cranial Nerves: No cranial nerve deficit.     Sensory: No sensory deficit.      ED Treatments / Results  Labs (all labs ordered are listed, but only abnormal results are displayed) Labs Reviewed  CBC WITH DIFFERENTIAL/PLATELET  BASIC METABOLIC PANEL    EKG None  Radiology No results found.  Procedures Procedures (including critical care time)  Medications Ordered in ED Medications - No data to display   Initial Impression / Assessment and Plan / ED Course  I have  reviewed the triage vital signs and the nursing notes.  Pertinent labs & imaging results that were available during my care of the patient were reviewed by me and considered in my medical decision making (see chart for details).        Patient with generalized weakness this afternoon and possible seizure.  He does have history of conversion disorder and nonepileptic psychogenic seizures.  He is currently not postictal.  According to EMS, he was aggressive towards them and try to fight them when they got there. Probably not postictal bc seizure happened at 2pm, picked up around 4pm.   Patient is denying any complaints to me.  He states he is hungry and would like a [place] sandwich.  Will check CBC and bmet to rule out electrolyte abnormality as the cause of his weakness and possible seizure.  Will monitor here.  5:43 PM CBC and BMET  normal. Pt eating crackers and peanut butter. Drinking fluids. VS normal. Pt stable for dc home. Asked if he can use telephone a little longer.  I do not think patient needs any further testing or imaging while in emergency department.  He stable for discharge home.  Strict return precautions discussed.      Vitals:   03/11/19 1655 03/11/19 1656 03/11/19 1704  BP: (!) 121/92    Pulse: (!) 59  63  Resp: 16  14  Temp: 98.3 F (36.8 C)    TempSrc: Oral    SpO2: 98% 100% 100%  Weight: 69.9 kg    Height: 6' (1.829 m)       Final Clinical Impressions(s) / ED Diagnoses   Final diagnoses:  Seizure-like activity [facility])  Body aches    ED Discharge Orders    None       Jeannett Senior, PA-C 03/11/19 1759    Virgel Manifold, MD 03/11/19 2048

## 2019-03-11 NOTE — Discharge Instructions (Signed)
Please make sure you are taking all your regular medications. Return as needed.

## 2019-03-11 NOTE — ED Triage Notes (Addendum)
Pt BIBA from home.   Per EMS- Pt c/o generalized weakness, light headedness starting this AM.  Family reports pt had grand mal seizure appx 1400 today.  EMS reports that pt refused to speak to staff, refused transport initially.  Pt only c/o "weak and dizzy" pt ambulatory on scene.    EMS reports pt was verbally and physically aggressive on scene.

## 2019-03-14 ENCOUNTER — Other Ambulatory Visit: Payer: Self-pay

## 2019-03-14 ENCOUNTER — Emergency Department (HOSPITAL_COMMUNITY)
Admission: EM | Admit: 2019-03-14 | Discharge: 2019-03-14 | Disposition: A | Discharge status: Home / Self Care | Payer: Medicare HMO | Attending: Emergency Medicine | Admitting: Emergency Medicine

## 2019-03-14 ENCOUNTER — Encounter (HOSPITAL_COMMUNITY): Payer: Self-pay | Admitting: Obstetrics and Gynecology

## 2019-03-14 DIAGNOSIS — R5383 Other fatigue: Secondary | ICD-10-CM | POA: Diagnosis not present

## 2019-03-14 DIAGNOSIS — R531 Weakness: Secondary | ICD-10-CM | POA: Diagnosis not present

## 2019-03-14 DIAGNOSIS — Z5321 Procedure and treatment not carried out due to patient leaving prior to being seen by health care provider: Secondary | ICD-10-CM | POA: Insufficient documentation

## 2019-03-14 NOTE — ED Notes (Signed)
Patient visualized standing outside eating animal crackers

## 2019-03-14 NOTE — ED Notes (Signed)
I called patient for a room in the back and no one responded

## 2019-03-14 NOTE — ED Triage Notes (Signed)
Patient coming by [facility] from home. Patient reports continued fatigue. Patient was seen for the same on 10/11. Pt ambulatory with EMS.

## 2019-03-15 ENCOUNTER — Emergency Department (HOSPITAL_COMMUNITY)
Admission: EM | Admit: 2019-03-15 | Discharge: 2019-03-15 | Disposition: A | Discharge status: Home / Self Care | Payer: Medicare HMO | Attending: Emergency Medicine | Admitting: Emergency Medicine

## 2019-03-15 ENCOUNTER — Encounter (HOSPITAL_COMMUNITY): Payer: Self-pay | Admitting: Emergency Medicine

## 2019-03-15 DIAGNOSIS — R569 Unspecified convulsions: Secondary | ICD-10-CM | POA: Diagnosis not present

## 2019-03-15 DIAGNOSIS — Z5321 Procedure and treatment not carried out due to patient leaving prior to being seen by health care provider: Secondary | ICD-10-CM | POA: Insufficient documentation

## 2019-03-15 DIAGNOSIS — G40909 Epilepsy, unspecified, not intractable, without status epilepticus: Secondary | ICD-10-CM

## 2019-03-15 DIAGNOSIS — R456 Violent behavior: Secondary | ICD-10-CM | POA: Diagnosis not present

## 2019-03-15 NOTE — ED Triage Notes (Signed)
Patient here from home with complaints of frequent seizures. Reports 5 seizures today. Hx of schizo, bipolar.

## 2019-03-15 NOTE — ED Notes (Signed)
Patient states that he is not staying and needs to leave. States that he is "fine" and does not know why wife sent him here.

## 2019-03-15 NOTE — ED Notes (Signed)
Patient ambulatory out door

## 2019-03-19 ENCOUNTER — Telehealth: Payer: Self-pay | Admitting: *Deleted

## 2019-03-19 ENCOUNTER — Encounter: Payer: Medicare HMO | Admitting: Family Medicine

## 2019-03-19 NOTE — Telephone Encounter (Signed)
I can place a referral if that is what he wants however if it needs to be documented in the physician's note, he needs to be scheduled for virtual visit.

## 2019-03-19 NOTE — Telephone Encounter (Signed)
Patient verified DOB Patient has an appointment set up with [facility] in November. Patient needs a psychology referral placed with [facility] in the notes

## 2019-03-21 ENCOUNTER — Telehealth: Payer: Self-pay | Admitting: Family Medicine

## 2019-03-21 NOTE — Telephone Encounter (Signed)
Called patient and scheduled an appt with his PCP on 03/27/19 for a phycology referral.

## 2019-03-21 NOTE — Telephone Encounter (Signed)
Please schedule for a virtual visit with newlin to discuss referral per her request

## 2019-03-23 ENCOUNTER — Emergency Department (HOSPITAL_COMMUNITY)
Admission: EM | Admit: 2019-03-23 | Discharge: 2019-03-23 | Disposition: A | Discharge status: Home / Self Care | Payer: Medicare HMO | Attending: Emergency Medicine | Admitting: Emergency Medicine

## 2019-03-23 ENCOUNTER — Other Ambulatory Visit: Payer: Self-pay

## 2019-03-23 ENCOUNTER — Encounter (HOSPITAL_COMMUNITY): Payer: Self-pay

## 2019-03-23 DIAGNOSIS — R531 Weakness: Secondary | ICD-10-CM | POA: Diagnosis not present

## 2019-03-23 DIAGNOSIS — R0789 Other chest pain: Secondary | ICD-10-CM | POA: Diagnosis not present

## 2019-03-23 DIAGNOSIS — Z5321 Procedure and treatment not carried out due to patient leaving prior to being seen by health care provider: Secondary | ICD-10-CM | POA: Diagnosis not present

## 2019-03-23 NOTE — ED Notes (Signed)
Called pt to obtain blood and unable to find pt.

## 2019-03-23 NOTE — ED Triage Notes (Addendum)
Pt BIB [facility] for eval of generalized weakness. Pt reports onset 2-3 days prior. Reports malaise x 24 hours as well.Reports some new nonfocal chest pain this AM

## 2019-03-24 ENCOUNTER — Other Ambulatory Visit: Payer: Self-pay

## 2019-03-24 ENCOUNTER — Encounter (HOSPITAL_COMMUNITY): Payer: Self-pay | Admitting: Emergency Medicine

## 2019-03-24 ENCOUNTER — Emergency Department (HOSPITAL_COMMUNITY)
Admission: EM | Admit: 2019-03-24 | Discharge: 2019-03-25 | Disposition: A | Discharge status: Home / Self Care | Payer: Medicare HMO | Attending: Emergency Medicine | Admitting: Emergency Medicine

## 2019-03-24 DIAGNOSIS — Z5321 Procedure and treatment not carried out due to patient leaving prior to being seen by health care provider: Secondary | ICD-10-CM | POA: Diagnosis not present

## 2019-03-24 DIAGNOSIS — R52 Pain, unspecified: Secondary | ICD-10-CM | POA: Diagnosis not present

## 2019-03-24 DIAGNOSIS — R1084 Generalized abdominal pain: Secondary | ICD-10-CM | POA: Insufficient documentation

## 2019-03-24 DIAGNOSIS — R569 Unspecified convulsions: Secondary | ICD-10-CM | POA: Insufficient documentation

## 2019-03-24 DIAGNOSIS — R531 Weakness: Secondary | ICD-10-CM | POA: Diagnosis not present

## 2019-03-24 LAB — COMPREHENSIVE METABOLIC PANEL
ALT: 29 U/L (ref 0–44)
AST: 21 U/L (ref 15–41)
Albumin: 4.2 g/dL (ref 3.5–5.0)
Alkaline Phosphatase: 71 U/L (ref 38–126)
Anion gap: 8 (ref 5–15)
BUN: 12 mg/dL (ref 6–20)
CO2: 27 mmol/L (ref 22–32)
Calcium: 9.6 mg/dL (ref 8.9–10.3)
Chloride: 104 mmol/L (ref 98–111)
Creatinine, Ser: 1 mg/dL (ref 0.61–1.24)
GFR calc Af Amer: >60 mL/min (ref >60)
GFR calc non Af Amer: >60 mL/min (ref >60)
Glucose, Bld: 84 mg/dL (ref 70–99)
Potassium: 4.1 mmol/L (ref 3.5–5.1)
Sodium: 139 mmol/L (ref 135–145)
Total Bilirubin: 0.6 mg/dL (ref 0.3–1.2)
Total Protein: 7.3 g/dL (ref 6.5–8.1)

## 2019-03-24 LAB — CBC
HCT: 43.8 % (ref 39.0–52.0)
Hemoglobin: 14.8 g/dL (ref 13.0–17.0)
MCH: 30.4 pg (ref 26.0–34.0)
MCHC: 33.8 g/dL (ref 30.0–36.0)
MCV: 89.9 fL (ref 80.0–100.0)
Platelets: 199 10*3/uL (ref 150–400)
RBC: 4.87 MIL/uL (ref 4.22–5.81)
RDW: 12.1 % (ref 11.5–15.5)
WBC: 6.8 10*3/uL (ref 4.0–10.5)
nRBC: 0 % (ref 0.0–0.2)

## 2019-03-24 LAB — LIPASE, BLOOD: Lipase: 32 U/L (ref 11–51)

## 2019-03-24 MED ORDER — SODIUM CHLORIDE 0.9% FLUSH: 3.0000 mL | Freq: Once | INTRAVENOUS | Status: DC

## 2019-03-24 NOTE — ED Triage Notes (Signed)
Pt to triage via [facility]> C/o generalized weakness x 2 days.  Reports he had a seizure earlier today.  Also c/o generalized abd pain that started this morning.  Denies nausea, vomiting, and diarrhea.

## 2019-03-24 NOTE — ED Notes (Signed)
Pt called for vitals recheck, no answer.

## 2019-03-27 ENCOUNTER — Ambulatory Visit (HOSPITAL_BASED_OUTPATIENT_CLINIC_OR_DEPARTMENT_OTHER): Payer: Medicare HMO | Admitting: Family Medicine

## 2019-03-27 ENCOUNTER — Emergency Department (HOSPITAL_COMMUNITY)
Admission: EM | Admit: 2019-03-27 | Discharge: 2019-03-27 | Disposition: A | Discharge status: Home / Self Care | Payer: Medicare HMO | Attending: Emergency Medicine | Admitting: Emergency Medicine

## 2019-03-27 ENCOUNTER — Encounter (HOSPITAL_COMMUNITY): Payer: Self-pay | Admitting: Emergency Medicine

## 2019-03-27 ENCOUNTER — Other Ambulatory Visit: Payer: Self-pay

## 2019-03-27 DIAGNOSIS — G40309 Generalized idiopathic epilepsy and epileptic syndromes, not intractable, without status epilepticus: Secondary | ICD-10-CM | POA: Insufficient documentation

## 2019-03-27 DIAGNOSIS — R569 Unspecified convulsions: Secondary | ICD-10-CM

## 2019-03-27 DIAGNOSIS — Z880 Allergy status to penicillin: Secondary | ICD-10-CM | POA: Insufficient documentation

## 2019-03-27 DIAGNOSIS — Z888 Allergy status to other drugs, medicaments and biological substances status: Secondary | ICD-10-CM | POA: Insufficient documentation

## 2019-03-27 DIAGNOSIS — Z91048 Other nonmedicinal substance allergy status: Secondary | ICD-10-CM | POA: Insufficient documentation

## 2019-03-27 DIAGNOSIS — Z885 Allergy status to narcotic agent status: Secondary | ICD-10-CM | POA: Insufficient documentation

## 2019-03-27 DIAGNOSIS — R4020 Unspecified coma: Secondary | ICD-10-CM | POA: Diagnosis not present

## 2019-03-27 DIAGNOSIS — F319 Bipolar disorder, unspecified: Secondary | ICD-10-CM | POA: Diagnosis not present

## 2019-03-27 DIAGNOSIS — R52 Pain, unspecified: Secondary | ICD-10-CM | POA: Diagnosis not present

## 2019-03-27 DIAGNOSIS — Z91040 Latex allergy status: Secondary | ICD-10-CM | POA: Diagnosis not present

## 2019-03-27 DIAGNOSIS — G40909 Epilepsy, unspecified, not intractable, without status epilepticus: Secondary | ICD-10-CM | POA: Diagnosis not present

## 2019-03-27 DIAGNOSIS — F29 Unspecified psychosis not due to a substance or known physiological condition: Secondary | ICD-10-CM | POA: Diagnosis not present

## 2019-03-27 DIAGNOSIS — Z79899 Other long term (current) drug therapy: Secondary | ICD-10-CM | POA: Insufficient documentation

## 2019-03-27 DIAGNOSIS — R404 Transient alteration of awareness: Secondary | ICD-10-CM | POA: Diagnosis not present

## 2019-03-27 NOTE — Progress Notes (Signed)
Patient has been called and DOB has been verified. Patient has been screened and transferred to PCP to start phone visit.     

## 2019-03-27 NOTE — ED Provider Notes (Signed)
[facility] EMERGENCY DEPARTMENT Provider Note   CSN: [id number] Arrival date & time: 03/27/19  1732     History   Chief Complaint No chief complaint on file.   HPI Franklin [name] is a 38 y.o. male.     HPI Patient presents with potential seizures.  Reportedly had seizures at home.  States he has an appointment in 2 days to see his neurologist who was at [facility].  Reportedly has been weak.  States he had a seizure today.  Last seizure was couple weeks ago.  Without complaints now.  States he is back to normal.  Patient is eager to head home.  Denies abdominal pain to me.  States he has been taking his medicine.  Denies any injury from the seizure. Past Medical History:  Diagnosis Date  . Bipolar 1 disorder ([place])   . Chronic headaches   . Conversion disorder with seizures or convulsions   . 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 Depression   . Hx of electroencephalogram 12/2014   normal  . Mild cognitive impairment   . Psychogenic nonepileptic seizure   . Schizophrenic disorder ([place])   . Seizures [facility])     Patient Active Problem List   Diagnosis Date Noted  . Psychogenic nonepileptic seizure 02/18/2017  . Cholelithiasis 01/24/2017  . 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 Abnormal EKG 11/30/2015  . Elevated troponin 11/30/2015  . Bipolar affective disorder, most recent episode unspecified type, remission status unspecified 03/11/2015  . Bipolar affective disorder ([place]) 01/08/2015  . Generalized idiopathic epilepsy and epileptic syndromes, without status epilepticus, not intractable ([place]) 11/27/2014  . Seizure disorder ([place]) 10/14/2014    History reviewed. No pertinent surgical history.      Home Medications    Prior to Admission medications   Medication Sig Start Date End Date Taking? Authorizing Provider  acetaminophen (TYLENOL) 500 MG  tablet Take 500-1,000 mg by mouth every 8 (eight) hours as needed (for pain or headaches).    [provider]  cariprazine (VRAYLAR) capsule Take 1.5 mg by mouth daily.     [provider]  cyclobenzaprine (FLEXERIL) 10 MG tablet Take 1 tablet (10 mg total) by mouth 2 (two) times daily as needed for muscle spasms. 12/14/18   Domenic Moras, PA-C  diclofenac sodium (VOLTAREN) 1 % GEL Apply 2 g topically 4 (four) times daily. Patient not taking: Reported on 03/27/2019 12/14/18   Domenic Moras, PA-C  meclizine (ANTIVERT) 25 MG tablet Take 1 tablet (25 mg total) by mouth 2 (two) times daily as needed for dizziness. Patient taking differently: Take 25 mg by mouth daily as needed for dizziness.  02/01/18   Jola Schmidt, MD  Oxcarbazepine (TRILEPTAL) 300 MG tablet Take 1/2 tablet twice a day Patient taking differently: Take 150 mg by mouth 2 (two) times daily.  01/26/18   Argentina Donovan, PA-C    Family History Family History  Problem Relation Age of Onset  . Heart disease Mother     Social History Social History   Tobacco Use  . Smoking status: Never Smoker  . Smokeless tobacco: Never Used  Substance Use Topics  . Alcohol use: Not Currently    Alcohol/week: 0.0 standard drinks  . Drug use: No     Allergies   Coconut oil, Other, Codeine, Depakote [divalproex sodium], Penicillins, Tape, and Latex   Review of Systems Review of Systems  Constitutional: Negative for appetite change.  HENT: Negative for congestion.   Respiratory: Negative for shortness of breath.   Cardiovascular: Negative for chest pain.  Gastrointestinal: Negative for abdominal pain.  Genitourinary: Negative for flank pain.  Musculoskeletal: Negative for back pain.  Skin: Negative for rash.  Neurological: Positive for seizures.  Psychiatric/Behavioral: Positive for confusion.     Physical Exam Updated Vital Signs BP 131/83 (BP Location: Right Arm) Comment: Simultaneous filing. User may not have  seen previous data.  Pulse 63 Comment: Simultaneous filing. User may not have seen previous data.  Temp 98.4 F (36.9 C) (Oral)   Resp 18 Comment: Simultaneous filing. User may not have seen previous data.  Wt 69.9 kg   SpO2 96% Comment: Simultaneous filing. User may not have seen previous data.  BMI 20.90 kg/m   Physical Exam Vitals signs and nursing note reviewed.  HENT:     Head: Normocephalic.     Mouth/Throat:     Mouth: Mucous membranes are moist.  Eyes:     Extraocular Movements: Extraocular movements intact.  Cardiovascular:     Rate and Rhythm: Regular rhythm.     Pulses: Normal pulses.  Pulmonary:     Breath sounds: No wheezing or rhonchi.  Abdominal:     Tenderness: There is no abdominal tenderness.  Musculoskeletal:     Right lower leg: No edema.     Left lower leg: No edema.  Skin:    General: Skin is warm.     Capillary Refill: Capillary refill takes less than 2 seconds.  Neurological:     Mental Status: He is alert and oriented to person, place, and time.      ED Treatments / Results  Labs (all labs ordered are listed, but only abnormal results are displayed) Labs Reviewed - No data to display  EKG None  Radiology No results found.  Procedures Procedures (including critical care time)  Medications Ordered in ED Medications - No data to display   Initial Impression / Assessment and Plan / ED Course  I have reviewed the triage vital signs and the nursing notes.  Pertinent labs & imaging results that were available during my care of the patient were reviewed by me and considered in my medical decision making (see chart for details).        Patient with questionable seizure activity at home.  Back at baseline.  No complaints now.  Benign exam.  Neurology follow-up in 2 days.  Seizure versus pseudoseizure.  Discharge home.  Final Clinical Impressions(s) / ED Diagnoses   Final diagnoses:  Seizure-like activity [facility])    ED Discharge  Orders    None       Davonna Belling, MD 03/27/19 1818

## 2019-03-27 NOTE — Discharge Instructions (Addendum)
Follow-up with your neurologist in 2 days as planned.

## 2019-03-27 NOTE — ED Triage Notes (Signed)
[facility]- pt here for multiple seizures. Pt has history of same and is on anticonvulsants. Has an appointment this week to see doctor. Denies pain, NVD. Pt was post-ictal with EMS and noted to be combative during this time.   152/94 60 HR 115 CBG 20 RR 99% RA

## 2019-03-27 NOTE — Progress Notes (Signed)
Virtual Visit via Telephone Note  I connected with [name], on 03/27/2019 at 3:49 PM by telephone due to the COVID-19 pandemic and verified that I am speaking with the correct person using two identifiers.   Consent: I discussed the limitations, risks, security and privacy concerns of performing an evaluation and management service by telephone and the availability of in person appointments. I also discussed with the patient that there may be a patient responsible charge related to this service. The patient expressed understanding and agreed to proceed.   Location of Patient: Home  Location of Provider: Clinic  Persons participating in Telemedicine visit: Adarryl Clyde Lundborg Farrington-CMA Dr. Felecia Shelling     History of Present Illness:  Wray Telander  is a 38 year old male with a history of psychogenic nonepileptic spells, bipolar disorder, mild cognitive impairment who presents today requesting referral to [facility] for Psychotherapy. He will require a referral from his PCP in order to receive services. He has no additional concerns today and remains on medications for his Bipolar disorder and seizures.    Past Medical History:  Diagnosis Date  . Bipolar 1 disorder ([place])   . Chronic headaches   . Conversion disorder with seizures or convulsions   . 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 Depression   . Hx of electroencephalogram 12/2014   normal  . Mild cognitive impairment   . Psychogenic nonepileptic seizure   . Schizophrenic disorder ([place])   . Seizures ([facility])    Allergies  Allergen Reactions  . Coconut Oil Anaphylaxis and Nausea And Vomiting    Reaction to any kind of coconut  . Other Anaphylaxis    Raisins  . Codeine Nausea And Vomiting  . Depakote [Divalproex Sodium] Other (See Comments)    Pt states that this medication makes him feel like he is "out of  it"  . Penicillins Nausea And Vomiting    Has patient had a PCN reaction causing immediate rash, facial/tongue/throat swelling, SOB or lightheadedness with hypotension: No Has patient had a PCN reaction causing severe rash involving mucus membranes or skin necrosis: No Has patient had a PCN reaction that required hospitalization No Has patient had a PCN reaction occurring within the last 10 years: No If all of the above answers are "NO", then may proceed with Cephalosporin use.    . Tape Other (See Comments)    TEARS THE SKIN; only "paper tape" is tolerated  . Latex Rash    Current Outpatient Medications on File Prior to Visit  Medication Sig Dispense Refill  . acetaminophen (TYLENOL) 500 MG tablet Take 500-1,000 mg by mouth every 8 (eight) hours as needed (for pain or headaches).    . cariprazine (VRAYLAR) capsule Take 1.5 mg by mouth daily.     . cyclobenzaprine (FLEXERIL) 10 MG tablet Take 1 tablet (10 mg total) by mouth 2 (two) times daily as needed for muscle spasms. 20 tablet 0  . meclizine (ANTIVERT) 25 MG tablet Take 1 tablet (25 mg total) by mouth 2 (two) times daily as needed for dizziness. (Patient taking differently: Take 25 mg by mouth daily as needed for dizziness. ) 10 tablet 0  . Oxcarbazepine (TRILEPTAL) 300 MG tablet Take 1/2 tablet twice a day (Patient taking differently: Take 150 mg by mouth 2 (two) times daily. ) 30 tablet 11  . diclofenac sodium (VOLTAREN) 1 % GEL Apply 2 g topically 4 (four) times  daily. (Patient not taking: Reported on 03/27/2019) 150 g 0   No current facility-administered medications on file prior to visit.     Observations/Objective: Awake, alert, oriented x3 Not in acute distress  Assessment and Plan: 1. Bipolar I disorder ([place]) Stable Continue Vraylar, Trileptal Referral faxed to [facility] - Ambulatory referral to Psychology   Follow Up Instructions: Keep previously scheduled appointment   I discussed the  assessment and treatment plan with the patient. The patient was provided an opportunity to ask questions and all were answered. The patient agreed with the plan and demonstrated an understanding of the instructions.   The patient was advised to call back or seek an in-person evaluation if the symptoms worsen or if the condition fails to improve as anticipated.     I provided 8 minutes total of non-face-to-face time during this encounter including median intraservice time, reviewing previous notes, labs, imaging, medications, management and patient verbalized understanding.     Charlott Rakes, MD, FAAFP. [facility] [place], [place]   03/27/2019, 3:49 PM

## 2019-03-28 ENCOUNTER — Encounter: Payer: Self-pay | Admitting: Family Medicine

## 2019-03-29 ENCOUNTER — Telehealth: Payer: Self-pay | Admitting: Family Medicine

## 2019-03-29 DIAGNOSIS — F445 Conversion disorder with seizures or convulsions: Secondary | ICD-10-CM | POA: Diagnosis not present

## 2019-03-29 DIAGNOSIS — R0789 Other chest pain: Secondary | ICD-10-CM | POA: Diagnosis not present

## 2019-03-29 DIAGNOSIS — G43009 Migraine without aura, not intractable, without status migrainosus: Secondary | ICD-10-CM | POA: Diagnosis not present

## 2019-03-29 DIAGNOSIS — G40309 Generalized idiopathic epilepsy and epileptic syndromes, not intractable, without status epilepticus: Secondary | ICD-10-CM | POA: Diagnosis not present

## 2019-03-29 DIAGNOSIS — R079 Chest pain, unspecified: Secondary | ICD-10-CM | POA: Diagnosis not present

## 2019-03-29 NOTE — Telephone Encounter (Signed)
Alycia did you get to fax this referral? Can you please update the patient. Referral is in Epic and anyone can print this off. Thanks

## 2019-03-29 NOTE — Telephone Encounter (Signed)
Patient called asking for the letter that goes to [facility] of the [employer]

## 2019-03-30 NOTE — Telephone Encounter (Signed)
Paperwork was faxed over on 03/27/2019, another copy has been faxed over today.

## 2019-04-18 ENCOUNTER — Encounter: Payer: Medicare HMO | Admitting: Family Medicine

## 2019-04-20 ENCOUNTER — Encounter (HOSPITAL_COMMUNITY): Payer: Self-pay | Admitting: Emergency Medicine

## 2019-04-20 ENCOUNTER — Emergency Department (HOSPITAL_COMMUNITY)
Admission: EM | Admit: 2019-04-20 | Discharge: 2019-04-20 | Disposition: A | Discharge status: Home / Self Care | Payer: Medicare HMO | Attending: Emergency Medicine | Admitting: Emergency Medicine

## 2019-04-20 ENCOUNTER — Other Ambulatory Visit: Payer: Self-pay

## 2019-04-20 DIAGNOSIS — G8929 Other chronic pain: Secondary | ICD-10-CM | POA: Insufficient documentation

## 2019-04-20 DIAGNOSIS — M545 Low back pain: Secondary | ICD-10-CM | POA: Diagnosis not present

## 2019-04-20 DIAGNOSIS — Z79899 Other long term (current) drug therapy: Secondary | ICD-10-CM | POA: Diagnosis not present

## 2019-04-20 DIAGNOSIS — Z91040 Latex allergy status: Secondary | ICD-10-CM | POA: Diagnosis not present

## 2019-04-20 MED ORDER — CYCLOBENZAPRINE HCL 10 MG PO TABS: 10.0000 mg | ORAL_TABLET | Freq: Two times a day (BID) | ORAL | 0 refills | Status: DC | PRN

## 2019-04-20 MED ORDER — KETOROLAC TROMETHAMINE 30 MG/ML IJ SOLN
30.0000 mg | Freq: Once | INTRAMUSCULAR | Status: AC
  Administered 2019-04-20: 30 mg via INTRAMUSCULAR
  Filled 2019-04-20: qty 1

## 2019-04-20 MED ORDER — NAPROXEN 500 MG PO TABS: 500.0000 mg | ORAL_TABLET | Freq: Two times a day (BID) | ORAL | 0 refills | Status: DC

## 2019-04-20 NOTE — ED Notes (Signed)
Pt verbalizes understanding of d/c instructions. Prescriptions reviewed with patient. Pt ambulatory at d/c with all belongings and with family.   

## 2019-04-20 NOTE — ED Triage Notes (Signed)
Pt endorses chronic lower back pain. States tylenol or hydrocodone is not helping.

## 2019-04-20 NOTE — Discharge Instructions (Signed)
Please read instructions below.  You can take naprosyn every 12 hours for pain. Apply ice to your back for 20 minutes at a time.  You can also apply heat if this provides more relief.   You can take Flexeril/cyclobenzaprine every 12 hours as needed for muscle spasm.  Be aware this medication can make you drowsy; do not take while driving or drinking alcohol.   Follow-up with your primary care provider symptoms persist.   Return to ER if new numbness or tingling in your arms or legs, inability to urinate, inability to hold your bowels, or weakness in your extremities.

## 2019-04-20 NOTE — ED Provider Notes (Signed)
[place] EMERGENCY DEPARTMENT Provider Note   CSN: [id number] Arrival date & time: 04/20/19  1521     History   Chief Complaint Chief Complaint  Patient presents with  . Back Pain    HPI Franklin [name] is a 38 y.o. male presenting to the ED with complaint of exacerbation of chronic low back pain.  Patient states symptoms worsened a few days ago, without trauma.  Pain is located to his low back bilaterally.  Pain does not radiate into his legs.  Franklin [name] is treated him symptoms with Tylenol and hydrocodone with minimal relief.  Franklin [name] does have a PCP, however has not followed up.  Franklin [name] denies associated bowel or bladder incontinence, saddle paresthesias, numbness or weakness in extremities, urinary symptoms, abdominal pain, fever history of IV drug use.     The history is provided by the patient.    Past Medical History:  Diagnosis Date  . Bipolar 1 disorder ([place])   . Chronic headaches   . Conversion disorder with seizures or convulsions   . 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 Depression   . Hx of electroencephalogram 12/2014   normal  . Mild cognitive impairment   . Psychogenic nonepileptic seizure   . Schizophrenic disorder ([facility])   . Seizures [facility])     Patient Active Problem List   Diagnosis Date Noted  . Psychogenic nonepileptic seizure 02/18/2017  . Cholelithiasis 01/24/2017  . 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 Abnormal EKG 11/30/2015  . Elevated troponin 11/30/2015  . Bipolar affective disorder, most recent episode unspecified type, remission status unspecified 03/11/2015  . Bipolar affective disorder ([place]) 01/08/2015  . Generalized idiopathic epilepsy and epileptic syndromes, without status epilepticus, not intractable ([place]) 11/27/2014  . Seizure disorder ([place]) 10/14/2014    History reviewed. No pertinent surgical history.      Home  Medications    Prior to Admission medications   Medication Sig Start Date End Date Taking? Authorizing Provider  acetaminophen (TYLENOL) 500 MG tablet Take 500-1,000 mg by mouth every 8 (eight) hours as needed (for pain or headaches).    [provider]  cariprazine (VRAYLAR) capsule Take 1.5 mg by mouth daily.     [provider]  cyclobenzaprine (FLEXERIL) 10 MG tablet Take 1 tablet (10 mg total) by mouth 2 (two) times daily as needed for muscle spasms. 04/20/19   Aleese Kamps, [place] N, PA-C  diclofenac sodium (VOLTAREN) 1 % GEL Apply 2 g topically 4 (four) times daily. Patient not taking: Reported on 03/27/2019 12/14/18   Domenic Moras, PA-C  meclizine (ANTIVERT) 25 MG tablet Take 1 tablet (25 mg total) by mouth 2 (two) times daily as needed for dizziness. Patient taking differently: Take 25 mg by mouth daily as needed for dizziness.  02/01/18   Jola Schmidt, MD  naproxen (NAPROSYN) 500 MG tablet Take 1 tablet (500 mg total) by mouth 2 (two) times daily with a meal. 04/20/19   Zamarah Ullmer, [place] N, PA-C  Oxcarbazepine (TRILEPTAL) 300 MG tablet Take 1/2 tablet twice a day Patient taking differently: Take 150 mg by mouth 2 (two) times daily.  01/26/18   Argentina Donovan, PA-C    Family History Family History  Problem Relation Age of Onset  . Heart disease Mother     Social History Social History   Tobacco Use  . Smoking status: Never Smoker  . Smokeless tobacco: Never Used  Substance Use Topics  . Alcohol use: Not Currently    Alcohol/week: 0.0 standard drinks  . Drug use: No     Allergies   Coconut oil, Other, Codeine, Depakote [divalproex sodium], Penicillins, Tape, and Latex   Review of Systems Review of Systems  Musculoskeletal: Positive for back pain.  All other systems reviewed and are negative.    Physical Exam Updated Vital Signs BP (!) 126/94 (BP Location: Right Arm)   Pulse 74   Temp 98.1 F (36.7 C) (Oral)   Resp 16   Ht 6' (1.829 m)   Wt  69.9 kg   SpO2 99%   BMI 20.90 kg/m   Physical Exam Vitals signs and nursing note reviewed.  Constitutional:      General: Franklin [name] is not in acute distress.    Appearance: Franklin [name] is well-developed.  HENT:     Head: Normocephalic and atraumatic.  Eyes:     Conjunctiva/sclera: Conjunctivae normal.  Cardiovascular:     Rate and Rhythm: Normal rate and regular rhythm.  Pulmonary:     Effort: Pulmonary effort is normal.  Abdominal:     General: Bowel sounds are normal.     Tenderness: There is no abdominal tenderness.  Musculoskeletal:       Back:     Comments: Generalized tenderness to bilateral lumbar musculature, no midline spinal tenderness, no any step-offs or gross deformities.  Moving all extremities without difficulty.  Negative straight leg raise.  Neurological:     Mental Status: Franklin [name] is alert.     Comments: Normal tone.  5/5 strength in BUE and BLE including strong and equal grip strength and dorsiflexion/plantar flexion Sensory: Pinprick and light touch normal in BLE extremities.  Gait: normal gait and balance CV: distal pulses palpable throughout    Psychiatric:        Mood and Affect: Mood normal.        Behavior: Behavior normal.      ED Treatments / Results  Labs (all labs ordered are listed, but only abnormal results are displayed) Labs Reviewed - No data to display  EKG None  Radiology No results found.  Procedures Procedures (including critical care time)  Medications Ordered in ED Medications  ketorolac (TORADOL) 30 MG/ML injection 30 mg (30 mg Intramuscular Given 04/20/19 1632)     Initial Impression / Assessment and Plan / ED Course  I have reviewed the triage vital signs and the nursing notes.  Pertinent labs & imaging results that were available during my care of the patient were reviewed by me and considered in my medical decision making (see chart for details).        Patient with exacerbation of chronic low back pain, no injuries.  No  neurological deficits and normal neuro exam.  Patient can walk but states is painful.  No loss of bowel or bladder control.  No concern for cauda equina.  No fever, or hx IVDU.  RICE protocol and pain medicine indicated and discussed with patient. PCP follow up. Safe for discharge.  Discussed results, findings, treatment and follow up. Patient advised of return precautions. Patient verbalized understanding and agreed with plan.  Final Clinical Impressions(s) / ED Diagnoses   Final diagnoses:  Acute exacerbation of chronic low back pain    ED Discharge Orders         Ordered    cyclobenzaprine (FLEXERIL) 10 MG tablet  2 times daily PRN     04/20/19 1640    naproxen (NAPROSYN) 500 MG  tablet  2 times daily with meals     04/20/19 1640           Kimani Bedoya, [place] N, PA-C 04/20/19 [place], [place], DO 04/20/19 2315

## 2019-04-23 DIAGNOSIS — F250 Schizoaffective disorder, bipolar type: Secondary | ICD-10-CM | POA: Diagnosis not present

## 2019-05-07 DIAGNOSIS — F250 Schizoaffective disorder, bipolar type: Secondary | ICD-10-CM | POA: Diagnosis not present

## 2019-05-17 ENCOUNTER — Encounter (HOSPITAL_COMMUNITY): Payer: Self-pay | Admitting: Emergency Medicine

## 2019-05-17 ENCOUNTER — Other Ambulatory Visit: Payer: Self-pay

## 2019-05-17 ENCOUNTER — Emergency Department (HOSPITAL_COMMUNITY)
Admission: EM | Admit: 2019-05-17 | Discharge: 2019-05-17 | Disposition: A | Discharge status: Home / Self Care | Payer: Medicare HMO | Attending: Emergency Medicine | Admitting: Emergency Medicine

## 2019-05-17 DIAGNOSIS — R42 Dizziness and giddiness: Secondary | ICD-10-CM | POA: Diagnosis not present

## 2019-05-17 DIAGNOSIS — Z5321 Procedure and treatment not carried out due to patient leaving prior to being seen by health care provider: Secondary | ICD-10-CM | POA: Diagnosis not present

## 2019-05-17 DIAGNOSIS — R110 Nausea: Secondary | ICD-10-CM | POA: Diagnosis not present

## 2019-05-17 DIAGNOSIS — R1110 Vomiting, unspecified: Secondary | ICD-10-CM | POA: Insufficient documentation

## 2019-05-17 DIAGNOSIS — R109 Unspecified abdominal pain: Secondary | ICD-10-CM | POA: Diagnosis not present

## 2019-05-17 DIAGNOSIS — R112 Nausea with vomiting, unspecified: Secondary | ICD-10-CM | POA: Diagnosis not present

## 2019-05-17 DIAGNOSIS — R1111 Vomiting without nausea: Secondary | ICD-10-CM | POA: Diagnosis not present

## 2019-05-17 LAB — COMPREHENSIVE METABOLIC PANEL
ALT: 32 U/L (ref 0–44)
AST: 26 U/L (ref 15–41)
Albumin: 4.4 g/dL (ref 3.5–5.0)
Alkaline Phosphatase: 64 U/L (ref 38–126)
Anion gap: 9 (ref 5–15)
BUN: 9 mg/dL (ref 6–20)
CO2: 25 mmol/L (ref 22–32)
Calcium: 9.3 mg/dL (ref 8.9–10.3)
Chloride: 107 mmol/L (ref 98–111)
Creatinine, Ser: 0.97 mg/dL (ref 0.61–1.24)
GFR calc Af Amer: >60 mL/min (ref >60)
GFR calc non Af Amer: >60 mL/min (ref >60)
Glucose, Bld: 91 mg/dL (ref 70–99)
Potassium: 4.2 mmol/L (ref 3.5–5.1)
Sodium: 141 mmol/L (ref 135–145)
Total Bilirubin: 0.9 mg/dL (ref 0.3–1.2)
Total Protein: 7.5 g/dL (ref 6.5–8.1)

## 2019-05-17 LAB — CBC
HCT: 45 % (ref 39.0–52.0)
Hemoglobin: 15.7 g/dL (ref 13.0–17.0)
MCH: 31.1 pg (ref 26.0–34.0)
MCHC: 34.9 g/dL (ref 30.0–36.0)
MCV: 89.1 fL (ref 80.0–100.0)
Platelets: 207 10*3/uL (ref 150–400)
RBC: 5.05 MIL/uL (ref 4.22–5.81)
RDW: 11.9 % (ref 11.5–15.5)
WBC: 7.2 10*3/uL (ref 4.0–10.5)
nRBC: 0 % (ref 0.0–0.2)

## 2019-05-17 LAB — LIPASE, BLOOD: Lipase: 31 U/L (ref 11–51)

## 2019-05-17 MED ORDER — SODIUM CHLORIDE 0.9% FLUSH: 3.0000 mL | Freq: Once | INTRAVENOUS | Status: DC

## 2019-05-17 NOTE — ED Triage Notes (Signed)
Pt arrives via EMS complaining of vomiting since 3am this morning. Pt complains of abd pain 5/10. Denies diarrhea and fever. BP [id number] systolic. HR 70 CBG 109. Pt also endorses dizziness.

## 2019-05-17 NOTE — ED Notes (Signed)
Last call for pt. No response. RN, Allie Bossier., notified.

## 2019-05-17 NOTE — ED Notes (Signed)
Called pt to recheck vital. No response.

## 2019-05-17 NOTE — ED Notes (Signed)
Called pt 2x to recheck vitals. No response and this tech does not see the pt in the waiting room.

## 2019-06-07 DIAGNOSIS — F250 Schizoaffective disorder, bipolar type: Secondary | ICD-10-CM | POA: Diagnosis not present

## 2019-06-08 DIAGNOSIS — H40033 Anatomical narrow angle, bilateral: Secondary | ICD-10-CM | POA: Diagnosis not present

## 2019-06-08 DIAGNOSIS — H52222 Regular astigmatism, left eye: Secondary | ICD-10-CM | POA: Diagnosis not present

## 2019-06-08 DIAGNOSIS — H53021 Refractive amblyopia, right eye: Secondary | ICD-10-CM | POA: Diagnosis not present

## 2019-06-08 DIAGNOSIS — H53002 Unspecified amblyopia, left eye: Secondary | ICD-10-CM | POA: Diagnosis not present

## 2019-06-08 DIAGNOSIS — H1045 Other chronic allergic conjunctivitis: Secondary | ICD-10-CM | POA: Diagnosis not present

## 2019-06-08 DIAGNOSIS — H5203 Hypermetropia, bilateral: Secondary | ICD-10-CM | POA: Diagnosis not present

## 2019-06-22 DIAGNOSIS — F250 Schizoaffective disorder, bipolar type: Secondary | ICD-10-CM | POA: Diagnosis not present

## 2019-06-24 ENCOUNTER — Other Ambulatory Visit: Payer: Self-pay

## 2019-06-24 ENCOUNTER — Emergency Department (HOSPITAL_COMMUNITY)
Admission: EM | Admit: 2019-06-24 | Discharge: 2019-06-24 | Disposition: A | Discharge status: Home / Self Care | Payer: Medicare HMO | Attending: Emergency Medicine | Admitting: Emergency Medicine

## 2019-06-24 DIAGNOSIS — R531 Weakness: Secondary | ICD-10-CM | POA: Diagnosis not present

## 2019-06-24 DIAGNOSIS — Z8669 Personal history of other diseases of the nervous system and sense organs: Secondary | ICD-10-CM | POA: Diagnosis not present

## 2019-06-24 DIAGNOSIS — E162 Hypoglycemia, unspecified: Secondary | ICD-10-CM | POA: Diagnosis not present

## 2019-06-24 DIAGNOSIS — Z79899 Other long term (current) drug therapy: Secondary | ICD-10-CM | POA: Diagnosis not present

## 2019-06-24 DIAGNOSIS — R42 Dizziness and giddiness: Secondary | ICD-10-CM | POA: Diagnosis not present

## 2019-06-24 DIAGNOSIS — Z91040 Latex allergy status: Secondary | ICD-10-CM | POA: Insufficient documentation

## 2019-06-24 DIAGNOSIS — R55 Syncope and collapse: Secondary | ICD-10-CM | POA: Diagnosis not present

## 2019-06-24 DIAGNOSIS — F250 Schizoaffective disorder, bipolar type: Secondary | ICD-10-CM | POA: Insufficient documentation

## 2019-06-24 DIAGNOSIS — R404 Transient alteration of awareness: Secondary | ICD-10-CM | POA: Diagnosis not present

## 2019-06-24 DIAGNOSIS — R569 Unspecified convulsions: Secondary | ICD-10-CM | POA: Diagnosis not present

## 2019-06-24 DIAGNOSIS — E161 Other hypoglycemia: Secondary | ICD-10-CM | POA: Diagnosis not present

## 2019-06-24 DIAGNOSIS — R4182 Altered mental status, unspecified: Secondary | ICD-10-CM | POA: Diagnosis not present

## 2019-06-24 NOTE — ED Triage Notes (Signed)
Pt presents from home for AMS. Per roommate, pt said his blood sugar was low, assisted him to couch where he became less responsive (poor historian). EMS arrived to pt awake but not answering question , would exhibit brief episodes of tensing up and then relaxing which family call  "normal for him". No tremor or gaze deviation noted. CBG 87, pt got up and ate spaghetti in kitchen and would not accept help from EMS/was uncooperative. After about 72min of this behavior, became A&Ox4 and allowed transport to ED.  New medication Risperdal per family but EMS records shows old.   H/o seizures, bipolar, anxiety, DM  HR 80, RR20, 137/82, 97% RA.  In exam room, pt would not allow staff to assist into gown

## 2019-06-24 NOTE — ED Provider Notes (Signed)
[place] EMERGENCY DEPARTMENT Provider Note   CSN: [id number] Arrival date & time: 06/24/19  1945     History Chief Complaint  Patient presents with  . Altered Mental Status    Franklin [name] is a 39 y.o. male.  HPI Patient presents after feeling bad.  Reportedly became less responsive at home after saying his sugar was low.  Upon arrival EMS showed sugar of 87.  Had potential tensing up and relaxing.  Patient is has history of these which have been called nonepileptic seizures in the past.  Patient states he feels dizzy because he is recently started on Risperdal by step-by-step.  States he can talk to them on Thursday about this.  States he has been on it for around a week now.  No chest pain.  No trouble breathing.  Feeling somewhat better now.  Patient is hoping that I will adjust his psychiatric medicines.  No suicidal homicidal thoughts.    Past Medical History:  Diagnosis Date  . Bipolar 1 disorder ([place])   . Chronic headaches   . Conversion disorder with seizures or convulsions   . 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 Depression   . Hx of electroencephalogram 12/2014   normal  . Mild cognitive impairment   . Psychogenic nonepileptic seizure   . Schizophrenic disorder ([place])   . Seizures [facility])     Patient Active Problem List   Diagnosis Date Noted  . Psychogenic nonepileptic seizure 02/18/2017  . Cholelithiasis 01/24/2017  . 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 Abnormal EKG 11/30/2015  . Elevated troponin 11/30/2015  . Bipolar affective disorder, most recent episode unspecified type, remission status unspecified 03/11/2015  . Bipolar affective disorder ([place]) 01/08/2015  . Generalized idiopathic epilepsy and epileptic syndromes, without status epilepticus, not intractable ([place]) 11/27/2014  . Seizure disorder ([place]) 10/14/2014    No past  surgical history on file.     Family History  Problem Relation Age of Onset  . Heart disease Mother     Social History   Tobacco Use  . Smoking status: Never Smoker  . Smokeless tobacco: Never Used  Substance Use Topics  . Alcohol use: Not Currently    Alcohol/week: 0.0 standard drinks  . Drug use: No    Home Medications Prior to Admission medications   Medication Sig Start Date End Date Taking? Authorizing Provider  acetaminophen (TYLENOL) 500 MG tablet Take 500-1,000 mg by mouth every 8 (eight) hours as needed (for pain or headaches).    [provider]  cariprazine (VRAYLAR) capsule Take 1.5 mg by mouth daily.     [provider]  cyclobenzaprine (FLEXERIL) 10 MG tablet Take 1 tablet (10 mg total) by mouth 2 (two) times daily as needed for muscle spasms. 04/20/19   Robinson, [place] N, PA-C  diclofenac sodium (VOLTAREN) 1 % GEL Apply 2 g topically 4 (four) times daily. Patient not taking: Reported on 03/27/2019 12/14/18   Domenic Moras, PA-C  meclizine (ANTIVERT) 25 MG tablet Take 1 tablet (25 mg total) by mouth 2 (two) times daily as needed for dizziness. Patient taking differently: Take 25 mg by mouth daily as needed for dizziness.  02/01/18   Jola Schmidt, MD  naproxen (NAPROSYN) 500 MG tablet Take 1 tablet (500 mg total) by mouth 2 (two) times daily with a meal. 04/20/19   Robinson, [place] N, PA-C  Oxcarbazepine (TRILEPTAL) 300 MG tablet  Take 1/2 tablet twice a day Patient taking differently: Take 150 mg by mouth 2 (two) times daily.  01/26/18   Argentina Donovan, PA-C    Allergies    Coconut oil, Other, Codeine, Depakote [divalproex sodium], Penicillins, Tape, and Latex  Review of Systems   Review of Systems  Constitutional: Negative for appetite change.  HENT: Negative for congestion.   Gastrointestinal: Negative for abdominal pain.  Genitourinary: Negative for enuresis.  Musculoskeletal: Negative for back pain.  Skin: Negative for rash.   Neurological: Positive for dizziness and weakness.  Psychiatric/Behavioral: Negative for confusion.    Physical Exam Updated Vital Signs BP 127/88 (BP Location: Right Arm)   Pulse 69   Temp 98.2 F (36.8 C) (Oral)   Resp 18   Ht 6' (1.829 m)   Wt 69.9 kg   SpO2 99%   BMI 20.89 kg/m   Physical Exam Vitals and nursing note reviewed.  HENT:     Head: Normocephalic.  Eyes:     Extraocular Movements: Extraocular movements intact.     Pupils: Pupils are equal, round, and reactive to light.  Cardiovascular:     Rate and Rhythm: Regular rhythm.  Pulmonary:     Effort: Pulmonary effort is normal.  Abdominal:     Tenderness: There is no abdominal tenderness.  Musculoskeletal:     Right lower leg: No edema.     Left lower leg: No edema.  Skin:    Coloration: Skin is not jaundiced.  Neurological:     Mental Status: He is alert and oriented to person, place, and time. Mental status is at baseline.     ED Results / Procedures / Treatments   Labs (all labs ordered are listed, but only abnormal results are displayed) Labs Reviewed - No data to display  EKG EKG Interpretation  Date/Time:  Sunday June 24 2019 20:27:08 EST Ventricular Rate:  67 PR Interval:  198 QRS Duration: 112 QT Interval:  384 QTC Calculation: 405 R Axis:   -24 Text Interpretation: Normal sinus rhythm Minimal voltage criteria for LVH, may be normal variant ( R in aVL ) Nonspecific T wave abnormality Abnormal ECG Confirmed by Davonna Belling [phone]) on 06/24/2019 8:37:45 PM   Radiology No results found.  Procedures Procedures (including critical care time)  Medications Ordered in ED Medications - No data to display  ED Course  I have reviewed the triage vital signs and the nursing notes.  Pertinent labs & imaging results that were available during my care of the patient were reviewed by me and considered in my medical decision making (see chart for details).    MDM Rules/Calculators/A&P                       Patient presents with dizziness.  States started after taking his Risperdal.  Also questionable seizure/pseudoseizure activity.  Patient appears at his baseline.  EKG reassuring.  Labs reviewed that were done recently and reassuring.  Does not appear to need further work-up this time.  Discharge home to follow-up with his psychiatric provider. Final Clinical Impression(s) / ED Diagnoses Final diagnoses:  Lightheadedness    Rx / DC Orders ED Discharge Orders    None       Davonna Belling, MD 06/24/19 2043

## 2019-06-24 NOTE — ED Notes (Signed)
Patient verbalizes understanding of discharge instructions. Opportunity for questioning and answers were provided. Armband removed by staff, pt discharged from ED to home with friend 

## 2019-06-24 NOTE — ED Notes (Signed)
Pt did not want to change into a gown

## 2019-06-24 NOTE — Discharge Instructions (Addendum)
Follow-up with your psychiatric providers about potential adjustments of the medication.

## 2019-07-04 DIAGNOSIS — F250 Schizoaffective disorder, bipolar type: Secondary | ICD-10-CM | POA: Diagnosis not present

## 2019-07-08 ENCOUNTER — Encounter (HOSPITAL_COMMUNITY): Payer: Self-pay | Admitting: *Deleted

## 2019-07-08 ENCOUNTER — Emergency Department (HOSPITAL_COMMUNITY): Payer: Medicare HMO

## 2019-07-08 ENCOUNTER — Other Ambulatory Visit: Payer: Self-pay

## 2019-07-08 ENCOUNTER — Emergency Department (HOSPITAL_COMMUNITY)
Admission: EM | Admit: 2019-07-08 | Discharge: 2019-07-09 | Disposition: A | Discharge status: Home / Self Care | Payer: Medicare HMO | Attending: Emergency Medicine | Admitting: Emergency Medicine

## 2019-07-08 DIAGNOSIS — I499 Cardiac arrhythmia, unspecified: Secondary | ICD-10-CM | POA: Diagnosis not present

## 2019-07-08 DIAGNOSIS — R079 Chest pain, unspecified: Secondary | ICD-10-CM | POA: Diagnosis not present

## 2019-07-08 DIAGNOSIS — R0789 Other chest pain: Secondary | ICD-10-CM | POA: Diagnosis not present

## 2019-07-08 DIAGNOSIS — R9431 Abnormal electrocardiogram [ECG] [EKG]: Secondary | ICD-10-CM | POA: Diagnosis not present

## 2019-07-08 DIAGNOSIS — K224 Dyskinesia of esophagus: Secondary | ICD-10-CM | POA: Diagnosis not present

## 2019-07-08 DIAGNOSIS — I213 ST elevation (STEMI) myocardial infarction of unspecified site: Secondary | ICD-10-CM | POA: Diagnosis not present

## 2019-07-08 DIAGNOSIS — R072 Precordial pain: Secondary | ICD-10-CM | POA: Insufficient documentation

## 2019-07-08 LAB — BASIC METABOLIC PANEL
Anion gap: 12 (ref 5–15)
BUN: 15 mg/dL (ref 6–20)
CO2: 23 mmol/L (ref 22–32)
Calcium: 9.6 mg/dL (ref 8.9–10.3)
Chloride: 107 mmol/L (ref 98–111)
Creatinine, Ser: 1.4 mg/dL — ABNORMAL HIGH (ref 0.61–1.24)
GFR calc Af Amer: >60 mL/min (ref >60)
GFR calc non Af Amer: >60 mL/min (ref >60)
Glucose, Bld: 104 mg/dL — ABNORMAL HIGH (ref 70–99)
Potassium: 4.1 mmol/L (ref 3.5–5.1)
Sodium: 142 mmol/L (ref 135–145)

## 2019-07-08 LAB — CBC
HCT: 44.5 % (ref 39.0–52.0)
Hemoglobin: 15.3 g/dL (ref 13.0–17.0)
MCH: 30.2 pg (ref 26.0–34.0)
MCHC: 34.4 g/dL (ref 30.0–36.0)
MCV: 87.8 fL (ref 80.0–100.0)
Platelets: 199 10*3/uL (ref 150–400)
RBC: 5.07 MIL/uL (ref 4.22–5.81)
RDW: 11.9 % (ref 11.5–15.5)
WBC: 6.4 10*3/uL (ref 4.0–10.5)
nRBC: 0 % (ref 0.0–0.2)

## 2019-07-08 LAB — TROPONIN I (HIGH SENSITIVITY): Troponin I (High Sensitivity): 3 ng/L (ref <18)

## 2019-07-08 MED ORDER — SODIUM CHLORIDE 0.9% FLUSH: 3.0000 mL | Freq: Once | INTRAVENOUS | Status: DC

## 2019-07-08 NOTE — Discharge Instructions (Signed)
We saw you in the ER for the chest pain/shortness of breath. All of our cardiac workup is normal, including labs, EKG and chest X-RAY are normal. We are not sure what is causing your discomfort, but we suspect esophageal spasm. The workup in the ER is not complete, and you should follow up with your primary care doctor for further evaluation.

## 2019-07-08 NOTE — ED Triage Notes (Signed)
Pt reports pain in the center of his chest started after eating tonight, pain has since subsided.

## 2019-07-08 NOTE — ED Triage Notes (Signed)
Pt arrives via EMS with c/o chest pain with shortness of breath, abdominal pain with right sided guarded. Nausea. Sudden onset pain 10/10. 12 lead EKG changes. 324 ASA, refused IV. 140/80, hr 60-70 (NSR)  cbg 94, temp 98.8, 100% RA. Pt appeared to be paranoid per EMS

## 2019-07-09 LAB — TROPONIN I (HIGH SENSITIVITY): Troponin I (High Sensitivity): 4 ng/L (ref <18)

## 2019-07-09 NOTE — ED Notes (Signed)
Discharge instructions discussed with pt. Pt verbalized understanding with no questions at this time. Pt denies pain. Pt ambulatory. Pt to go home with uncle.

## 2019-07-09 NOTE — ED Provider Notes (Signed)
[facility] EMERGENCY DEPARTMENT Provider Note   CSN: [id number] Arrival date & time: 07/08/19  2106     History Chief Complaint  Patient presents with  . Chest Pain    Franklin [name] is a 39 y.o. male.  HPI  HPI: A 39 year old patient presents for evaluation of chest pain. Initial onset of pain was less than one hour ago. The patient's chest pain is described as heaviness/pressure/tightness and is not worse with exertion. The patient's chest pain is middle- or left-sided, is not well-localized, is not sharp and does not radiate to the arms/jaw/neck. The patient does not complain of nausea and denies diaphoresis. The patient has no history of stroke, has no history of peripheral artery disease, has not smoked in the past 90 days, denies any history of treated diabetes, has no relevant family history of coronary artery disease (first degree relative at less than age 51), is not hypertensive, has no history of hypercholesterolemia and does not have an elevated BMI (>=30).   Past Medical History:  Diagnosis Date  . Bipolar 1 disorder ([place])   . Chronic headaches   . Conversion disorder with seizures or convulsions   . 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 Depression   . Hx of electroencephalogram 12/2014   normal  . Mild cognitive impairment   . Psychogenic nonepileptic seizure   . Schizophrenic disorder ([place])   . Seizures [facility])     Patient Active Problem List   Diagnosis Date Noted  . Psychogenic nonepileptic seizure 02/18/2017  . Cholelithiasis 01/24/2017  . 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 Abnormal EKG 11/30/2015  . Elevated troponin 11/30/2015  . Bipolar affective disorder, most recent episode unspecified type, remission status unspecified 03/11/2015  . Bipolar affective disorder ([place]) 01/08/2015  . Generalized idiopathic epilepsy and epileptic  syndromes, without status epilepticus, not intractable ([place]) 11/27/2014  . Seizure disorder ([place]) 10/14/2014    History reviewed. No pertinent surgical history.     Family History  Problem Relation Age of Onset  . Heart disease Mother     Social History   Tobacco Use  . Smoking status: Never Smoker  . Smokeless tobacco: Never Used  Substance Use Topics  . Alcohol use: Not Currently    Alcohol/week: 0.0 standard drinks  . Drug use: No    Home Medications Prior to Admission medications   Medication Sig Start Date End Date Taking? Authorizing Provider  lurasidone (LATUDA) 20 MG TABS tablet Take 20 mg by mouth daily.   Yes [provider]  Oxcarbazepine (TRILEPTAL) 300 MG tablet Take 1/2 tablet twice a day Patient not taking: Reported on 07/08/2019 01/26/18 07/09/19  Argentina Donovan, PA-C    Allergies    Coconut oil, Other, Codeine, Depakote [divalproex sodium], Hydroxyzine, Penicillins, Tape, Latex, and Levetiracetam  Review of Systems   Review of Systems  Constitutional: Positive for activity change.  Cardiovascular: Positive for chest pain.  All other systems reviewed and are negative.   Physical Exam Updated Vital Signs BP 122/84   Pulse 64   Temp 98.6 F (37 C) (Oral)   Resp 17   Ht 6' (1.829 m)   Wt 69.9 kg   SpO2 99%   BMI 20.89 kg/m   Physical Exam Vitals and nursing note reviewed.  Constitutional:      Appearance: He is well-developed.  HENT:     Head: Atraumatic.  Cardiovascular:  Rate and Rhythm: Normal rate.  Pulmonary:     Effort: Pulmonary effort is normal.  Musculoskeletal:     Cervical back: Neck supple.  Skin:    General: Skin is warm.  Neurological:     Mental Status: He is alert and oriented to person, place, and time.     ED Results / Procedures / Treatments   Labs (all labs ordered are listed, but only abnormal results are displayed) Labs Reviewed  BASIC METABOLIC PANEL - Abnormal; Notable for the following  components:      Result Value   Glucose, Bld 104 (*)    Creatinine, Ser 1.40 (*)    All other components within normal limits  CBC  TROPONIN I (HIGH SENSITIVITY)  TROPONIN I (HIGH SENSITIVITY)    EKG EKG Interpretation  Date/Time:  Sunday July 08 2019 21:11:19 EST Ventricular Rate:  62 PR Interval:    QRS Duration: 110 QT Interval:  389 QTC Calculation: 395 R Axis:   -23 Text Interpretation: Sinus rhythm Borderline left axis deviation Abnormal R-wave progression, early transition No acute changes No significant change since last tracing Confirmed by Varney Biles [phone]) on 07/08/2019 9:19:52 PM Also confirmed by Varney Biles [phone]), editor Hattie Perch ([id number])  on 07/09/2019 7:56:50 AM   Radiology DG Chest 2 View  Result Date: 07/08/2019 CLINICAL DATA:  Chest pain EXAM: CHEST - 2 VIEW COMPARISON:  12/27/2018 FINDINGS: Heart and mediastinal contours are within normal limits. No focal opacities or effusions. No acute bony abnormality. IMPRESSION: No active cardiopulmonary disease. Electronically Signed   By: Rolm Baptise M.D.   On: 07/08/2019 21:46    Procedures Procedures (including critical care time)  Medications Ordered in ED Medications - No data to display  ED Course  I have reviewed the triage vital signs and the nursing notes.  Pertinent labs & imaging results that were available during my care of the patient were reviewed by me and considered in my medical decision making (see chart for details).    MDM Rules/Calculators/A&P HEAR Score: 1                    CC: chest pain.  Differential diagnosis includes: ACS syndrome Myocarditis Pericarditis Pneumothorax Musculoskeletal pain PUD / Gastritis / Esophagitis Esophageal spasm  Delta trop ordered. Suspected esophageal spasm as the cause, as the symptoms started after po intake and resolved few minutes later.  Final Clinical Impression(s) / ED Diagnoses Final diagnoses:  Precordial chest pain   Esophageal spasm    Rx / DC Orders ED Discharge Orders    None       Varney Biles, MD 07/10/19 [phone]

## 2019-07-30 DIAGNOSIS — F250 Schizoaffective disorder, bipolar type: Secondary | ICD-10-CM | POA: Diagnosis not present

## 2019-08-21 DIAGNOSIS — F250 Schizoaffective disorder, bipolar type: Secondary | ICD-10-CM | POA: Diagnosis not present

## 2019-08-22 ENCOUNTER — Other Ambulatory Visit: Payer: Self-pay

## 2019-08-22 ENCOUNTER — Encounter (HOSPITAL_COMMUNITY): Payer: Self-pay | Admitting: Pharmacy Technician

## 2019-08-22 ENCOUNTER — Emergency Department (HOSPITAL_COMMUNITY)
Admission: EM | Admit: 2019-08-22 | Discharge: 2019-08-22 | Disposition: A | Discharge status: Home / Self Care | Payer: Medicare HMO | Attending: Emergency Medicine | Admitting: Emergency Medicine

## 2019-08-22 DIAGNOSIS — R531 Weakness: Secondary | ICD-10-CM | POA: Insufficient documentation

## 2019-08-22 DIAGNOSIS — Z5321 Procedure and treatment not carried out due to patient leaving prior to being seen by health care provider: Secondary | ICD-10-CM | POA: Diagnosis not present

## 2019-08-22 DIAGNOSIS — R42 Dizziness and giddiness: Secondary | ICD-10-CM | POA: Diagnosis not present

## 2019-08-22 DIAGNOSIS — R52 Pain, unspecified: Secondary | ICD-10-CM | POA: Diagnosis not present

## 2019-08-22 DIAGNOSIS — R519 Headache, unspecified: Secondary | ICD-10-CM | POA: Diagnosis not present

## 2019-08-22 DIAGNOSIS — G4489 Other headache syndrome: Secondary | ICD-10-CM | POA: Diagnosis not present

## 2019-08-22 LAB — BASIC METABOLIC PANEL
Anion gap: 11 (ref 5–15)
BUN: 16 mg/dL (ref 6–20)
CO2: 25 mmol/L (ref 22–32)
Calcium: 9.6 mg/dL (ref 8.9–10.3)
Chloride: 105 mmol/L (ref 98–111)
Creatinine, Ser: 1.02 mg/dL (ref 0.61–1.24)
GFR calc Af Amer: >60 mL/min (ref >60)
GFR calc non Af Amer: >60 mL/min (ref >60)
Glucose, Bld: 89 mg/dL (ref 70–99)
Potassium: 4.1 mmol/L (ref 3.5–5.1)
Sodium: 141 mmol/L (ref 135–145)

## 2019-08-22 LAB — CBC
HCT: 44.9 % (ref 39.0–52.0)
Hemoglobin: 15.3 g/dL (ref 13.0–17.0)
MCH: 30 pg (ref 26.0–34.0)
MCHC: 34.1 g/dL (ref 30.0–36.0)
MCV: 88 fL (ref 80.0–100.0)
Platelets: 205 10*3/uL (ref 150–400)
RBC: 5.1 MIL/uL (ref 4.22–5.81)
RDW: 12.2 % (ref 11.5–15.5)
WBC: 5.8 10*3/uL (ref 4.0–10.5)
nRBC: 0 % (ref 0.0–0.2)

## 2019-08-22 MED ORDER — SODIUM CHLORIDE 0.9% FLUSH: 3.0000 mL | Freq: Once | INTRAVENOUS | Status: DC

## 2019-08-22 NOTE — ED Triage Notes (Signed)
Pt bib ems with headache and dizziness with generalized weakness onset yesterday. No focal neuro deficits noted. Pt endorses feeling like he was going to pass out.  128/78 HR 58 RR 16 99% RA 96CBG

## 2019-08-22 NOTE — ED Notes (Signed)
Called X2 with no response

## 2019-08-23 ENCOUNTER — Ambulatory Visit (HOSPITAL_BASED_OUTPATIENT_CLINIC_OR_DEPARTMENT_OTHER): Payer: Medicare HMO | Admitting: Critical Care Medicine

## 2019-08-23 ENCOUNTER — Encounter: Payer: Self-pay | Admitting: Critical Care Medicine

## 2019-08-23 ENCOUNTER — Ambulatory Visit (HOSPITAL_COMMUNITY)
Admission: RE | Admit: 2019-08-23 | Discharge: 2019-08-23 | Disposition: A | Discharge status: Home / Self Care | Payer: Medicare HMO | Attending: Psychiatry | Admitting: Psychiatry

## 2019-08-23 DIAGNOSIS — Z880 Allergy status to penicillin: Secondary | ICD-10-CM | POA: Insufficient documentation

## 2019-08-23 DIAGNOSIS — Z885 Allergy status to narcotic agent status: Secondary | ICD-10-CM | POA: Insufficient documentation

## 2019-08-23 DIAGNOSIS — G40909 Epilepsy, unspecified, not intractable, without status epilepticus: Secondary | ICD-10-CM | POA: Diagnosis not present

## 2019-08-23 DIAGNOSIS — Z888 Allergy status to other drugs, medicaments and biological substances status: Secondary | ICD-10-CM | POA: Diagnosis not present

## 2019-08-23 DIAGNOSIS — F445 Conversion disorder with seizures or convulsions: Secondary | ICD-10-CM | POA: Insufficient documentation

## 2019-08-23 DIAGNOSIS — G3184 Mild cognitive impairment, so stated: Secondary | ICD-10-CM | POA: Diagnosis not present

## 2019-08-23 DIAGNOSIS — F209 Schizophrenia, unspecified: Secondary | ICD-10-CM | POA: Insufficient documentation

## 2019-08-23 DIAGNOSIS — F40240 Claustrophobia: Secondary | ICD-10-CM | POA: Diagnosis not present

## 2019-08-23 DIAGNOSIS — Z1389 Encounter for screening for other disorder: Secondary | ICD-10-CM | POA: Diagnosis not present

## 2019-08-23 DIAGNOSIS — F3175 Bipolar disorder, in partial remission, most recent episode depressed: Secondary | ICD-10-CM

## 2019-08-23 DIAGNOSIS — Z8249 Family history of ischemic heart disease and other diseases of the circulatory system: Secondary | ICD-10-CM | POA: Diagnosis not present

## 2019-08-23 DIAGNOSIS — Z91040 Latex allergy status: Secondary | ICD-10-CM | POA: Diagnosis not present

## 2019-08-23 MED ORDER — ALPRAZOLAM 1 MG PO TABS: ORAL_TABLET | ORAL | 0 refills | Status: DC

## 2019-08-23 NOTE — Progress Notes (Signed)
DOB and name verified  Hospital f /u

## 2019-08-23 NOTE — BH Assessment (Signed)
Assessment Note  [facility] [name] is an 39 y.o. male present to [facility] stating, "I just want someone to talk to." Patient has history of Bipolar and Schizophrenia. Report he has a psychiatrist and therapist at [facility]. Report him and his wife are receiving family counseling at [facility]. Patient present with no concerns. Denied suicidal/homicidal ideations, denied auditory/visual hallucinations, and denied feeling paranoid. Report his takes Latuda 40mg .   Present well groomed, thoughts seem clear and judgment unimpaired. Speech low and logically. Good eye contact, denied issues with sleep or appetite.   Collateral: Zetta Bills (wife): Client gave permission to speak with his wife. The wife report no concerns and denied any safety issues.     Diagnosis: F20.9   Schizophrenia  Past Medical History:  Past Medical History:  Diagnosis Date  . Bipolar 1 disorder ([place])   . Chronic headaches   . Conversion disorder with seizures or convulsions   . 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 Depression   . Hx of electroencephalogram 12/2014   normal  . Mild cognitive impairment   . Psychogenic nonepileptic seizure   . Schizophrenic disorder ([place])   . Seizures ([place])     No past surgical history on file.  Family History:  Family History  Problem Relation Age of Onset  . Heart disease Mother     Social History:  reports that he has never smoked. He has never used smokeless tobacco. He reports previous alcohol use. He reports that he does not use drugs.  Additional Social History:  Alcohol / Drug Use Pain Medications: see MAR Prescriptions: see MAR Over the Counter: see MAR History of alcohol / drug use?: No history of alcohol / drug abuse  CIWA:   COWS:    Allergies:  Allergies  Allergen Reactions  . Coconut Oil Anaphylaxis and Nausea And Vomiting    Reaction to any kind of coconut   . Other Anaphylaxis    Raisins  . Codeine Nausea And Vomiting  . Depakote [Divalproex Sodium] Other (See Comments)    Pt states that this medication makes him feel like he is "out of it"  . Hydroxyzine Other (See Comments)    Other reaction(s): Dizziness (intolerance) "passed out"  . Penicillins Nausea And Vomiting    Has patient had a PCN reaction causing immediate rash, facial/tongue/throat swelling, SOB or lightheadedness with hypotension: No Has patient had a PCN reaction causing severe rash involving mucus membranes or skin necrosis: No Has patient had a PCN reaction that required hospitalization No Has patient had a PCN reaction occurring within the last 10 years: No If all of the above answers are "NO", then may proceed with Cephalosporin use.    . Tape Other (See Comments)    TEARS THE SKIN; only "paper tape" is tolerated  . Latex Rash  . Levetiracetam Nausea And Vomiting    Home Medications: (Not in a hospital admission)   OB/GYN Status:  No LMP for male patient.  General Assessment Data [facility] of Assessment[facility](walk-in[facility] TTS Assessment: In system Is this a Tele or Face-to-Face Assessment?: Face-to-Face Is this an Initial Assessment or a Re-assessment for this encounter?: Initial Assessment Patient Accompanied by:: Adult(wife-Angie Sherral Hammers) Permission Given to speak with another: Yes Name, Relationship and Phone Number: wife-Angie Sherral Hammers Language Other than English: No Living Arrangements: Other (Comment)(live with wife ) What gender do you identify as?: Male Marital  status: Married Living Arrangements: Spouse/significant other Can pt return to current living arrangement?: Yes Admission Status: Voluntary Is patient capable of signing voluntary admission?: Yes Referral Source: Self/Family/Friend Insurance type: Medicaid      Crisis Care Plan Living Arrangements: Spouse/significant other Name of Psychiatrist: [facility]  Name of  Therapist: [facility]   Education Status Is patient currently in school?: No  Risk to self with the past 6 months Suicidal Ideation: No Has patient been a risk to self within the past 6 months prior to admission? : No Suicidal Intent: No Has patient had any suicidal intent within the past 6 months prior to admission? : No Is patient at risk for suicide?: No Suicidal Plan?: No Has patient had any suicidal plan within the past 6 months prior to admission? : No Access to Means: No What has been your use of drugs/alcohol within the last 12 months?: n/a Previous Attempts/Gestures: No How many times?: 0 Other Self Harm Risks: none report  Triggers for Past Attempts: None known Intentional Self Injurious Behavior: None Family Suicide History: No Recent stressful life event(s): Conflict (Comment)(report in marriage counseling with wife ) Persecutory voices/beliefs?: No Depression: No Depression Symptoms: (none report) Substance abuse history and/or treatment for substance abuse?: No Suicide prevention information given to non-admitted patients: Not applicable  Risk to Others within the past 6 months Homicidal Ideation: No Does patient have any lifetime risk of violence toward others beyond the six months prior to admission? : No Thoughts of Harm to Others: No Current Homicidal Intent: No Current Homicidal Plan: No Access to Homicidal Means: No Identified Victim: n/a History of harm to others?: No Assessment of Violence: None Noted Violent Behavior Description: None Noted  Does patient have access to weapons?: No Criminal Charges Pending?: No Does patient have a court date: No Is patient on probation?: No  Psychosis Hallucinations: None noted Delusions: None noted  Mental Status Report Appearance/Hygiene: (dressed appropriately for weather) Eye Contact: Good Motor Activity: Freedom of movement Speech: Logical/coherent Level of Consciousness: Alert Mood:  Pleasant Affect: Appropriate to circumstance Anxiety Level: None Judgement: Unimpaired Orientation: Person, Place, Time, Situation Obsessive Compulsive Thoughts/Behaviors: None  Cognitive Functioning Concentration: Normal Memory: Recent Intact, Remote Intact Is patient IDD: No Insight: Good Impulse Control: Good Appetite: Good Have you had any weight changes? : No Change Sleep: No Change Total Hours of Sleep: 8 Vegetative Symptoms: None  ADLScreening [facility] Assessment Services) Patient's cognitive ability adequate to safely complete daily activities?: Yes Patient able to express need for assistance with ADLs?: Yes Independently performs ADLs?: Yes (appropriate for developmental age)  Prior Inpatient Therapy Prior Inpatient Therapy: Yes Prior Therapy Dates: report years ago  Prior Outpatient Therapy Prior Outpatient Therapy: Yes Prior Therapy Facilty/Provider(s): [facility]  Reason for Treatment: mental health  Does patient have an ACCT team?: No Does patient have Intensive In-House Services?  : No Does patient have [facility] services? : No Does patient have P4CC services?: No  ADL Screening (condition at time of admission) Patient's cognitive ability adequate to safely complete daily activities?: Yes Is the patient deaf or have difficulty hearing?: No Does the patient have difficulty seeing, even when wearing glasses/contacts?: No Does the patient have difficulty concentrating, remembering, or making decisions?: No Patient able to express need for assistance with ADLs?: Yes Does the patient have difficulty dressing or bathing?: No Independently performs ADLs?: Yes (appropriate for developmental age)       Abuse/Neglect Assessment (Assessment to be complete while patient is  alone) Abuse/Neglect Assessment Can Be Completed: Yes Physical Abuse: Denies Verbal Abuse: Denies Sexual Abuse: Denies Exploitation of patient/patient's resources: Denies Self-Neglect: Denies      Regulatory affairs officer (For Healthcare) Does Patient Have a Medical Advance Directive?: No Would patient like information on creating a medical advance directive?: No - Patient declined          Disposition:  Disposition Initial Assessment Completed for this Encounter: Yes(Shuvon Rankin, NP, client does not meet inpt tx) Disposition of Patient: Discharge(Shuvon Rankin, NP, patient does not meet inpatient criteria)  On Site Evaluation by:   Reviewed with Physician:    Despina Hidden 08/23/2019 5:08 PM

## 2019-08-23 NOTE — Progress Notes (Signed)
Subjective:    Patient ID: [name], male    DOB: Sep 19, 1980, 39 y.o.   MRN: [id number] I connected with Arlington Lonsway on 08/24/19 at 11:00 AM EDT by telephone and verified that I am speaking with the correct person using two identifiers.   Consent:  I discussed the limitations, risks, security and privacy concerns of performing an evaluation and management service by telephone and the availability of in person appointments. I also discussed with the patient that there may be a patient responsible charge related to this service. The patient expressed understanding and agreed to proceed.  Location of patient: Patient was at home  Location of provider: I was in my office  Persons participating in the televisit with the patient.   No one else on the call  This is a 39 year old male who wishes to be seen by way of a telemedicine visit after being in the emergency room recently for presyncope.  Patient has a primary care physician in this clinic site Dr. [name]  Patient was last seen in October 2020 for assessment of bipolar disorder was recommended to continue Vraylar and Trileptal and was referred to family services the [place]  Note in the interim the patient has been switched to [place] and he is on a 40 mg daily dosage per his [facility] psychiatrist that he sees he does go to family services for counseling as well.  The patient's had several episodes of blacking out he does not have any premonition of this he has been found on the floor he is not incontinent history of seizures in the past history of migraine headaches as well.  He was seen by neurology both at the [facility] our neurology site and in [facility] both felt this was a conversion reaction and EEG was performed and was normal in 2020.  His Trileptal was actually discontinued since that time.  The patient has no focal deficits to his complaints.  He does note of some headaches that are chronic.  He does not smoke nor drink alcohol  or take other substances recreationally.  He was to have an MRI in 2016 when he was seen by the [facility] neurologist.  The patient cannot tolerate the scan it was not completed because of claustrophobia.  Note when he was in the emergency room yesterday he left without being seen but did have an EKG which was normal without prolonged QT interval and normal basic metabolic panel   Past Medical History:  Diagnosis Date  . Bipolar 1 disorder ([place])   . Chronic headaches   . Conversion disorder with seizures or convulsions   . 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 Depression   . Hx of electroencephalogram 12/2014   normal  . Mild cognitive impairment   . Psychogenic nonepileptic seizure   . Schizophrenic disorder ([place])   . Seizures ([place])      Family History  Problem Relation Age of Onset  . Heart disease Mother      Social History   Socioeconomic History  . Marital status: Married    Spouse name: Not on file  . Number of children: Not on file  . Years of education: Not on file  . Highest education level: Not on file  Occupational History  . Not on file  Tobacco Use  . Smoking status: Never Smoker  . Smokeless tobacco: Never Used  Substance and Sexual Activity  .  Alcohol use: Not Currently    Alcohol/week: 0.0 standard drinks  . Drug use: No  . Sexual activity: Not Currently    Birth control/protection: Condom  Other Topics Concern  . Not on file  Social History Narrative  . Not on file   Social Determinants of Health   Financial Resource Strain:   . Difficulty of Paying Living Expenses:   Food Insecurity:   . Worried About Charity fundraiser in the Last Year:   . Arboriculturist in the Last Year:   Transportation Needs:   . Film/video editor (Medical):   Marland Kitchen Lack of Transportation (Non-Medical):   Physical Activity:   . Days of Exercise per Week:   . Minutes of Exercise per  Session:   Stress:   . Feeling of Stress :   Social Connections:   . Frequency of Communication with Friends and Family:   . Frequency of Social Gatherings with Friends and Family:   . Attends Religious Services:   . Active Member of Clubs or Organizations:   . Attends Archivist Meetings:   Marland Kitchen Marital Status:   Intimate Partner Violence:   . Fear of Current or Ex-Partner:   . Emotionally Abused:   Marland Kitchen Physically Abused:   . Sexually Abused:      Allergies  Allergen Reactions  . Coconut Oil Anaphylaxis and Nausea And Vomiting    Reaction to any kind of coconut  . Other Anaphylaxis    Raisins  . Codeine Nausea And Vomiting  . Depakote [Divalproex Sodium] Other (See Comments)    Pt states that this medication makes him feel like he is "out of it"  . Hydroxyzine Other (See Comments)    Other reaction(s): Dizziness (intolerance) "passed out"  . Penicillins Nausea And Vomiting    Has patient had a PCN reaction causing immediate rash, facial/tongue/throat swelling, SOB or lightheadedness with hypotension: No Has patient had a PCN reaction causing severe rash involving mucus membranes or skin necrosis: No Has patient had a PCN reaction that required hospitalization No Has patient had a PCN reaction occurring within the last 10 years: No If all of the above answers are "NO", then may proceed with Cephalosporin use.    . Tape Other (See Comments)    TEARS THE SKIN; only "paper tape" is tolerated  . Latex Rash  . Levetiracetam Nausea And Vomiting     Outpatient Medications Prior to Visit  Medication Sig Dispense Refill  . lurasidone (LATUDA) 20 MG TABS tablet Take 40 mg by mouth daily.      No facility-administered medications prior to visit.      Review of Systems  Constitutional: Negative for fatigue and fever.  HENT: Negative for ear discharge, ear pain, hearing loss, nosebleeds, postnasal drip, rhinorrhea, sinus pain, sore throat and trouble swallowing.    Respiratory: Positive for chest tightness and shortness of breath. Negative for wheezing.   Cardiovascular: Positive for chest pain. Negative for palpitations.  Gastrointestinal: Negative.   Neurological: Positive for tremors, seizures, syncope, weakness and headaches. Negative for numbness.       Objective:   Physical Exam No exam this is a telephone visit       Assessment & Plan:  I personally reviewed all images and lab data in the [facility] system as well as any outside material available during this office visit and agree with the  radiology impressions.   Psychogenic nonepileptic seizure My suspicion is this is psychogenic nonepileptic seizure  with a conversion type disorder  An MRI has never been performed so I'll proceed to order this and have this patient follow-up with his primary care provider  I don't believe the Anette Guarneri is causing these spells the patient is having and the patient does not have any evidence of prolonged QTc interval on EKG to suggest an arrhythmia  1 mg Xanax with 1 pill count was sent to his pharmacy to take a half an hour before his MRI to help with claustrophobia and anxiety for the study to be completed  Conversion disorder with seizures or convulsions As per nonepileptic pseudoseizure assessment   Diagnoses and all orders for this visit:  Psychogenic nonepileptic seizure -     MR Brain W Wo Contrast; Future  Seizure disorder ([place]) -     MR Brain W Wo Contrast; Future  Bipolar disorder, in partial remission, most recent episode depressed ([place])  Conversion disorder with seizures or convulsions  Other orders -     ALPRAZolam (XANAX) 1 MG tablet; Take 1mg  Xanax 30 minutes before MRI study   Follow Up Instructions: Patient knows a follow-up exam face-to-face to be scheduled with the patient's primary care physician  I reorder the MRI with and without contrast and sent this order request in for approval as well gave one dose of 1 mg of Xanax for  the patient to take prior to the MRI appointment    I discussed the assessment and treatment plan with the patient. The patient was provided an opportunity to ask questions and all were answered. The patient agreed with the plan and demonstrated an understanding of the instructions.   The patient was advised to call back or seek an in-person evaluation if the symptoms worsen or if the condition fails to improve as anticipated.  I provided 30 minutes of non-face-to-face time during this encounter  including  median intraservice time , review of notes, labs, imaging, medications  and explaining diagnosis and management to the patient .    Asencion Noble, MD

## 2019-08-23 NOTE — H&P (Signed)
Behavioral Health Medical Screening Exam  [name] is an 39 y.o. male presented to [facility] as walk in stating that he needed someone to talk to.  Patient denies suicidal/self-harm/homicidal ideation, psychosis, and paranoia.  States that he has outpatient services with [facility] and first appointment was today.  States he does not need resources just needed someone to talk to.    Total Time spent with patient: 30 minutes  Psychiatric Specialty Exam: Physical Exam  Vitals reviewed. Constitutional: He is oriented to person, place, and time. He appears well-developed and well-nourished. No distress.  Respiratory: Effort normal.  Musculoskeletal:        General: Normal range of motion.     Cervical back: Normal range of motion.  Neurological: He is alert and oriented to person, place, and time.  Skin: Skin is warm and dry.  Psychiatric: He has a normal mood and affect. His speech is normal and behavior is normal. Judgment and thought content normal. Cognition and memory are normal.    Review of Systems  Psychiatric/Behavioral: Negative for confusion, hallucinations, self-injury, sleep disturbance and suicidal ideas. The patient is not nervous/anxious.   All other systems reviewed and are negative.   There were no vitals taken for this visit.There is no height or weight on file to calculate BMI.  General Appearance: Casual  Eye Contact:  Good  Speech:  Blocked and Normal Rate  Volume:  Normal  Mood:  "Fine now that I talked to someone"  Affect:  Appropriate and Congruent  Thought Process:  Coherent, Goal Directed and Descriptions of Associations: Intact  Orientation:  Full (Time, Place, and Person)  Thought Content:  WDL and Logical  Suicidal Thoughts:  No  Homicidal Thoughts:  No  Memory:  Immediate;   Good Recent;   Good  Judgement:  Intact  Insight:  Present  Psychomotor Activity:  Normal  Concentration: Concentration: Good and Attention Span: Good  Recall:  Good  Fund  of Knowledge:Fair  Language: Good  Akathisia:  No  Handed:  Right  AIMS (if indicated):     Assets:  Communication Skills Desire for Improvement Housing Physical Health Social Support  Sleep:       Musculoskeletal: Strength & Muscle Tone: within normal limits Gait & Station: normal Patient leans: N/A  There were no vitals taken for this visit.  Recommendations:  Continue with current outpatient psychiatric services  Based on my evaluation the patient does not appear to have an emergency medical condition.  Xochil Shanker, NP 08/23/2019, 5:33 PM

## 2019-08-24 ENCOUNTER — Telehealth: Payer: Self-pay

## 2019-08-24 NOTE — Assessment & Plan Note (Signed)
As per nonepileptic pseudoseizure assessment

## 2019-08-24 NOTE — Assessment & Plan Note (Signed)
My suspicion is this is psychogenic nonepileptic seizure with a conversion type disorder  An MRI has never been performed so I'll proceed to order this and have this patient follow-up with his primary care provider  I don't believe the Anette Guarneri is causing these spells the patient is having and the patient does not have any evidence of prolonged QTc interval on EKG to suggest an arrhythmia  1 mg Xanax with 1 pill count was sent to his pharmacy to take a half an hour before his MRI to help with claustrophobia and anxiety for the study to be completed

## 2019-08-24 NOTE — Telephone Encounter (Signed)
[facility] / Spoke with Clovis Cao  Approved Auth# [id number]
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
## 2019-09-06 DIAGNOSIS — F250 Schizoaffective disorder, bipolar type: Secondary | ICD-10-CM | POA: Diagnosis not present

## 2019-09-12 DIAGNOSIS — F250 Schizoaffective disorder, bipolar type: Secondary | ICD-10-CM | POA: Diagnosis not present

## 2019-09-13 ENCOUNTER — Ambulatory Visit (HOSPITAL_COMMUNITY): Admission: RE | Admit: 2019-09-13 | Payer: Medicare HMO | Source: Ambulatory Visit

## 2019-09-15 ENCOUNTER — Other Ambulatory Visit: Payer: Self-pay

## 2019-09-15 ENCOUNTER — Encounter (HOSPITAL_COMMUNITY): Payer: Self-pay

## 2019-09-15 ENCOUNTER — Ambulatory Visit (HOSPITAL_COMMUNITY)
Admission: EM | Admit: 2019-09-15 | Discharge: 2019-09-15 | Disposition: A | Discharge status: Home / Self Care | Payer: Medicare HMO | Attending: Physician Assistant | Admitting: Physician Assistant

## 2019-09-15 DIAGNOSIS — Z8249 Family history of ischemic heart disease and other diseases of the circulatory system: Secondary | ICD-10-CM | POA: Diagnosis not present

## 2019-09-15 DIAGNOSIS — R0981 Nasal congestion: Secondary | ICD-10-CM | POA: Diagnosis not present

## 2019-09-15 DIAGNOSIS — J069 Acute upper respiratory infection, unspecified: Secondary | ICD-10-CM

## 2019-09-15 DIAGNOSIS — R5383 Other fatigue: Secondary | ICD-10-CM | POA: Insufficient documentation

## 2019-09-15 DIAGNOSIS — Z91040 Latex allergy status: Secondary | ICD-10-CM | POA: Insufficient documentation

## 2019-09-15 DIAGNOSIS — Z888 Allergy status to other drugs, medicaments and biological substances status: Secondary | ICD-10-CM | POA: Insufficient documentation

## 2019-09-15 DIAGNOSIS — Z880 Allergy status to penicillin: Secondary | ICD-10-CM | POA: Diagnosis not present

## 2019-09-15 DIAGNOSIS — Z885 Allergy status to narcotic agent status: Secondary | ICD-10-CM | POA: Insufficient documentation

## 2019-09-15 DIAGNOSIS — Z20822 Contact with and (suspected) exposure to covid-19: Secondary | ICD-10-CM | POA: Insufficient documentation

## 2019-09-15 DIAGNOSIS — J029 Acute pharyngitis, unspecified: Secondary | ICD-10-CM

## 2019-09-15 LAB — SARS CORONAVIRUS 2 (TAT 6-24 HRS): SARS Coronavirus 2: NEGATIVE

## 2019-09-15 LAB — POCT RAPID STREP A: Streptococcus, Group A Screen (Direct): NEGATIVE

## 2019-09-15 NOTE — Discharge Instructions (Addendum)
You have a viral infection. Covid test is pending. Your strep is negative. Antibiotics do not help viruses only time. Try some zyrtec for runny nose, Ibuprofen 600mg  every 8 hours for generalized achines and fatigue. Push water and rest. FU if worsens.

## 2019-09-15 NOTE — ED Provider Notes (Signed)
[place]    CSN: [id number] Arrival date & time: 09/15/19  1429      History   Chief Complaint Chief Complaint  Patient presents with  . Fatigue  . Nasal Congestion    HPI Verlyn Sulewski is a 39 y.o. male.      Who presents with nasal drainage, sore throat and fatigue x 2 days. No fever or chills. No cough or congestion. He states that his wife was sick a few days ago. No headaches. No loss of taste or smell     Past Medical History:  Diagnosis Date  . Bipolar 1 disorder ([place])   . Chronic headaches   . Conversion disorder with seizures or convulsions   . 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 Depression   . Hx of electroencephalogram 12/2014   normal  . Mild cognitive impairment   . Psychogenic nonepileptic seizure   . Schizophrenic disorder ([place])   . Seizures [facility])     Patient Active Problem List   Diagnosis Date Noted  . Migraine without aura and without status migrainosus, not intractable 05/22/2018  . Vertigo 05/22/2018  . Psychogenic nonepileptic seizure 02/18/2017  . Cholelithiasis 01/24/2017  . 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 Spells of decreased attentiveness 03/16/2016  . Abnormal EKG 11/30/2015  . Elevated troponin 11/30/2015  . Bipolar affective disorder, most recent episode unspecified type, remission status unspecified 03/11/2015  . Bipolar affective disorder ([place]) 01/08/2015  . Generalized idiopathic epilepsy and epileptic syndromes, without status epilepticus, not intractable ([place]) 11/27/2014  . Seizure disorder ([place]) 10/14/2014    History reviewed. No pertinent surgical history.     Home Medications    Prior to Admission medications   Medication Sig Start Date End Date Taking? Authorizing Provider  ALPRAZolam Duanne Moron) 1 MG tablet Take 1mg  Xanax 30 minutes before MRI study 08/23/19   Elsie Stain, MD    lurasidone (LATUDA) 20 MG TABS tablet Take 40 mg by mouth daily.     [provider]  Oxcarbazepine (TRILEPTAL) 300 MG tablet Take 1/2 tablet twice a day Patient not taking: Reported on 07/08/2019 01/26/18 07/09/19  Argentina Donovan, PA-C    Family History Family History  Problem Relation Age of Onset  . Heart disease Mother     Social History Social History   Tobacco Use  . Smoking status: Never Smoker  . Smokeless tobacco: Never Used  Substance Use Topics  . Alcohol use: Not Currently    Alcohol/week: 0.0 standard drinks  . Drug use: No     Allergies   Coconut oil, Other, Codeine, Depakote [divalproex sodium], Hydroxyzine, Penicillins, Tape, Latex, and Levetiracetam   Review of Systems Review of Systems  Constitutional: Positive for fatigue. Negative for chills and fever.  HENT: Positive for rhinorrhea and sore throat. Negative for congestion.   Eyes: Negative.   Respiratory: Negative for cough, shortness of breath and wheezing.   Musculoskeletal: Negative.   Skin: Negative for rash.  Neurological: Negative.   Psychiatric/Behavioral: Negative.      Physical Exam Triage Vital Signs ED Triage Vitals  Enc Vitals Group     BP 09/15/19 1507 (!) 108/57     Pulse Rate 09/15/19 1507 70     Resp 09/15/19 1507 19     Temp 09/15/19 1507 98.2 F (36.8 C)     Temp Source 09/15/19 1507 Oral     SpO2  09/15/19 1507 99 %     Weight --      Height --      Head Circumference --      Peak Flow --      Pain Score 09/15/19 1511 5     Pain Loc --      Pain Edu? --      Excl. in [place]? --    No data found.  Updated Vital Signs BP (!) 108/57 (BP Location: Right Arm)   Pulse 70   Temp 98.2 F (36.8 C) (Oral)   Resp 19   SpO2 99%   Visual Acuity Right Eye Distance:   Left Eye Distance:   Bilateral Distance:    Right Eye Near:   Left Eye Near:    Bilateral Near:     Physical Exam Vitals and nursing note reviewed.  Constitutional:      Appearance: Normal  appearance.  HENT:     Head: Normocephalic and atraumatic.     Nose: Rhinorrhea present. No congestion.     Comments: Clear drainage    Mouth/Throat:     Mouth: Mucous membranes are moist.     Comments: Mild erythema without exudate or swelling Eyes:     General: No scleral icterus. Cardiovascular:     Rate and Rhythm: Normal rate and regular rhythm.  Pulmonary:     Effort: Pulmonary effort is normal.     Breath sounds: Normal breath sounds.  Musculoskeletal:     Cervical back: Normal range of motion and neck supple.  Skin:    General: Skin is warm and dry.  Neurological:     General: No focal deficit present.     Mental Status: He is alert.  Psychiatric:        Mood and Affect: Mood normal.        Behavior: Behavior normal.      UC Treatments / Results  Labs (all labs ordered are listed, but only abnormal results are displayed) Labs Reviewed  SARS CORONAVIRUS 2 (TAT 6-24 HRS)    EKG   Radiology No results found.  Procedures Procedures (including critical care time)  Medications Ordered in UC Medications - No data to display  Initial Impression / Assessment and Plan / UC Course  I have reviewed the triage vital signs and the nursing notes.  Pertinent labs & imaging results that were available during my care of the patient were reviewed by me and considered in my medical decision making (see chart for details).      2 day history of rhinorrhea, fatigue and sore throat. No indication for abx use at this time. Symptomatic care only. FU if worsens.  Final Clinical Impressions(s) / UC Diagnoses   Final diagnoses:  None   Discharge Instructions   None    ED Prescriptions    None     PDMP not reviewed this encounter.   Bjorn Pippin, PA-C 09/15/19 1556

## 2019-09-15 NOTE — ED Triage Notes (Signed)
Patient body aches, fatigue, and runny nose.

## 2019-09-16 ENCOUNTER — Emergency Department (HOSPITAL_COMMUNITY): Payer: Medicare HMO

## 2019-09-16 ENCOUNTER — Emergency Department (HOSPITAL_COMMUNITY)
Admission: EM | Admit: 2019-09-16 | Discharge: 2019-09-16 | Disposition: A | Discharge status: Home / Self Care | Payer: Medicare HMO | Attending: Emergency Medicine | Admitting: Emergency Medicine

## 2019-09-16 ENCOUNTER — Encounter (HOSPITAL_COMMUNITY): Payer: Self-pay | Admitting: Emergency Medicine

## 2019-09-16 ENCOUNTER — Other Ambulatory Visit: Payer: Self-pay

## 2019-09-16 DIAGNOSIS — K529 Noninfective gastroenteritis and colitis, unspecified: Secondary | ICD-10-CM | POA: Diagnosis not present

## 2019-09-16 DIAGNOSIS — R05 Cough: Secondary | ICD-10-CM | POA: Diagnosis not present

## 2019-09-16 DIAGNOSIS — R509 Fever, unspecified: Secondary | ICD-10-CM | POA: Diagnosis not present

## 2019-09-16 DIAGNOSIS — R110 Nausea: Secondary | ICD-10-CM | POA: Insufficient documentation

## 2019-09-16 DIAGNOSIS — F250 Schizoaffective disorder, bipolar type: Secondary | ICD-10-CM | POA: Diagnosis not present

## 2019-09-16 DIAGNOSIS — K625 Hemorrhage of anus and rectum: Secondary | ICD-10-CM | POA: Diagnosis present

## 2019-09-16 DIAGNOSIS — K8020 Calculus of gallbladder without cholecystitis without obstruction: Secondary | ICD-10-CM | POA: Diagnosis not present

## 2019-09-16 DIAGNOSIS — R109 Unspecified abdominal pain: Secondary | ICD-10-CM | POA: Diagnosis not present

## 2019-09-16 DIAGNOSIS — Z209 Contact with and (suspected) exposure to unspecified communicable disease: Secondary | ICD-10-CM | POA: Diagnosis not present

## 2019-09-16 LAB — COMPREHENSIVE METABOLIC PANEL
ALT: 39 U/L (ref 0–44)
AST: 29 U/L (ref 15–41)
Albumin: 4 g/dL (ref 3.5–5.0)
Alkaline Phosphatase: 63 U/L (ref 38–126)
Anion gap: 7 (ref 5–15)
BUN: 8 mg/dL (ref 6–20)
CO2: 27 mmol/L (ref 22–32)
Calcium: 9.1 mg/dL (ref 8.9–10.3)
Chloride: 105 mmol/L (ref 98–111)
Creatinine, Ser: 1.09 mg/dL (ref 0.61–1.24)
GFR calc Af Amer: >60 mL/min (ref >60)
GFR calc non Af Amer: >60 mL/min (ref >60)
Glucose, Bld: 87 mg/dL (ref 70–99)
Potassium: 4.6 mmol/L (ref 3.5–5.1)
Sodium: 139 mmol/L (ref 135–145)
Total Bilirubin: 1.9 mg/dL — ABNORMAL HIGH (ref 0.3–1.2)
Total Protein: 7.3 g/dL (ref 6.5–8.1)

## 2019-09-16 LAB — TYPE AND SCREEN
ABO/RH(D): A POS
Antibody Screen: NEGATIVE

## 2019-09-16 LAB — URINALYSIS, ROUTINE W REFLEX MICROSCOPIC
Bilirubin Urine: NEGATIVE
Glucose, UA: NEGATIVE mg/dL
Hgb urine dipstick: NEGATIVE
Ketones, ur: NEGATIVE mg/dL
Leukocytes,Ua: NEGATIVE
Nitrite: NEGATIVE
Protein, ur: NEGATIVE mg/dL
Specific Gravity, Urine: 1.034 — ABNORMAL HIGH (ref 1.005–1.030)
pH: 9 — ABNORMAL HIGH (ref 5.0–8.0)

## 2019-09-16 LAB — CBC
HCT: 44.9 % (ref 39.0–52.0)
Hemoglobin: 14.7 g/dL (ref 13.0–17.0)
MCH: 29.8 pg (ref 26.0–34.0)
MCHC: 32.7 g/dL (ref 30.0–36.0)
MCV: 91.1 fL (ref 80.0–100.0)
Platelets: 169 10*3/uL (ref 150–400)
RBC: 4.93 MIL/uL (ref 4.22–5.81)
RDW: 12.4 % (ref 11.5–15.5)
WBC: 8.8 10*3/uL (ref 4.0–10.5)
nRBC: 0 % (ref 0.0–0.2)

## 2019-09-16 LAB — ABO/RH: ABO/RH(D): A POS

## 2019-09-16 LAB — POC OCCULT BLOOD, ED: Fecal Occult Bld: NEGATIVE

## 2019-09-16 MED ORDER — AZITHROMYCIN 500 MG PO TABS: 500.0000 mg | ORAL_TABLET | Freq: Every day | ORAL | 0 refills | Status: AC

## 2019-09-16 MED ORDER — MORPHINE SULFATE (PF) 4 MG/ML IV SOLN
4.0000 mg | Freq: Once | INTRAVENOUS | Status: AC
  Administered 2019-09-16: 4 mg via INTRAVENOUS
  Filled 2019-09-16: qty 1

## 2019-09-16 MED ORDER — IOHEXOL 300 MG/ML  SOLN
100.0000 mL | Freq: Once | INTRAMUSCULAR | Status: AC | PRN
  Administered 2019-09-16: 100 mL via INTRAVENOUS

## 2019-09-16 MED ORDER — SODIUM CHLORIDE 0.9 % IV BOLUS
1000.0000 mL | Freq: Once | INTRAVENOUS | Status: AC
  Administered 2019-09-16: 1000 mL via INTRAVENOUS

## 2019-09-16 MED ORDER — BENZONATATE 100 MG PO CAPS: 100.0000 mg | ORAL_CAPSULE | Freq: Three times a day (TID) | ORAL | 0 refills | Status: DC

## 2019-09-16 MED ORDER — ONDANSETRON HCL 4 MG/2ML IJ SOLN
4.0000 mg | Freq: Once | INTRAMUSCULAR | Status: AC
  Administered 2019-09-16: 4 mg via INTRAVENOUS
  Filled 2019-09-16: qty 2

## 2019-09-16 NOTE — ED Notes (Signed)
Ask patient for urine sample, he stated that he did not need to urinate at this time.

## 2019-09-16 NOTE — ED Triage Notes (Signed)
Pt reports cough and fever x 2 days.  Seen at [facility] yesterday.  Reports bright red blood in stool that started today.  Denies pain.

## 2019-09-16 NOTE — ED Provider Notes (Signed)
Emergency Department Provider Note   I have reviewed the triage vital signs and the nursing notes.   HISTORY  Chief Complaint Rectal Bleeding   HPI Franklin [name] is a 39 y.o. male with PMH of Schizophrenia presents emergency department for evaluation of upper respiratory infection symptoms with fever and new onset abdominal discomfort with blood in the bowel movements.  Patient has had 3 days of symptoms.  He was seen at urgent care yesterday and tested for Covid with PCR test coming back negative.  He denies any chest pain or shortness of breath.  He has coughing and states that his abdomen and chest hurt with coughing only but not at rest.  This morning he went to the bathroom and had watery diarrhea and saw blood in the toilet water.  Denies any passage of formed stool or black tarry stools.  He feels some chills at times.   Past Medical History:  Diagnosis Date  . Bipolar 1 disorder ([place])   . Chronic headaches   . Conversion disorder with seizures or convulsions   . 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 Depression   . Hx of electroencephalogram 12/2014   normal  . Mild cognitive impairment   . Psychogenic nonepileptic seizure   . Schizophrenic disorder ([place])   . Seizures [facility])     Patient Active Problem List   Diagnosis Date Noted  . Migraine without aura and without status migrainosus, not intractable 05/22/2018  . Vertigo 05/22/2018  . Psychogenic nonepileptic seizure 02/18/2017  . Cholelithiasis 01/24/2017  . 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 Spells of decreased attentiveness 03/16/2016  . Abnormal EKG 11/30/2015  . Elevated troponin 11/30/2015  . Bipolar affective disorder, most recent episode unspecified type, remission status unspecified 03/11/2015  . Bipolar affective disorder ([place]) 01/08/2015  . Generalized idiopathic epilepsy and epileptic  syndromes, without status epilepticus, not intractable ([place]) 11/27/2014  . Seizure disorder ([place]) 10/14/2014    History reviewed. No pertinent surgical history.  Allergies Coconut oil, Other, Codeine, Depakote [divalproex sodium], Hydroxyzine, Penicillins, Tape, Latex, and Levetiracetam  Family History  Problem Relation Age of Onset  . Heart disease Mother     Social History Social History   Tobacco Use  . Smoking status: Never Smoker  . Smokeless tobacco: Never Used  Substance Use Topics  . Alcohol use: Not Currently    Alcohol/week: 0.0 standard drinks  . Drug use: No    Review of Systems  Constitutional: No fever/chills Eyes: No visual changes. ENT: No sore throat. Positive congestion.  Cardiovascular: Denies chest pain. Respiratory: Denies shortness of breath. Positive cough.  Gastrointestinal: Positive abdominal pain. Positive nausea, no vomiting. Watery diarrhea with BRB.  No constipation. Genitourinary: Negative for dysuria. Musculoskeletal: Negative for back pain. Skin: Negative for rash. Neurological: Negative for headaches.  10-point ROS otherwise negative.  ____________________________________________   PHYSICAL EXAM:  VITAL SIGNS: ED Triage Vitals  Enc Vitals Group     BP 09/16/19 1211 129/81     Pulse Rate 09/16/19 1211 65     Resp 09/16/19 1211 18     Temp 09/16/19 1211 98.9 F (37.2 C)     Temp Source 09/16/19 1211 Oral     SpO2 09/16/19 1211 100 %     Weight 09/16/19 1211 155 lb (70.3 kg)     Height 09/16/19 1211 6' (1.829 m)   Constitutional: Alert and oriented. Well appearing  and in no acute distress. Eyes: Conjunctivae are normal.  Head: Atraumatic. Nose: No congestion/rhinnorhea. Mouth/Throat: Mucous membranes are moist.  Neck: No stridor.  Cardiovascular: Normal rate, regular rhythm. Good peripheral circulation. Grossly normal heart sounds.   Respiratory: Normal respiratory effort.  No retractions. Lungs CTAB. Gastrointestinal:  Soft with mild upper abdominal tenderness. No distention.  Rectal exam performed with patient verbal consent and nurse tech chaperone.  No visible hemorrhoids or fissures.  No bright red blood or melena on exam. Hemoccult negative.  Musculoskeletal: No lower extremity tenderness nor edema. No gross deformities of extremities. Neurologic:  Normal speech and language. No gross focal neurologic deficits are appreciated.  Skin:  Skin is warm, dry and intact. No rash noted.  ____________________________________________   LABS (all labs ordered are listed, but only abnormal results are displayed)  Labs Reviewed  URINE CULTURE - Abnormal; Notable for the following components:      Result Value   Culture   (*)    Value: <10,000 COLONIES/mL INSIGNIFICANT GROWTH Performed at [facility], [place] [address] [place], [place] [address]    All other components within normal limits  COMPREHENSIVE METABOLIC PANEL - Abnormal; Notable for the following components:   Total Bilirubin 1.9 (*)    All other components within normal limits  URINALYSIS, ROUTINE W REFLEX MICROSCOPIC - Abnormal; Notable for the following components:   Specific Gravity, Urine 1.034 (*)    pH 9.0 (*)    All other components within normal limits  CBC  POC OCCULT BLOOD, ED  TYPE AND SCREEN  ABO/RH   ____________________________________________  RADIOLOGY  CT imaging reviewed.  ____________________________________________   PROCEDURES  Procedure(s) performed:   Procedures  None ____________________________________________   INITIAL IMPRESSION / ASSESSMENT AND PLAN / ED COURSE  Pertinent labs & imaging results that were available during my care of the patient were reviewed by me and considered in my medical decision making (see chart for details).   Patient presents emergency department for evaluation of abdominal pain with bright red blood in the watery stool.  Patient with URI symptoms but negative PCR Covid  test at urgent care yesterday.  Do not plan on repeating this.  Patient does have some upper abdominal tenderness but no clear source of rectal bleeding.  Hemoccult is negative.  Hemoglobin is within normal limits and no leukocytosis on labs.  Plan for UA to assess for possible hematuria as a cause for the patient's blood in the toilet water.  Will obtain CT scan of the abdomen and pelvis given his abdominal tenderness and pain with report of fever.  Will evaluate for diverticulitis/colitis.  Will also obtain chest x-ray with persistent cough and negative Covid test to rule out infiltrate.   CT imaging reviewed. Will start Azithromycin for infectious diarrhea along with tessalon for cough. UA pending. Care transferred to Dr. Sherry Ruffing.  ____________________________________________  FINAL CLINICAL IMPRESSION(S) / ED DIAGNOSES  Final diagnoses:  Enterocolitis     MEDICATIONS GIVEN DURING THIS VISIT:  Medications  sodium chloride 0.9 % bolus 1,000 mL (0 mLs Intravenous Stopped 09/16/19 1545)  ondansetron (ZOFRAN) injection 4 mg (4 mg Intravenous Given 09/16/19 1415)  morphine 4 MG/ML injection 4 mg (4 mg Intravenous Given 09/16/19 1415)  iohexol (OMNIPAQUE) 300 MG/ML solution 100 mL (100 mLs Intravenous Contrast Given 09/16/19 1514)     NEW OUTPATIENT MEDICATIONS STARTED DURING THIS VISIT:  Discharge Medication List as of 09/16/2019  8:39 PM    START taking these medications  Details  azithromycin (ZITHROMAX) 500 MG tablet Take 1 tablet (500 mg total) by mouth daily for 3 days. Take first 2 tablets together, then 1 every day until finished., Starting Sun 09/16/2019, Until Wed 09/19/2019, Normal        Note:  This document was prepared using Dragon voice recognition software and may include unintentional dictation errors.  Nanda Quinton, MD, [facility] Emergency Medicine    Manasi Dishon, W[facility], MD 09/18/19 [phone]

## 2019-09-16 NOTE — ED Notes (Signed)
Patient transported to CT 

## 2019-09-16 NOTE — ED Notes (Signed)
Ask patient for urine sample, patient stated he did not need to urinate at this time.  

## 2019-09-16 NOTE — ED Notes (Signed)
Ask patient for urine sample, patient stated that he did not need to urinate at the time.

## 2019-09-16 NOTE — ED Provider Notes (Signed)
Care assumed from Dr. Laverta Baltimore.  At time of transfer of care, patient is awaiting results of urinalysis prior to discharge.  Patient was found to have enterocolitis likely contributing to his symptoms and will be started on antibiotics at discharge with azithromycin.  Was waiting on urinalysis prior to discharge to make sure there is not concomitant UTI.  Urinalysis was returned showing no evidence of UTI.  Patient was discharged with prescription for azithromycin and follow-up with PCP.   8:41 PM As I was discharging patient, he complained of some cough.  He requested cough medicine.  He was prescribed Tessalon and will follow up with PCP as previously planned.   Clinical Impression: 1. Enterocolitis     Disposition: Discharge  Condition: Good  I have discussed the results, Dx and Tx plan with the pt(& family if present). He/she/they expressed understanding and agree(s) with the plan. Discharge instructions discussed at great length. Strict return precautions discussed and pt &/or family have verbalized understanding of the instructions. No further questions at time of discharge.    New Prescriptions   AZITHROMYCIN (ZITHROMAX) 500 MG TABLET    Take 1 tablet (500 mg total) by mouth daily for 3 days. Take first 2 tablets together, then 1 every day until finished.    Follow Up: Charlott Rakes, MD [place] [address] [phone]  Call in 1 day   [place] [address] [id number] [place] [phone]  If symptoms worsen     Shabria Egley, Gwenyth Allegra, MD 09/16/19 2042

## 2019-09-16 NOTE — Discharge Instructions (Signed)
You were seen in the emergency department today with blood in the diarrhea and abdominal pain.  I am starting you on antibiotics for the next 3 days.  Follow with your primary care doctor in the coming week.  Return emergency department worsening fever, chills, abdominal pain.

## 2019-09-17 LAB — CULTURE, GROUP A STREP (THRC)

## 2019-09-18 ENCOUNTER — Other Ambulatory Visit: Payer: Self-pay

## 2019-09-18 ENCOUNTER — Ambulatory Visit: Payer: Medicare HMO | Attending: Family Medicine | Admitting: Family Medicine

## 2019-09-18 DIAGNOSIS — J069 Acute upper respiratory infection, unspecified: Secondary | ICD-10-CM

## 2019-09-18 DIAGNOSIS — J3489 Other specified disorders of nose and nasal sinuses: Secondary | ICD-10-CM

## 2019-09-18 LAB — URINE CULTURE: Culture: <10000 — AB

## 2019-09-18 MED ORDER — LORATADINE 10 MG PO TABS: 10.0000 mg | ORAL_TABLET | Freq: Every day | ORAL | 1 refills | Status: DC

## 2019-09-18 NOTE — Progress Notes (Signed)
States that he had a negative covid test but was told to stay at home due to having symptoms.  When he cough he has pain in his chest.

## 2019-09-18 NOTE — Progress Notes (Signed)
Virtual Visit via Telephone Note  I connected with Franklin [name], on 09/18/2019 at 8:34 AM by telephone due to the COVID-19 pandemic and verified that I am speaking with the correct person using two identifiers.   Consent: I discussed the limitations, risks, security and privacy concerns of performing an evaluation and management service by telephone and the availability of in person appointments. I also discussed with the patient that there may be a patient responsible charge related to this service. The patient expressed understanding and agreed to proceed.   Location of Patient: Home  Location of Provider: Clinic   Persons participating in Telemedicine visit: Khalon Clyde Lundborg [place]-CMA Dr. [name]     History of Present Illness: Franklin Howardis a 39 year old male with a history of psychogenic nonepileptic spells, bipolar disorder, mild cognitive impairment   He complains of a 3 cough which is not productive even though he feel he has mucus in chest which he is unable to bring up and this is associated with chest pain and dyspnea unrelated to activity.  Denies presence of wheezing, fever, myalgia, sinus pressure but does have postnasal drip and some rhinorrhea Seen at the UC on 09/15/19 for viral URI and Ct abdomen findings of Enterocolitis prescribed Azithromycin and Tessalon perles which he states have been ineffective.  His COVID-19 test was negative, urine negative for UTI, chest x-ray negative for acute intracranial process.   Past Medical History:  Diagnosis Date  . Bipolar 1 disorder ([place])   . Chronic headaches   . Conversion disorder with seizures or convulsions   . 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 Depression   . Hx of electroencephalogram 12/2014   normal  . Mild cognitive impairment   . Psychogenic nonepileptic seizure   . Schizophrenic disorder ([place])   . Seizures ([facility])     Allergies  Allergen Reactions  . Coconut Oil Anaphylaxis and Nausea And Vomiting    Reaction to any kind of coconut  . Other Anaphylaxis    Raisins  . Codeine Nausea And Vomiting  . Depakote [Divalproex Sodium] Other (See Comments)    Pt states that this medication makes him feel like he is "out of it"  . Hydroxyzine Other (See Comments)    Other reaction(s): Dizziness (intolerance) "passed out"  . Penicillins Nausea And Vomiting    Has patient had a PCN reaction causing immediate rash, facial/tongue/throat swelling, SOB or lightheadedness with hypotension: No Has patient had a PCN reaction causing severe rash involving mucus membranes or skin necrosis: No Has patient had a PCN reaction that required hospitalization No Has patient had a PCN reaction occurring within the last 10 years: No If all of the above answers are "NO", then may proceed with Cephalosporin use.    . Tape Other (See Comments)    TEARS THE SKIN; only "paper tape" is tolerated  . Latex Rash  . Levetiracetam Nausea And Vomiting    Current Outpatient Medications on File Prior to Visit  Medication Sig Dispense Refill  . azithromycin (ZITHROMAX) 500 MG tablet Take 1 tablet (500 mg total) by mouth daily for 3 days. Take first 2 tablets together, then 1 every day until finished. 3 tablet 0  . benzonatate (TESSALON) 100 MG capsule Take 1 capsule (100 mg total) by mouth every 8 (eight) hours. 21 capsule 0  . lurasidone (LATUDA) 20 MG TABS tablet Take 40 mg by mouth daily.     Marland Kitchen  ALPRAZolam (XANAX) 1 MG tablet Take 1mg  Xanax 30 minutes before MRI study (Patient not taking: Reported on 09/18/2019) 1 tablet 0  . [DISCONTINUED] Oxcarbazepine (TRILEPTAL) 300 MG tablet Take 1/2 tablet twice a day (Patient not taking: Reported on 07/08/2019) 30 tablet 11   No current facility-administered medications on file prior to visit.    Observations/Objective: Awake, alert, oriented x3 Not in acute distress  Assessment and  Plan: 1. Viral URI With underlying sinus drainage Advised to complete course of azithromycin which was prescribed at urgent care Continue Tessalon Perles  2. Sinus drainage This could explain his ongoing cough We will commence on antihistamine Continue Tessalon Perles Contact the clinic if symptoms do not improve. - loratadine (CLARITIN) 10 MG tablet; Take 1 tablet (10 mg total) by mouth daily.  Dispense: 30 tablet; Refill: 1   Follow Up Instructions: Keep previously scheduled appointment for chronic medical condition   I discussed the assessment and treatment plan with the patient. The patient was provided an opportunity to ask questions and all were answered. The patient agreed with the plan and demonstrated an understanding of the instructions.   The patient was advised to call back or seek an in-person evaluation if the symptoms worsen or if the condition fails to improve as anticipated.     I provided 11 minutes total of non-face-to-face time during this encounter including median intraservice time, reviewing previous notes, investigations, ordering medications, medical decision making, coordinating care and patient verbalized understanding at the end of the visit.     Charlott Rakes, MD, FAAFP. [facility], [place]   09/18/2019, 8:34 AM

## 2019-09-20 DIAGNOSIS — F250 Schizoaffective disorder, bipolar type: Secondary | ICD-10-CM | POA: Diagnosis not present

## 2019-10-03 DIAGNOSIS — F250 Schizoaffective disorder, bipolar type: Secondary | ICD-10-CM | POA: Diagnosis not present

## 2019-10-10 ENCOUNTER — Encounter (HOSPITAL_COMMUNITY): Payer: Self-pay | Admitting: Emergency Medicine

## 2019-10-10 ENCOUNTER — Emergency Department (HOSPITAL_COMMUNITY)
Admission: EM | Admit: 2019-10-10 | Discharge: 2019-10-10 | Disposition: A | Discharge status: Home / Self Care | Payer: Medicare HMO | Attending: Emergency Medicine | Admitting: Emergency Medicine

## 2019-10-10 DIAGNOSIS — F445 Conversion disorder with seizures or convulsions: Secondary | ICD-10-CM | POA: Insufficient documentation

## 2019-10-10 DIAGNOSIS — Z91040 Latex allergy status: Secondary | ICD-10-CM | POA: Diagnosis not present

## 2019-10-10 DIAGNOSIS — F319 Bipolar disorder, unspecified: Secondary | ICD-10-CM | POA: Diagnosis not present

## 2019-10-10 DIAGNOSIS — R0902 Hypoxemia: Secondary | ICD-10-CM | POA: Diagnosis not present

## 2019-10-10 DIAGNOSIS — I10 Essential (primary) hypertension: Secondary | ICD-10-CM | POA: Diagnosis not present

## 2019-10-10 DIAGNOSIS — R404 Transient alteration of awareness: Secondary | ICD-10-CM | POA: Diagnosis not present

## 2019-10-10 DIAGNOSIS — Z79899 Other long term (current) drug therapy: Secondary | ICD-10-CM | POA: Diagnosis not present

## 2019-10-10 DIAGNOSIS — R569 Unspecified convulsions: Secondary | ICD-10-CM | POA: Diagnosis not present

## 2019-10-10 DIAGNOSIS — R456 Violent behavior: Secondary | ICD-10-CM | POA: Diagnosis not present

## 2019-10-10 LAB — CBC
HCT: 42.8 % (ref 39.0–52.0)
Hemoglobin: 14.4 g/dL (ref 13.0–17.0)
MCH: 30.3 pg (ref 26.0–34.0)
MCHC: 33.6 g/dL (ref 30.0–36.0)
MCV: 89.9 fL (ref 80.0–100.0)
Platelets: 178 10*3/uL (ref 150–400)
RBC: 4.76 MIL/uL (ref 4.22–5.81)
RDW: 12.2 % (ref 11.5–15.5)
WBC: 3.7 10*3/uL — ABNORMAL LOW (ref 4.0–10.5)
nRBC: 0 % (ref 0.0–0.2)

## 2019-10-10 LAB — BASIC METABOLIC PANEL
Anion gap: 11 (ref 5–15)
BUN: 15 mg/dL (ref 6–20)
CO2: 25 mmol/L (ref 22–32)
Calcium: 9.6 mg/dL (ref 8.9–10.3)
Chloride: 102 mmol/L (ref 98–111)
Creatinine, Ser: 0.94 mg/dL (ref 0.61–1.24)
GFR calc Af Amer: >60 mL/min (ref >60)
GFR calc non Af Amer: >60 mL/min (ref >60)
Glucose, Bld: 89 mg/dL (ref 70–99)
Potassium: 4.1 mmol/L (ref 3.5–5.1)
Sodium: 138 mmol/L (ref 135–145)

## 2019-10-10 LAB — CBG MONITORING, ED: Glucose-Capillary: 84 mg/dL (ref 70–99)

## 2019-10-10 LAB — MAGNESIUM: Magnesium: 2 mg/dL (ref 1.7–2.4)

## 2019-10-10 NOTE — ED Triage Notes (Addendum)
Pt her e from home with c/o seizure like activity , pt has hx of bipolar and schizophrenic , pt is not  consistent with taking his meds

## 2019-10-10 NOTE — Discharge Instructions (Addendum)
Follow back up with your primary care doctor.  Return for any new or worse symptoms.  Today's vital signs and labs without any significant abnormalities.

## 2019-10-10 NOTE — ED Provider Notes (Addendum)
[facility] DE[facility]MENT Provider Note   CSN: [id number] Arrival date & time: 10/10/19  1329     History No chief complaint on file.   Franklin [name] is a 39 y.o. male.  Patient with a known history of psychogenic seizures.  Patient brought in from home by EMS for seizure-like activity.  Patient had no incontinence did not bite his tongue.  Has a history of bipolar and schizophrenic.  Patient not consistent with meds.  Patient does not appear to be psychotic.  Patient denies any symptoms currently.  Patient denies feeling bad earlier in the week or today before the seizure.        Past Medical History:  Diagnosis Date  . Bipolar 1 disorder ([place])   . Chronic headaches   . Conversion disorder with seizures or convulsions   . 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 Depression   . Hx of electroencephalogram 12/2014   normal  . Mild cognitive impairment   . Psychogenic nonepileptic seizure   . Schizophrenic disorder ([place])   . Seizures [facility])     Patient Active Problem List   Diagnosis Date Noted  . Migraine without aura and without status migrainosus, not intractable 05/22/2018  . Vertigo 05/22/2018  . Psychogenic nonepileptic seizure 02/18/2017  . Cholelithiasis 01/24/2017  . 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 Spells of decreased attentiveness 03/16/2016  . Abnormal EKG 11/30/2015  . Elevated troponin 11/30/2015  . Bipolar affective disorder, most recent episode unspecified type, remission status unspecified 03/11/2015  . Bipolar affective disorder ([place]) 01/08/2015  . Generalized idiopathic epilepsy and epileptic syndromes, without status epilepticus, not intractable ([place]) 11/27/2014  . Seizure disorder ([place]) 10/14/2014    History reviewed. No pertinent surgical history.     Family History  Problem Relation Age of Onset  . Heart  disease Mother     Social History   Tobacco Use  . Smoking status: Never Smoker  . Smokeless tobacco: Never Used  Substance Use Topics  . Alcohol use: Not Currently    Alcohol/week: 0.0 standard drinks  . Drug use: No    Home Medications Prior to Admission medications   Medication Sig Start Date End Date Taking? Authorizing Provider  ALPRAZolam Duanne Moron) 1 MG tablet Take 1mg  Xanax 30 minutes before MRI study Patient not taking: Reported on 09/18/2019 08/23/19   Elsie Stain, MD  benzonatate (TESSALON) 100 MG capsule Take 1 capsule (100 mg total) by mouth every 8 (eight) hours. 09/16/19   Tegeler, Gwenyth Allegra, MD  loratadine (CLARITIN) 10 MG tablet Take 1 tablet (10 mg total) by mouth daily. 09/18/19   Charlott Rakes, MD  lurasidone (LATUDA) 20 MG TABS tablet Take 40 mg by mouth daily.     [provider]  Oxcarbazepine (TRILEPTAL) 300 MG tablet Take 1/2 tablet twice a day Patient not taking: Reported on 07/08/2019 01/26/18 07/09/19  Argentina Donovan, PA-C    Allergies    Coconut oil, Other, Codeine, Depakote [divalproex sodium], Hydroxyzine, Penicillins, Tape, Latex, and Levetiracetam  Review of Systems   Review of Systems  Constitutional: Negative for chills and fever.  HENT: Negative for congestion, rhinorrhea and sore throat.   Eyes: Negative for visual disturbance.  Respiratory: Negative for cough and shortness of breath.   Cardiovascular: Negative for chest pain and leg swelling.  Gastrointestinal: Negative for abdominal pain, diarrhea, nausea and vomiting.  Genitourinary: Negative for  dysuria.  Musculoskeletal: Negative for back pain and neck pain.  Skin: Negative for rash.  Neurological: Positive for seizures. Negative for dizziness, speech difficulty, weakness, light-headedness and headaches.  Hematological: Does not bruise/bleed easily.  Psychiatric/Behavioral: Negative for confusion, hallucinations and suicidal ideas.    Physical Exam Updated Vital  Signs BP 120/81   Pulse 60   Temp 98.8 F (37.1 C)   Resp 18   SpO2 99%   Physical Exam Vitals and nursing note reviewed.  Constitutional:      Appearance: Normal appearance. He is well-developed.  HENT:     Head: Normocephalic and atraumatic.  Eyes:     Extraocular Movements: Extraocular movements intact.     Conjunctiva/sclera: Conjunctivae normal.     Pupils: Pupils are equal, round, and reactive to light.  Cardiovascular:     Rate and Rhythm: Normal rate and regular rhythm.     Heart sounds: No murmur.  Pulmonary:     Effort: Pulmonary effort is normal. No respiratory distress.     Breath sounds: Normal breath sounds.  Abdominal:     Palpations: Abdomen is soft.     Tenderness: There is no abdominal tenderness.  Musculoskeletal:        General: No swelling. Normal range of motion.     Cervical back: Normal range of motion and neck supple. No tenderness.  Skin:    General: Skin is warm and dry.  Neurological:     General: No focal deficit present.     Mental Status: He is alert and oriented to person, place, and time.     Cranial Nerves: No cranial nerve deficit.     Sensory: No sensory deficit.     Motor: No weakness.     ED Results / Procedures / Treatments   Labs (all labs ordered are listed, but only abnormal results are displayed) Labs Reviewed  CBC  BASIC METABOLIC PANEL  MAGNESIUM  CBG MONITORING, ED    EKG EKG Interpretation  Date/Time:  Wednesday Oct 10 2019 13:35:40 EDT Ventricular Rate:  62 PR Interval:    QRS Duration: 117 QT Interval:  409 QTC Calculation: 416 R Axis:   -8 Text Interpretation: Sinus rhythm Probable left ventricular hypertrophy Confirmed by Fredia Sorrow [phone]) on 10/10/2019 2:37:06 PM   Radiology No results found.  Procedures Procedures (including critical care time)  Medications Ordered in ED Medications - No data to display  ED Course  I have reviewed the triage vital signs and the nursing  notes.  Pertinent labs & imaging results that were available during my care of the patient were reviewed by me and considered in my medical decision making (see chart for details).    MDM Rules/Calculators/A&P                      Patient with a history of psychogenic seizures.  Does not have a true seizure disorder.  No injuries from the seizure.  No incontinence.  Neuro exam completely normal here.  No focal deficits.  Patient does not appear to be floridly psychotic.  Will check CBC basic metabolic panel if normal will discharge home.  Blood sugar was fine here vital signs without any acute abnormalities.  Patient has primary care doctor to follow-up with.   Labs without any acute findings CBC basic metabolic panel magnesium all normal.  Will discharge back to primary care doctor  Final Clinical Impression(s) / ED Diagnoses Final diagnoses:  Psychogenic nonepileptic seizure  Rx / DC Orders ED Discharge Orders    None       Fredia Sorrow, MD 10/10/19 1441    Fredia Sorrow, MD 10/10/19 [phone]

## 2019-10-11 ENCOUNTER — Emergency Department (HOSPITAL_COMMUNITY): Payer: Medicare HMO

## 2019-10-11 ENCOUNTER — Emergency Department (HOSPITAL_COMMUNITY)
Admission: EM | Admit: 2019-10-11 | Discharge: 2019-10-11 | Disposition: A | Discharge status: Home / Self Care | Payer: Medicare HMO | Attending: Emergency Medicine | Admitting: Emergency Medicine

## 2019-10-11 ENCOUNTER — Encounter (HOSPITAL_COMMUNITY): Payer: Self-pay

## 2019-10-11 ENCOUNTER — Other Ambulatory Visit: Payer: Self-pay

## 2019-10-11 DIAGNOSIS — R464 Slowness and poor responsiveness: Secondary | ICD-10-CM | POA: Diagnosis not present

## 2019-10-11 DIAGNOSIS — R4189 Other symptoms and signs involving cognitive functions and awareness: Secondary | ICD-10-CM | POA: Insufficient documentation

## 2019-10-11 DIAGNOSIS — W19XXXA Unspecified fall, initial encounter: Secondary | ICD-10-CM | POA: Diagnosis not present

## 2019-10-11 DIAGNOSIS — R569 Unspecified convulsions: Secondary | ICD-10-CM | POA: Diagnosis not present

## 2019-10-11 DIAGNOSIS — Z91040 Latex allergy status: Secondary | ICD-10-CM | POA: Insufficient documentation

## 2019-10-11 DIAGNOSIS — R55 Syncope and collapse: Secondary | ICD-10-CM | POA: Diagnosis not present

## 2019-10-11 LAB — COMPREHENSIVE METABOLIC PANEL
ALT: 13 U/L (ref 0–44)
AST: 15 U/L (ref 15–41)
Albumin: 4.4 g/dL (ref 3.5–5.0)
Alkaline Phosphatase: 57 U/L (ref 38–126)
Anion gap: 7 (ref 5–15)
BUN: 14 mg/dL (ref 6–20)
CO2: 29 mmol/L (ref 22–32)
Calcium: 9.2 mg/dL (ref 8.9–10.3)
Chloride: 103 mmol/L (ref 98–111)
Creatinine, Ser: 0.91 mg/dL (ref 0.61–1.24)
GFR calc Af Amer: >60 mL/min (ref >60)
GFR calc non Af Amer: >60 mL/min (ref >60)
Glucose, Bld: 91 mg/dL (ref 70–99)
Potassium: 4 mmol/L (ref 3.5–5.1)
Sodium: 139 mmol/L (ref 135–145)
Total Bilirubin: 1.1 mg/dL (ref 0.3–1.2)
Total Protein: 7.4 g/dL (ref 6.5–8.1)

## 2019-10-11 LAB — CBC WITH DIFFERENTIAL/PLATELET
Abs Immature Granulocytes: 0.01 10*3/uL (ref 0.00–0.07)
Basophils Absolute: 0.1 10*3/uL (ref 0.0–0.1)
Basophils Relative: 1 %
Eosinophils Absolute: 0 10*3/uL (ref 0.0–0.5)
Eosinophils Relative: 1 %
HCT: 44.6 % (ref 39.0–52.0)
Hemoglobin: 14.8 g/dL (ref 13.0–17.0)
Immature Granulocytes: 0 %
Lymphocytes Relative: 26 %
Lymphs Abs: 1.1 10*3/uL (ref 0.7–4.0)
MCH: 30.1 pg (ref 26.0–34.0)
MCHC: 33.2 g/dL (ref 30.0–36.0)
MCV: 90.8 fL (ref 80.0–100.0)
Monocytes Absolute: 0.4 10*3/uL (ref 0.1–1.0)
Monocytes Relative: 9 %
Neutro Abs: 2.9 10*3/uL (ref 1.7–7.7)
Neutrophils Relative %: 63 %
Platelets: 179 10*3/uL (ref 150–400)
RBC: 4.91 MIL/uL (ref 4.22–5.81)
RDW: 12.2 % (ref 11.5–15.5)
WBC: 4.5 10*3/uL (ref 4.0–10.5)
nRBC: 0 % (ref 0.0–0.2)

## 2019-10-11 LAB — MAGNESIUM: Magnesium: 2.1 mg/dL (ref 1.7–2.4)

## 2019-10-11 MED ORDER — SODIUM CHLORIDE 0.9 % IV BOLUS
500.0000 mL | Freq: Once | INTRAVENOUS | Status: AC
  Administered 2019-10-11: 500 mL via INTRAVENOUS

## 2019-10-11 NOTE — ED Provider Notes (Signed)
Emergency Department Provider Note   I have reviewed the triage vital signs and the nursing notes.   HISTORY  Chief Complaint No chief complaint on file.   HPI Layla Defusco is a 39 y.o. male with past medical history reviewed below returns to the emergency department with episode concerning for seizure today.  Patient was seen in the emergency department yesterday after several events which were similar. Patient follows with neurology at [facility]  and in November of last year the patient's Trileptal was discontinued after EEG showing no seizure-like activity.  The suspicion at that time was that these were nonepileptiform seizures and patient was referred to psychiatry and PCP for further evaluation.  He tells me that he is called to schedule a follow-up appointment with his neurologist to discuss this further.  He does not drive a car.  The patient's wife is on the phone during my encounter and states that today she went into the bathroom and saw him laying on the ground and staring off.  There was no shaking.  This lasted for less than a minute and then patient returned to his normal self.  No lingering confusion.  No urine incontinence.  No tongue biting or injury.  Patient does describe feeling dizzy like room spinning intermittently and had some of that this morning.  He took meclizine with some improvement in symptoms.  He denies any chest pain, shortness of breath, heart palpitations.    Past Medical History:  Diagnosis Date  . Bipolar 1 disorder ([place])   . Chronic headaches   . Conversion disorder with seizures or convulsions   . 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 Depression   . Hx of electroencephalogram 12/2014   normal  . Mild cognitive impairment   . Psychogenic nonepileptic seizure   . Schizophrenic disorder ([place])   . Seizures [facility])     Patient Active Problem List   Diagnosis Date  Noted  . Migraine without aura and without status migrainosus, not intractable 05/22/2018  . Vertigo 05/22/2018  . Psychogenic nonepileptic seizure 02/18/2017  . Cholelithiasis 01/24/2017  . 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 Spells of decreased attentiveness 03/16/2016  . Abnormal EKG 11/30/2015  . Elevated troponin 11/30/2015  . Bipolar affective disorder, most recent episode unspecified type, remission status unspecified 03/11/2015  . Bipolar affective disorder ([place]) 01/08/2015  . Generalized idiopathic epilepsy and epileptic syndromes, without status epilepticus, not intractable ([place]) 11/27/2014  . Seizure disorder ([place]) 10/14/2014    History reviewed. No pertinent surgical history.  Allergies Coconut oil, Other, Codeine, Depakote [divalproex sodium], Hydroxyzine, Penicillins, Tape, Latex, and Levetiracetam  Family History  Problem Relation Age of Onset  . Heart disease Mother     Social History Social History   Tobacco Use  . Smoking status: Never Smoker  . Smokeless tobacco: Never Used  Substance Use Topics  . Alcohol use: Not Currently    Alcohol/week: 0.0 standard drinks  . Drug use: No    Review of Systems  Constitutional: No fever/chills. Question seizure activity.  Eyes: No visual changes. ENT: No sore throat. Cardiovascular: Denies chest pain. Respiratory: Denies shortness of breath. Gastrointestinal: No abdominal pain.  No nausea, no vomiting.  No diarrhea.  No constipation. Genitourinary: Negative for dysuria. Musculoskeletal: Negative for back pain. Skin: Negative for rash. Neurological: Negative for headaches, focal weakness or numbness.  10-point ROS otherwise negative.  ____________________________________________  PHYSICAL EXAM:  VITAL SIGNS: ED Triage Vitals  Enc Vitals Group     BP 10/11/19 1044 129/76     Pulse Rate 10/11/19 1044 (!) 59     Resp 10/11/19 1044 16     Temp 10/11/19 1044 98.1 F (36.7 C)      Temp Source 10/11/19 1044 Oral     SpO2 10/11/19 1044 100 %     Weight 10/11/19 1033 154 lb 15.7 oz (70.3 kg)     Height 10/11/19 1033 6' (1.829 m)   Constitutional: Alert and oriented. Well appearing and in no acute distress. Eyes: Conjunctivae are normal. PERRL.  Head: Atraumatic. Nose: No congestion/rhinnorhea. Mouth/Throat: Mucous membranes are moist. No tongue injury.  Neck: No stridor.  Cardiovascular: Normal rate, regular rhythm. Good peripheral circulation. Grossly normal heart sounds.   Respiratory: Normal respiratory effort.  No retractions. Lungs CTAB. Gastrointestinal: Soft and nontender. No distention.  Musculoskeletal: No lower extremity tenderness nor edema. No gross deformities of extremities. Neurologic:  Normal speech and language. No gross focal neurologic deficits are appreciated.  Skin:  Skin is warm, dry and intact. No rash noted.  ____________________________________________   LABS (all labs ordered are listed, but only abnormal results are displayed)  Labs Reviewed  COMPREHENSIVE METABOLIC PANEL  CBC WITH DIFFERENTIAL/PLATELET  MAGNESIUM   ____________________________________________  EKG   EKG Interpretation  Date/Time:  Thursday Oct 11 2019 10:44:07 EDT Ventricular Rate:  56 PR Interval:    QRS Duration: 116 QT Interval:  406 QTC Calculation: 392 R Axis:   4 Text Interpretation: Sinus rhythm Borderline short PR interval Nonspecific intraventricular conduction delay probable LVH Similar to prior. No STEMI Confirmed by Nanda Quinton [phone]) on 10/11/2019 11:15:14 AM       ____________________________________________  RADIOLOGY  DG Chest Portable 1 View  Result Date: 10/11/2019 CLINICAL DATA:  Syncope headache, seizures and weakness EXAM: PORTABLE CHEST 1 VIEW COMPARISON:  09/16/2019 FINDINGS: Cardiomediastinal contours and hilar structures are normal. Lungs are clear. No pleural effusion. Visualized skeletal structures are unremarkable.  IMPRESSION: No acute cardiopulmonary disease. Electronically Signed   By: Zetta Bills M.D.   On: 10/11/2019 11:42    ____________________________________________   PROCEDURES  Procedure(s) performed:   Procedures  None  ____________________________________________   INITIAL IMPRESSION / ASSESSMENT AND PLAN / ED COURSE  Pertinent labs & imaging results that were available during my care of the patient were reviewed by me and considered in my medical decision making (see chart for details).   Patient presents to the emergency department with question of breakthrough seizure.  He has seen neurology at [facility] last in November and his antiepileptic medications were discontinued in the setting of normal EEG and MRI.  Patient diagnosed with nonepileptiform seizures at that time and encouraged to follow with his psychiatrist and PCP.  Patient has no stigmata of seizure here.  Wife provides collateral.  We discussed to the neurology impression from November and that follow-up with that team is indicated for further discussion of symptoms and to further discuss the neurology impression.  I do not plan on repeat imaging of the head or loading a new antiepileptic medication.  Will work-up for possible cardiogenic syncope although this is lower on my differential.  EKG is reassuring and similar to prior tracings.  Some component of vertigo described patient has a normal neurologic exam and I suspect if anything this is peripheral and he will continue with his meclizine.  Plan for screening blood work and IV fluids.  Labs reviewed. Plan for PCP and Psychiatry f/u. Patient has outpatient f/u scheduled with Neurology as well in early June. Discussed ED return precautions.  ____________________________________________  FINAL CLINICAL IMPRESSION(S) / ED DIAGNOSES  Final diagnoses:  Episode of unresponsiveness    MEDICATIONS GIVEN DURING THIS VISIT:  Medications  sodium chloride 0.9 % bolus  500 mL (0 mLs Intravenous Stopped 10/11/19 1218)    Note:  This document was prepared using Dragon voice recognition software and may include unintentional dictation errors.  Nanda Quinton, MD, [facility]    Athleen Feltner, [facility], MD 10/12/19 [phone]

## 2019-10-11 NOTE — ED Triage Notes (Signed)
Pt BIB EMS from home. Pts roommate called out due to thinking pt had a seizure. Pt reports hitting his head on the bath tub and now endorses head pain. EMS denies any post-ictal activity. A&O x4. Hx of bipolar and schizophrenia.  110/80 HR 60 CBG 134 SpO2 99% RA

## 2019-10-11 NOTE — Discharge Instructions (Signed)
You were seen in the emergency department today after a seizure-like episode.  We reviewed your neurology recommendations from [facility] during your last visit and will have you follow with them as well as your primary care doctor and your psychiatry team. Your labs today were normal.

## 2019-10-23 DIAGNOSIS — F250 Schizoaffective disorder, bipolar type: Secondary | ICD-10-CM | POA: Diagnosis not present

## 2019-10-23 NOTE — Progress Notes (Signed)
Patient ID: Franklin [name], male   DOB: Jul 10, 1980, 39 y.o.   MRN: [id number]   Virtual Visit via Telephone Note  I connected with Franklin [name] on 10/24/19 at  2:50 PM EDT by telephone and verified that I am speaking with the correct person using two identifiers.   I discussed the limitations, risks, security and privacy concerns of performing an evaluation and management service by telephone and the availability of in person appointments. I also discussed with the patient that there may be a patient responsible charge related to this service. The patient expressed understanding and agreed to proceed.  PATIENT visit by telephone virtually in the context of Covid-19 pandemic. Patient location:  home My Location:  [facility] office Persons on the call:  Me and the patient  History of Present Illness: After breakthrough seizure seen in ED 10/11/2019.  He says he has been having "blacking out" spells but is poor historian overall.  He has not had any further episodes since 10/11/2019.  He does not have a psychiatry f/up appt scheduled but is scheduled to see his neurologist tomorrow.  Denies CP/SOB.  No HA.  No vision changes or dizziness.    From discharge summary: Patient presents to the emergency department with question of breakthrough seizure.  He has seen neurology at [facility] last in November and his antiepileptic medications were discontinued in the setting of normal EEG and MRI.  Patient diagnosed with nonepileptiform seizures at that time and encouraged to follow with his psychiatrist and PCP.  Patient has no stigmata of seizure here.  Wife provides collateral.  We discussed to the neurology impression from November and that follow-up with that team is indicated for further discussion of symptoms and to further discuss the neurology impression.  I do not plan on repeat imaging of the head or loading a new antiepileptic medication.  Will work-up for possible cardiogenic syncope although this is lower  on my differential.  EKG is reassuring and similar to prior tracings.  Some component of vertigo described patient has a normal neurologic exam and I suspect if anything this is peripheral and he will continue with his meclizine.  Plan for screening blood work and IV fluids.   Labs reviewed. Plan for PCP and Psychiatry f/u. Patient has outpatient f/u scheduled with Neurology as well in early June. Discussed ED return precautions   Observations/Objective:  NAD.  A&Ox3   Assessment and Plan: 1. Psychogenic nonepileptic seizure Sees neurology tomorrow-call 911 if any problems or LOC prior to that.  2. Encounter for examination following treatment at hospital   Follow Up Instructions: See PCP 2-3 months   I discussed the assessment and treatment plan with the patient. The patient was provided an opportunity to ask questions and all were answered. The patient agreed with the plan and demonstrated an understanding of the instructions.   The patient was advised to call back or seek an in-person evaluation if the symptoms worsen or if the condition fails to improve as anticipated.  I provided 12 minutes of non-face-to-face time during this encounter.   Freeman Caldron, PA-C

## 2019-10-24 ENCOUNTER — Ambulatory Visit: Payer: Medicare HMO | Attending: Family Medicine | Admitting: Physician Assistant

## 2019-10-24 ENCOUNTER — Other Ambulatory Visit: Payer: Self-pay

## 2019-10-24 DIAGNOSIS — F445 Conversion disorder with seizures or convulsions: Secondary | ICD-10-CM | POA: Diagnosis not present

## 2019-10-24 DIAGNOSIS — Z09 Encounter for follow-up examination after completed treatment for conditions other than malignant neoplasm: Secondary | ICD-10-CM

## 2019-10-25 ENCOUNTER — Other Ambulatory Visit: Payer: Self-pay

## 2019-10-25 ENCOUNTER — Emergency Department (HOSPITAL_COMMUNITY)
Admission: EM | Admit: 2019-10-25 | Discharge: 2019-10-25 | Disposition: A | Discharge status: Home / Self Care | Payer: Medicare HMO | Attending: Emergency Medicine | Admitting: Emergency Medicine

## 2019-10-25 DIAGNOSIS — Z760 Encounter for issue of repeat prescription: Secondary | ICD-10-CM | POA: Diagnosis not present

## 2019-10-25 DIAGNOSIS — Z79899 Other long term (current) drug therapy: Secondary | ICD-10-CM | POA: Diagnosis not present

## 2019-10-25 DIAGNOSIS — G40802 Other epilepsy, not intractable, without status epilepticus: Secondary | ICD-10-CM | POA: Diagnosis not present

## 2019-10-25 NOTE — ED Notes (Signed)
Patient verbalizes understanding of discharge instructions. Opportunity for questioning and answers were provided. Armband removed by staff, pt discharged from ED ambulatory to home.  

## 2019-10-25 NOTE — ED Triage Notes (Signed)
Pt here for refill of bipolar and schizophrenia medications. No other complaints.

## 2019-10-25 NOTE — ED Provider Notes (Signed)
[place] [facility] EMERGENCY DEPARTMENT Provider Note   CSN: [id number] Arrival date & time: 10/25/19  1646     History Chief Complaint  Patient presents with  . Medication Refill    Franklin [name] is a 39 y.o. male.  He says he needs a refill of his psychiatric medications but he is unable to tell me what he is on.  He said he is under a lot of stress with stuff going on at home.  He denies any suicidal ideations.  History of conversion disorder with seizures. Denies any chest pain or shortness of breath no fevers chills cough nausea vomiting diarrhea or urinary symptoms. The history is provided by the patient.  Medication Refill Multiple medications.  He says he is called the pharmacy and they say that the meds are not there.     Past Medical History:  Diagnosis Date  . Bipolar 1 disorder ([place])   . Chronic headaches   . Conversion disorder with seizures or convulsions   . 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 Depression   . Hx of electroencephalogram 12/2014   normal  . Mild cognitive impairment   . Psychogenic nonepileptic seizure   . Schizophrenic disorder ([place])   . Seizures [facility])     Patient Active Problem List   Diagnosis Date Noted  . Migraine without aura and without status migrainosus, not intractable 05/22/2018  . Vertigo 05/22/2018  . Psychogenic nonepileptic seizure 02/18/2017  . Cholelithiasis 01/24/2017  . 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 Spells of decreased attentiveness 03/16/2016  . Abnormal EKG 11/30/2015  . Elevated troponin 11/30/2015  . Bipolar affective disorder, most recent episode unspecified type, remission status unspecified 03/11/2015  . Bipolar affective disorder ([place]) 01/08/2015  . Generalized idiopathic epilepsy and epileptic syndromes, without status epilepticus, not intractable ([place]) 11/27/2014  . Seizure disorder  ([place]) 10/14/2014    No past surgical history on file.     Family History  Problem Relation Age of Onset  . Heart disease Mother     Social History   Tobacco Use  . Smoking status: Never Smoker  . Smokeless tobacco: Never Used  Substance Use Topics  . Alcohol use: Not Currently    Alcohol/week: 0.0 standard drinks  . Drug use: No    Home Medications Prior to Admission medications   Medication Sig Start Date End Date Taking? Authorizing Provider  ALPRAZolam Duanne Moron) 1 MG tablet Take 1mg  Xanax 30 minutes before MRI study Patient not taking: Reported on 09/18/2019 08/23/19   Elsie Stain, MD  benzonatate (TESSALON) 100 MG capsule Take 1 capsule (100 mg total) by mouth every 8 (eight) hours. Patient not taking: Reported on 10/11/2019 09/16/19   Tegeler, Gwenyth Allegra, MD  lamoTRIgine (LAMICTAL) 25 MG tablet Take 50 mg by mouth daily.    [provider]  LATUDA 40 MG TABS tablet Take 40 mg by mouth daily. 08/10/19   [provider]  loratadine (CLARITIN) 10 MG tablet Take 1 tablet (10 mg total) by mouth daily. Patient not taking: Reported on 10/11/2019 09/18/19   Charlott Rakes, MD  PRESCRIPTION MEDICATION Take 1 tablet by mouth as needed (dizziness). Prescription med for dizziness    [provider]  Oxcarbazepine (TRILEPTAL) 300 MG tablet Take 1/2 tablet twice a day Patient not taking: Reported on 07/08/2019 01/26/18 07/09/19  Argentina Donovan, PA-C    Allergies  Coconut oil, Other, Codeine, Depakote [divalproex sodium], Hydroxyzine, Penicillins, Tape, Latex, and Levetiracetam  Review of Systems   Review of Systems  Constitutional: Negative for fever.  HENT: Negative for sore throat.   Eyes: Negative for visual disturbance.  Respiratory: Negative for shortness of breath.   Cardiovascular: Negative for chest pain.  Gastrointestinal: Negative for abdominal pain.  Genitourinary: Negative for dysuria.  Musculoskeletal: Negative for neck pain.  Skin:  Negative for rash.  Neurological: Negative for syncope.    Physical Exam Updated Vital Signs BP 119/73 (BP Location: Right Arm)   Pulse (!) 58   Temp 98.9 F (37.2 C) (Oral)   Resp 16   SpO2 99%   Physical Exam Vitals and nursing note reviewed.  Constitutional:      Appearance: He is well-developed.  HENT:     Head: Normocephalic and atraumatic.  Eyes:     Conjunctiva/sclera: Conjunctivae normal.  Cardiovascular:     Rate and Rhythm: Normal rate and regular rhythm.     Heart sounds: No murmur.  Pulmonary:     Effort: Pulmonary effort is normal. No respiratory distress.     Breath sounds: Normal breath sounds.  Abdominal:     Palpations: Abdomen is soft.     Tenderness: There is no abdominal tenderness.  Musculoskeletal:        General: No deformity or signs of injury. Normal range of motion.     Cervical back: Neck supple.  Skin:    General: Skin is warm and dry.     Capillary Refill: Capillary refill takes less than 2 seconds.  Neurological:     General: No focal deficit present.     Mental Status: He is alert.  Psychiatric:        Mood and Affect: Mood normal.     ED Results / Procedures / Treatments   Labs (all labs ordered are listed, but only abnormal results are displayed) Labs Reviewed - No data to display  EKG None  Radiology No results found.  Procedures Procedures (including critical care time)  Medications Ordered in ED Medications - No data to display  ED Course  I have reviewed the triage vital signs and the nursing notes.  Pertinent labs & imaging results that were available during my care of the patient were reviewed by me and considered in my medical decision making (see chart for details).  Clinical Course as of Oct 24 2237  Thu Oct 25, 2019  1749 Patient here for refill of his psych meds although he is unable to tell me what meds he is on.  I do not see any recent peer health notes to help guide.  I have asked pharmacy to consult  to see if they can reconcile his med list.   [MB]  M[place] called the pharmacy and he has 3 meds they are waiting to be picked up.  I reviewed this with the patient.   [MB]    Clinical Course User Index [MB] Hayden Rasmussen, MD   MDM Rules/Calculators/A&P                     Differential includes psychiatric disorder, social problems, stress, financial Final Clinical Impression(s) / ED Diagnoses Final diagnoses:  Medication management    Rx / DC Orders ED Discharge Orders    None       Hayden Rasmussen, MD 10/25/19 2240

## 2019-10-25 NOTE — Discharge Instructions (Addendum)
Please go to the pharmacy and pick up your medications.  Follow-up with your primary care doctor.  Return to the emergency department for any worsening or concerning symptoms

## 2019-10-26 ENCOUNTER — Encounter (HOSPITAL_COMMUNITY): Payer: Self-pay | Admitting: Emergency Medicine

## 2019-10-26 ENCOUNTER — Emergency Department (HOSPITAL_COMMUNITY)
Admission: EM | Admit: 2019-10-26 | Discharge: 2019-10-26 | Disposition: A | Discharge status: Home / Self Care | Payer: Medicare HMO | Attending: Emergency Medicine | Admitting: Emergency Medicine

## 2019-10-26 DIAGNOSIS — G40909 Epilepsy, unspecified, not intractable, without status epilepticus: Secondary | ICD-10-CM | POA: Insufficient documentation

## 2019-10-26 DIAGNOSIS — R519 Headache, unspecified: Secondary | ICD-10-CM | POA: Diagnosis not present

## 2019-10-26 DIAGNOSIS — W19XXXA Unspecified fall, initial encounter: Secondary | ICD-10-CM | POA: Diagnosis not present

## 2019-10-26 DIAGNOSIS — R001 Bradycardia, unspecified: Secondary | ICD-10-CM | POA: Diagnosis not present

## 2019-10-26 DIAGNOSIS — Z5321 Procedure and treatment not carried out due to patient leaving prior to being seen by health care provider: Secondary | ICD-10-CM | POA: Insufficient documentation

## 2019-10-26 DIAGNOSIS — R55 Syncope and collapse: Secondary | ICD-10-CM | POA: Insufficient documentation

## 2019-10-26 DIAGNOSIS — R52 Pain, unspecified: Secondary | ICD-10-CM | POA: Diagnosis not present

## 2019-10-26 LAB — CBC
HCT: 44.3 % (ref 39.0–52.0)
Hemoglobin: 14.9 g/dL (ref 13.0–17.0)
MCH: 30 pg (ref 26.0–34.0)
MCHC: 33.6 g/dL (ref 30.0–36.0)
MCV: 89.3 fL (ref 80.0–100.0)
Platelets: 195 10*3/uL (ref 150–400)
RBC: 4.96 MIL/uL (ref 4.22–5.81)
RDW: 12.1 % (ref 11.5–15.5)
WBC: 4.4 10*3/uL (ref 4.0–10.5)
nRBC: 0 % (ref 0.0–0.2)

## 2019-10-26 LAB — BASIC METABOLIC PANEL
Anion gap: 7 (ref 5–15)
BUN: 9 mg/dL (ref 6–20)
CO2: 27 mmol/L (ref 22–32)
Calcium: 9.4 mg/dL (ref 8.9–10.3)
Chloride: 105 mmol/L (ref 98–111)
Creatinine, Ser: 0.98 mg/dL (ref 0.61–1.24)
GFR calc Af Amer: >60 mL/min (ref >60)
GFR calc non Af Amer: >60 mL/min (ref >60)
Glucose, Bld: 83 mg/dL (ref 70–99)
Potassium: 3.7 mmol/L (ref 3.5–5.1)
Sodium: 139 mmol/L (ref 135–145)

## 2019-10-26 MED ORDER — SODIUM CHLORIDE 0.9% FLUSH: 3.0000 mL | Freq: Once | INTRAVENOUS | Status: DC

## 2019-10-26 NOTE — ED Notes (Signed)
Pt stated wanted to go to his primary care doctor and wanted to leave.

## 2019-10-26 NOTE — ED Triage Notes (Signed)
Pt arrives via [facility] from home where he had an unwitnessed syncopal episode, pt was found unconscious. Denies blood thinners, does not remember incident. Pain to cercival and thoracic spine with tenderness to palpation, no neuro deficits noted. ccollar in place. Pt also endorses that he started a new epilepsy medication 2 days ago that is making him lethargic.

## 2019-12-02 ENCOUNTER — Other Ambulatory Visit: Payer: Self-pay

## 2019-12-02 ENCOUNTER — Ambulatory Visit (HOSPITAL_COMMUNITY)
Admission: EM | Admit: 2019-12-02 | Discharge: 2019-12-02 | Disposition: A | Discharge status: Home / Self Care | Payer: Medicare Other | Attending: Family Medicine | Admitting: Family Medicine

## 2019-12-02 ENCOUNTER — Ambulatory Visit (INDEPENDENT_AMBULATORY_CARE_PROVIDER_SITE_OTHER): Payer: Medicare Other

## 2019-12-02 ENCOUNTER — Encounter (HOSPITAL_COMMUNITY): Payer: Self-pay

## 2019-12-02 DIAGNOSIS — S67193A Crushing injury of left middle finger, initial encounter: Secondary | ICD-10-CM

## 2019-12-02 MED ORDER — DOXYCYCLINE HYCLATE 100 MG PO TABS
100.0000 mg | ORAL_TABLET | Freq: Two times a day (BID) | ORAL | 0 refills | Status: DC
Start: 2019-12-02 — End: 2019-12-29

## 2019-12-02 MED ORDER — TRAMADOL HCL 50 MG PO TABS: 50.0000 mg | ORAL_TABLET | Freq: Four times a day (QID) | ORAL | 0 refills | Status: DC | PRN

## 2019-12-02 NOTE — Discharge Instructions (Addendum)
X-ray shows no fracture.

## 2019-12-02 NOTE — ED Triage Notes (Signed)
Patient presents today with complaints left 3rd finger pain. Patient states he was helping move furniture yesterday when a washing machine fell on his left hand/finger cutting it. Patient states he got the bleeding to stop but noticed his finger swelling and hurting when he put pressure on it.

## 2019-12-02 NOTE — ED Provider Notes (Signed)
[place]    CSN: [id number] Arrival date & time: 12/02/19  1333      History   Chief Complaint Chief Complaint  Patient presents with  . Finger Injury    HPI Franklin [name] is a 39 y.o. male.   Established [place] patient with finger injury.  Patient presents today with complaints left 3rd finger pain. Patient states he was helping move furniture yesterday when a washing machine fell on his left hand/finger cutting it. Patient states he got the bleeding to stop but noticed his finger swelling and hurting when he put pressure on it.   Patient on disability.     Past Medical History:  Diagnosis Date  . Bipolar 1 disorder ([place])   . Chronic headaches   . Conversion disorder with seizures or convulsions   . 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 Depression   . Hx of electroencephalogram 12/2014   normal  . Mild cognitive impairment   . Psychogenic nonepileptic seizure   . Schizophrenic disorder ([place])   . Seizures [facility])     Patient Active Problem List   Diagnosis Date Noted  . Migraine without aura and without status migrainosus, not intractable 05/22/2018  . Vertigo 05/22/2018  . Psychogenic nonepileptic seizure 02/18/2017  . Cholelithiasis 01/24/2017  . 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 Spells of decreased attentiveness 03/16/2016  . Abnormal EKG 11/30/2015  . Elevated troponin 11/30/2015  . Bipolar affective disorder, most recent episode unspecified type, remission status unspecified 03/11/2015  . Bipolar affective disorder ([place]) 01/08/2015  . Generalized idiopathic epilepsy and epileptic syndromes, without status epilepticus, not intractable ([place]) 11/27/2014  . Seizure disorder ([place]) 10/14/2014    History reviewed. No pertinent surgical history.     Home Medications    Prior to Admission medications   Medication Sig Start Date End Date  Taking? Authorizing Provider  doxycycline (VIBRA-TABS) 100 MG tablet Take 1 tablet (100 mg total) by mouth 2 (two) times daily. 12/02/19   Robyn Haber, MD  traMADol (ULTRAM) 50 MG tablet Take 1 tablet (50 mg total) by mouth every 6 (six) hours as needed. 12/02/19   Robyn Haber, MD  loratadine (CLARITIN) 10 MG tablet Take 1 tablet (10 mg total) by mouth daily. Patient not taking: Reported on 10/11/2019 09/18/19 12/02/19  Charlott Rakes, MD  Oxcarbazepine (TRILEPTAL) 300 MG tablet Take 1/2 tablet twice a day Patient not taking: Reported on 07/08/2019 01/26/18 07/09/19  Argentina Donovan, PA-C    Family History Family History  Problem Relation Age of Onset  . Heart disease Mother     Social History Social History   Tobacco Use  . Smoking status: Never Smoker  . Smokeless tobacco: Never Used  Vaping Use  . Vaping Use: Never used  Substance Use Topics  . Alcohol use: Not Currently    Alcohol/week: 0.0 standard drinks  . Drug use: No     Allergies   Coconut oil, Other, Codeine, Depakote [divalproex sodium], Hydroxyzine, Penicillins, Tape, Latex, and Levetiracetam   Review of Systems Review of Systems  Constitutional: Negative.   Musculoskeletal: Positive for joint swelling.  Skin: Positive for wound.  All other systems reviewed and are negative.    Physical Exam Triage Vital Signs ED Triage Vitals  Enc Vitals Group     BP 12/02/19 1356 129/83     Pulse Rate 12/02/19 1356 80     Resp 12/02/19  1356 16     Temp 12/02/19 1356 99 F (37.2 C)     Temp Source 12/02/19 1356 Oral     SpO2 12/02/19 1356 96 %     Weight --      Height --      Head Circumference --      Peak Flow --      Pain Score 12/02/19 1358 10     Pain Loc --      Pain Edu? --      Excl. in [place]? --    No data found.  Updated Vital Signs BP 129/83 (BP Location: Right Arm)   Pulse 80   Temp 99 F (37.2 C) (Oral)   Resp 16   SpO2 96%    Physical Exam Vitals and nursing note reviewed.    Constitutional:      General: He is not in acute distress.    Appearance: Normal appearance. He is normal weight.  Eyes:     Conjunctiva/sclera: Conjunctivae normal.  Pulmonary:     Effort: Pulmonary effort is normal.  Musculoskeletal:        General: Swelling, tenderness and signs of injury present. No deformity.     Cervical back: Normal range of motion and neck supple.  Skin:    General: Skin is warm and dry.     Findings: Erythema present.  Neurological:     General: No focal deficit present.     Mental Status: He is alert and oriented to person, place, and time.  Psychiatric:        Mood and Affect: Mood normal.        Behavior: Behavior normal.        Thought Content: Thought content normal.        Judgment: Judgment normal.      UC Treatments / Results  Labs (all labs ordered are listed, but only abnormal results are displayed) Labs Reviewed - No data to display  EKG   Radiology DG Finger Middle Left  Result Date: 12/02/2019 CLINICAL DATA:  Crush injury to long finger yesterday. EXAM: LEFT MIDDLE FINGER 2+V COMPARISON:  Left hand x-rays dated June 10, 2017. FINDINGS: There is no evidence of fracture or dislocation. There is no evidence of arthropathy or other focal bone abnormality. Unchanged small bone island in the third distal tuft. Soft tissues are unremarkable. IMPRESSION: 1. Negative. Electronically Signed   By: Titus Dubin M.D.   On: 12/02/2019 14:38    Procedures Procedures (including critical care time)  Medications Ordered in UC Medications - No data to display  Initial Impression / Assessment and Plan / UC Course  I have reviewed the triage vital signs and the nursing notes.  Pertinent labs & imaging results that were available during my care of the patient were reviewed by me and considered in my medical decision making (see chart for details).    Final Clinical Impressions(s) / UC Diagnoses   Final diagnoses:  Crushing injury of left  middle finger, initial encounter   Discharge Instructions   None    ED Prescriptions    Medication Sig Dispense Auth. Provider   doxycycline (VIBRA-TABS) 100 MG tablet Take 1 tablet (100 mg total) by mouth 2 (two) times daily. 20 tablet Robyn Haber, MD   traMADol (ULTRAM) 50 MG tablet Take 1 tablet (50 mg total) by mouth every 6 (six) hours as needed. 15 tablet Robyn Haber, MD     I have reviewed the PDMP during this  encounter.   Robyn Haber, MD 12/02/19 1443

## 2019-12-19 ENCOUNTER — Ambulatory Visit (HOSPITAL_COMMUNITY): Payer: Medicare HMO | Admitting: Licensed Clinical Social Worker

## 2019-12-19 ENCOUNTER — Emergency Department (HOSPITAL_COMMUNITY)
Admission: EM | Admit: 2019-12-19 | Discharge: 2019-12-19 | Disposition: A | Discharge status: Home / Self Care | Payer: Medicare Other | Attending: Emergency Medicine | Admitting: Emergency Medicine

## 2019-12-19 ENCOUNTER — Encounter (HOSPITAL_COMMUNITY): Payer: Self-pay | Admitting: Emergency Medicine

## 2019-12-19 DIAGNOSIS — H5712 Ocular pain, left eye: Secondary | ICD-10-CM | POA: Diagnosis present

## 2019-12-19 DIAGNOSIS — Z91040 Latex allergy status: Secondary | ICD-10-CM | POA: Insufficient documentation

## 2019-12-19 MED ORDER — TETRACAINE HCL 0.5 % OP SOLN
2.0000 [drp] | Freq: Once | OPHTHALMIC | Status: AC
  Administered 2019-12-19: 2 [drp] via OPHTHALMIC
  Filled 2019-12-19: qty 4

## 2019-12-19 MED ORDER — FLUORESCEIN SODIUM 1 MG OP STRP
1.0000 | ORAL_STRIP | Freq: Once | OPHTHALMIC | Status: AC
  Administered 2019-12-19: 1 via OPHTHALMIC
  Filled 2019-12-19: qty 1

## 2019-12-19 MED ORDER — ERYTHROMYCIN 5 MG/GM OP OINT
TOPICAL_OINTMENT | Freq: Once | OPHTHALMIC | Status: AC
  Filled 2019-12-19: qty 3.5

## 2019-12-19 MED ORDER — ERYTHROMYCIN 5 MG/GM OP OINT
TOPICAL_OINTMENT | OPHTHALMIC | 0 refills | Status: DC
Start: 2019-12-19 — End: 2020-02-15

## 2019-12-19 NOTE — ED Triage Notes (Signed)
Per pt, states he started having left eye pain last night-no injury/trauma-slight redness, no vision changes

## 2019-12-19 NOTE — Discharge Instructions (Addendum)
Please follow-up with ophthalmology (the eye doctor) whose contact information is on this page.  I prescribed you an antibiotic called erythromycin.  Please place this on your lower eyelid 3-4 times a day to help with both moisturize your eye as well as to help prevent a possible infection.   If your symptoms worsen, please do not hesitate to return to the emergency department for reevaluation.  It was a pleasure to meet you.

## 2019-12-19 NOTE — ED Provider Notes (Signed)
[place] DEPT Provider Note   CSN: [id number] Arrival date & time: 12/19/19  1019     History Chief Complaint  Patient presents with  . Eye Pain    Franklin [name] is a 39 y.o. male.  HPI Patient is a 39 year old male with a medical history as noted below.  He states that about 10 hours ago he began experiencing moderate left eye pain.  It is constant.  No modifying factors.  He "feels as if something is in there". He used lubricating eyedrops with minimal relief.  He denies any blurry vision or visual changes.  Notes mild redness to the eye.  He denies fevers, chills, ear pain, cough, sore throat, chest pain, shortness of breath.    Past Medical History:  Diagnosis Date  . Bipolar 1 disorder ([place])   . Chronic headaches   . Conversion disorder with seizures or convulsions   . 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 Depression   . Hx of electroencephalogram 12/2014   normal  . Mild cognitive impairment   . Psychogenic nonepileptic seizure   . Schizophrenic disorder ([place])   . Seizures [facility])     Patient Active Problem List   Diagnosis Date Noted  . Migraine without aura and without status migrainosus, not intractable 05/22/2018  . Vertigo 05/22/2018  . Psychogenic nonepileptic seizure 02/18/2017  . Cholelithiasis 01/24/2017  . 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 Spells of decreased attentiveness 03/16/2016  . Abnormal EKG 11/30/2015  . Elevated troponin 11/30/2015  . Bipolar affective disorder, most recent episode unspecified type, remission status unspecified 03/11/2015  . Bipolar affective disorder ([place]) 01/08/2015  . Generalized idiopathic epilepsy and epileptic syndromes, without status epilepticus, not intractable ([place]) 11/27/2014  . Seizure disorder ([place]) 10/14/2014    History reviewed. No pertinent surgical  history.     Family History  Problem Relation Age of Onset  . Heart disease Mother     Social History   Tobacco Use  . Smoking status: Never Smoker  . Smokeless tobacco: Never Used  Vaping Use  . Vaping Use: Never used  Substance Use Topics  . Alcohol use: Not Currently    Alcohol/week: 0.0 standard drinks  . Drug use: No    Home Medications Prior to Admission medications   Medication Sig Start Date End Date Taking? Authorizing Provider  doxycycline (VIBRA-TABS) 100 MG tablet Take 1 tablet (100 mg total) by mouth 2 (two) times daily. 12/02/19   Robyn Haber, MD  traMADol (ULTRAM) 50 MG tablet Take 1 tablet (50 mg total) by mouth every 6 (six) hours as needed. 12/02/19   Robyn Haber, MD  loratadine (CLARITIN) 10 MG tablet Take 1 tablet (10 mg total) by mouth daily. Patient not taking: Reported on 10/11/2019 09/18/19 12/02/19  Charlott Rakes, MD  Oxcarbazepine (TRILEPTAL) 300 MG tablet Take 1/2 tablet twice a day Patient not taking: Reported on 07/08/2019 01/26/18 07/09/19  Argentina Donovan, PA-C    Allergies    Coconut oil, Other, Codeine, Depakote [divalproex sodium], Hydroxyzine, Penicillins, Tape, Latex, and Levetiracetam  Review of Systems   Review of Systems  Constitutional: Negative for chills and fever.  HENT: Negative for congestion, ear pain, rhinorrhea, sore throat and trouble swallowing.   Eyes: Positive for pain and redness. Negative for photophobia, discharge, itching and visual disturbance.  Respiratory: Negative for shortness of breath.   Cardiovascular: Negative for chest pain.  Physical Exam Updated Vital Signs BP 117/80 (BP Location: Right Arm)   Pulse (!) 53   Temp 98.2 F (36.8 C) (Oral)   Resp 16   SpO2 100%   Physical Exam Vitals and nursing note reviewed.  Constitutional:      General: He is not in acute distress.    Appearance: Normal appearance. He is normal weight. He is not ill-appearing, toxic-appearing or diaphoretic.  HENT:      Head: Normocephalic and atraumatic.     Right Ear: External ear normal.     Left Ear: External ear normal.     Nose: Nose normal.     Mouth/Throat:     Mouth: Mucous membranes are moist.     Pharynx: Oropharynx is clear. No oropharyngeal exudate or posterior oropharyngeal erythema.  Eyes:     General: Lids are normal. Lids are everted, no foreign bodies appreciated. Vision grossly intact. No scleral icterus.       Right eye: No foreign body or discharge.        Left eye: No foreign body or discharge.     Intraocular pressure: Right eye pressure is 17 mmHg. Left eye pressure is 16 mmHg. Measurements were taken using a handheld tonometer.    Extraocular Movements: Extraocular movements intact.     Right eye: Normal extraocular motion.     Left eye: Normal extraocular motion.     Conjunctiva/sclera:     Right eye: Right conjunctiva is not injected. No chemosis, exudate or hemorrhage.    Left eye: Left conjunctiva is injected. No chemosis, exudate or hemorrhage.    Pupils: Pupils are equal, round, and reactive to light.     Comments: Visual Acuity Right Eye Distance: 20/40 Left Eye Distance: 20/40 Bilateral Distance: 20/40  Cardiovascular:     Rate and Rhythm: Normal rate.     Pulses: Normal pulses.  Pulmonary:     Effort: Pulmonary effort is normal. No respiratory distress.     Breath sounds: Normal breath sounds. No stridor. No wheezing, rhonchi or rales.  Abdominal:     General: Abdomen is flat.     Tenderness: There is no abdominal tenderness.  Musculoskeletal:        General: Normal range of motion.     Cervical back: Normal range of motion and neck supple. No tenderness.  Skin:    General: Skin is warm and dry.  Neurological:     General: No focal deficit present.     Mental Status: He is alert and oriented to person, place, and time.  Psychiatric:        Mood and Affect: Mood normal.        Behavior: Behavior normal.    ED Results / Procedures / Treatments    Labs (all labs ordered are listed, but only abnormal results are displayed) Labs Reviewed - No data to display  EKG None  Radiology No results found.  Procedures Procedures (including critical care time)  Medications Ordered in ED Medications  tetracaine (PONTOCAINE) 0.5 % ophthalmic solution 2 drop (2 drops Left Eye Given by Other 12/19/19 1438)  fluorescein ophthalmic strip 1 strip (1 strip Left Eye Given 12/19/19 1438)   ED Course  I have reviewed the triage vital signs and the nursing notes.  Pertinent labs & imaging results that were available during my care of the patient were reviewed by me and considered in my medical decision making (see chart for details).    MDM Rules/Calculators/A&P  Pt is a 38 y.o. male that present with a history, physical exam, ED Clinical Course as noted above.   Patient presents today with moderate left eye pain.  His eye was mildly injected on my exam.  Visual acuity was obtained and his eyes are 20/40 both independently as well as bilaterally.  No elevation in IOP noted bilaterally.  No increased uptake of fluorescein noted in the left eye.  We will give patient a dose of erythromycin in the emergency department.  We will discharge him on a short course of erythromycin for the left eye.  We will give him a referral to ophthalmology.  Recommended that he call them tomorrow to schedule a follow-up appointment if his symptoms have not began to improve.  He understands he can return to the emergency department with any new or worsening symptoms.  His questions were answered and he was amicable to time of discharge.  His vital signs are stable.  Patient discharged to home/self care.  Condition at discharge: Stable  Note: Portions of this report may have been transcribed using voice recognition software. Every effort was made to ensure accuracy; however, inadvertent computerized transcription errors may be present.   Final  Clinical Impression(s) / ED Diagnoses Final diagnoses:  Left eye pain   Rx / DC Orders ED Discharge Orders         Ordered    erythromycin ophthalmic ointment     Discontinue  Reprint     12/19/19 1535           Rayna Sexton, PA-C 12/19/19 1538    Veryl Speak, MD 12/19/19 [phone]

## 2019-12-20 ENCOUNTER — Emergency Department (HOSPITAL_COMMUNITY)
Admission: EM | Admit: 2019-12-20 | Discharge: 2019-12-20 | Disposition: A | Discharge status: Home / Self Care | Payer: Medicare Other | Attending: Emergency Medicine | Admitting: Emergency Medicine

## 2019-12-20 ENCOUNTER — Encounter (HOSPITAL_COMMUNITY): Payer: Self-pay

## 2019-12-20 ENCOUNTER — Other Ambulatory Visit: Payer: Self-pay

## 2019-12-20 DIAGNOSIS — R569 Unspecified convulsions: Secondary | ICD-10-CM | POA: Diagnosis not present

## 2019-12-20 DIAGNOSIS — Z5321 Procedure and treatment not carried out due to patient leaving prior to being seen by health care provider: Secondary | ICD-10-CM | POA: Insufficient documentation

## 2019-12-20 LAB — BASIC METABOLIC PANEL
Anion gap: 7 (ref 5–15)
BUN: 12 mg/dL (ref 6–20)
CO2: 27 mmol/L (ref 22–32)
Calcium: 9.6 mg/dL (ref 8.9–10.3)
Chloride: 102 mmol/L (ref 98–111)
Creatinine, Ser: 1.02 mg/dL (ref 0.61–1.24)
GFR calc Af Amer: >60 mL/min (ref >60)
GFR calc non Af Amer: >60 mL/min (ref >60)
Glucose, Bld: 80 mg/dL (ref 70–99)
Potassium: 4 mmol/L (ref 3.5–5.1)
Sodium: 136 mmol/L (ref 135–145)

## 2019-12-20 LAB — CBC
HCT: 44.3 % (ref 39.0–52.0)
Hemoglobin: 14.7 g/dL (ref 13.0–17.0)
MCH: 29.3 pg (ref 26.0–34.0)
MCHC: 33.2 g/dL (ref 30.0–36.0)
MCV: 88.2 fL (ref 80.0–100.0)
Platelets: 214 10*3/uL (ref 150–400)
RBC: 5.02 MIL/uL (ref 4.22–5.81)
RDW: 12.2 % (ref 11.5–15.5)
WBC: 6.2 10*3/uL (ref 4.0–10.5)
nRBC: 0 % (ref 0.0–0.2)

## 2019-12-20 NOTE — ED Triage Notes (Signed)
From home after seizure per wife with arms and head shaking- lasting ~4 mins. PT fell out of chair, post ictal on EMS arrival. 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 Hx of bipolar, schizophrenia. A&Ox4. Refused IV. Pt states "I'm leaving" during triage.

## 2019-12-20 NOTE — ED Notes (Signed)
Pt stated he is leaving b/c he doesn't want to wait.

## 2019-12-28 ENCOUNTER — Ambulatory Visit (HOSPITAL_COMMUNITY)
Admission: EM | Admit: 2019-12-28 | Discharge: 2019-12-28 | Disposition: A | Discharge status: Home / Self Care | Payer: Medicare Other

## 2019-12-28 ENCOUNTER — Other Ambulatory Visit: Payer: Self-pay

## 2019-12-28 ENCOUNTER — Encounter (HOSPITAL_COMMUNITY): Payer: Self-pay

## 2019-12-28 DIAGNOSIS — L02411 Cutaneous abscess of right axilla: Secondary | ICD-10-CM | POA: Diagnosis not present

## 2019-12-28 MED ORDER — LIDOCAINE-EPINEPHRINE 1 %-1:100000 IJ SOLN
INTRAMUSCULAR | Status: AC
  Filled 2019-12-28: qty 1

## 2019-12-28 MED ORDER — CEPHALEXIN 500 MG PO CAPS
500.0000 mg | ORAL_CAPSULE | Freq: Four times a day (QID) | ORAL | 0 refills | Status: AC
Start: 2019-12-28 — End: 2020-01-04

## 2019-12-28 NOTE — Discharge Instructions (Signed)
Keep dressing in place for the next 24 hours. May remove tomorrow.  Then cleanse area daily with soap and water.  Keep covered to keep protected and collect drainage.  Complete course of antibiotics.  Apply warm compresses to promote further drainage.  If symptoms worsen or do not improve in the next week to return to be seen or to follow up with your PCP.

## 2019-12-28 NOTE — ED Triage Notes (Addendum)
Pt c/o pain/swelling to right axilla for past several days. Denies drainage from area, fever, chills.  Approx 2cm area of edema with erythema noted to follicles in right axilla with yellow pustules present. Took tylenol/ibuprofen approx 1 hour PTA.

## 2019-12-29 ENCOUNTER — Encounter (HOSPITAL_COMMUNITY): Payer: Self-pay

## 2019-12-29 ENCOUNTER — Ambulatory Visit (HOSPITAL_COMMUNITY)
Admission: EM | Admit: 2019-12-29 | Discharge: 2019-12-29 | Disposition: A | Discharge status: Home / Self Care | Payer: Medicare Other | Attending: Physician Assistant | Admitting: Physician Assistant

## 2019-12-29 DIAGNOSIS — T7840XA Allergy, unspecified, initial encounter: Secondary | ICD-10-CM | POA: Diagnosis not present

## 2019-12-29 DIAGNOSIS — Z5189 Encounter for other specified aftercare: Secondary | ICD-10-CM | POA: Diagnosis not present

## 2019-12-29 DIAGNOSIS — L02411 Cutaneous abscess of right axilla: Secondary | ICD-10-CM | POA: Diagnosis not present

## 2019-12-29 MED ORDER — PREDNISONE 20 MG PO TABS
40.0000 mg | ORAL_TABLET | Freq: Once | ORAL | Status: AC
  Administered 2019-12-29: 40 mg via ORAL

## 2019-12-29 MED ORDER — PREDNISONE 20 MG PO TABS
ORAL_TABLET | ORAL | Status: AC
  Filled 2019-12-29: qty 2

## 2019-12-29 MED ORDER — PREDNISONE 10 MG PO TABS
40.0000 mg | ORAL_TABLET | Freq: Every day | ORAL | 0 refills | Status: DC
Start: 2019-12-29 — End: 2019-12-29

## 2019-12-29 MED ORDER — FAMOTIDINE 20 MG PO TABS
20.0000 mg | ORAL_TABLET | Freq: Two times a day (BID) | ORAL | 0 refills | Status: DC
Start: 2019-12-29 — End: 2022-02-15

## 2019-12-29 MED ORDER — PREDNISONE 10 MG PO TABS
40.0000 mg | ORAL_TABLET | Freq: Every day | ORAL | 0 refills | Status: AC
Start: 2019-12-29 — End: 2020-01-03

## 2019-12-29 MED ORDER — DOXYCYCLINE HYCLATE 100 MG PO CAPS
100.0000 mg | ORAL_CAPSULE | Freq: Two times a day (BID) | ORAL | 0 refills | Status: AC
Start: 2019-12-29 — End: 2020-01-05

## 2019-12-29 MED ORDER — DIPHENHYDRAMINE HCL 25 MG PO TABS
50.0000 mg | ORAL_TABLET | Freq: Every evening | ORAL | 0 refills | Status: DC | PRN
Start: 2019-12-29 — End: 2020-02-15

## 2019-12-29 NOTE — ED Provider Notes (Signed)
[place]    CSN: [id number] Arrival date & time: 12/28/19  1123      History   Chief Complaint Chief Complaint  Patient presents with   Abscess    HPI Franklin [name] is a 39 y.o. male.   [name] presents with complaints of abscess to right axilla. Has increased over the past few days. No fevers. Denies any previous similar. No history of MRSA. No drainage from the area.    ROS per HPI, negative if not otherwise mentioned.      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[place])    "raises the possibility of non-epileptic events" per Neuro MD office note 03/2015   Depression    Hx of electroencephalogram 12/2014   normal   Mild cognitive impairment    Psychogenic nonepileptic seizure    Schizophrenic disorder ([place])    Seizures ([place])     Patient Active Problem List   Diagnosis Date Noted   Migraine without aura and without status migrainosus, not intractable 05/22/2018   Vertigo 05/22/2018   Psychogenic nonepileptic seizure 02/18/2017   Cholelithiasis 01/24/2017   Conversion disorder with seizures or convulsions 03/26/2016   Spells of decreased attentiveness 03/16/2016   Abnormal EKG 11/30/2015   Elevated troponin 11/30/2015   Bipolar affective disorder, most recent episode unspecified type, remission status unspecified 03/11/2015   Bipolar affective disorder ([place]) 01/08/2015   Generalized idiopathic epilepsy and epileptic syndromes, without status epilepticus, not intractable ([place]) 11/27/2014   Seizure disorder ([place]) 10/14/2014    History reviewed. No pertinent surgical history.     Home Medications    Prior to Admission medications   Medication Sig Start Date End Date Taking? Authorizing Provider  QUEtiapine (SEROQUEL) 50 MG tablet Take 50 mg by mouth at bedtime.   Yes [provider]  cephALEXin (KEFLEX) 500 MG capsule Take 1 capsule (500 mg total) by mouth 4 (four) times daily for 7 days. 12/28/19 01/04/20  Zigmund Gottron, NP  diphenhydrAMINE (BENADRYL) 25 MG tablet Take 2 tablets (50 mg total) by mouth at bedtime as needed for up to 7 days. 12/29/19 01/05/20  Darr, Marguerita Beards, PA-C  doxycycline (VIBRAMYCIN) 100 MG capsule Take 1 capsule (100 mg total) by mouth 2 (two) times daily for 7 days. 12/29/19 01/05/20  Darr, Marguerita Beards, PA-C  erythromycin ophthalmic ointment Place a 1/2 inch ribbon of ointment into the lower eyelid. 12/19/19   Rayna Sexton, PA-C  famotidine (PEPCID) 20 MG tablet Take 1 tablet (20 mg total) by mouth 2 (two) times daily for 7 days. 12/29/19 01/05/20  Darr, Marguerita Beards, PA-C  predniSONE (DELTASONE) 10 MG tablet Take 4 tablets (40 mg total) by mouth daily for 5 days. 12/29/19 01/03/20  Darr, Marguerita Beards, PA-C  traMADol (ULTRAM) 50 MG tablet Take 1 tablet (50 mg total) by mouth every 6 (six) hours as needed. 12/02/19   Robyn Haber, MD  loratadine (CLARITIN) 10 MG tablet Take 1 tablet (10 mg total) by mouth daily. Patient not taking: Reported on 10/11/2019 09/18/19 12/02/19  Charlott Rakes, MD  Oxcarbazepine (TRILEPTAL) 300 MG tablet Take 1/2 tablet twice a day Patient not taking: Reported on 07/08/2019 01/26/18 07/09/19  Argentina Donovan, PA-C    Family History Family History  Problem Relation Age of Onset   Heart disease Mother     Social History Social  History   Tobacco Use   Smoking status: Never Smoker   Smokeless tobacco: Never Used  Vaping Use   Vaping Use: Never used  Substance Use Topics   Alcohol use: Not Currently    Alcohol/week: 0.0 standard drinks   Drug use: No     Allergies   Coconut oil, Other, Codeine, Depakote [divalproex sodium], Hydroxyzine, Penicillins, Tape, Keflex [cephalexin], Latex, and Levetiracetam   Review of Systems Review of Systems   Physical Exam Triage Vital Signs ED Triage Vitals [12/28/19 1226]  Enc  Vitals Group     BP (!) 155/131     Pulse Rate 61     Resp 18     Temp 98.5 F (36.9 C)     Temp Source Oral     SpO2 100 %     Weight      Height      Head Circumference      Peak Flow      Pain Score 10     Pain Loc      Pain Edu?      Excl. in [place]?    No data found.  Updated Vital Signs BP 109/72 (BP Location: Left Arm) Comment: re-eval   Pulse 61    Temp 98.5 F (36.9 C) (Oral)    Resp 18    SpO2 100%   Visual Acuity Right Eye Distance:   Left Eye Distance:   Bilateral Distance:    Right Eye Near:   Left Eye Near:    Bilateral Near:     Physical Exam Constitutional:      Appearance: He is well-developed.  Cardiovascular:     Rate and Rhythm: Normal rate.  Pulmonary:     Effort: Pulmonary effort is normal.  Skin:    General: Skin is warm and dry.     Comments: Approximately 1.5cm raised fluctuant and red abscess to right axilla  Neurological:     Mental Status: He is alert and oriented to person, place, and time.      UC Treatments / Results  Labs (all labs ordered are listed, but only abnormal results are displayed) Labs Reviewed - No data to display  EKG   Radiology No results found.  Procedures Incision and Drainage  Date/Time: 12/29/2019 9:01 PM Performed by: Zigmund Gottron, NP Authorized by: Zigmund Gottron, NP   Consent:    Consent obtained:  Verbal   Consent given by:  Patient   Risks discussed:  Incomplete drainage and pain   Alternatives discussed:  No treatment and referral Location:    Type:  Abscess   Size:  1.5   Location:  Upper extremity   Upper extremity location: right axilla  Pre-procedure details:    Skin preparation:  Betadine Anesthesia (see MAR for exact dosages):    Anesthesia method:  Local infiltration   Local anesthetic:  Lidocaine 1% WITH epi Procedure type:    Complexity:  Simple Procedure details:    Incision types:  Single straight   Scalpel blade:  11   Drainage:  Purulent   Drainage amount:   Copious   Wound treatment:  Wound left open   Packing materials:  None Post-procedure details:    Patient tolerance of procedure:  Tolerated well, no immediate complications   (including critical care time)  Medications Ordered in UC Medications - No data to display  Initial Impression / Assessment and Plan / UC Course  I have reviewed the triage vital signs and  the nursing notes.  Pertinent labs & imaging results that were available during my care of the patient were reviewed by me and considered in my medical decision making (see chart for details).     Right axillary I&D; follow up wound care discussed. Keflex initiated as well (penicillin intolerance- nausea/vomiting listed), no known or suspected MRSA history. Return precautions provided. Patient verbalized understanding and agreeable to plan.   Final Clinical Impressions(s) / UC Diagnoses   Final diagnoses:  Abscess of axilla, right     Discharge Instructions     Keep dressing in place for the next 24 hours. May remove tomorrow.  Then cleanse area daily with soap and water.  Keep covered to keep protected and collect drainage.  Complete course of antibiotics.  Apply warm compresses to promote further drainage.  If symptoms worsen or do not improve in the next week to return to be seen or to follow up with your PCP.     ED Prescriptions    Medication Sig Dispense Auth. Provider   cephALEXin (KEFLEX) 500 MG capsule Take 1 capsule (500 mg total) by mouth 4 (four) times daily for 7 days. 28 capsule Zigmund Gottron, NP     PDMP not reviewed this encounter.   Zigmund Gottron, NP 12/29/19 2103

## 2019-12-29 NOTE — ED Provider Notes (Signed)
[place]    CSN: [id number] Arrival date & time: 12/29/19  1657      History   Chief Complaint Chief Complaint  Patient presents with  . Allergic Reaction    HPI Franklin [name] is a 39 y.o. male.   Patient presents for reaction to Keflex.  He was started on Keflex for infection in his right axilla yesterday.  He had an abscess drained and started the Keflex last night.  He reports he woke up with itching and feeling like chest tightness.  He has not taken another dose.  He is reported consistent itching throughout the day.  Reports he is breathing fine now.  He has not taken any medicines for it.  He has never had a reaction to Keflex before but has had reactions to amoxicillin per chart review.  Patient reports the right axillary abscess is still draining some.  He reports is still painful.      Past Medical History:  Diagnosis Date  . Bipolar 1 disorder ([place])   . Chronic headaches   . Conversion disorder with seizures or convulsions   . Convulsion, non-epileptic [facility])    "raises the possibility of non-epileptic events" per Neuro MD office note 03/2015  . Depression   . Hx of electroencephalogram 12/2014   normal  . Mild cognitive impairment   . Psychogenic nonepileptic seizure   . Schizophrenic disorder ([place])   . Seizures [facility])     Patient Active Problem List   Diagnosis Date Noted  . Migraine without aura and without status migrainosus, not intractable 05/22/2018  . Vertigo 05/22/2018  . Psychogenic nonepileptic seizure 02/18/2017  . Cholelithiasis 01/24/2017  . 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 Spells of decreased attentiveness 03/16/2016  . Abnormal EKG 11/30/2015  . Elevated troponin 11/30/2015  . Bipolar affective disorder, most recent episode unspecified type, remission status unspecified 03/11/2015  . Bipolar affective disorder ([place]) 01/08/2015  . Generalized idiopathic epilepsy and epileptic syndromes, without  status epilepticus, not intractable ([place]) 11/27/2014  . Seizure disorder ([place]) 10/14/2014    History reviewed. No pertinent surgical history.     Home Medications    Prior to Admission medications   Medication Sig Start Date End Date Taking? Authorizing Provider  cephALEXin (KEFLEX) 500 MG capsule Take 1 capsule (500 mg total) by mouth 4 (four) times daily for 7 days. 12/28/19 01/04/20  Zigmund Gottron, NP  diphenhydrAMINE (BENADRYL) 25 MG tablet Take 2 tablets (50 mg total) by mouth at bedtime as needed for up to 7 days. 12/29/19 01/05/20  Isa Kohlenberg, Marguerita Beards, PA-C  doxycycline (VIBRAMYCIN) 100 MG capsule Take 1 capsule (100 mg total) by mouth 2 (two) times daily for 7 days. 12/29/19 01/05/20  Amir Fick, Marguerita Beards, PA-C  erythromycin ophthalmic ointment Place a 1/2 inch ribbon of ointment into the lower eyelid. 12/19/19   Rayna Sexton, PA-C  famotidine (PEPCID) 20 MG tablet Take 1 tablet (20 mg total) by mouth 2 (two) times daily for 7 days. 12/29/19 01/05/20  Lashelle Koy, Marguerita Beards, PA-C  predniSONE (DELTASONE) 10 MG tablet Take 4 tablets (40 mg total) by mouth daily for 5 days. 12/29/19 01/03/20  Emmalou Hunger, Marguerita Beards, PA-C  QUEtiapine (SEROQUEL) 50 MG tablet Take 50 mg by mouth at bedtime.    [provider]  traMADol (ULTRAM) 50 MG tablet Take 1 tablet (50 mg total) by mouth every 6 (six) hours as needed. 12/02/19   Robyn Haber, MD  loratadine (CLARITIN) 10 MG tablet Take  1 tablet (10 mg total) by mouth daily. Patient not taking: Reported on 10/11/2019 09/18/19 12/02/19  Charlott Rakes, MD  Oxcarbazepine (TRILEPTAL) 300 MG tablet Take 1/2 tablet twice a day Patient not taking: Reported on 07/08/2019 01/26/18 07/09/19  Argentina Donovan, PA-C    Family History Family History  Problem Relation Age of Onset  . Heart disease Mother     Social History Social History   Tobacco Use  . Smoking status: Never Smoker  . Smokeless tobacco: Never Used  Vaping Use  . Vaping Use: Never used  Substance Use Topics  .  Alcohol use: Not Currently    Alcohol/week: 0.0 standard drinks  . Drug use: No     Allergies   Coconut oil, Other, Codeine, Depakote [divalproex sodium], Hydroxyzine, Penicillins, Tape, Keflex [cephalexin], Latex, and Levetiracetam   Review of Systems Review of Systems   Physical Exam Triage Vital Signs ED Triage Vitals  Enc Vitals Group     BP 12/29/19 1742 121/66     Pulse Rate 12/29/19 1742 91     Resp 12/29/19 1742 16     Temp 12/29/19 1742 98.1 F (36.7 C)     Temp Source 12/29/19 1742 Oral     SpO2 12/29/19 1742 100 %     Weight 12/29/19 1746 155 lb (70.3 kg)     Height 12/29/19 1746 6' (1.829 m)     Head Circumference --      Peak Flow --      Pain Score 12/29/19 1745 10     Pain Loc --      Pain Edu? --      Excl. in [place]? --    No data found.  Updated Vital Signs BP 121/66 (BP Location: Left Arm)   Pulse 91   Temp 98.1 F (36.7 C) (Oral)   Resp 16   Ht 6' (1.829 m)   Wt 155 lb (70.3 kg)   SpO2 100%   BMI 21.02 kg/m   Visual Acuity Right Eye Distance:   Left Eye Distance:   Bilateral Distance:    Right Eye Near:   Left Eye Near:    Bilateral Near:     Physical Exam Vitals and nursing note reviewed.  Constitutional:      General: He is not in acute distress.    Appearance: He is well-developed. He is not ill-appearing.  HENT:     Head: Normocephalic and atraumatic.  Eyes:     Conjunctiva/sclera: Conjunctivae normal.  Cardiovascular:     Rate and Rhythm: Normal rate and regular rhythm.     Heart sounds: No murmur heard.   Pulmonary:     Effort: Pulmonary effort is normal. No respiratory distress.     Breath sounds: Normal breath sounds.  Musculoskeletal:     Cervical back: Neck supple.  Skin:    General: Skin is warm and dry.     Findings: Rash (Slightly erythematous rash around the neck and scattered on torso.  Not overly consistent with urticaria.) present.     Comments: Right axilla with well-appearing incision from abscess  drainage.  No active drainage.  Tender to palpation  Neurological:     Mental Status: He is alert.      UC Treatments / Results  Labs (all labs ordered are listed, but only abnormal results are displayed) Labs Reviewed - No data to display  EKG   Radiology No results found.  Procedures Procedures (including critical care time)  Medications Ordered in  UC Medications  predniSONE (DELTASONE) tablet 40 mg (40 mg Oral Given 12/29/19 1832)    Initial Impression / Assessment and Plan / UC Course  I have reviewed the triage vital signs and the nursing notes.  Pertinent labs & imaging results that were available during my care of the patient were reviewed by me and considered in my medical decision making (see chart for details).     #Allergic reaction drug #Wound check Patient is a 39 year old presenting with allergic reaction to Keflex.  Mild reaction, no objective wheezing however given patient's reported symptoms will place on prednisone.  Patient concerned with being able to obtain medications tonight, 40 mg dose given in clinic.  We will do 5-day course.  Will place on doxycycline and have him stop the Keflex.  Recommend Benadryl tonight.  To return if not improving.  Discussed follow-up for abscess wound.  Emergency department precautions discussed.  Patient verbalized understanding. Final Clinical Impressions(s) / UC Diagnoses   Final diagnoses:  Allergic reaction to drug, initial encounter  Wound check, abscess     Discharge Instructions     Take the prednisone as prescribed for 5 days Take benadryl tonight, 2 tablets, this will make you sleepy, do not drive or drink alcohol after taking.  Take the pepcid 2 times a day for 7 days  Take the new antibiotic doxycycline ,2 times a day for 7 days - take with plenty of water  - do not lie down within 30 minutes of taking   Return if not improving  If developing shortness of breath, feeling like your throat will  close, go to the Emergency department     ED Prescriptions    Medication Sig Dispense Auth. Provider   doxycycline (VIBRAMYCIN) 100 MG capsule Take 1 capsule (100 mg total) by mouth 2 (two) times daily for 7 days. 14 capsule Maddux Vanscyoc, Marguerita Beards, PA-C   predniSONE (DELTASONE) 10 MG tablet  (Status: Discontinued) Take 4 tablets (40 mg total) by mouth daily for 3 days. 12 tablet Sheva Mcdougle, Marguerita Beards, PA-C   diphenhydrAMINE (BENADRYL) 25 MG tablet Take 2 tablets (50 mg total) by mouth at bedtime as needed for up to 7 days. 14 tablet Ezabella Teska, Marguerita Beards, PA-C   famotidine (PEPCID) 20 MG tablet Take 1 tablet (20 mg total) by mouth 2 (two) times daily for 7 days. 14 tablet Daryan Buell, Marguerita Beards, PA-C   predniSONE (DELTASONE) 10 MG tablet Take 4 tablets (40 mg total) by mouth daily for 5 days. 20 tablet Kamon Fahr, Marguerita Beards, PA-C     PDMP not reviewed this encounter.   Purnell Shoemaker, PA-C 12/29/19 1840

## 2019-12-29 NOTE — Discharge Instructions (Addendum)
Take the prednisone as prescribed for 5 days Take benadryl tonight, 2 tablets, this will make you sleepy, do not drive or drink alcohol after taking.  Take the pepcid 2 times a day for 7 days  Take the new antibiotic doxycycline ,2 times a day for 7 days - take with plenty of water  - do not lie down within 30 minutes of taking   Return if not improving  If developing shortness of breath, feeling like your throat will close, go to the Emergency department

## 2019-12-29 NOTE — ED Triage Notes (Signed)
Pt had allergic reaction to keflex, pt itching. Pt airway intact.

## 2020-01-01 ENCOUNTER — Emergency Department (HOSPITAL_COMMUNITY)
Admission: EM | Admit: 2020-01-01 | Discharge: 2020-01-01 | Disposition: A | Discharge status: Home / Self Care | Payer: Medicare Other | Attending: Emergency Medicine | Admitting: Emergency Medicine

## 2020-01-01 ENCOUNTER — Encounter (HOSPITAL_COMMUNITY): Payer: Self-pay | Admitting: Emergency Medicine

## 2020-01-01 DIAGNOSIS — R112 Nausea with vomiting, unspecified: Secondary | ICD-10-CM | POA: Diagnosis present

## 2020-01-01 DIAGNOSIS — Z5321 Procedure and treatment not carried out due to patient leaving prior to being seen by health care provider: Secondary | ICD-10-CM | POA: Insufficient documentation

## 2020-01-01 LAB — CBC
HCT: 42.2 % (ref 39.0–52.0)
Hemoglobin: 14.1 g/dL (ref 13.0–17.0)
MCH: 29.7 pg (ref 26.0–34.0)
MCHC: 33.4 g/dL (ref 30.0–36.0)
MCV: 88.8 fL (ref 80.0–100.0)
Platelets: 187 10*3/uL (ref 150–400)
RBC: 4.75 MIL/uL (ref 4.22–5.81)
RDW: 12.2 % (ref 11.5–15.5)
WBC: 5.7 10*3/uL (ref 4.0–10.5)
nRBC: 0 % (ref 0.0–0.2)

## 2020-01-01 LAB — COMPREHENSIVE METABOLIC PANEL
ALT: 23 U/L (ref 0–44)
AST: 22 U/L (ref 15–41)
Albumin: 3.9 g/dL (ref 3.5–5.0)
Alkaline Phosphatase: 54 U/L (ref 38–126)
Anion gap: 6 (ref 5–15)
BUN: 11 mg/dL (ref 6–20)
CO2: 28 mmol/L (ref 22–32)
Calcium: 9.2 mg/dL (ref 8.9–10.3)
Chloride: 102 mmol/L (ref 98–111)
Creatinine, Ser: 0.96 mg/dL (ref 0.61–1.24)
GFR calc Af Amer: >60 mL/min (ref >60)
GFR calc non Af Amer: >60 mL/min (ref >60)
Glucose, Bld: 92 mg/dL (ref 70–99)
Potassium: 3.6 mmol/L (ref 3.5–5.1)
Sodium: 136 mmol/L (ref 135–145)
Total Bilirubin: 0.8 mg/dL (ref 0.3–1.2)
Total Protein: 7 g/dL (ref 6.5–8.1)

## 2020-01-01 LAB — LIPASE, BLOOD: Lipase: 32 U/L (ref 11–51)

## 2020-01-01 MED ORDER — SODIUM CHLORIDE 0.9% FLUSH: 3.0000 mL | Freq: Once | INTRAVENOUS | Status: DC

## 2020-01-01 NOTE — ED Triage Notes (Signed)
Pt arrives to ED from home via [facility] with c/o of N/V that started this morning. Pt also has a cough and this n/v started after taking cough syrup w/o eating.   128/74 HR 64 RR18 98% RA

## 2020-01-01 NOTE — ED Notes (Signed)
Pt notified staff that he was leaving due to wait time.

## 2020-01-02 ENCOUNTER — Emergency Department (HOSPITAL_COMMUNITY)
Admission: EM | Admit: 2020-01-02 | Discharge: 2020-01-02 | Disposition: A | Discharge status: Home / Self Care | Payer: Medicare Other | Attending: Emergency Medicine | Admitting: Emergency Medicine

## 2020-01-02 ENCOUNTER — Encounter (HOSPITAL_COMMUNITY): Payer: Self-pay | Admitting: Emergency Medicine

## 2020-01-02 ENCOUNTER — Other Ambulatory Visit: Payer: Self-pay

## 2020-01-02 ENCOUNTER — Encounter (HOSPITAL_COMMUNITY): Payer: Self-pay

## 2020-01-02 ENCOUNTER — Ambulatory Visit (HOSPITAL_COMMUNITY)
Admission: EM | Admit: 2020-01-02 | Discharge: 2020-01-02 | Disposition: A | Discharge status: Home / Self Care | Payer: Medicare Other | Attending: Family Medicine | Admitting: Family Medicine

## 2020-01-02 DIAGNOSIS — Z79899 Other long term (current) drug therapy: Secondary | ICD-10-CM | POA: Diagnosis not present

## 2020-01-02 DIAGNOSIS — Z880 Allergy status to penicillin: Secondary | ICD-10-CM | POA: Insufficient documentation

## 2020-01-02 DIAGNOSIS — J45909 Unspecified asthma, uncomplicated: Secondary | ICD-10-CM | POA: Diagnosis not present

## 2020-01-02 DIAGNOSIS — R059 Cough, unspecified: Secondary | ICD-10-CM

## 2020-01-02 DIAGNOSIS — Z885 Allergy status to narcotic agent status: Secondary | ICD-10-CM | POA: Insufficient documentation

## 2020-01-02 DIAGNOSIS — Z20822 Contact with and (suspected) exposure to covid-19: Secondary | ICD-10-CM | POA: Insufficient documentation

## 2020-01-02 DIAGNOSIS — Z881 Allergy status to other antibiotic agents status: Secondary | ICD-10-CM | POA: Diagnosis not present

## 2020-01-02 DIAGNOSIS — F209 Schizophrenia, unspecified: Secondary | ICD-10-CM | POA: Diagnosis not present

## 2020-01-02 DIAGNOSIS — Z7952 Long term (current) use of systemic steroids: Secondary | ICD-10-CM | POA: Insufficient documentation

## 2020-01-02 DIAGNOSIS — Z888 Allergy status to other drugs, medicaments and biological substances status: Secondary | ICD-10-CM | POA: Diagnosis not present

## 2020-01-02 DIAGNOSIS — G40309 Generalized idiopathic epilepsy and epileptic syndromes, not intractable, without status epilepticus: Secondary | ICD-10-CM | POA: Diagnosis not present

## 2020-01-02 DIAGNOSIS — Z5321 Procedure and treatment not carried out due to patient leaving prior to being seen by health care provider: Secondary | ICD-10-CM | POA: Insufficient documentation

## 2020-01-02 DIAGNOSIS — R05 Cough: Secondary | ICD-10-CM | POA: Insufficient documentation

## 2020-01-02 DIAGNOSIS — F319 Bipolar disorder, unspecified: Secondary | ICD-10-CM | POA: Insufficient documentation

## 2020-01-02 LAB — SARS CORONAVIRUS 2 (TAT 6-24 HRS): SARS Coronavirus 2: NEGATIVE

## 2020-01-02 MED ORDER — BENZONATATE 100 MG PO CAPS: 100.0000 mg | ORAL_CAPSULE | Freq: Three times a day (TID) | ORAL | 0 refills | Status: DC | PRN

## 2020-01-02 MED ORDER — ALBUTEROL SULFATE HFA 108 (90 BASE) MCG/ACT IN AERS: 1.0000 | INHALATION_SPRAY | Freq: Four times a day (QID) | RESPIRATORY_TRACT | 0 refills | Status: DC | PRN

## 2020-01-02 NOTE — ED Notes (Signed)
Pt stated he was leaving. 

## 2020-01-02 NOTE — ED Triage Notes (Signed)
Pt arrives via [facility] from home with c/o cough x3 days, seen here yesterday for the same, LWBS due to wait time, denies recent covid exposure or fevers. O2 98%. Cough is dry and intermittent.

## 2020-01-02 NOTE — Discharge Instructions (Signed)
Use of inhaler as needed for wheezing or shortness of breath.   Tessalon capsule as needed for cough. Likely won't stop cough completely but will hopefully help to decrease frequency and sensation of needing to cough.  Push fluids to ensure adequate hydration and keep secretions thin.  Over the counter medications as needed such as delsym which may be helpful with cough.  If symptoms worsen or do not improve in the next week to return to be seen or to follow up with your PCP.

## 2020-01-02 NOTE — ED Triage Notes (Signed)
Pt presents to UC for cough x3 days. Pt states he is having difficulty sleeping r/t cough. Pt denies sore throat, fever, runny nose, fever, difficulty breathing, loss of taste or smell. Pt denies n/v/d. Pt agreeable to covid testing at this time.

## 2020-01-02 NOTE — ED Notes (Signed)
Patient registered and admitted to [facility] ED.  Will need to wait for patient to be discharged before rooming.

## 2020-01-02 NOTE — ED Provider Notes (Signed)
[name]    CSN: [id number] Arrival date & time: 01/02/20  1142      History   Chief Complaint Chief Complaint  Patient presents with   Cough    HPI Franklin [name] is a 39 y.o. male.   Franklin [name] presents with complaints of cough. Started three days ago. Has been taking therflu, dayquil/nyquil, which haven't helped. No fevers. No shortness of breath. No sore throat, no nasal drainage. No ear pain. Vomited a few days ago. No further GI symptoms. No known ill contacts. No history of covid-19 and has not received vaccination. History of asthma. Doesn't have any inhalers. Occasionally notes wheezing. Doesn't smoke. Cough is dry. Went to the ER and had basic labs completed in triage yesterday, which were normal, LWBS.    ROS per HPI, negative if not otherwise mentioned.      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[place])    "raises the possibility of non-epileptic events" per Neuro MD office note 03/2015   Depression    Hx of electroencephalogram 12/2014   normal   Mild cognitive impairment    Psychogenic nonepileptic seizure    Schizophrenic disorder ([place])    Seizures ([place])     Patient Active Problem List   Diagnosis Date Noted   Migraine without aura and without status migrainosus, not intractable 05/22/2018   Vertigo 05/22/2018   Psychogenic nonepileptic seizure 02/18/2017   Cholelithiasis 01/24/2017   Conversion disorder with seizures or convulsions 03/26/2016   Spells of decreased attentiveness 03/16/2016   Abnormal EKG 11/30/2015   Elevated troponin 11/30/2015   Bipolar affective disorder, most recent episode unspecified type, remission status unspecified 03/11/2015   Bipolar affective disorder ([place]) 01/08/2015   Generalized idiopathic epilepsy and epileptic syndromes, without status epilepticus, not  intractable ([place]) 11/27/2014   Seizure disorder ([place]) 10/14/2014    History reviewed. No pertinent surgical history.     Home Medications    Prior to Admission medications   Medication Sig Start Date End Date Taking? Authorizing Provider  albuterol (PROAIR HFA) 108 (90 Base) MCG/ACT inhaler Inhale 1-2 puffs into the lungs every 6 (six) hours as needed for wheezing or shortness of breath. 01/02/20   Zigmund Gottron, NP  benzonatate (TESSALON) 100 MG capsule Take 1-2 capsules (100-200 mg total) by mouth 3 (three) times daily as needed for cough. 01/02/20   Zigmund Gottron, NP  cephALEXin (KEFLEX) 500 MG capsule Take 1 capsule (500 mg total) by mouth 4 (four) times daily for 7 days. 12/28/19 01/04/20  Zigmund Gottron, NP  diphenhydrAMINE (BENADRYL) 25 MG tablet Take 2 tablets (50 mg total) by mouth at bedtime as needed for up to 7 days. 12/29/19 01/05/20  Darr, Marguerita Beards, PA-C  doxycycline (VIBRAMYCIN) 100 MG capsule Take 1 capsule (100 mg total) by mouth 2 (two) times daily for 7 days. 12/29/19 01/05/20  Darr, Marguerita Beards, PA-C  erythromycin ophthalmic ointment Place a 1/2 inch ribbon of ointment into the lower eyelid. 12/19/19   Rayna Sexton, PA-C  famotidine (PEPCID) 20 MG tablet Take 1 tablet (20 mg total) by mouth 2 (two) times daily for 7 days. 12/29/19 01/05/20  Darr, Marguerita Beards, PA-C  predniSONE (DELTASONE) 10 MG tablet Take 4 tablets (40 mg total) by mouth daily for 5 days. 12/29/19 01/03/20  Darr, Marguerita Beards, PA-C  QUEtiapine (SEROQUEL) 50  MG tablet Take 50 mg by mouth at bedtime.    [provider]  traMADol (ULTRAM) 50 MG tablet Take 1 tablet (50 mg total) by mouth every 6 (six) hours as needed. 12/02/19   Robyn Haber, MD  loratadine (CLARITIN) 10 MG tablet Take 1 tablet (10 mg total) by mouth daily. Patient not taking: Reported on 10/11/2019 09/18/19 12/02/19  Charlott Rakes, MD  Oxcarbazepine (TRILEPTAL) 300 MG tablet Take 1/2 tablet twice a day Patient not taking: Reported on 07/08/2019 01/26/18  07/09/19  Argentina Donovan, PA-C    Family History Family History  Problem Relation Age of Onset   Heart disease Mother     Social History Social History   Tobacco Use   Smoking status: Never Smoker   Smokeless tobacco: Never Used  Vaping Use   Vaping Use: Never used  Substance Use Topics   Alcohol use: Not Currently    Alcohol/week: 0.0 standard drinks   Drug use: No     Allergies   Coconut oil, Other, Codeine, Depakote [divalproex sodium], Hydroxyzine, Penicillins, Tape, Keflex [cephalexin], Latex, and Levetiracetam   Review of Systems Review of Systems   Physical Exam Triage Vital Signs ED Triage Vitals  Enc Vitals Group     BP 01/02/20 1317 125/73     Pulse Rate 01/02/20 1317 71     Resp 01/02/20 1317 16     Temp 01/02/20 1317 98.5 F (36.9 C)     Temp Source 01/02/20 1317 Oral     SpO2 01/02/20 1317 100 %     Weight --      Height --      Head Circumference --      Peak Flow --      Pain Score 01/02/20 1320 0     Pain Loc --      Pain Edu? --      Excl. in [place]? --    No data found.  Updated Vital Signs BP 125/73 (BP Location: Left Arm)    Pulse 71    Temp 98.5 F (36.9 C) (Oral)    Resp 16    SpO2 100%    Physical Exam Constitutional:      Appearance: He is well-developed.  Cardiovascular:     Rate and Rhythm: Normal rate and regular rhythm.  Pulmonary:     Effort: Pulmonary effort is normal.     Breath sounds: Normal breath sounds.  Skin:    General: Skin is warm and dry.  Neurological:     Mental Status: He is alert and oriented to person, place, and time.      UC Treatments / Results  Labs (all labs ordered are listed, but only abnormal results are displayed) Labs Reviewed  SARS CORONAVIRUS 2 (TAT 6-24 HRS)    EKG   Radiology No results found.  Procedures Procedures (including critical care time)  Medications Ordered in UC Medications - No data to display  Initial Impression / Assessment and Plan / UC Course  I  have reviewed the triage vital signs and the nursing notes.  Pertinent labs & imaging results that were available during my care of the patient were reviewed by me and considered in my medical decision making (see chart for details).     Non toxic. Benign physical exam.  No increased work of breathing. Speaking clearly without difficulty. ER labs reviewed and WNL. Supportive cares recommended. Return precautions provided. Patient verbalized understanding and agreeable to plan.   Final Clinical Impressions(s) /  UC Diagnoses   Final diagnoses:  Cough     Discharge Instructions     Use of inhaler as needed for wheezing or shortness of breath.   Tessalon capsule as needed for cough. Likely won't stop cough completely but will hopefully help to decrease frequency and sensation of needing to cough.  Push fluids to ensure adequate hydration and keep secretions thin.  Over the counter medications as needed such as delsym which may be helpful with cough.  If symptoms worsen or do not improve in the next week to return to be seen or to follow up with your PCP.      ED Prescriptions    Medication Sig Dispense Auth. Provider   benzonatate (TESSALON) 100 MG capsule Take 1-2 capsules (100-200 mg total) by mouth 3 (three) times daily as needed for cough. 21 capsule Augusto Gamble B, NP   albuterol (PROAIR HFA) 108 (90 Base) MCG/ACT inhaler Inhale 1-2 puffs into the lungs every 6 (six) hours as needed for wheezing or shortness of breath. 8 g Zigmund Gottron, NP     PDMP not reviewed this encounter.   Zigmund Gottron, NP 01/02/20 1527

## 2020-01-15 ENCOUNTER — Encounter (HOSPITAL_COMMUNITY): Payer: Self-pay

## 2020-01-15 ENCOUNTER — Other Ambulatory Visit: Payer: Self-pay

## 2020-01-15 ENCOUNTER — Emergency Department (HOSPITAL_COMMUNITY)
Admission: EM | Admit: 2020-01-15 | Discharge: 2020-01-15 | Disposition: A | Discharge status: Home / Self Care | Payer: Medicare Other | Attending: Emergency Medicine | Admitting: Emergency Medicine

## 2020-01-15 DIAGNOSIS — Z91040 Latex allergy status: Secondary | ICD-10-CM | POA: Diagnosis not present

## 2020-01-15 DIAGNOSIS — R569 Unspecified convulsions: Secondary | ICD-10-CM | POA: Insufficient documentation

## 2020-01-15 DIAGNOSIS — Z79899 Other long term (current) drug therapy: Secondary | ICD-10-CM | POA: Diagnosis not present

## 2020-01-15 LAB — CBC WITH DIFFERENTIAL/PLATELET
Abs Immature Granulocytes: 0.01 10*3/uL (ref 0.00–0.07)
Basophils Absolute: 0 10*3/uL (ref 0.0–0.1)
Basophils Relative: 1 %
Eosinophils Absolute: 0 10*3/uL (ref 0.0–0.5)
Eosinophils Relative: 1 %
HCT: 45.1 % (ref 39.0–52.0)
Hemoglobin: 15.5 g/dL (ref 13.0–17.0)
Immature Granulocytes: 0 %
Lymphocytes Relative: 28 %
Lymphs Abs: 1.3 10*3/uL (ref 0.7–4.0)
MCH: 30.2 pg (ref 26.0–34.0)
MCHC: 34.4 g/dL (ref 30.0–36.0)
MCV: 87.9 fL (ref 80.0–100.0)
Monocytes Absolute: 0.4 10*3/uL (ref 0.1–1.0)
Monocytes Relative: 9 %
Neutro Abs: 2.9 10*3/uL (ref 1.7–7.7)
Neutrophils Relative %: 61 %
Platelets: 193 10*3/uL (ref 150–400)
RBC: 5.13 MIL/uL (ref 4.22–5.81)
RDW: 12.3 % (ref 11.5–15.5)
WBC: 4.7 10*3/uL (ref 4.0–10.5)
nRBC: 0 % (ref 0.0–0.2)

## 2020-01-15 LAB — BASIC METABOLIC PANEL
Anion gap: 10 (ref 5–15)
BUN: 16 mg/dL (ref 6–20)
CO2: 26 mmol/L (ref 22–32)
Calcium: 9.7 mg/dL (ref 8.9–10.3)
Chloride: 103 mmol/L (ref 98–111)
Creatinine, Ser: 0.92 mg/dL (ref 0.61–1.24)
GFR calc Af Amer: >60 mL/min (ref >60)
GFR calc non Af Amer: >60 mL/min (ref >60)
Glucose, Bld: 92 mg/dL (ref 70–99)
Potassium: 3.9 mmol/L (ref 3.5–5.1)
Sodium: 139 mmol/L (ref 135–145)

## 2020-01-15 NOTE — ED Provider Notes (Signed)
[place] DEPT Provider Note   CSN: [id number] Arrival date & time: 01/15/20  5784     History Chief Complaint  Patient presents with  . Seizures    Franklin [name] is a 39 y.o. male.  HPI   39 year old male with seizure-like activity.  Happened before arrival.  Patient states that he was arguing with his wife shortly before symptoms began.  He reports that his neurologist took him off his prior seizure medications but he is not sure why.  Prior to today, last reported seizure was about 2 weeks ago.  Up until this event he has been in his usual state of health otherwise.  No fevers.    Past Medical History:  Diagnosis Date  . Bipolar 1 disorder ([place])   . Chronic headaches   . Conversion disorder with seizures or convulsions   . Convulsion, non-epileptic [facility])    "raises the possibility of non-epileptic events" per Neuro MD office note 03/2015  . Depression   . Hx of electroencephalogram 12/2014   normal  . Mild cognitive impairment   . Psychogenic nonepileptic seizure   . Schizophrenic disorder ([place])   . Seizures [facility])     Patient Active Problem List   Diagnosis Date Noted  . Migraine without aura and without status migrainosus, not intractable 05/22/2018  . Vertigo 05/22/2018  . Psychogenic nonepileptic seizure 02/18/2017  . Cholelithiasis 01/24/2017  . 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 Spells of decreased attentiveness 03/16/2016  . Abnormal EKG 11/30/2015  . Elevated troponin 11/30/2015  . Bipolar affective disorder, most recent episode unspecified type, remission status unspecified 03/11/2015  . Bipolar affective disorder ([place]) 01/08/2015  . Generalized idiopathic epilepsy and epileptic syndromes, without status epilepticus, not intractable ([place]) 11/27/2014  . Seizure disorder ([place]) 10/14/2014    History reviewed. No pertinent surgical history.     Family History  Problem Relation Age of Onset  .  Heart disease Mother     Social History   Tobacco Use  . Smoking status: Never Smoker  . Smokeless tobacco: Never Used  Vaping Use  . Vaping Use: Never used  Substance Use Topics  . Alcohol use: Not Currently    Alcohol/week: 0.0 standard drinks  . Drug use: No    Home Medications Prior to Admission medications   Medication Sig Start Date End Date Taking? Authorizing Provider  albuterol (PROAIR HFA) 108 (90 Base) MCG/ACT inhaler Inhale 1-2 puffs into the lungs every 6 (six) hours as needed for wheezing or shortness of breath. 01/02/20   Zigmund Gottron, NP  benzonatate (TESSALON) 100 MG capsule Take 1-2 capsules (100-200 mg total) by mouth 3 (three) times daily as needed for cough. 01/02/20   Zigmund Gottron, NP  diphenhydrAMINE (BENADRYL) 25 MG tablet Take 2 tablets (50 mg total) by mouth at bedtime as needed for up to 7 days. 12/29/19 01/05/20  Darr, Marguerita Beards, PA-C  erythromycin ophthalmic ointment Place a 1/2 inch ribbon of ointment into the lower eyelid. 12/19/19   Rayna Sexton, PA-C  famotidine (PEPCID) 20 MG tablet Take 1 tablet (20 mg total) by mouth 2 (two) times daily for 7 days. 12/29/19 01/05/20  Darr, Marguerita Beards, PA-C  QUEtiapine (SEROQUEL) 50 MG tablet Take 50 mg by mouth at bedtime.    [provider]  traMADol (ULTRAM) 50 MG tablet Take 1 tablet (50 mg total) by mouth every 6 (six) hours as needed. 12/02/19   Robyn Haber, MD  loratadine [facility])  10 MG tablet Take 1 tablet (10 mg total) by mouth daily. Patient not taking: Reported on 10/11/2019 09/18/19 12/02/19  Charlott Rakes, MD  Oxcarbazepine (TRILEPTAL) 300 MG tablet Take 1/2 tablet twice a day Patient not taking: Reported on 07/08/2019 01/26/18 07/09/19  Argentina Donovan, PA-C    Allergies    Coconut oil, Other, Codeine, Depakote [divalproex sodium], Hydroxyzine, Penicillins, Tape, Keflex [cephalexin], Latex, and Levetiracetam  Review of Systems   Review of Systems All systems reviewed and negative, other than  as noted in HPI. Physical Exam Updated Vital Signs BP 133/80 (BP Location: Right Arm)   Pulse (!) 55   Temp 98.6 F (37 C) (Oral)   Resp 14   SpO2 100%   Physical Exam Vitals and nursing note reviewed.  Constitutional:      General: He is not in acute distress.    Appearance: He is well-developed.  HENT:     Head: Normocephalic and atraumatic.  Eyes:     General:        Right eye: No discharge.        Left eye: No discharge.     Conjunctiva/sclera: Conjunctivae normal.  Cardiovascular:     Rate and Rhythm: Normal rate and regular rhythm.     Heart sounds: Normal heart sounds. No murmur heard.  No friction rub. No gallop.   Pulmonary:     Effort: Pulmonary effort is normal. No respiratory distress.     Breath sounds: Normal breath sounds.  Abdominal:     General: There is no distension.     Palpations: Abdomen is soft.     Tenderness: There is no abdominal tenderness.  Musculoskeletal:        General: No tenderness.     Cervical back: Neck supple.  Skin:    General: Skin is warm and dry.  Neurological:     General: No focal deficit present.     Mental Status: He is alert and oriented to person, place, and time.     Cranial Nerves: No cranial nerve deficit.     Sensory: No sensory deficit.     Motor: No weakness.     Coordination: Coordination normal.  Psychiatric:        Behavior: Behavior normal.        Thought Content: Thought content normal.     ED Results / Procedures / Treatments   Labs (all labs ordered are listed, but only abnormal results are displayed) Labs Reviewed  BASIC METABOLIC PANEL  CBC WITH DIFFERENTIAL/PLATELET    EKG EKG Interpretation  Date/Time:  Tuesday January 15 2020 11:15:14 EDT Ventricular Rate:  52 PR Interval:  142 QRS Duration: 104 QT Interval:  416 QTC Calculation: 386 R Axis:   -25 Text Interpretation: Sinus bradycardia ST elevation, consider early repolarization, pericarditis, or injury Abnormal ECG Confirmed by Virgel Manifold [phone]) on 01/15/2020 11:19:43 AM   Radiology No results found.  Procedures Procedures (including critical care time)  Medications Ordered in ED Medications - No data to display  ED Course  I have reviewed the triage vital signs and the nursing notes.  Pertinent labs & imaging results that were available during my care of the patient were reviewed by me and considered in my medical decision making (see chart for details).    MDM Rules/Calculators/A&P                          39 year old male with seizure-like activity.  Suspect that this may be pseudoseizure/nonepileptiform seizure.  History of the same.  Arguing with his wife prior to symptoms beginning.  Regardless, he is back to his baseline.  Exam is reassuring.  Basic labs unremarkable.  He has established urology care.  Return precautions discussed.  Outpatient follow-up otherwise.  Final Clinical Impression(s) / ED Diagnoses Final diagnoses:  Seizure-like activity [facility])    Rx / [place] Orders ED Discharge Orders    None       Virgel Manifold, MD 01/20/20 [phone]

## 2020-01-15 NOTE — ED Triage Notes (Addendum)
Pt arrived via EMS, per EMS pt was found on floor at home seizing. Hx of seizures.Aox4. States neuro took him off his seizure meds months ago. Had a seizure x2 wks ago.   BP 123/86 HR 57 RR 20 96% RA CBG 84

## 2020-01-15 NOTE — ED Notes (Signed)
Pt discharged from this ED in stable condition at this time. All discharge instructions and follow up care reviewed with pt with no further questions at this time. Pt ambulatory with steady gait, clear speech.  

## 2020-01-16 ENCOUNTER — Ambulatory Visit (HOSPITAL_COMMUNITY): Payer: Medicare Other | Admitting: Psychiatry

## 2020-01-16 ENCOUNTER — Telehealth: Payer: Self-pay | Admitting: Neurology

## 2020-01-16 ENCOUNTER — Encounter (HOSPITAL_COMMUNITY): Payer: Self-pay | Admitting: Emergency Medicine

## 2020-01-16 ENCOUNTER — Emergency Department (HOSPITAL_COMMUNITY)
Admission: EM | Admit: 2020-01-16 | Discharge: 2020-01-16 | Disposition: A | Discharge status: Home / Self Care | Payer: Medicare Other | Attending: Emergency Medicine | Admitting: Emergency Medicine

## 2020-01-16 ENCOUNTER — Other Ambulatory Visit: Payer: Self-pay

## 2020-01-16 DIAGNOSIS — Z5321 Procedure and treatment not carried out due to patient leaving prior to being seen by health care provider: Secondary | ICD-10-CM | POA: Insufficient documentation

## 2020-01-16 DIAGNOSIS — R569 Unspecified convulsions: Secondary | ICD-10-CM | POA: Diagnosis present

## 2020-01-16 LAB — CBG MONITORING, ED: Glucose-Capillary: 102 mg/dL — ABNORMAL HIGH (ref 70–99)

## 2020-01-16 NOTE — ED Triage Notes (Signed)
Arrives via EMS from home, C/C pseudoseizure, no incontinence, patient is alert and oriented x4, when EMS tried to assess him he would not allow them to move his arms, EMS report no true postictal states. CBG 101.

## 2020-01-16 NOTE — ED Notes (Signed)
No answer from lobby  

## 2020-01-17 ENCOUNTER — Encounter (HOSPITAL_COMMUNITY): Payer: Self-pay | Admitting: Emergency Medicine

## 2020-01-17 ENCOUNTER — Emergency Department (HOSPITAL_COMMUNITY)
Admission: EM | Admit: 2020-01-17 | Discharge: 2020-01-17 | Disposition: A | Discharge status: Home / Self Care | Payer: Medicare Other | Attending: Emergency Medicine | Admitting: Emergency Medicine

## 2020-01-17 ENCOUNTER — Other Ambulatory Visit: Payer: Self-pay

## 2020-01-17 DIAGNOSIS — Z5321 Procedure and treatment not carried out due to patient leaving prior to being seen by health care provider: Secondary | ICD-10-CM | POA: Insufficient documentation

## 2020-01-17 DIAGNOSIS — R569 Unspecified convulsions: Secondary | ICD-10-CM | POA: Diagnosis not present

## 2020-01-17 NOTE — ED Triage Notes (Signed)
Per EMS, patient from home, self reporting seizure activity today lasting 40-45 minutes. Hx psuedoseizure. No incontinence noted. Patient reports he has appointment with neurology tomorrow.

## 2020-01-18 ENCOUNTER — Telehealth: Payer: Self-pay | Admitting: Neurology

## 2020-01-18 NOTE — Telephone Encounter (Signed)
Patient called and wants Dr. Delice Lesch to know he's had increased seizure activity recently resulting in visits to the ED. He'd like an appointment as soon as possible with the doctor.   Routing to clinical staff for review and advice on scheduling.

## 2020-01-18 NOTE — Telephone Encounter (Signed)
Pls see prior records, he has been discharged/dismissed from our practice. He has a neurologist Dr. Macario Carls, pls have him follow-up with her. Thanks

## 2020-01-28 ENCOUNTER — Ambulatory Visit: Payer: Medicare HMO | Admitting: Family Medicine

## 2020-02-06 ENCOUNTER — Other Ambulatory Visit: Payer: Self-pay

## 2020-02-06 ENCOUNTER — Emergency Department (HOSPITAL_COMMUNITY)
Admission: EM | Admit: 2020-02-06 | Discharge: 2020-02-06 | Disposition: A | Discharge status: Home / Self Care | Payer: Medicare Other | Attending: Emergency Medicine | Admitting: Emergency Medicine

## 2020-02-06 ENCOUNTER — Encounter (HOSPITAL_COMMUNITY): Payer: Self-pay

## 2020-02-06 DIAGNOSIS — R042 Hemoptysis: Secondary | ICD-10-CM | POA: Insufficient documentation

## 2020-02-06 DIAGNOSIS — Z5321 Procedure and treatment not carried out due to patient leaving prior to being seen by health care provider: Secondary | ICD-10-CM | POA: Diagnosis not present

## 2020-02-06 LAB — COMPREHENSIVE METABOLIC PANEL
ALT: 16 U/L (ref 0–44)
AST: 17 U/L (ref 15–41)
Albumin: 4.3 g/dL (ref 3.5–5.0)
Alkaline Phosphatase: 48 U/L (ref 38–126)
Anion gap: 9 (ref 5–15)
BUN: 11 mg/dL (ref 6–20)
CO2: 26 mmol/L (ref 22–32)
Calcium: 9.5 mg/dL (ref 8.9–10.3)
Chloride: 102 mmol/L (ref 98–111)
Creatinine, Ser: 0.96 mg/dL (ref 0.61–1.24)
GFR calc Af Amer: >60 mL/min (ref >60)
GFR calc non Af Amer: >60 mL/min (ref >60)
Glucose, Bld: 80 mg/dL (ref 70–99)
Potassium: 4.3 mmol/L (ref 3.5–5.1)
Sodium: 137 mmol/L (ref 135–145)
Total Bilirubin: 1.1 mg/dL (ref 0.3–1.2)
Total Protein: 7.2 g/dL (ref 6.5–8.1)

## 2020-02-06 LAB — TYPE AND SCREEN
ABO/RH(D): A POS
Antibody Screen: NEGATIVE

## 2020-02-06 LAB — CBC
HCT: 44.2 % (ref 39.0–52.0)
Hemoglobin: 14.7 g/dL (ref 13.0–17.0)
MCH: 30.4 pg (ref 26.0–34.0)
MCHC: 33.3 g/dL (ref 30.0–36.0)
MCV: 91.3 fL (ref 80.0–100.0)
Platelets: 204 10*3/uL (ref 150–400)
RBC: 4.84 MIL/uL (ref 4.22–5.81)
RDW: 12.4 % (ref 11.5–15.5)
WBC: 4.8 10*3/uL (ref 4.0–10.5)
nRBC: 0 % (ref 0.0–0.2)

## 2020-02-06 NOTE — ED Notes (Signed)
Called 3x for room with no response.

## 2020-02-06 NOTE — ED Notes (Signed)
Called for vitals and no response 

## 2020-02-06 NOTE — ED Triage Notes (Signed)
Patient arrived by [facility] complaining of vomiting blood x 2 this am. Denies abdominal pain. Alert and and oriented, NAD

## 2020-02-15 ENCOUNTER — Encounter (HOSPITAL_COMMUNITY): Payer: Self-pay | Admitting: Family Medicine

## 2020-02-15 ENCOUNTER — Other Ambulatory Visit: Payer: Self-pay

## 2020-02-15 ENCOUNTER — Emergency Department (HOSPITAL_COMMUNITY)
Admission: EM | Admit: 2020-02-15 | Discharge: 2020-02-15 | Disposition: A | Discharge status: Home / Self Care | Payer: Medicare Other | Attending: Emergency Medicine | Admitting: Emergency Medicine

## 2020-02-15 ENCOUNTER — Encounter (HOSPITAL_COMMUNITY): Payer: Self-pay | Admitting: Emergency Medicine

## 2020-02-15 ENCOUNTER — Ambulatory Visit (HOSPITAL_COMMUNITY)
Admission: EM | Admit: 2020-02-15 | Discharge: 2020-02-15 | Disposition: A | Discharge status: Home / Self Care | Payer: Medicare Other | Attending: Family Medicine | Admitting: Family Medicine

## 2020-02-15 DIAGNOSIS — M549 Dorsalgia, unspecified: Secondary | ICD-10-CM | POA: Diagnosis not present

## 2020-02-15 DIAGNOSIS — W19XXXA Unspecified fall, initial encounter: Secondary | ICD-10-CM | POA: Insufficient documentation

## 2020-02-15 DIAGNOSIS — R42 Dizziness and giddiness: Secondary | ICD-10-CM | POA: Diagnosis not present

## 2020-02-15 DIAGNOSIS — M545 Low back pain, unspecified: Secondary | ICD-10-CM

## 2020-02-15 DIAGNOSIS — Z5321 Procedure and treatment not carried out due to patient leaving prior to being seen by health care provider: Secondary | ICD-10-CM | POA: Diagnosis not present

## 2020-02-15 LAB — CBC
HCT: 45.4 % (ref 39.0–52.0)
Hemoglobin: 15 g/dL (ref 13.0–17.0)
MCH: 30.2 pg (ref 26.0–34.0)
MCHC: 33 g/dL (ref 30.0–36.0)
MCV: 91.5 fL (ref 80.0–100.0)
Platelets: 192 10*3/uL (ref 150–400)
RBC: 4.96 MIL/uL (ref 4.22–5.81)
RDW: 12.5 % (ref 11.5–15.5)
WBC: 4.4 10*3/uL (ref 4.0–10.5)
nRBC: 0 % (ref 0.0–0.2)

## 2020-02-15 LAB — BASIC METABOLIC PANEL
Anion gap: 4 — ABNORMAL LOW (ref 5–15)
BUN: 12 mg/dL (ref 6–20)
CO2: 27 mmol/L (ref 22–32)
Calcium: 9.6 mg/dL (ref 8.9–10.3)
Chloride: 105 mmol/L (ref 98–111)
Creatinine, Ser: 1.01 mg/dL (ref 0.61–1.24)
GFR calc Af Amer: >60 mL/min (ref >60)
GFR calc non Af Amer: >60 mL/min (ref >60)
Glucose, Bld: 86 mg/dL (ref 70–99)
Potassium: 4.6 mmol/L (ref 3.5–5.1)
Sodium: 136 mmol/L (ref 135–145)

## 2020-02-15 LAB — CBG MONITORING, ED: Glucose-Capillary: 85 mg/dL (ref 70–99)

## 2020-02-15 NOTE — ED Notes (Signed)
Pt to desk in lobby stating he is going to the urgent care and does not want to wait here any longer

## 2020-02-15 NOTE — ED Triage Notes (Signed[place] and fell and now having back pain.

## 2020-02-15 NOTE — Discharge Instructions (Addendum)
You can take ibuprofen for the pain Make sure you are drinking plenty of water.  Follow up as needed for continued or worsening symptoms Blood sugar was normal

## 2020-02-15 NOTE — ED Triage Notes (Signed)
Pt c/o 7/10 sharp lower back pain started today. Pt denies injury. Pt denies numbness and tingling. Pt states started feeling dizzy at 0545 today. Pt denies any other sx. Pt walked well to exam room.

## 2020-02-18 NOTE — ED Provider Notes (Signed)
[place]    CSN: [id number] Arrival date & time: 02/15/20  0805      History   Chief Complaint Chief Complaint  Patient presents with  . multiple complaints    HPI Franklin [name] is a 39 y.o. male.   Patient is a 39 year old male with past medical history of bipolar, chronic headaches, conversion disorder, depression, schizophrenic disorder, psychogenic seizure.  He presents today with 7-10 lower back pain that started today.  Denies any injuries.  Denies any radiation pain, numbness or tingling.  Felt mildly dizzy at work this morning.  Denies any current dizziness, chest pain or shortness of breath.  Denies any headache.  No dysuria, hematuria or urinary frequency.     Past Medical History:  Diagnosis Date  . Bipolar 1 disorder ([place])   . Chronic headaches   . Conversion disorder with seizures or convulsions   . Convulsion, non-epileptic [facility])    "raises the possibility of non-epileptic events" per Neuro MD office note 03/2015  . Depression   . Hx of electroencephalogram 12/2014   normal  . Mild cognitive impairment   . Psychogenic nonepileptic seizure   . Schizophrenic disorder ([place])   . Seizures [facility])     Patient Active Problem List   Diagnosis Date Noted  . Migraine without aura and without status migrainosus, not intractable 05/22/2018  . Vertigo 05/22/2018  . Psychogenic nonepileptic seizure 02/18/2017  . Cholelithiasis 01/24/2017  . 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 Spells of decreased attentiveness 03/16/2016  . Abnormal EKG 11/30/2015  . Elevated troponin 11/30/2015  . Bipolar affective disorder, most recent episode unspecified type, remission status unspecified 03/11/2015  . Bipolar affective disorder ([place]) 01/08/2015  . Generalized idiopathic epilepsy and epileptic syndromes, without status epilepticus, not intractable ([place]) 11/27/2014  . Seizure disorder ([place]) 10/14/2014    History reviewed. No pertinent  surgical history.     Home Medications    Prior to Admission medications   Medication Sig Start Date End Date Taking? Authorizing Provider  albuterol (PROAIR HFA) 108 (90 Base) MCG/ACT inhaler Inhale 1-2 puffs into the lungs every 6 (six) hours as needed for wheezing or shortness of breath. 01/02/20   Zigmund Gottron, NP  famotidine (PEPCID) 20 MG tablet Take 1 tablet (20 mg total) by mouth 2 (two) times daily for 7 days. 12/29/19 01/05/20  Darr, Marguerita Beards, PA-C  QUEtiapine (SEROQUEL) 50 MG tablet Take 50 mg by mouth at bedtime.    [provider]  traMADol (ULTRAM) 50 MG tablet Take 1 tablet (50 mg total) by mouth every 6 (six) hours as needed. 12/02/19   Robyn Haber, MD  diphenhydrAMINE (BENADRYL) 25 MG tablet Take 2 tablets (50 mg total) by mouth at bedtime as needed for up to 7 days. 12/29/19 02/15/20  Darr, Marguerita Beards, PA-C  loratadine (CLARITIN) 10 MG tablet Take 1 tablet (10 mg total) by mouth daily. Patient not taking: Reported on 10/11/2019 09/18/19 12/02/19  Charlott Rakes, MD  Oxcarbazepine (TRILEPTAL) 300 MG tablet Take 1/2 tablet twice a day Patient not taking: Reported on 07/08/2019 01/26/18 07/09/19  Argentina Donovan, PA-C    Family History Family History  Problem Relation Age of Onset  . Heart disease Mother     Social History Social History   Tobacco Use  . Smoking status: Never Smoker  . Smokeless tobacco: Never Used  Vaping Use  . Vaping Use: Never used  Substance Use Topics  . Alcohol use: Not Currently  Alcohol/week: 0.0 standard drinks  . Drug use: No     Allergies   Coconut oil, Other, Codeine, Depakote [divalproex sodium], Hydroxyzine, Penicillins, Tape, Keflex [cephalexin], Latex, and Levetiracetam   Review of Systems Review of Systems   Physical Exam Triage Vital Signs ED Triage Vitals  Enc Vitals Group     BP 02/15/20 0823 117/79     Pulse Rate 02/15/20 0823 (!) 49     Resp 02/15/20 0823 16     Temp 02/15/20 0823 98 F (36.7 C)     Temp  Source 02/15/20 0823 Oral     SpO2 02/15/20 0823 100 %     Weight 02/15/20 0823 155 lb (70.3 kg)     Height 02/15/20 0823 6' (1.829 m)     Head Circumference --      Peak Flow --      Pain Score 02/15/20 0822 7     Pain Loc --      Pain Edu? --      Excl. in [place]? --    No data found.  Updated Vital Signs BP 117/79   Pulse (!) 49   Temp 98 F (36.7 C) (Oral)   Resp 16   Ht 6' (1.829 m)   Wt 155 lb (70.3 kg)   SpO2 100%   BMI 21.02 kg/m   Visual Acuity Right Eye Distance:   Left Eye Distance:   Bilateral Distance:    Right Eye Near:   Left Eye Near:    Bilateral Near:     Physical Exam Vitals and nursing note reviewed.  Constitutional:      Appearance: Normal appearance.  HENT:     Head: Normocephalic and atraumatic.     Nose: Nose normal.  Eyes:     Conjunctiva/sclera: Conjunctivae normal.  Pulmonary:     Effort: Pulmonary effort is normal.  Musculoskeletal:        General: Normal range of motion.     Cervical back: Normal range of motion.     Lumbar back: Tenderness present. Negative right straight leg raise test and negative left straight leg raise test.       Back:  Skin:    General: Skin is warm and dry.  Neurological:     Mental Status: He is alert.  Psychiatric:        Mood and Affect: Mood normal.      UC Treatments / Results  Labs (all labs ordered are listed, but only abnormal results are displayed) Labs Reviewed  CBG MONITORING, ED    EKG   Radiology No results found.  Procedures Procedures (including critical care time)  Medications Ordered in UC Medications - No data to display  Initial Impression / Assessment and Plan / UC Course  I have reviewed the triage vital signs and the nursing notes.  Pertinent labs & imaging results that were available during my care of the patient were reviewed by me and considered in my medical decision making (see chart for details).     Low back pain without sciatica Most likely muscle  strain. Ibuprofen for pain as needed. Recommend increase water intake. VSS Blood sugar today was normal. Believe the dizziness may be from mild dehydration. Follow up as needed for continued or worsening symptoms  Final Clinical Impressions(s) / UC Diagnoses   Final diagnoses:  Acute low back pain without sciatica, unspecified back pain laterality  Dizziness     Discharge Instructions     You can take ibuprofen  for the pain Make sure you are drinking plenty of water.  Follow up as needed for continued or worsening symptoms Blood sugar was normal    ED Prescriptions    None     PDMP not reviewed this encounter.   Loura Halt A, NP 02/18/20 1230

## 2020-03-02 ENCOUNTER — Encounter (HOSPITAL_COMMUNITY): Payer: Self-pay | Admitting: *Deleted

## 2020-03-02 ENCOUNTER — Other Ambulatory Visit: Payer: Self-pay

## 2020-03-02 ENCOUNTER — Ambulatory Visit (HOSPITAL_COMMUNITY)
Admission: EM | Admit: 2020-03-02 | Discharge: 2020-03-02 | Disposition: A | Discharge status: Home / Self Care | Payer: Medicare Other | Attending: Family Medicine | Admitting: Family Medicine

## 2020-03-02 DIAGNOSIS — L0291 Cutaneous abscess, unspecified: Secondary | ICD-10-CM

## 2020-03-02 MED ORDER — DOXYCYCLINE HYCLATE 100 MG PO CAPS
100.0000 mg | ORAL_CAPSULE | Freq: Two times a day (BID) | ORAL | 0 refills | Status: DC
Start: 2020-03-02 — End: 2020-03-04

## 2020-03-02 NOTE — Discharge Instructions (Addendum)
Warm compresses.  Antibiotics as prescribed.  Follow up as needed for continued or worsening symptoms

## 2020-03-02 NOTE — ED Triage Notes (Signed)
C/O left axillary abscess - states noticed today for first time.  Denies drainage.

## 2020-03-03 ENCOUNTER — Other Ambulatory Visit: Payer: Self-pay

## 2020-03-03 ENCOUNTER — Emergency Department (HOSPITAL_COMMUNITY)
Admission: EM | Admit: 2020-03-03 | Discharge: 2020-03-03 | Disposition: A | Discharge status: Home / Self Care | Payer: Medicare Other | Attending: Emergency Medicine | Admitting: Emergency Medicine

## 2020-03-03 ENCOUNTER — Encounter (HOSPITAL_COMMUNITY): Payer: Self-pay | Admitting: *Deleted

## 2020-03-03 DIAGNOSIS — L02412 Cutaneous abscess of left axilla: Secondary | ICD-10-CM | POA: Diagnosis not present

## 2020-03-03 DIAGNOSIS — Z5321 Procedure and treatment not carried out due to patient leaving prior to being seen by health care provider: Secondary | ICD-10-CM | POA: Diagnosis not present

## 2020-03-03 NOTE — ED Triage Notes (Signed)
Called x one, no answer  

## 2020-03-03 NOTE — ED Provider Notes (Signed)
[place]    CSN: [id number] Arrival date & time: 03/02/20  1322      History   Chief Complaint Chief Complaint  Patient presents with  . Abscess    HPI Fran Housand is a 39 y.o. male.   Pt is a 39 year old male that presents with abscess to left axillary area. This has been present for the past day. Tender to touch. No fever, drainage.      Past Medical History:  Diagnosis Date  . Bipolar 1 disorder ([place])   . Chronic headaches   . Conversion disorder with seizures or convulsions   . 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 Depression   . Hx of electroencephalogram 12/2014   normal  . Mild cognitive impairment   . Psychogenic nonepileptic seizure   . Schizophrenic disorder ([place])   . Seizures [facility])     Patient Active Problem List   Diagnosis Date Noted  . Migraine without aura and without status migrainosus, not intractable 05/22/2018  . Vertigo 05/22/2018  . Psychogenic nonepileptic seizure 02/18/2017  . Cholelithiasis 01/24/2017  . 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 Spells of decreased attentiveness 03/16/2016  . Abnormal EKG 11/30/2015  . Elevated troponin 11/30/2015  . Bipolar affective disorder, most recent episode unspecified type, remission status unspecified 03/11/2015  . Bipolar affective disorder ([place]) 01/08/2015  . Generalized idiopathic epilepsy and epileptic syndromes, without status epilepticus, not intractable ([place]) 11/27/2014  . Seizure disorder ([place]) 10/14/2014    History reviewed. No pertinent surgical history.     Home Medications    Prior to Admission medications   Medication Sig Start Date End Date Taking? Authorizing Provider  QUEtiapine (SEROQUEL) 50 MG tablet Take 50 mg by mouth at bedtime.   Yes [provider]  albuterol (PROAIR HFA) 108 (90 Base) MCG/ACT inhaler Inhale 1-2 puffs into the lungs every  6 (six) hours as needed for wheezing or shortness of breath. 01/02/20   Zigmund Gottron, NP  doxycycline (VIBRAMYCIN) 100 MG capsule Take 1 capsule (100 mg total) by mouth 2 (two) times daily for 7 days. 03/02/20 03/09/20  Loura Halt A, NP  famotidine (PEPCID) 20 MG tablet Take 1 tablet (20 mg total) by mouth 2 (two) times daily for 7 days. 12/29/19 01/05/20  Darr, Marguerita Beards, PA-C  traMADol (ULTRAM) 50 MG tablet Take 1 tablet (50 mg total) by mouth every 6 (six) hours as needed. 12/02/19   Robyn Haber, MD  diphenhydrAMINE (BENADRYL) 25 MG tablet Take 2 tablets (50 mg total) by mouth at bedtime as needed for up to 7 days. 12/29/19 02/15/20  Darr, Marguerita Beards, PA-C  loratadine (CLARITIN) 10 MG tablet Take 1 tablet (10 mg total) by mouth daily. Patient not taking: Reported on 10/11/2019 09/18/19 12/02/19  Charlott Rakes, MD  Oxcarbazepine (TRILEPTAL) 300 MG tablet Take 1/2 tablet twice a day Patient not taking: Reported on 07/08/2019 01/26/18 07/09/19  Argentina Donovan, PA-C    Family History Family History  Problem Relation Age of Onset  . Heart disease Mother     Social History Social History   Tobacco Use  . Smoking status: Never Smoker  . Smokeless tobacco: Never Used  Vaping Use  . Vaping Use: Never used  Substance Use Topics  . Alcohol use: Not Currently    Alcohol/week: 0.0 standard drinks  . Drug use: No     Allergies  Coconut oil, Other, Codeine, Depakote [divalproex sodium], Hydroxyzine, Penicillins, Tape, Keflex [cephalexin], Latex, and Levetiracetam   Review of Systems Review of Systems   Physical Exam Triage Vital Signs ED Triage Vitals  Enc Vitals Group     BP 03/02/20 1520 128/77     Pulse Rate 03/02/20 1520 (!) 50     Resp 03/02/20 1520 18     Temp 03/02/20 1520 98.1 F (36.7 C)     Temp Source 03/02/20 1520 Oral     SpO2 03/02/20 1520 100 %     Weight --      Height --      Head Circumference --      Peak Flow --      Pain Score 03/02/20 1521 7     Pain Loc --        Pain Edu? --      Excl. in [place]? --    No data found.  Updated Vital Signs BP 128/77   Pulse (!) 50   Temp 98.1 F (36.7 C) (Oral)   Resp 18   SpO2 100%   Visual Acuity Right Eye Distance:   Left Eye Distance:   Bilateral Distance:    Right Eye Near:   Left Eye Near:    Bilateral Near:     Physical Exam Vitals and nursing note reviewed.  Constitutional:      Appearance: Normal appearance.  HENT:     Head: Normocephalic and atraumatic.     Nose: Nose normal.  Eyes:     Conjunctiva/sclera: Conjunctivae normal.  Pulmonary:     Effort: Pulmonary effort is normal.  Musculoskeletal:        General: Normal range of motion.     Cervical back: Normal range of motion.  Skin:    General: Skin is warm and dry.     Comments: Very small approx 1 cm abscess to left axilla, indurated, mild erythema.   Neurological:     Mental Status: He is alert.  Psychiatric:        Mood and Affect: Mood normal.      UC Treatments / Results  Labs (all labs ordered are listed, but only abnormal results are displayed) Labs Reviewed - No data to display  EKG   Radiology No results found.  Procedures Procedures (including critical care time)  Medications Ordered in UC Medications - No data to display  Initial Impression / Assessment and Plan / UC Course  I have reviewed the triage vital signs and the nursing notes.  Pertinent labs & imaging results that were available during my care of the patient were reviewed by me and considered in my medical decision making (see chart for details).     Abscess No indication for I&D today.  Warm compresses Doxy for abx coverage.  Follow up as needed for continued or worsening symptoms  Final Clinical Impressions(s) / UC Diagnoses   Final diagnoses:  Abscess     Discharge Instructions     Warm compresses.  Antibiotics as prescribed.  Follow up as needed for continued or worsening symptoms     ED Prescriptions     Medication Sig Dispense Auth. Provider   doxycycline (VIBRAMYCIN) 100 MG capsule Take 1 capsule (100 mg total) by mouth 2 (two) times daily for 7 days. 14 capsule Elmore Hyslop A, NP     PDMP not reviewed this encounter.   Orvan July, NP 03/03/20 1128

## 2020-03-03 NOTE — ED Triage Notes (Signed)
Pt c/o abscess to left axilla x 2 days; pt states he went to urgent care and was seen with no relief

## 2020-03-04 ENCOUNTER — Encounter (HOSPITAL_COMMUNITY): Payer: Self-pay

## 2020-03-04 ENCOUNTER — Other Ambulatory Visit: Payer: Self-pay

## 2020-03-04 ENCOUNTER — Emergency Department (HOSPITAL_COMMUNITY)
Admission: EM | Admit: 2020-03-04 | Discharge: 2020-03-04 | Disposition: A | Discharge status: Home / Self Care | Payer: Medicare Other | Attending: Emergency Medicine | Admitting: Emergency Medicine

## 2020-03-04 ENCOUNTER — Ambulatory Visit (HOSPITAL_COMMUNITY)
Admission: EM | Admit: 2020-03-04 | Discharge: 2020-03-04 | Disposition: A | Discharge status: Home / Self Care | Payer: Medicare Other | Attending: Urgent Care | Admitting: Urgent Care

## 2020-03-04 DIAGNOSIS — L662 Folliculitis decalvans: Secondary | ICD-10-CM | POA: Insufficient documentation

## 2020-03-04 DIAGNOSIS — Z91040 Latex allergy status: Secondary | ICD-10-CM | POA: Insufficient documentation

## 2020-03-04 DIAGNOSIS — R4689 Other symptoms and signs involving appearance and behavior: Secondary | ICD-10-CM

## 2020-03-04 DIAGNOSIS — L089 Local infection of the skin and subcutaneous tissue, unspecified: Secondary | ICD-10-CM

## 2020-03-04 DIAGNOSIS — M79622 Pain in left upper arm: Secondary | ICD-10-CM

## 2020-03-04 DIAGNOSIS — L739 Follicular disorder, unspecified: Secondary | ICD-10-CM

## 2020-03-04 DIAGNOSIS — R590 Localized enlarged lymph nodes: Secondary | ICD-10-CM | POA: Diagnosis not present

## 2020-03-04 DIAGNOSIS — R2232 Localized swelling, mass and lump, left upper limb: Secondary | ICD-10-CM | POA: Diagnosis present

## 2020-03-04 MED ORDER — NAPROXEN 500 MG PO TABS
500.0000 mg | ORAL_TABLET | Freq: Two times a day (BID) | ORAL | 0 refills | Status: DC
Start: 2020-03-04 — End: 2021-01-05

## 2020-03-04 MED ORDER — SULFAMETHOXAZOLE-TRIMETHOPRIM 800-160 MG PO TABS
1.0000 | ORAL_TABLET | Freq: Two times a day (BID) | ORAL | 0 refills | Status: DC
Start: 2020-03-04 — End: 2021-01-05

## 2020-03-04 MED ORDER — LIDOCAINE-EPINEPHRINE 1 %-1:100000 IJ SOLN
INTRAMUSCULAR | Status: AC
  Filled 2020-03-04: qty 1

## 2020-03-04 NOTE — ED Provider Notes (Signed)
[place] DEPT Provider Note   CSN: [id number] Arrival date & time: 03/04/20  1926     History No chief complaint on file.   Glen Kesinger is a 39 y.o. male history of bipolar, chronic headaches, convulsions, cognitive impairment, schizophrenia.  Patient presents today for evaluation of pain and swelling of his left axilla onset 2-3 days ago describes a moderate intensity throbbing pain constant worsened with palpation improved with rest, pain does not radiate.  Patient is concerned for an abscess.  He reports that he went to an urgent care yesterday and was prescribed an antibiotic which he has been taking without relief.  He denies fever/chills, abdominal pain, nausea/vomiting, numbness/weakness, tingling, drainage or any additional concerns. HPI     Past Medical History:  Diagnosis Date  . Bipolar 1 disorder ([place])   . Chronic headaches   . Conversion disorder with seizures or convulsions   . 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 Depression   . Hx of electroencephalogram 12/2014   normal  . Mild cognitive impairment   . Psychogenic nonepileptic seizure   . Schizophrenic disorder ([place])   . Seizures [facility])     Patient Active Problem List   Diagnosis Date Noted  . Migraine without aura and without status migrainosus, not intractable 05/22/2018  . Vertigo 05/22/2018  . Psychogenic nonepileptic seizure 02/18/2017  . Cholelithiasis 01/24/2017  . 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 Spells of decreased attentiveness 03/16/2016  . Abnormal EKG 11/30/2015  . Elevated troponin 11/30/2015  . Bipolar affective disorder, most recent episode unspecified type, remission status unspecified 03/11/2015  . Bipolar affective disorder ([place]) 01/08/2015  . Generalized idiopathic epilepsy and epileptic syndromes, without status epilepticus, not  intractable ([place]) 11/27/2014  . Seizure disorder ([place]) 10/14/2014    History reviewed. No pertinent surgical history.     Family History  Problem Relation Age of Onset  . Heart disease Mother     Social History   Tobacco Use  . Smoking status: Never Smoker  . Smokeless tobacco: Never Used  Vaping Use  . Vaping Use: Never used  Substance Use Topics  . Alcohol use: Not Currently    Alcohol/week: 0.0 standard drinks  . Drug use: No    Home Medications Prior to Admission medications   Medication Sig Start Date End Date Taking? Authorizing Provider  albuterol (PROAIR HFA) 108 (90 Base) MCG/ACT inhaler Inhale 1-2 puffs into the lungs every 6 (six) hours as needed for wheezing or shortness of breath. 01/02/20   Zigmund Gottron, NP  famotidine (PEPCID) 20 MG tablet Take 1 tablet (20 mg total) by mouth 2 (two) times daily for 7 days. 12/29/19 01/05/20  Darr, Marguerita Beards, PA-C  naproxen (NAPROSYN) 500 MG tablet Take 1 tablet (500 mg total) by mouth 2 (two) times daily with a meal. 03/04/20   Jaynee Eagles, PA-C  QUEtiapine (SEROQUEL) 50 MG tablet Take 50 mg by mouth at bedtime.    [provider]  sulfamethoxazole-trimethoprim (BACTRIM DS) 800-160 MG tablet Take 1 tablet by mouth 2 (two) times daily. 03/04/20   Jaynee Eagles, PA-C  traMADol (ULTRAM) 50 MG tablet Take 1 tablet (50 mg total) by mouth every 6 (six) hours as needed. 12/02/19   Robyn Haber, MD  diphenhydrAMINE (BENADRYL) 25 MG tablet Take 2 tablets (50 mg total) by mouth at bedtime as needed for up to 7 days. 12/29/19 02/15/20  Darr, Marguerita Beards, PA-C  loratadine (CLARITIN) 10 MG tablet Take 1 tablet (10 mg total) by mouth daily. Patient not taking: Reported on 10/11/2019 09/18/19 12/02/19  Charlott Rakes, MD  Oxcarbazepine (TRILEPTAL) 300 MG tablet Take 1/2 tablet twice a day Patient not taking: Reported on 07/08/2019 01/26/18 07/09/19  Argentina Donovan, PA-C    Allergies    Coconut oil, Other, Codeine, Depakote [divalproex sodium],  Hydroxyzine, Penicillins, Tape, Keflex [cephalexin], Latex, and Levetiracetam  Review of Systems   Review of Systems  Constitutional: Negative.  Negative for chills and fever.  Gastrointestinal: Negative.  Negative for abdominal pain, nausea and vomiting.  Skin:       Swelling left axilla    Physical Exam Updated Vital Signs BP (!) 126/91 (BP Location: Right Arm)   Pulse (!) 54   Temp 98 F (36.7 C) (Oral)   Resp 18   Ht 6' (1.829 m)   Wt 70.3 kg   SpO2 100%   BMI 21.02 kg/m   Physical Exam Constitutional:      General: He is not in acute distress.    Appearance: Normal appearance. He is well-developed. He is not ill-appearing or diaphoretic.  HENT:     Head: Normocephalic and atraumatic.     Right Ear: External ear normal.     Left Ear: External ear normal.  Eyes:     General: Vision grossly intact. Gaze aligned appropriately.     Pupils: Pupils are equal, round, and reactive to light.  Neck:     Trachea: Trachea and phonation normal.  Cardiovascular:     Rate and Rhythm: Normal rate and regular rhythm.     Pulses:          Radial pulses are 2+ on the right side and 2+ on the left side.  Pulmonary:     Effort: Pulmonary effort is normal. No respiratory distress.  Abdominal:     General: There is no distension.     Palpations: Abdomen is soft.     Tenderness: There is no abdominal tenderness. There is no guarding or rebound.  Musculoskeletal:        General: Normal range of motion.     Cervical back: Normal range of motion.  Lymphadenopathy:     Upper Body:     Left upper body: Axillary adenopathy present.  Skin:    General: Skin is warm and dry.          Comments: Single pustule present to a follicle of the left axilla.  Around 1.5 inches medially is a subcentimeter lymph node.  Neurological:     Mental Status: He is alert.     GCS: GCS eye subscore is 4. GCS verbal subscore is 5. GCS motor subscore is 6.     Comments: Speech is clear and goal oriented,  follows commands Major Cranial nerves without deficit, no facial droop Moves extremities without ataxia, coordination intact  Psychiatric:        Behavior: Behavior normal.     ED Results / Procedures / Treatments   Labs (all labs ordered are listed, but only abnormal results are displayed) Labs Reviewed - No data to display  EKG None  Radiology No results found.  Procedures Ultrasound ED Soft Tissue  Date/Time: 03/04/2020 10:31 PM Performed by: Deliah Boston, PA-C Authorized by: Deliah Boston, PA-C   Procedure details:    Indications: localization of abscess and evaluate for cellulitis     Transverse view:  Visualized  Longitudinal view:  Visualized   Images: archived     Limitations:  Patient compliance Location:    Location: axilla     Side:  Left Findings:     no abscess present    no cellulitis present    no foreign body present Comments:     Appearance consistent with lymphnode.   (including critical care time)  Medications Ordered in ED Medications - No data to display  ED Course  I have reviewed the triage vital signs and the nursing notes.  Pertinent labs & imaging results that were available during my care of the patient were reviewed by me and considered in my medical decision making (see chart for details).    MDM Rules/Calculators/A&P                         Additional history obtained from: 1. Nursing notes from this visit. 2. Review of electronic medical record.  Patient was seen in urgent care on 03/02/2020 diagnosis of abscess he was started on doxycycline 100 mg twice daily warm compresses were encouraged and there is no indication for incision and drainage.  It appears patient then left without being seen from [facility] ER yesterday.  He was then seen again at an urgent care today, clinical impression of uncooperative behavior and skin infection and left axillary pain.  It appears he was started on Bactrim and naproxen and  doxycycline was discontinued, there is no note from that visit at this time. ----------------- 39 year old male today presented again for left axillary pain.  He has a small area of folliculitis to the left axilla just medial to that he has a subcentimeter swollen lymph node, this was evaluated with ultrasound, no abscess was present and this appears consistent with lymph node on ultrasound.  Suspect swollen lymph node may be secondary to the small single pustule presently axilla consistent with small folliculitis.  Patient will need to continue his warm compresses.  Possible that patient did not take his antibiotic doxycycline for long enough initially which is why symptoms may not have improved, he was switched to Bactrim today which should be appropriate treatment, I encourage patient to complete his Bactrim prescription and to have the area rechecked in 2-3 days.  At this time there does not appear to be any evidence of an acute emergency medical condition and the patient appears stable for discharge with appropriate outpatient follow up. Diagnosis was discussed with patient who verbalizes understanding of care plan and is agreeable to discharge. I have discussed return precautions with patient who verbalizes understanding. Patient encouraged to follow-up with their PCP. All questions answered.   Note: Portions of this report may have been transcribed using voice recognition software. Every effort was made to ensure accuracy; however, inadvertent computerized transcription errors may still be present. Final Clinical Impression(s) / ED Diagnoses Final diagnoses:  Folliculitis  Axillary lymphadenopathy    Rx / DC Orders ED Discharge Orders    None       Gari Crown 03/04/20 2234    Hayden Rasmussen, MD 03/05/20 1050

## 2020-03-04 NOTE — ED Triage Notes (Signed)
Pt presents for follow up for abscess on left underarm X 4 days; pt states he has had no relief with medication.

## 2020-03-04 NOTE — ED Triage Notes (Signed)
Pt has a boil under his left arm for three days, he went to Urgent Care and they wanted to lance it without numbing him, they did give him antibiotics

## 2020-03-04 NOTE — Discharge Instructions (Addendum)
You have a small abscess. Do to your pain and inability to remain still for a procedure, we will switch your antibiotic from doxycycline to Bactrim. Use naproxen for pain and inflammation. Use warm compresses 3-5 times daily, ~10 minutes each time.  If your wound worsen such as more pain, redness, more swelling, fevers then please report to the emergency room as you will likely need sedation for an incision and drainage.

## 2020-03-04 NOTE — Discharge Instructions (Addendum)
At this time there does not appear to be the presence of an emergent medical condition, however there is always the potential for conditions to change. Please read and follow the below instructions.  Please return to the Emergency Department immediately for any new or worsening symptoms. Please be sure to follow up with your Primary Care Provider within one week regarding your visit today; please call their office to schedule an appointment even if you are feeling better for a follow-up visit. Please take the antibiotic prescribed to you by the urgent care until complete.  Please drink plenty of water to avoid dehydration and get plenty of rest.  Use warm compresses to your left armpit to help facilitate healing.  Please have the area rechecked in 2 days for improvement.  If you have worsening symptoms do not wait for 2 days and instead return immediately to the emergency department.  Go to the nearest Emergency Department immediately if: You have fever or chills Fluid leaking from an enlarged lymph gland. Severe pain. Chest pain. Shortness of breath. You have more redness, swelling, or pain in the affected area. Red streaks are spreading from the affected area. You have any new/concerning or worsening of symptoms   Please read the additional information packets attached to your discharge summary.  Do not take your medicine if  develop an itchy rash, swelling in your mouth or lips, or difficulty breathing; call 911 and seek immediate emergency medical attention if this occurs.  You may review your lab tests and imaging results in their entirety on your MyChart account.  Please discuss all results of fully with your primary care provider and other specialist at your follow-up visit.  Note: Portions of this text may have been transcribed using voice recognition software. Every effort was made to ensure accuracy; however, inadvertent computerized transcription errors may still be present.

## 2020-03-05 ENCOUNTER — Encounter (HOSPITAL_COMMUNITY): Payer: Self-pay

## 2020-03-05 ENCOUNTER — Emergency Department (HOSPITAL_COMMUNITY)
Admission: EM | Admit: 2020-03-05 | Discharge: 2020-03-05 | Disposition: A | Discharge status: Home / Self Care | Payer: Medicare Other | Attending: Emergency Medicine | Admitting: Emergency Medicine

## 2020-03-05 DIAGNOSIS — Z91040 Latex allergy status: Secondary | ICD-10-CM | POA: Diagnosis not present

## 2020-03-05 DIAGNOSIS — L02412 Cutaneous abscess of left axilla: Secondary | ICD-10-CM | POA: Diagnosis not present

## 2020-03-05 DIAGNOSIS — M79622 Pain in left upper arm: Secondary | ICD-10-CM | POA: Diagnosis present

## 2020-03-05 MED ORDER — LIDOCAINE-EPINEPHRINE 1 %-1:100000 IJ SOLN
5.0000 mL | Freq: Once | INTRAMUSCULAR | Status: AC
  Administered 2020-03-05: 5 mL

## 2020-03-05 NOTE — ED Triage Notes (Signed)
Pt requesting abscess under left arm to be drained.

## 2020-03-05 NOTE — ED Provider Notes (Addendum)
[place] EMERGENCY DEPARTMENT Provider Note   CSN: [id number] Arrival date & time: 03/05/20  1518     History Chief Complaint  Patient presents with  . Abscess    [name] is a 39 y.o. male presenting to the emergency department for reevaluation of left underarm pain and swelling.  He has been evaluated every day since 03/02/2020.  Initially he was seen at urgent care, started on doxycycline.  Yesterday he was evaluated in the ED had bedside ultrasound done which was suggestive of a possible lymph node attributed to a mild folliculitis.  His antibiotic was switched to Bactrim.  He states his symptoms have not been relieved.  He has been intermittently taking Tylenol or ibuprofen.  He has had a total of 3 doses of Bactrim.  States he has been applying warm compresses.  No fevers or chills.  The history is provided by the patient and medical records.       Past Medical History:  Diagnosis Date  . Bipolar 1 disorder ([place])   . Chronic headaches   . Conversion disorder with seizures or convulsions   . 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 Depression   . Hx of electroencephalogram 12/2014   normal  . Mild cognitive impairment   . Psychogenic nonepileptic seizure   . Schizophrenic disorder ([place])   . Seizures [facility])     Patient Active Problem List   Diagnosis Date Noted  . Migraine without aura and without status migrainosus, not intractable 05/22/2018  . Vertigo 05/22/2018  . Psychogenic nonepileptic seizure 02/18/2017  . Cholelithiasis 01/24/2017  . 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 Spells of decreased attentiveness 03/16/2016  . Abnormal EKG 11/30/2015  . Elevated troponin 11/30/2015  . Bipolar affective disorder, most recent episode unspecified type, remission status unspecified 03/11/2015  . Bipolar affective disorder ([place]) 01/08/2015  .  Generalized idiopathic epilepsy and epileptic syndromes, without status epilepticus, not intractable ([place]) 11/27/2014  . Seizure disorder ([place]) 10/14/2014    History reviewed. No pertinent surgical history.     Family History  Problem Relation Age of Onset  . Heart disease Mother     Social History   Tobacco Use  . Smoking status: Never Smoker  . Smokeless tobacco: Never Used  Vaping Use  . Vaping Use: Never used  Substance Use Topics  . Alcohol use: Not Currently    Alcohol/week: 0.0 standard drinks  . Drug use: No    Home Medications Prior to Admission medications   Medication Sig Start Date End Date Taking? Authorizing Provider  albuterol (PROAIR HFA) 108 (90 Base) MCG/ACT inhaler Inhale 1-2 puffs into the lungs every 6 (six) hours as needed for wheezing or shortness of breath. 01/02/20   Zigmund Gottron, NP  famotidine (PEPCID) 20 MG tablet Take 1 tablet (20 mg total) by mouth 2 (two) times daily for 7 days. 12/29/19 01/05/20  Darr, Marguerita Beards, PA-C  naproxen (NAPROSYN) 500 MG tablet Take 1 tablet (500 mg total) by mouth 2 (two) times daily with a meal. 03/04/20   Jaynee Eagles, PA-C  QUEtiapine (SEROQUEL) 50 MG tablet Take 50 mg by mouth at bedtime.    [provider]  sulfamethoxazole-trimethoprim (BACTRIM DS) 800-160 MG tablet Take 1 tablet by mouth 2 (two) times daily. 03/04/20   Jaynee Eagles, PA-C  traMADol (ULTRAM) 50 MG tablet Take 1 tablet (50 mg total) by mouth  every 6 (six) hours as needed. 12/02/19   Robyn Haber, MD  diphenhydrAMINE (BENADRYL) 25 MG tablet Take 2 tablets (50 mg total) by mouth at bedtime as needed for up to 7 days. 12/29/19 02/15/20  Darr, Marguerita Beards, PA-C  loratadine (CLARITIN) 10 MG tablet Take 1 tablet (10 mg total) by mouth daily. Patient not taking: Reported on 10/11/2019 09/18/19 12/02/19  Charlott Rakes, MD  Oxcarbazepine (TRILEPTAL) 300 MG tablet Take 1/2 tablet twice a day Patient not taking: Reported on 07/08/2019 01/26/18 07/09/19  Argentina Donovan, PA-C    Allergies    Coconut oil, Other, Codeine, Depakote [divalproex sodium], Hydroxyzine, Penicillins, Tape, Keflex [cephalexin], Latex, and Levetiracetam  Review of Systems   Review of Systems  Constitutional: Negative for fever.  Skin: Positive for color change.  All other systems reviewed and are negative.   Physical Exam Updated Vital Signs BP (!) 115/95 (BP Location: Right Arm)   Pulse (!) 56   Temp 98.4 F (36.9 C) (Oral)   Resp 18   SpO2 100%   Physical Exam Vitals and nursing note reviewed.  Constitutional:      General: He is not in acute distress.    Appearance: He is well-developed.  HENT:     Head: Normocephalic and atraumatic.  Eyes:     Conjunctiva/sclera: Conjunctivae normal.  Cardiovascular:     Rate and Rhythm: Normal rate.  Pulmonary:     Effort: Pulmonary effort is normal.  Skin:    Comments: Left axilla with a small pustule. Just medial to this is a 1.5-2cm round area of erythema and induration. This area is tender and slightly fluctuant, not mobile. No other palpable surrounding adenopathy.  Neurological:     Mental Status: He is alert.  Psychiatric:        Mood and Affect: Mood normal.        Behavior: Behavior normal.     ED Results / Procedures / Treatments   Labs (all labs ordered are listed, but only abnormal results are displayed) Labs Reviewed - No data to display  EKG None  Radiology No results found.  Procedures Procedures (including critical care time) EMERGENCY DEPARTMENT US SOFT TISSUE INTERPRETATION "Study: Limited Soft Tissue Ultrasound"  INDICATIONS: Pain and Soft tissue infection Multiple views of the body part were obtained in real-time with a multi-frequency linear probe  PERFORMED BY: Myself IMAGES ARCHIVED?: Yes SIDE:Left BODY PART:Axilla INTERPRETATION:  Abcess present and subdermal rounded fluid collection is visualized, no surrounding capsule or regular border, appears loculated, no distinct blood  flow is noted within the collection - favoring abscess    Medications Ordered in ED Medications  lidocaine-EPINEPHrine (XYLOCAINE W/EPI) 1 %-1:100000 (with pres) injection 5 mL (5 mLs Infiltration Given by Other 03/05/20 1752)    ED Course  I have reviewed the triage vital signs and the nursing notes.  Pertinent labs & imaging results that were available during my care of the patient were reviewed by me and considered in my medical decision making (see chart for details).    MDM Rules/Calculators/A&P                          Patient presenting to the emergency department with request for I&D of his left axilla.  He has been seen in the ED multiple days in a row for this, initially was taking doxycycline and is now taking Bactrim.  He was thought to be a small abscess in amenable to  incision and drainage upon initial presentation, bedside ultrasound yesterday seemed more consistent with enlarged lymph node.  Bedside ultrasound was performed again today due to localized erythema and induration concerning for developing abscess.  Ultrasound findings seem most consistent with small abscess, less likely lymph node though is still a possibility.  Discussed this with patient.  He strongly requested drainage.  Discussed possibility of this being a lymph node, patient understood the risks and wished to proceed.  Upon beginning the injection of anesthetic, patient began shouting at the top of his lungs and did not tolerate any part of the anesthetic. The needle only went into the skin by about 1-33mm in depth, no anesthetic was injected as patient moved and the needle came out of the skin. Procedure terminated by myself for intolerance and hysteria from patient.  Patient agreed with termination.  Recommend he continue warm compresses, continue taking antibiotic as directed and follow-up with PCP.  Discussed reasons to return to the ED which include fevers, significantly worsening swelling after at least 2 days  of antibiotics.  Final Clinical Impression(s) / ED Diagnoses Final diagnoses:  Abscess of left axilla    Rx / DC Orders ED Discharge Orders    None       Mersadies Petree, [place] N, PA-C 03/05/20 1830    Cortavious Nix, [place] N, PA-C 03/05/20 1832    Hayden Rasmussen, MD 03/06/20 1029

## 2020-03-05 NOTE — Discharge Instructions (Signed)
Please read instructions below.  Keep your wound clean and covered. Soak/flush your wound with warm water, multiple times per day. You can take Advil/ibuprofen every 6 hours as needed for pain. Continue taking your antibiotics as directed until gone.  Return to the ER for fever, worsening redness, or new or worsening symptoms.

## 2020-03-09 ENCOUNTER — Emergency Department (HOSPITAL_COMMUNITY)
Admission: EM | Admit: 2020-03-09 | Discharge: 2020-03-09 | Disposition: A | Discharge status: Home / Self Care | Payer: Medicare Other | Attending: Emergency Medicine | Admitting: Emergency Medicine

## 2020-03-09 DIAGNOSIS — L02414 Cutaneous abscess of left upper limb: Secondary | ICD-10-CM | POA: Diagnosis not present

## 2020-03-09 DIAGNOSIS — Z91040 Latex allergy status: Secondary | ICD-10-CM | POA: Diagnosis not present

## 2020-03-09 DIAGNOSIS — L0291 Cutaneous abscess, unspecified: Secondary | ICD-10-CM

## 2020-03-09 MED ORDER — OXYCODONE-ACETAMINOPHEN 5-325 MG PO TABS
1.0000 | ORAL_TABLET | Freq: Once | ORAL | Status: AC
  Administered 2020-03-09: 1 via ORAL
  Filled 2020-03-09: qty 1

## 2020-03-09 MED ORDER — TRAMADOL HCL 50 MG PO TABS: 50.0000 mg | ORAL_TABLET | Freq: Four times a day (QID) | ORAL | 0 refills | Status: DC | PRN

## 2020-03-09 NOTE — ED Provider Notes (Signed)
[place] EMERGENCY DEPARTMENT Provider Note   CSN: [id number] Arrival date & time: 03/09/20  2297     History Chief Complaint  Patient presents with  . knot under left arm    Frank[name] is a 39 y.o. male who presents to ED with a chief complaint of abscess.  For the past 6 days has noticed an area of swelling under his left armpit.  He has been seen between the urgent care and ER 3 times for this.  Most recently 3 days ago did not tolerate attempt at I&D.  He was placed on Bactrim.  States that his main concern today is ongoing pain and trouble sleeping.  He reports spontaneous drainage from the area.  He denies any fever, shortness of breath or increase in swelling.  HPI     Past Medical History:  Diagnosis Date  . Bipolar 1 disorder ([place])   . Chronic headaches   . Conversion disorder with seizures or convulsions   . 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 Depression   . Hx of electroencephalogram 12/2014   normal  . Mild cognitive impairment   . Psychogenic nonepileptic seizure   . Schizophrenic disorder ([place])   . Seizures [facility])     Patient Active Problem List   Diagnosis Date Noted  . Migraine without aura and without status migrainosus, not intractable 05/22/2018  . Vertigo 05/22/2018  . Psychogenic nonepileptic seizure 02/18/2017  . Cholelithiasis 01/24/2017  . 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 Spells of decreased attentiveness 03/16/2016  . Abnormal EKG 11/30/2015  . Elevated troponin 11/30/2015  . Bipolar affective disorder, most recent episode unspecified type, remission status unspecified 03/11/2015  . Bipolar affective disorder ([place]) 01/08/2015  . Generalized idiopathic epilepsy and epileptic syndromes, without status epilepticus, not intractable ([place]) 11/27/2014  . Seizure disorder ([place]) 10/14/2014    No past surgical  history on file.     Family History  Problem Relation Age of Onset  . Heart disease Mother     Social History   Tobacco Use  . Smoking status: Never Smoker  . Smokeless tobacco: Never Used  Vaping Use  . Vaping Use: Never used  Substance Use Topics  . Alcohol use: Not Currently    Alcohol/week: 0.0 standard drinks  . Drug use: No    Home Medications Prior to Admission medications   Medication Sig Start Date End Date Taking? Authorizing Provider  albuterol (PROAIR HFA) 108 (90 Base) MCG/ACT inhaler Inhale 1-2 puffs into the lungs every 6 (six) hours as needed for wheezing or shortness of breath. 01/02/20   Zigmund Gottron, NP  famotidine (PEPCID) 20 MG tablet Take 1 tablet (20 mg total) by mouth 2 (two) times daily for 7 days. 12/29/19 01/05/20  Darr, Marguerita Beards, PA-C  naproxen (NAPROSYN) 500 MG tablet Take 1 tablet (500 mg total) by mouth 2 (two) times daily with a meal. 03/04/20   Jaynee Eagles, PA-C  QUEtiapine (SEROQUEL) 50 MG tablet Take 50 mg by mouth at bedtime.    [provider]  sulfamethoxazole-trimethoprim (BACTRIM DS) 800-160 MG tablet Take 1 tablet by mouth 2 (two) times daily. 03/04/20   Jaynee Eagles, PA-C  traMADol (ULTRAM) 50 MG tablet Take 1 tablet (50 mg total) by mouth every 6 (six) hours as needed. 03/09/20   Merit Maybee, PA-C  diphenhydrAMINE (BENADRYL) 25 MG tablet Take 2 tablets (50 mg  total) by mouth at bedtime as needed for up to 7 days. 12/29/19 02/15/20  Darr, Marguerita Beards, PA-C  loratadine (CLARITIN) 10 MG tablet Take 1 tablet (10 mg total) by mouth daily. Patient not taking: Reported on 10/11/2019 09/18/19 12/02/19  Charlott Rakes, MD  Oxcarbazepine (TRILEPTAL) 300 MG tablet Take 1/2 tablet twice a day Patient not taking: Reported on 07/08/2019 01/26/18 07/09/19  Argentina Donovan, PA-C    Allergies    Coconut oil, Other, Codeine, Depakote [divalproex sodium], Hydroxyzine, Penicillins, Tape, Keflex [cephalexin], Latex, and Levetiracetam  Review of Systems     Review of Systems  Constitutional: Negative for chills and fever.  Respiratory: Negative for shortness of breath.   Skin: Positive for wound.    Physical Exam Updated Vital Signs BP 137/75 (BP Location: Right Arm)   Pulse 61   Temp 98.1 F (36.7 C) (Oral)   Resp 18   SpO2 100%   Physical Exam Vitals and nursing note reviewed.  Constitutional:      General: He is not in acute distress.    Appearance: He is well-developed. He is not diaphoretic.  HENT:     Head: Normocephalic and atraumatic.  Eyes:     General: No scleral icterus.    Conjunctiva/sclera: Conjunctivae normal.  Pulmonary:     Effort: Pulmonary effort is normal. No respiratory distress.  Musculoskeletal:     Cervical back: Normal range of motion.  Skin:    Findings: No rash.     Comments: Purulent drainage noted from left armpit without erythema.  There is slight induration.  Neurological:     Mental Status: He is alert.     ED Results / Procedures / Treatments   Labs (all labs ordered are listed, but only abnormal results are displayed) Labs Reviewed - No data to display  EKG None  Radiology No results found.  Procedures Procedures (including critical care time)  Medications Ordered in ED Medications  oxyCODONE-acetaminophen (PERCOCET/ROXICET) 5-325 MG per tablet 1 tablet (has no administration in time range)    ED Course  I have reviewed the triage vital signs and the nursing notes.  Pertinent labs & imaging results that were available during my care of the patient were reviewed by me and considered in my medical decision making (see chart for details).    MDM Rules/Calculators/A&P                          39 year old male presenting to the ED with a chief complaint of abscess.  Has had swelling for the past 6 days.  This is his fourth visit for the same.  He is on antibiotics.  He reports spontaneous drainage now.  His main concern is pain.  He is hesitant on me fully examining the area  or attempting any further incision.  Apparently NSAIDs are not working for pain.  Will add short course of tramadol.  We will have him return for worsening symptoms.   Patient is hemodynamically stable, in NAD, and able to ambulate in the ED. Evaluation does not show pathology that would require ongoing emergent intervention or inpatient treatment. I explained the diagnosis to the patient. Pain has been managed and has no complaints prior to discharge. Patient is comfortable with above plan and is stable for discharge at this time. All questions were answered prior to disposition. Strict return precautions for returning to the ED were discussed. Encouraged follow up with PCP.   Prior to providing  a prescription for a controlled substance, I independently reviewed the patient's recent prescription history on the [place]. The patient had no recent or regular prescriptions and was deemed appropriate for a brief, less than 3 day prescription of narcotic for acute analgesia.  An After Visit Summary was printed and given to the patient.   Portions of this note were generated with [employer]. Dictation errors may occur despite best attempts at proofreading.  Final Clinical Impression(s) / ED Diagnoses Final diagnoses:  Abscess    Rx / DC Orders ED Discharge Orders         Ordered    traMADol (ULTRAM) 50 MG tablet  Every 6 hours PRN        03/09/20 1013           Delia Heady, PA-C 03/09/20 1014    Gareth Morgan, MD 03/11/20 1501

## 2020-03-09 NOTE — Discharge Instructions (Signed)
Continue taking antibiotics. Take the pain medicine as needed. Return to the ER if you start to experience worsening pain, swelling, fever or chest pain.

## 2020-03-09 NOTE — ED Triage Notes (Signed)
Pt co knot/abcess under left arm that has been there 6-7days. Pt rates it a 10. Pt almost done with course of abx. Not helping. Denies and fevers or cold symptoms.

## 2020-03-11 ENCOUNTER — Encounter (HOSPITAL_COMMUNITY): Payer: Self-pay | Admitting: Emergency Medicine

## 2020-03-11 ENCOUNTER — Emergency Department (HOSPITAL_COMMUNITY)
Admission: EM | Admit: 2020-03-11 | Discharge: 2020-03-11 | Disposition: A | Discharge status: Home / Self Care | Payer: Medicare Other | Attending: Emergency Medicine | Admitting: Emergency Medicine

## 2020-03-11 ENCOUNTER — Other Ambulatory Visit: Payer: Self-pay

## 2020-03-11 DIAGNOSIS — Z5321 Procedure and treatment not carried out due to patient leaving prior to being seen by health care provider: Secondary | ICD-10-CM | POA: Diagnosis not present

## 2020-03-11 DIAGNOSIS — L02414 Cutaneous abscess of left upper limb: Secondary | ICD-10-CM | POA: Diagnosis not present

## 2020-03-11 NOTE — ED Triage Notes (Signed)
Pt arrives to ED with complaints of abscess under left arm. Had multiple ER trips over the past week for this. States it is still painful and not getting better.

## 2020-03-12 ENCOUNTER — Emergency Department (HOSPITAL_COMMUNITY)
Admission: EM | Admit: 2020-03-12 | Discharge: 2020-03-12 | Disposition: A | Discharge status: Home / Self Care | Payer: Medicare Other | Attending: Emergency Medicine | Admitting: Emergency Medicine

## 2020-03-12 DIAGNOSIS — L02412 Cutaneous abscess of left axilla: Secondary | ICD-10-CM

## 2020-03-12 DIAGNOSIS — Z91040 Latex allergy status: Secondary | ICD-10-CM | POA: Diagnosis not present

## 2020-03-12 MED ORDER — LIDOCAINE HCL (PF) 1 % IJ SOLN
5.0000 mL | Freq: Once | INTRAMUSCULAR | Status: AC
  Administered 2020-03-12: 5 mL
  Filled 2020-03-12: qty 5

## 2020-03-12 NOTE — ED Provider Notes (Signed)
[name] Provider Note   CSN: [id number] Arrival date & time: 03/12/20  1350     History Chief Complaint  Patient presents with  . Abscess    Franklin [name] is a 39 y.o. male.  Patient presents with an abscess in the left axillae. It has been present for over a week. He has been seen several times, but would not allow I&D due to "fear of needles". He has been on oral  antibiotics, and he is attempting warm compresses at homeThe abscess has spontaneously opened, but significant fluctuance remains.  The history is provided by the patient and medical records. No language interpreter was used.  Abscess Location:  Shoulder/arm Shoulder/arm abscess location:  L axilla Abscess quality: draining, fluctuance and painful   Red streaking: no   Duration:  1 week Progression:  Partially resolved Pain details:    Severity:  Moderate   Timing:  Constant Chronicity:  New      Past Medical History:  Diagnosis Date  . Bipolar 1 disorder ([place])   . Chronic headaches   . Conversion disorder with seizures or convulsions   . 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 Depression   . Hx of electroencephalogram 12/2014   normal  . Mild cognitive impairment   . Psychogenic nonepileptic seizure   . Schizophrenic disorder ([place])   . Seizures [facility])     Patient Active Problem List   Diagnosis Date Noted  . Migraine without aura and without status migrainosus, not intractable 05/22/2018  . Vertigo 05/22/2018  . Psychogenic nonepileptic seizure 02/18/2017  . Cholelithiasis 01/24/2017  . 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 Spells of decreased attentiveness 03/16/2016  . Abnormal EKG 11/30/2015  . Elevated troponin 11/30/2015  . Bipolar affective disorder, most recent episode unspecified type, remission status unspecified 03/11/2015  . Bipolar  affective disorder ([place]) 01/08/2015  . Generalized idiopathic epilepsy and epileptic syndromes, without status epilepticus, not intractable ([place]) 11/27/2014  . Seizure disorder ([place]) 10/14/2014    No past surgical history on file.     Family History  Problem Relation Age of Onset  . Heart disease Mother     Social History   Tobacco Use  . Smoking status: Never Smoker  . Smokeless tobacco: Never Used  Vaping Use  . Vaping Use: Never used  Substance Use Topics  . Alcohol use: Not Currently    Alcohol/week: 0.0 standard drinks  . Drug use: No    Home Medications Prior to Admission medications   Medication Sig Start Date End Date Taking? Authorizing Provider  albuterol (PROAIR HFA) 108 (90 Base) MCG/ACT inhaler Inhale 1-2 puffs into the lungs every 6 (six) hours as needed for wheezing or shortness of breath. 01/02/20   Zigmund Gottron, NP  famotidine (PEPCID) 20 MG tablet Take 1 tablet (20 mg total) by mouth 2 (two) times daily for 7 days. 12/29/19 01/05/20  Darr, Marguerita Beards, PA-C  naproxen (NAPROSYN) 500 MG tablet Take 1 tablet (500 mg total) by mouth 2 (two) times daily with a meal. 03/04/20   Jaynee Eagles, PA-C  QUEtiapine (SEROQUEL) 50 MG tablet Take 50 mg by mouth at bedtime.    [provider]  sulfamethoxazole-trimethoprim (BACTRIM DS) 800-160 MG tablet Take 1 tablet by mouth 2 (two) times daily. 03/04/20   Jaynee Eagles, PA-C  traMADol (ULTRAM) 50 MG tablet Take 1 tablet (50 mg total)  by mouth every 6 (six) hours as needed. 03/09/20   Khatri, [place], PA-C  diphenhydrAMINE (BENADRYL) 25 MG tablet Take 2 tablets (50 mg total) by mouth at bedtime as needed for up to 7 days. 12/29/19 02/15/20  Darr, Marguerita Beards, PA-C  loratadine (CLARITIN) 10 MG tablet Take 1 tablet (10 mg total) by mouth daily. Patient not taking: Reported on 10/11/2019 09/18/19 12/02/19  Charlott Rakes, MD  Oxcarbazepine (TRILEPTAL) 300 MG tablet Take 1/2 tablet twice a day Patient not taking: Reported on 07/08/2019 01/26/18  07/09/19  Argentina Donovan, PA-C    Allergies    Coconut oil, Other, Codeine, Depakote [divalproex sodium], Hydroxyzine, Penicillins, Tape, Keflex [cephalexin], Latex, and Levetiracetam  Review of Systems   Review of Systems  All other systems reviewed and are negative.   Physical Exam Updated Vital Signs BP 133/76 (BP Location: Right Arm)   Pulse 66   Temp 98.3 F (36.8 C) (Oral)   Resp 16   Ht 6' (1.829 m)   Wt 70.3 kg   SpO2 100%   BMI 21.02 kg/m   Physical Exam Vitals and nursing note reviewed.  HENT:     Head: Normocephalic.     Mouth/Throat:     Mouth: Mucous membranes are moist.  Eyes:     Conjunctiva/sclera: Conjunctivae normal.  Cardiovascular:     Rate and Rhythm: Normal rate.  Pulmonary:     Effort: Pulmonary effort is normal.  Abdominal:     Palpations: Abdomen is soft.  Musculoskeletal:        General: Normal range of motion.  Skin:    General: Skin is warm and dry.     Findings: Lesion present.  Neurological:     Mental Status: He is alert and oriented to person, place, and time.  Psychiatric:        Mood and Affect: Mood normal.        Behavior: Behavior normal.     ED Results / Procedures / Treatments   Labs (all labs ordered are listed, but only abnormal results are displayed) Labs Reviewed - No data to display  EKG None  Radiology No results found.  Procedures Procedures (including critical care time)  Medications Ordered in ED Medications - No data to display  ED Course  I have reviewed the triage vital signs and the nursing notes.  Pertinent labs & imaging results that were available during my care of the patient were reviewed by me and considered in my medical decision making (see chart for details).  Patient would not allow me to perform the I&D. The abscess has begun to drain. Explained to the patient that incomplete drainage may lead to a worsening abscess and extension of infection to deeper structures. Patient will  continue to attempt warm compresses at home to facilitate drainage.    MDM Rules/Calculators/A&P                          Patient with skin abscess. Patient would not allow incision and drainage to be performed in the ED today.   Supportive care and return precautions discussed.  The patient appears reasonably screened and/or stabilized for discharge and I doubt any other emergent medical condition requiring further screening, evaluation, or treatment in the ED prior to discharge.  Final Clinical Impression(s) / ED Diagnoses Final diagnoses:  Abscess of left axilla    Rx / DC Orders ED Discharge Orders    None  Etta Quill, NP 03/12/20 [place] Nordmann    Charlesetta Shanks, MD 03/12/20 2337

## 2020-03-12 NOTE — ED Triage Notes (Signed)
Pt with abscess to L arm that makes him feel like he is going to pass out x 1 week. Seen for same multiple times but left AMA yesterday d/t being afraid of needles. Picked up from [place], where he works after his sister left him in charge.

## 2020-03-12 NOTE — Discharge Instructions (Signed)
Please complete the antibiotic you were started on earlier. Continue with warm compresses to the area. You may eventually need to allow [place] or your provider to complete the incision and drainage procedure.

## 2020-03-17 ENCOUNTER — Ambulatory Visit (HOSPITAL_COMMUNITY): Payer: Medicare Other | Admitting: Psychiatry

## 2020-03-31 ENCOUNTER — Encounter: Payer: Self-pay | Admitting: *Deleted

## 2020-04-01 ENCOUNTER — Ambulatory Visit (INDEPENDENT_AMBULATORY_CARE_PROVIDER_SITE_OTHER): Payer: Medicare Other | Admitting: Diagnostic Neuroimaging

## 2020-04-01 ENCOUNTER — Encounter: Payer: Self-pay | Admitting: Diagnostic Neuroimaging

## 2020-04-01 VITALS — BP 119/78 | HR 58 | Ht 72.0 in | Wt 164.0 lb

## 2020-04-01 DIAGNOSIS — F445 Conversion disorder with seizures or convulsions: Secondary | ICD-10-CM

## 2020-04-01 NOTE — Progress Notes (Signed)
[facility]  PATIENT: Franklin [name] DOB: [birth year] see other record for Franklin [name] (MRNs [id number] and [id number]) --> needs record merge  REFERRING CLINICIAN: Cipriano Mile, NP HISTORY FROM: patient  REASON FOR VISIT: new consult    HISTORICAL  CHIEF COMPLAINT:  Chief Complaint  Patient presents with  . Dizziness    rm 6 New Pt "I have seizures, since I was a kid"  . Seizure-like activity    HISTORY OF PRESENT ILLNESS:   UPDATE (04/01/20, VRP): 39 year old male with history of bipolar disorder, schizophrenia, here for evaluation of abnormal spells.  Patient has had episodes of staring, convulsions, unresponsiveness since age 31 years old.  He was evaluated by neurology in 2018 by Dr Delice Lesch, and diagnosed with nonepileptic spells.  He had [name] monitoring and EEGs which confirmed this diagnosis.  He was dismissed from that practice due to behavior and then established with another neurologist Dr. Doy Hutching.  She also confirmed the diagnosis of psychogenic nonepileptic seizures and recommended follow-up with psychiatry.  Patient followed up with PCP and he requested third opinion neurology consult with [place] today.  Patient continues to have similar spells.  He is here alone.  He is not sure what medicines he is tried in the past.  Urgently I was able to pull up his records in epic system although his medical records are listed under 2 medical record numbers.  He states he is not seeing psychiatry currently.   (Copied from note 01/18/20, Dr. Doy Hutching):  The patient is a72 y.o.right handedBlack or African AmericanMaleWith history of generalized seizures, probablymost if not all arepseudoseizures.No tongue biting/ incontinence, not in sleep, may be brought on by stress.Can't take Keppra/depakote/lamictal/tegretol. Has been noncompliant with sz meds. Seeing Psychiatry ([facility] in [place]). Memoryloss,not severe. Head CT was normal3years ago.  Saw video of  spell, was unresponsive but no shaking or posturing.Gets HAs/dizziness after spells. He has been to ER several times with syncope/seizure-like activity.  Has not had MRI. Migraines not bad, not frequent.   (Copied from note 04/18/17, Dr. Delice Lesch): HISTORY OF PRESENT ILLNESS I had the pleasure of seeing Franklin [name] in follow-up in the neurology clinic on 04/19/2017. 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 He is accompanied by his wife who helps supplement the history today. Since his last visit, there have been 5 ER visits for seizures. He was in the ER on 11/7, then again on 11/8 for back to back seizures. It was noted that his head and eyes turned to the left, then with administration of ammonia, patient jerked his head to the right and blinked and briefly moved all 4 extremities. On removal of ammonia, he returns his gaze to the left with frequent blinking and tearing. His wife reports that the seizures she has witnessed consists of patient blanking out, he does not talk, then he "starts shaking all over the place." Sometimes his eyes are closed. If they are open, he is just dazed out of it. He was monitored at [facility] for several hours, but he became progressively agitated and uncooperative. He did have 3 typical spells confirmed by his cousin as typical events. He was kicking his legs, unresponsive, staring off, flailing around the bed during one event. The second episode he was noted to have right hand shaking, trying to get out of bed, fidgeting around the bed. The third push button was for fighting the nurses and getting agitated, pulling his IV out. All three  episodes did not show any EEG correlate. It was noted that it is unclear whether all his spells are non-epileptic, however baseline uncontrolled psychosis made him unable to comply with [name] monitoring to continue testing. He now follows with [facility] for psychiatry and therapy, and is taking Prozac.  He is on Oxcarbazepine 150mg  BID for mood stabilization (he reported pain on 300mg  BID).  HPI: This is a 39 yo RH man with a history of bipolar disorder, mild cognitive impairment, and seizures since age 84 or 10. He and his cousin both report that seizures would start with staring and unresponsiveness, followed by low amplitude shaking of both hands. Sometimes he would have violent leg kicking. They state that shaking last from 30 minutes up to "1-1/2 hours" followed by confusion for around 20 minutes. He would be sleepy after a seizure. He has had urinary incontinence with some of them, no tongue bite or other significant injuries. He reports all seizures occur during wakefulness, no nocturnal seizures that he is aware of, but woke up one time with urinary incontinence. He denies any olfactory/gustatory hallucinations, deja vu, rising epigastric sensation, focal numbness/tingling/weakness, myoclonic jerks. He occasionally drops things from his hands. He reports that Depakote was started at age 52, however he has only been taking 250mg /day, stating that when he was on 500 or 750mg , he would be "like a zombie, walking into doors." The low dose Depakote has not controlled the seizures, he reports seizures occurring every 3-4 days, longest seizure-free interval probably 1-2 months.   On review of records on EPIC, he has been to the ER several times since 2008 with no note of a seizure history until 2014. There are some records of patient being on Depakote, Tegretol, and Haldol, presumably for Bipolar disorder. He reports chronic headaches occurring 2-3 times a week with sharp pain in the back of his head, "like a needle sticking in it" lasting 30-45 minutes. He would feel lightheaded. No associated nausea, vomiting, photo/phonophobia, or visual obscurations. He takes Tylenol or aspirin with good effect. He lives with his girlfriend. He finished 12th grade in special education classes. He does not  drive.  Epilepsy Risk Factors: His maternal uncle used to have seizures. He was born premature, with mild cognitive impairment. Otherwise he denies any history of febrile convulsions, CNS infections such as meningitis/encephalitis, significant traumatic brain injury, neurosurgical procedures.   REVIEW OF SYSTEMS: Full 14 system review of systems performed and negative with exception of: as per HPI.   ALLERGIES: Allergies  Allergen Reactions  . Penicillins     HOME MEDICATIONS: Outpatient Medications Prior to Visit  Medication Sig Dispense Refill  . atorvastatin (LIPITOR) 10 MG tablet Take 10 mg by mouth daily.    Marland Kitchen lamoTRIgine (LAMICTAL) 25 MG tablet Take 25 mg by mouth 2 (two) times daily.    . QUEtiapine (SEROQUEL XR) 50 MG TB24 24 hr tablet Take 100 mg by mouth at bedtime.     No facility-administered medications prior to visit.    PAST MEDICAL HISTORY: Past Medical History:  Diagnosis Date  . Anxiety   . Bipolar 1 disorder ([place])   . Dizziness   . Insomnia   . Schizophrenia ([place])   . Seizure-like activity ([place])   . Seizures ([place])     PAST SURGICAL HISTORY: History reviewed. No pertinent surgical history.  FAMILY HISTORY: Family History  Problem Relation Age of Onset  . Kidney failure Mother     SOCIAL HISTORY: Social History   Socioeconomic History  .  Marital status: Married    Spouse name: Angie  . Number of children: 0  . Years of education: Not on file  . Highest education level: Not on file  Occupational History    Comment: disability  Tobacco Use  . Smoking status: Never Smoker  . Smokeless tobacco: Never Used  Substance and Sexual Activity  . Alcohol use: Yes    Comment: special occassions  . Drug use: Never  . Sexual activity: Not on file  Other Topics Concern  . Not on file  Social History Narrative   Lives with wife   Sodas, 5 day   Social Determinants of Health   Financial Resource Strain:   . Difficulty of Paying Living  Expenses: Not on file  Food Insecurity:   . Worried About Charity fundraiser in the Last Year: Not on file  . Ran Out of Food in the Last Year: Not on file  Transportation Needs:   . Lack of Transportation (Medical): Not on file  . Lack of Transportation (Non-Medical): Not on file  Physical Activity:   . Days of Exercise per Week: Not on file  . Minutes of Exercise per Session: Not on file  Stress:   . Feeling of Stress : Not on file  Social Connections:   . Frequency of Communication with Friends and Family: Not on file  . Frequency of Social Gatherings with Friends and Family: Not on file  . Attends Religious Services: Not on file  . Active Member of Clubs or Organizations: Not on file  . Attends Archivist Meetings: Not on file  . Marital Status: Not on file  Intimate Partner Violence:   . Fear of Current or Ex-Partner: Not on file  . Emotionally Abused: Not on file  . Physically Abused: Not on file  . Sexually Abused: Not on file     PHYSICAL EXAM  GENERAL EXAM/CONSTITUTIONAL: Vitals:  Vitals:   04/01/20 1429  BP: 119/78  Pulse: (!) 58  Weight: 164 lb (74.4 kg)  Height: 6' (1.829 m)     Body mass index is 22.24 kg/m. Wt Readings from Last 3 Encounters:  04/01/20 164 lb (74.4 kg)     Patient is in no distress; well developed, nourished and groomed; neck is supple  CARDIOVASCULAR:  Examination of carotid arteries is normal; no carotid bruits  Regular rate and rhythm, no murmurs  Examination of peripheral vascular system by observation and palpation is normal  EYES:  Ophthalmoscopic exam of optic discs and posterior segments is normal; no papilledema or hemorrhages  No exam data present  MUSCULOSKELETAL:  Gait, strength, tone, movements noted in Neurologic exam below  NEUROLOGIC: MENTAL STATUS:  No flowsheet data found.  awake, alert, oriented to person, place and time  recent and remote memory intact  normal attention and  concentration  language fluent, comprehension intact, naming intact  fund of knowledge appropriate  CRANIAL NERVE:   2nd - no papilledema on fundoscopic exam  2nd, 3rd, 4th, 6th - pupils equal and reactive to light, visual fields full to confrontation, extraocular muscles intact, no nystagmus  5th - facial sensation symmetric  7th - facial strength symmetric  8th - hearing intact  9th - palate elevates symmetrically, uvula midline  11th - shoulder shrug symmetric  12th - tongue protrusion midline  MOTOR:   normal bulk and tone, full strength in the BUE, BLE  SENSORY:   normal and symmetric to light touch, temperature, vibration  COORDINATION:  finger-nose-finger, fine finger movements normal  REFLEXES:   deep tendon reflexes present and symmetric  GAIT/STATION:   narrow based gait     DIAGNOSTIC DATA (LABS, IMAGING, TESTING) - I reviewed patient records, labs, notes, testing and imaging myself where available.  No results found for: WBC, HGB, HCT, MCV, PLT No results found for: NA, K, CL, CO2, GLUCOSE, BUN, CREATININE, CALCIUM, PROT, ALBUMIN, AST, ALT, ALKPHOS, BILITOT, GFRNONAA, GFRAA No results found for: CHOL, HDL, LDLCALC, LDLDIRECT, TRIG, CHOLHDL No results found for: HGBA1C No results found for: VITAMINB12 No results found for: TSH  01/06/15 EEG - A normal EEG does not exclude a clinical diagnosis of epilepsy.  If further clinical questions remain, prolonged EEG may be helpful.  Clinical correlation is advised.  03/07/19 EEG - Probably normal 24 hr Ambulatory EEG in waking, drowsiness and sleep performed with ST leads. - Two of 5 shaking spells showed some rhythmic slowing and small spike-like waves which are probably artifactual. No definite seizures. - Total recording time: 24 hrs, 26 minutes.  03/18/16 CT head  - unremarkable   ASSESSMENT AND PLAN  39 y.o. year old male here with:    Dx:  1. Psychogenic nonepileptic seizure      PLAN:  NON-EPILEPTIC SPELLS / PSEUDOSEIZURES (since age 23 years old) - diagnosis has been established by Dr. Delice Lesch [facility]) and Dr. Doy Hutching [facility] neurology) in the past - follow up with PCP and psychiatry  Return for return to PCP.    Penni Bombard, MD 53/07/231, 4:35 PM Certified in Neurology, Neurophysiology and Neuroimaging  [facility] [address], [place], [place] [address] [phone]

## 2020-04-01 NOTE — Patient Instructions (Signed)
NON-EPILEPTIC SPELLS / PSEUDOSEIZURES - diagnosis has been established by Dr. Delice Lesch [facility] Neurology) and Dr. Doy Hutching [facility] neurology) in the past - follow up with PCP and psychiatry

## 2020-04-03 ENCOUNTER — Encounter (HOSPITAL_COMMUNITY): Payer: Self-pay | Admitting: Emergency Medicine

## 2020-04-07 ENCOUNTER — Emergency Department (HOSPITAL_COMMUNITY)
Admission: EM | Admit: 2020-04-07 | Discharge: 2020-04-07 | Discharge status: Against Medical Advice | Payer: Medicare Other

## 2020-04-07 NOTE — ED Notes (Signed)
Called for triage no response

## 2020-04-07 NOTE — ED Notes (Signed)
Called for triage, no response.

## 2020-04-07 NOTE — ED Notes (Signed)
Called for triage. No response. 

## 2020-04-09 ENCOUNTER — Other Ambulatory Visit: Payer: Self-pay

## 2020-04-09 ENCOUNTER — Emergency Department (HOSPITAL_COMMUNITY)
Admission: EM | Admit: 2020-04-09 | Discharge: 2020-04-09 | Disposition: A | Discharge status: Home / Self Care | Payer: Medicare Other | Attending: Emergency Medicine | Admitting: Emergency Medicine

## 2020-04-09 ENCOUNTER — Emergency Department (HOSPITAL_COMMUNITY): Payer: Medicare Other

## 2020-04-09 ENCOUNTER — Encounter (HOSPITAL_COMMUNITY): Payer: Self-pay | Admitting: Radiology

## 2020-04-09 DIAGNOSIS — Z91040 Latex allergy status: Secondary | ICD-10-CM | POA: Diagnosis not present

## 2020-04-09 DIAGNOSIS — R1031 Right lower quadrant pain: Secondary | ICD-10-CM | POA: Insufficient documentation

## 2020-04-09 DIAGNOSIS — R112 Nausea with vomiting, unspecified: Secondary | ICD-10-CM | POA: Diagnosis not present

## 2020-04-09 DIAGNOSIS — Z79899 Other long term (current) drug therapy: Secondary | ICD-10-CM | POA: Diagnosis not present

## 2020-04-09 DIAGNOSIS — R1111 Vomiting without nausea: Secondary | ICD-10-CM

## 2020-04-09 DIAGNOSIS — R1032 Left lower quadrant pain: Secondary | ICD-10-CM | POA: Diagnosis present

## 2020-04-09 LAB — CBC WITH DIFFERENTIAL/PLATELET
Abs Immature Granulocytes: 0 10*3/uL (ref 0.00–0.07)
Basophils Absolute: 0 10*3/uL (ref 0.0–0.1)
Basophils Relative: 1 %
Eosinophils Absolute: 0 10*3/uL (ref 0.0–0.5)
Eosinophils Relative: 1 %
HCT: 46 % (ref 39.0–52.0)
Hemoglobin: 15.6 g/dL (ref 13.0–17.0)
Immature Granulocytes: 0 %
Lymphocytes Relative: 33 %
Lymphs Abs: 1.4 10*3/uL (ref 0.7–4.0)
MCH: 30.7 pg (ref 26.0–34.0)
MCHC: 33.9 g/dL (ref 30.0–36.0)
MCV: 90.6 fL (ref 80.0–100.0)
Monocytes Absolute: 0.3 10*3/uL (ref 0.1–1.0)
Monocytes Relative: 8 %
Neutro Abs: 2.5 10*3/uL (ref 1.7–7.7)
Neutrophils Relative %: 57 %
Platelets: 172 10*3/uL (ref 150–400)
RBC: 5.08 MIL/uL (ref 4.22–5.81)
RDW: 12.1 % (ref 11.5–15.5)
WBC: 4.4 10*3/uL (ref 4.0–10.5)
nRBC: 0 % (ref 0.0–0.2)

## 2020-04-09 LAB — BASIC METABOLIC PANEL
Anion gap: 9 (ref 5–15)
BUN: 15 mg/dL (ref 6–20)
CO2: 27 mmol/L (ref 22–32)
Calcium: 9.8 mg/dL (ref 8.9–10.3)
Chloride: 104 mmol/L (ref 98–111)
Creatinine, Ser: 0.89 mg/dL (ref 0.61–1.24)
GFR, Estimated: >60 mL/min (ref >60)
Glucose, Bld: 86 mg/dL (ref 70–99)
Potassium: 4.8 mmol/L (ref 3.5–5.1)
Sodium: 140 mmol/L (ref 135–145)

## 2020-04-09 LAB — HEPATIC FUNCTION PANEL
ALT: 15 U/L (ref 0–44)
AST: 17 U/L (ref 15–41)
Albumin: 4.7 g/dL (ref 3.5–5.0)
Alkaline Phosphatase: 54 U/L (ref 38–126)
Bilirubin, Direct: 0.1 mg/dL (ref 0.0–0.2)
Indirect Bilirubin: 0.9 mg/dL (ref 0.3–0.9)
Total Bilirubin: 1 mg/dL (ref 0.3–1.2)
Total Protein: 8.1 g/dL (ref 6.5–8.1)

## 2020-04-09 LAB — URINALYSIS, ROUTINE W REFLEX MICROSCOPIC
Bilirubin Urine: NEGATIVE
Glucose, UA: NEGATIVE mg/dL
Hgb urine dipstick: NEGATIVE
Ketones, ur: NEGATIVE mg/dL
Leukocytes,Ua: NEGATIVE
Nitrite: NEGATIVE
Protein, ur: NEGATIVE mg/dL
Specific Gravity, Urine: 1.009 (ref 1.005–1.030)
pH: 8 (ref 5.0–8.0)

## 2020-04-09 LAB — LIPASE, BLOOD: Lipase: 35 U/L (ref 11–51)

## 2020-04-09 MED ORDER — SODIUM CHLORIDE 0.9 % IV BOLUS
1000.0000 mL | Freq: Once | INTRAVENOUS | Status: AC
  Administered 2020-04-09: 1000 mL via INTRAVENOUS

## 2020-04-09 MED ORDER — ONDANSETRON HCL 4 MG PO TABS: 4.0000 mg | ORAL_TABLET | Freq: Three times a day (TID) | ORAL | 0 refills | Status: DC | PRN

## 2020-04-09 MED ORDER — IOHEXOL 300 MG/ML  SOLN
100.0000 mL | Freq: Once | INTRAMUSCULAR | Status: AC | PRN
  Administered 2020-04-09: 100 mL via INTRAVENOUS

## 2020-04-09 MED ORDER — ONDANSETRON HCL 4 MG/2ML IJ SOLN
4.0000 mg | Freq: Once | INTRAMUSCULAR | Status: AC
  Administered 2020-04-09: 4 mg via INTRAVENOUS
  Filled 2020-04-09: qty 2

## 2020-04-09 NOTE — ED Triage Notes (Signed)
Pt BIB EMS. Pt reports vomiting since yesterday. Pt stated he ate breakfast and was unable to keep it down. VSS.

## 2020-04-09 NOTE — ED Notes (Signed)
Patient given urinal and informed urine sample is needed.

## 2020-04-09 NOTE — ED Provider Notes (Signed)
[facility] DEPT Provider Note   CSN: [id number] Arrival date & time: 04/09/20  1015     History Chief Complaint  Patient presents with  . Emesis    Franklin [name] is a 39 y.o. male.  The history is provided by the patient.  Abdominal Pain Pain location:  LLQ and RLQ Pain radiates to:  Does not radiate Pain severity:  Mild Onset quality:  Gradual Duration:  2 days Timing:  Constant Progression:  Worsening Chronicity:  New Context: not suspicious food intake   Relieved by:  Nothing Worsened by:  Nothing Associated symptoms: nausea and vomiting   Associated symptoms: no chest pain, no chills, no cough, no dysuria, no fever, no hematuria, no shortness of breath and no sore throat   Risk factors: has not had multiple surgeries        Past Medical History:  Diagnosis Date  . Anxiety   . Bipolar 1 disorder ([place])   . Chronic headaches   . Conversion disorder with seizures or convulsions   . Convulsion, non-epileptic [facility])    "raises the possibility of non-epileptic events" per Neuro MD office note 03/2015  . Depression   . Dizziness   . Hx of electroencephalogram 12/2014   normal  . Insomnia   . Mild cognitive impairment   . Psychogenic nonepileptic seizure   . Schizophrenia ([place])   . Schizophrenic disorder ([place])   . Seizure-like activity ([place])   . Seizures [facility])     Patient Active Problem List   Diagnosis Date Noted  . Migraine without aura and without status migrainosus, not intractable 05/22/2018  . Vertigo 05/22/2018  . Psychogenic nonepileptic seizure 02/18/2017  . Cholelithiasis 01/24/2017  . 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 Spells of decreased attentiveness 03/16/2016  . Abnormal EKG 11/30/2015  . Elevated troponin 11/30/2015  . Bipolar affective disorder, most recent episode unspecified type, remission status unspecified 03/11/2015  . Bipolar affective disorder ([place]) 01/08/2015  .  Generalized idiopathic epilepsy and epileptic syndromes, without status epilepticus, not intractable ([place]) 11/27/2014  . Seizure disorder ([place]) 10/14/2014    No past surgical history on file.     Family History  Problem Relation Age of Onset  . Kidney failure Mother   . Heart disease Mother     Social History   Tobacco Use  . Smoking status: Never Smoker  . Smokeless tobacco: Never Used  Vaping Use  . Vaping Use: Never used  Substance Use Topics  . Alcohol use: Yes    Comment: special occassions  . Drug use: Never    Home Medications Prior to Admission medications   Medication Sig Start Date End Date Taking? Authorizing Provider  albuterol (PROAIR HFA) 108 (90 Base) MCG/ACT inhaler Inhale 1-2 puffs into the lungs every 6 (six) hours as needed for wheezing or shortness of breath. 01/02/20   Zigmund Gottron, NP  atorvastatin (LIPITOR) 10 MG tablet Take 10 mg by mouth daily. 02/26/20   [provider]  famotidine (PEPCID) 20 MG tablet Take 1 tablet (20 mg total) by mouth 2 (two) times daily for 7 days. 12/29/19 01/05/20  Darr, Edison Nasuti, PA-C  lamoTRIgine (LAMICTAL) 25 MG tablet Take 25 mg by mouth 2 (two) times daily. 03/20/20   [provider]  naproxen (NAPROSYN) 500 MG tablet Take 1 tablet (500 mg total) by mouth 2 (two) times daily with a meal. 03/04/20   Jaynee Eagles, PA-C  ondansetron (ZOFRAN) 4 MG tablet Take 1  tablet (4 mg total) by mouth every 8 (eight) hours as needed for up to 15 doses for nausea or vomiting. 04/09/20   Teckla Christiansen, DO  QUEtiapine (SEROQUEL XR) 50 MG TB24 24 hr tablet Take 100 mg by mouth at bedtime. 12/28/19   [provider]  QUEtiapine (SEROQUEL) 50 MG tablet Take 50 mg by mouth at bedtime.    [provider]  sulfamethoxazole-trimethoprim (BACTRIM DS) 800-160 MG tablet Take 1 tablet by mouth 2 (two) times daily. 03/04/20   Jaynee Eagles, PA-C  traMADol (ULTRAM) 50 MG tablet Take 1 tablet (50 mg total) by mouth every 6 (six)  hours as needed. 03/09/20   Khatri, Hina, PA-C  diphenhydrAMINE (BENADRYL) 25 MG tablet Take 2 tablets (50 mg total) by mouth at bedtime as needed for up to 7 days. 12/29/19 02/15/20  Darr, Edison Nasuti, PA-C  loratadine (CLARITIN) 10 MG tablet Take 1 tablet (10 mg total) by mouth daily. Patient not taking: Reported on 10/11/2019 09/18/19 12/02/19  Charlott Rakes, MD  Oxcarbazepine (TRILEPTAL) 300 MG tablet Take 1/2 tablet twice a day Patient not taking: Reported on 07/08/2019 01/26/18 07/09/19  Argentina Donovan, PA-C    Allergies    Coconut oil, Other, Codeine, Depakote [divalproex sodium], Hydroxyzine, Penicillins, Tape, Keflex [cephalexin], Penicillins, Latex, and Levetiracetam  Review of Systems   Review of Systems  Constitutional: Negative for chills and fever.  HENT: Negative for ear pain and sore throat.   Eyes: Negative for pain and visual disturbance.  Respiratory: Negative for cough and shortness of breath.   Cardiovascular: Negative for chest pain and palpitations.  Gastrointestinal: Positive for abdominal pain, nausea and vomiting.  Genitourinary: Negative for dysuria and hematuria.  Musculoskeletal: Negative for arthralgias and back pain.  Skin: Negative for color change and rash.  Neurological: Negative for seizures and syncope.  All other systems reviewed and are negative.   Physical Exam Updated Vital Signs  ED Triage Vitals  Enc Vitals Group     BP 04/09/20 1025 104/77     Pulse Rate 04/09/20 1025 (!) 59     Resp 04/09/20 1025 17     Temp 04/09/20 1025 98.4 F (36.9 C)     Temp Source 04/09/20 1025 Oral     SpO2 04/09/20 1025 96 %     Weight 04/09/20 1037 155 lb (70.3 kg)     Height 04/09/20 1037 6' (1.829 m)     Head Circumference --      Peak Flow --      Pain Score --      Pain Loc --      Pain Edu? --      Excl. in [place]? --     Physical Exam Vitals and nursing note reviewed.  Constitutional:      General: He is not in acute distress.    Appearance: He is  well-developed. He is not ill-appearing.  HENT:     Head: Normocephalic and atraumatic.     Nose: Nose normal.     Mouth/Throat:     Mouth: Mucous membranes are moist.  Eyes:     Extraocular Movements: Extraocular movements intact.     Conjunctiva/sclera: Conjunctivae normal.     Pupils: Pupils are equal, round, and reactive to light.  Cardiovascular:     Rate and Rhythm: Normal rate and regular rhythm.     Heart sounds: No murmur heard.   Pulmonary:     Effort: Pulmonary effort is normal. No respiratory distress.  Breath sounds: Normal breath sounds.  Abdominal:     Palpations: Abdomen is soft.     Tenderness: There is abdominal tenderness (LLQ/RLQ). There is rebound.  Musculoskeletal:     Cervical back: Neck supple.  Skin:    General: Skin is warm and dry.     Capillary Refill: Capillary refill takes less than 2 seconds.  Neurological:     General: No focal deficit present.     Mental Status: He is alert.     ED Results / Procedures / Treatments   Labs (all labs ordered are listed, but only abnormal results are displayed) Labs Reviewed  URINALYSIS, ROUTINE W REFLEX MICROSCOPIC - Abnormal; Notable for the following components:      Result Value   Color, Urine STRAW (*)    All other components within normal limits  CBC WITH DIFFERENTIAL/PLATELET  BASIC METABOLIC PANEL  HEPATIC FUNCTION PANEL  LIPASE, BLOOD    EKG None  Radiology CT ABDOMEN PELVIS W CONTRAST  Result Date: 04/09/2020 CLINICAL DATA:  Nausea and vomiting for 2 days, lower abdominal pain, question diverticulitis EXAM: CT ABDOMEN AND PELVIS WITH CONTRAST TECHNIQUE: Multidetector CT imaging of the abdomen and pelvis was performed using the standard protocol following bolus administration of intravenous contrast. Sagittal and coronal MPR images reconstructed from axial data set. CONTRAST:  137mL OMNIPAQUE IOHEXOL 300 MG/ML SOLN IV. No oral contrast. COMPARISON:  09/16/2019 FINDINGS: Lower chest: Lung  bases clear Hepatobiliary: Calcified gallstone 5 mm diameter within gallbladder. Gallbladder and liver otherwise normal appearance. Pancreas: Normal appearance Spleen: Normal appearance Adrenals/Urinary Tract: Adrenal glands, kidneys, ureters, and bladder normal appearance Stomach/Bowel: Low lying cecum in pelvis, new. Appendix not visualized. Stomach unremarkable. Large and small bowel loops normal appearance. No colonic diverticula noted. Vascular/Lymphatic: Vascular structures patent. Aorta normal caliber. No adenopathy. Reproductive: Prostate gland and seminal vesicles unremarkable Other: No free air or free fluid. No hernia or inflammatory process. Musculoskeletal: LEFT paracentral disc herniation L4-L5 indenting thecal sac, question exerting mass effect upon the LEFT L5 root. No osseous findings. IMPRESSION: Cholelithiasis. LEFT paracentral disc herniation L4-L5 indenting thecal sac, question exerting mass effect upon the LEFT L5 root. No other intra-abdominal or intrapelvic abnormalities identified. Electronically Signed   By: Lavonia Dana M.D.   On: 04/09/2020 14:51    Procedures Procedures (including critical care time)  Medications Ordered in ED Medications  sodium chloride 0.9 % bolus 1,000 mL (0 mLs Intravenous Stopped 04/09/20 1349)  ondansetron (ZOFRAN) injection 4 mg (4 mg Intravenous Given 04/09/20 1035)  iohexol (OMNIPAQUE) 300 MG/ML solution 100 mL (100 mLs Intravenous Contrast Given 04/09/20 1411)    ED Course  I have reviewed the triage vital signs and the nursing notes.  Pertinent labs & imaging results that were available during my care of the patient were reviewed by me and considered in my medical decision making (see chart for details).    MDM Rules/Calculators/A&P                          Franklin [name] is a 39 year old male with history of seizures who presents the ED with nausea, vomiting, abdominal pain.  Symptoms for the last 2 days.  No fever.  Pain mostly in the  lower quadrants on abdominal exam with some rebound.  No history of abdominal surgeries.  Could be food poisoning versus diverticulitis versus appendicitis versus less likely bowel obstruction.  Will get lab work including CT scan of abdomen and pelvis.  Will give fluid bolus, antiemetics.  Lab work unremarkable.  No urinary tract infection.  No diverticulitis or kidney stones and overall unremarkable CT scan abdomen and pelvis.  Patient felt better after IV fluids and antinausea meds.  Overall suspect may be foodborne process/viral process.  Discharged in good condition.  Given return precautions.  This chart was dictated using voice recognition software.  Despite best efforts to proofread,  errors can occur which can change the documentation meaning.  Final Clinical Impression(s) / ED Diagnoses Final diagnoses:  Vomiting without nausea, intractability of vomiting not specified, unspecified vomiting type    Rx / DC Orders ED Discharge Orders         Ordered    ondansetron (ZOFRAN) 4 MG tablet  Every 8 hours PRN        04/09/20 1458           Lennice Sites, DO 04/09/20 1459

## 2020-04-23 ENCOUNTER — Ambulatory Visit: Payer: Medicare Other | Admitting: Diagnostic Neuroimaging

## 2020-04-28 ENCOUNTER — Encounter (HOSPITAL_COMMUNITY): Payer: Self-pay

## 2020-04-28 ENCOUNTER — Emergency Department (HOSPITAL_COMMUNITY)
Admission: EM | Admit: 2020-04-28 | Discharge: 2020-04-28 | Disposition: A | Discharge status: Home / Self Care | Payer: Medicare Other | Attending: Emergency Medicine | Admitting: Emergency Medicine

## 2020-04-28 ENCOUNTER — Emergency Department (HOSPITAL_COMMUNITY): Payer: Medicare Other

## 2020-04-28 DIAGNOSIS — X501XXA Overexertion from prolonged static or awkward postures, initial encounter: Secondary | ICD-10-CM | POA: Insufficient documentation

## 2020-04-28 DIAGNOSIS — Z91040 Latex allergy status: Secondary | ICD-10-CM | POA: Insufficient documentation

## 2020-04-28 DIAGNOSIS — Z79899 Other long term (current) drug therapy: Secondary | ICD-10-CM | POA: Diagnosis not present

## 2020-04-28 DIAGNOSIS — S838X1A Sprain of other specified parts of right knee, initial encounter: Secondary | ICD-10-CM | POA: Diagnosis not present

## 2020-04-28 DIAGNOSIS — S8992XA Unspecified injury of left lower leg, initial encounter: Secondary | ICD-10-CM | POA: Diagnosis present

## 2020-04-28 MED ORDER — METHOCARBAMOL 500 MG PO TABS: 500.0000 mg | ORAL_TABLET | Freq: Two times a day (BID) | ORAL | 0 refills | Status: DC

## 2020-04-28 MED ORDER — IBUPROFEN 600 MG PO TABS: 600.0000 mg | ORAL_TABLET | Freq: Four times a day (QID) | ORAL | 0 refills | Status: DC | PRN

## 2020-04-28 MED ORDER — HYDROCODONE-ACETAMINOPHEN 5-325 MG PO TABS
1.0000 | ORAL_TABLET | Freq: Once | ORAL | Status: AC
  Administered 2020-04-28: 1 via ORAL
  Filled 2020-04-28: qty 1

## 2020-04-28 MED ORDER — ACETAMINOPHEN 500 MG PO TABS: 500.0000 mg | ORAL_TABLET | Freq: Four times a day (QID) | ORAL | 0 refills | Status: DC | PRN

## 2020-04-28 NOTE — Discharge Instructions (Signed)
Weight-bear as tolerated with crutches. Follow-up with the orthopedist in 7 to 14 days. Keep the leg elevated and apply ice.

## 2020-04-28 NOTE — Progress Notes (Signed)
Orthopedic Tech Progress Note Patient Details:  CALEL PISARSKI 1981/02/18 [id number]  Ortho Devices Ortho Device/Splint Location: applied knee immobilizer to LLE Ortho Device/Splint Interventions: Ordered, Application   Post Interventions Patient Tolerated: Well Instructions Provided: Care of device   Braulio Bosch 04/28/2020, 4:07 PM

## 2020-04-28 NOTE — Progress Notes (Signed)
Orthopedic Tech Progress Note Patient Details:  Franklin [name] 1980-11-18 [id number]  Ortho Devices Ortho Device/Splint Location: crutches Ortho Device/Splint Interventions: Ordered, Adjustment   Post Interventions Patient Tolerated: Well Instructions Provided: Care of device   Braulio Bosch 04/28/2020, 2:46 PM

## 2020-04-28 NOTE — ED Triage Notes (Addendum)
Patient arrived via [facility] from home. (Lives with his wife and has a Optician, dispensing that sees him 3x a week)  C/O Left leg pain (generalized) Pt denies falls. Patient states he was in the kitchen and tried to pick something up and left leg started hurting. Patient denies hearing a pop or tear.   Patient uses a walker on a daily basis and was using it today.   Hx: Anxiety, Bipolar II disorder, Schizoaffective disorder, and seizures per EMS.  A/O @ baseline per ems.   Patient calm at this time (more than usual per ems).   BP-146/92 p-72 98.1 temp 98% RA RR-16

## 2020-04-28 NOTE — ED Provider Notes (Signed)
F/U x ray, if negative can d/c with crutches, knee immobilizer ortho f/u Physical Exam  BP 138/80 (BP Location: Right Arm)   Pulse 64   Temp 98.6 F (37 C) (Oral)   Resp 16   Ht 6' (1.829 m)   Wt 70.3 kg   SpO2 100%   BMI 21.02 kg/m   Physical Exam  ED Course/Procedures     Procedures  MDM  Xray negative. Patient has immobilizer on and crutches       Charlesetta Shanks, MD 04/28/20 1650

## 2020-04-29 ENCOUNTER — Emergency Department (HOSPITAL_COMMUNITY): Payer: Medicare Other

## 2020-04-29 ENCOUNTER — Emergency Department (HOSPITAL_COMMUNITY)
Admission: EM | Admit: 2020-04-29 | Discharge: 2020-04-29 | Disposition: A | Discharge status: Home / Self Care | Payer: Medicare Other | Attending: Emergency Medicine | Admitting: Emergency Medicine

## 2020-04-29 ENCOUNTER — Other Ambulatory Visit: Payer: Self-pay

## 2020-04-29 ENCOUNTER — Encounter (HOSPITAL_COMMUNITY): Payer: Self-pay

## 2020-04-29 DIAGNOSIS — W19XXXA Unspecified fall, initial encounter: Secondary | ICD-10-CM | POA: Diagnosis not present

## 2020-04-29 DIAGNOSIS — R0781 Pleurodynia: Secondary | ICD-10-CM | POA: Diagnosis present

## 2020-04-29 MED ORDER — ACETAMINOPHEN 500 MG PO TABS
1000.0000 mg | ORAL_TABLET | Freq: Once | ORAL | Status: AC
  Administered 2020-04-29: 1000 mg via ORAL
  Filled 2020-04-29: qty 2

## 2020-04-29 MED ORDER — ONDANSETRON 4 MG PO TBDP: 4.0000 mg | ORAL_TABLET | Freq: Once | ORAL | Status: DC

## 2020-04-29 NOTE — ED Notes (Signed)
Pt transported to Xray. 

## 2020-04-29 NOTE — ED Provider Notes (Signed)
[facility] DEPT Provider Note   CSN: [id number] Arrival date & time: 04/29/20  1534     History Chief Complaint  Patient presents with  . Fall    Franklin [name] is a 39 y.o. male.  HPI   Patient with significant medical history of anxiety, bipolar, schizophrenia presents to the emergency department with chief complaint of a mechanical fall that happened earlier today.  Patient states he was recently seen the emergency department on 11/29 for left-sided knee pain.  Imaging and exam look reassuring, he was placed in a splint and provide him with crutches.  Patient endorses that on  his way to his appointment today he was using his crutches and fell on his right side.  He denies hitting his head, losing conscious, is not anticoagulated. Patient  endorses some right-sided rib pain but denies shortness of breath, pleuritic chest pain abdominal pain, nausea, vomiting, diarrhea, dysuria.  Patient denies associated headaches, change in vision, paresthesias or weakness in the upper or lower extremities.  Patient has been taking his medications that he was given yesterday for his knee but without relief.  Patient denies headaches, fevers, chills, shortness of breath, chest pain, abdominal pain, nausea, vomiting, diarrhea, pedal edema.  Past Medical History:  Diagnosis Date  . Anxiety   . Bipolar 1 disorder ([place])   . Chronic headaches   . Conversion disorder with seizures or convulsions   . 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 Depression   . Dizziness   . Hx of electroencephalogram 12/2014   normal  . Insomnia   . Mild cognitive impairment   . Psychogenic nonepileptic seizure   . Schizophrenia ([place])   . Schizophrenic disorder ([place])   . Seizure-like activity ([place])   . Seizures [facility])     Patient Active Problem List   Diagnosis Date Noted  . Migraine without aura and without  status migrainosus, not intractable 05/22/2018  . Vertigo 05/22/2018  . Psychogenic nonepileptic seizure 02/18/2017  . Cholelithiasis 01/24/2017  . 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 Spells of decreased attentiveness 03/16/2016  . Abnormal EKG 11/30/2015  . Elevated troponin 11/30/2015  . Bipolar affective disorder, most recent episode unspecified type, remission status unspecified 03/11/2015  . Bipolar affective disorder ([place]) 01/08/2015  . Generalized idiopathic epilepsy and epileptic syndromes, without status epilepticus, not intractable ([place]) 11/27/2014  . Seizure disorder ([place]) 10/14/2014    History reviewed. No pertinent surgical history.     Family History  Problem Relation Age of Onset  . Kidney failure Mother   . Heart disease Mother     Social History   Tobacco Use  . Smoking status: Never Smoker  . Smokeless tobacco: Never Used  Vaping Use  . Vaping Use: Never used  Substance Use Topics  . Alcohol use: Yes    Comment: special occassions  . Drug use: Never    Home Medications Prior to Admission medications   Medication Sig Start Date End Date Taking? Authorizing Provider  acetaminophen (TYLENOL) 500 MG tablet Take 1 tablet (500 mg total) by mouth every 6 (six) hours as needed. 04/28/20   Varney Biles, MD  albuterol (PROAIR HFA) 108 (90 Base) MCG/ACT inhaler Inhale 1-2 puffs into the lungs every 6 (six) hours as needed for wheezing or shortness of breath. 01/02/20   Zigmund Gottron, NP  atorvastatin (LIPITOR) 10 MG tablet Take 10 mg by  mouth daily. 02/26/20   [provider]  famotidine (PEPCID) 20 MG tablet Take 1 tablet (20 mg total) by mouth 2 (two) times daily for 7 days. Patient taking differently: Take 20 mg by mouth 2 (two) times daily as needed for heartburn.  12/29/19 04/28/20  Darr, Edison Nasuti, PA-C  ibuprofen (ADVIL) 600 MG tablet Take 1 tablet (600 mg total) by mouth every 6 (six) hours as needed. 04/28/20   Varney Biles, MD  lamoTRIgine (LAMICTAL) 100 MG tablet Take 100 mg by mouth daily.    [provider]  lamoTRIgine (LAMICTAL) 25 MG tablet Take 25 mg by mouth 2 (two) times daily. 03/20/20   [provider]  Lumateperone Tosylate (CAPLYTA) 42 MG CAPS Take 42 mg by mouth daily.    [provider]  methocarbamol (ROBAXIN) 500 MG tablet Take 1 tablet (500 mg total) by mouth 2 (two) times daily. 04/28/20   Varney Biles, MD  naproxen (NAPROSYN) 500 MG tablet Take 1 tablet (500 mg total) by mouth 2 (two) times daily with a meal. Patient not taking: Reported on 04/28/2020 03/04/20   Jaynee Eagles, PA-C  ondansetron (ZOFRAN) 4 MG tablet Take 1 tablet (4 mg total) by mouth every 8 (eight) hours as needed for up to 15 doses for nausea or vomiting. 04/09/20   Curatolo, Adam, DO  sulfamethoxazole-trimethoprim (BACTRIM DS) 800-160 MG tablet Take 1 tablet by mouth 2 (two) times daily. Patient not taking: Reported on 04/28/2020 03/04/20   Jaynee Eagles, PA-C  traMADol (ULTRAM) 50 MG tablet Take 1 tablet (50 mg total) by mouth every 6 (six) hours as needed. Patient not taking: Reported on 04/28/2020 03/09/20   Delia Heady, PA-C  diphenhydrAMINE (BENADRYL) 25 MG tablet Take 2 tablets (50 mg total) by mouth at bedtime as needed for up to 7 days. 12/29/19 02/15/20  Darr, Edison Nasuti, PA-C  loratadine (CLARITIN) 10 MG tablet Take 1 tablet (10 mg total) by mouth daily. Patient not taking: Reported on 10/11/2019 09/18/19 12/02/19  Charlott Rakes, MD  Oxcarbazepine (TRILEPTAL) 300 MG tablet Take 1/2 tablet twice a day Patient not taking: Reported on 07/08/2019 01/26/18 07/09/19  Argentina Donovan, PA-C    Allergies    Coconut oil, Other, Codeine, Depakote [divalproex sodium], Hydroxyzine, Penicillins, Tape, Keflex [cephalexin], Latex, and Levetiracetam  Review of Systems   Review of Systems  Constitutional: Negative for chills and fever.  HENT: Negative for congestion.   Respiratory: Negative for shortness of  breath.   Cardiovascular: Negative for chest pain.  Gastrointestinal: Negative for abdominal pain, diarrhea, nausea and vomiting.  Genitourinary: Negative for enuresis and flank pain.  Musculoskeletal: Negative for back pain.        Right-sided rib pain  Skin: Negative for rash.  Neurological: Negative for headaches.  Hematological: Does not bruise/bleed easily.    Physical Exam Updated Vital Signs BP 121/79 (BP Location: Left Arm)   Pulse 60   Temp 98.4 F (36.9 C) (Oral)   Resp 18   SpO2 99%   Physical Exam Vitals and nursing note reviewed.  Constitutional:      General: He is not in acute distress.    Appearance: He is not ill-appearing.  HENT:     Head: Normocephalic and atraumatic.     Nose: No congestion.  Eyes:     Conjunctiva/sclera: Conjunctivae normal.  Cardiovascular:     Rate and Rhythm: Normal rate and regular rhythm.     Pulses: Normal pulses.     Heart sounds: No murmur heard.  No friction rub. No gallop.   Pulmonary:     Effort: No respiratory distress.     Breath sounds: No wheezing, rhonchi or rales.  Abdominal:     Palpations: Abdomen is soft.     Tenderness: There is no guarding.  Musculoskeletal:     Cervical back: Normal range of motion.     Comments: Patient is moving all 4 extremities out difficulty.  Patient spine was palpated there is no step-off or gross deformities noted.  Patient's had noted tenderness along the sixth rib mid axillary on the right side.  There is no crepitus, deformities, flail chest noted during palpation or respiration.   Skin:    General: Skin is warm and dry.  Neurological:     Mental Status: He is alert.     Comments: Patient had no difficulty with word finding.  Psychiatric:        Mood and Affect: Mood normal.     ED Results / Procedures / Treatments   Labs (all labs ordered are listed, but only abnormal results are displayed) Labs Reviewed - No data to display  EKG None  Radiology DG Ribs  Unilateral W/Chest Right  Result Date: 04/29/2020 CLINICAL DATA:  [place] yesterday, right mid axillary rib pain EXAM: RIGHT RIBS AND CHEST - 3+ VIEW COMPARISON:  10/11/2019 FINDINGS: Frontal view of the chest as well as frontal and oblique views of the right thoracic cage are obtained. Cardiac silhouette is unremarkable. No airspace disease, effusion, or pneumothorax. There are no acute displaced rib fractures. IMPRESSION: 1. No acute intrathoracic process.  No acute fractures. Electronically Signed   By: Randa Ngo M.D.   On: 04/29/2020 16:46   DG Knee Complete 4 Views Left  Result Date: 04/28/2020 CLINICAL DATA:  Left knee pain without known injury. EXAM: LEFT KNEE - COMPLETE 4+ VIEW COMPARISON:  None. FINDINGS: No evidence of fracture, dislocation, or joint effusion. No evidence of arthropathy or other focal bone abnormality. Soft tissues are unremarkable. IMPRESSION: Negative. Electronically Signed   By: Marijo Conception M.D.   On: 04/28/2020 15:55    Procedures Procedures (including critical care time)  Medications Ordered in ED Medications  acetaminophen (TYLENOL) tablet 1,000 mg (1,000 mg Oral Given 04/29/20 1638)    ED Course  I have reviewed the triage vital signs and the nursing notes.  Pertinent labs & imaging results that were available during my care of the patient were reviewed by me and considered in my medical decision making (see chart for details).    MDM Rules/Calculators/A&P                          Patient presents after having mechanical fall with right-sided rib pain.  He was alert, does not appear in acute distress, vital signs reassuring.  Will order x-ray of right ribs for further evaluation.  Right-sided rib x-ray does not show any acute findings.   Low suspicion for fracture or dislocation as x-ray does not reveal any significant findings.  Low suspicion pneumothorax or an infectious etiology causing his rib pain as there is no acute findings seen on  x-ray.  Low suspicion for intra-abdominal abnormality requiring immediate intervention as abdomen was soft nontender to palpation, patient is tolerating p.o. without difficulty.  Low suspicion for intracranial head bleed as patient denies headaches, change in vision, denies paresthesia or weakness the upper lower extremities, no neuro deficits on exam.  Suspect patient suffering from a muscular  strain of his right ribs.  Will encourage the NSAID and muscle relaxer prescribed to him yesterday for his knee pain.  And have him follow-up with PCP for further evaluation.  Vital signs have remained stable, no indication for hospital admission.  Patient given at home care as well strict return precautions.  Patient verbalized that they understood agreed to said plan.  Final Clinical Impression(s) / ED Diagnoses Final diagnoses:  Fall, initial encounter  Rib pain on right side    Rx / DC Orders ED Discharge Orders    None       Marcello Fennel, PA-C 04/29/20 [place], DO 04/30/20 [phone]

## 2020-04-29 NOTE — Discharge Instructions (Signed)
You have been seen here for right sided rib pain. I recommend taking the medication that was given to yesterday (ibuprofen and Robaxin) as prescribed. I also recommend applying ice to the area to help decrease pain.   Recommend following up with your PCP in 2 weeks time if symptoms have not fully resolved.  Come back to the emergency department if you develop chest pain, shortness of breath, severe abdominal pain, uncontrolled nausea, vomiting, diarrhea.

## 2020-04-29 NOTE — ED Triage Notes (Signed)
Pt BIB EMS from home. Pt reports attempting to use his crutches and falling on his side and now endorses right flank pain. Denies hitting his head, LOC, and taking blood thinners.

## 2020-05-12 NOTE — ED Provider Notes (Signed)
[facility] DEPT Provider Note   CSN: [id number] Arrival date & time: 04/28/20  1347     History Chief Complaint  Patient presents with  . Leg Pain    Left    Franklin [name] is a 39 y.o. male.  HPI    39 year old male comes in a chief complaint of knee pain. Patient arrives via EMS.  He reports that he was in the kitchen, when he suddenly turned to grab something and heard a pop in his left leg.  He immediately started having severe pain and has been unable to ambulate since the time of injury.  No history of injury to that knee in the past.  Patient denies any numbness, tingling  Past Medical History:  Diagnosis Date  . Anxiety   . Bipolar 1 disorder ([place])   . Chronic headaches   . Conversion disorder with seizures or convulsions   . 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 Depression   . Dizziness   . Hx of electroencephalogram 12/2014   normal  . Insomnia   . Mild cognitive impairment   . Psychogenic nonepileptic seizure   . Schizophrenia ([place])   . Schizophrenic disorder ([place])   . Seizure-like activity ([place])   . Seizures [facility])     Patient Active Problem List   Diagnosis Date Noted  . Migraine without aura and without status migrainosus, not intractable 05/22/2018  . Vertigo 05/22/2018  . Psychogenic nonepileptic seizure 02/18/2017  . Cholelithiasis 01/24/2017  . 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 Spells of decreased attentiveness 03/16/2016  . Abnormal EKG 11/30/2015  . Elevated troponin 11/30/2015  . Bipolar affective disorder, most recent episode unspecified type, remission status unspecified 03/11/2015  . Bipolar affective disorder ([place]) 01/08/2015  . Generalized idiopathic epilepsy and epileptic syndromes, without status epilepticus, not intractable ([place]) 11/27/2014  . Seizure disorder ([place]) 10/14/2014    History  reviewed. No pertinent surgical history.     Family History  Problem Relation Age of Onset  . Kidney failure Mother   . Heart disease Mother     Social History   Tobacco Use  . Smoking status: Never Smoker  . Smokeless tobacco: Never Used  Vaping Use  . Vaping Use: Never used  Substance Use Topics  . Alcohol use: Yes    Comment: special occassions  . Drug use: Never    Home Medications Prior to Admission medications   Medication Sig Start Date End Date Taking? Authorizing Provider  albuterol (PROAIR HFA) 108 (90 Base) MCG/ACT inhaler Inhale 1-2 puffs into the lungs every 6 (six) hours as needed for wheezing or shortness of breath. 01/02/20  Yes [place], Lanelle Bal B, NP  atorvastatin (LIPITOR) 10 MG tablet Take 10 mg by mouth daily. 02/26/20  Yes [provider]  famotidine (PEPCID) 20 MG tablet Take 1 tablet (20 mg total) by mouth 2 (two) times daily for 7 days. Patient taking differently: Take 20 mg by mouth 2 (two) times daily as needed for heartburn.  12/29/19 04/28/20 Yes Darr, Edison Nasuti, PA-C  lamoTRIgine (LAMICTAL) 25 MG tablet Take 25 mg by mouth 2 (two) times daily. 03/20/20  Yes [provider]  Lumateperone Tosylate (CAPLYTA) 42 MG CAPS Take 42 mg by mouth daily.   Yes [provider]  ondansetron (ZOFRAN) 4 MG tablet Take 1 tablet (4 mg total) by mouth every 8 (eight) hours as needed for  up to 15 doses for nausea or vomiting. 04/09/20  Yes Curatolo, Adam, DO  acetaminophen (TYLENOL) 500 MG tablet Take 1 tablet (500 mg total) by mouth every 6 (six) hours as needed. 04/28/20   Varney Biles, MD  ibuprofen (ADVIL) 600 MG tablet Take 1 tablet (600 mg total) by mouth every 6 (six) hours as needed. 04/28/20   Varney Biles, MD  lamoTRIgine (LAMICTAL) 100 MG tablet Take 100 mg by mouth daily.    [provider]  methocarbamol (ROBAXIN) 500 MG tablet Take 1 tablet (500 mg total) by mouth 2 (two) times daily. 04/28/20   Varney Biles, MD  naproxen  (NAPROSYN) 500 MG tablet Take 1 tablet (500 mg total) by mouth 2 (two) times daily with a meal. Patient not taking: Reported on 04/28/2020 03/04/20   Jaynee Eagles, PA-C  sulfamethoxazole-trimethoprim (BACTRIM DS) 800-160 MG tablet Take 1 tablet by mouth 2 (two) times daily. Patient not taking: Reported on 04/28/2020 03/04/20   Jaynee Eagles, PA-C  traMADol (ULTRAM) 50 MG tablet Take 1 tablet (50 mg total) by mouth every 6 (six) hours as needed. Patient not taking: Reported on 04/28/2020 03/09/20   Delia Heady, PA-C  diphenhydrAMINE (BENADRYL) 25 MG tablet Take 2 tablets (50 mg total) by mouth at bedtime as needed for up to 7 days. 12/29/19 02/15/20  Darr, Edison Nasuti, PA-C  loratadine (CLARITIN) 10 MG tablet Take 1 tablet (10 mg total) by mouth daily. Patient not taking: Reported on 10/11/2019 09/18/19 12/02/19  Charlott Rakes, MD  Oxcarbazepine (TRILEPTAL) 300 MG tablet Take 1/2 tablet twice a day Patient not taking: Reported on 07/08/2019 01/26/18 07/09/19  Argentina Donovan, PA-C    Allergies    Coconut oil, Other, Codeine, Depakote [divalproex sodium], Hydroxyzine, Penicillins, Tape, Keflex [cephalexin], Latex, and Levetiracetam  Review of Systems   Review of Systems  Constitutional: Positive for activity change.  Musculoskeletal: Positive for arthralgias.  Skin: Negative for wound.  Neurological: Negative for numbness.  Hematological: Does not bruise/bleed easily.    Physical Exam Updated Vital Signs BP 128/88   Pulse 66   Temp 98.6 F (37 C) (Oral)   Resp 16   Ht 6' (1.829 m)   Wt 70.3 kg   SpO2 100%   BMI 21.02 kg/m   Physical Exam Vitals and nursing note reviewed.  Constitutional:      Appearance: He is well-developed.  HENT:     Head: Atraumatic.  Cardiovascular:     Rate and Rhythm: Normal rate.  Pulmonary:     Effort: Pulmonary effort is normal.  Musculoskeletal:        General: Swelling and tenderness present. No deformity.     Cervical back: Neck supple.     Comments: Mild  edema over the left knee. Patient has tenderness to palpation of the knee diffusely.  He is able to flex the knee with significant discomfort  Skin:    General: Skin is warm.  Neurological:     Mental Status: He is alert and oriented to person, place, and time.     ED Results / Procedures / Treatments   Labs (all labs ordered are listed, but only abnormal results are displayed) Labs Reviewed - No data to display  EKG None  Radiology No results found.  Procedures Procedures (including critical care time)  Medications Ordered in ED Medications  HYDROcodone-acetaminophen (NORCO/VICODIN) 5-325 MG per tablet 1 tablet (1 tablet Oral Given 04/28/20 1527)    ED Course  I have reviewed the triage vital signs  and the nursing notes.  Pertinent labs & imaging results that were available during my care of the patient were reviewed by me and considered in my medical decision making (see chart for details).    MDM Rules/Calculators/A&P                          Patient was in a chief complaint of knee pain.  It appears that he likely has soft tissue injury based on negative x-ray and the history.  We will apply knee brace and advised orthopedic follow-up.  Crutches provided.  RICE recommended  Final Clinical Impression(s) / ED Diagnoses Final diagnoses:  Sprain of other ligament of right knee, initial encounter    Rx / DC Orders ED Discharge Orders         Ordered    ibuprofen (ADVIL) 600 MG tablet  Every 6 hours PRN        04/28/20 1546    acetaminophen (TYLENOL) 500 MG tablet  Every 6 hours PRN        04/28/20 1546    methocarbamol (ROBAXIN) 500 MG tablet  2 times daily        04/28/20 [place], Garyn Arlotta, MD 05/12/20 1118

## 2020-05-17 ENCOUNTER — Other Ambulatory Visit: Payer: Self-pay

## 2020-05-17 ENCOUNTER — Emergency Department (HOSPITAL_COMMUNITY): Payer: Medicare Other

## 2020-05-17 ENCOUNTER — Emergency Department (HOSPITAL_COMMUNITY)
Admission: EM | Admit: 2020-05-17 | Discharge: 2020-05-17 | Disposition: A | Discharge status: Home / Self Care | Payer: Medicare Other | Attending: Emergency Medicine | Admitting: Emergency Medicine

## 2020-05-17 DIAGNOSIS — Z79899 Other long term (current) drug therapy: Secondary | ICD-10-CM | POA: Diagnosis not present

## 2020-05-17 DIAGNOSIS — R0602 Shortness of breath: Secondary | ICD-10-CM | POA: Insufficient documentation

## 2020-05-17 DIAGNOSIS — R079 Chest pain, unspecified: Secondary | ICD-10-CM

## 2020-05-17 DIAGNOSIS — R200 Anesthesia of skin: Secondary | ICD-10-CM | POA: Diagnosis not present

## 2020-05-17 DIAGNOSIS — R064 Hyperventilation: Secondary | ICD-10-CM | POA: Insufficient documentation

## 2020-05-17 DIAGNOSIS — E162 Hypoglycemia, unspecified: Secondary | ICD-10-CM | POA: Insufficient documentation

## 2020-05-17 LAB — CBC WITH DIFFERENTIAL/PLATELET
Abs Immature Granulocytes: 0.03 10*3/uL (ref 0.00–0.07)
Basophils Absolute: 0 10*3/uL (ref 0.0–0.1)
Basophils Relative: 0 %
Eosinophils Absolute: 0 10*3/uL (ref 0.0–0.5)
Eosinophils Relative: 0 %
HCT: 40.3 % (ref 39.0–52.0)
Hemoglobin: 13.8 g/dL (ref 13.0–17.0)
Immature Granulocytes: 0 %
Lymphocytes Relative: 14 %
Lymphs Abs: 1.3 10*3/uL (ref 0.7–4.0)
MCH: 30.3 pg (ref 26.0–34.0)
MCHC: 34.2 g/dL (ref 30.0–36.0)
MCV: 88.4 fL (ref 80.0–100.0)
Monocytes Absolute: 0.7 10*3/uL (ref 0.1–1.0)
Monocytes Relative: 8 %
Neutro Abs: 7 10*3/uL (ref 1.7–7.7)
Neutrophils Relative %: 78 %
Platelets: 168 10*3/uL (ref 150–400)
RBC: 4.56 MIL/uL (ref 4.22–5.81)
RDW: 12.1 % (ref 11.5–15.5)
WBC: 9.1 10*3/uL (ref 4.0–10.5)
nRBC: 0 % (ref 0.0–0.2)

## 2020-05-17 LAB — TROPONIN I (HIGH SENSITIVITY)
Troponin I (High Sensitivity): 4 ng/L (ref <18)
Troponin I (High Sensitivity): 4 ng/L (ref <18)

## 2020-05-17 LAB — BASIC METABOLIC PANEL
Anion gap: 10 (ref 5–15)
BUN: 20 mg/dL (ref 6–20)
CO2: 24 mmol/L (ref 22–32)
Calcium: 9.1 mg/dL (ref 8.9–10.3)
Chloride: 102 mmol/L (ref 98–111)
Creatinine, Ser: 0.93 mg/dL (ref 0.61–1.24)
GFR, Estimated: >60 mL/min (ref >60)
Glucose, Bld: 102 mg/dL — ABNORMAL HIGH (ref 70–99)
Potassium: 3.6 mmol/L (ref 3.5–5.1)
Sodium: 136 mmol/L (ref 135–145)

## 2020-05-17 LAB — ETHANOL: Alcohol, Ethyl (B): <10 mg/dL (ref <10)

## 2020-05-17 LAB — CBG MONITORING, ED: Glucose-Capillary: 110 mg/dL — ABNORMAL HIGH (ref 70–99)

## 2020-05-17 NOTE — ED Provider Notes (Signed)
[place] DEPT Provider Note   CSN: [id number] Arrival date & time: 05/17/20  1810     History Chief Complaint  Patient presents with  . Hypoglycemia    Franklin [name] is a 39 y.o. male with PMHx anxiety, bipolar disorder, depression, schizophrenia who presents to the ED today via EMS for hypoglycemia. Per EMS - Pt was having argument with spouse and "started feeling off." Pt reports low blood sugar, EMS noted initial CBG of 70; provided oral glucose 15 g with repeat CBG 106. Pt also complaining of SOB - per EMS pt was hyperventilating on arrival to the scene.   Pt states that he went to the "[employer] store" today. On his way onto the bus his mask broke and they wouldn't let him on. He states he had to walk home in the rain and his wife was getting upset at him and texting/calling him angrily stating that she was going to kick him out of the house and did not want to see him anymore. Pt states that at that time he went into a restaurant to get something to drink as he was feeling "weird" and like he couldn't breathe. He states that EMS was called because no one could understand him at the restaurant. He states his sugar was low with EMS and that's why he is here. No history of diabetes. Pt also reports he was having chest pain that has since resolved and a feeling of "numbness" in his bilateral legs. He does not think this was related to anxiety from his wife yelling at him. While in the ED pt continues to get text messages and states his wife is still texting him and he has nowhere to go now. No SI, HI, or AVH. No hx CAD. Pt is a never smoker.   The history is provided by the patient, medical records and the EMS personnel.       Past Medical History:  Diagnosis Date  . Anxiety   . Bipolar 1 disorder ([place])   . Chronic headaches   . Conversion disorder with seizures or convulsions   . 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 Depression   . Dizziness   . Hx of electroencephalogram 12/2014   normal  . Insomnia   . Mild cognitive impairment   . Psychogenic nonepileptic seizure   . Schizophrenia ([place])   . Schizophrenic disorder ([place])   . Seizure-like activity ([place])   . Seizures [facility])     Patient Active Problem List   Diagnosis Date Noted  . Migraine without aura and without status migrainosus, not intractable 05/22/2018  . Vertigo 05/22/2018  . Psychogenic nonepileptic seizure 02/18/2017  . Cholelithiasis 01/24/2017  . 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 Spells of decreased attentiveness 03/16/2016  . Abnormal EKG 11/30/2015  . Elevated troponin 11/30/2015  . Bipolar affective disorder, most recent episode unspecified type, remission status unspecified 03/11/2015  . Bipolar affective disorder ([place]) 01/08/2015  . Generalized idiopathic epilepsy and epileptic syndromes, without status epilepticus, not intractable ([place]) 11/27/2014  . Seizure disorder ([place]) 10/14/2014    No past surgical history on file.     Family History  Problem Relation Age of Onset  . Kidney failure Mother   . Heart disease Mother     Social History   Tobacco Use  . Smoking status: Never Smoker  . Smokeless tobacco: Never Used  Vaping Use  . Vaping Use: Never used  Substance Use Topics  . Alcohol use: Yes    Comment: special occassions  . Drug use: Never    Home Medications Prior to Admission medications   Medication Sig Start Date End Date Taking? Authorizing Provider  acetaminophen (TYLENOL) 500 MG tablet Take 1 tablet (500 mg total) by mouth every 6 (six) hours as needed. 04/28/20   Varney Biles, MD  albuterol (PROAIR HFA) 108 (90 Base) MCG/ACT inhaler Inhale 1-2 puffs into the lungs every 6 (six) hours as needed for wheezing or shortness of breath. 01/02/20   Zigmund Gottron, NP  atorvastatin (LIPITOR) 10 MG tablet Take 10 mg  by mouth daily. 02/26/20   [provider]  famotidine (PEPCID) 20 MG tablet Take 1 tablet (20 mg total) by mouth 2 (two) times daily for 7 days. Patient taking differently: Take 20 mg by mouth 2 (two) times daily as needed for heartburn.  12/29/19 04/28/20  Darr, Edison Nasuti, PA-C  ibuprofen (ADVIL) 600 MG tablet Take 1 tablet (600 mg total) by mouth every 6 (six) hours as needed. 04/28/20   Varney Biles, MD  lamoTRIgine (LAMICTAL) 100 MG tablet Take 100 mg by mouth daily.    [provider]  lamoTRIgine (LAMICTAL) 25 MG tablet Take 25 mg by mouth 2 (two) times daily. 03/20/20   [provider]  Lumateperone Tosylate (CAPLYTA) 42 MG CAPS Take 42 mg by mouth daily.    [provider]  methocarbamol (ROBAXIN) 500 MG tablet Take 1 tablet (500 mg total) by mouth 2 (two) times daily. 04/28/20   Varney Biles, MD  naproxen (NAPROSYN) 500 MG tablet Take 1 tablet (500 mg total) by mouth 2 (two) times daily with a meal. Patient not taking: Reported on 04/28/2020 03/04/20   Jaynee Eagles, PA-C  ondansetron (ZOFRAN) 4 MG tablet Take 1 tablet (4 mg total) by mouth every 8 (eight) hours as needed for up to 15 doses for nausea or vomiting. 04/09/20   Curatolo, Adam, DO  sulfamethoxazole-trimethoprim (BACTRIM DS) 800-160 MG tablet Take 1 tablet by mouth 2 (two) times daily. Patient not taking: Reported on 04/28/2020 03/04/20   Jaynee Eagles, PA-C  traMADol (ULTRAM) 50 MG tablet Take 1 tablet (50 mg total) by mouth every 6 (six) hours as needed. Patient not taking: Reported on 04/28/2020 03/09/20   Delia Heady, PA-C  diphenhydrAMINE (BENADRYL) 25 MG tablet Take 2 tablets (50 mg total) by mouth at bedtime as needed for up to 7 days. 12/29/19 02/15/20  Darr, Edison Nasuti, PA-C  loratadine (CLARITIN) 10 MG tablet Take 1 tablet (10 mg total) by mouth daily. Patient not taking: Reported on 10/11/2019 09/18/19 12/02/19  Charlott Rakes, MD  Oxcarbazepine (TRILEPTAL) 300 MG tablet Take 1/2 tablet twice a  day Patient not taking: Reported on 07/08/2019 01/26/18 07/09/19  Argentina Donovan, PA-C    Allergies    Coconut oil, Other, Codeine, Depakote [divalproex sodium], Hydroxyzine, Penicillins, Tape, Keflex [cephalexin], Latex, and Levetiracetam  Review of Systems   Review of Systems  Constitutional: Negative for chills and fever.  Respiratory: Positive for shortness of breath.   Cardiovascular: Positive for chest pain.  Neurological: Positive for numbness (bilateral legs).  All other systems reviewed and are negative.   Physical Exam Updated Vital Signs BP (!) 128/101 (BP Location: Right Arm)   Pulse 89   Temp 98.1 F (36.7 C) (Oral)   Resp 13   Ht 6' (1.829 m)   Wt 68 kg   SpO2  97%   BMI 20.34 kg/m   Physical Exam Vitals and nursing note reviewed.  Constitutional:      Appearance: He is not ill-appearing or diaphoretic.     Comments: Texting on his phone during entire exam  HENT:     Head: Normocephalic and atraumatic.  Eyes:     Extraocular Movements: Extraocular movements intact.     Conjunctiva/sclera: Conjunctivae normal.     Pupils: Pupils are equal, round, and reactive to light.  Cardiovascular:     Rate and Rhythm: Normal rate and regular rhythm.     Pulses: Normal pulses.  Pulmonary:     Effort: Pulmonary effort is normal.     Breath sounds: Normal breath sounds. No wheezing, rhonchi or rales.  Chest:     Chest wall: No tenderness.  Abdominal:     Palpations: Abdomen is soft.     Tenderness: There is no abdominal tenderness. There is no guarding or rebound.  Musculoskeletal:     Cervical back: Neck supple.  Skin:    General: Skin is warm and dry.  Neurological:     Mental Status: He is alert.     Comments: Alert and oriented to self, place, time and event.   Speech is fluent, clear without dysarthria or dysphasia.   Strength 5/5 in upper/lower extremities  Sensation intact in upper/lower extremities   Normal gait.  Negative Romberg. No pronator  drift.  Normal finger-to-nose and feet tapping.  CN I not tested  CN II grossly intact visual fields bilaterally. Did not visualize posterior eye.   CN III, IV, VI PERRLA and EOMs intact bilaterally  CN V Intact sensation to sharp and light touch to the face  CN VII facial movements symmetric  CN VIII not tested  CN IX, X no uvula deviation, symmetric rise of soft palate  CN XI 5/5 SCM and trapezius strength bilaterally  CN XII Midline tongue protrusion, symmetric L/R movements      ED Results / Procedures / Treatments   Labs (all labs ordered are listed, but only abnormal results are displayed) Labs Reviewed  BASIC METABOLIC PANEL - Abnormal; Notable for the following components:      Result Value   Glucose, Bld 102 (*)    All other components within normal limits  CBG MONITORING, ED - Abnormal; Notable for the following components:   Glucose-Capillary 110 (*)    All other components within normal limits  CBC WITH DIFFERENTIAL/PLATELET  ETHANOL  TROPONIN I (HIGH SENSITIVITY)  TROPONIN I (HIGH SENSITIVITY)    EKG None EKG:  nonspecific ST and T waves changes, sinus bradycardia.  Radiology DG Chest 2 View  Result Date: 05/17/2020 CLINICAL DATA:  Chest pain, dizziness and shortness of breath. EXAM: CHEST - 2 VIEW COMPARISON:  April 29, 2020 FINDINGS: The heart size and mediastinal contours are within normal limits. Both lungs are clear. The visualized skeletal structures are unremarkable. IMPRESSION: No active cardiopulmonary disease. Electronically Signed   By: Virgina Norfolk M.D.   On: 05/17/2020 19:27    Procedures Procedures (including critical care time)  Medications Ordered in ED Medications - No data to display  ED Course  I have reviewed the triage vital signs and the nursing notes.  Pertinent labs & imaging results that were available during my care of the patient were reviewed by me and considered in my medical decision making (see chart for  details).    MDM Rules/Calculators/A&P  39 year old male who presents to the ED today via EMS for hypoglycemia.  Reports that he was walking home in the rain when he began to feel weird as his wife was texting and calling him yelling at him angrily.  He went to a restaurant and EMS was called.  His CBG on EMS arrival was 70, he was given 15 g of oral glucose with an improvement to 106.  No history of diabetes.  She also reports she was having chest pain and shortness of breath with a numbness in his bilateral legs that has since resolved.  Per EMS patient was found hyperventilating on arrival.  He denies that this could be anxiety from his wife yelling at him.  During the exam patient continues to text on his phone and states that his wife keeps texting him mean messages.  He states he has nowhere to go as his wife has kicked him out of the house.  He states that they are both on the lease however.  Had a lengthy discussion with patient that she cannot just take him out of the house legally given he has a tenant and is on the lease.  He states that if he gets sent home he will just come back to the ED.  Unfortunately social work is not here currently to give patient information for homeless shelters.  Will work-up for his chest pain and shortness of breath at this time however does sound anxiety related due to an argument with his wife.  If no acute findings I do not feel patient requires admission or further evaluation in the ED and will be discharged home.   EKG with sinus bradycardia at a rate of 59. Pt is typically in the 72s. Nonspecific T wave changes.  CBC without leukocytosis. Hgb stable at 13.8 BMP without electrolyte abnormalities.  EtOH negative Troponin of 4. Will repeat given chest pain started just prior to arrival however I have lower suspicion for ACS Chest xray clear  Repeat trop 4; unchanged.  Will discharge home at this time. On reevaluation pt sitting  comfortably in his bed texting on his phone in NAD. When he was told he needs to walk he stood up and then stated that he was too dizzy. This has not been pt's complaint the entire time he has been in the ED. I suspect secondary gain at this time as he is still arguing with his wife. He has no focal neuro deficits on exam and no nystagmus. Will discharge with close PCP follow up.   This note was prepared using Dragon voice recognition software and may include unintentional dictation errors due to the inherent limitations of voice recognition software.    Final Clinical Impression(s) / ED Diagnoses Final diagnoses:  Nonspecific chest pain  Hypoglycemia    Rx / DC Orders ED Discharge Orders    None       Discharge Instructions     Your labwork was reassuring in the ED today. Please follow up with your PCP for same.   Return to the ED for any worsening symptoms        Eustaquio Maize, Hershal Coria 05/17/20 2202    Lacretia Leigh, MD 05/18/20 1311

## 2020-05-17 NOTE — Discharge Instructions (Addendum)
Your labwork was reassuring in the ED today. Please follow up with your PCP for same.   Return to the ED for any worsening symptoms

## 2020-05-17 NOTE — ED Notes (Signed)
Pt stood up slowly without assistance and attempted to ambulate around the department. He took one step and said that he was too dizzy and that the room is spinning.

## 2020-05-17 NOTE — ED Notes (Signed)
Pt in bed on phone. Respirations even and unlabored. States he is here because he was walking to the bus stop and  the bus would not let him on without a mask, and his wife is mad at him and he can not go home. States that during the walk in the rain he started feeling dizzy and his heart was beating fast.

## 2020-05-17 NOTE — ED Triage Notes (Signed)
Pt BIBA from store-  Per EMS- Pt was having argument with spouse and "started feeling off".  Pt reports low blood sugar, EMS noted initial cbg of 70; provided oral glucose 15g --> CBG now 106.  Pt was also reporting ShOB; EMS reports pt was hyperventilating on arrival to scene.

## 2020-06-10 ENCOUNTER — Emergency Department (HOSPITAL_COMMUNITY): Payer: Medicare Other

## 2020-06-10 ENCOUNTER — Encounter (HOSPITAL_COMMUNITY): Payer: Self-pay

## 2020-06-10 ENCOUNTER — Other Ambulatory Visit: Payer: Self-pay

## 2020-06-10 ENCOUNTER — Emergency Department (HOSPITAL_COMMUNITY)
Admission: EM | Admit: 2020-06-10 | Discharge: 2020-06-10 | Discharge status: Against Medical Advice | Payer: Medicare Other | Attending: Emergency Medicine | Admitting: Emergency Medicine

## 2020-06-10 DIAGNOSIS — M25552 Pain in left hip: Secondary | ICD-10-CM | POA: Insufficient documentation

## 2020-06-10 DIAGNOSIS — Z5321 Procedure and treatment not carried out due to patient leaving prior to being seen by health care provider: Secondary | ICD-10-CM | POA: Diagnosis not present

## 2020-06-10 NOTE — ED Triage Notes (Signed)
Pt reports x2 days hip pain. Pt denies any trauma. Pt reports it hurts to walk,

## 2020-06-10 NOTE — ED Notes (Signed)
Pt called,no answer.

## 2020-06-10 NOTE — ED Notes (Signed)
Pt called for room, no answer.

## 2020-08-08 ENCOUNTER — Other Ambulatory Visit: Payer: Self-pay

## 2020-08-08 ENCOUNTER — Encounter (HOSPITAL_COMMUNITY): Payer: Self-pay

## 2020-08-08 ENCOUNTER — Emergency Department (HOSPITAL_COMMUNITY)
Admission: EM | Admit: 2020-08-08 | Discharge: 2020-08-08 | Disposition: A | Discharge status: Home / Self Care | Payer: Medicare Other | Attending: Emergency Medicine | Admitting: Emergency Medicine

## 2020-08-08 ENCOUNTER — Emergency Department (HOSPITAL_COMMUNITY): Payer: Medicare Other

## 2020-08-08 DIAGNOSIS — R001 Bradycardia, unspecified: Secondary | ICD-10-CM | POA: Diagnosis not present

## 2020-08-08 DIAGNOSIS — Z91040 Latex allergy status: Secondary | ICD-10-CM | POA: Diagnosis not present

## 2020-08-08 DIAGNOSIS — R42 Dizziness and giddiness: Secondary | ICD-10-CM | POA: Insufficient documentation

## 2020-08-08 DIAGNOSIS — Z79899 Other long term (current) drug therapy: Secondary | ICD-10-CM | POA: Diagnosis not present

## 2020-08-08 LAB — CBC
HCT: 41.8 % (ref 39.0–52.0)
Hemoglobin: 14 g/dL (ref 13.0–17.0)
MCH: 30.4 pg (ref 26.0–34.0)
MCHC: 33.5 g/dL (ref 30.0–36.0)
MCV: 90.7 fL (ref 80.0–100.0)
Platelets: 187 10*3/uL (ref 150–400)
RBC: 4.61 MIL/uL (ref 4.22–5.81)
RDW: 12.4 % (ref 11.5–15.5)
WBC: 4.6 10*3/uL (ref 4.0–10.5)
nRBC: 0 % (ref 0.0–0.2)

## 2020-08-08 LAB — BASIC METABOLIC PANEL
Anion gap: 6 (ref 5–15)
BUN: 10 mg/dL (ref 6–20)
CO2: 26 mmol/L (ref 22–32)
Calcium: 9.2 mg/dL (ref 8.9–10.3)
Chloride: 107 mmol/L (ref 98–111)
Creatinine, Ser: 0.96 mg/dL (ref 0.61–1.24)
GFR, Estimated: >60 mL/min (ref >60)
Glucose, Bld: 89 mg/dL (ref 70–99)
Potassium: 3.9 mmol/L (ref 3.5–5.1)
Sodium: 139 mmol/L (ref 135–145)

## 2020-08-08 LAB — CBG MONITORING, ED
Glucose-Capillary: 87 mg/dL (ref 70–99)
Glucose-Capillary: 73 mg/dL (ref 70–99)

## 2020-08-08 LAB — URINALYSIS, ROUTINE W REFLEX MICROSCOPIC
Bilirubin Urine: NEGATIVE
Glucose, UA: NEGATIVE mg/dL
Hgb urine dipstick: NEGATIVE
Ketones, ur: NEGATIVE mg/dL
Leukocytes,Ua: NEGATIVE
Nitrite: NEGATIVE
Protein, ur: NEGATIVE mg/dL
Specific Gravity, Urine: 1.017 (ref 1.005–1.030)
pH: 8 (ref 5.0–8.0)

## 2020-08-08 LAB — RAPID URINE DRUG SCREEN, HOSP PERFORMED
Amphetamines: NOT DETECTED
Barbiturates: NOT DETECTED
Benzodiazepines: NOT DETECTED
Cocaine: NOT DETECTED
Opiates: NOT DETECTED
Tetrahydrocannabinol: NOT DETECTED

## 2020-08-08 LAB — TROPONIN I (HIGH SENSITIVITY): Troponin I (High Sensitivity): 2 ng/L (ref <18)

## 2020-08-08 LAB — TSH: TSH: 1.396 u[IU]/mL (ref 0.350–4.500)

## 2020-08-08 MED ORDER — MECLIZINE HCL 25 MG PO TABS
25.0000 mg | ORAL_TABLET | Freq: Once | ORAL | Status: AC
  Administered 2020-08-08: 25 mg via ORAL
  Filled 2020-08-08: qty 1

## 2020-08-08 MED ORDER — MECLIZINE HCL 25 MG PO TABS: 25.0000 mg | ORAL_TABLET | Freq: Three times a day (TID) | ORAL | 0 refills | Status: DC | PRN

## 2020-08-08 MED ORDER — SODIUM CHLORIDE 0.9 % IV BOLUS
1000.0000 mL | Freq: Once | INTRAVENOUS | Status: AC
  Administered 2020-08-08: 1000 mL via INTRAVENOUS

## 2020-08-08 NOTE — ED Notes (Addendum)
Pt ambulates with steady gait, denies any dizziness,or any s/s, HR  Of 48

## 2020-08-08 NOTE — ED Provider Notes (Signed)
[facility] Provider Note   CSN: [id number] Arrival date & time: 08/08/20  1259     History Chief Complaint  Patient presents with  . Dizziness    Franklin [name] is a 40 y.o. male with PMHx anxiety, bipolar disorder, conversion disorder, non epileptic seizures, schizophrenia who presents to the ED today with complaints of lightheadedness as well as room spinning dizziness that began this morning. Pt states when he woke up to get ready for work he stood up from the bed and noticed he felt like he could pass out. He states that even laying he feels like he could pass out at any second. Pt however also complains of room spinning dizziness. It appears he does have a hx of vertigo in his PMHx. When EMS arrived pt was noted to be bradycardic with a HR 48-52. Pt does mention he has felt short of breath intermittently for the past week however did not think much of it. Denies any chest pain. No Fhx of CAD. Pt is a never smoker. He denies any new medications. No recent changes in medications. Pt is not on a beta blocker. Denies drugs or ETOH. No other complaints at this time including headache, vision changes, unilateral weakness or numbness, speech changes, confusion, or any other associated symptoms.   The history is provided by the patient, the EMS personnel and medical records.       Past Medical History:  Diagnosis Date  . Anxiety   . Bipolar 1 disorder ([place])   . Chronic headaches   . Conversion disorder with seizures or convulsions   . Convulsion, non-epileptic [facility])    "raises the possibility of non-epileptic events" per Neuro MD office note 03/2015  . Depression   . Dizziness   . Hx of electroencephalogram 12/2014   normal  . Insomnia   . Mild cognitive impairment   . Psychogenic nonepileptic seizure   . Schizophrenia ([place])   . Schizophrenic disorder ([place])   . Seizure-like activity ([place])   . Seizures [facility])     Patient Active Problem List    Diagnosis Date Noted  . Migraine without aura and without status migrainosus, not intractable 05/22/2018  . Vertigo 05/22/2018  . Psychogenic nonepileptic seizure 02/18/2017  . Cholelithiasis 01/24/2017  . 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 Spells of decreased attentiveness 03/16/2016  . Abnormal EKG 11/30/2015  . Elevated troponin 11/30/2015  . Bipolar affective disorder, most recent episode unspecified type, remission status unspecified 03/11/2015  . Bipolar affective disorder ([place]) 01/08/2015  . Generalized idiopathic epilepsy and epileptic syndromes, without status epilepticus, not intractable ([place]) 11/27/2014  . Seizure disorder ([place]) 10/14/2014    History reviewed. No pertinent surgical history.     Family History  Problem Relation Age of Onset  . Kidney failure Mother   . Heart disease Mother     Social History   Tobacco Use  . Smoking status: Never Smoker  . Smokeless tobacco: Never Used  Vaping Use  . Vaping Use: Never used  Substance Use Topics  . Alcohol use: Yes    Comment: special occassions  . Drug use: Never    Home Medications Prior to Admission medications   Medication Sig Start Date End Date Taking? Authorizing Provider  meclizine (ANTIVERT) 25 MG tablet Take 1 tablet (25 mg total) by mouth 3 (three) times daily as needed for dizziness. 08/08/20  Yes Venter, Margaux, PA-C  acetaminophen (TYLENOL) 500 MG tablet Take 1 tablet (  500 mg total) by mouth every 6 (six) hours as needed. 04/28/20   Varney Biles, MD  albuterol (PROAIR HFA) 108 (90 Base) MCG/ACT inhaler Inhale 1-2 puffs into the lungs every 6 (six) hours as needed for wheezing or shortness of breath. 01/02/20   Zigmund Gottron, NP  atorvastatin (LIPITOR) 10 MG tablet Take 10 mg by mouth daily. 02/26/20   [provider]  famotidine (PEPCID) 20 MG tablet Take 1 tablet (20 mg total) by mouth 2 (two) times daily for 7 days. Patient taking differently: Take 20 mg  by mouth 2 (two) times daily as needed for heartburn.  12/29/19 04/28/20  Darr, Edison Nasuti, PA-C  ibuprofen (ADVIL) 600 MG tablet Take 1 tablet (600 mg total) by mouth every 6 (six) hours as needed. 04/28/20   Varney Biles, MD  lamoTRIgine (LAMICTAL) 100 MG tablet Take 100 mg by mouth daily.    [provider]  lamoTRIgine (LAMICTAL) 25 MG tablet Take 25 mg by mouth 2 (two) times daily. 03/20/20   [provider]  Lumateperone Tosylate (CAPLYTA) 42 MG CAPS Take 42 mg by mouth daily.    [provider]  methocarbamol (ROBAXIN) 500 MG tablet Take 1 tablet (500 mg total) by mouth 2 (two) times daily. 04/28/20   Varney Biles, MD  naproxen (NAPROSYN) 500 MG tablet Take 1 tablet (500 mg total) by mouth 2 (two) times daily with a meal. Patient not taking: Reported on 04/28/2020 03/04/20   Jaynee Eagles, PA-C  ondansetron (ZOFRAN) 4 MG tablet Take 1 tablet (4 mg total) by mouth every 8 (eight) hours as needed for up to 15 doses for nausea or vomiting. 04/09/20   Curatolo, Adam, DO  sulfamethoxazole-trimethoprim (BACTRIM DS) 800-160 MG tablet Take 1 tablet by mouth 2 (two) times daily. Patient not taking: Reported on 04/28/2020 03/04/20   Jaynee Eagles, PA-C  traMADol (ULTRAM) 50 MG tablet Take 1 tablet (50 mg total) by mouth every 6 (six) hours as needed. Patient not taking: Reported on 04/28/2020 03/09/20   Delia Heady, PA-C  diphenhydrAMINE (BENADRYL) 25 MG tablet Take 2 tablets (50 mg total) by mouth at bedtime as needed for up to 7 days. 12/29/19 02/15/20  Darr, Edison Nasuti, PA-C  loratadine (CLARITIN) 10 MG tablet Take 1 tablet (10 mg total) by mouth daily. Patient not taking: Reported on 10/11/2019 09/18/19 12/02/19  Charlott Rakes, MD  Oxcarbazepine (TRILEPTAL) 300 MG tablet Take 1/2 tablet twice a day Patient not taking: Reported on 07/08/2019 01/26/18 07/09/19  Argentina Donovan, PA-C    Allergies    Coconut oil, Other, Codeine, Depakote [divalproex sodium], Hydroxyzine, Penicillins,  Tape, Keflex [cephalexin], Latex, and Levetiracetam  Review of Systems   Review of Systems  Constitutional: Negative for chills and fever.  Eyes: Negative for visual disturbance.  Respiratory: Positive for shortness of breath. Negative for cough.   Cardiovascular: Negative for chest pain, palpitations and leg swelling.  Gastrointestinal: Negative for abdominal pain, nausea and vomiting.  Neurological: Positive for dizziness and light-headedness. Negative for syncope, speech difficulty, weakness, numbness and headaches.  All other systems reviewed and are negative.   Physical Exam Updated Vital Signs BP 129/81   Pulse (!) 52   Temp 98.9 F (37.2 C)   Resp 18   SpO2 100%   Physical Exam Vitals and nursing note reviewed.  Constitutional:      Appearance: He is not ill-appearing or diaphoretic.  HENT:     Head: Normocephalic and atraumatic.  Eyes:     Extraocular Movements:  Extraocular movements intact.     Conjunctiva/sclera: Conjunctivae normal.     Pupils: Pupils are equal, round, and reactive to light.     Comments: Horizontal nystagmus bilaterally  Cardiovascular:     Rate and Rhythm: Regular rhythm. Bradycardia present.     Pulses: Normal pulses.  Pulmonary:     Effort: Pulmonary effort is normal.     Breath sounds: Normal breath sounds. No wheezing, rhonchi or rales.  Abdominal:     Palpations: Abdomen is soft.     Tenderness: There is no abdominal tenderness. There is no guarding or rebound.  Musculoskeletal:     Cervical back: Neck supple.     Right lower leg: No edema.     Left lower leg: No edema.  Skin:    General: Skin is warm and dry.  Neurological:     Mental Status: He is alert.     Comments: Alert and oriented to self, place, time and event.   Speech is fluent, clear without dysarthria or dysphasia.   Strength 5/5 in upper/lower extremities  Sensation intact in upper/lower extremities   Normal gait.  Negative Romberg. No pronator drift.  Normal  finger-to-nose and feet tapping.  CN I not tested  CN II grossly intact visual fields bilaterally. Did not visualize posterior eye.   CN III, IV, VI PERRLA and EOMs intact bilaterally  CN V Intact sensation to sharp and light touch to the face  CN VII facial movements symmetric  CN VIII not tested  CN IX, X no uvula deviation, symmetric rise of soft palate  CN XI 5/5 SCM and trapezius strength bilaterally  CN XII Midline tongue protrusion, symmetric L/R movements      ED Results / Procedures / Treatments   Labs (all labs ordered are listed, but only abnormal results are displayed) Labs Reviewed  BASIC METABOLIC PANEL  CBC  URINALYSIS, ROUTINE W REFLEX MICROSCOPIC  TSH  RAPID URINE DRUG SCREEN, HOSP PERFORMED  CBG MONITORING, ED  CBG MONITORING, ED  CBG MONITORING, ED  TROPONIN I (HIGH SENSITIVITY)    EKG EKG Interpretation  Date/Time:  Friday August 08 2020 13:02:19 EST Ventricular Rate:  50 PR Interval:  132 QRS Duration: 110 QT Interval:  424 QTC Calculation: 386 R Axis:   -4 Text Interpretation: Sinus bradycardia with sinus arrhythmia Otherwise normal ECG Confirmed by Davonna Belling [phone]) on 08/08/2020 4:15:11 PM   Radiology DG Chest Port 1 View  Result Date: 08/08/2020 CLINICAL DATA:  Shortness of breath for 1 week EXAM: PORTABLE CHEST 1 VIEW COMPARISON:  05/17/2020 FINDINGS: The heart size and mediastinal contours are within normal limits. Atherosclerotic calcification along the aortic knob. No focal airspace consolidation, pleural effusion, or pneumothorax. The visualized skeletal structures are unremarkable. IMPRESSION: 1. No active disease. 2. Aortic atherosclerosis. Electronically Signed   By: Davina Poke D.O.   On: 08/08/2020 16:58    Procedures Procedures   Medications Ordered in ED Medications  sodium chloride 0.9 % bolus 1,000 mL (0 mLs Intravenous Stopped 08/08/20 1858)  meclizine (ANTIVERT) tablet 25 mg (25 mg Oral Given 08/08/20 1716)     ED Course  I have reviewed the triage vital signs and the nursing notes.  Pertinent labs & imaging results that were available during my care of the patient were reviewed by me and considered in my medical decision making (see chart for details).    MDM Rules/Calculators/A&P  40 year old male who presents to the ED today with complaint of dizziness that began this morning. He was found to be bradycardic with EMS with HR 48-52. On arrival to the ED remainder of VSS. When pt is brought back to a room he complains of room spinning dizziness as well as lightheadedness. He also mentions he has felt SOB for the past week however he is not working hard to breathe at this time and satting 100% on RA. Per chart review it does appear pt has had bradycardia in the past on previous EKGs and this is not suspected to be new today or the cause of his symptoms. EKG today with sinus bradycardia. Pt is hemodynamically stable. Denies any new medications or changes in his medications - he is on lamictal and does appear this can cause some bradycardia however given the bradycardia is not new I am not concerned today. On exam pt is noted to have horizontal nystagmus; it appears he has a hx of vertigo in his PMHx. No other focal neuro deficits at this time. Pt did have a CBC and BMP while in the waiting room without acute findings. He is vague regarding his SOB - will plan for CXR and troponin at this time however low suspicion for ACS at this time. No hx of CHF however may consider adding a BNP if cxr shows concern for vascular congestion. CBG 73. Will plan for orthostatics given complaint of lightheadedness as well; pt unable to differentiate lightheadedness from the dizziness and states he is having both.  Orthostatics negative It does appear pt had several visits in 2019 and 2020 for dizziness related to vertigo and was on Meclizine. He states he has been off of the meclizine for "awhile." He  does report the room spinning dizziness feels similar however he has not had lightheadedness with it in the past. Will provide fluids and meclizine and see if this does not improve pt's symptoms.   U/A without infection UDS negative  Pt has been able to ambulate without complaints of dizziness. HR continues to be low. He reports improvement after meclizine. Will discharge home with same. Does not appear pt has seen a neurologist in the past for same; will place referral for him. Will also place ambulatory referral to cardiology for bradycardia however again I do not suspect this is the cause of pt's symptoms today as he has been bradycardic in the past. He is in agreement with plan and stable for discharge.   This note was prepared using Dragon voice recognition software and may include unintentional dictation errors due to the inherent limitations of voice recognition software.    Final Clinical Impression(s) / ED Diagnoses Final diagnoses:  Dizziness  Bradycardia    Rx / DC Orders ED Discharge Orders         Ordered    Ambulatory referral to Neurology       Comments: An appointment is requested in approximately: 2 weeks   08/08/20 1904    Ambulatory referral to Cardiology        08/08/20 1904    meclizine (ANTIVERT) 25 MG tablet  3 times daily PRN        08/08/20 1904           Discharge Instructions     Please pick up medication and take as needed for dizziness.  Referrals to both cardiology and neurology have been placed for you - they will call you to schedule an appointment. You can  follow up with the neurologist regarding your dizziness however it is suspected that this is related to vertigo today. You can follow up with the cardiologist regarding your low resting heart rate.   Please drink plenty of fluids to stay hydrated.   Follow up with your PCP regarding your ED visit as well  Return to the ED for any worsening symptoms       Eustaquio Maize, Hershal Coria 08/08/20  1906    Davonna Belling, MD 08/08/20 2328

## 2020-08-08 NOTE — Discharge Instructions (Addendum)
Please pick up medication and take as needed for dizziness.  Referrals to both cardiology and neurology have been placed for you - they will call you to schedule an appointment. You can follow up with the neurologist regarding your dizziness however it is suspected that this is related to vertigo today. You can follow up with the cardiologist regarding your low resting heart rate.   Please drink plenty of fluids to stay hydrated.   Follow up with your PCP regarding your ED visit as well  Return to the ED for any worsening symptoms

## 2020-08-08 NOTE — ED Triage Notes (Signed)
Pt from home with ems c.o dizziness since this morning. Pt also bradycardic 48-52 with ems. Pt a.o

## 2020-08-19 NOTE — Progress Notes (Incomplete)
CARDIOLOGY CONSULT NOTE       Patient ID: Franklin [name] MRN: [id number] DOB/AGE: Jun 11, 1980 40 y.o.  Admit date: (Not on file) Referring Physician: Delfin [name] ER PA Primary Physician: Cipriano Mile, NP Primary Cardiologist: New Reason for Consultation: Bradycardia   Active Problems:   * No active [name] problems. *   HPI:  40 y.o. referred by Franklin [name] ER PA Franklin [name] for bradycardia. History of Bipolar 1 Disorder / Anxiety Pseudo seizures and Dizziness Seen in ED 08/08/20 with dizziness , room spinning beginning that morning Felt like he was going to pass out even supine. EMS noted HR 48-52 bpm VSS normal not postural ECG;s as far back as 2015 have shown bradycardia with HR;s in 50's Labs fine UDS negative exam noted nystagmus Has had vertigo in past and used meclizine Referred to both neurology/cardiology and sent home with script for antivert   ECG showed SB rate 50 bpm and was normal No arrhythmia or AV block while on telemetry   ***  ROS All other systems reviewed and negative except as noted above  Past Medical History:  Diagnosis Date  . Anxiety   . Bipolar 1 disorder ([place])   . Chronic headaches   . Conversion disorder with seizures or convulsions   . Convulsion, non-epileptic [name])    "raises the possibility of non-epileptic events" per Neuro MD office note 03/2015  . Depression   . Dizziness   . Hx of electroencephalogram 12/2014   normal  . Insomnia   . Mild cognitive impairment   . Psychogenic nonepileptic seizure   . Schizophrenia ([place])   . Schizophrenic disorder ([place])   . Seizure-like activity ([place])   . Seizures ([place])     Family History  Problem Relation Age of Onset  . Kidney failure Mother   . Heart disease Mother     Social History   Socioeconomic History  . Marital status: Married    Spouse name: Angie  . Number of children: 0  . Years of education: Not on file  . Highest education level: Not on file  Occupational History     Comment: disability  Tobacco Use  . Smoking status: Never Smoker  . Smokeless tobacco: Never Used  Vaping Use  . Vaping Use: Never used  Substance and Sexual Activity  . Alcohol use: Yes    Comment: special occassions  . Drug use: Never  . Sexual activity: Not on file  Other Topics Concern  . Not on file  Social History Narrative   ** Merged History Encounter **       Lives with wife Sod[place], 5 day   Social Determinants of Health   Financial Resource Strain: Not on file  Food Insecurity: Not on file  Transportation Needs: Not on file  Physical Activity: Not on file  Stress: Not on file  Social Connections: Not on file  Intimate Partner Violence: Not on file    No past surgical history on file.    Current Outpatient Medications:  .  acetaminophen (TYLENOL) 500 MG tablet, Take 1 tablet (500 mg total) by mouth every 6 (six) hours as needed., Disp: 30 tablet, Rfl: 0 .  albuterol (PROAIR HFA) 108 (90 Base) MCG/ACT inhaler, Inhale 1-2 puffs into the lungs every 6 (six) hours as needed for wheezing or shortness of breath., Disp: 8 g, Rfl: 0 .  atorvastatin (LIPITOR) 10 MG tablet, Take 10 mg by mouth daily., Disp: , Rfl:  .  famotidine (PEPCID) 20 MG  tablet, Take 1 tablet (20 mg total) by mouth 2 (two) times daily for 7 days. (Patient taking differently: Take 20 mg by mouth 2 (two) times daily as needed for heartburn. ), Disp: 14 tablet, Rfl: 0 .  ibuprofen (ADVIL) 600 MG tablet, Take 1 tablet (600 mg total) by mouth every 6 (six) hours as needed., Disp: 30 tablet, Rfl: 0 .  lamoTRIgine (LAMICTAL) 100 MG tablet, Take 100 mg by mouth daily., Disp: , Rfl:  .  lamoTRIgine (LAMICTAL) 25 MG tablet, Take 25 mg by mouth 2 (two) times daily., Disp: , Rfl:  .  Lumateperone Tosylate (CAPLYTA) 42 MG CAPS, Take 42 mg by mouth daily., Disp: , Rfl:  .  meclizine (ANTIVERT) 25 MG tablet, Take 1 tablet (25 mg total) by mouth 3 (three) times daily as needed for dizziness., Disp: 30 tablet, Rfl: 0 .   methocarbamol (ROBAXIN) 500 MG tablet, Take 1 tablet (500 mg total) by mouth 2 (two) times daily., Disp: 10 tablet, Rfl: 0 .  naproxen (NAPROSYN) 500 MG tablet, Take 1 tablet (500 mg total) by mouth 2 (two) times daily with a meal. (Patient not taking: Reported on 04/28/2020), Disp: 30 tablet, Rfl: 0 .  ondansetron (ZOFRAN) 4 MG tablet, Take 1 tablet (4 mg total) by mouth every 8 (eight) hours as needed for up to 15 doses for nausea or vomiting., Disp: 15 tablet, Rfl: 0 .  sulfamethoxazole-trimethoprim (BACTRIM DS) 800-160 MG tablet, Take 1 tablet by mouth 2 (two) times daily. (Patient not taking: Reported on 04/28/2020), Disp: 20 tablet, Rfl: 0 .  traMADol (ULTRAM) 50 MG tablet, Take 1 tablet (50 mg total) by mouth every 6 (six) hours as needed. (Patient not taking: Reported on 04/28/2020), Disp: 5 tablet, Rfl: 0    Physical Exam: There were no vitals taken for this visit.   Affect appropriate Healthy:  appears stated age 65: normal Neck supple with no adenopathy JVP normal no bruits no thyromegaly Lungs clear with no wheezing and good diaphragmatic motion Heart:  S1/S2 no murmur, no rub, gallop or click PMI normal Abdomen: benighn, BS positve, no tenderness, no AAA no bruit.  No HSM or HJR Distal pulses intact with no bruits No edema Neuro non-focal Skin warm and dry No muscular weakness   Labs:   Lab Results  Component Value Date   WBC 4.6 08/08/2020   HGB 14.0 08/08/2020   HCT 41.8 08/08/2020   MCV 90.7 08/08/2020   PLT 187 08/08/2020   No results for input(s): NA, K, CL, CO2, BUN, CREATININE, CALCIUM, PROT, BILITOT, ALKPHOS, ALT, AST, GLUCOSE in the last 168 hours.  Invalid input(s): LABALBU Lab Results  Component Value Date   CKTOTAL 84 10/19/2016   TROPONINI <0.03 12/01/2015    Lab Results  Component Value Date   CHOL 168 10/14/2014   Lab Results  Component Value Date   HDL 44 10/14/2014   Lab Results  Component Value Date   LDLCALC 113 (H) 10/14/2014    Lab Results  Component Value Date   TRIG 53 10/14/2014   Lab Results  Component Value Date   CHOLHDL 3.8 10/14/2014   No results found for: LDLDIRECT    Radiology: [name] Chest Port 1 View  Result Date: 08/08/2020 CLINICAL DATA:  Shortness of breath for 1 week EXAM: PORTABLE CHEST 1 VIEW COMPARISON:  05/17/2020 FINDINGS: The heart size and mediastinal contours are within normal limits. Atherosclerotic calcification along the aortic knob. No focal airspace consolidation, pleural effusion, or pneumothorax. The visualized  skeletal structures are unremarkable. IMPRESSION: 1. No active disease. 2. Aortic atherosclerosis. Electronically Signed   By: Davina Poke D.O.   On: 08/08/2020 16:58    EKG: See HPI stable since 2014    ASSESSMENT AND PLAN:   1. Bradycardia: chronic no correlation with symptoms Not on beta blocker or AV nodal drugs TSH normal in ER as was K/Cr  Check echo r/o structural disease Discussed ETT to check HR response to exercise ***  2. Vertigo:  F/u neurology has Antivert consider vestibular PT  3. Bipolar:  Continue Lamictal  4. HLD  On statin labs with primary   Echo ETT F/U cardiology PRN   Signed: Jenkins Rouge 08/19/2020, 4:46 PM

## 2020-08-22 ENCOUNTER — Emergency Department (HOSPITAL_COMMUNITY): Payer: Medicare Other

## 2020-08-22 ENCOUNTER — Encounter (HOSPITAL_COMMUNITY): Payer: Self-pay

## 2020-08-22 ENCOUNTER — Emergency Department (HOSPITAL_COMMUNITY)
Admission: EM | Admit: 2020-08-22 | Discharge: 2020-08-22 | Disposition: A | Discharge status: Home / Self Care | Payer: Medicare Other | Attending: Emergency Medicine | Admitting: Emergency Medicine

## 2020-08-22 ENCOUNTER — Other Ambulatory Visit: Payer: Self-pay

## 2020-08-22 DIAGNOSIS — M542 Cervicalgia: Secondary | ICD-10-CM | POA: Insufficient documentation

## 2020-08-22 DIAGNOSIS — Z8669 Personal history of other diseases of the nervous system and sense organs: Secondary | ICD-10-CM | POA: Diagnosis not present

## 2020-08-22 DIAGNOSIS — R569 Unspecified convulsions: Secondary | ICD-10-CM | POA: Insufficient documentation

## 2020-08-22 DIAGNOSIS — Z91040 Latex allergy status: Secondary | ICD-10-CM | POA: Diagnosis not present

## 2020-08-22 LAB — CBC WITH DIFFERENTIAL/PLATELET
Abs Immature Granulocytes: 0.01 10*3/uL (ref 0.00–0.07)
Basophils Absolute: 0 10*3/uL (ref 0.0–0.1)
Basophils Relative: 1 %
Eosinophils Absolute: 0 10*3/uL (ref 0.0–0.5)
Eosinophils Relative: 1 %
HCT: 44 % (ref 39.0–52.0)
Hemoglobin: 14.7 g/dL (ref 13.0–17.0)
Immature Granulocytes: 0 %
Lymphocytes Relative: 30 %
Lymphs Abs: 1.3 10*3/uL (ref 0.7–4.0)
MCH: 30.1 pg (ref 26.0–34.0)
MCHC: 33.4 g/dL (ref 30.0–36.0)
MCV: 90.2 fL (ref 80.0–100.0)
Monocytes Absolute: 0.5 10*3/uL (ref 0.1–1.0)
Monocytes Relative: 11 %
Neutro Abs: 2.5 10*3/uL (ref 1.7–7.7)
Neutrophils Relative %: 57 %
Platelets: 172 10*3/uL (ref 150–400)
RBC: 4.88 MIL/uL (ref 4.22–5.81)
RDW: 12.5 % (ref 11.5–15.5)
WBC: 4.3 10*3/uL (ref 4.0–10.5)
nRBC: 0 % (ref 0.0–0.2)

## 2020-08-22 LAB — COMPREHENSIVE METABOLIC PANEL
ALT: 14 U/L (ref 0–44)
AST: 18 U/L (ref 15–41)
Albumin: 4.2 g/dL (ref 3.5–5.0)
Alkaline Phosphatase: 49 U/L (ref 38–126)
Anion gap: 3 — ABNORMAL LOW (ref 5–15)
BUN: 9 mg/dL (ref 6–20)
CO2: 27 mmol/L (ref 22–32)
Calcium: 9.5 mg/dL (ref 8.9–10.3)
Chloride: 108 mmol/L (ref 98–111)
Creatinine, Ser: 0.96 mg/dL (ref 0.61–1.24)
GFR, Estimated: >60 mL/min (ref >60)
Glucose, Bld: 98 mg/dL (ref 70–99)
Potassium: 3.7 mmol/L (ref 3.5–5.1)
Sodium: 138 mmol/L (ref 135–145)
Total Bilirubin: 1.8 mg/dL — ABNORMAL HIGH (ref 0.3–1.2)
Total Protein: 6.9 g/dL (ref 6.5–8.1)

## 2020-08-22 NOTE — ED Provider Notes (Signed)
[facility] EMERGENCY DEPARTMENT Provider Note   CSN: [id number] Arrival date & time: 08/22/20  9458     History Chief Complaint  Patient presents with  . Seizures    Franklin [name] is a 40 y.o. male.  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  Patient states he had been on medication for this but has stopped it sometime ago.  Last episode of similar activity was 2 weeks ago.  He denies any tongue injury or loss of bladder function.  He called EMS and patient was initially groggy but then quickly was back to his baseline.  Patient CBG was appropriate.  He denies any recent use of alcohol or illicit drugs.  States when he fell he did strike his head.  Complains of head and neck pain without upper or lower extremity weakness.  Was transported for further evaluation        Past Medical History:  Diagnosis Date  . Anxiety   . Bipolar 1 disorder ([place])   . Chronic headaches   . Conversion disorder with seizures or convulsions   . 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 Depression   . Dizziness   . Hx of electroencephalogram 12/2014   normal  . Insomnia   . Mild cognitive impairment   . Psychogenic nonepileptic seizure   . Schizophrenia ([place])   . Schizophrenic disorder ([place])   . Seizure-like activity ([place])   . Seizures [facility])     Patient Active Problem List   Diagnosis Date Noted  . Migraine without aura and without status migrainosus, not intractable 05/22/2018  . Vertigo 05/22/2018  . Psychogenic nonepileptic seizure 02/18/2017  . Cholelithiasis 01/24/2017  . 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 Spells of decreased attentiveness 03/16/2016  . Abnormal EKG 11/30/2015  . Elevated troponin 11/30/2015  . Bipolar affective disorder, most recent episode unspecified type, remission  status unspecified 03/11/2015  . Bipolar affective disorder ([place]) 01/08/2015  . Generalized idiopathic epilepsy and epileptic syndromes, without status epilepticus, not intractable ([place]) 11/27/2014  . Seizure disorder ([place]) 10/14/2014    History reviewed. No pertinent surgical history.     Family History  Problem Relation Age of Onset  . Kidney failure Mother   . Heart disease Mother     Social History   Tobacco Use  . Smoking status: Never Smoker  . Smokeless tobacco: Never Used  Vaping Use  . Vaping Use: Never used  Substance Use Topics  . Alcohol use: Yes    Comment: special occassions  . Drug use: Never    Home Medications Prior to Admission medications   Medication Sig Start Date End Date Taking? Authorizing Provider  acetaminophen (TYLENOL) 500 MG tablet Take 1 tablet (500 mg total) by mouth every 6 (six) hours as needed. 04/28/20   Varney Biles, MD  albuterol (PROAIR HFA) 108 (90 Base) MCG/ACT inhaler Inhale 1-2 puffs into the lungs every 6 (six) hours as needed for wheezing or shortness of breath. 01/02/20   Zigmund Gottron, NP  atorvastatin (LIPITOR) 10 MG tablet Take 10 mg by mouth daily. 02/26/20   [provider]  famotidine (PEPCID) 20 MG tablet Take 1 tablet (20 mg total) by mouth 2 (two) times daily for 7 days. Patient taking differently: Take 20 mg by mouth 2 (two) times daily as needed for heartburn.  12/29/19 04/28/20  Darr, Edison Nasuti, PA-C  ibuprofen (ADVIL) 600 MG tablet Take 1 tablet (600 mg total) by mouth every 6 (six) hours as needed. 04/28/20   Varney Biles, MD  lamoTRIgine (LAMICTAL) 100 MG tablet Take 100 mg by mouth daily.    [provider]  lamoTRIgine (LAMICTAL) 25 MG tablet Take 25 mg by mouth 2 (two) times daily. 03/20/20   [provider]  Lumateperone Tosylate (CAPLYTA) 42 MG CAPS Take 42 mg by mouth daily.    [provider]  meclizine (ANTIVERT) 25 MG tablet Take 1 tablet (25 mg total) by mouth 3 (three)  times daily as needed for dizziness. 08/08/20   Alroy Bailiff, Margaux, PA-C  methocarbamol (ROBAXIN) 500 MG tablet Take 1 tablet (500 mg total) by mouth 2 (two) times daily. 04/28/20   Varney Biles, MD  naproxen (NAPROSYN) 500 MG tablet Take 1 tablet (500 mg total) by mouth 2 (two) times daily with a meal. Patient not taking: Reported on 04/28/2020 03/04/20   Jaynee Eagles, PA-C  ondansetron (ZOFRAN) 4 MG tablet Take 1 tablet (4 mg total) by mouth every 8 (eight) hours as needed for up to 15 doses for nausea or vomiting. 04/09/20   Curatolo, Adam, DO  sulfamethoxazole-trimethoprim (BACTRIM DS) 800-160 MG tablet Take 1 tablet by mouth 2 (two) times daily. Patient not taking: Reported on 04/28/2020 03/04/20   Jaynee Eagles, PA-C  traMADol (ULTRAM) 50 MG tablet Take 1 tablet (50 mg total) by mouth every 6 (six) hours as needed. Patient not taking: Reported on 04/28/2020 03/09/20   Delia Heady, PA-C  diphenhydrAMINE (BENADRYL) 25 MG tablet Take 2 tablets (50 mg total) by mouth at bedtime as needed for up to 7 days. 12/29/19 02/15/20  Darr, Edison Nasuti, PA-C  loratadine (CLARITIN) 10 MG tablet Take 1 tablet (10 mg total) by mouth daily. Patient not taking: Reported on 10/11/2019 09/18/19 12/02/19  Charlott Rakes, MD  Oxcarbazepine (TRILEPTAL) 300 MG tablet Take 1/2 tablet twice a day Patient not taking: Reported on 07/08/2019 01/26/18 07/09/19  Argentina Donovan, PA-C    Allergies    Coconut oil, Other, Codeine, Depakote [divalproex sodium], Hydroxyzine, Penicillins, Tape, Keflex [cephalexin], Latex, and Levetiracetam  Review of Systems   Review of Systems  All other systems reviewed and are negative.   Physical Exam Updated Vital Signs BP 119/83   Pulse (!) 54   Temp 98.2 F (36.8 C) (Oral)   Resp 16   Ht 1.829 m (6')   Wt 70.3 kg   SpO2 97%   BMI 21.02 kg/m   Physical Exam Vitals and nursing note reviewed.  Constitutional:      General: He is not in acute distress.    Appearance: Normal appearance. He  is well-developed. He is not toxic-appearing.  HENT:     Head: Normocephalic and atraumatic.  Eyes:     General: Lids are normal.     Conjunctiva/sclera: Conjunctivae normal.     Pupils: Pupils are equal, round, and reactive to light.  Neck:     Thyroid: No thyroid mass.     Trachea: No tracheal deviation.  Cardiovascular:     Rate and Rhythm: Normal rate and regular rhythm.     Heart sounds: Normal heart sounds. No murmur heard. No gallop.   Pulmonary:     Effort: Pulmonary effort is normal. No respiratory distress.     Breath sounds: Normal breath sounds. No stridor. No decreased breath sounds, wheezing, rhonchi or rales.  Abdominal:  General: Bowel sounds are normal. There is no distension.     Palpations: Abdomen is soft.     Tenderness: There is no abdominal tenderness. There is no rebound.  Musculoskeletal:        General: No tenderness. Normal range of motion.     Cervical back: Normal range of motion and neck supple.  Skin:    General: Skin is warm and dry.     Findings: No abrasion or rash.  Neurological:     Mental Status: He is alert and oriented to person, place, and time.     GCS: GCS eye subscore is 4. GCS verbal subscore is 5. GCS motor subscore is 6.     Cranial Nerves: No cranial nerve deficit.     Sensory: No sensory deficit.  Psychiatric:        Speech: Speech normal.        Behavior: Behavior normal.     ED Results / Procedures / Treatments   Labs (all labs ordered are listed, but only abnormal results are displayed) Labs Reviewed  CBC WITH DIFFERENTIAL/PLATELET  COMPREHENSIVE METABOLIC PANEL    EKG EKG Interpretation  Date/Time:  Friday August 22 2020 07:17:54 EDT Ventricular Rate:  50 PR Interval:    QRS Duration: 120 QT Interval:  422 QTC Calculation: 385 R Axis:   -8 Text Interpretation: Sinus rhythm Nonspecific intraventricular conduction delay ST elev, probable normal early repol pattern No significant change since last tracing  Confirmed by Lacretia Leigh ([id number]) on 08/22/2020 7:29:56 AM   Radiology No results found.  Procedures Procedures   Medications Ordered in ED Medications - No data to display  ED Course  I have reviewed the triage vital signs and the nursing notes.  Pertinent labs & imaging results that were available during my care of the patient were reviewed by me and considered in my medical decision making (see chart for details).    MDM Rules/Calculators/A&P                          Patient's head CT and neck CT without acute findings.  Labs are reassuring.  Will discharge home Final Clinical Impression(s) / ED Diagnoses Final diagnoses:  None    Rx / DC Orders ED Discharge Orders    None       Lacretia Leigh, MD 08/22/20 [phone]

## 2020-08-22 NOTE — ED Triage Notes (Signed)
Pt arrived via [facility] from home. Per EMS, pt told them he had 3 seizures and the last one he fell off the bed and hit the posterior of his head on the dresser. Per EMS, pt refused c-collar and IV for them. Per EMS pt was confused on scene, but then became A&Ox4. Pt c/o 8/10 sharp pain in posterior of head. Pt is A&Ox4. VSS

## 2020-08-22 NOTE — ED Notes (Signed)
Patient transported to CT 

## 2020-08-29 ENCOUNTER — Ambulatory Visit: Payer: Medicare Other | Admitting: Cardiovascular Disease

## 2020-09-01 ENCOUNTER — Other Ambulatory Visit: Payer: Self-pay

## 2020-09-01 ENCOUNTER — Ambulatory Visit (HOSPITAL_COMMUNITY)
Admission: EM | Admit: 2020-09-01 | Discharge: 2020-09-01 | Disposition: A | Discharge status: Home / Self Care | Payer: Medicare Other

## 2020-09-10 ENCOUNTER — Other Ambulatory Visit: Payer: Self-pay

## 2020-09-10 ENCOUNTER — Encounter: Payer: Self-pay | Admitting: Diagnostic Neuroimaging

## 2020-09-10 ENCOUNTER — Telehealth: Payer: Self-pay | Admitting: Diagnostic Neuroimaging

## 2020-09-10 ENCOUNTER — Ambulatory Visit (INDEPENDENT_AMBULATORY_CARE_PROVIDER_SITE_OTHER): Payer: Medicare Other | Admitting: Diagnostic Neuroimaging

## 2020-09-10 VITALS — BP 128/79 | HR 65 | Ht 72.0 in | Wt 164.4 lb

## 2020-09-10 DIAGNOSIS — R42 Dizziness and giddiness: Secondary | ICD-10-CM

## 2020-09-10 DIAGNOSIS — F445 Conversion disorder with seizures or convulsions: Secondary | ICD-10-CM

## 2020-09-10 NOTE — Telephone Encounter (Signed)
[employer] medicare order sent to GI. They will reach out to the patient to schedule.

## 2020-09-10 NOTE — Progress Notes (Signed)
[facility]  PATIENT: Franklin [name] DOB: [birth year] see other record for Franklin [name] (MRNs [id number] and [id number]) --> needs record merge  REFERRING CLINICIAN: Cipriano Mile, NP HISTORY FROM: patient  REASON FOR VISIT: follow up / new issue   HISTORICAL  CHIEF COMPLAINT:  Chief Complaint  Patient presents with  . Dizziness    Rm 7 "still having dizziness;  Meclizine makes it worse, makes room spin"    HISTORY OF PRESENT ILLNESS:   UPDATE (09/10/20, VRP): Since last visit, having intermittent lightheadedness / dizziness / vertigo (with standing; sometimes assoc with low sugar or low heart rate; lasts up to 45 minutes). Has been to ER several times for this. Symptoms are now resolved. Tried meclizine without relief.   UPDATE (04/01/20, VRP): 40 year old male with history of bipolar disorder, schizophrenia, here for evaluation of abnormal spells.  Patient has had episodes of staring, convulsions, unresponsiveness since age 19 years old.  He was evaluated by neurology in 2018 by Dr Delice Lesch, and diagnosed with nonepileptic spells.  He had [name] monitoring and EEGs which confirmed this diagnosis.  He was dismissed from that practice due to behavior and then established with another neurologist Dr. Doy Hutching.  She also confirmed the diagnosis of psychogenic nonepileptic seizures and recommended follow-up with psychiatry.  Patient followed up with PCP and he requested third opinion neurology consult with [place] today.  Patient continues to have similar spells.  He is here alone.  He is not sure what medicines he is tried in the past.  Urgently I was able to pull up his records in epic system although his medical records are listed under 2 medical record numbers.  He states he is not seeing psychiatry currently.   (Copied from note 01/18/20, Dr. Doy Hutching):  The patient is a42 y.o.right handedBlack or African AmericanMaleWith history of generalized seizures, probablymost if not  all arepseudoseizures.No tongue biting/ incontinence, not in sleep, may be brought on by stress.Can't take Keppra/depakote/lamictal/tegretol. Has been noncompliant with sz meds. Seeing Psychiatry ([facility] in [place]). Memoryloss,not severe. Head CT was normal3years ago.  Saw video of spell, was unresponsive but no shaking or posturing.Gets HAs/dizziness after spells. He has been to ER several times with syncope/seizure-like activity.  Has not had MRI. Migraines not bad, not frequent.   (Copied from note 04/18/17, Dr. Delice Lesch): HISTORY OF PRESENT ILLNESS I had the pleasure of seeing [name] in follow-up in the neurology clinic on 04/19/2017. 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 He is accompanied by his wife who helps supplement the history today. Since his last visit, there have been 5 ER visits for seizures. He was in the ER on 11/7, then again on 11/8 for back to back seizures. It was noted that his head and eyes turned to the left, then with administration of ammonia, patient jerked his head to the right and blinked and briefly moved all 4 extremities. On removal of ammonia, he returns his gaze to the left with frequent blinking and tearing. His wife reports that the seizures she has witnessed consists of patient blanking out, he does not talk, then he "starts shaking all over the place." Sometimes his eyes are closed. If they are open, he is just dazed out of it. He was monitored at [facility] for several hours, but he became progressively agitated and uncooperative. He did have 3 typical spells confirmed by his cousin as typical events. He was kicking his legs, unresponsive, staring off,  flailing around the bed during one event. The second episode he was noted to have right hand shaking, trying to get out of bed, fidgeting around the bed. The third push button was for fighting the nurses and getting agitated, pulling his IV out. All three  episodes did not show any EEG correlate. It was noted that it is unclear whether all his spells are non-epileptic, however baseline uncontrolled psychosis made him unable to comply with [name] monitoring to continue testing. He now follows with [facility] for psychiatry and therapy, and is taking Prozac. He is on Oxcarbazepine 150mg  BID for mood stabilization (he reported pain on 300mg  BID).  HPI: This is a 40 yo RH man with a history of bipolar disorder, mild cognitive impairment, and seizures since age 41 or 87. He and his cousin both report that seizures would start with staring and unresponsiveness, followed by low amplitude shaking of both hands. Sometimes he would have violent leg kicking. They state that shaking last from 30 minutes up to "1-1/2 hours" followed by confusion for around 20 minutes. He would be sleepy after a seizure. He has had urinary incontinence with some of them, no tongue bite or other significant injuries. He reports all seizures occur during wakefulness, no nocturnal seizures that he is aware of, but woke up one time with urinary incontinence. He denies any olfactory/gustatory hallucinations, deja vu, rising epigastric sensation, focal numbness/tingling/weakness, myoclonic jerks. He occasionally drops things from his hands. He reports that Depakote was started at age 39, however he has only been taking 250mg /day, stating that when he was on 500 or 750mg , he would be "like a zombie, walking into doors." The low dose Depakote has not controlled the seizures, he reports seizures occurring every 3-4 days, longest seizure-free interval probably 1-2 months.   On review of records on EPIC, he has been to the ER several times since 2008 with no note of a seizure history until 2014. There are some records of patient being on Depakote, Tegretol, and Haldol, presumably for Bipolar disorder. He reports chronic headaches occurring 2-3 times a week with sharp pain in the back of his head, "like a  needle sticking in it" lasting 30-45 minutes. He would feel lightheaded. No associated nausea, vomiting, photo/phonophobia, or visual obscurations. He takes Tylenol or aspirin with good effect. He lives with his girlfriend. He finished 12th grade in special education classes. He does not drive.  Epilepsy Risk Factors: His maternal uncle used to have seizures. He was born premature, with mild cognitive impairment. Otherwise he denies any history of febrile convulsions, CNS infections such as meningitis/encephalitis, significant traumatic brain injury, neurosurgical procedures.   REVIEW OF SYSTEMS: Full 14 system review of systems performed and negative with exception of: as per HPI.   ALLERGIES: Allergies  Allergen Reactions  . Coconut Oil Anaphylaxis and Nausea And Vomiting    Reaction to any kind of coconut  . Other Anaphylaxis    Raisins  . Codeine Nausea And Vomiting  . Depakote [Divalproex Sodium] Other (See Comments)    Pt states that this medication makes him feel like he is "out of it"  . Hydroxyzine Other (See Comments)    Other reaction(s): Dizziness (intolerance) "passed out"  . Penicillins Nausea And Vomiting    Has patient had a PCN reaction causing immediate rash, facial/tongue/throat swelling, SOB or lightheadedness with hypotension: No Has patient had a PCN reaction causing severe rash involving mucus membranes or skin necrosis: No Has patient had a PCN reaction that  required hospitalization No Has patient had a PCN reaction occurring within the last 10 years: No If all of the above answers are "NO", then may proceed with Cephalosporin use.    . Tape Other (See Comments)    TEARS THE SKIN; only "paper tape" is tolerated  . Keflex [Cephalexin] Itching  . Latex Rash  . Levetiracetam Nausea And Vomiting    HOME MEDICATIONS: Outpatient Medications Prior to Visit  Medication Sig Dispense Refill  . atorvastatin (LIPITOR) 10 MG tablet Take 10 mg by mouth daily.    Marland Kitchen  lamoTRIgine (LAMICTAL) 100 MG tablet Take 100 mg by mouth daily.    . meclizine (ANTIVERT) 25 MG tablet Take 1 tablet (25 mg total) by mouth 3 (three) times daily as needed for dizziness. 30 tablet 0  . acetaminophen (TYLENOL) 500 MG tablet Take 1 tablet (500 mg total) by mouth every 6 (six) hours as needed. (Patient not taking: Reported on 09/10/2020) 30 tablet 0  . albuterol (PROAIR HFA) 108 (90 Base) MCG/ACT inhaler Inhale 1-2 puffs into the lungs every 6 (six) hours as needed for wheezing or shortness of breath. (Patient not taking: Reported on 09/10/2020) 8 g 0  . famotidine (PEPCID) 20 MG tablet Take 1 tablet (20 mg total) by mouth 2 (two) times daily for 7 days. (Patient taking differently: Take 20 mg by mouth 2 (two) times daily as needed for heartburn. ) 14 tablet 0  . ibuprofen (ADVIL) 600 MG tablet Take 1 tablet (600 mg total) by mouth every 6 (six) hours as needed. (Patient not taking: Reported on 09/10/2020) 30 tablet 0  . lamoTRIgine (LAMICTAL) 25 MG tablet Take 25 mg by mouth 2 (two) times daily. (Patient not taking: Reported on 09/10/2020)    . Lumateperone Tosylate (CAPLYTA) 42 MG CAPS Take 42 mg by mouth daily. (Patient not taking: Reported on 09/10/2020)    . methocarbamol (ROBAXIN) 500 MG tablet Take 1 tablet (500 mg total) by mouth 2 (two) times daily. (Patient not taking: Reported on 09/10/2020) 10 tablet 0  . naproxen (NAPROSYN) 500 MG tablet Take 1 tablet (500 mg total) by mouth 2 (two) times daily with a meal. (Patient not taking: No sig reported) 30 tablet 0  . ondansetron (ZOFRAN) 4 MG tablet Take 1 tablet (4 mg total) by mouth every 8 (eight) hours as needed for up to 15 doses for nausea or vomiting. (Patient not taking: Reported on 09/10/2020) 15 tablet 0  . sulfamethoxazole-trimethoprim (BACTRIM DS) 800-160 MG tablet Take 1 tablet by mouth 2 (two) times daily. (Patient not taking: No sig reported) 20 tablet 0  . traMADol (ULTRAM) 50 MG tablet Take 1 tablet (50 mg total) by mouth  every 6 (six) hours as needed. (Patient not taking: No sig reported) 5 tablet 0   No facility-administered medications prior to visit.    PAST MEDICAL HISTORY: Past Medical History:  Diagnosis Date  . Anxiety   . Bipolar 1 disorder ([place])   . Chronic headaches   . Conversion disorder with seizures or convulsions   . Convulsion, non-epileptic [facility])    "raises the possibility of non-epileptic events" per Neuro MD office note 03/2015  . Depression   . Dizziness   . Hx of electroencephalogram 12/2014   normal  . Insomnia   . Mild cognitive impairment   . Psychogenic nonepileptic seizure   . Schizophrenia ([place])   . Schizophrenic disorder ([place])   . Seizure-like activity ([place])   . Seizures ([place])     PAST  SURGICAL HISTORY: No past surgical history on file.  FAMILY HISTORY: Family History  Problem Relation Age of Onset  . Kidney failure Mother   . Heart disease Mother     SOCIAL HISTORY: Social History   Socioeconomic History  . Marital status: Married    Spouse name: Angie  . Number of children: 0  . Years of education: Not on file  . Highest education level: Not on file  Occupational History    Comment: disability part time at [employer]  Tobacco Use  . Smoking status: Never Smoker  . Smokeless tobacco: Never Used  Vaping Use  . Vaping Use: Never used  Substance and Sexual Activity  . Alcohol use: Yes    Comment: special occassions  . Drug use: Never  . Sexual activity: Not on file  Other Topics Concern  . Not on file  Social History Narrative   ** Merged History Encounter **       Lives with wife Sodas, 5 day   Social Determinants of Health   Financial Resource Strain: Not on file  Food Insecurity: Not on file  Transportation Needs: Not on file  Physical Activity: Not on file  Stress: Not on file  Social Connections: Not on file  Intimate Partner Violence: Not on file     PHYSICAL EXAM  GENERAL EXAM/CONSTITUTIONAL: Vitals:  Vitals:    09/10/20 1314  BP: 128/79  Pulse: 65  Weight: 164 lb 6.4 oz (74.6 kg)  Height: 6' (1.829 m)   Body mass index is 22.3 kg/m. Wt Readings from Last 3 Encounters:  09/10/20 164 lb 6.4 oz (74.6 kg)  08/22/20 154 lb 15.7 oz (70.3 kg)  06/10/20 155 lb (70.3 kg)    Patient is in no distress; well developed, nourished and groomed; neck is supple  CARDIOVASCULAR:  Examination of carotid arteries is normal; no carotid bruits  Regular rate and rhythm, no murmurs  Examination of peripheral vascular system by observation and palpation is normal  EYES:  Ophthalmoscopic exam of optic discs and posterior segments is normal; no papilledema or hemorrhages No exam data present  MUSCULOSKELETAL:  Gait, strength, tone, movements noted in Neurologic exam below  NEUROLOGIC: MENTAL STATUS:  No flowsheet data found.  awake, alert, oriented to person, place and time  recent and remote memory intact  normal attention and concentration  language fluent, comprehension intact, naming intact  fund of knowledge appropriate  CRANIAL NERVE:   2nd - no papilledema on fundoscopic exam  2nd, 3rd, 4th, 6th - pupils equal and reactive to light, visual fields full to confrontation, extraocular muscles intact, no nystagmus  5th - facial sensation symmetric  7th - facial strength symmetric  8th - hearing intact  9th - palate elevates symmetrically, uvula midline  11th - shoulder shrug symmetric  12th - tongue protrusion midline  MOTOR:   normal bulk and tone, full strength in the BUE, BLE  SENSORY:   normal and symmetric to light touch, temperature, vibration  COORDINATION:   finger-nose-finger, fine finger movements normal  REFLEXES:   deep tendon reflexes present and symmetric  GAIT/STATION:   narrow based gait     DIAGNOSTIC DATA (LABS, IMAGING, TESTING) - I reviewed patient records, labs, notes, testing and imaging myself where available.  Lab Results   Component Value Date   WBC 4.3 08/22/2020   HGB 14.7 08/22/2020   HCT 44.0 08/22/2020   MCV 90.2 08/22/2020   PLT 172 08/22/2020      Component  Value Date/Time   NA 138 08/22/2020 0733   K 3.7 08/22/2020 0733   CL 108 08/22/2020 0733   CO2 27 08/22/2020 0733   GLUCOSE 98 08/22/2020 0733   BUN 9 08/22/2020 0733   CREATININE 0.96 08/22/2020 0733   CREATININE 0.84 10/14/2014 1445   CALCIUM 9.5 08/22/2020 0733   PROT 6.9 08/22/2020 0733   ALBUMIN 4.2 08/22/2020 0733   AST 18 08/22/2020 0733   ALT 14 08/22/2020 0733   ALKPHOS 49 08/22/2020 0733   BILITOT 1.8 (H) 08/22/2020 0733   GFRNONAA >60 08/22/2020 0733   GFRNONAA >89 10/14/2014 1445   GFRAA >60 02/15/2020 0641   GFRAA >89 10/14/2014 1445   Lab Results  Component Value Date   CHOL 168 10/14/2014   HDL 44 10/14/2014   LDLCALC 113 (H) 10/14/2014   TRIG 53 10/14/2014   CHOLHDL 3.8 10/14/2014   Lab Results  Component Value Date   HGBA1C 5.1 05/03/2016   No results found for: [id number] Lab Results  Component Value Date   TSH 1.396 08/08/2020    01/06/15 EEG - A normal EEG does not exclude a clinical diagnosis of epilepsy.  If further clinical questions remain, prolonged EEG may be helpful.  Clinical correlation is advised.  03/07/19 EEG - Probably normal 24 hr Ambulatory EEG in waking, drowsiness and sleep performed with ST leads. - Two of 5 shaking spells showed some rhythmic slowing and small spike-like waves which are probably artifactual. No definite seizures. - Total recording time: 24 hrs, 26 minutes.  03/18/16 CT head  - unremarkable  08/22/20 CT head  - No evidence of intracranial or cervical spine injury.    ASSESSMENT AND PLAN  40 y.o. year old male here with:    Dx:  1. Dizziness   2. Psychogenic nonepileptic seizure      PLAN:  DIZZINESS / lightheadedness / vertigo / bradycardia - could be BPV, but history unclear - will check MRI brain to rule out other causes - follow up with PCP  and cardiology  NON-EPILEPTIC SPELLS / PSEUDOSEIZURES (since age 55 years old) - diagnosis has been established by Dr. Delice Lesch [facility] Neurology) and Dr. Doy Hutching [facility] neurology) in the past - follow up with PCP and psychiatry  Orders Placed This Encounter  Procedures  . MR BRAIN W WO CONTRAST   Return for return to PCP, pending if symptoms worsen or fail to improve.    Penni Bombard, MD 4/68/0321, 2:24 PM Certified in Neurology, Neurophysiology and Neuroimaging  [facility] [address] [place], [place] [address] [phone]

## 2020-09-21 ENCOUNTER — Other Ambulatory Visit: Payer: Medicare Other

## 2020-10-03 ENCOUNTER — Ambulatory Visit: Payer: Medicare Other | Admitting: Cardiovascular Disease

## 2020-10-28 ENCOUNTER — Other Ambulatory Visit: Payer: Self-pay

## 2020-10-28 ENCOUNTER — Emergency Department (HOSPITAL_COMMUNITY)
Admission: EM | Admit: 2020-10-28 | Discharge: 2020-10-28 | Discharge status: Against Medical Advice | Payer: Medicare Other | Attending: Emergency Medicine | Admitting: Emergency Medicine

## 2020-10-28 ENCOUNTER — Encounter (HOSPITAL_COMMUNITY): Payer: Self-pay | Admitting: Emergency Medicine

## 2020-10-28 ENCOUNTER — Emergency Department (HOSPITAL_COMMUNITY)
Admission: EM | Admit: 2020-10-28 | Discharge: 2020-10-29 | Disposition: A | Discharge status: Home / Self Care | Payer: Medicare Other | Source: Home / Self Care

## 2020-10-28 ENCOUNTER — Emergency Department (HOSPITAL_COMMUNITY): Payer: Medicare Other

## 2020-10-28 DIAGNOSIS — M25552 Pain in left hip: Secondary | ICD-10-CM | POA: Diagnosis not present

## 2020-10-28 DIAGNOSIS — R200 Anesthesia of skin: Secondary | ICD-10-CM | POA: Insufficient documentation

## 2020-10-28 DIAGNOSIS — Z5321 Procedure and treatment not carried out due to patient leaving prior to being seen by health care provider: Secondary | ICD-10-CM | POA: Insufficient documentation

## 2020-10-28 DIAGNOSIS — M79605 Pain in left leg: Secondary | ICD-10-CM | POA: Insufficient documentation

## 2020-10-28 NOTE — ED Notes (Addendum)
Pt no longer wants to wait and left

## 2020-10-28 NOTE — ED Provider Notes (Signed)
Emergency Medicine Provider Triage Evaluation Note  Franklin [name] , a 40 y.o. male  was evaluated in triage.  Pt complains of L hip pain for the past few days. No injuries or trauma. Has tried OTC meds without improvement. Worse with ambulation. Has been walking with a limp at work.  Review of Systems  Positive: L hip pain Negative: numbness  Physical Exam  BP (!) 137/105   Pulse (!) 53   Temp 98.5 F (36.9 C) (Oral)   Resp 18   SpO2 100%  Gen:   Awake, no distress   Resp:  Normal effort  MSK:   Moves extremities without difficulty  Other:  TTP of L hip  Medical Decision Making  Medically screening exam initiated at 9:50 PM.  Appropriate orders placed.  Celso Sickle was informed that the remainder of the evaluation will be completed by another provider, this initial triage assessment does not replace that evaluation, and the importance of remaining in the ED until their evaluation is complete.  Xray ordered   Delia Heady, PA-C 10/28/20 2151    Wyvonnia Dusky, MD 10/29/20 1106

## 2020-10-28 NOTE — ED Triage Notes (Signed)
Pt states he has new onset hip pain in the left hip. Pt states he has taken tylenol, ibuprofen, and tried resting, but nothing helps. Pt denies any fall or injury to his hip.

## 2020-10-28 NOTE — ED Triage Notes (Signed)
Pt to triage via [facility] bus [place] after getting off work.  C/o severe pain and numbness to L leg x 2 days. No known injury.  Denies back pain.

## 2020-10-29 NOTE — ED Notes (Signed)
Pt called from triage for a room with no answer and pt not seen in the lobby

## 2020-10-29 NOTE — ED Notes (Signed)
Attempted to call pt to update vital signs. No answer.

## 2020-10-30 ENCOUNTER — Other Ambulatory Visit: Payer: Self-pay | Admitting: Student

## 2020-10-30 DIAGNOSIS — M5442 Lumbago with sciatica, left side: Secondary | ICD-10-CM

## 2020-11-04 ENCOUNTER — Ambulatory Visit (HOSPITAL_COMMUNITY): Payer: Medicare Other

## 2020-11-04 ENCOUNTER — Encounter (HOSPITAL_COMMUNITY): Payer: Self-pay | Admitting: Emergency Medicine

## 2020-11-04 ENCOUNTER — Emergency Department (HOSPITAL_COMMUNITY)
Admission: EM | Admit: 2020-11-04 | Discharge: 2020-11-04 | Discharge status: Against Medical Advice | Payer: Medicare Other | Attending: Emergency Medicine | Admitting: Emergency Medicine

## 2020-11-04 DIAGNOSIS — R0989 Other specified symptoms and signs involving the circulatory and respiratory systems: Secondary | ICD-10-CM | POA: Diagnosis not present

## 2020-11-04 DIAGNOSIS — Z5321 Procedure and treatment not carried out due to patient leaving prior to being seen by health care provider: Secondary | ICD-10-CM | POA: Insufficient documentation

## 2020-11-04 DIAGNOSIS — R109 Unspecified abdominal pain: Secondary | ICD-10-CM | POA: Insufficient documentation

## 2020-11-04 DIAGNOSIS — R0602 Shortness of breath: Secondary | ICD-10-CM | POA: Diagnosis not present

## 2020-11-04 DIAGNOSIS — R059 Cough, unspecified: Secondary | ICD-10-CM | POA: Insufficient documentation

## 2020-11-04 DIAGNOSIS — R197 Diarrhea, unspecified: Secondary | ICD-10-CM | POA: Diagnosis not present

## 2020-11-04 LAB — COMPREHENSIVE METABOLIC PANEL
ALT: 26 U/L (ref 0–44)
AST: 23 U/L (ref 15–41)
Albumin: 4.5 g/dL (ref 3.5–5.0)
Alkaline Phosphatase: 57 U/L (ref 38–126)
Anion gap: 8 (ref 5–15)
BUN: 15 mg/dL (ref 6–20)
CO2: 27 mmol/L (ref 22–32)
Calcium: 9.6 mg/dL (ref 8.9–10.3)
Chloride: 102 mmol/L (ref 98–111)
Creatinine, Ser: 1.04 mg/dL (ref 0.61–1.24)
GFR, Estimated: >60 mL/min (ref >60)
Glucose, Bld: 97 mg/dL (ref 70–99)
Potassium: 3.8 mmol/L (ref 3.5–5.1)
Sodium: 137 mmol/L (ref 135–145)
Total Bilirubin: 1.1 mg/dL (ref 0.3–1.2)
Total Protein: 7.7 g/dL (ref 6.5–8.1)

## 2020-11-04 LAB — CBC WITH DIFFERENTIAL/PLATELET
Abs Immature Granulocytes: 0.03 10*3/uL (ref 0.00–0.07)
Basophils Absolute: 0 10*3/uL (ref 0.0–0.1)
Basophils Relative: 0 %
Eosinophils Absolute: 0.1 10*3/uL (ref 0.0–0.5)
Eosinophils Relative: 0 %
HCT: 44.1 % (ref 39.0–52.0)
Hemoglobin: 14.8 g/dL (ref 13.0–17.0)
Immature Granulocytes: 0 %
Lymphocytes Relative: 13 %
Lymphs Abs: 1.8 10*3/uL (ref 0.7–4.0)
MCH: 29.8 pg (ref 26.0–34.0)
MCHC: 33.6 g/dL (ref 30.0–36.0)
MCV: 88.9 fL (ref 80.0–100.0)
Monocytes Absolute: 0.9 10*3/uL (ref 0.1–1.0)
Monocytes Relative: 6 %
Neutro Abs: 11.1 10*3/uL — ABNORMAL HIGH (ref 1.7–7.7)
Neutrophils Relative %: 81 %
Platelets: 192 10*3/uL (ref 150–400)
RBC: 4.96 MIL/uL (ref 4.22–5.81)
RDW: 11.9 % (ref 11.5–15.5)
WBC: 13.8 10*3/uL — ABNORMAL HIGH (ref 4.0–10.5)
nRBC: 0 % (ref 0.0–0.2)

## 2020-11-04 LAB — LIPASE, BLOOD: Lipase: 29 U/L (ref 11–51)

## 2020-11-04 NOTE — ED Triage Notes (Signed)
Pt came from [facility] via EMS. C/c: Runny nose, abd pain, 2 episodes of diarrhea. Pt states he felt a cold coming on since 1100 today. Wife called EMS d/t new onset of SO while purchasing cold medicine in [facility]. 98-100% on RA, pt appeared to be hyperventilating upon EMS arrival which has since resolved. Pt has a hx of pseudoseizures and psych issues.  134/76 80 bpm 115 CBG

## 2020-11-04 NOTE — ED Provider Notes (Signed)
Emergency Medicine Provider Triage Evaluation Note  Franklin [name] 40 y.o. male was evaluated in triage.  Pt complains of cough, SOB, abd pain, and diarrhea.  He states he has felt like he has had a cold coming on over the last few days.  He states he was at [facility] today and he started having some shortness of breath.  He has had cough as well.  He does report today, he had some abdominal pain, diarrhea.  No COVID exposure that he knows of.  No vomiting.  No fevers that he has measured.   Review of Systems  Positive: Cough, abd pain, diarrhea Negative: Fevers, vomiting  Physical Exam  BP 134/82   Pulse 70   Temp 98.2 F (36.8 C) (Oral)   Resp 18   Ht 5\' 4"  (1.626 m)   Wt 65.8 kg   SpO2 100%   BMI 24.89 kg/m  Gen:   Awake, no distress   HEENT:  Atraumatic  Resp:  Normal effort. Able to speak in full sentences without any respiratory distress.  Cardiac:  Normal rate. Abd:   Nondistended, nontender MSK:   Moves extremities without difficulty  Neuro:  Speech clear   Other:      Medical Decision Making  Medically screening exam initiated at 6:01 PM  Appropriate orders placed.  Celso Sickle was informed that the remainder of the evaluation will be completed by another provider, this initial triage assessment does not replace that evaluation. They are counseled that they will need to remain in the ED until the completion of their workup, including full H&P and results of any tests.  Risks of leaving the emergency department prior to completion of treatment were discussed. Patient was advised to inform ED staff if they are leaving before their treatment is complete. The patient acknowledged these risks and time was allowed for questions.     The patient appears stable so that the remainder of the MSE may be completed by another provider.   Clinical Impression  Cough    Portions of this note were generated with Dragon dictation software. Dictation errors may occur despite best  attempts at proofreading.      Volanda Napoleon, PA-C 11/04/20 1803    Milton Ferguson, MD 11/04/20 [phone]

## 2020-11-24 ENCOUNTER — Other Ambulatory Visit: Payer: Medicare Other

## 2020-12-02 ENCOUNTER — Ambulatory Visit: Payer: Medicare Other | Admitting: Cardiovascular Disease

## 2021-01-05 ENCOUNTER — Encounter (HOSPITAL_COMMUNITY): Payer: Self-pay | Admitting: Emergency Medicine

## 2021-01-05 ENCOUNTER — Other Ambulatory Visit: Payer: Self-pay

## 2021-01-05 ENCOUNTER — Ambulatory Visit (HOSPITAL_COMMUNITY)
Admission: EM | Admit: 2021-01-05 | Discharge: 2021-01-05 | Disposition: A | Discharge status: Home / Self Care | Payer: Medicare Other | Attending: Emergency Medicine | Admitting: Emergency Medicine

## 2021-01-05 DIAGNOSIS — S7002XA Contusion of left hip, initial encounter: Secondary | ICD-10-CM | POA: Diagnosis not present

## 2021-01-05 MED ORDER — MELOXICAM 15 MG PO TABS: 15.0000 mg | ORAL_TABLET | Freq: Every day | ORAL | 0 refills | Status: AC

## 2021-01-05 MED ORDER — TIZANIDINE HCL 4 MG PO TABS: 4.0000 mg | ORAL_TABLET | Freq: Three times a day (TID) | ORAL | 0 refills | Status: DC | PRN

## 2021-01-05 NOTE — Discharge Instructions (Addendum)
Mobic 15 mg once daily with breakfast.  Take Tylenol 1000 mg 3-4 times a day.  Ice for 10 to 15 minutes at a time.  Zanaflex for muscle spasms.

## 2021-01-05 NOTE — ED Provider Notes (Signed)
HPI  SUBJECTIVE:  Franklin [name] is a 40 y.o. male who presents with constant dull, sharp, achy, sore, throbbing left hip pain starting 3 days ago.  States that he was carrying a washer machine with another person, and the person dropped his into the washing machine.  Patient took full weight of the washing machine onto his outer hip.  He has been ambulatory on it since but reports significant pain with walking, hip flexion.  Not sure if there is any swelling.  He denies bruising.  He has been alternating 400 mg of Advil with 1000 mg of Tylenol 3 times daily with temporary improvement in symptoms.  Symptoms are worse with walking, getting up from sitting to standing, sitting for prolonged periods of time.  Last dose of Advil was yesterday.  He denies true leg weakness, distal numbness or tingling.  He has a past medical history of schizophrenia, bipolar disorder, seizures.  No history of diabetes, hypertension, chronic kidney disease, osteoporosis.  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 No pertinent surgical history.  Family History  Problem Relation Age of Onset   Kidney failure Mother    Heart disease Mother     Social History   Tobacco Use   Smoking status: Never   Smokeless tobacco: Never  Vaping Use   Vaping Use: Never used  Substance Use Topics   Alcohol use: Not Currently    Comment: special occassions   Drug use: Never    No current  facility-administered medications for this encounter.  Current Outpatient Medications:    meloxicam (MOBIC) 15 MG tablet, Take 1 tablet (15 mg total) by mouth daily for 7 days., Disp: 7 tablet, Rfl: 0   tiZANidine (ZANAFLEX) 4 MG tablet, Take 1 tablet (4 mg total) by mouth every 8 (eight) hours as needed for muscle spasms., Disp: 30 tablet, Rfl: 0   albuterol (PROAIR HFA) 108 (90 Base) MCG/ACT inhaler, Inhale 1-2 puffs into the lungs every 6 (six) hours as needed for wheezing or shortness of breath. (Patient not taking: Reported on 09/10/2020), Disp: 8 g, Rfl: 0   atorvastatin (LIPITOR) 10 MG tablet, Take 10 mg by mouth daily., Disp: , Rfl:    famotidine (PEPCID) 20 MG tablet, Take 1 tablet (20 mg total) by mouth 2 (two) times daily for 7 days. (Patient taking differently: Take 20 mg by mouth 2 (two) times daily as needed for heartburn. ), Disp: 14 tablet, Rfl: 0   lamoTRIgine (LAMICTAL) 100 MG tablet, Take 100 mg by mouth daily., Disp: , Rfl:    lamoTRIgine (LAMICTAL) 25 MG tablet, Take 25 mg by mouth 2 (two) times daily. (Patient not taking: Reported on 09/10/2020), Disp: , Rfl:    Lumateperone Tosylate (CAPLYTA) 42 MG CAPS, Take 42 mg by mouth daily. (Patient not taking: Reported on 09/10/2020), Disp: , Rfl:    meclizine (ANTIVERT)  25 MG tablet, Take 1 tablet (25 mg total) by mouth 3 (three) times daily as needed for dizziness., Disp: 30 tablet, Rfl: 0  Allergies  Allergen Reactions   Coconut Oil Anaphylaxis and Nausea And Vomiting    Reaction to any kind of coconut   Other Anaphylaxis    Raisins   Codeine Nausea And Vomiting   Depakote [Divalproex Sodium] Other (See Comments)    Pt states that this medication makes him feel like he is "out of it"   Hydroxyzine Other (See Comments)    Other reaction(s): Dizziness (intolerance) "passed out"   Penicillins Nausea And Vomiting    Has patient had a PCN reaction causing immediate rash, facial/tongue/throat swelling, SOB or lightheadedness with  hypotension: No Has patient had a PCN reaction causing severe rash involving mucus membranes or skin necrosis: No Has patient had a PCN reaction that required hospitalization No Has patient had a PCN reaction occurring within the last 10 years: No If all of the above answers are "NO", then may proceed with Cephalosporin use.     Tape Other (See Comments)    TEARS THE SKIN; only "paper tape" is tolerated   Keflex [Cephalexin] Itching   Latex Rash   Levetiracetam Nausea And Vomiting     ROS  As noted in HPI.   Physical Exam  BP 115/72 (BP Location: Right Arm)   Pulse (!) 52   Temp 98.2 F (36.8 C) (Oral)   Resp 20   SpO2 100%   Constitutional: Well developed, well nourished, no acute distress Eyes:  EOMI, conjunctiva normal bilaterally HENT: Normocephalic, atraumatic,mucus membranes moist Respiratory: Normal inspiratory effort Cardiovascular: Normal rate GI: nondistended skin: No rash, skin intact Musculoskeletal: Tender soft tissue swelling at the upper lateral left hip.  No ecchymosis.  Pain with hip flexion and abduction.  No pain with internal/external rotation of the hip, adduction. No bruising, erythema, rash. No tenderness over gluteal muscles, down IT band, quadriceps. Flexion/extension knee WNL. Knee joint NT, stable. Motor strength flexion/ext hip 5/5. Sensation to LT intact. DP 2+ patient ambulatory with a antalgic gait Neurologic: Alert & oriented x 3, no focal neuro deficits Psychiatric: Speech and behavior appropriate   ED Course   Medications - No data to display  No orders of the defined types were placed in this encounter.   No results found for this or any previous visit (from the past 24 hour(s)). No results found.  ED Clinical Impression  1. Contusion of left hip, initial encounter      ED Assessment/Plan  Patient is ambulatory.  Do not think that he has a hip or pelvic fracture.  Deferring imaging.  Presentation most consistent with a  contusion of the hip.  Will send him home on Mobic 15 mg daily since ibuprofen is not working.  Tylenol 1000 mg 3-4 times a day.  Ice.  Zanaflex.  Work note for 2 days.  Patient declined a shot of Toradol and Tylenol here.  Follow-up with sports medicine if not getting better in a week or 2.  Discussed MDM, treatment plan, and plan for follow-up with patient. patient agrees with plan.   Meds ordered this encounter  Medications   tiZANidine (ZANAFLEX) 4 MG tablet    Sig: Take 1 tablet (4 mg total) by mouth every 8 (eight) hours as needed for muscle spasms.    Dispense:  30 tablet    Refill:  0   meloxicam (MOBIC) 15 MG tablet    Sig: Take  1 tablet (15 mg total) by mouth daily for 7 days.    Dispense:  7 tablet    Refill:  0      *This clinic note was created using [employer]. Therefore, there may be occasional mistakes despite careful proofreading.  ?    Melynda Ripple, MD 01/05/21 1624

## 2021-01-05 NOTE — ED Triage Notes (Signed)
Hip pain started on Saturday.  Patient was moving a washing machine.  Other person dropped equipment, landed on this patient .  Pain in left hip, low back and pain radiates into groin area.  Pain is still as painful.  Has been taking otc ibuprofen, no relief, unable to sleep

## 2021-01-28 ENCOUNTER — Ambulatory Visit (HOSPITAL_COMMUNITY)
Admission: EM | Admit: 2021-01-28 | Discharge: 2021-01-28 | Disposition: A | Discharge status: Home / Self Care | Payer: Medicare Other | Attending: Family Medicine | Admitting: Family Medicine

## 2021-01-28 ENCOUNTER — Encounter (HOSPITAL_COMMUNITY): Payer: Self-pay

## 2021-01-28 ENCOUNTER — Other Ambulatory Visit: Payer: Self-pay

## 2021-01-28 DIAGNOSIS — L0291 Cutaneous abscess, unspecified: Secondary | ICD-10-CM | POA: Diagnosis not present

## 2021-01-28 MED ORDER — SULFAMETHOXAZOLE-TRIMETHOPRIM 800-160 MG PO TABS: 1.0000 | ORAL_TABLET | Freq: Two times a day (BID) | ORAL | 0 refills | Status: AC

## 2021-01-28 MED ORDER — HIBICLENS 4 % EX LIQD: Freq: Every day | CUTANEOUS | 0 refills | Status: DC | PRN

## 2021-01-28 NOTE — ED Triage Notes (Signed)
Pt reports abscess like area to R shoulder x 3 days.  Unable to sleep d/t pain.  Has tried heat pad, warm compress, tylenol, ibuprofen etc with temp relief.  No drainage.

## 2021-01-28 NOTE — ED Provider Notes (Signed)
[place]    CSN: [id number] Arrival date & time: 01/28/21  0815      History   Chief Complaint Chief Complaint  Patient presents with   Abscess    R shoulder     HPI Franklin [name] is a 40 y.o. male.   Here today with several day history of redness, pain, swelling on the right posterior shoulder/upper back.  He states that he has been trying warm compresses to the area and over-the-counter pain relievers with no relief.  Denies fever, chills, drainage, injury to the area or insect bite.   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[place])    "raises the possibility of non-epileptic events" per Neuro MD office note 03/2015   Depression    Dizziness    Hx of electroencephalogram 12/2014   normal   Insomnia    Mild cognitive impairment    Psychogenic nonepileptic seizure    Schizophrenia ([place])    Schizophrenic disorder ([place])    Seizure-like activity ([place])    Seizures ([place])     Patient Active Problem List   Diagnosis Date Noted   Migraine without aura and without status migrainosus, not intractable 05/22/2018   Vertigo 05/22/2018   Psychogenic nonepileptic seizure 02/18/2017   Cholelithiasis 01/24/2017   Conversion disorder with seizures or convulsions 03/26/2016   Spells of decreased attentiveness 03/16/2016   Abnormal EKG 11/30/2015   Elevated troponin 11/30/2015   Bipolar affective disorder, most recent episode unspecified type, remission status unspecified 03/11/2015   Bipolar affective disorder ([place]) 01/08/2015   Generalized idiopathic epilepsy and epileptic syndromes, without status epilepticus, not intractable ([place]) 11/27/2014   Seizure disorder ([place]) 10/14/2014    History reviewed. No pertinent surgical history.     Home Medications    Prior to Admission medications   Medication Sig Start Date End  Date Taking? Authorizing Provider  albuterol (PROAIR HFA) 108 (90 Base) MCG/ACT inhaler Inhale 1-2 puffs into the lungs every 6 (six) hours as needed for wheezing or shortness of breath. 01/02/20  Yes Burky, Lanelle Bal B, NP  atorvastatin (LIPITOR) 10 MG tablet Take 10 mg by mouth daily. 02/26/20  Yes [provider]  chlorhexidine (HIBICLENS) 4 % external liquid Apply topically daily as needed. 01/28/21  Yes Volney American, PA-C  lamoTRIgine (LAMICTAL) 100 MG tablet Take 100 mg by mouth daily.   Yes [provider]  lamoTRIgine (LAMICTAL) 25 MG tablet Take 25 mg by mouth 2 (two) times daily. 03/20/20  Yes [provider]  meclizine (ANTIVERT) 25 MG tablet Take 1 tablet (25 mg total) by mouth 3 (three) times daily as needed for dizziness. 08/08/20  Yes Venter, Margaux, PA-C  sulfamethoxazole-trimethoprim (BACTRIM DS) 800-160 MG tablet Take 1 tablet by mouth 2 (two) times daily for 7 days. 01/28/21 02/04/21 Yes Volney American, PA-C  tiZANidine (ZANAFLEX) 4 MG tablet Take 1 tablet (4 mg total) by mouth every 8 (eight) hours as needed for muscle spasms. 01/05/21  Yes Melynda Ripple, MD  famotidine (PEPCID) 20 MG tablet Take 1 tablet (20 mg total) by mouth 2 (two) times daily for 7 days. Patient taking differently: Take 20 mg by mouth 2 (two) times daily as needed for heartburn. 12/29/19 04/28/20  Darr, Edison Nasuti, PA-C  Lumateperone Tosylate (CAPLYTA) 42 MG CAPS Take 42 mg by mouth daily. Patient  not taking: No sig reported    [provider]  diphenhydrAMINE (BENADRYL) 25 MG tablet Take 2 tablets (50 mg total) by mouth at bedtime as needed for up to 7 days. 12/29/19 02/15/20  Darr, Edison Nasuti, PA-C  loratadine (CLARITIN) 10 MG tablet Take 1 tablet (10 mg total) by mouth daily. Patient not taking: Reported on 10/11/2019 09/18/19 12/02/19  Charlott Rakes, MD  Oxcarbazepine (TRILEPTAL) 300 MG tablet Take 1/2 tablet twice a day Patient not taking: Reported on 07/08/2019 01/26/18  07/09/19  Argentina Donovan, PA-C    Family History Family History  Problem Relation Age of Onset   Kidney failure Mother    Heart disease Mother     Social History Social History   Tobacco Use   Smoking status: Never   Smokeless tobacco: Never  Vaping Use   Vaping Use: Never used  Substance Use Topics   Alcohol use: Not Currently    Comment: special occassions   Drug use: Never     Allergies   Coconut oil, Other, Codeine, Depakote [divalproex sodium], Hydroxyzine, Penicillins, Tape, Keflex [cephalexin], Latex, and Levetiracetam   Review of Systems Review of Systems Per HPI  Physical Exam Triage Vital Signs ED Triage Vitals  Enc Vitals Group     BP 01/28/21 0918 116/88     Pulse Rate 01/28/21 0918 (!) 56     Resp 01/28/21 0918 18     Temp 01/28/21 0918 98.5 F (36.9 C)     Temp Source 01/28/21 0918 Oral     SpO2 01/28/21 0918 99 %     Weight --      Height --      Head Circumference --      Peak Flow --      Pain Score 01/28/21 0920 10     Pain Loc --      Pain Edu? --      Excl. in [place]? --    No data found.  Updated Vital Signs BP 116/88 (BP Location: Left Arm)   Pulse (!) 56   Temp 98.5 F (36.9 C) (Oral)   Resp 18   SpO2 99%   Visual Acuity Right Eye Distance:   Left Eye Distance:   Bilateral Distance:    Right Eye Near:   Left Eye Near:    Bilateral Near:     Physical Exam Vitals and nursing note reviewed.  Constitutional:      Appearance: Normal appearance.  HENT:     Head: Atraumatic.  Eyes:     Extraocular Movements: Extraocular movements intact.     Conjunctiva/sclera: Conjunctivae normal.  Cardiovascular:     Rate and Rhythm: Normal rate and regular rhythm.     Pulses: Normal pulses.  Pulmonary:     Effort: Pulmonary effort is normal.     Breath sounds: Normal breath sounds. No wheezing or rales.  Musculoskeletal:        General: Normal range of motion.     Cervical back: Normal range of motion and neck supple.  Skin:     General: Skin is warm and dry.     Findings: Erythema present.     Comments: Large area of erythema, edema with a central pustular region right shoulder/upper back region.  Significantly tender to palpation, no fluctuance or induration.  No active drainage.  Neurological:     General: No focal deficit present.     Mental Status: He is oriented to person, place, and time.  Motor: No weakness.  Psychiatric:        Mood and Affect: Mood normal.        Thought Content: Thought content normal.        Judgment: Judgment normal.     UC Treatments / Results  Labs (all labs ordered are listed, but only abnormal results are displayed) Labs Reviewed - No data to display  EKG   Radiology No results found.  Procedures Procedures (including critical care time)  Medications Ordered in UC Medications - No data to display  Initial Impression / Assessment and Plan / UC Course  I have reviewed the triage vital signs and the nursing notes.  Pertinent labs & imaging results that were available during my care of the patient were reviewed by me and considered in my medical decision making (see chart for details).     Will treat with Bactrim, Hibiclens, Neosporin, keep covered.  Discussed warm compresses off-and-on, over-the-counter pain relievers.  Follow-up if worsening or not resolving  Final Clinical Impressions(s) / UC Diagnoses   Final diagnoses:  Abscess   Discharge Instructions   None    ED Prescriptions     Medication Sig Dispense Auth. Provider   sulfamethoxazole-trimethoprim (BACTRIM DS) 800-160 MG tablet Take 1 tablet by mouth 2 (two) times daily for 7 days. 14 tablet Volney American, [place]   chlorhexidine (HIBICLENS) 4 % external liquid Apply topically daily as needed. 120 mL Volney American, [place]      PDMP not reviewed this encounter.   Merrie Roof [place], [place] 01/28/21 [phone]

## 2021-03-14 ENCOUNTER — Emergency Department (HOSPITAL_COMMUNITY): Payer: Medicare Other

## 2021-03-14 ENCOUNTER — Emergency Department (HOSPITAL_COMMUNITY)
Admission: EM | Admit: 2021-03-14 | Discharge: 2021-03-14 | Disposition: A | Discharge status: Home / Self Care | Payer: Medicare Other | Attending: Emergency Medicine | Admitting: Emergency Medicine

## 2021-03-14 ENCOUNTER — Encounter (HOSPITAL_COMMUNITY): Payer: Self-pay | Admitting: Emergency Medicine

## 2021-03-14 DIAGNOSIS — R569 Unspecified convulsions: Secondary | ICD-10-CM

## 2021-03-14 DIAGNOSIS — G40909 Epilepsy, unspecified, not intractable, without status epilepticus: Secondary | ICD-10-CM | POA: Diagnosis not present

## 2021-03-14 DIAGNOSIS — Z91040 Latex allergy status: Secondary | ICD-10-CM | POA: Insufficient documentation

## 2021-03-14 LAB — BASIC METABOLIC PANEL
Anion gap: 8 (ref 5–15)
BUN: 14 mg/dL (ref 6–20)
CO2: 23 mmol/L (ref 22–32)
Calcium: 9.5 mg/dL (ref 8.9–10.3)
Chloride: 108 mmol/L (ref 98–111)
Creatinine, Ser: 1.01 mg/dL (ref 0.61–1.24)
GFR, Estimated: >60 mL/min (ref >60)
Glucose, Bld: 102 mg/dL — ABNORMAL HIGH (ref 70–99)
Potassium: 3.7 mmol/L (ref 3.5–5.1)
Sodium: 139 mmol/L (ref 135–145)

## 2021-03-14 LAB — TROPONIN I (HIGH SENSITIVITY): Troponin I (High Sensitivity): 5 ng/L (ref <18)

## 2021-03-14 LAB — CBC WITH DIFFERENTIAL/PLATELET
Abs Immature Granulocytes: 0.02 10*3/uL (ref 0.00–0.07)
Basophils Absolute: 0 10*3/uL (ref 0.0–0.1)
Basophils Relative: 0 %
Eosinophils Absolute: 0 10*3/uL (ref 0.0–0.5)
Eosinophils Relative: 0 %
HCT: 42.3 % (ref 39.0–52.0)
Hemoglobin: 14.5 g/dL (ref 13.0–17.0)
Immature Granulocytes: 0 %
Lymphocytes Relative: 12 %
Lymphs Abs: 1.3 10*3/uL (ref 0.7–4.0)
MCH: 30.2 pg (ref 26.0–34.0)
MCHC: 34.3 g/dL (ref 30.0–36.0)
MCV: 88.1 fL (ref 80.0–100.0)
Monocytes Absolute: 0.6 10*3/uL (ref 0.1–1.0)
Monocytes Relative: 6 %
Neutro Abs: 8.1 10*3/uL — ABNORMAL HIGH (ref 1.7–7.7)
Neutrophils Relative %: 82 %
Platelets: 210 10*3/uL (ref 150–400)
RBC: 4.8 MIL/uL (ref 4.22–5.81)
RDW: 12.1 % (ref 11.5–15.5)
WBC: 10.1 10*3/uL (ref 4.0–10.5)
nRBC: 0 % (ref 0.0–0.2)

## 2021-03-14 NOTE — ED Notes (Signed)
E-signature pad unavailable at time of pt discharge. This RN discussed discharge materials with pt and answered all pt questions. Pt stated understanding of discharge material. ? ?

## 2021-03-14 NOTE — ED Triage Notes (Addendum)
Pt bib EMS for seizure at home. Pt has known seizure and psychiatric hx, recent change in psych medications. Per EMS, pt's wife report that pt's seizure episode last approx 1 minute. Found by EMS in post-ictal state, alert and oriented x4 and cooperative. No complaints of pain at this time. Pt states that he has been off his antiseizure medications for "a long time," per a consult with a neurologist.   EMS vitals: HR 80 BP 135/89  CBG 99 98% room air

## 2021-03-14 NOTE — Discharge Instructions (Signed)
Please read and follow all provided instructions.  Your diagnoses today include:  1. Seizure-like activity ([place])     Tests performed today include: Complete blood cell count: No problems Basic metabolic panel: No problems Cardiac enzymes (blood test looking for stress on the heart): Was normal EKG: No changes Chest x-ray: No problems with the lungs Vital signs. See below for your results today.   Medications prescribed:  None  Take any prescribed medications only as directed.  Home care instructions:  Follow any educational materials contained in this packet.  BE VERY CAREFUL not to take multiple medicines containing Tylenol (also called acetaminophen). Doing so can lead to an overdose which can damage your liver and cause liver failure and possibly death.   Follow-up instructions: Please follow-up with your primary care provider in the next 3 days for further evaluation of your symptoms.   Return instructions:  Please return to the Emergency Department if you experience worsening symptoms.  Please return if you have any other emergent concerns.  Additional Information:  Your vital signs today were: BP 125/78   Pulse 61   Temp 97.9 F (36.6 C) (Oral)   Resp 17   Ht 6' (1.829 m)   Wt 63.5 kg   SpO2 98%   BMI 18.99 kg/m  If your blood pressure (BP) was elevated above 135/85 this visit, please have this repeated by your doctor within one month. --------------

## 2021-03-14 NOTE — ED Provider Notes (Signed)
[facility] EMERGENCY DEPARTMENT Provider Note   CSN: [id number] Arrival date & time: 03/14/21  2101     History No chief complaint on file.   Franklin [name] is a 40 y.o. male.  Patient with history of nonepileptic seizures presents the emergency department after seizure-like activity.  Patient states that he got into an argument with a roommate over going to a store while they were out today.  His roommate just wanted to go home and this stressed the patient.  Upon returning home he had 3 seizure-like spells witnessed by the patient's wife.  She describes him laying on the bed with generalized shaking lasting about a minute.  He then woke up and was yelling and screaming, holding his chest as though he was in pain.  He then had 2 other events back to back where he was shaking.  He was unresponsive during this time and then confused upon waking up.  EMS was called and patient was reportedly in a postictal state.  Per the wife, he had a 4th spell after EMS arrival.  Patient did not bite his tongue and was not incontinent.  He has recovered and currently just feels lightheaded.  He states that he felt very dizzy and lightheaded and needed to be picked up and placed on the stretcher when EMS arrived.  No current or recent headaches.  No fevers or confusion.  No neck pain or neck stiffness.  No current chest pain.  Onset of symptoms were acute.  Course is resolved.  Nothing make symptoms better.      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[place])    "raises the possibility of non-epileptic events" per Neuro MD office note 03/2015   Depression    Dizziness    Hx of electroencephalogram 12/2014   normal   Insomnia    Mild cognitive impairment    Psychogenic nonepileptic seizure    Schizophrenia ([place])    Schizophrenic disorder ([place])     Seizure-like activity ([place])    Seizures ([place])     Patient Active Problem List   Diagnosis Date Noted   Migraine without aura and without status migrainosus, not intractable 05/22/2018   Vertigo 05/22/2018   Psychogenic nonepileptic seizure 02/18/2017   Cholelithiasis 01/24/2017   Conversion disorder with seizures or convulsions 03/26/2016   Spells of decreased attentiveness 03/16/2016   Abnormal EKG 11/30/2015   Elevated troponin 11/30/2015   Bipolar affective disorder, most recent episode unspecified type, remission status unspecified 03/11/2015   Bipolar affective disorder ([place]) 01/08/2015   Generalized idiopathic epilepsy and epileptic syndromes, without status epilepticus, not intractable ([place]) 11/27/2014   Seizure disorder ([place]) 10/14/2014    History reviewed. No pertinent surgical history.     Family History  Problem Relation Age of Onset   Kidney failure Mother    Heart disease Mother     Social History   Tobacco Use   Smoking status: Never   Smokeless tobacco: Never  Vaping Use   Vaping Use: Never used  Substance Use Topics   Alcohol use: Not Currently    Comment: special occassions   Drug use: Never    Home Medications Prior to Admission medications   Medication Sig Start Date End Date Taking? Authorizing Provider  albuterol (PROAIR HFA) 108 (90 Base) MCG/ACT inhaler Inhale 1-2  puffs into the lungs every 6 (six) hours as needed for wheezing or shortness of breath. 01/02/20   Zigmund Gottron, NP  atorvastatin (LIPITOR) 10 MG tablet Take 10 mg by mouth daily. 02/26/20   [provider]  chlorhexidine (HIBICLENS) 4 % external liquid Apply topically daily as needed. 01/28/21   Volney American, PA-C  famotidine (PEPCID) 20 MG tablet Take 1 tablet (20 mg total) by mouth 2 (two) times daily for 7 days. Patient taking differently: Take 20 mg by mouth 2 (two) times daily as needed for heartburn. 12/29/19 04/28/20  Darr, Edison Nasuti, PA-C  lamoTRIgine  (LAMICTAL) 100 MG tablet Take 100 mg by mouth daily.    [provider]  lamoTRIgine (LAMICTAL) 25 MG tablet Take 25 mg by mouth 2 (two) times daily. 03/20/20   [provider]  Lumateperone Tosylate (CAPLYTA) 42 MG CAPS Take 42 mg by mouth daily. Patient not taking: No sig reported    [provider]  meclizine (ANTIVERT) 25 MG tablet Take 1 tablet (25 mg total) by mouth 3 (three) times daily as needed for dizziness. 08/08/20   Alroy Bailiff, Margaux, PA-C  tiZANidine (ZANAFLEX) 4 MG tablet Take 1 tablet (4 mg total) by mouth every 8 (eight) hours as needed for muscle spasms. 01/05/21   Melynda Ripple, MD  diphenhydrAMINE (BENADRYL) 25 MG tablet Take 2 tablets (50 mg total) by mouth at bedtime as needed for up to 7 days. 12/29/19 02/15/20  Darr, Edison Nasuti, PA-C  loratadine (CLARITIN) 10 MG tablet Take 1 tablet (10 mg total) by mouth daily. Patient not taking: Reported on 10/11/2019 09/18/19 12/02/19  Charlott Rakes, MD  Oxcarbazepine (TRILEPTAL) 300 MG tablet Take 1/2 tablet twice a day Patient not taking: Reported on 07/08/2019 01/26/18 07/09/19  Argentina Donovan, PA-C    Allergies    Coconut oil, Other, Codeine, Depakote [divalproex sodium], Hydroxyzine, Penicillins, Tape, Keflex [cephalexin], Latex, and Levetiracetam  Review of Systems   Review of Systems  Constitutional:  Negative for fever.  HENT:  Negative for rhinorrhea and sore throat.   Eyes:  Negative for redness.  Respiratory:  Negative for cough and shortness of breath.   Cardiovascular:  Positive for chest pain.  Gastrointestinal:  Negative for abdominal pain, diarrhea, nausea and vomiting.  Genitourinary:  Negative for dysuria and hematuria.  Musculoskeletal:  Negative for myalgias.  Skin:  Negative for rash.  Neurological:  Positive for seizures. Negative for headaches.   Physical Exam Updated Vital Signs BP 132/83 (BP Location: Right Arm)   Pulse 64   Temp 97.9 F (36.6 C) (Oral)   Resp 18   Ht 6' (1.829 m)    Wt 63.5 kg   SpO2 98%   BMI 18.99 kg/m   Physical Exam Vitals and nursing note reviewed.  Constitutional:      Appearance: He is well-developed.  HENT:     Head: Normocephalic and atraumatic.     Right Ear: Tympanic membrane, ear canal and external ear normal.     Left Ear: Tympanic membrane, ear canal and external ear normal.     Nose: Nose normal.     Mouth/Throat:     Pharynx: Uvula midline.     Comments: No intraoral lesions. Eyes:     General: Lids are normal.     Conjunctiva/sclera: Conjunctivae normal.     Pupils: Pupils are equal, round, and reactive to light.  Cardiovascular:     Rate and Rhythm: Normal rate and regular rhythm.  Pulmonary:  Effort: Pulmonary effort is normal.     Breath sounds: Normal breath sounds.  Abdominal:     Palpations: Abdomen is soft.     Tenderness: There is no abdominal tenderness.  Musculoskeletal:        General: Normal range of motion.     Cervical back: Normal range of motion and neck supple. No tenderness or bony tenderness.  Skin:    General: Skin is warm and dry.  Neurological:     Mental Status: He is alert and oriented to person, place, and time.     GCS: GCS eye subscore is 4. GCS verbal subscore is 5. GCS motor subscore is 6.     Cranial Nerves: No cranial nerve deficit.     Sensory: No sensory deficit.     Motor: No abnormal muscle tone.     Coordination: Coordination normal.     Gait: Gait normal.     Deep Tendon Reflexes: Reflexes are normal and symmetric.    ED Results / Procedures / Treatments   Labs (all labs ordered are listed, but only abnormal results are displayed) Labs Reviewed  CBC WITH DIFFERENTIAL/PLATELET  BASIC METABOLIC PANEL  TROPONIN I (HIGH SENSITIVITY)    EKG None  Radiology No results found.  Procedures Procedures   Medications Ordered in ED Medications - No data to display  ED Course  I have reviewed the triage vital signs and the nursing notes.  Pertinent labs & imaging  results that were available during my care of the patient were reviewed by me and considered in my medical decision making (see chart for details).  Patient seen and examined.  Patient appears back to his baseline.  I suspect that these are nonepileptic seizures.  Patient's wife states to me, while obtaining additional history over the telephone, that spells today are consistent with ones that he has had in the past.  Reviewed previous neurology note clearly demonstrating normal EEGs and diagnosis of PNES.    Vital signs reviewed and are as follows: BP 132/83 (BP Location: Right Arm)   Pulse 64   Temp 97.9 F (36.6 C) (Oral)   Resp 18   Ht 6' (1.829 m)   Wt 63.5 kg   SpO2 98%   BMI 18.99 kg/m   Work-up reassuring.  I went and reexamined patient and he was stable.  He is eating and drinking.  He is comfortable discharged home.  I also discussed the findings with patient's wife by telephone.  She is in agreement.  Encouraged PCP follow-up as outpatient.   MDM Rules/Calculators/A&P                           Patient with history of nonepileptic seizures with similar spell tonight seem to be exacerbated or brought on by argument with a roommate.  There was concern for some chest pain during this event.  Work-up was reassuring.  Patient back to baseline.  Symptoms similar to previous spells.  Plan for discharged home with PCP follow-up.  No concern for alcohol or benzo withdrawal.  No neurologic deficits.   Final Clinical Impression(s) / ED Diagnoses Final diagnoses:  Seizure-like activity [facility])    Rx / [place] Orders ED Discharge Orders     None        Carlisle Cater, PA-C 03/15/21 0032    Charlesetta Shanks, MD 03/16/21 1555

## 2021-03-14 NOTE — ED Notes (Signed)
Patient transported to X-ray 

## 2021-04-14 ENCOUNTER — Emergency Department (HOSPITAL_BASED_OUTPATIENT_CLINIC_OR_DEPARTMENT_OTHER)
Admission: EM | Admit: 2021-04-14 | Discharge: 2021-04-14 | Disposition: A | Discharge status: Home / Self Care | Payer: Medicare Other | Attending: Emergency Medicine | Admitting: Emergency Medicine

## 2021-04-14 ENCOUNTER — Encounter (HOSPITAL_BASED_OUTPATIENT_CLINIC_OR_DEPARTMENT_OTHER): Payer: Self-pay | Admitting: Emergency Medicine

## 2021-04-14 ENCOUNTER — Other Ambulatory Visit: Payer: Self-pay

## 2021-04-14 DIAGNOSIS — H04301 Unspecified dacryocystitis of right lacrimal passage: Secondary | ICD-10-CM

## 2021-04-14 DIAGNOSIS — H5711 Ocular pain, right eye: Secondary | ICD-10-CM | POA: Diagnosis not present

## 2021-04-14 DIAGNOSIS — Z91040 Latex allergy status: Secondary | ICD-10-CM | POA: Insufficient documentation

## 2021-04-14 MED ORDER — TETRACAINE HCL 0.5 % OP SOLN
2.0000 [drp] | Freq: Once | OPHTHALMIC | Status: AC
  Administered 2021-04-14: 2 [drp] via OPHTHALMIC
  Filled 2021-04-14: qty 4

## 2021-04-14 MED ORDER — FLUORESCEIN SODIUM 1 MG OP STRP
1.0000 | ORAL_STRIP | Freq: Once | OPHTHALMIC | Status: AC
  Administered 2021-04-14: 1 via OPHTHALMIC
  Filled 2021-04-14: qty 1

## 2021-04-14 MED ORDER — POLYMYXIN B-TRIMETHOPRIM 10000-0.1 UNIT/ML-% OP SOLN: 1.0000 [drp] | OPHTHALMIC | 0 refills | Status: DC

## 2021-04-14 NOTE — ED Provider Notes (Signed)
[facility] Provider Note  CSN: [id number] Arrival date & time: 04/14/21 9211    History Chief Complaint  Patient presents with   Eye Pain    Franklin [name] is a 40 y.o. male brought to the ED via [facility] for evaluation of R eye pain, has a FB sensation. No known injury or FB, states it started in his sleep and he could not get back to sleep. He is able to see out of the eye. Has been draining clear tears.    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 No pertinent surgical history.  Family History  Problem Relation Age of Onset   Kidney failure Mother    Heart disease Mother     Social History   Tobacco Use   Smoking status: Never   Smokeless tobacco: Never  Vaping Use   Vaping Use: Never used  Substance Use Topics   Alcohol use: Not Currently    Comment: special occassions   Drug use: Never     Home Medications Prior to Admission medications   Medication Sig Start Date End Date Taking? Authorizing Provider  albuterol (PROAIR HFA) 108 (90 Base) MCG/ACT inhaler Inhale 1-2 puffs into the lungs every 6 (six) hours as needed for wheezing or shortness of breath. 01/02/20   Zigmund Gottron, NP  atorvastatin (LIPITOR) 10 MG tablet Take 10 mg by mouth daily. 02/26/20   [provider]  chlorhexidine (HIBICLENS) 4 % external liquid Apply topically daily as needed. 01/28/21   Volney American, PA-C  famotidine (PEPCID) 20 MG  tablet Take 1 tablet (20 mg total) by mouth 2 (two) times daily for 7 days. Patient taking differently: Take 20 mg by mouth 2 (two) times daily as needed for heartburn. 12/29/19 04/28/20  Darr, Edison Nasuti, PA-C  lamoTRIgine (LAMICTAL) 100 MG tablet Take 100 mg by mouth daily.    [provider]  lamoTRIgine (LAMICTAL) 25 MG tablet Take 25 mg by mouth 2 (two) times daily. 03/20/20   [provider]  Lumateperone Tosylate (CAPLYTA) 42 MG CAPS Take 42 mg by mouth daily. Patient not taking: No sig reported    [provider]  meclizine (ANTIVERT) 25 MG tablet Take 1 tablet (25 mg total) by mouth 3 (three) times daily as needed for dizziness. 08/08/20   Alroy Bailiff, Margaux, PA-C  tiZANidine (ZANAFLEX) 4 MG tablet Take 1 tablet (4 mg total) by mouth every 8 (eight) hours as needed for muscle spasms. 01/05/21   Melynda Ripple, MD  diphenhydrAMINE (BENADRYL) 25 MG tablet Take 2 tablets (50 mg total) by mouth at bedtime as needed for up to 7 days. 12/29/19 02/15/20  Darr, Edison Nasuti, PA-C  loratadine (CLARITIN) 10 MG tablet Take 1 tablet (10 mg total) by mouth daily. Patient not taking: Reported on 10/11/2019 09/18/19 12/02/19  Charlott Rakes, MD  Oxcarbazepine (TRILEPTAL) 300 MG tablet Take 1/2 tablet  twice a day Patient not taking: Reported on 07/08/2019 01/26/18 07/09/19  Argentina Donovan, PA-C     Allergies    Coconut oil, Other, Codeine, Depakote [divalproex sodium], Hydroxyzine, Penicillins, Tape, Keflex [cephalexin], Latex, and Levetiracetam   Review of Systems   Review of Systems A comprehensive review of systems was completed and negative except as noted in HPI.    Physical Exam BP 127/85 (BP Location: Right Arm)   Pulse (!) 50   Temp 98.3 F (36.8 C) (Oral)   Resp 18   Ht 6' (1.829 m)   Wt 70.3 kg   SpO2 100%   BMI 21.02 kg/m   Physical Exam Vitals and nursing note reviewed.  HENT:     Head: Normocephalic.     Nose: Nose normal.  Eyes:     Extraocular Movements:  Extraocular movements intact.     Pupils: Pupils are equal, round, and reactive to light.     Comments: Mild conjunctival injection to R eye, anterior chamber is clear, pupils are normal.   Pulmonary:     Effort: Pulmonary effort is normal.  Musculoskeletal:        General: Normal range of motion.     Cervical back: Neck supple.  Skin:    Findings: No rash (on exposed skin).  Neurological:     Mental Status: He is alert and oriented to person, place, and time.  Psychiatric:        Mood and Affect: Mood normal.     ED Results / Procedures / Treatments   Labs (all labs ordered are listed, but only abnormal results are displayed) Labs Reviewed - No data to display  EKG None   Radiology No results found.  Procedures Procedures  Medications Ordered in the ED Medications  tetracaine (PONTOCAINE) 0.5 % ophthalmic solution 2 drop (has no administration in time range)  fluorescein ophthalmic strip 1 strip (has no administration in time range)     MDM Rules/Calculators/A&P MDM   ED Course  I have reviewed the triage vital signs and the nursing notes.  Pertinent labs & imaging results that were available during my care of the patient were reviewed by me and considered in my medical decision making (see chart for details).  Clinical Course as of 04/14/21 0747  Tue Apr 14, 2021  0740 Visual acuity as per nursing, states he is supposed to be wearing glasses and is already scheduled to see eye doctor tomorrow. He has not corneal abrasion with fluorescein exam and IOP is 16 on the affected eye. Given his conjunctival injection and mild swelling of the lacrimal duct, will place on eye drops.  [CS]    Clinical Course User Index [CS] Truddie Hidden, MD    Final Clinical Impression(s) / ED Diagnoses Final diagnoses:  None    Rx / DC Orders ED Discharge Orders     None        Truddie Hidden, MD 04/14/21 [phone]

## 2021-04-14 NOTE — ED Triage Notes (Signed)
R eye pain since last night.

## 2021-04-14 NOTE — ED Notes (Signed)
Patient verbalizes understanding of discharge instructions. Opportunity for questioning and answers were provided. Patient discharged from ED.  °

## 2021-04-14 NOTE — ED Triage Notes (Signed)
Pt arrived via [place], ambulatory. Per report, c/o right eye pain and burning since last night. Denies trauma.

## 2021-05-03 ENCOUNTER — Emergency Department (HOSPITAL_COMMUNITY)
Admission: EM | Admit: 2021-05-03 | Discharge: 2021-05-03 | Disposition: A | Discharge status: Home / Self Care | Payer: Medicare Other | Attending: Emergency Medicine | Admitting: Emergency Medicine

## 2021-05-03 ENCOUNTER — Other Ambulatory Visit: Payer: Self-pay

## 2021-05-03 DIAGNOSIS — R569 Unspecified convulsions: Secondary | ICD-10-CM | POA: Insufficient documentation

## 2021-05-03 DIAGNOSIS — Z91040 Latex allergy status: Secondary | ICD-10-CM | POA: Insufficient documentation

## 2021-05-03 DIAGNOSIS — Z79899 Other long term (current) drug therapy: Secondary | ICD-10-CM | POA: Insufficient documentation

## 2021-05-03 DIAGNOSIS — F419 Anxiety disorder, unspecified: Secondary | ICD-10-CM | POA: Insufficient documentation

## 2021-05-03 LAB — CBC WITH DIFFERENTIAL/PLATELET
Abs Immature Granulocytes: 0.02 10*3/uL (ref 0.00–0.07)
Basophils Absolute: 0.1 10*3/uL (ref 0.0–0.1)
Basophils Relative: 1 %
Eosinophils Absolute: 0 10*3/uL (ref 0.0–0.5)
Eosinophils Relative: 1 %
HCT: 43.3 % (ref 39.0–52.0)
Hemoglobin: 14.9 g/dL (ref 13.0–17.0)
Immature Granulocytes: 0 %
Lymphocytes Relative: 22 %
Lymphs Abs: 1.6 10*3/uL (ref 0.7–4.0)
MCH: 30.7 pg (ref 26.0–34.0)
MCHC: 34.4 g/dL (ref 30.0–36.0)
MCV: 89.3 fL (ref 80.0–100.0)
Monocytes Absolute: 0.5 10*3/uL (ref 0.1–1.0)
Monocytes Relative: 7 %
Neutro Abs: 5 10*3/uL (ref 1.7–7.7)
Neutrophils Relative %: 69 %
Platelets: 201 10*3/uL (ref 150–400)
RBC: 4.85 MIL/uL (ref 4.22–5.81)
RDW: 12.2 % (ref 11.5–15.5)
WBC: 7.3 10*3/uL (ref 4.0–10.5)
nRBC: 0 % (ref 0.0–0.2)

## 2021-05-03 LAB — URINALYSIS, ROUTINE W REFLEX MICROSCOPIC
Bilirubin Urine: NEGATIVE
Glucose, UA: NEGATIVE mg/dL
Hgb urine dipstick: NEGATIVE
Ketones, ur: NEGATIVE mg/dL
Leukocytes,Ua: NEGATIVE
Nitrite: NEGATIVE
Protein, ur: NEGATIVE mg/dL
Specific Gravity, Urine: 1.013 (ref 1.005–1.030)
pH: 8 (ref 5.0–8.0)

## 2021-05-03 LAB — COMPREHENSIVE METABOLIC PANEL
ALT: 20 U/L (ref 0–44)
AST: 24 U/L (ref 15–41)
Albumin: 4.4 g/dL (ref 3.5–5.0)
Alkaline Phosphatase: 53 U/L (ref 38–126)
Anion gap: 7 (ref 5–15)
BUN: 16 mg/dL (ref 6–20)
CO2: 25 mmol/L (ref 22–32)
Calcium: 9.3 mg/dL (ref 8.9–10.3)
Chloride: 104 mmol/L (ref 98–111)
Creatinine, Ser: 0.74 mg/dL (ref 0.61–1.24)
GFR, Estimated: >60 mL/min (ref >60)
Glucose, Bld: 93 mg/dL (ref 70–99)
Potassium: 3.6 mmol/L (ref 3.5–5.1)
Sodium: 136 mmol/L (ref 135–145)
Total Bilirubin: 1 mg/dL (ref 0.3–1.2)
Total Protein: 7.7 g/dL (ref 6.5–8.1)

## 2021-05-03 LAB — RAPID URINE DRUG SCREEN, HOSP PERFORMED
Amphetamines: NOT DETECTED
Barbiturates: NOT DETECTED
Benzodiazepines: NOT DETECTED
Cocaine: NOT DETECTED
Opiates: NOT DETECTED
Tetrahydrocannabinol: NOT DETECTED

## 2021-05-03 MED ORDER — LORAZEPAM 1 MG PO TABS
1.0000 mg | ORAL_TABLET | Freq: Once | ORAL | Status: AC
  Administered 2021-05-03: 20:00:00 1 mg via ORAL
  Filled 2021-05-03: qty 1

## 2021-05-03 MED ORDER — LORAZEPAM 1 MG PO TABS: 1.0000 mg | ORAL_TABLET | Freq: Two times a day (BID) | ORAL | 0 refills | Status: DC | PRN
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
## 2021-05-07 ENCOUNTER — Encounter (HOSPITAL_COMMUNITY): Payer: Self-pay

## 2021-05-07 ENCOUNTER — Ambulatory Visit (HOSPITAL_COMMUNITY)
Admission: EM | Admit: 2021-05-07 | Discharge: 2021-05-07 | Disposition: A | Discharge status: Home / Self Care | Payer: Medicare Other | Attending: Family Medicine | Admitting: Family Medicine

## 2021-05-07 ENCOUNTER — Other Ambulatory Visit: Payer: Self-pay

## 2021-05-07 DIAGNOSIS — L03211 Cellulitis of face: Secondary | ICD-10-CM | POA: Diagnosis not present

## 2021-05-07 LAB — LAMOTRIGINE LEVEL: Lamotrigine Lvl: <1 ug/mL — ABNORMAL LOW (ref 2.0–20.0)

## 2021-05-07 MED ORDER — IBUPROFEN 600 MG PO TABS: 600.0000 mg | ORAL_TABLET | Freq: Three times a day (TID) | ORAL | 0 refills | Status: DC | PRN

## 2021-05-07 MED ORDER — SULFAMETHOXAZOLE-TRIMETHOPRIM 800-160 MG PO TABS: 1.0000 | ORAL_TABLET | Freq: Two times a day (BID) | ORAL | 0 refills | Status: AC

## 2021-05-07 NOTE — Discharge Instructions (Addendum)
Take the Bactrim DS (the bottle will say sulfamethoxazole-trimethoprim) twice daily; it's the antibiotic. Best tolerated taken with some food. If you can try to get in a dose this afternoon and one at bedtime tonight, to get 2 doses in.  Take the ibuprofen 600 mg every 8 hours as needed for pain.  Put a warm compress on the area every 3-4 hours if possible.  If the spot is enlarging, or if it is not improving in the next 48 hours, please return here or to the ER for recheck.

## 2021-05-07 NOTE — ED Triage Notes (Signed)
Pt c/o painful red bump between his nose and upper lip x2q days.

## 2021-05-07 NOTE — ED Provider Notes (Signed)
Franklin [name]    CSN: [id number] Arrival date & time: 05/07/21  1304      History   Chief Complaint Chief Complaint  Patient presents with   bump above lip    HPI LUISMIGUEL LAMERE is a 40 y.o. male.   HPI Here for 2 day history of swollen area with worsening pain at the junction of his nasal philtrum and upper left lip. No f/c. Has done a little warm compress.   No n/v.  PMH: epilepsy, on lamictal. On meds for bipolar disorder.  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[place])    "raises the possibility of non-epileptic events" per Neuro MD office note 03/2015   Depression    Dizziness    Hx of electroencephalogram 12/2014   normal   Insomnia    Mild cognitive impairment    Psychogenic nonepileptic seizure    Schizophrenia ([place])    Schizophrenic disorder ([place])    Seizure-like activity ([place])    Seizures ([place])     Patient Active Problem List   Diagnosis Date Noted   Migraine without aura and without status migrainosus, not intractable 05/22/2018   Vertigo 05/22/2018   Psychogenic nonepileptic seizure 02/18/2017   Cholelithiasis 01/24/2017   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 No pertinent surgical history.     Home Medications    Prior to Admission medications   Medication Sig Start Date End  Date Taking? Authorizing Provider  ibuprofen (ADVIL) 600 MG tablet Take 1 tablet (600 mg total) by mouth every 8 (eight) hours as needed (pain). 05/07/21  Yes Barrett Henle, MD  sulfamethoxazole-trimethoprim (BACTRIM DS) 800-160 MG tablet Take 1 tablet by mouth 2 (two) times daily for 7 days. 05/07/21 05/14/21 Yes Barrett Henle, MD  albuterol (PROAIR HFA) 108 (90 Base) MCG/ACT inhaler Inhale 1-2 puffs into the lungs every 6 (six) hours as needed for wheezing or shortness of breath. 01/02/20   Zigmund Gottron, NP  atorvastatin (LIPITOR) 10 MG tablet Take 10 mg by mouth daily. 02/26/20   [provider]  chlorhexidine (HIBICLENS) 4 % external liquid Apply topically daily as needed. 01/28/21   Volney American, PA-C  famotidine (PEPCID) 20 MG tablet Take 1 tablet (20 mg total) by mouth 2 (two) times daily for 7 days. Patient taking differently: Take 20 mg by mouth 2 (two) times daily as needed for heartburn. 12/29/19 04/28/20  Darr, Edison Nasuti, PA-C  lamoTRIgine (LAMICTAL) 100 MG tablet Take 100 mg by mouth daily.    [provider]  lamoTRIgine (LAMICTAL) 25 MG tablet Take 25 mg by mouth 2 (two) times daily. 03/20/20   [provider]  LORazepam (ATIVAN) 1 MG tablet Take 1 tablet (1 mg total) by mouth every 12 (twelve) hours as needed for anxiety. 05/03/21   Varney Biles, MD  Lumateperone Tosylate (CAPLYTA) 42 MG CAPS Take  42 mg by mouth daily. Patient not taking: No sig reported    [provider]  meclizine (ANTIVERT) 25 MG tablet Take 1 tablet (25 mg total) by mouth 3 (three) times daily as needed for dizziness. 08/08/20   Alroy Bailiff, Margaux, PA-C  tiZANidine (ZANAFLEX) 4 MG tablet Take 1 tablet (4 mg total) by mouth every 8 (eight) hours as needed for muscle spasms. 01/05/21   Melynda Ripple, MD  diphenhydrAMINE (BENADRYL) 25 MG tablet Take 2 tablets (50 mg total) by mouth at bedtime as needed for up to 7 days. 12/29/19 02/15/20  Darr, Edison Nasuti, PA-C  loratadine  (CLARITIN) 10 MG tablet Take 1 tablet (10 mg total) by mouth daily. Patient not taking: Reported on 10/11/2019 09/18/19 12/02/19  Charlott Rakes, MD  Oxcarbazepine (TRILEPTAL) 300 MG tablet Take 1/2 tablet twice a day Patient not taking: Reported on 07/08/2019 01/26/18 07/09/19  Argentina Donovan, PA-C    Family History Family History  Problem Relation Age of Onset   Kidney failure Mother    Heart disease Mother     Social History Social History   Tobacco Use   Smoking status: Never   Smokeless tobacco: Never  Vaping Use   Vaping Use: Never used  Substance Use Topics   Alcohol use: Not Currently    Comment: special occassions   Drug use: Never     Allergies   Coconut oil, Other, Codeine, Depakote [divalproex sodium], Hydroxyzine, Penicillins, Tape, Keflex [cephalexin], Latex, and Levetiracetam   Review of Systems Review of Systems   Physical Exam Triage Vital Signs ED Triage Vitals [05/07/21 1504]  Enc Vitals Group     BP (!) 146/88     Pulse Rate (!) 58     Resp 18     Temp 99.1 F (37.3 C)     Temp Source Oral     SpO2 99 %     Weight      Height      Head Circumference      Peak Flow      Pain Score 7     Pain Loc      Pain Edu?      Excl. in [place]?    No data found.  Updated Vital Signs BP (!) 146/88 (BP Location: Left Arm)   Pulse (!) 58   Temp 99.1 F (37.3 C) (Oral)   Resp 18   SpO2 99%   Visual Acuity Right Eye Distance:   Left Eye Distance:   Bilateral Distance:    Right Eye Near:   Left Eye Near:    Bilateral Near:     Physical Exam Vitals reviewed.  Constitutional:      General: He is not in acute distress.    Appearance: He is not ill-appearing, toxic-appearing or diaphoretic.  HENT:     Nose:     Comments: On the left upper lip, at the left aspect of the philtrum where it joins the lip, there is a raised erythematous lesion, about 5 mm in diameter with a dot of excoriation/ulceration in the center. Not fluctuant.    Mouth/Throat:      Mouth: Mucous membranes are moist.  Eyes:     Extraocular Movements: Extraocular movements intact.     Pupils: Pupils are equal, round, and reactive to light.  Cardiovascular:     Rate and Rhythm: Normal rate and regular rhythm.     Heart sounds: No murmur heard. Pulmonary:     Effort: Pulmonary effort is normal.  Breath sounds: Normal breath sounds.  Musculoskeletal:     Cervical back: Neck supple.  Lymphadenopathy:     Cervical: No cervical adenopathy.  Skin:    Coloration: Skin is not jaundiced or pale.  Neurological:     Mental Status: He is alert and oriented to person, place, and time.  Psychiatric:        Behavior: Behavior normal.     UC Treatments / Results  Labs (all labs ordered are listed, but only abnormal results are displayed) Labs Reviewed - No data to display  EKG   Radiology No results found.  Procedures Procedures (including critical care time)  Medications Ordered in UC Medications - No data to display  Initial Impression / Assessment and Plan / UC Course  I have reviewed the triage vital signs and the nursing notes.  Pertinent labs & imaging results that were available during my care of the patient were reviewed by me and considered in my medical decision making (see chart for details).     For now, will treat with more frequent warm compresses and bactrim DS.  Return for worsening.  Ibuprofen for the pain.  Warm compresses every 3-4 hours in the next day or so. Final Clinical Impressions(s) / UC Diagnoses   Final diagnoses:  Cellulitis of face     Discharge Instructions      Take the Bactrim DS (the bottle will say sulfamethoxazole-trimethoprim) twice daily; it's the antibiotic. Best tolerated taken with some food. If you can try to get in a dose this afternoon and one at bedtime tonight, to get 2 doses in.  Take the ibuprofen 600 mg every 8 hours as needed for pain.  Put a warm compress on the area every 3-4 hours if  possible.  If the spot is enlarging, or if it is not improving in the next 48 hours, please return here or to the ER for recheck.      ED Prescriptions     Medication Sig Dispense Auth. Provider   sulfamethoxazole-trimethoprim (BACTRIM DS) 800-160 MG tablet Take 1 tablet by mouth 2 (two) times daily for 7 days. 14 tablet Jaylie Neaves, Gwenlyn Perking, MD   ibuprofen (ADVIL) 600 MG tablet Take 1 tablet (600 mg total) by mouth every 8 (eight) hours as needed (pain). 30 tablet Yemaya Barnier, Gwenlyn Perking, MD      PDMP not reviewed this encounter.   Barrett Henle, MD 05/07/21 1525

## 2021-08-11 ENCOUNTER — Encounter (HOSPITAL_BASED_OUTPATIENT_CLINIC_OR_DEPARTMENT_OTHER): Payer: Self-pay

## 2021-08-11 ENCOUNTER — Emergency Department (HOSPITAL_BASED_OUTPATIENT_CLINIC_OR_DEPARTMENT_OTHER)
Admission: EM | Admit: 2021-08-11 | Discharge: 2021-08-11 | Disposition: A | Discharge status: Home / Self Care | Payer: Medicare Other | Attending: Emergency Medicine | Admitting: Emergency Medicine

## 2021-08-11 ENCOUNTER — Other Ambulatory Visit: Payer: Self-pay

## 2021-08-11 DIAGNOSIS — L0291 Cutaneous abscess, unspecified: Secondary | ICD-10-CM

## 2021-08-11 DIAGNOSIS — Z91040 Latex allergy status: Secondary | ICD-10-CM | POA: Insufficient documentation

## 2021-08-11 DIAGNOSIS — L02411 Cutaneous abscess of right axilla: Secondary | ICD-10-CM | POA: Insufficient documentation

## 2021-08-11 MED ORDER — LIDOCAINE HCL (PF) 1 % IJ SOLN
30.0000 mL | Freq: Once | INTRAMUSCULAR | Status: DC
  Filled 2021-08-11: qty 30

## 2021-08-11 MED ORDER — SULFAMETHOXAZOLE-TRIMETHOPRIM 800-160 MG PO TABS
1.0000 | ORAL_TABLET | Freq: Two times a day (BID) | ORAL | 0 refills | Status: AC
Start: 2021-08-11 — End: 2021-08-18

## 2021-08-11 MED ORDER — LIDOCAINE HCL (PF) 1 % IJ SOLN: 20.0000 mL | Freq: Once | INTRAMUSCULAR | Status: DC

## 2021-08-11 MED ORDER — OXYCODONE-ACETAMINOPHEN 5-325 MG PO TABS
1.0000 | ORAL_TABLET | Freq: Once | ORAL | Status: AC
  Administered 2021-08-11: 1 via ORAL
  Filled 2021-08-11: qty 1

## 2021-08-11 MED ORDER — LIDOCAINE HCL 1 % IJ SOLN
INTRAMUSCULAR | Status: AC
  Administered 2021-08-11: 20 mL
  Filled 2021-08-11: qty 20

## 2021-08-11 NOTE — ED Provider Notes (Signed)
?[facility] EMERGENCY DEPT ?Provider Note ? ? ?CSN: [id number] ?Arrival date & time: 08/11/21  1110 ? ?  ? ?History ? ?Chief Complaint  ?Patient presents with  ? Abscess  ? ? ?Franklin [name] is a 41 y.o. male.  With no pertinent past medical history presents emergency department with axillary abscess. ? ?Patient states that abscess began 2 days ago under his right arm.  He states that he went to urgent care yesterday and was prescribed antibiotics however states that the pain was "too much" so he came here.  He denies previous abscesses.  Denies drainage from the area or fevers ? ? ?Abscess ?Associated symptoms: no fever   ? ?  ? ?Home Medications ?Prior to Admission medications   ?Medication Sig Start Date End Date Taking? Authorizing Provider  ?albuterol (PROAIR HFA) 108 (90 Base) MCG/ACT inhaler Inhale 1-2 puffs into the lungs every 6 (six) hours as needed for wheezing or shortness of breath. 01/02/20   Zigmund Gottron, NP  ?atorvastatin (LIPITOR) 10 MG tablet Take 10 mg by mouth daily. 02/26/20   [provider]  ?chlorhexidine (HIBICLENS) 4 % external liquid Apply topically daily as needed. 01/28/21   Volney American, PA-C  ?famotidine (PEPCID) 20 MG tablet Take 1 tablet (20 mg total) by mouth 2 (two) times daily for 7 days. ?Patient taking differently: Take 20 mg by mouth 2 (two) times daily as needed for heartburn. 12/29/19 04/28/20  Darr, Edison Nasuti, PA-C  ?ibuprofen (ADVIL) 600 MG tablet Take 1 tablet (600 mg total) by mouth every 8 (eight) hours as needed (pain). 05/07/21   Barrett Henle, MD  ?lamoTRIgine (LAMICTAL) 100 MG tablet Take 100 mg by mouth daily.    [provider]  ?lamoTRIgine (LAMICTAL) 25 MG tablet Take 25 mg by mouth 2 (two) times daily. 03/20/20   [provider]  ?LORazepam (ATIVAN) 1 MG tablet Take 1 tablet (1 mg total) by mouth every 12 (twelve) hours as needed for anxiety. 05/03/21   Varney Biles, MD  ?Lumateperone Tosylate (CAPLYTA) 42 MG  CAPS Take 42 mg by mouth daily. ?Patient not taking: No sig reported    [provider]  ?meclizine (ANTIVERT) 25 MG tablet Take 1 tablet (25 mg total) by mouth 3 (three) times daily as needed for dizziness. 08/08/20   Eustaquio Maize, PA-C  ?tiZANidine (ZANAFLEX) 4 MG tablet Take 1 tablet (4 mg total) by mouth every 8 (eight) hours as needed for muscle spasms. 01/05/21   Melynda Ripple, MD  ?diphenhydrAMINE (BENADRYL) 25 MG tablet Take 2 tablets (50 mg total) by mouth at bedtime as needed for up to 7 days. 12/29/19 02/15/20  Darr, Edison Nasuti, PA-C  ?loratadine (CLARITIN) 10 MG tablet Take 1 tablet (10 mg total) by mouth daily. ?Patient not taking: Reported on 10/11/2019 09/18/19 12/02/19  Charlott Rakes, MD  ?Oxcarbazepine (TRILEPTAL) 300 MG tablet Take 1/2 tablet twice a day ?Patient not taking: Reported on 07/08/2019 01/26/18 07/09/19  Argentina Donovan, PA-C  ?   ? ?Allergies    ?Coconut oil, Other, Codeine, Depakote [divalproex sodium], Hydroxyzine, Penicillins, Tape, Keflex [cephalexin], Latex, and Levetiracetam   ? ?Review of Systems   ?Review of Systems  ?Constitutional:  Negative for fever.  ?Skin:  Positive for wound.  ?All other systems reviewed and are negative. ? ?Physical Exam ?Updated Vital Signs ?BP 140/89 (BP Location: Left Arm)   Pulse (!) 56   Temp 98.1 ?F (36.7 ?C)   Resp 16   Ht 6' (1.829 m)  Wt 70.3 kg   SpO2 100%   BMI 21.02 kg/m?  ?Physical Exam ?Vitals and nursing note reviewed.  ?Constitutional:   ?   Appearance: Normal appearance.  ?HENT:  ?   Head: Normocephalic and atraumatic.  ?Eyes:  ?   General: No scleral icterus. ?   Extraocular Movements: Extraocular movements intact.  ?Cardiovascular:  ?   Pulses: Normal pulses.  ?Pulmonary:  ?   Effort: Pulmonary effort is normal. No respiratory distress.  ?Musculoskeletal:     ?   General: Normal range of motion.  ?Skin: ?   General: Skin is warm and dry.  ?   Capillary Refill: Capillary refill takes less than 2 seconds.  ?   Findings: Abscess  present. No rash.  ?   Comments: Approximately 1 to 2 cm right axillary abscess, no surrounding cellulitis  ?Neurological:  ?   General: No focal deficit present.  ?   Mental Status: He is alert and oriented to person, place, and time. Mental status is at baseline.  ?Psychiatric:     ?   Mood and Affect: Mood normal.     ?   Behavior: Behavior normal.     ?   Thought Content: Thought content normal.     ?   Judgment: Judgment normal.  ? ? ?ED Results / Procedures / Treatments   ?Labs ?(all labs ordered are listed, but only abnormal results are displayed) ?Labs Reviewed - No data to display ? ?EKG ?None ? ?Radiology ?No results found. ? ?Procedures ?Procedures  ? ?Medications Ordered in ED ?Medications  ?lidocaine (PF) (XYLOCAINE) 1 % injection 30 mL (has no administration in time range)  ?oxyCODONE-acetaminophen (PERCOCET/ROXICET) 5-325 MG per tablet 1 tablet (has no administration in time range)  ? ? ?ED Course/ Medical Decision Making/ A&P ?  ?                        ?Medical Decision Making ?Risk ?Prescription drug management. ? ?This patient presents to the ED for concern of abscess, this involves an extensive number of treatment options, and is a complaint that carries with it a high risk of complications and morbidity.  The differential diagnosis includes abscess, cellulitis ? ?Co morbidities that complicate the patient evaluation ?Anxiety ? ?Additional history obtained:  ?Additional history obtained from: None ?External records from outside source obtained and reviewed including: None ? ?Medications  ?I ordered medication including Percocet for pain ?Reevaluation of the patient after medication shows that patient improved ? ?ED Course: ?41 year old male who presents to the emergency department with right axillary abscess. ? ?Physical exam is consistent with 1 to 2 cm abscess of the right axilla.  There is fluctuance without surrounding cellulitis.  He does have a central area to access for I&D. ? ?Given  Percocet for pain relief.  Attempted to drain the abscess but patient would not allow [place] to move forward given his fear of needles.  I have educated him that he will continue to have pain if this is not drained.  He continues to refuse to have a drain.  He states that he has been given an antibiotic yesterday urgent care but I cannot see this in his chart.  I prescribed him Bactrim for the next 7 days that he can take.  Also educated the use warm compresses.  Instructed return for worsening symptoms or fever.  He verbalized understand ? ?After consideration of the diagnostic results and the patients response to  treatment, I feel that the patent would benefit from discharge. ?The patient has been appropriately medically screened and/or stabilized in the ED. I have low suspicion for any other emergent medical condition which would require further screening, evaluation or treatment in the ED or require inpatient management. The patient is overall well appearing and non-toxic in appearance. They are hemodynamically stable at time of discharge.   ?Final Clinical Impression(s) / ED Diagnoses ?Final diagnoses:  ?Abscess  ? ? ?Rx / DC Orders ?ED Discharge Orders   ? ?      Ordered  ?  sulfamethoxazole-trimethoprim (BACTRIM DS) 800-160 MG tablet  2 times daily       ? 08/11/21 1422  ? ?  ?  ? ?  ? ? ?  ?Mickie Hillier, PA-C ?08/11/21 1430 ? ?  ?Godfrey Pick, MD ?08/12/21 0820 ? ?

## 2021-08-11 NOTE — ED Triage Notes (Addendum)
Pt brought in by [facility], lives at home. Right extremity pain r/t abcess, under armpit, near chest.  Seen at [facility] yesterday, Rx'd abx.  Pt called EMS bc anitbx is "not working." ? ?Vitals en route: ?BP 154/92, hr 54, resp 16, spo2 100% on RA. ? ?

## 2021-08-11 NOTE — Discharge Instructions (Signed)
You were seen in the emergency department today for an abscess.  While you were here you did not allow [place] to drain the abscess.  I am giving you antibiotics that you can take over the next week they will take twice a day.  If you are prescribed the same antibiotic at urgent care yesterday you can continue to take that 1 to completion.  You can use hot compresses as well over the area.  Please return if you decide to have it drained or symptoms are getting worse with fever. ?

## 2021-08-11 NOTE — ED Triage Notes (Addendum)
Patient BIB [facility] from Home with Abscess. ? ?Patient has 3-4 cm Abscess under Right Axillary Region. Present for approximately 2 days and worsening since.  ? ?No Known Fevers at Home. Given Antibiotics at Home yesterday. No Drainage.  ? ?NAD Noted during Triage. A&Ox4. GCS 15. Ambulatory. ?

## 2021-08-16 ENCOUNTER — Emergency Department (HOSPITAL_COMMUNITY)
Admission: EM | Admit: 2021-08-16 | Discharge: 2021-08-16 | Disposition: A | Discharge status: Home / Self Care | Payer: Medicare Other | Attending: Student | Admitting: Student

## 2021-08-16 ENCOUNTER — Encounter (HOSPITAL_COMMUNITY): Payer: Self-pay | Admitting: Emergency Medicine

## 2021-08-16 ENCOUNTER — Other Ambulatory Visit: Payer: Self-pay

## 2021-08-16 ENCOUNTER — Emergency Department (HOSPITAL_COMMUNITY): Payer: Medicare Other

## 2021-08-16 DIAGNOSIS — Z91040 Latex allergy status: Secondary | ICD-10-CM | POA: Diagnosis not present

## 2021-08-16 DIAGNOSIS — W07XXXA Fall from chair, initial encounter: Secondary | ICD-10-CM | POA: Insufficient documentation

## 2021-08-16 DIAGNOSIS — Y92009 Unspecified place in unspecified non-institutional (private) residence as the place of occurrence of the external cause: Secondary | ICD-10-CM | POA: Insufficient documentation

## 2021-08-16 DIAGNOSIS — M25562 Pain in left knee: Secondary | ICD-10-CM | POA: Diagnosis present

## 2021-08-16 MED ORDER — NAPROXEN 500 MG PO TABS: 500.0000 mg | ORAL_TABLET | Freq: Two times a day (BID) | ORAL | 0 refills | Status: DC

## 2021-08-16 MED ORDER — HYDROCODONE-ACETAMINOPHEN 5-325 MG PO TABS
1.0000 | ORAL_TABLET | Freq: Once | ORAL | Status: AC
  Administered 2021-08-16: 1 via ORAL
  Filled 2021-08-16: qty 1

## 2021-08-16 NOTE — ED Provider Notes (Signed)
?[facility] ?Provider Note ? ? ?CSN: [id number] ?Arrival date & time: 08/16/21  1713 ? ?  ? ?History ? ?Chief Complaint  ?Patient presents with  ? Knee Pain  ? ? ?Franklin [name] is a 41 y.o. male with chief complaint of acute left knee pain.  Patient was standing on chair when he fell and twisted his left knee.  Describes constant sharp pain.  Feels he is unable to bend or move his leg.  Denies numbness or tingling, or decreased sensation.  Denies skin color change or open wound.  Denies hearing or feeling a "pop" when he fell.  Denies hitting head or LOC.  No other areas of trauma. Not on anticoagulation. ? ?The history is provided by the patient and medical records.  ?Knee Pain ? ?  ? ?Home Medications ?Prior to Admission medications   ?Medication Sig Start Date End Date Taking? Authorizing Provider  ?naproxen (NAPROSYN) 500 MG tablet Take 1 tablet (500 mg total) by mouth 2 (two) times daily. 08/16/21  Yes Prince Rome, PA-C  ?albuterol (PROAIR HFA) 108 (90 Base) MCG/ACT inhaler Inhale 1-2 puffs into the lungs every 6 (six) hours as needed for wheezing or shortness of breath. 01/02/20   Zigmund Gottron, NP  ?atorvastatin (LIPITOR) 10 MG tablet Take 10 mg by mouth daily. 02/26/20   [provider]  ?chlorhexidine (HIBICLENS) 4 % external liquid Apply topically daily as needed. 01/28/21   Volney American, PA-C  ?famotidine (PEPCID) 20 MG tablet Take 1 tablet (20 mg total) by mouth 2 (two) times daily for 7 days. ?Patient taking differently: Take 20 mg by mouth 2 (two) times daily as needed for heartburn. 12/29/19 04/28/20  Darr, Edison Nasuti, PA-C  ?ibuprofen (ADVIL) 600 MG tablet Take 1 tablet (600 mg total) by mouth every 8 (eight) hours as needed (pain). 05/07/21   Barrett Henle, MD  ?lamoTRIgine (LAMICTAL) 100 MG tablet Take 100 mg by mouth daily.    [provider]  ?lamoTRIgine (LAMICTAL) 25 MG tablet Take 25 mg by mouth 2 (two) times daily.  03/20/20   [provider]  ?LORazepam (ATIVAN) 1 MG tablet Take 1 tablet (1 mg total) by mouth every 12 (twelve) hours as needed for anxiety. 05/03/21   Varney Biles, MD  ?Lumateperone Tosylate (CAPLYTA) 42 MG CAPS Take 42 mg by mouth daily. ?Patient not taking: No sig reported    [provider]  ?meclizine (ANTIVERT) 25 MG tablet Take 1 tablet (25 mg total) by mouth 3 (three) times daily as needed for dizziness. 08/08/20   Eustaquio Maize, PA-C  ?sulfamethoxazole-trimethoprim (BACTRIM DS) 800-160 MG tablet Take 1 tablet by mouth 2 (two) times daily for 7 days. 08/11/21 08/18/21  Mickie Hillier, PA-C  ?tiZANidine (ZANAFLEX) 4 MG tablet Take 1 tablet (4 mg total) by mouth every 8 (eight) hours as needed for muscle spasms. 01/05/21   Melynda Ripple, MD  ?diphenhydrAMINE (BENADRYL) 25 MG tablet Take 2 tablets (50 mg total) by mouth at bedtime as needed for up to 7 days. 12/29/19 02/15/20  Darr, Edison Nasuti, PA-C  ?loratadine (CLARITIN) 10 MG tablet Take 1 tablet (10 mg total) by mouth daily. ?Patient not taking: Reported on 10/11/2019 09/18/19 12/02/19  Charlott Rakes, MD  ?Oxcarbazepine (TRILEPTAL) 300 MG tablet Take 1/2 tablet twice a day ?Patient not taking: Reported on 07/08/2019 01/26/18 07/09/19  Argentina Donovan, PA-C  ?   ? ?Allergies    ?Coconut oil, Other, Codeine, Depakote [divalproex sodium],  Hydroxyzine, Penicillins, Tape, Keflex [cephalexin], Latex, and Levetiracetam   ? ?Review of Systems   ?Review of Systems  ?Musculoskeletal:   ?     Left knee pain  ? ?Physical Exam ?Updated Vital Signs ?BP 106/73 (BP Location: Left Arm)   Pulse (!) 51   Temp 98.5 ?F (36.9 ?C) (Oral)   Resp 16   SpO2 100%  ?Physical Exam ?Vitals and nursing note reviewed.  ?Constitutional:   ?   General: He is not in acute distress. ?   Appearance: Normal appearance. He is well-developed. He is not ill-appearing or diaphoretic.  ?HENT:  ?   Head: Normocephalic and atraumatic.  ?Eyes:  ?   Conjunctiva/sclera: Conjunctivae  normal.  ?Cardiovascular:  ?   Rate and Rhythm: Normal rate and regular rhythm.  ?   Pulses: Normal pulses.  ?   Heart sounds: No murmur heard. ?Pulmonary:  ?   Effort: Pulmonary effort is normal. No respiratory distress.  ?   Breath sounds: Normal breath sounds.  ?Abdominal:  ?   Palpations: Abdomen is soft.  ?   Tenderness: There is no abdominal tenderness.  ?Musculoskeletal:     ?   General: No swelling.  ?   Cervical back: Neck supple.  ?     Legs: ? ?   Comments: TTP as indicated above ?Lower extremities neurovascularly intact ?Negative for discoloration, obvious deformity, laceration, abrasion, effusion, or ecchymosis ?Bony tenderness appreciated  ?Skin: ?   General: Skin is warm and dry.  ?   Capillary Refill: Capillary refill takes less than 2 seconds.  ?Neurological:  ?   Mental Status: He is alert and oriented to person, place, and time.  ?   Sensory: No sensory deficit.  ?Psychiatric:     ?   Mood and Affect: Mood normal.  ? ? ?ED Results / Procedures / Treatments   ?Labs ?(all labs ordered are listed, but only abnormal results are displayed) ?Labs Reviewed - No data to display ? ?EKG ?None ? ?Radiology ?CT Knee Left Wo Contrast ? ?Result Date: 08/16/2021 ?CLINICAL DATA:  Left knee trauma, occult fracture suspected. EXAM: CT OF THE LEFT KNEE WITHOUT CONTRAST TECHNIQUE: Multidetector CT imaging of the left knee was performed according to the standard protocol. Multiplanar CT image reconstructions were also generated. RADIATION DOSE REDUCTION: This exam was performed according to the departmental dose-optimization program which includes automated exposure control, adjustment of the mA and/or kV according to patient size and/or use of iterative reconstruction technique. COMPARISON:  08/16/2021. FINDINGS: Bones/Joint/Cartilage No acute fracture or dislocation. Joint space narrowing, subchondral sclerosis, and osteophyte formation is noted predominantly in the medial compartment. The patella appears high  riding. No significant joint effusion. Ligaments Suboptimally assessed by CT. Muscles and Tendons The quadriceps and patellar tendons appear intact. No muscular atrophy or hematoma. Soft tissues Mild subcutaneous fat stranding is noted anterior to the knee. IMPRESSION: 1. No acute fracture or dislocation. 2. Mild-to-moderate degenerative changes, most pronounced in the medial compartment. 3. High-riding patella. Electronically Signed   By: Brett Fairy M.D.   On: 08/16/2021 21:09  ? ?DG Knee Complete 4 Views Left ? ?Result Date: 08/16/2021 ?CLINICAL DATA:  Fall, left knee pain EXAM: LEFT KNEE - COMPLETE 4+ VIEW COMPARISON:  None. FINDINGS: No fracture or dislocation is seen. High riding patella, although this does not meet criteria for patella alta, and there are no associated traumatic findings. The joint spaces are preserved. Visualized soft tissues are within normal limits. No suprapatellar knee joint  effusion. IMPRESSION: Negative. Electronically Signed   By: Julian Hy M.D.   On: 08/16/2021 19:10   ? ?Procedures ?Procedures  ? ? ?Medications Ordered in ED ?Medications  ?HYDROcodone-acetaminophen (NORCO/VICODIN) 5-325 MG per tablet 1 tablet (1 tablet Oral Given 08/16/21 2114)  ? ? ?ED Course/ Medical Decision Making/ A&P ?  ?                        ?Medical Decision Making ?Amount and/or Complexity of Data Reviewed ?External Data Reviewed: notes. ?Labs:  Decision-making details documented in ED Course. ?Radiology: ordered and independent interpretation performed. Decision-making details documented in ED Course. ?ECG/medicine tests:  Decision-making details documented in ED Course. ? ?Risk ?OTC drugs. ?Prescription drug management. ? ? ?41 y.o. male with chief complaint of acute left knee pain.  Patient was standing on chair when he fell and twisted his left knee.  Describes constant excruciating pain.  States he is unable to bend or move his leg.  Denies numbness or tingling, or decreased sensation.   Denies skin color change or open wound.  Denies hearing or feeling a "pop" when he fell.  Denies hitting head or LOC.  No other areas of trauma. Not on anticoagulation. ? ?Personally ordered and interpreted imaging of the lef

## 2021-08-16 NOTE — ED Provider Triage Note (Signed)
Emergency Medicine Provider Triage Evaluation Note ? ?Franklin [name] , a 41 y.o. male  was evaluated in triage.  Pt complains of left knee pain.  He states that he was standing on a chair when he fell injuring his left knee.  He has had difficulty walking since.  He denies striking his head or any other injuries.  No numbness per his report. ? ? ? ?Physical Exam  ?BP 106/73 (BP Location: Left Arm)   Pulse (!) 51   Temp 98.5 ?F (36.9 ?C) (Oral)   Resp 16   SpO2 100%  ?Gen:   Awake, no distress   ?Resp:  Normal effort  ?MSK:   Sharp pain in the left knee anteriorly when attempting to move his leg.  Is able to dorsiflex and plantarflex left ankle/foot. ?Other:  Sensation intact to light touch to distal left leg.  2+ left PT pulse. ? ?Medical Decision Making  ?Medically screening exam initiated at 5:46 PM.  Appropriate orders placed.  Franklin [name] was informed that the remainder of the evaluation will be completed by another provider, this initial triage assessment does not replace that evaluation, and the importance of remaining in the ED until their evaluation is complete. ? ? ?  ?Lorin Glass, [place] ?08/16/21 1749 ? ?

## 2021-08-16 NOTE — ED Triage Notes (Signed)
Patient BIB [facility] from home with complaint of falling off of a chair and landing on his left knee. Patient was standing in a chair trying to hang something on a wall when he fell. Patient is alert, oriented and in no apparent distress at hits time. ? ?BP 158/76 ?HR 48 ?RR 18 ?SpO2 96% on room air ?

## 2021-08-16 NOTE — ED Notes (Signed)
Patient verbalizes understanding of d/c instructions. Opportunities for questions and answers were provided. Pt d/c from ED and wheeled to lobby.  

## 2021-08-16 NOTE — Discharge Instructions (Addendum)
You have been provided with a knee brace and crutches.  Please utilize these until you are able to follow-up with orthopedics ? ?A prescription for naproxen has been sent to your pharmacy.  You may take 1 tablet every 12 hours as needed for pain ? ?Return to the ED for new or worsening symptoms as discussed ?

## 2021-08-17 NOTE — Progress Notes (Signed)
Orthopedic Tech Progress Note ?Patient Details:  ?Franklin [name] ?1981/01/07 ?[id number] ? ?Ortho Devices ?Type of Ortho Device: Crutches, Knee Immobilizer ?Ortho Device/Splint Location: lle ?Ortho Device/Splint Interventions: Ordered, Application, Adjustment ?  ?Post Interventions ?Patient Tolerated: Well ?Instructions Provided: Care of device, Adjustment of device ? ?Karolee Stamps ?08/17/2021, 12:30 AM ? ?

## 2021-10-04 ENCOUNTER — Encounter (HOSPITAL_COMMUNITY): Payer: Self-pay

## 2021-10-04 ENCOUNTER — Other Ambulatory Visit: Payer: Self-pay

## 2021-10-04 ENCOUNTER — Emergency Department (HOSPITAL_COMMUNITY): Payer: Medicare Other

## 2021-10-04 ENCOUNTER — Emergency Department (HOSPITAL_COMMUNITY)
Admission: EM | Admit: 2021-10-04 | Discharge: 2021-10-04 | Disposition: A | Discharge status: Home / Self Care | Payer: Medicare Other | Attending: Emergency Medicine | Admitting: Emergency Medicine

## 2021-10-04 DIAGNOSIS — Z91040 Latex allergy status: Secondary | ICD-10-CM | POA: Diagnosis not present

## 2021-10-04 DIAGNOSIS — R569 Unspecified convulsions: Secondary | ICD-10-CM | POA: Diagnosis not present

## 2021-10-04 LAB — URINALYSIS, ROUTINE W REFLEX MICROSCOPIC
Bilirubin Urine: NEGATIVE
Glucose, UA: NEGATIVE mg/dL
Hgb urine dipstick: NEGATIVE
Ketones, ur: NEGATIVE mg/dL
Leukocytes,Ua: NEGATIVE
Nitrite: NEGATIVE
Protein, ur: NEGATIVE mg/dL
Specific Gravity, Urine: 1.026 (ref 1.005–1.030)
pH: 6 (ref 5.0–8.0)

## 2021-10-04 LAB — CBC WITH DIFFERENTIAL/PLATELET
Abs Immature Granulocytes: 0.02 10*3/uL (ref 0.00–0.07)
Basophils Absolute: 0.1 10*3/uL (ref 0.0–0.1)
Basophils Relative: 1 %
Eosinophils Absolute: 0 10*3/uL (ref 0.0–0.5)
Eosinophils Relative: 1 %
HCT: 42.3 % (ref 39.0–52.0)
Hemoglobin: 14.9 g/dL (ref 13.0–17.0)
Immature Granulocytes: 0 %
Lymphocytes Relative: 22 %
Lymphs Abs: 1.8 10*3/uL (ref 0.7–4.0)
MCH: 30.8 pg (ref 26.0–34.0)
MCHC: 35.2 g/dL (ref 30.0–36.0)
MCV: 87.6 fL (ref 80.0–100.0)
Monocytes Absolute: 0.8 10*3/uL (ref 0.1–1.0)
Monocytes Relative: 10 %
Neutro Abs: 5.3 10*3/uL (ref 1.7–7.7)
Neutrophils Relative %: 66 %
Platelets: 191 10*3/uL (ref 150–400)
RBC: 4.83 MIL/uL (ref 4.22–5.81)
RDW: 12.4 % (ref 11.5–15.5)
WBC: 7.9 10*3/uL (ref 4.0–10.5)
nRBC: 0 % (ref 0.0–0.2)

## 2021-10-04 LAB — COMPREHENSIVE METABOLIC PANEL
ALT: 18 U/L (ref 0–44)
AST: 19 U/L (ref 15–41)
Albumin: 4.2 g/dL (ref 3.5–5.0)
Alkaline Phosphatase: 59 U/L (ref 38–126)
Anion gap: 6 (ref 5–15)
BUN: 17 mg/dL (ref 6–20)
CO2: 26 mmol/L (ref 22–32)
Calcium: 10.2 mg/dL (ref 8.9–10.3)
Chloride: 109 mmol/L (ref 98–111)
Creatinine, Ser: 1.05 mg/dL (ref 0.61–1.24)
GFR, Estimated: >60 mL/min (ref >60)
Glucose, Bld: 96 mg/dL (ref 70–99)
Potassium: 4.4 mmol/L (ref 3.5–5.1)
Sodium: 141 mmol/L (ref 135–145)
Total Bilirubin: 2 mg/dL — ABNORMAL HIGH (ref 0.3–1.2)
Total Protein: 6.9 g/dL (ref 6.5–8.1)

## 2021-10-04 LAB — RAPID URINE DRUG SCREEN, HOSP PERFORMED
Amphetamines: NOT DETECTED
Barbiturates: NOT DETECTED
Benzodiazepines: NOT DETECTED
Cocaine: NOT DETECTED
Opiates: NOT DETECTED
Tetrahydrocannabinol: NOT DETECTED

## 2021-10-04 LAB — MAGNESIUM: Magnesium: 2.2 mg/dL (ref 1.7–2.4)

## 2021-10-04 LAB — ETHANOL: Alcohol, Ethyl (B): <10 mg/dL (ref <10)

## 2021-10-04 NOTE — ED Notes (Signed)
Pt called out stating he needed to urinate.  RN was attempting to take a urinal into the room however pt's visitor stood in the door way blocking entry.  RN asked for room to enter room to give urinal to pt.  Visitor moved and allowed RN to enter to hand urinal to pt.   No other needs reported.   ?
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
## 2021-10-04 NOTE — ED Notes (Addendum)
Pt called out stating he wants to leave.  RN informed provider who stated she would be in to see pt shortly.  RN informed pt and visitor.  Pt's visitor expressed dissatisfaction w/ staff stating everybody "was rude".  RN inquired as to any other needs but pt denied any needs.   ?

## 2021-10-04 NOTE — ED Triage Notes (Signed)
Pt BIB EMS after having seizure like activity. EMS states that pt has periods of zoning out and will not answer questions or react to stimuli, but will be fine and axox4 moments later.  ? ?VSS w/ EMS  ?

## 2021-10-04 NOTE — ED Notes (Signed)
Pt alert and oriented, given food per provider. Waiting for CT ?

## 2021-10-04 NOTE — ED Notes (Signed)
Pt's visitor standing at door of room requesting provider.  RN explained that the provider was in w/ another patient.  Pt does not seem to be in any distress at this time, no other needs expressed.   ?

## 2021-10-25 ENCOUNTER — Emergency Department (HOSPITAL_COMMUNITY): Payer: Medicare Other

## 2021-10-25 ENCOUNTER — Encounter (HOSPITAL_COMMUNITY): Payer: Self-pay | Admitting: Emergency Medicine

## 2021-10-25 ENCOUNTER — Emergency Department (HOSPITAL_COMMUNITY)
Admission: EM | Admit: 2021-10-25 | Discharge: 2021-10-25 | Disposition: A | Discharge status: Home / Self Care | Payer: Medicare Other | Attending: Emergency Medicine | Admitting: Emergency Medicine

## 2021-10-25 ENCOUNTER — Other Ambulatory Visit: Payer: Self-pay

## 2021-10-25 DIAGNOSIS — Z91040 Latex allergy status: Secondary | ICD-10-CM | POA: Diagnosis not present

## 2021-10-25 DIAGNOSIS — R1084 Generalized abdominal pain: Secondary | ICD-10-CM | POA: Insufficient documentation

## 2021-10-25 DIAGNOSIS — R109 Unspecified abdominal pain: Secondary | ICD-10-CM | POA: Diagnosis present

## 2021-10-25 LAB — CBC WITH DIFFERENTIAL/PLATELET
Abs Immature Granulocytes: 0.01 10*3/uL (ref 0.00–0.07)
Basophils Absolute: 0 10*3/uL (ref 0.0–0.1)
Basophils Relative: 1 %
Eosinophils Absolute: 0 10*3/uL (ref 0.0–0.5)
Eosinophils Relative: 0 %
HCT: 44.5 % (ref 39.0–52.0)
Hemoglobin: 14.9 g/dL (ref 13.0–17.0)
Immature Granulocytes: 0 %
Lymphocytes Relative: 31 %
Lymphs Abs: 1.5 10*3/uL (ref 0.7–4.0)
MCH: 30.2 pg (ref 26.0–34.0)
MCHC: 33.5 g/dL (ref 30.0–36.0)
MCV: 90.3 fL (ref 80.0–100.0)
Monocytes Absolute: 0.4 10*3/uL (ref 0.1–1.0)
Monocytes Relative: 9 %
Neutro Abs: 2.8 10*3/uL (ref 1.7–7.7)
Neutrophils Relative %: 59 %
Platelets: 180 10*3/uL (ref 150–400)
RBC: 4.93 MIL/uL (ref 4.22–5.81)
RDW: 12.3 % (ref 11.5–15.5)
WBC: 4.8 10*3/uL (ref 4.0–10.5)
nRBC: 0 % (ref 0.0–0.2)

## 2021-10-25 LAB — COMPREHENSIVE METABOLIC PANEL
ALT: 15 U/L (ref 0–44)
AST: 18 U/L (ref 15–41)
Albumin: 4 g/dL (ref 3.5–5.0)
Alkaline Phosphatase: 52 U/L (ref 38–126)
Anion gap: 5 (ref 5–15)
BUN: 10 mg/dL (ref 6–20)
CO2: 29 mmol/L (ref 22–32)
Calcium: 9.2 mg/dL (ref 8.9–10.3)
Chloride: 106 mmol/L (ref 98–111)
Creatinine, Ser: 1.03 mg/dL (ref 0.61–1.24)
GFR, Estimated: >60 mL/min (ref >60)
Glucose, Bld: 84 mg/dL (ref 70–99)
Potassium: 3.8 mmol/L (ref 3.5–5.1)
Sodium: 140 mmol/L (ref 135–145)
Total Bilirubin: 1.2 mg/dL (ref 0.3–1.2)
Total Protein: 7 g/dL (ref 6.5–8.1)

## 2021-10-25 LAB — LIPASE, BLOOD: Lipase: 27 U/L (ref 11–51)

## 2021-10-25 MED ORDER — KETOROLAC TROMETHAMINE 60 MG/2ML IM SOLN
60.0000 mg | Freq: Once | INTRAMUSCULAR | Status: DC
  Filled 2021-10-25: qty 2

## 2021-10-25 MED ORDER — NAPROXEN 500 MG PO TABS: 500.0000 mg | ORAL_TABLET | Freq: Two times a day (BID) | ORAL | 0 refills | Status: DC

## 2021-10-25 MED ORDER — ONDANSETRON 4 MG PO TBDP
4.0000 mg | ORAL_TABLET | Freq: Once | ORAL | Status: AC
  Administered 2021-10-25: 4 mg via ORAL
  Filled 2021-10-25: qty 1

## 2021-10-25 MED ORDER — ONDANSETRON 4 MG PO TBDP: 4.0000 mg | ORAL_TABLET | Freq: Three times a day (TID) | ORAL | 0 refills | Status: DC | PRN

## 2021-10-25 NOTE — ED Notes (Signed)
Pt verbalizes understanding of discharge instructions. Opportunity for questions and answers were provided. Pt discharged from the ED.   ?

## 2021-10-25 NOTE — ED Triage Notes (Signed)
Pt BIB [facility] from a motel, initially called out for possible choking but pt was in NAD when EMS arrived. On scene pt reported abdominal pain that started after eating. Endorses vomiting x1, denies N/D.

## 2021-10-25 NOTE — ED Provider Notes (Signed)
[facility] EMERGENCY DEPARTMENT Provider Note   CSN: [id number] Arrival date & time: 10/25/21  1318     History  Chief Complaint  Patient presents with   Abdominal Pain    Franklin [name] is a 41 y.o. male.   Abdominal Pain  This patient is a 41 year old male, presents to the hospital to the emergency department for a complaint of some abdominal discomfort.  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  He reports that after eating some very salty meat that somebody made him a couple of hours ago he started to have some abdominal discomfort and had 1 episode of vomiting.  He denies having any diarrhea, he denies having any urinary symptoms and has had no fevers or chills.  This started all within the last 2 hours and has been constant.  Home Medications Prior to Admission medications   Medication Sig Start Date End Date Taking? Authorizing Provider  naproxen (NAPROSYN) 500 MG tablet Take 1 tablet (500 mg total) by mouth 2 (two) times daily with a meal. 10/25/21  Yes Noemi Chapel, MD  ondansetron (ZOFRAN-ODT) 4 MG disintegrating tablet Take 1 tablet (4 mg total) by mouth every 8 (eight) hours as needed for nausea. 10/25/21  Yes Noemi Chapel, MD  albuterol [facility] HFA) 108 [phone] Base) MCG/ACT inhaler Inhale 1-2 puffs into the lungs every 6 (six) hours as needed for wheezing or shortness of breath. 01/02/20   Zigmund Gottron, NP  atorvastatin (LIPITOR) 10 MG tablet Take 10 mg by mouth daily. 02/26/20   [provider]  chlorhexidine (HIBICLENS) 4 % external liquid Apply topically daily as needed. 01/28/21   Volney American, PA-C  famotidine (PEPCID) 20 MG tablet Take 1 tablet (20 mg total) by mouth 2 (two) times daily for 7 days. Patient  taking differently: Take 20 mg by mouth 2 (two) times daily as needed for heartburn. 12/29/19 04/28/20  Darr, Edison Nasuti, PA-C  ibuprofen (ADVIL) 600 MG tablet Take 1 tablet (600 mg total) by mouth every 8 (eight) hours as needed (pain). 05/07/21   Barrett Henle, MD  lamoTRIgine (LAMICTAL) 100 MG tablet Take 100 mg by mouth daily.    [provider]  lamoTRIgine (LAMICTAL) 25 MG tablet Take 25 mg by mouth 2 (two) times daily. 03/20/20   [provider]  LORazepam (ATIVAN) 1 MG tablet Take 1 tablet (1 mg total) by mouth every 12 (twelve) hours as needed for anxiety. 05/03/21   Varney Biles, MD  Lumateperone Tosylate (CAPLYTA) 42 MG CAPS Take 42 mg by mouth daily. Patient not taking: No sig reported    [provider]  meclizine (ANTIVERT) 25 MG tablet Take 1 tablet (25 mg total) by mouth 3 (three) times daily as needed for dizziness. 08/08/20   Alroy Bailiff, Margaux, PA-C  tiZANidine (ZANAFLEX) 4 MG tablet Take 1 tablet (4 mg total) by mouth every 8 (eight) hours as needed for muscle spasms. 01/05/21   Melynda Ripple, MD  diphenhydrAMINE (BENADRYL) 25 MG tablet Take 2 tablets (50 mg total) by mouth at bedtime as needed for up to 7 days. 12/29/19 02/15/20  Darr, Edison Nasuti, PA-C  loratadine (CLARITIN) 10 MG tablet Take 1 tablet (10 mg total) by mouth daily. Patient not taking: Reported on 10/11/2019 09/18/19 12/02/19  Charlott Rakes, MD  Oxcarbazepine (TRILEPTAL)  300 MG tablet Take 1/2 tablet twice a day Patient not taking: Reported on 07/08/2019 01/26/18 07/09/19  Argentina Donovan, PA-C      Allergies    Coconut (cocos nucifera), Other, Codeine, Depakote [divalproex sodium], Hydroxyzine, Penicillins, Tape, Keflex [cephalexin], Latex, and Levetiracetam    Review of Systems   Review of Systems  Gastrointestinal:  Positive for abdominal pain.  All other systems reviewed and are negative.  Physical Exam Updated Vital Signs BP 128/79   Pulse (!) 50   Temp 98.2 F (36.8 C) (Oral)   Resp  18   Ht 1.829 m (6')   Wt 70.3 kg   SpO2 99%   BMI 21.02 kg/m  Physical Exam Vitals and nursing note reviewed.  Constitutional:      General: He is not in acute distress.    Appearance: He is well-developed.  HENT:     Head: Normocephalic and atraumatic.     Mouth/Throat:     Pharynx: No oropharyngeal exudate.  Eyes:     General: No scleral icterus.       Right eye: No discharge.        Left eye: No discharge.     Conjunctiva/sclera: Conjunctivae normal.     Pupils: Pupils are equal, round, and reactive to light.  Neck:     Thyroid: No thyromegaly.     Vascular: No JVD.  Cardiovascular:     Rate and Rhythm: Normal rate and regular rhythm.     Heart sounds: Normal heart sounds. No murmur heard.   No friction rub. No gallop.  Pulmonary:     Effort: Pulmonary effort is normal. No respiratory distress.     Breath sounds: Normal breath sounds. No wheezing or rales.  Abdominal:     General: Bowel sounds are normal. There is no distension.     Palpations: Abdomen is soft. There is no mass.     Tenderness: There is abdominal tenderness.     Comments: Mild diffuse tenderness from epigastrium through suprapubic region, soft, nonperitoneal  Musculoskeletal:        General: No tenderness. Normal range of motion.     Cervical back: Normal range of motion and neck supple.  Lymphadenopathy:     Cervical: No cervical adenopathy.  Skin:    General: Skin is warm and dry.     Findings: No erythema or rash.  Neurological:     Mental Status: He is alert.     Coordination: Coordination normal.  Psychiatric:        Behavior: Behavior normal.    ED Results / Procedures / Treatments   Labs (all labs ordered are listed, but only abnormal results are displayed) Labs Reviewed  CBC WITH DIFFERENTIAL/PLATELET  COMPREHENSIVE METABOLIC PANEL  LIPASE, BLOOD    EKG None  Radiology US Abdomen Limited RUQ (LIVER/GB)  Result Date: 10/25/2021 CLINICAL DATA:  Generalized abdominal pain  EXAM: ULTRASOUND ABDOMEN LIMITED RIGHT UPPER QUADRANT COMPARISON:  04/09/2020 FINDINGS: Gallbladder: 7 mm shadowing gallstone identified in the gallbladder fundus. No gallbladder wall thickening or pericholecystic fluid. Negative sonographic Murphy sign. Common bile duct: Diameter: 3 mm Liver: No focal lesion identified. Within normal limits in parenchymal echogenicity. Portal vein is patent on color Doppler imaging with normal direction of blood flow towards the liver. Other: None. IMPRESSION: 1. Cholelithiasis without evidence of cholecystitis. Electronically Signed   By: Randa Ngo M.D.   On: 10/25/2021 14:58    Procedures Procedures    Medications Ordered in ED Medications  ketorolac (TORADOL) injection 60 mg (60 mg Intramuscular Not Given 10/25/21 1401)  ondansetron (ZOFRAN-ODT) disintegrating tablet 4 mg (4 mg Oral Given 10/25/21 1400)    ED Course/ Medical Decision Making/ A&P                           Medical Decision Making Amount and/or Complexity of Data Reviewed Labs: ordered. Radiology: ordered.  Risk Prescription drug management.   This patient presents to the ED for concern of abdominal pain differential diagnosis includes acute cholecystitis, pancreatitis, gastroenteritis, cystitis, food poisoning    Additional history obtained:  Additional history obtained from prior medical record External records from outside source obtained and reviewed including prior EEG showing the patient did not have any acute seizure-like activity   Lab Tests:  I Ordered, and personally interpreted labs.  The pertinent results include: CBC metabolic panel lipase and urinalysis which show CBC metabolic panel and lipase are all normal   Imaging Studies ordered:  I ordered imaging studies including ultrasound of the right upper quadrant I independently visualized and interpreted imaging which showed no acute findings other than cholelithiasis but no signs of cholecystitis and the  patient does not have a positive Murphy sign I agree with the radiologist interpretation   Medicines ordered and prescription drug management:  I ordered medication including Toradol, Zofran for pain and nausea Reevaluation of the patient after these medicines showed that the patient improved I have reviewed the patients home medicines and have made adjustments as needed   Problem List / ED Course:  Patient improved, no findings of acute surgical pathology, stable for discharge  I have discussed with the patient at the bedside the results, and the meaning of these results.  They have expressed her understanding to the need for follow-up with primary care physician           Final Clinical Impression(s) / ED Diagnoses Final diagnoses:  Generalized abdominal pain    Rx / DC Orders ED Discharge Orders          Ordered    naproxen (NAPROSYN) 500 MG tablet  2 times daily with meals        10/25/21 1527    ondansetron (ZOFRAN-ODT) 4 MG disintegrating tablet  Every 8 hours PRN        10/25/21 1527              Noemi Chapel, MD 10/25/21 1529

## 2021-10-25 NOTE — Discharge Instructions (Addendum)
Your testing was normal, no signs of surgical causes, your ultrasound was fine, ER for severe worsening symptoms  Please take Naprosyn, '500mg'$  by mouth twice daily as needed for pain - this in an antiinflammatory medicine (NSAID) and is similar to ibuprofen - many people feel that it is stronger than ibuprofen and it is easier to take since it is a smaller pill.  Please use this only for 1 week - if your pain persists, you will need to follow up with your doctor in the office for ongoing guidance and pain control.  Zofran is a medication which can help with nausea.  You may take 4 mg by mouth every 6 hours as needed if you are an adult, if your child under the age of 6 take half of a tablet or 2 mg every 6 hours as needed.  This should dissolve on your tongue within a short timeframe.  Wait about 30 minutes after taking it to help with drinking clear liquids.  Insert follow-up

## 2021-11-22 ENCOUNTER — Encounter (HOSPITAL_COMMUNITY): Payer: Self-pay

## 2021-11-22 ENCOUNTER — Ambulatory Visit (HOSPITAL_COMMUNITY)
Admission: EM | Admit: 2021-11-22 | Discharge: 2021-11-22 | Disposition: A | Discharge status: Home / Self Care | Payer: Medicare Other | Attending: Emergency Medicine | Admitting: Emergency Medicine

## 2021-11-22 DIAGNOSIS — L02416 Cutaneous abscess of left lower limb: Secondary | ICD-10-CM

## 2021-11-22 MED ORDER — DOXYCYCLINE HYCLATE 100 MG PO CAPS: 100.0000 mg | ORAL_CAPSULE | Freq: Two times a day (BID) | ORAL | 0 refills | Status: DC

## 2021-11-22 MED ORDER — IBUPROFEN 800 MG PO TABS: 800.0000 mg | ORAL_TABLET | Freq: Three times a day (TID) | ORAL | 0 refills | Status: DC

## 2021-12-11 ENCOUNTER — Emergency Department (HOSPITAL_COMMUNITY)
Admission: EM | Admit: 2021-12-11 | Discharge: 2021-12-11 | Disposition: A | Discharge status: Home / Self Care | Payer: Medicare Other | Attending: Emergency Medicine | Admitting: Emergency Medicine

## 2021-12-11 DIAGNOSIS — Z91040 Latex allergy status: Secondary | ICD-10-CM | POA: Insufficient documentation

## 2021-12-11 DIAGNOSIS — R569 Unspecified convulsions: Secondary | ICD-10-CM | POA: Diagnosis present

## 2021-12-11 DIAGNOSIS — R258 Other abnormal involuntary movements: Secondary | ICD-10-CM | POA: Diagnosis not present

## 2021-12-11 LAB — CBC WITH DIFFERENTIAL/PLATELET
Abs Immature Granulocytes: 0.01 10*3/uL (ref 0.00–0.07)
Basophils Absolute: 0 10*3/uL (ref 0.0–0.1)
Basophils Relative: 1 %
Eosinophils Absolute: 0 10*3/uL (ref 0.0–0.5)
Eosinophils Relative: 1 %
HCT: 42.2 % (ref 39.0–52.0)
Hemoglobin: 14.4 g/dL (ref 13.0–17.0)
Immature Granulocytes: 0 %
Lymphocytes Relative: 30 %
Lymphs Abs: 1.8 10*3/uL (ref 0.7–4.0)
MCH: 30.1 pg (ref 26.0–34.0)
MCHC: 34.1 g/dL (ref 30.0–36.0)
MCV: 88.3 fL (ref 80.0–100.0)
Monocytes Absolute: 0.5 10*3/uL (ref 0.1–1.0)
Monocytes Relative: 8 %
Neutro Abs: 3.5 10*3/uL (ref 1.7–7.7)
Neutrophils Relative %: 60 %
Platelets: 196 10*3/uL (ref 150–400)
RBC: 4.78 MIL/uL (ref 4.22–5.81)
RDW: 12.2 % (ref 11.5–15.5)
WBC: 5.9 10*3/uL (ref 4.0–10.5)
nRBC: 0 % (ref 0.0–0.2)

## 2021-12-11 LAB — BASIC METABOLIC PANEL
Anion gap: 12 (ref 5–15)
BUN: 21 mg/dL — ABNORMAL HIGH (ref 6–20)
CO2: 24 mmol/L (ref 22–32)
Calcium: 9.8 mg/dL (ref 8.9–10.3)
Chloride: 105 mmol/L (ref 98–111)
Creatinine, Ser: 1.02 mg/dL (ref 0.61–1.24)
GFR, Estimated: >60 mL/min (ref >60)
Glucose, Bld: 81 mg/dL (ref 70–99)
Potassium: 3.9 mmol/L (ref 3.5–5.1)
Sodium: 141 mmol/L (ref 135–145)

## 2021-12-11 NOTE — ED Triage Notes (Signed)
Pt from home via [facility] for witnessed seizure, lasting approx 15-30sec. Per landlady, pt was "foaming at the mouth, contracting on bed." Post-ictal on EMS arrival, lungs clear en route. Pt refusing IV/meds  Per pt, "was taken off meds awhile ago by neurologist," unsure why  134/80 HR 62 CBG 108

## 2021-12-11 NOTE — Discharge Instructions (Signed)
Return for any problem.  ?

## 2021-12-11 NOTE — ED Provider Notes (Signed)
[facility] EMERGENCY DEPARTMENT Provider Note   CSN: [id number] Arrival date & time: 12/11/21  1806     History  Chief Complaint  Patient presents with   Seizures    Franklin [name] is a 41 y.o. male.  41 year old male with prior medical history as detailed below presents by EMS after possible seizure.  Patient is not postictal on arrival.  He is comfortable and without complaint.  Patient apparently was having an argument with his landlady about his rent and whether the air conditioning should be on or off.  Patient with longstanding history of nonepileptic seizures.  These are typically precipitated by stress and anxiety.  Patient admits that the events of the evening were very stressful given his difficulties with his landlady.  Patient denies recent alcohol use.  He denies drug use.  He reports that he is not on any antiepileptic medications.  The history is provided by the patient and medical records.  Seizures Seizure activity on arrival: no        Home Medications Prior to Admission medications   Medication Sig Start Date End Date Taking? Authorizing Provider  albuterol (PROAIR HFA) 108 (90 Base) MCG/ACT inhaler Inhale 1-2 puffs into the lungs every 6 (six) hours as needed for wheezing or shortness of breath. 01/02/20   Zigmund Gottron, NP  atorvastatin (LIPITOR) 10 MG tablet Take 10 mg by mouth daily. 02/26/20   [provider]  chlorhexidine (HIBICLENS) 4 % external liquid Apply topically daily as needed. 01/28/21   Volney American, PA-C  doxycycline (VIBRAMYCIN) 100 MG capsule Take 1 capsule (100 mg total) by mouth 2 (two) times daily. 11/22/21   White, Leitha Schuller, NP  famotidine (PEPCID) 20 MG tablet Take 1 tablet (20 mg total) by mouth 2 (two) times daily for 7 days. Patient taking differently: Take 20 mg by mouth 2 (two) times daily as needed for heartburn. 12/29/19 04/28/20  Darr, Edison Nasuti, PA-C  ibuprofen (ADVIL) 800 MG tablet  Take 1 tablet (800 mg total) by mouth 3 (three) times daily. 11/22/21   Hans Eden, NP  lamoTRIgine (LAMICTAL) 100 MG tablet Take 100 mg by mouth daily.    [provider]  lamoTRIgine (LAMICTAL) 25 MG tablet Take 25 mg by mouth 2 (two) times daily. 03/20/20   [provider]  LORazepam (ATIVAN) 1 MG tablet Take 1 tablet (1 mg total) by mouth every 12 (twelve) hours as needed for anxiety. 05/03/21   Varney Biles, MD  Lumateperone Tosylate (CAPLYTA) 42 MG CAPS Take 42 mg by mouth daily. Patient not taking: No sig reported    [provider]  meclizine (ANTIVERT) 25 MG tablet Take 1 tablet (25 mg total) by mouth 3 (three) times daily as needed for dizziness. 08/08/20   Alroy Bailiff, Margaux, PA-C  naproxen (NAPROSYN) 500 MG tablet Take 1 tablet (500 mg total) by mouth 2 (two) times daily with a meal. 10/25/21   Noemi Chapel, MD  ondansetron (ZOFRAN-ODT) 4 MG disintegrating tablet Take 1 tablet (4 mg total) by mouth every 8 (eight) hours as needed for nausea. 10/25/21   Noemi Chapel, MD  tiZANidine (ZANAFLEX) 4 MG tablet Take 1 tablet (4 mg total) by mouth every 8 (eight) hours as needed for muscle spasms. 01/05/21   Melynda Ripple, MD  diphenhydrAMINE (BENADRYL) 25 MG tablet Take 2 tablets (50 mg total) by mouth at bedtime as needed for up to 7 days. 12/29/19 02/15/20  Darr, Edison Nasuti, PA-C  loratadine (CLARITIN) 10  MG tablet Take 1 tablet (10 mg total) by mouth daily. Patient not taking: Reported on 10/11/2019 09/18/19 12/02/19  Charlott Rakes, MD  Oxcarbazepine (TRILEPTAL) 300 MG tablet Take 1/2 tablet twice a day Patient not taking: Reported on 07/08/2019 01/26/18 07/09/19  Argentina Donovan, PA-C      Allergies    Coconut (cocos nucifera), Other, Codeine, Depakote [divalproex sodium], Hydroxyzine, Penicillins, Tape, Keflex [cephalexin], Latex, and Levetiracetam    Review of Systems   Review of Systems  Neurological:  Positive for seizures.  All other systems reviewed and are  negative.   Physical Exam Updated Vital Signs BP (!) 149/87 (BP Location: Right Arm)   Pulse (!) 53   Temp 97.9 F (36.6 C) (Oral)   Resp 16   SpO2 98%  Physical Exam Vitals and nursing note reviewed.  Constitutional:      General: He is not in acute distress.    Appearance: Normal appearance. He is well-developed.  HENT:     Head: Normocephalic and atraumatic.  Eyes:     Conjunctiva/sclera: Conjunctivae normal.     Pupils: Pupils are equal, round, and reactive to light.  Cardiovascular:     Rate and Rhythm: Normal rate and regular rhythm.     Heart sounds: Normal heart sounds.  Pulmonary:     Effort: Pulmonary effort is normal. No respiratory distress.     Breath sounds: Normal breath sounds.  Abdominal:     General: There is no distension.     Palpations: Abdomen is soft.     Tenderness: There is no abdominal tenderness.  Musculoskeletal:        General: No deformity. Normal range of motion.     Cervical back: Normal range of motion and neck supple.  Skin:    General: Skin is warm and dry.  Neurological:     General: No focal deficit present.     Mental Status: He is alert and oriented to person, place, and time.     ED Results / Procedures / Treatments   Labs (all labs ordered are listed, but only abnormal results are displayed) Labs Reviewed  BASIC METABOLIC PANEL - Abnormal; Notable for the following components:      Result Value   BUN 21 (*)    All other components within normal limits  CBC WITH DIFFERENTIAL/PLATELET  RAPID URINE DRUG SCREEN, HOSP PERFORMED    EKG EKG Interpretation  Date/Time:  Friday December 11 2021 18:21:58 EDT Ventricular Rate:  54 PR Interval:  152 QRS Duration: 122 QT Interval:  407 QTC Calculation: 386 R Axis:   -39 Text Interpretation: Sinus rhythm Nonspecific IVCD with LAD Left ventricular hypertrophy Confirmed by Dene Gentry [id number]) on 12/11/2021 6:28:40 PM  Radiology No results found.  Procedures Procedures     Medications Ordered in ED Medications - No data to display  ED Course/ Medical Decision Making/ A&P                           Medical Decision Making Amount and/or Complexity of Data Reviewed Labs: ordered.    Medical Screen Complete  This patient presented to the ED with complaint of possible seizure.  This complaint involves an extensive number of treatment options. The initial differential diagnosis includes, but is not limited to, metabolic abnormality, seizure, nonepileptic seizure  This presentation is: Acute, Chronic, Self-Limited, Previously Undiagnosed, Uncertain Prognosis, Complicated, Systemic Symptoms, and Threat to Life/Bodily Function  Patient is presenting after reported  seizure-like activity.  Patient with longstanding history of nonepileptic seizures.  History and exam today are consistent with same.  Screening labs obtained are without significant abnormality.    Patient is without complaint during evaluation.  After work-up he is comfortable with discharge.  He does understand need for close outpatient follow-up.  Strict return precautions given and understood.   Additional history obtained:  External records from outside sources obtained and reviewed including prior ED visits and prior Inpatient records.    Lab Tests:  I ordered and personally interpreted labs.  The pertinent results include: CBC, BMP  Cardiac Monitoring:  The patient was maintained on a cardiac monitor.  I personally viewed and interpreted the cardiac monitor which showed an underlying rhythm of: NSR  Problem List / ED Course:  Seizure-like episode, history of nonepileptic seizures   Reevaluation:  After the interventions noted above, I reevaluated the patient and found that they have: improved Disposition:  After consideration of the diagnostic results and the patients response to treatment, I feel that the patent would benefit from close outpatient follow-up.           Final Clinical Impression(s) / ED Diagnoses Final diagnoses:  Seizure-like activity [facility])    Rx / DC Orders ED Discharge Orders     None         Valarie Merino, MD 12/11/21 1926

## 2021-12-14 ENCOUNTER — Emergency Department (HOSPITAL_COMMUNITY)
Admission: EM | Admit: 2021-12-14 | Discharge: 2021-12-14 | Discharge status: Against Medical Advice | Payer: Medicare Other | Attending: Emergency Medicine | Admitting: Emergency Medicine

## 2021-12-14 ENCOUNTER — Encounter (HOSPITAL_COMMUNITY): Payer: Self-pay

## 2021-12-14 ENCOUNTER — Emergency Department (HOSPITAL_COMMUNITY): Payer: Medicare Other

## 2021-12-14 DIAGNOSIS — Z5321 Procedure and treatment not carried out due to patient leaving prior to being seen by health care provider: Secondary | ICD-10-CM | POA: Insufficient documentation

## 2021-12-14 DIAGNOSIS — R0602 Shortness of breath: Secondary | ICD-10-CM | POA: Diagnosis present

## 2021-12-14 DIAGNOSIS — R42 Dizziness and giddiness: Secondary | ICD-10-CM | POA: Diagnosis not present

## 2021-12-14 DIAGNOSIS — R410 Disorientation, unspecified: Secondary | ICD-10-CM | POA: Diagnosis not present

## 2021-12-14 DIAGNOSIS — R000 Tachycardia, unspecified: Secondary | ICD-10-CM | POA: Diagnosis not present

## 2021-12-14 LAB — CBC
HCT: 45.6 % (ref 39.0–52.0)
Hemoglobin: 15.3 g/dL (ref 13.0–17.0)
MCH: 29.8 pg (ref 26.0–34.0)
MCHC: 33.6 g/dL (ref 30.0–36.0)
MCV: 88.7 fL (ref 80.0–100.0)
Platelets: 202 10*3/uL (ref 150–400)
RBC: 5.14 MIL/uL (ref 4.22–5.81)
RDW: 12.1 % (ref 11.5–15.5)
WBC: 7 10*3/uL (ref 4.0–10.5)
nRBC: 0 % (ref 0.0–0.2)

## 2021-12-14 LAB — BASIC METABOLIC PANEL
Anion gap: 7 (ref 5–15)
BUN: 14 mg/dL (ref 6–20)
CO2: 26 mmol/L (ref 22–32)
Calcium: 9.8 mg/dL (ref 8.9–10.3)
Chloride: 106 mmol/L (ref 98–111)
Creatinine, Ser: 0.98 mg/dL (ref 0.61–1.24)
GFR, Estimated: >60 mL/min (ref >60)
Glucose, Bld: 80 mg/dL (ref 70–99)
Potassium: 3.9 mmol/L (ref 3.5–5.1)
Sodium: 139 mmol/L (ref 135–145)

## 2021-12-14 LAB — TROPONIN I (HIGH SENSITIVITY): Troponin I (High Sensitivity): 3 ng/L (ref <18)

## 2021-12-14 NOTE — ED Triage Notes (Addendum)
Pt arrived via [facility] from home.   Wife called out for SOB.   Pt states he had to walk a far distance and became shob.   When EMS arrived pt would not speak to EMS and was not cooperative.   Hx: schizophrenia, bipolar, and seizures   Wife states its not un common for pt to act this way but would like him to be checked out.   Since ems reports pt has been more cooperative.    Vs: Bp-120/88 P-72 RR-18 97% RA Cbg-110

## 2021-12-14 NOTE — ED Provider Triage Note (Signed)
Emergency Medicine Provider Triage Evaluation Note  Franklin [name] , a 41 y.o. male  was evaluated in triage.  Pt complains of shortness of breath and racing heart starting earlier this morning when he was trying to walk to the drugstore.  Patient states that when he got to the drugstore, he had to sit and rest.  When he left and started walking home his symptoms returned.  When EMS got there they noted him to be uncooperative, and patient does not have great memory of what was happening at that time.  He states he has had similar symptoms in the past related to his anxiety, but has not been on medication for "a very long time".  No history of cardiac problems  Review of Systems  Positive: Shortness of breath, heart racing, confusion, lightheadedness Negative: Chest pain, syncope, SI/HI, AVH  Physical Exam  BP 124/84 (BP Location: Left Arm)   Pulse 73   Temp 98.8 F (37.1 C) (Oral)   Resp 18   SpO2 98%  Gen:   Awake, no distress   Resp:  Normal effort  MSK:   Moves extremities without difficulty  Other:    Medical Decision Making  Medically screening exam initiated at 4:27 PM.  Appropriate orders placed.  Celso Sickle was informed that the remainder of the evaluation will be completed by another provider, this initial triage assessment does not replace that evaluation, and the importance of remaining in the ED until their evaluation is complete.     Maisee Vollman T, PA-C 12/14/21 1628

## 2022-01-23 ENCOUNTER — Encounter (HOSPITAL_COMMUNITY): Payer: Self-pay

## 2022-01-23 ENCOUNTER — Other Ambulatory Visit: Payer: Self-pay

## 2022-01-23 ENCOUNTER — Emergency Department (HOSPITAL_COMMUNITY)
Admission: EM | Admit: 2022-01-23 | Discharge: 2022-01-24 | Disposition: A | Discharge status: Home / Self Care | Payer: Medicare Other | Attending: Emergency Medicine | Admitting: Emergency Medicine

## 2022-01-23 ENCOUNTER — Emergency Department (HOSPITAL_COMMUNITY): Payer: Medicare Other

## 2022-01-23 DIAGNOSIS — Z91040 Latex allergy status: Secondary | ICD-10-CM | POA: Insufficient documentation

## 2022-01-23 DIAGNOSIS — E162 Hypoglycemia, unspecified: Secondary | ICD-10-CM | POA: Diagnosis present

## 2022-01-23 DIAGNOSIS — R569 Unspecified convulsions: Secondary | ICD-10-CM | POA: Diagnosis not present

## 2022-01-23 LAB — COMPREHENSIVE METABOLIC PANEL
ALT: 16 U/L (ref 0–44)
AST: 18 U/L (ref 15–41)
Albumin: 4 g/dL (ref 3.5–5.0)
Alkaline Phosphatase: 49 U/L (ref 38–126)
Anion gap: 8 (ref 5–15)
BUN: 14 mg/dL (ref 6–20)
CO2: 23 mmol/L (ref 22–32)
Calcium: 8.6 mg/dL — ABNORMAL LOW (ref 8.9–10.3)
Chloride: 108 mmol/L (ref 98–111)
Creatinine, Ser: 0.97 mg/dL (ref 0.61–1.24)
GFR, Estimated: >60 mL/min (ref >60)
Glucose, Bld: 111 mg/dL — ABNORMAL HIGH (ref 70–99)
Potassium: 3.6 mmol/L (ref 3.5–5.1)
Sodium: 139 mmol/L (ref 135–145)
Total Bilirubin: 0.8 mg/dL (ref 0.3–1.2)
Total Protein: 7.1 g/dL (ref 6.5–8.1)

## 2022-01-23 LAB — CBC WITH DIFFERENTIAL/PLATELET
Abs Immature Granulocytes: 0.01 10*3/uL (ref 0.00–0.07)
Basophils Absolute: 0 10*3/uL (ref 0.0–0.1)
Basophils Relative: 1 %
Eosinophils Absolute: 0.1 10*3/uL (ref 0.0–0.5)
Eosinophils Relative: 1 %
HCT: 40.2 % (ref 39.0–52.0)
Hemoglobin: 13.9 g/dL (ref 13.0–17.0)
Immature Granulocytes: 0 %
Lymphocytes Relative: 31 %
Lymphs Abs: 1.9 10*3/uL (ref 0.7–4.0)
MCH: 30.5 pg (ref 26.0–34.0)
MCHC: 34.6 g/dL (ref 30.0–36.0)
MCV: 88.2 fL (ref 80.0–100.0)
Monocytes Absolute: 0.5 10*3/uL (ref 0.1–1.0)
Monocytes Relative: 8 %
Neutro Abs: 3.7 10*3/uL (ref 1.7–7.7)
Neutrophils Relative %: 59 %
Platelets: 188 10*3/uL (ref 150–400)
RBC: 4.56 MIL/uL (ref 4.22–5.81)
RDW: 12.4 % (ref 11.5–15.5)
WBC: 6.3 10*3/uL (ref 4.0–10.5)
nRBC: 0 % (ref 0.0–0.2)

## 2022-01-23 LAB — CBG MONITORING, ED: Glucose-Capillary: 85 mg/dL (ref 70–99)

## 2022-01-23 NOTE — ED Triage Notes (Signed)
BIBA from home for "seizure" and unwitnessed fall, upon EMS arrival CBG was 47 given lemonade and peanut butter came up to 118, has hx seizure, unsure if hit head but c/o neck pain, no blood thinners, not a diabetic   18 LAC 138/84

## 2022-01-24 DIAGNOSIS — E162 Hypoglycemia, unspecified: Secondary | ICD-10-CM | POA: Diagnosis not present

## 2022-01-24 LAB — URINALYSIS, ROUTINE W REFLEX MICROSCOPIC
Bilirubin Urine: NEGATIVE
Glucose, UA: NEGATIVE mg/dL
Hgb urine dipstick: NEGATIVE
Ketones, ur: NEGATIVE mg/dL
Leukocytes,Ua: NEGATIVE
Nitrite: NEGATIVE
Protein, ur: NEGATIVE mg/dL
Specific Gravity, Urine: 1.015 (ref 1.005–1.030)
pH: 8 (ref 5.0–8.0)

## 2022-01-24 LAB — CBG MONITORING, ED: Glucose-Capillary: 100 mg/dL — ABNORMAL HIGH (ref 70–99)

## 2022-01-24 LAB — RAPID URINE DRUG SCREEN, HOSP PERFORMED
Amphetamines: NOT DETECTED
Barbiturates: NOT DETECTED
Benzodiazepines: NOT DETECTED
Cocaine: NOT DETECTED
Opiates: NOT DETECTED
Tetrahydrocannabinol: NOT DETECTED

## 2022-01-24 NOTE — ED Notes (Signed)
Advised RN of pt's pulse being 47, its been fluctuating

## 2022-01-24 NOTE — ED Provider Notes (Signed)
[facility] DEPT Provider Note   CSN: [id number] Arrival date & time: 01/23/22  2019     History  Chief Complaint  Patient presents with   Hypoglycemia    Franklin [name] is a 41 y.o. male.  The history is provided by the patient.  Hypoglycemia Initial blood sugar:  47 Blood sugar after intervention:  111 Severity:  Moderate Timing:  Constant Progression:  Resolved Chronicity:  New Diabetic status:  Non-diabetic Context: not recent illness   Relieved by:  Nothing Ineffective treatments:  None tried Associated symptoms: no obesity   Risk factors: no uncontrolled diabetes        Home Medications Prior to Admission medications   Medication Sig Start Date End Date Taking? Authorizing Provider  lamoTRIgine (LAMICTAL) 100 MG tablet Take 100 mg by mouth daily.   Yes [provider]  lamoTRIgine (LAMICTAL) 25 MG tablet Take 25 mg by mouth 2 (two) times daily. 03/20/20  Yes [provider]  albuterol (PROAIR HFA) 108 (90 Base) MCG/ACT inhaler Inhale 1-2 puffs into the lungs every 6 (six) hours as needed for wheezing or shortness of breath. Patient not taking: Reported on 01/23/2022 01/02/20   Augusto Gamble B, NP  chlorhexidine (HIBICLENS) 4 % external liquid Apply topically daily as needed. Patient not taking: Reported on 01/23/2022 01/28/21   Volney American, PA-C  doxycycline (VIBRAMYCIN) 100 MG capsule Take 1 capsule (100 mg total) by mouth 2 (two) times daily. Patient not taking: Reported on 01/23/2022 11/22/21   Hans Eden, NP  famotidine (PEPCID) 20 MG tablet Take 1 tablet (20 mg total) by mouth 2 (two) times daily for 7 days. Patient not taking: Reported on 01/23/2022 12/29/19 04/28/20  Darr, Edison Nasuti, PA-C  ibuprofen (ADVIL) 800 MG tablet Take 1 tablet (800 mg total) by mouth 3 (three) times daily. Patient not taking: Reported on 01/23/2022 11/22/21   Hans Eden, NP  LORazepam (ATIVAN) 1 MG tablet Take 1 tablet  (1 mg total) by mouth every 12 (twelve) hours as needed for anxiety. Patient not taking: Reported on 01/23/2022 05/03/21   Varney Biles, MD  Lumateperone Tosylate (CAPLYTA) 42 MG CAPS Take 42 mg by mouth daily. Patient not taking: Reported on 09/10/2020    [provider]  meclizine (ANTIVERT) 25 MG tablet Take 1 tablet (25 mg total) by mouth 3 (three) times daily as needed for dizziness. Patient not taking: Reported on 01/23/2022 08/08/20   Eustaquio Maize, PA-C  naproxen (NAPROSYN) 500 MG tablet Take 1 tablet (500 mg total) by mouth 2 (two) times daily with a meal. Patient not taking: Reported on 01/23/2022 10/25/21   Noemi Chapel, MD  ondansetron (ZOFRAN-ODT) 4 MG disintegrating tablet Take 1 tablet (4 mg total) by mouth every 8 (eight) hours as needed for nausea. Patient not taking: Reported on 01/23/2022 10/25/21   Noemi Chapel, MD  tiZANidine (ZANAFLEX) 4 MG tablet Take 1 tablet (4 mg total) by mouth every 8 (eight) hours as needed for muscle spasms. Patient not taking: Reported on 01/23/2022 01/05/21   Melynda Ripple, MD  diphenhydrAMINE (BENADRYL) 25 MG tablet Take 2 tablets (50 mg total) by mouth at bedtime as needed for up to 7 days. 12/29/19 02/15/20  Darr, Edison Nasuti, PA-C  loratadine (CLARITIN) 10 MG tablet Take 1 tablet (10 mg total) by mouth daily. Patient not taking: Reported on 10/11/2019 09/18/19 12/02/19  Charlott Rakes, MD  Oxcarbazepine (TRILEPTAL) 300 MG tablet Take 1/2 tablet twice a day Patient not taking: Reported on  07/08/2019 01/26/18 07/09/19  Argentina Donovan, PA-C      Allergies    Coconut (cocos nucifera), Other, Codeine, Depakote [divalproex sodium], Hydroxyzine, Penicillins, Tape, Keflex [cephalexin], Latex, and Levetiracetam    Review of Systems   Review of Systems  Constitutional:  Negative for fever.  HENT:  Negative for facial swelling.   Eyes:  Negative for redness.  Respiratory:  Negative for wheezing and stridor.   All other systems reviewed and are  negative.   Physical Exam Updated Vital Signs BP 128/89   Pulse (!) 47   Temp 98.9 F (37.2 C) (Oral)   Resp 17   Ht 6' (1.829 m)   Wt 70.3 kg   SpO2 100%   BMI 21.02 kg/m  Physical Exam Vitals and nursing note reviewed.  Constitutional:      General: He is not in acute distress.    Appearance: He is well-developed. He is not diaphoretic.  HENT:     Head: Normocephalic and atraumatic.  Eyes:     Conjunctiva/sclera: Conjunctivae normal.     Pupils: Pupils are equal, round, and reactive to light.  Cardiovascular:     Rate and Rhythm: Normal rate and regular rhythm.     Pulses: Normal pulses.     Heart sounds: Normal heart sounds.  Pulmonary:     Effort: Pulmonary effort is normal.     Breath sounds: Normal breath sounds. No wheezing or rales.  Abdominal:     General: Bowel sounds are normal.     Palpations: Abdomen is soft.     Tenderness: There is no abdominal tenderness. There is no guarding or rebound.  Musculoskeletal:        General: Normal range of motion.     Cervical back: Normal range of motion and neck supple.  Skin:    General: Skin is warm and dry.     Capillary Refill: Capillary refill takes less than 2 seconds.  Neurological:     General: No focal deficit present.     Mental Status: He is alert and oriented to person, place, and time.     Deep Tendon Reflexes: Reflexes normal.  Psychiatric:        Mood and Affect: Mood normal.        Behavior: Behavior normal.     ED Results / Procedures / Treatments   Labs (all labs ordered are listed, but only abnormal results are displayed) Results for orders placed or performed during the hospital encounter of 01/23/22  CBC with Differential  Result Value Ref Range   WBC 6.3 4.0 - 10.5 K/uL   RBC 4.56 4.22 - 5.81 MIL/uL   Hemoglobin 13.9 13.0 - 17.0 g/dL   HCT 40.2 39.0 - 52.0 %   MCV 88.2 80.0 - 100.0 fL   MCH 30.5 26.0 - 34.0 pg   MCHC 34.6 30.0 - 36.0 g/dL   RDW 12.4 11.5 - 15.5 %   Platelets 188  150 - 400 K/uL   nRBC 0.0 0.0 - 0.2 %   Neutrophils Relative % 59 %   Neutro Abs 3.7 1.7 - 7.7 K/uL   Lymphocytes Relative 31 %   Lymphs Abs 1.9 0.7 - 4.0 K/uL   Monocytes Relative 8 %   Monocytes Absolute 0.5 0.1 - 1.0 K/uL   Eosinophils Relative 1 %   Eosinophils Absolute 0.1 0.0 - 0.5 K/uL   Basophils Relative 1 %   Basophils Absolute 0.0 0.0 - 0.1 K/uL   Immature Granulocytes 0 %  Abs Immature Granulocytes 0.01 0.00 - 0.07 K/uL  Comprehensive metabolic panel  Result Value Ref Range   Sodium 139 135 - 145 mmol/L   Potassium 3.6 3.5 - 5.1 mmol/L   Chloride 108 98 - 111 mmol/L   CO2 23 22 - 32 mmol/L   Glucose, Bld 111 (H) 70 - 99 mg/dL   BUN 14 6 - 20 mg/dL   Creatinine, Ser 0.97 0.61 - 1.24 mg/dL   Calcium 8.6 (L) 8.9 - 10.3 mg/dL   Total Protein 7.1 6.5 - 8.1 g/dL   Albumin 4.0 3.5 - 5.0 g/dL   AST 18 15 - 41 U/L   ALT 16 0 - 44 U/L   Alkaline Phosphatase 49 38 - 126 U/L   Total Bilirubin 0.8 0.3 - 1.2 mg/dL   GFR, Estimated >60 >60 mL/min   Anion gap 8 5 - 15  Rapid urine drug screen (hospital performed)  Result Value Ref Range   Opiates NONE DETECTED NONE DETECTED   Cocaine NONE DETECTED NONE DETECTED   Benzodiazepines NONE DETECTED NONE DETECTED   Amphetamines NONE DETECTED NONE DETECTED   Tetrahydrocannabinol NONE DETECTED NONE DETECTED   Barbiturates NONE DETECTED NONE DETECTED  Urinalysis, Routine w reflex microscopic Urine, Clean Catch  Result Value Ref Range   Color, Urine YELLOW YELLOW   APPearance CLEAR CLEAR   Specific Gravity, Urine 1.015 1.005 - 1.030   pH 8.0 5.0 - 8.0   Glucose, UA NEGATIVE NEGATIVE mg/dL   Hgb urine dipstick NEGATIVE NEGATIVE   Bilirubin Urine NEGATIVE NEGATIVE   Ketones, ur NEGATIVE NEGATIVE mg/dL   Protein, ur NEGATIVE NEGATIVE mg/dL   Nitrite NEGATIVE NEGATIVE   Leukocytes,Ua NEGATIVE NEGATIVE  POC CBG, ED  Result Value Ref Range   Glucose-Capillary 85 70 - 99 mg/dL  POC CBG, ED  Result Value Ref Range    Glucose-Capillary 100 (H) 70 - 99 mg/dL   CT Head Wo Contrast  Result Date: 01/23/2022 CLINICAL DATA:  Seizure, fall, neck pain EXAM: CT HEAD WITHOUT CONTRAST CT CERVICAL SPINE WITHOUT CONTRAST TECHNIQUE: Multidetector CT imaging of the head and cervical spine was performed following the standard protocol without intravenous contrast. Multiplanar CT image reconstructions of the cervical spine were also generated. RADIATION DOSE REDUCTION: This exam was performed according to the departmental dose-optimization program which includes automated exposure control, adjustment of the mA and/or kV according to patient size and/or use of iterative reconstruction technique. COMPARISON:  CT head dated 10/04/2021. CT head/cervical spine dated 08/22/2020. FINDINGS: CT HEAD FINDINGS Brain: No evidence of acute infarction, hemorrhage, hydrocephalus, extra-axial collection or mass lesion/mass effect. Vascular: No hyperdense vessel or unexpected calcification. Skull: Normal. Negative for fracture or focal lesion. Sinuses/Orbits: The visualized paranasal sinuses are essentially clear. The mastoid air cells are unopacified. Other: None. CT CERVICAL SPINE FINDINGS Alignment: Normal cervical lordosis. Skull base and vertebrae: No acute fracture. No primary bone lesion or focal pathologic process. Soft tissues and spinal canal: No prevertebral fluid or swelling. No visible canal hematoma. Disc levels: Intervertebral disc spaces are maintained. Spinal canal is patent. Upper chest: Visualized lung apices are notable for mild biapical pleural-parenchymal scarring. Other: Visualized thyroid is unremarkable. IMPRESSION: Normal head CT. Normal cervical spine CT. Electronically Signed   By: Julian Hy M.D.   On: 01/23/2022 23:27   CT Cervical Spine Wo Contrast  Result Date: 01/23/2022 CLINICAL DATA:  Seizure, fall, neck pain EXAM: CT HEAD WITHOUT CONTRAST CT CERVICAL SPINE WITHOUT CONTRAST TECHNIQUE: Multidetector CT imaging of  the head  and cervical spine was performed following the standard protocol without intravenous contrast. Multiplanar CT image reconstructions of the cervical spine were also generated. RADIATION DOSE REDUCTION: This exam was performed according to the departmental dose-optimization program which includes automated exposure control, adjustment of the mA and/or kV according to patient size and/or use of iterative reconstruction technique. COMPARISON:  CT head dated 10/04/2021. CT head/cervical spine dated 08/22/2020. FINDINGS: CT HEAD FINDINGS Brain: No evidence of acute infarction, hemorrhage, hydrocephalus, extra-axial collection or mass lesion/mass effect. Vascular: No hyperdense vessel or unexpected calcification. Skull: Normal. Negative for fracture or focal lesion. Sinuses/Orbits: The visualized paranasal sinuses are essentially clear. The mastoid air cells are unopacified. Other: None. CT CERVICAL SPINE FINDINGS Alignment: Normal cervical lordosis. Skull base and vertebrae: No acute fracture. No primary bone lesion or focal pathologic process. Soft tissues and spinal canal: No prevertebral fluid or swelling. No visible canal hematoma. Disc levels: Intervertebral disc spaces are maintained. Spinal canal is patent. Upper chest: Visualized lung apices are notable for mild biapical pleural-parenchymal scarring. Other: Visualized thyroid is unremarkable. IMPRESSION: Normal head CT. Normal cervical spine CT. Electronically Signed   By: Julian Hy M.D.   On: 01/23/2022 23:27     EKG EKG Interpretation  Date/Time:  Saturday January 23 2022 22:50:47 EDT Ventricular Rate:  76 PR Interval:  177 QRS Duration: 116 QT Interval:  403 QTC Calculation: 454 R Axis:   -37 Text Interpretation: Sinus rhythm Confirmed by Randal Buba, Campbell Kray ([id number]) on 01/23/2022 11:54:38 PM  Radiology CT Head Wo Contrast  Result Date: 01/23/2022 CLINICAL DATA:  Seizure, fall, neck pain EXAM: CT HEAD WITHOUT CONTRAST CT CERVICAL  SPINE WITHOUT CONTRAST TECHNIQUE: Multidetector CT imaging of the head and cervical spine was performed following the standard protocol without intravenous contrast. Multiplanar CT image reconstructions of the cervical spine were also generated. RADIATION DOSE REDUCTION: This exam was performed according to the departmental dose-optimization program which includes automated exposure control, adjustment of the mA and/or kV according to patient size and/or use of iterative reconstruction technique. COMPARISON:  CT head dated 10/04/2021. CT head/cervical spine dated 08/22/2020. FINDINGS: CT HEAD FINDINGS Brain: No evidence of acute infarction, hemorrhage, hydrocephalus, extra-axial collection or mass lesion/mass effect. Vascular: No hyperdense vessel or unexpected calcification. Skull: Normal. Negative for fracture or focal lesion. Sinuses/Orbits: The visualized paranasal sinuses are essentially clear. The mastoid air cells are unopacified. Other: None. CT CERVICAL SPINE FINDINGS Alignment: Normal cervical lordosis. Skull base and vertebrae: No acute fracture. No primary bone lesion or focal pathologic process. Soft tissues and spinal canal: No prevertebral fluid or swelling. No visible canal hematoma. Disc levels: Intervertebral disc spaces are maintained. Spinal canal is patent. Upper chest: Visualized lung apices are notable for mild biapical pleural-parenchymal scarring. Other: Visualized thyroid is unremarkable. IMPRESSION: Normal head CT. Normal cervical spine CT. Electronically Signed   By: Julian Hy M.D.   On: 01/23/2022 23:27   CT Cervical Spine Wo Contrast  Result Date: 01/23/2022 CLINICAL DATA:  Seizure, fall, neck pain EXAM: CT HEAD WITHOUT CONTRAST CT CERVICAL SPINE WITHOUT CONTRAST TECHNIQUE: Multidetector CT imaging of the head and cervical spine was performed following the standard protocol without intravenous contrast. Multiplanar CT image reconstructions of the cervical spine were also  generated. RADIATION DOSE REDUCTION: This exam was performed according to the departmental dose-optimization program which includes automated exposure control, adjustment of the mA and/or kV according to patient size and/or use of iterative reconstruction technique. COMPARISON:  CT head dated 10/04/2021. CT head/cervical spine dated 08/22/2020. FINDINGS:  CT HEAD FINDINGS Brain: No evidence of acute infarction, hemorrhage, hydrocephalus, extra-axial collection or mass lesion/mass effect. Vascular: No hyperdense vessel or unexpected calcification. Skull: Normal. Negative for fracture or focal lesion. Sinuses/Orbits: The visualized paranasal sinuses are essentially clear. The mastoid air cells are unopacified. Other: None. CT CERVICAL SPINE FINDINGS Alignment: Normal cervical lordosis. Skull base and vertebrae: No acute fracture. No primary bone lesion or focal pathologic process. Soft tissues and spinal canal: No prevertebral fluid or swelling. No visible canal hematoma. Disc levels: Intervertebral disc spaces are maintained. Spinal canal is patent. Upper chest: Visualized lung apices are notable for mild biapical pleural-parenchymal scarring. Other: Visualized thyroid is unremarkable. IMPRESSION: Normal head CT. Normal cervical spine CT. Electronically Signed   By: Julian Hy M.D.   On: 01/23/2022 23:27    Procedures Procedures    Medications Ordered in ED Medications - No data to display  ED Course/ Medical Decision Making/ A&P                           Medical Decision Making Patient had seizure like activity with low blood sugar   Amount and/or Complexity of Data Reviewed Independent Historian: EMS    Details: See above  External Data Reviewed: notes.    Details: Previous notes reviewed  Labs: ordered.    Details: All labs reviewed:  UDS is negative, urine is without UTI.  Normal sodium 139 normal potassium and normal creatinine .97.  Normal white count 6.3, normal hemoglobin 13.9 and  normal platelet count.   Radiology: ordered.    Details: CT head and C spine are negative by my reading  ECG/medicine tests: ordered and independent interpretation performed. Decision-making details documented in ED Course.  Risk Risk Details: Well appearing, PO challenged and ambulated about the department.  Stable for discharge.  Strict return precautions    Final Clinical Impression(s) / ED Diagnoses Final diagnoses:  None   Return for intractable cough, coughing up blood, fevers > 100.4 unrelieved by medication, shortness of breath, intractable vomiting, chest pain, shortness of breath, weakness, numbness, changes in speech, facial asymmetry, abdominal pain, passing out, Inability to tolerate liquids or food, cough, altered mental status or any concerns. No signs of systemic illness or infection. The patient is nontoxic-appearing on exam and vital signs are within normal limits.  I have reviewed the triage vital signs and the nursing notes. Pertinent labs & imaging results that were available during my care of the patient were reviewed by me and considered in my medical decision making (see chart for details). After history, exam, and medical workup I feel the patient has been appropriately medically screened and is safe for discharge home. Pertinent diagnoses were discussed with the patient. Patient was given return precautions.  Rx / DC Orders ED Discharge Orders     None         Niguel Moure, MD 01/24/22 [id number]

## 2022-01-25 LAB — LAMOTRIGINE LEVEL: Lamotrigine Lvl: <1 ug/mL — ABNORMAL LOW (ref 2.0–20.0)

## 2022-01-30 ENCOUNTER — Encounter: Payer: Self-pay | Admitting: Emergency Medicine

## 2022-01-30 ENCOUNTER — Ambulatory Visit
Admission: EM | Admit: 2022-01-30 | Discharge: 2022-01-30 | Disposition: A | Discharge status: Home / Self Care | Payer: Medicare Other | Attending: Physician Assistant | Admitting: Physician Assistant

## 2022-01-30 DIAGNOSIS — L02411 Cutaneous abscess of right axilla: Secondary | ICD-10-CM

## 2022-01-30 MED ORDER — DOXYCYCLINE HYCLATE 100 MG PO CAPS: 100.0000 mg | ORAL_CAPSULE | Freq: Two times a day (BID) | ORAL | 0 refills | Status: DC

## 2022-01-30 NOTE — ED Provider Notes (Signed)
[facility]    CSN: [id number] Arrival date & time: 01/30/22  1049      History   Chief Complaint Chief Complaint  Patient presents with   Abscess    HPI Franklin [name] is a 41 y.o. male.   Patient is here today for evaluation of abscess to right axilla he first noted yesterday or day before. He has history or same. Pain is present and worse with lifting right arm. He denies fever.   The history is provided by the patient.  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[place])    "raises the possibility of non-epileptic events" per Neuro MD office note 03/2015   Depression    Dizziness    Hx of electroencephalogram 12/2014   normal   Insomnia    Mild cognitive impairment    Psychogenic nonepileptic seizure    Schizophrenia ([place])    Schizophrenic disorder ([place])    Seizure-like activity ([place])    Seizures ([place])     Patient Active Problem List   Diagnosis Date Noted   Migraine without aura and without status migrainosus, not intractable 05/22/2018   Vertigo 05/22/2018   Psychogenic nonepileptic seizure 02/18/2017   Cholelithiasis 01/24/2017   Conversion disorder with seizures or convulsions 03/26/2016   Spells of decreased attentiveness 03/16/2016   Abnormal EKG 11/30/2015   Elevated troponin 11/30/2015   Bipolar affective disorder, most recent episode unspecified type, remission status unspecified 03/11/2015   Bipolar affective disorder ([place]) 01/08/2015   Generalized idiopathic epilepsy and epileptic syndromes, without status epilepticus, 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 No pertinent surgical history.     Home Medications    Prior to Admission medications   Medication Sig Start Date End Date Taking? Authorizing  Provider  doxycycline (VIBRAMYCIN) 100 MG capsule Take 1 capsule (100 mg total) by mouth 2 (two) times daily. 01/30/22  Yes Francene Finders, PA-C  albuterol [facility] HFA) 108 (90 Base) MCG/ACT inhaler Inhale 1-2 puffs into the lungs every 6 (six) hours as needed for wheezing or shortness of breath. Patient not taking: Reported on 01/23/2022 01/02/20   Augusto Gamble B, NP  chlorhexidine (HIBICLENS) 4 % external liquid Apply topically daily as needed. Patient not taking: Reported on 01/23/2022 01/28/21   Volney American, PA-C  famotidine (PEPCID) 20 MG tablet Take 1 tablet (20 mg total) by mouth 2 (two) times daily for 7 days. Patient not taking: Reported on 01/23/2022 12/29/19 04/28/20  Darr, Edison Nasuti, PA-C  ibuprofen (ADVIL) 800 MG tablet Take 1 tablet (800 mg total) by mouth 3 (three) times daily. Patient not taking: Reported on 01/23/2022 11/22/21   Hans Eden, NP  lamoTRIgine (LAMICTAL) 100 MG tablet Take 100 mg by mouth daily.    [provider]  lamoTRIgine (LAMICTAL) 25 MG tablet Take 25 mg by mouth 2 (two) times daily. 03/20/20   [provider]  LORazepam (ATIVAN) 1 MG tablet Take 1 tablet (1 mg total) by mouth every 12 (twelve) hours as needed for anxiety. Patient not taking: Reported on 01/23/2022 05/03/21   Varney Biles, MD  Lumateperone Tosylate (CAPLYTA) 42 MG CAPS Take 42 mg by mouth daily. Patient not taking: Reported on 09/10/2020    [provider]  meclizine (ANTIVERT) 25 MG tablet Take 1 tablet (25 mg total) by mouth 3 (three) times daily as needed for dizziness. Patient not taking: Reported on 01/23/2022 08/08/20   Eustaquio Maize, PA-C  naproxen (NAPROSYN) 500 MG tablet Take 1 tablet (500 mg total) by mouth 2 (two) times daily with a meal. Patient not taking: Reported on 01/23/2022 10/25/21   Noemi Chapel, MD  ondansetron (ZOFRAN-ODT) 4 MG disintegrating tablet Take 1 tablet (4 mg total) by mouth every 8 (eight) hours as needed for nausea. Patient  not taking: Reported on 01/23/2022 10/25/21   Noemi Chapel, MD  tiZANidine (ZANAFLEX) 4 MG tablet Take 1 tablet (4 mg total) by mouth every 8 (eight) hours as needed for muscle spasms. Patient not taking: Reported on 01/23/2022 01/05/21   Melynda Ripple, MD  diphenhydrAMINE (BENADRYL) 25 MG tablet Take 2 tablets (50 mg total) by mouth at bedtime as needed for up to 7 days. 12/29/19 02/15/20  Darr, Edison Nasuti, PA-C  loratadine (CLARITIN) 10 MG tablet Take 1 tablet (10 mg total) by mouth daily. Patient not taking: Reported on 10/11/2019 09/18/19 12/02/19  Charlott Rakes, MD  Oxcarbazepine (TRILEPTAL) 300 MG tablet Take 1/2 tablet twice a day Patient not taking: Reported on 07/08/2019 01/26/18 07/09/19  Argentina Donovan, PA-C    Family History Family History  Problem Relation Age of Onset   Kidney failure Mother    Heart disease Mother     Social History Social History   Tobacco Use   Smoking status: Never   Smokeless tobacco: Never  Vaping Use   Vaping Use: Never used  Substance Use Topics   Alcohol use: Not Currently    Comment: special occassions   Drug use: Never     Allergies   Coconut (cocos nucifera), Other, Codeine, Depakote [divalproex sodium], Hydroxyzine, Penicillins, Tape, Keflex [cephalexin], Latex, and Levetiracetam   Review of Systems Review of Systems  Constitutional:  Negative for chills and fever.  Eyes:  Negative for discharge and redness.  Skin:  Negative for color change and wound.  Neurological:  Negative for numbness.     Physical Exam Triage Vital Signs ED Triage Vitals  Enc Vitals Group     BP 01/30/22 1128 (!) 157/73     Pulse Rate 01/30/22 1128 61     Resp 01/30/22 1128 16     Temp 01/30/22 1128 98.6 F (37 C)     Temp Source 01/30/22 1128 Oral     SpO2 01/30/22 1128 98 %     Weight --      Height --      Head Circumference --      Peak Flow --      Pain Score 01/30/22 1127 5     Pain Loc --      Pain Edu? --      Excl. in [place]? --    No data  found.  Updated Vital Signs BP (!) 157/73 (BP Location: Right Arm)   Pulse 61   Temp 98.6 F (37 C) (Oral)   Resp 16   SpO2 98%      Physical Exam Vitals and nursing note reviewed.  Constitutional:      General: He is not in acute distress.    Appearance: Normal appearance. He is not ill-appearing.  HENT:     Head: Normocephalic and atraumatic.  Eyes:     Conjunctiva/sclera: Conjunctivae normal.  Cardiovascular:     Rate and Rhythm: Normal rate.  Pulmonary:  Effort: Pulmonary effort is normal.  Skin:    Comments: Exam limited due to patient inability to remain still- unable to fully palpate suspected abscess but small area of swelling noted to axilla with no apparent drainage  Neurological:     Mental Status: He is alert.  Psychiatric:        Mood and Affect: Mood normal.        Behavior: Behavior normal.        Thought Content: Thought content normal.      UC Treatments / Results  Labs (all labs ordered are listed, but only abnormal results are displayed) Labs Reviewed - No data to display  EKG   Radiology No results found.  Procedures Procedures (including critical care time)  Medications Ordered in UC Medications - No data to display  Initial Impression / Assessment and Plan / UC Course  I have reviewed the triage vital signs and the nursing notes.  Pertinent labs & imaging results that were available during my care of the patient were reviewed by me and considered in my medical decision making (see chart for details).    Antibiotic prescribed to cover possible abscess. Encouraged follow up in the ED if no improvement as I am not confident patient would cooperate with incision and drainage in urgent care setting.   Final Clinical Impressions(s) / UC Diagnoses   Final diagnoses:  Abscess of right axilla     Discharge Instructions       Please report to ED for incision and drainage of abscess if no improvement with antibiotic therapy.         ED Prescriptions     Medication Sig Dispense Auth. Provider   doxycycline (VIBRAMYCIN) 100 MG capsule Take 1 capsule (100 mg total) by mouth 2 (two) times daily. 20 capsule Francene Finders, PA-C      PDMP not reviewed this encounter.   Francene Finders, PA-C 01/30/22 1458

## 2022-01-30 NOTE — Discharge Instructions (Signed)
  Please report to ED for incision and drainage of abscess if no improvement with antibiotic therapy.

## 2022-01-30 NOTE — ED Triage Notes (Signed)
Pt has an abscess under his right arm pit that began yesterday and has gotten tender and hard to lift his arm. Pt said this has happened before

## 2022-02-05 ENCOUNTER — Emergency Department (HOSPITAL_COMMUNITY)
Admission: EM | Admit: 2022-02-05 | Discharge: 2022-02-05 | Disposition: A | Discharge status: Home / Self Care | Payer: Medicare Other | Attending: Emergency Medicine | Admitting: Emergency Medicine

## 2022-02-05 ENCOUNTER — Other Ambulatory Visit: Payer: Self-pay

## 2022-02-05 ENCOUNTER — Encounter (HOSPITAL_COMMUNITY): Payer: Self-pay

## 2022-02-05 DIAGNOSIS — R400 Somnolence: Secondary | ICD-10-CM | POA: Insufficient documentation

## 2022-02-05 DIAGNOSIS — T887XXA Unspecified adverse effect of drug or medicament, initial encounter: Secondary | ICD-10-CM | POA: Insufficient documentation

## 2022-02-05 DIAGNOSIS — Z91040 Latex allergy status: Secondary | ICD-10-CM | POA: Insufficient documentation

## 2022-02-05 DIAGNOSIS — T43505A Adverse effect of unspecified antipsychotics and neuroleptics, initial encounter: Secondary | ICD-10-CM | POA: Diagnosis not present

## 2022-02-05 LAB — CBG MONITORING, ED: Glucose-Capillary: 140 mg/dL — ABNORMAL HIGH (ref 70–99)

## 2022-02-05 NOTE — Discharge Instructions (Signed)
Drowsiness is a known side effect of your medications.  Please get in touch with your primary care later today to discuss eventually decreasing your dose as needed.  Return with any worsening symptoms.  We hope that you get some rest and feel better!

## 2022-02-05 NOTE — ED Provider Notes (Signed)
[facility] DEPT Provider Note   CSN: [id number] Arrival date & time: 02/05/22  0119     History  Chief Complaint  Patient presents with   Sleepy        Franklin [name] is a 41 y.o. male with a past medical history of seizures and bipolar disorder presenting today at the request of his wife due to drowsiness.  She believes that he took something other than his olanzapine.  Patient is arousable.  He is able to provide his own history.  HPI     Home Medications Prior to Admission medications   Medication Sig Start Date End Date Taking? Authorizing Provider  albuterol (PROAIR HFA) 108 (90 Base) MCG/ACT inhaler Inhale 1-2 puffs into the lungs every 6 (six) hours as needed for wheezing or shortness of breath. Patient not taking: Reported on 01/23/2022 01/02/20   Augusto Gamble B, NP  chlorhexidine (HIBICLENS) 4 % external liquid Apply topically daily as needed. Patient not taking: Reported on 01/23/2022 01/28/21   Volney American, PA-C  doxycycline (VIBRAMYCIN) 100 MG capsule Take 1 capsule (100 mg total) by mouth 2 (two) times daily. 01/30/22   Francene Finders, PA-C  famotidine (PEPCID) 20 MG tablet Take 1 tablet (20 mg total) by mouth 2 (two) times daily for 7 days. Patient not taking: Reported on 01/23/2022 12/29/19 04/28/20  Darr, Edison Nasuti, PA-C  ibuprofen (ADVIL) 800 MG tablet Take 1 tablet (800 mg total) by mouth 3 (three) times daily. Patient not taking: Reported on 01/23/2022 11/22/21   Hans Eden, NP  lamoTRIgine (LAMICTAL) 100 MG tablet Take 100 mg by mouth daily.    [provider]  lamoTRIgine (LAMICTAL) 25 MG tablet Take 25 mg by mouth 2 (two) times daily. 03/20/20   [provider]  LORazepam (ATIVAN) 1 MG tablet Take 1 tablet (1 mg total) by mouth every 12 (twelve) hours as needed for anxiety. Patient not taking: Reported on 01/23/2022 05/03/21   Varney Biles, MD  Lumateperone Tosylate (CAPLYTA) 42 MG CAPS Take 42 mg by  mouth daily. Patient not taking: Reported on 09/10/2020    [provider]  meclizine (ANTIVERT) 25 MG tablet Take 1 tablet (25 mg total) by mouth 3 (three) times daily as needed for dizziness. Patient not taking: Reported on 01/23/2022 08/08/20   Eustaquio Maize, PA-C  naproxen (NAPROSYN) 500 MG tablet Take 1 tablet (500 mg total) by mouth 2 (two) times daily with a meal. Patient not taking: Reported on 01/23/2022 10/25/21   Noemi Chapel, MD  ondansetron (ZOFRAN-ODT) 4 MG disintegrating tablet Take 1 tablet (4 mg total) by mouth every 8 (eight) hours as needed for nausea. Patient not taking: Reported on 01/23/2022 10/25/21   Noemi Chapel, MD  tiZANidine (ZANAFLEX) 4 MG tablet Take 1 tablet (4 mg total) by mouth every 8 (eight) hours as needed for muscle spasms. Patient not taking: Reported on 01/23/2022 01/05/21   Melynda Ripple, MD  diphenhydrAMINE (BENADRYL) 25 MG tablet Take 2 tablets (50 mg total) by mouth at bedtime as needed for up to 7 days. 12/29/19 02/15/20  Darr, Edison Nasuti, PA-C  loratadine (CLARITIN) 10 MG tablet Take 1 tablet (10 mg total) by mouth daily. Patient not taking: Reported on 10/11/2019 09/18/19 12/02/19  Charlott Rakes, MD  Oxcarbazepine (TRILEPTAL) 300 MG tablet Take 1/2 tablet twice a day Patient not taking: Reported on 07/08/2019 01/26/18 07/09/19  Argentina Donovan, PA-C      Allergies    Coconut (cocos nucifera), Other,  Codeine, Depakote [divalproex sodium], Hydroxyzine, Penicillins, Tape, Keflex [cephalexin], Latex, and Levetiracetam    Review of Systems   Review of Systems  Physical Exam Updated Vital Signs BP 127/81 (BP Location: Left Arm)   Pulse 65   Temp 98.6 F (37 C) (Oral)   Resp 18   SpO2 95%  Physical Exam Vitals and nursing note reviewed.  Constitutional:      Appearance: Normal appearance.  HENT:     Head: Normocephalic and atraumatic.  Eyes:     General: No scleral icterus.    Conjunctiva/sclera: Conjunctivae normal.  Cardiovascular:     Rate  and Rhythm: Normal rate and regular rhythm.  Pulmonary:     Effort: Pulmonary effort is normal. No respiratory distress.  Skin:    Findings: No rash.  Neurological:     Mental Status: He is alert.  Psychiatric:        Mood and Affect: Mood normal.     Comments: Drowsy, but arousable     ED Results / Procedures / Treatments   Labs (all labs ordered are listed, but only abnormal results are displayed) Labs Reviewed  CBG MONITORING, ED - Abnormal; Notable for the following components:      Result Value   Glucose-Capillary 140 (*)    All other components within normal limits    EKG None  Radiology No results found.  Procedures Procedures   Medications Ordered in ED Medications - No data to display  ED Course/ Medical Decision Making/ A&P                           Medical Decision Making  41 year old male presenting today due to drowsiness.  Wife reportedly had him come be evaluated because she believed he took something other than his olanzapine.  Patient is able to provide his own history.  Physical exam is within normal limits.  I do not believe he needs lab work and is likely just experiencing a very common and known side effect of the olanzapine.  I believe he is stable for discharge at this time.  Resting comfortably, vital signs stable.  When he is more awake tomorrow he is agreeable to following up with his outpatient physicians.  Additionally, patient has decision-making capacity and I do not believe it is appropriate to drug and alcohol screen him solely due to a spousal concern.  Spouse is not present in the emergency department and patient is agreeable to be discharged at this time.     Final Clinical Impression(s) / ED Diagnoses Final diagnoses:  Drowsiness  Medication side effect    Rx / DC Orders ED Discharge Orders     None      Results and diagnoses were explained to the patient. Return precautions discussed in full. Patient had no additional  questions and expressed complete understanding.   This chart was dictated using voice recognition software.  Despite best efforts to proofread,  errors can occur which can change the documentation meaning.    Rhae Hammock, PA-C 02/05/22 0327    Veryl Speak, MD 02/05/22 [phone]

## 2022-02-05 NOTE — ED Triage Notes (Addendum)
Patient BIB [facility] from home. He took his olanzapine, his wife said he was not acting right and she thinks he took something else. He denies. Patient having no complaints other than feeling sleepy at this time. He said he used to take olanzapine for a while, stopped taking it, then tonight took it again.

## 2022-02-15 ENCOUNTER — Ambulatory Visit (HOSPITAL_COMMUNITY)
Admission: EM | Admit: 2022-02-15 | Discharge: 2022-02-15 | Disposition: A | Discharge status: Home / Self Care | Payer: Medicare Other | Attending: Family Medicine | Admitting: Family Medicine

## 2022-02-15 ENCOUNTER — Encounter (HOSPITAL_COMMUNITY): Payer: Self-pay | Admitting: Emergency Medicine

## 2022-02-15 ENCOUNTER — Ambulatory Visit (INDEPENDENT_AMBULATORY_CARE_PROVIDER_SITE_OTHER): Payer: Medicare Other

## 2022-02-15 ENCOUNTER — Other Ambulatory Visit: Payer: Self-pay

## 2022-02-15 DIAGNOSIS — M109 Gout, unspecified: Secondary | ICD-10-CM | POA: Diagnosis not present

## 2022-02-15 DIAGNOSIS — M79675 Pain in left toe(s): Secondary | ICD-10-CM

## 2022-02-15 MED ORDER — COLCHICINE 0.6 MG PO TABS: ORAL_TABLET | ORAL | 0 refills | Status: DC

## 2022-02-15 MED ORDER — INDOMETHACIN 50 MG PO CAPS: 50.0000 mg | ORAL_CAPSULE | Freq: Three times a day (TID) | ORAL | 0 refills | Status: DC

## 2022-02-15 NOTE — Discharge Instructions (Addendum)
You were seen today for pain of the left toe.  I think this is most likely gout of the toe.  I have sent out several medications to help with your pain. This should resolve soon.  If not, then please follow up with your doctor, or return for further evaluation

## 2022-02-15 NOTE — ED Provider Notes (Signed)
[facility]    CSN: [id number] Arrival date & time: 02/15/22  6213      History   Chief Complaint Chief Complaint  Patient presents with   Toe Pain    HPI Franklin [name] is a 41 y.o. male.   Patient is here for left 5th toe pain.  He was getting out of the shower today, and stepped out, and had "serious" pain int the toe.  No injury that he is aware.  Does not appear red or swollen.   It is not painful just sitting, but painful with walking.         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[place])    "raises the possibility of non-epileptic events" per Neuro MD office note 03/2015   Depression    Dizziness    Hx of electroencephalogram 12/2014   normal   Insomnia    Mild cognitive impairment    Psychogenic nonepileptic seizure    Schizophrenia ([place])    Schizophrenic disorder ([place])    Seizure-like activity ([place])    Seizures ([place])     Patient Active Problem List   Diagnosis Date Noted   Migraine without aura and without status migrainosus, not intractable 05/22/2018   Vertigo 05/22/2018   Psychogenic nonepileptic seizure 02/18/2017   Cholelithiasis 01/24/2017   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 No pertinent surgical history.     Home Medications    Prior to Admission medications   Medication Sig Start Date End Date Taking? Authorizing Provider   albuterol (PROAIR HFA) 108 (90 Base) MCG/ACT inhaler Inhale 1-2 puffs into the lungs every 6 (six) hours as needed for wheezing or shortness of breath. Patient not taking: Reported on 01/23/2022 01/02/20   Augusto Gamble B, NP  chlorhexidine (HIBICLENS) 4 % external liquid Apply topically daily as needed. Patient not taking: Reported on 01/23/2022 01/28/21   Volney American, PA-C  doxycycline (VIBRAMYCIN) 100 MG capsule Take 1 capsule (100 mg total) by mouth 2 (two) times daily. 01/30/22   Francene Finders, PA-C  famotidine (PEPCID) 20 MG tablet Take 1 tablet (20 mg total) by mouth 2 (two) times daily for 7 days. Patient not taking: Reported on 01/23/2022 12/29/19 04/28/20  Darr, Edison Nasuti, PA-C  ibuprofen (ADVIL) 800 MG tablet Take 1 tablet (800 mg total) by mouth 3 (three) times daily. Patient not taking: Reported on 01/23/2022 11/22/21   Hans Eden, NP  lamoTRIgine (LAMICTAL) 100 MG tablet Take 100 mg by mouth daily.    [provider]  lamoTRIgine (LAMICTAL) 25 MG tablet Take 25 mg by mouth 2 (two) times daily. 03/20/20   [provider]  LORazepam (ATIVAN) 1 MG tablet Take 1 tablet (1 mg total) by mouth every 12 (twelve) hours as needed for anxiety. Patient not taking: Reported on 01/23/2022 05/03/21   Varney Biles, MD  Lumateperone Tosylate (CAPLYTA) 42 MG CAPS Take 42 mg by mouth daily. Patient not taking: Reported  on 09/10/2020    [provider]  meclizine (ANTIVERT) 25 MG tablet Take 1 tablet (25 mg total) by mouth 3 (three) times daily as needed for dizziness. Patient not taking: Reported on 01/23/2022 08/08/20   Eustaquio Maize, PA-C  naproxen (NAPROSYN) 500 MG tablet Take 1 tablet (500 mg total) by mouth 2 (two) times daily with a meal. Patient not taking: Reported on 01/23/2022 10/25/21   Noemi Chapel, MD  ondansetron (ZOFRAN-ODT) 4 MG disintegrating tablet Take 1 tablet (4 mg total) by mouth every 8 (eight) hours as needed for nausea. Patient not taking:  Reported on 01/23/2022 10/25/21   Noemi Chapel, MD  tiZANidine (ZANAFLEX) 4 MG tablet Take 1 tablet (4 mg total) by mouth every 8 (eight) hours as needed for muscle spasms. Patient not taking: Reported on 01/23/2022 01/05/21   Melynda Ripple, MD  diphenhydrAMINE (BENADRYL) 25 MG tablet Take 2 tablets (50 mg total) by mouth at bedtime as needed for up to 7 days. 12/29/19 02/15/20  Darr, Edison Nasuti, PA-C  loratadine (CLARITIN) 10 MG tablet Take 1 tablet (10 mg total) by mouth daily. Patient not taking: Reported on 10/11/2019 09/18/19 12/02/19  Charlott Rakes, MD  Oxcarbazepine (TRILEPTAL) 300 MG tablet Take 1/2 tablet twice a day Patient not taking: Reported on 07/08/2019 01/26/18 07/09/19  Argentina Donovan, PA-C    Family History Family History  Problem Relation Age of Onset   Kidney failure Mother    Heart disease Mother     Social History Social History   Tobacco Use   Smoking status: Never   Smokeless tobacco: Never  Vaping Use   Vaping Use: Never used  Substance Use Topics   Alcohol use: Not Currently    Comment: special occassions   Drug use: Never     Allergies   Coconut (cocos nucifera), Other, Codeine, Depakote [divalproex sodium], Hydroxyzine, Penicillins, Tape, Keflex [cephalexin], Latex, and Levetiracetam   Review of Systems Review of Systems  Constitutional: Negative.   HENT: Negative.    Respiratory: Negative.    Cardiovascular: Negative.   Gastrointestinal: Negative.   Genitourinary: Negative.      Physical Exam Triage Vital Signs ED Triage Vitals  Enc Vitals Group     BP 02/15/22 0817 (!) 140/89     Pulse Rate 02/15/22 0817 62     Resp 02/15/22 0817 18     Temp 02/15/22 0817 98.2 F (36.8 C)     Temp Source 02/15/22 0817 Oral     SpO2 02/15/22 0817 98 %     Weight --      Height --      Head Circumference --      Peak Flow --      Pain Score 02/15/22 0816 6     Pain Loc --      Pain Edu? --      Excl. in [place]? --    No data found.  Updated Vital  Signs BP (!) 140/89 (BP Location: Left Arm)   Pulse 62   Temp 98.2 F (36.8 C) (Oral)   Resp 18   SpO2 98%   Visual Acuity Right Eye Distance:   Left Eye Distance:   Bilateral Distance:    Right Eye Near:   Left Eye Near:    Bilateral Near:     Physical Exam Constitutional:      Appearance: Normal appearance.  Cardiovascular:     Rate and Rhythm: Normal rate.  Pulmonary:     Effort: Pulmonary effort is  normal.  Feet:     Comments: The left 5th toe is red and swollen;  very TTP at the toe and into the foot;   Neurological:     General: No focal deficit present.     Mental Status: He is alert.  Psychiatric:        Mood and Affect: Mood normal.      UC Treatments / Results  Labs (all labs ordered are listed, but only abnormal results are displayed) Labs Reviewed - No data to display  EKG   Radiology DG Toe 5th Left  Result Date: 02/15/2022 CLINICAL DATA:  Fifth toe pain and swelling. EXAM: DG TOE 5TH LEFT COMPARISON:  None Available. FINDINGS: No acute fracture or dislocation. No aggressive osseous lesion. Normal alignment. Soft tissue are unremarkable. No radiopaque foreign body or soft tissue emphysema. IMPRESSION: 1. No acute osseous injury of the left fifth toe. Electronically Signed   By: Kathreen Devoid M.D.   On: 02/15/2022 08:45    Procedures Procedures (including critical care time)  Medications Ordered in UC Medications - No data to display  Initial Impression / Assessment and Plan / UC Course  I have reviewed the triage vital signs and the nursing notes.  Pertinent labs & imaging results that were available during my care of the patient were reviewed by me and considered in my medical decision making (see chart for details).     Final Clinical Impressions(s) / UC Diagnoses   Final diagnoses:  Toe pain, left  Acute gout involving toe of left foot, unspecified cause     Discharge Instructions      You were seen today for pain of the left toe.   I think this is most likely gout of the toe.  I have sent out several medications to help with your pain. This should resolve soon.  If not, then please follow up with your doctor, or return for further evaluation     ED Prescriptions     Medication Sig Dispense Auth. Provider   indomethacin (INDOCIN) 50 MG capsule Take 1 capsule (50 mg total) by mouth 3 (three) times daily with meals. 21 capsule Kaniel Kiang, MD   colchicine 0.6 MG tablet Take 2 tabs po x 1, and then take 1 tab 1 hr later 3 tablet Rondel Oh, MD      PDMP not reviewed this encounter.   Rondel Oh, MD 02/15/22 [phone]

## 2022-02-15 NOTE — ED Triage Notes (Signed)
Patient presents to [facility] for evaluation of sudden onset of left little toe pain starting this morning when he got out of the shower.  Denies falling or anything, but felt immediate pain stepping out of the shower.

## 2022-02-20 ENCOUNTER — Emergency Department (HOSPITAL_COMMUNITY)
Admission: EM | Admit: 2022-02-20 | Discharge: 2022-02-20 | Disposition: A | Discharge status: Home / Self Care | Payer: Medicare Other | Attending: Emergency Medicine | Admitting: Emergency Medicine

## 2022-02-20 ENCOUNTER — Other Ambulatory Visit: Payer: Self-pay

## 2022-02-20 DIAGNOSIS — S39012A Strain of muscle, fascia and tendon of lower back, initial encounter: Secondary | ICD-10-CM | POA: Diagnosis not present

## 2022-02-20 DIAGNOSIS — S3992XA Unspecified injury of lower back, initial encounter: Secondary | ICD-10-CM | POA: Diagnosis present

## 2022-02-20 DIAGNOSIS — X500XXA Overexertion from strenuous movement or load, initial encounter: Secondary | ICD-10-CM | POA: Insufficient documentation

## 2022-02-20 DIAGNOSIS — Z91040 Latex allergy status: Secondary | ICD-10-CM | POA: Insufficient documentation

## 2022-02-20 MED ORDER — IBUPROFEN 200 MG PO TABS
600.0000 mg | ORAL_TABLET | Freq: Once | ORAL | Status: AC
  Administered 2022-02-20: 600 mg via ORAL
  Filled 2022-02-20: qty 3

## 2022-02-20 MED ORDER — CYCLOBENZAPRINE HCL 10 MG PO TABS
10.0000 mg | ORAL_TABLET | Freq: Once | ORAL | Status: AC
  Administered 2022-02-20: 10 mg via ORAL
  Filled 2022-02-20: qty 1

## 2022-02-20 MED ORDER — LIDOCAINE 5 % EX PTCH: 1.0000 | MEDICATED_PATCH | CUTANEOUS | 0 refills | Status: DC

## 2022-02-20 MED ORDER — CYCLOBENZAPRINE HCL 10 MG PO TABS: 10.0000 mg | ORAL_TABLET | Freq: Two times a day (BID) | ORAL | 0 refills | Status: DC | PRN

## 2022-02-20 MED ORDER — ACETAMINOPHEN 500 MG PO TABS
1000.0000 mg | ORAL_TABLET | Freq: Once | ORAL | Status: AC
  Administered 2022-02-20: 1000 mg via ORAL
  Filled 2022-02-20: qty 2

## 2022-02-20 MED ORDER — LIDOCAINE 5 % EX PTCH
1.0000 | MEDICATED_PATCH | Freq: Once | CUTANEOUS | Status: DC
  Administered 2022-02-20: 1 via TRANSDERMAL
  Filled 2022-02-20: qty 1

## 2022-02-20 NOTE — Discharge Instructions (Addendum)
You were seen in the emergency department for your back pain.  You likely pulled a muscle in your back and had muscle spasm.  You had no signs of any injury to your spinal cord or broken bones.  You should continue to take Tylenol and Motrin as needed for pain.  These both can be taken up to every 6 hours.  You can also use lidocaine patches.  I have given you a few muscle relaxers that you can take as needed.  These can make you drowsy so do not take them before driving, operating heavy machinery, working or watching small children alone.  You should follow-up with your primary doctor in the next few days to have your symptoms rechecked.  You should return to the emergency department if you have numbness or weakness in your legs, you are unable to urinate, your pain gets significantly worse or if you have any other new or concerning symptoms.

## 2022-02-20 NOTE — ED Provider Triage Note (Signed)
Emergency Medicine Provider Triage Evaluation Note  Franklin [name] , a 41 y.o. male  was evaluated in triage.  Pt complains of left sided low back pain. 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 Began having left sided low back pain that he states intermittently goes into his left leg. Having difficulty with ambulation secondary to pain.   Review of Systems  Positive:  Negative: See above  Physical Exam  BP 124/77 (BP Location: Right Arm)   Pulse 65   Temp 98.6 F (37 C) (Oral)   Resp 18   SpO2 99%  Gen:   Awake, no distress   Resp:  Normal effort  MSK:   Moves extremities without difficulty  Other:  Able to stand in triage room for provider. TTP of the left paraspinal space and left upper buttock.   Medical Decision Making  Medically screening exam initiated at 5:32 PM.  Appropriate orders placed.  Celso Sickle was informed that the remainder of the evaluation will be completed by another provider, this initial triage assessment does not replace that evaluation, and the importance of remaining in the ED until their evaluation is complete.     Mickie Hillier, PA-C 02/20/22 1734

## 2022-02-20 NOTE — ED Provider Notes (Signed)
[facility] DEPT Provider Note   CSN: [id number] Arrival date & time: 02/20/22  1709     History  Chief Complaint  Patient presents with   Back Pain    Franklin [name] is a 40 y.o. male.  Patient is a 41 year old male with a past medical history of seizure disorder presenting to the emergency department with low back pain.  He states that at his job he was doing heavy lifting yesterday unloading a truck when he felt like he strained his back.  He states that he is having pain throughout the day today but had to continue to work today.  He states by the end of work his pain felt so severe he could hardly walk.  He denies any numbness or weakness, saddle anesthesia, loss of bowel or bladder function.  He states that he took a friend's Percocets with some relief.  He denies any cancer history or IV drug use history.  He denies any fevers.  He denies any prior back surgeries.  The history is provided by the patient.  Back Pain      Home Medications Prior to Admission medications   Medication Sig Start Date End Date Taking? Authorizing Provider  cyclobenzaprine (FLEXERIL) 10 MG tablet Take 1 tablet (10 mg total) by mouth 2 (two) times daily as needed for muscle spasms. 02/20/22  Yes Shaylynne Lunt, [place] K, DO  lidocaine (LIDODERM) 5 % Place 1 patch onto the skin daily. Remove & Discard patch within 12 hours or as directed by MD 02/20/22  Yes Ernesto Rutherford, Norman Herrlich, DO  colchicine 0.6 MG tablet Take 2 tabs po x 1, and then take 1 tab 1 hr later 02/15/22   Rondel Oh, MD  doxycycline (VIBRAMYCIN) 100 MG capsule Take 1 capsule (100 mg total) by mouth 2 (two) times daily. 01/30/22   Francene Finders, PA-C  indomethacin (INDOCIN) 50 MG capsule Take 1 capsule (50 mg total) by mouth 3 (three) times daily with meals. 02/15/22   Piontek, Junie Panning, MD  lamoTRIgine (LAMICTAL) 100 MG tablet Take 100 mg by mouth daily.    [provider]  lamoTRIgine (LAMICTAL) 25 MG tablet  Take 25 mg by mouth 2 (two) times daily. 03/20/20   [provider]  LORazepam (ATIVAN) 1 MG tablet Take 1 tablet (1 mg total) by mouth every 12 (twelve) hours as needed for anxiety. Patient not taking: Reported on 01/23/2022 05/03/21   Varney Biles, MD  Lumateperone Tosylate (CAPLYTA) 42 MG CAPS Take 42 mg by mouth daily. Patient not taking: Reported on 09/10/2020    [provider]  diphenhydrAMINE (BENADRYL) 25 MG tablet Take 2 tablets (50 mg total) by mouth at bedtime as needed for up to 7 days. 12/29/19 02/15/20  Darr, Edison Nasuti, PA-C  loratadine (CLARITIN) 10 MG tablet Take 1 tablet (10 mg total) by mouth daily. Patient not taking: Reported on 10/11/2019 09/18/19 12/02/19  Charlott Rakes, MD  Oxcarbazepine (TRILEPTAL) 300 MG tablet Take 1/2 tablet twice a day Patient not taking: Reported on 07/08/2019 01/26/18 07/09/19  Argentina Donovan, PA-C      Allergies    Coconut (cocos nucifera), Other, Codeine, Depakote [divalproex sodium], Hydroxyzine, Penicillins, Tape, Keflex [cephalexin], Latex, and Levetiracetam    Review of Systems   Review of Systems  Musculoskeletal:  Positive for back pain.    Physical Exam Updated Vital Signs BP 138/82 (BP Location: Left Arm)   Pulse (!) 51   Temp 97.7 F (36.5 C) (Oral)  Resp 17   SpO2 98%  Physical Exam Vitals and nursing note reviewed.  Constitutional:      General: He is not in acute distress.    Appearance: Normal appearance.  HENT:     Head: Normocephalic and atraumatic.     Nose: Nose normal.     Mouth/Throat:     Mouth: Mucous membranes are moist.     Pharynx: Oropharynx is clear.  Eyes:     Extraocular Movements: Extraocular movements intact.     Conjunctiva/sclera: Conjunctivae normal.  Neck:     Comments: No midline neck tenderness Cardiovascular:     Rate and Rhythm: Normal rate and regular rhythm.     Pulses: Normal pulses.     Heart sounds: Normal heart sounds.  Pulmonary:     Effort: Pulmonary effort is  normal.     Breath sounds: Normal breath sounds.  Abdominal:     General: Abdomen is flat.     Palpations: Abdomen is soft.     Tenderness: There is no abdominal tenderness.  Musculoskeletal:        General: Normal range of motion.     Cervical back: Normal range of motion and neck supple.     Comments: No midline back tenderness Tenderness to palpation of left lumbar paraspinal muscles Negative straight leg raise bilaterally  Skin:    General: Skin is warm and dry.  Neurological:     General: No focal deficit present.     Mental Status: He is alert and oriented to person, place, and time.     Sensory: No sensory deficit.     Motor: No weakness.  Psychiatric:        Mood and Affect: Mood normal.        Behavior: Behavior normal.     ED Results / Procedures / Treatments   Labs (all labs ordered are listed, but only abnormal results are displayed) Labs Reviewed - No data to display  EKG None  Radiology No results found.  Procedures Procedures    Medications Ordered in ED Medications  lidocaine (LIDODERM) 5 % 1-3 patch (1 patch Transdermal Patch Applied 02/20/22 1921)  acetaminophen (TYLENOL) tablet 1,000 mg (1,000 mg Oral Given 02/20/22 1921)  ibuprofen (ADVIL) tablet 600 mg (600 mg Oral Given 02/20/22 1922)  cyclobenzaprine (FLEXERIL) tablet 10 mg (10 mg Oral Given 02/20/22 1922)    ED Course/ Medical Decision Making/ A&P Clinical Course as of 02/20/22 2016  Sat Feb 20, 2022  2015 Upon reassessment, the patient's pain is significantly improved.  He is able to ambulate steadily.  He is stable for discharge with primary care follow-up. [VK]    Clinical Course User Index [VK] Ottie Glazier, DO                           Medical Decision Making This patient presents to the ED with chief complaint(s) of back pain with pertinent past medical history of seizure disorder which further complicates the presenting complaint. The complaint involves an extensive  differential diagnosis and also carries with it a high risk of complications and morbidity.    The differential diagnosis includes muscle strain or spasm, sciatica unlikely as negative straight leg raise bilaterally, fracture unlikely as no midline back tenderness and no significant trauma, patient has no focal neurologic deficits, no bowel or bladder incontinence or retention, no fevers, no IVDU, no cancer history making cauda equina or spinal epidural abscess unlikely patient  denies any urinary symptoms and has no CVA tenderness making pyelonephritis unlikely  Additional history obtained: N/A  ED Course and Reassessment: Patient has no red flag symptoms of his back pain and likely has muscle strain.  He will be given Tylenol, Motrin, lidocaine patch and Flexeril.  He states he will be able to call a friend to give him a ride home.  Independent labs interpretation:  N/A  Independent visualization of imaging: N/A  Consultation: - Consulted or discussed management/test interpretation w/ external professional: N/A  Consideration for admission or further workup: Patient has no emergent conditions requiring admission at this time and is stable for discharge with outpatient primary care follow-up. Social Determinants of health: N/A    Risk OTC drugs. Prescription drug management.          Final Clinical Impression(s) / ED Diagnoses Final diagnoses:  Strain of lumbar region, initial encounter    Rx / DC Orders ED Discharge Orders          Ordered    lidocaine (LIDODERM) 5 %  Every 24 hours        02/20/22 2015    cyclobenzaprine (FLEXERIL) 10 MG tablet  2 times daily PRN        02/20/22 2015              [place], [place], DO 02/20/22 2016

## 2022-02-20 NOTE — ED Triage Notes (Signed)
Pt reports generalized back pain after taking trash off yesterday.

## 2022-02-23 ENCOUNTER — Encounter (HOSPITAL_COMMUNITY): Payer: Self-pay | Admitting: Emergency Medicine

## 2022-02-23 ENCOUNTER — Ambulatory Visit (HOSPITAL_COMMUNITY)
Admission: EM | Admit: 2022-02-23 | Discharge: 2022-02-23 | Disposition: A | Discharge status: Home / Self Care | Payer: Medicare Other | Attending: Family Medicine | Admitting: Family Medicine

## 2022-02-23 DIAGNOSIS — H1032 Unspecified acute conjunctivitis, left eye: Secondary | ICD-10-CM | POA: Diagnosis not present

## 2022-02-23 MED ORDER — GENTAMICIN SULFATE 0.3 % OP SOLN: 2.0000 [drp] | Freq: Three times a day (TID) | OPHTHALMIC | 0 refills | Status: AC

## 2022-02-23 NOTE — Discharge Instructions (Addendum)
Put gentamicin eyedrops in your left eye 3 times daily for 5 days  Cool compresses on your left eye can help how it feels.

## 2022-02-23 NOTE — ED Provider Notes (Signed)
[facility]    CSN: [id number] Arrival date & time: 02/23/22  0909      History   Chief Complaint Chief Complaint  Patient presents with   Eye Pain    HPI Franklin [name] is a 41 y.o. male.    Eye Pain   Here for irritation and foreign body sensation of his left eye, with some redness and some drainage.  Symptoms began about 2 days ago.  He did maybe have some dried discharge on his eyelids this morning when he awoke. No fever or upper respiratory symptoms.  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)    Seizure-like activity ([place])    Seizures ([place])     Patient Active Problem List   Diagnosis Date Noted   Migraine without aura and without status migrainosus, not intractable 05/22/2018   Vertigo 05/22/2018   Psychogenic nonepileptic seizure 02/18/2017   Cholelithiasis 01/24/2017   Conversion disorder with seizures or convulsions 03/26/2016   Spells of decreased attentiveness 03/16/2016   Abnormal EKG 11/30/2015   Elevated troponin 11/30/2015   Bipolar affective disorder, most recent episode unspecified type, remission status unspecified 03/11/2015   Bipolar affective disorder ([place]) 01/08/2015   Generalized idiopathic epilepsy and epileptic syndromes, without status epilepticus, 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 No pertinent surgical history.     Home Medications     Prior to Admission medications   Medication Sig Start Date End Date Taking? Authorizing Provider  colchicine 0.6 MG tablet Take 2 tabs po x 1, and then take 1 tab 1 hr later 02/15/22   Rondel Oh, MD  cyclobenzaprine (FLEXERIL) 10 MG tablet Take 1 tablet (10 mg total) by mouth 2 (two) times daily as needed for muscle spasms. 02/20/22   Kemper Durie, DO  doxycycline (VIBRAMYCIN) 100 MG capsule Take 1 capsule (100 mg total) by mouth 2 (two) times daily. 01/30/22   Francene Finders, PA-C  indomethacin (INDOCIN) 50 MG capsule Take 1 capsule (50 mg total) by mouth 3 (three) times daily with meals. 02/15/22   Piontek, Junie Panning, MD  lamoTRIgine (LAMICTAL) 100 MG tablet Take 100 mg by mouth daily.    [provider]  lamoTRIgine (LAMICTAL) 25 MG tablet Take 25 mg by mouth 2 (two) times daily. 03/20/20   [provider]  lidocaine (LIDODERM) 5 % Place 1 patch onto the skin daily. Remove & Discard patch within 12 hours or as directed by MD 02/20/22   Leanord Asal K, DO  LORazepam (ATIVAN) 1 MG tablet Take 1 tablet (1 mg total) by mouth every 12 (twelve) hours as needed for anxiety. Patient not taking: Reported on 01/23/2022 05/03/21   Varney Biles, MD  Lumateperone Tosylate (CAPLYTA) 42 MG CAPS Take 42 mg by mouth daily. Patient not taking: Reported on 09/10/2020    [provider]  diphenhydrAMINE (BENADRYL) 25 MG tablet Take 2 tablets (50 mg total) by mouth at bedtime as needed for up to 7 days. 12/29/19 02/15/20  Darr, Edison Nasuti, PA-C  loratadine (CLARITIN) 10 MG tablet Take 1 tablet (10 mg total) by mouth daily. Patient not taking: Reported on 10/11/2019 09/18/19 12/02/19  Charlott Rakes, MD  Oxcarbazepine (TRILEPTAL) 300 MG tablet Take 1/2 tablet twice a day Patient not taking: Reported on 07/08/2019 01/26/18 07/09/19  Argentina Donovan, PA-C    Family History Family History  Problem Relation Age of Onset   Kidney failure Mother    Heart disease Mother     Social  History Social History   Tobacco Use   Smoking status: Never   Smokeless tobacco: Never  Vaping Use   Vaping Use: Never used  Substance Use Topics   Alcohol use: Not Currently    Comment: special occassions   Drug use: Never     Allergies   Coconut (cocos nucifera), Other, Codeine, Depakote [divalproex sodium], Hydroxyzine, Penicillins, Tape, Keflex [cephalexin], Latex, and Levetiracetam   Review of Systems Review of Systems  Eyes:  Positive for pain.     Physical Exam Triage Vital Signs ED Triage Vitals  Enc Vitals Group     BP 02/23/22 1012 133/87     Pulse Rate 02/23/22 1012 (!) 48     Resp 02/23/22 1012 15     Temp 02/23/22 1012 (!) 97.4 F (36.3 C)     Temp Source 02/23/22 1012 Oral     SpO2 02/23/22 1012 98 %     Weight --      Height --      Head Circumference --      Peak Flow --      Pain Score 02/23/22 1011 5     Pain Loc --      Pain Edu? --      Excl. in [place]? --    No data found.  Updated Vital Signs BP 133/87   Pulse (!) 48   Temp (!) 97.4 F (36.3 C) (Oral)   Resp 15   SpO2 98%   Visual Acuity Right Eye Distance:   Left Eye Distance:   Bilateral Distance:    Right Eye Near:   Left Eye Near:    Bilateral Near:     Physical Exam Vitals reviewed.  Constitutional:      General: He is not in acute distress.    Appearance: He is not ill-appearing, toxic-appearing or diaphoretic.  HENT:     Mouth/Throat:     Mouth: Mucous membranes are moist.  Eyes:     Extraocular Movements: Extraocular movements intact.     Pupils: Pupils are equal, round, and reactive to light.     Comments: Right eyes normal, and left eye is mildly injected on the medial portion.  There is some discharge at the inner canthus of the left eye.  The lids are not swollen  Cardiovascular:     Rate and Rhythm: Normal rate and regular rhythm.  Pulmonary:     Effort: Pulmonary effort is normal.     Breath sounds: Normal breath sounds.  Skin:    Coloration: Skin is  not jaundiced or pale.  Neurological:     General: No focal deficit present.     Mental Status: He is alert and oriented to person, place, and time.  Psychiatric:        Behavior: Behavior normal.      UC  Treatments / Results  Labs (all labs ordered are listed, but only abnormal results are displayed) Labs Reviewed - No data to display  EKG   Radiology No results found.  Procedures Procedures (including critical care time)  Medications Ordered in UC Medications - No data to display  Initial Impression / Assessment and Plan / UC Course  I have reviewed the triage vital signs and the nursing notes.  Pertinent labs & imaging results that were available during my care of the patient were reviewed by me and considered in my medical decision making (see chart for details).        We will treat with gentamicin eyedrops Final Clinical Impressions(s) / UC Diagnoses   Final diagnoses:  None   Discharge Instructions   None    ED Prescriptions   None    PDMP not reviewed this encounter.   Barrett Henle, MD 02/23/22 1048

## 2022-02-23 NOTE — ED Triage Notes (Signed)
Pt reports that feels like something in left eye for 2 days. It itches and painful. Denies visual impairment.

## 2022-03-19 ENCOUNTER — Emergency Department (HOSPITAL_COMMUNITY)
Admission: EM | Admit: 2022-03-19 | Discharge: 2022-03-19 | Discharge status: Against Medical Advice | Payer: Medicare Other | Attending: Emergency Medicine | Admitting: Emergency Medicine

## 2022-03-19 ENCOUNTER — Encounter (HOSPITAL_COMMUNITY): Payer: Self-pay

## 2022-03-19 ENCOUNTER — Other Ambulatory Visit: Payer: Self-pay

## 2022-03-19 DIAGNOSIS — Z5321 Procedure and treatment not carried out due to patient leaving prior to being seen by health care provider: Secondary | ICD-10-CM | POA: Insufficient documentation

## 2022-03-19 DIAGNOSIS — M545 Low back pain, unspecified: Secondary | ICD-10-CM | POA: Insufficient documentation

## 2022-03-19 DIAGNOSIS — M79605 Pain in left leg: Secondary | ICD-10-CM | POA: Insufficient documentation

## 2022-03-19 NOTE — ED Triage Notes (Signed)
Per EMS- patient c/o lower back pain that radiates down the left leg x 3 days. Patient states the pain has gotten progressively worse.

## 2022-03-19 NOTE — ED Provider Triage Note (Signed)
Emergency Medicine Provider Triage Evaluation Note  Franklin [name] , a 41 y.o. male  was evaluated in triage.  Pt complains of worsening left-sided lower back pain with radiation of pain down into the left upper leg.  Symptoms started 3 days ago.  This feels similar to the back pain he was seen in the ED for on 02/20/2022.  States the radiation into the leg is new.  States "I have to work tomorrow and I have no idea how going to do that".  Denies IV drug use, recent cancer history, fevers, chills, urinary or bowel incontinence, recent injury, or saddle anesthesia.  Review of Systems  Positive:  Negative: See above  Physical Exam  BP 122/66   Pulse 61   Temp 98.4 F (36.9 C) (Oral)   Resp 16   Ht '6\' 1"'$  (1.854 m)   Wt 72.6 kg   SpO2 96%   BMI 21.11 kg/m  Gen:   Awake, no distress   Resp:  Normal effort  MSK:   Moves extremities without difficulty  Other:  Positive SLR of left leg.  Left lower back muscular tenderness.  Lower extremities appear neurovascularly intact.  Medical Decision Making  Medically screening exam initiated at 1:58 PM.  Appropriate orders placed.  Celso Sickle was informed that the remainder of the evaluation will be completed by another provider, this initial triage assessment does not replace that evaluation, and the importance of remaining in the ED until their evaluation is complete.     Prince Rome, PA-C 86/76/72 1402

## 2022-03-25 ENCOUNTER — Ambulatory Visit (HOSPITAL_COMMUNITY)
Admission: EM | Admit: 2022-03-25 | Discharge: 2022-03-25 | Disposition: A | Discharge status: Home / Self Care | Payer: Medicare Other | Attending: Emergency Medicine | Admitting: Emergency Medicine

## 2022-03-25 ENCOUNTER — Encounter (HOSPITAL_COMMUNITY): Payer: Self-pay

## 2022-03-25 DIAGNOSIS — M5432 Sciatica, left side: Secondary | ICD-10-CM | POA: Diagnosis not present

## 2022-03-25 MED ORDER — DEXAMETHASONE SODIUM PHOSPHATE 10 MG/ML IJ SOLN
INTRAMUSCULAR | Status: AC
  Filled 2022-03-25: qty 1

## 2022-03-25 MED ORDER — PREDNISONE 20 MG PO TABS: 20.0000 mg | ORAL_TABLET | Freq: Every day | ORAL | 0 refills | Status: DC

## 2022-03-25 MED ORDER — TIZANIDINE HCL 4 MG PO TABS: 4.0000 mg | ORAL_TABLET | Freq: Every day | ORAL | 0 refills | Status: DC

## 2022-03-25 MED ORDER — DEXAMETHASONE SODIUM PHOSPHATE 10 MG/ML IJ SOLN
10.0000 mg | Freq: Once | INTRAMUSCULAR | Status: AC
  Administered 2022-03-25: 10 mg via INTRAMUSCULAR

## 2022-03-25 NOTE — ED Triage Notes (Signed)
Pt injured back previously. Pt states he is having back pain that, is now radiating to his legs

## 2022-03-25 NOTE — Discharge Instructions (Addendum)
Prednisone is a steroid has been sent to the pharmacy, take this medication in the morning for the next 5 days.  It can cause you to have difficulty sleeping that is why I advised that you take this first thing in the morning starting tomorrow.  You will not need to start this today being that we have already given you a steroid injection in office.   Zanaflex is a muscle relaxant that was sent to the pharmacy, will take this at night for the next 6 days.  Please do not operate heavy machinery or drive a car after taking this medication because it can make you sleepy.   I have attached information for [facility], symptoms do not improve please schedule an appointment with [facility].   You can apply heat to the area on your back.   Please stay home and get some rest today and tomorrow.

## 2022-03-25 NOTE — ED Provider Notes (Addendum)
[place]    CSN: [id number] Arrival date & time: 03/25/22  0263      History   Chief Complaint Chief Complaint  Patient presents with   Back Pain    HPI Franklin [name] is a 41 y.o. male.  Patient complaining of left-sided lower back pain that started 1 week ago.  Patient denies any fall or trauma.  Patient reports that back pain radiates down left leg.  Patient denies any changes to bowel and bladder.  Patient took 1 dose of his spouses oxycodone with relief of symptoms.  Patient reports a similar episode when the symptoms occurred.  Patient denies being diagnosed with sciatica.  Patient denies past back surgery.    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[place])    "raises the possibility of non-epileptic events" per Neuro MD office note 03/2015   Depression    Dizziness    Hx of electroencephalogram 12/2014   normal   Insomnia    Mild cognitive impairment    Psychogenic nonepileptic seizure    Schizophrenia ([place])    Schizophrenic disorder ([place])    Seizure-like activity ([place])    Seizures ([place])     Patient Active Problem List   Diagnosis Date Noted   Migraine without aura and without status migrainosus, not intractable 05/22/2018   Vertigo 05/22/2018   Psychogenic nonepileptic seizure 02/18/2017   Cholelithiasis 01/24/2017   Conversion disorder with seizures or convulsions 03/26/2016   Spells of decreased attentiveness 03/16/2016   Abnormal EKG 11/30/2015   Elevated troponin 11/30/2015   Bipolar affective disorder, most recent episode unspecified type, remission status unspecified 03/11/2015   Bipolar affective disorder ([place]) 01/08/2015   Generalized idiopathic epilepsy and epileptic syndromes, without status epilepticus, 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 No pertinent surgical history.     Home Medications    Prior to Admission medications   Medication Sig Start Date End Date Taking? Authorizing Provider  predniSONE (DELTASONE) 20 MG tablet Take 1 tablet (20 mg total) by mouth daily. 03/25/22  Yes Flossie Dibble, NP  tiZANidine (ZANAFLEX) 4 MG tablet Take 1 tablet (4 mg total) by mouth at bedtime. 03/25/22  Yes Flossie Dibble, NP  colchicine 0.6 MG tablet Take 2 tabs po x 1, and then take 1 tab 1 hr later 02/15/22   Rondel Oh, MD  cyclobenzaprine (FLEXERIL) 10 MG tablet Take 1 tablet (10 mg total) by mouth 2 (two) times daily as needed for muscle spasms. 02/20/22   Kemper Durie, DO  lamoTRIgine (LAMICTAL) 100 MG tablet Take 100 mg by mouth daily.    [provider]  lamoTRIgine (LAMICTAL) 25 MG tablet Take 25 mg by mouth 2 (two) times daily. 03/20/20   [provider]  lidocaine (LIDODERM) 5 % Place 1 patch onto the skin daily. Remove & Discard patch within 12 hours or as directed by MD 02/20/22   Leanord Asal K, DO  Lumateperone Tosylate (CAPLYTA) 42 MG CAPS Take 42 mg by mouth daily. Patient not taking: Reported on 09/10/2020    [provider]  diphenhydrAMINE (BENADRYL) 25 MG tablet Take 2 tablets (50 mg total) by mouth at bedtime as needed for up to 7 days. 12/29/19  02/15/20  Darr, Edison Nasuti, PA-C  loratadine (CLARITIN) 10 MG tablet Take 1 tablet (10 mg total) by mouth daily. Patient not taking: Reported on 10/11/2019 09/18/19 12/02/19  Charlott Rakes, MD  Oxcarbazepine (TRILEPTAL) 300 MG tablet Take 1/2 tablet twice a day Patient not taking: Reported on 07/08/2019 01/26/18 07/09/19  Argentina Donovan, PA-C    Family History Family History  Problem Relation Age of Onset   Kidney failure Mother    Heart disease Mother     Social History Social History   Tobacco Use   Smoking status: Never   Smokeless tobacco: Never  Vaping Use   Vaping  Use: Never used  Substance Use Topics   Alcohol use: Not Currently    Comment: special occassions   Drug use: Never     Allergies   Coconut (cocos nucifera), Other, Codeine, Depakote [divalproex sodium], Hydroxyzine, Penicillins, Tape, Keflex [cephalexin], Latex, and Levetiracetam   Review of Systems Review of Systems  Constitutional:  Negative for fever.  Gastrointestinal: Negative.   Genitourinary: Negative.   Musculoskeletal:  Positive for back pain, gait problem (Pain with ambulation) and myalgias (LFT lower back). Negative for arthralgias and joint swelling.  Skin:        Denies change to skin on back   Neurological:        Tingling in LFT lower extremity from site of pain, radiation down LFT leg      Physical Exam Triage Vital Signs ED Triage Vitals  Enc Vitals Group     BP 03/25/22 0826 137/83     Pulse Rate 03/25/22 0826 82     Resp 03/25/22 0826 12     Temp 03/25/22 0826 98 F (36.7 C)     Temp Source 03/25/22 0826 Oral     SpO2 03/25/22 0826 98 %     Weight --      Height --      Head Circumference --      Peak Flow --      Pain Score 03/25/22 0825 10     Pain Loc --      Pain Edu? --      Excl. in [place]? --    No data found.  Updated Vital Signs BP 137/83 (BP Location: Left Arm)   Pulse 82   Temp 98 F (36.7 C) (Oral)   Resp 12   SpO2 98%     Physical Exam Vitals and nursing note reviewed.  Constitutional:      Appearance: Normal appearance.  Musculoskeletal:     Cervical back: Normal.     Thoracic back: Normal.     Lumbar back: Spasms present. No swelling, edema, deformity, signs of trauma, lacerations, tenderness or bony tenderness. Decreased range of motion. Positive left straight leg raise test. Negative right straight leg raise test. No scoliosis.  Skin:    Findings: No erythema or rash.     Comments: No change to skin on lower back.   Neurological:     General: No focal deficit present.     Mental Status: He is alert.      UC  Treatments / Results  Labs (all labs ordered are listed, but only abnormal results are displayed) Labs Reviewed - No data to display  EKG   Radiology No results found.  Procedures Procedures (including critical care time)  Medications Ordered in UC Medications  dexamethasone (DECADRON) injection 10 mg (10 mg Intramuscular Given 03/25/22 0900)    Initial Impression / Assessment and Plan /  UC Course  I have reviewed the triage vital signs and the nursing notes.  Pertinent labs & imaging results that were available during my care of the patient were reviewed by me and considered in my medical decision making (see chart for details).     Patient is being treated for sciatica of left side.  Patient was educated on why narcotics will not be prescribed today.  Patient declined steroid injection in office after nurse had already pulled up the medication.  Patient stated he was too nervous to receive a shot.  Patient was educated that he can begin taking the steroid orally today since he declined steroid injection. Prescription for prednisone and Zanaflex was sent to the pharmacy.  Patient was made aware of possible side effects with both medications .Patient was made aware of safety precautions with muscle relaxant.  Patient was made aware that if symptoms do not improve he can follow-up with [facility].  Patient was made aware that he should rest at home for today and tomorrow.  Patient verbalized understanding of instructions.  Final Clinical Impressions(s) / UC Diagnoses   Final diagnoses:  Sciatica of left side     Discharge Instructions      Prednisone is a steroid has been sent to the pharmacy, take this medication in the morning for the next 5 days.  It can cause you to have difficulty sleeping that is why I advised that you take this first thing in the morning starting tomorrow.  You will not need to start this today being that we have already given you a steroid injection in  office.   Zanaflex is a muscle relaxant that was sent to the pharmacy, will take this at night for the next 6 days.  Please do not operate heavy machinery or drive a car after taking this medication because it can make you sleepy.   I have attached information for [facility], symptoms do not improve please schedule an appointment with [facility].   You can apply heat to the area on your back.   Please stay home and get some rest today and tomorrow.      ED Prescriptions     Medication Sig Dispense Auth. Provider   predniSONE (DELTASONE) 20 MG tablet Take 1 tablet (20 mg total) by mouth daily. 5 tablet Flossie Dibble, NP   tiZANidine (ZANAFLEX) 4 MG tablet Take 1 tablet (4 mg total) by mouth at bedtime. 6 tablet Flossie Dibble, NP      PDMP not reviewed this encounter.   Flossie Dibble, NP 03/25/22 0911    Flossie Dibble, NP 03/25/22 [phone]

## 2022-03-28 ENCOUNTER — Emergency Department (HOSPITAL_COMMUNITY)
Admission: EM | Admit: 2022-03-28 | Discharge: 2022-03-28 | Disposition: A | Discharge status: Home / Self Care | Payer: Medicare Other | Attending: Emergency Medicine | Admitting: Emergency Medicine

## 2022-03-28 ENCOUNTER — Encounter (HOSPITAL_COMMUNITY): Payer: Self-pay | Admitting: Emergency Medicine

## 2022-03-28 ENCOUNTER — Emergency Department (HOSPITAL_COMMUNITY): Payer: Medicare Other

## 2022-03-28 ENCOUNTER — Telehealth: Payer: Self-pay

## 2022-03-28 ENCOUNTER — Other Ambulatory Visit: Payer: Self-pay

## 2022-03-28 DIAGNOSIS — Z91040 Latex allergy status: Secondary | ICD-10-CM | POA: Insufficient documentation

## 2022-03-28 DIAGNOSIS — R569 Unspecified convulsions: Secondary | ICD-10-CM | POA: Insufficient documentation

## 2022-03-28 LAB — CBC WITH DIFFERENTIAL/PLATELET
Abs Immature Granulocytes: 0.02 10*3/uL (ref 0.00–0.07)
Basophils Absolute: 0 10*3/uL (ref 0.0–0.1)
Basophils Relative: 0 %
Eosinophils Absolute: 0 10*3/uL (ref 0.0–0.5)
Eosinophils Relative: 0 %
HCT: 42.4 % (ref 39.0–52.0)
Hemoglobin: 14.2 g/dL (ref 13.0–17.0)
Immature Granulocytes: 0 %
Lymphocytes Relative: 11 %
Lymphs Abs: 0.9 10*3/uL (ref 0.7–4.0)
MCH: 29.9 pg (ref 26.0–34.0)
MCHC: 33.5 g/dL (ref 30.0–36.0)
MCV: 89.3 fL (ref 80.0–100.0)
Monocytes Absolute: 0.4 10*3/uL (ref 0.1–1.0)
Monocytes Relative: 5 %
Neutro Abs: 6.7 10*3/uL (ref 1.7–7.7)
Neutrophils Relative %: 84 %
Platelets: 194 10*3/uL (ref 150–400)
RBC: 4.75 MIL/uL (ref 4.22–5.81)
RDW: 12 % (ref 11.5–15.5)
WBC: 8.1 10*3/uL (ref 4.0–10.5)
nRBC: 0 % (ref 0.0–0.2)

## 2022-03-28 LAB — RAPID URINE DRUG SCREEN, HOSP PERFORMED
Amphetamines: NOT DETECTED
Barbiturates: NOT DETECTED
Benzodiazepines: NOT DETECTED
Cocaine: NOT DETECTED
Opiates: NOT DETECTED
Tetrahydrocannabinol: NOT DETECTED

## 2022-03-28 LAB — I-STAT CHEM 8, ED
BUN: 18 mg/dL (ref 6–20)
Calcium, Ion: 1.13 mmol/L — ABNORMAL LOW (ref 1.15–1.40)
Chloride: 105 mmol/L (ref 98–111)
Creatinine, Ser: 0.7 mg/dL (ref 0.61–1.24)
Glucose, Bld: 112 mg/dL — ABNORMAL HIGH (ref 70–99)
HCT: 42 % (ref 39.0–52.0)
Hemoglobin: 14.3 g/dL (ref 13.0–17.0)
Potassium: 4 mmol/L (ref 3.5–5.1)
Sodium: 139 mmol/L (ref 135–145)
TCO2: 23 mmol/L (ref 22–32)

## 2022-03-28 LAB — CBG MONITORING, ED: Glucose-Capillary: 113 mg/dL — ABNORMAL HIGH (ref 70–99)

## 2022-03-28 LAB — ETHANOL: Alcohol, Ethyl (B): <10 mg/dL (ref <10)

## 2022-03-28 MED ORDER — LAMOTRIGINE 25 MG PO TABS: 25.0000 mg | ORAL_TABLET | Freq: Two times a day (BID) | ORAL | 0 refills | Status: DC

## 2022-03-28 MED ORDER — SODIUM CHLORIDE 0.9 % IV SOLN
1000.0000 mg | INTRAVENOUS | Status: AC
  Administered 2022-03-28: 1000 mg via INTRAVENOUS
  Filled 2022-03-28: qty 20

## 2022-03-28 NOTE — ED Provider Notes (Signed)
[facility] EMERGENCY DEPARTMENT Provider Note   CSN: [id number] Arrival date & time: 03/28/22  7681     History  Chief Complaint  Patient presents with   Seizures    Pt brought to ED by EMS for c/o one episode of seizure witness by family prior to EMS arrived to his house, incontinence for urine. Pt refuses to have IV started by EMS and refuses to have IV started on the ED. No seizures with EMS.    Franklin [name] is a 41 y.o. male.  The history is provided by the EMS personnel. The history is limited by the condition of the patient.  Seizures Seizure activity on arrival: no   Seizure type:  Grand mal Initial focality:  None Episode characteristics: generalized shaking and unresponsiveness   Postictal symptoms: confusion, memory loss and somnolence   Severity:  Mild Timing:  Once Number of seizures this episode:  1 Progression:  Resolved Context: not flashing visual stimuli   Recent head injury:  No recent head injuries PTA treatment:  None History of seizures: yes        Home Medications Prior to Admission medications   Medication Sig Start Date End Date Taking? Authorizing Provider  colchicine 0.6 MG tablet Take 2 tabs po x 1, and then take 1 tab 1 hr later 02/15/22   Rondel Oh, MD  cyclobenzaprine (FLEXERIL) 10 MG tablet Take 1 tablet (10 mg total) by mouth 2 (two) times daily as needed for muscle spasms. 02/20/22   Kemper Durie, DO  lamoTRIgine (LAMICTAL) 100 MG tablet Take 100 mg by mouth daily.    [provider]  lamoTRIgine (LAMICTAL) 25 MG tablet Take 25 mg by mouth 2 (two) times daily. 03/20/20   [provider]  lidocaine (LIDODERM) 5 % Place 1 patch onto the skin daily. Remove & Discard patch within 12 hours or as directed by MD 02/20/22   Leanord Asal K, DO  Lumateperone Tosylate (CAPLYTA) 42 MG CAPS Take 42 mg by mouth daily. Patient not taking: Reported on 09/10/2020    [provider]   predniSONE (DELTASONE) 20 MG tablet Take 1 tablet (20 mg total) by mouth daily. 03/25/22   Flossie Dibble, NP  tiZANidine (ZANAFLEX) 4 MG tablet Take 1 tablet (4 mg total) by mouth at bedtime. 03/25/22   Flossie Dibble, NP  diphenhydrAMINE (BENADRYL) 25 MG tablet Take 2 tablets (50 mg total) by mouth at bedtime as needed for up to 7 days. 12/29/19 02/15/20  Darr, Edison Nasuti, PA-C  loratadine (CLARITIN) 10 MG tablet Take 1 tablet (10 mg total) by mouth daily. Patient not taking: Reported on 10/11/2019 09/18/19 12/02/19  Charlott Rakes, MD  Oxcarbazepine (TRILEPTAL) 300 MG tablet Take 1/2 tablet twice a day Patient not taking: Reported on 07/08/2019 01/26/18 07/09/19  Argentina Donovan, PA-C      Allergies    Coconut (cocos nucifera), Other, Codeine, Depakote [divalproex sodium], Hydroxyzine, Penicillins, Tape, Keflex [cephalexin], Latex, and Levetiracetam    Review of Systems   Review of Systems  Constitutional:  Negative for fever.  HENT:  Negative for facial swelling.   Eyes:  Negative for visual disturbance.  Respiratory:  Negative for wheezing and stridor.   Neurological:  Positive for seizures.  All other systems reviewed and are negative.   Physical Exam Updated Vital Signs BP (!) 121/93   Pulse (!) 48   Temp 98 F (36.7 C) (Oral)   Resp 16   Ht '6\' 1"'$  (  1.854 m)   Wt 72.6 kg   SpO2 96%   BMI 21.12 kg/m  Physical Exam Vitals and nursing note reviewed.  Constitutional:      General: He is not in acute distress.    Appearance: He is well-developed. He is not diaphoretic.  HENT:     Head: Normocephalic and atraumatic.     Nose: Nose normal.  Eyes:     Conjunctiva/sclera: Conjunctivae normal.     Pupils: Pupils are equal, round, and reactive to light.  Cardiovascular:     Rate and Rhythm: Normal rate and regular rhythm.  Pulmonary:     Effort: Pulmonary effort is normal.     Breath sounds: Normal breath sounds. No wheezing or rales.  Abdominal:     General: Bowel sounds  are normal.     Palpations: Abdomen is soft.     Tenderness: There is no abdominal tenderness. There is no guarding or rebound.  Musculoskeletal:        General: Normal range of motion.     Cervical back: Normal range of motion and neck supple.  Skin:    General: Skin is warm and dry.     Capillary Refill: Capillary refill takes less than 2 seconds.  Neurological:     General: No focal deficit present.     Mental Status: He is alert and oriented to person, place, and time.  Psychiatric:        Mood and Affect: Mood normal.     ED Results / Procedures / Treatments   Labs (all labs ordered are listed, but only abnormal results are displayed) Results for orders placed or performed during the hospital encounter of 03/28/22  CBC with Differential  Result Value Ref Range   WBC 8.1 4.0 - 10.5 K/uL   RBC 4.75 4.22 - 5.81 MIL/uL   Hemoglobin 14.2 13.0 - 17.0 g/dL   HCT 42.4 39.0 - 52.0 %   MCV 89.3 80.0 - 100.0 fL   MCH 29.9 26.0 - 34.0 pg   MCHC 33.5 30.0 - 36.0 g/dL   RDW 12.0 11.5 - 15.5 %   Platelets 194 150 - 400 K/uL   nRBC 0.0 0.0 - 0.2 %   Neutrophils Relative % 84 %   Neutro Abs 6.7 1.7 - 7.7 K/uL   Lymphocytes Relative 11 %   Lymphs Abs 0.9 0.7 - 4.0 K/uL   Monocytes Relative 5 %   Monocytes Absolute 0.4 0.1 - 1.0 K/uL   Eosinophils Relative 0 %   Eosinophils Absolute 0.0 0.0 - 0.5 K/uL   Basophils Relative 0 %   Basophils Absolute 0.0 0.0 - 0.1 K/uL   Immature Granulocytes 0 %   Abs Immature Granulocytes 0.02 0.00 - 0.07 K/uL  Rapid urine drug screen (hospital performed)  Result Value Ref Range   Opiates NONE DETECTED NONE DETECTED   Cocaine NONE DETECTED NONE DETECTED   Benzodiazepines NONE DETECTED NONE DETECTED   Amphetamines NONE DETECTED NONE DETECTED   Tetrahydrocannabinol NONE DETECTED NONE DETECTED   Barbiturates NONE DETECTED NONE DETECTED  Ethanol  Result Value Ref Range   Alcohol, Ethyl (B) <10 <10 mg/dL  CBG monitoring, ED  Result Value Ref Range    Glucose-Capillary 113 (H) 70 - 99 mg/dL  I-stat chem 8, ED (not at [facility] or [facility])  Result Value Ref Range   Sodium 139 135 - 145 mmol/L   Potassium 4.0 3.5 - 5.1 mmol/L   Chloride 105 98 - 111 mmol/L  BUN 18 6 - 20 mg/dL   Creatinine, Ser 0.70 0.61 - 1.24 mg/dL   Glucose, Bld 112 (H) 70 - 99 mg/dL   Calcium, Ion 1.13 (L) 1.15 - 1.40 mmol/L   TCO2 23 22 - 32 mmol/L   Hemoglobin 14.3 13.0 - 17.0 g/dL   HCT 42.0 39.0 - 52.0 %   No results found.   EKG  EKG Interpretation  Date/Time:  Sunday March 28 2022 00:40:31 EDT Ventricular Rate:  59 PR Interval:  153 QRS Duration: 119 QT Interval:  411 QTC Calculation: 408 R Axis:   -21 Text Interpretation: Sinus rhythm Nonspecific intraventricular conduction delay Confirmed by Randal Buba, Davarius Ridener ([id number]) on 03/28/2022 3:09:22 AM         Radiology No results found.  Procedures Procedures    Medications Ordered in ED Medications  fosPHENYtoin (CEREBYX) 1,000 mg PE in sodium chloride 0.9 % 50 mL IVPB (0 mg PE Intravenous Stopped 03/28/22 0230)    ED Course/ Medical Decision Making/ A&P                           Medical Decision Making Patient with seizures had a witnessed seizure at home   Amount and/or Complexity of Data Reviewed Independent Historian: EMS    Details: See above  External Data Reviewed: notes.    Details: Previous notes reviewed  Labs: ordered.    Details: All labs reviewed: normal CBC: normal white count 8.1 and normal hemoglobin 14.2, normal platelet count.  UDS is negative.  ETOH negative. Normal sodium 139, normal potassium 4.0, normal creatinine.   Radiology: ordered and independent interpretation performed.    Details: Negative head CT  Risk Prescription drug management. Risk Details: Loaded with fosphenytoin in the ED. Will start dilantin as an outpatient and refer to neurology.  No driving for six month or until cleared by neurology. This was communicated verbally and on discharge paperwork.   Stable for discharge.  Strict return     Final Clinical Impression(s) / ED Diagnoses Final diagnoses:  Seizure-like activity ([place])   Return for intractable cough, coughing up blood, fevers > 100.4 unrelieved by medication, shortness of breath, intractable vomiting, chest pain, shortness of breath, weakness, numbness, changes in speech, facial asymmetry, abdominal pain, passing out, Inability to tolerate liquids or food, cough, altered mental status or any concerns. No signs of systemic illness or infection. The patient is nontoxic-appearing on exam and vital signs are within normal limits.  I have reviewed the triage vital signs and the nursing notes. Pertinent labs & imaging results that were available during my care of the patient were reviewed by me and considered in my medical decision making (see chart for details). After history, exam, and medical workup I feel the patient has been appropriately medically screened and is safe for discharge home. Pertinent diagnoses were discussed with the patient. Patient was given return precautions.    Rx / DC Orders ED Discharge Orders     None         Damein Gaunce, MD 03/28/22 [phone]

## 2022-03-28 NOTE — Discharge Instructions (Signed)
No driving until cleared by neurology

## 2022-03-28 NOTE — ED Notes (Signed)
Wife Janace Hoard [phone] wants a call back immediately, she wants to know what medicine we're giving her husband

## 2022-03-28 NOTE — ED Triage Notes (Signed)
Pt refusing labs or IV  access on the ED.

## 2022-03-28 NOTE — Telephone Encounter (Signed)
Patient was discharged early this morning. He went to get his prescription, but insurance will not cover it, lamictal. Discussed with Dr On today Mariane Masters, who recommended patient follow up tomorrow with his PCP and neurology for medication .Patient called with this feedback

## 2022-03-29 ENCOUNTER — Encounter (HOSPITAL_COMMUNITY): Payer: Self-pay

## 2022-03-29 ENCOUNTER — Other Ambulatory Visit: Payer: Self-pay

## 2022-03-29 ENCOUNTER — Emergency Department (HOSPITAL_COMMUNITY)
Admission: EM | Admit: 2022-03-29 | Discharge: 2022-03-29 | Disposition: A | Discharge status: Home / Self Care | Payer: Medicare Other | Attending: Emergency Medicine | Admitting: Emergency Medicine

## 2022-03-29 DIAGNOSIS — Z91040 Latex allergy status: Secondary | ICD-10-CM | POA: Insufficient documentation

## 2022-03-29 DIAGNOSIS — M545 Low back pain, unspecified: Secondary | ICD-10-CM | POA: Diagnosis present

## 2022-03-29 DIAGNOSIS — M5442 Lumbago with sciatica, left side: Secondary | ICD-10-CM | POA: Diagnosis not present

## 2022-03-29 MED ORDER — PREDNISONE 20 MG PO TABS: 40.0000 mg | ORAL_TABLET | Freq: Every day | ORAL | 0 refills | Status: DC

## 2022-03-29 MED ORDER — METHOCARBAMOL 500 MG PO TABS: 500.0000 mg | ORAL_TABLET | Freq: Two times a day (BID) | ORAL | 0 refills | Status: DC

## 2022-03-29 MED ORDER — OXYCODONE-ACETAMINOPHEN 5-325 MG PO TABS
1.0000 | ORAL_TABLET | Freq: Once | ORAL | Status: AC
  Administered 2022-03-29: 1 via ORAL
  Filled 2022-03-29: qty 1

## 2022-03-29 MED ORDER — ONDANSETRON HCL 4 MG PO TABS: 4.0000 mg | ORAL_TABLET | Freq: Four times a day (QID) | ORAL | 0 refills | Status: DC

## 2022-03-29 MED ORDER — ONDANSETRON 4 MG PO TBDP
4.0000 mg | ORAL_TABLET | Freq: Once | ORAL | Status: AC
  Administered 2022-03-29: 4 mg via ORAL
  Filled 2022-03-29: qty 1

## 2022-03-29 NOTE — ED Triage Notes (Signed)
Complaining of left leg pain that started a few days ago. Radiates from the hip down to the lower leg.

## 2022-03-29 NOTE — ED Provider Notes (Signed)
[facility] DEPT Provider Note   CSN: [id number] Arrival date & time: 03/29/22  9233     History  Chief Complaint  Patient presents with   Leg Pain    Franklin [name] is a 41 y.o. male with past medical history significant for seizure disorder, bipolar disorder who presents with concern for left lower back, hip pain radiating down left leg.  Patient reports worse with ambulation.  Patient was seen evaluated in urgent care few days ago and diagnosed with sciatica, however he reports that he was unable to tolerate the prednisone, and Zanaflex secondary to nausea and vomiting.  Patient does not have any nausea medication.  He has otherwise been trying ibuprofen, Tylenol, but reports no significant relief.  He denies any recent injury.  He denies persistent numbness, tingling other than the radiating tingling sensation with symptom exacerbation.  No previous diagnosis of sciatica, no previous back surgery.  Denies history of chronic corticosteroid use, IV drug use, previous cancer, no recent fevers.   Leg Pain Associated symptoms: back pain        Home Medications Prior to Admission medications   Medication Sig Start Date End Date Taking? Authorizing Provider  methocarbamol (ROBAXIN) 500 MG tablet Take 1 tablet (500 mg total) by mouth 2 (two) times daily. 03/29/22  Yes Hellen Shanley H, PA-C  ondansetron (ZOFRAN) 4 MG tablet Take 1 tablet (4 mg total) by mouth every 6 (six) hours. 03/29/22  Yes Emmett Arntz H, PA-C  predniSONE (DELTASONE) 20 MG tablet Take 2 tablets (40 mg total) by mouth daily. 03/29/22  Yes Jumana Paccione H, PA-C  colchicine 0.6 MG tablet Take 2 tabs po x 1, and then take 1 tab 1 hr later 02/15/22   Rondel Oh, MD  cyclobenzaprine (FLEXERIL) 10 MG tablet Take 1 tablet (10 mg total) by mouth 2 (two) times daily as needed for muscle spasms. 02/20/22   Kemper Durie, DO  lamoTRIgine (LAMICTAL) 100 MG tablet Take 100  mg by mouth daily.    [provider]  lamoTRIgine (LAMICTAL) 25 MG tablet Take 1 tablet (25 mg total) by mouth 2 (two) times daily. 03/28/22   Palumbo, April, MD  lidocaine (LIDODERM) 5 % Place 1 patch onto the skin daily. Remove & Discard patch within 12 hours or as directed by MD 02/20/22   Leanord Asal K, DO  Lumateperone Tosylate (CAPLYTA) 42 MG CAPS Take 42 mg by mouth daily. Patient not taking: Reported on 09/10/2020    [provider]  diphenhydrAMINE (BENADRYL) 25 MG tablet Take 2 tablets (50 mg total) by mouth at bedtime as needed for up to 7 days. 12/29/19 02/15/20  Darr, Edison Nasuti, PA-C  loratadine (CLARITIN) 10 MG tablet Take 1 tablet (10 mg total) by mouth daily. Patient not taking: Reported on 10/11/2019 09/18/19 12/02/19  Charlott Rakes, MD  Oxcarbazepine (TRILEPTAL) 300 MG tablet Take 1/2 tablet twice a day Patient not taking: Reported on 07/08/2019 01/26/18 07/09/19  Argentina Donovan, PA-C      Allergies    Coconut (cocos nucifera), Other, Codeine, Depakote [divalproex sodium], Hydroxyzine, Penicillins, Tape, Keflex [cephalexin], Latex, and Levetiracetam    Review of Systems   Review of Systems  Musculoskeletal:  Positive for back pain.  All other systems reviewed and are negative.   Physical Exam Updated Vital Signs BP (!) 132/91 (BP Location: Right Arm)   Pulse (!) 58   Temp 97.9 F (36.6 C) (Oral)   Resp 18   Ht 6' (  1.829 m)   Wt 65.8 kg   SpO2 98%   BMI 19.67 kg/m  Physical Exam Vitals and nursing note reviewed.  Constitutional:      General: He is not in acute distress.    Appearance: Normal appearance.  HENT:     Head: Normocephalic and atraumatic.  Eyes:     General:        Right eye: No discharge.        Left eye: No discharge.  Cardiovascular:     Rate and Rhythm: Normal rate and regular rhythm.     Pulses: Normal pulses.  Pulmonary:     Effort: Pulmonary effort is normal. No respiratory distress.  Musculoskeletal:        General:  No deformity.     Comments: Patient with intact strength 5/5 bilateral lower extremities to flexion, extension, abduction, and adduction at the hip.  He does have positive straight leg raise on the left.  He has some tenderness over the left greater trochanter, as well as the left lumbar paraspinous muscles.  No midline lumbar tenderness.  Skin:    General: Skin is warm and dry.     Capillary Refill: Capillary refill takes less than 2 seconds.  Neurological:     Mental Status: He is alert and oriented to person, place, and time.  Psychiatric:        Mood and Affect: Mood normal.        Behavior: Behavior normal.     ED Results / Procedures / Treatments   Labs (all labs ordered are listed, but only abnormal results are displayed) Labs Reviewed - No data to display  EKG None  Radiology CT Head Wo Contrast  Result Date: 03/28/2022 CLINICAL DATA:  Seizure EXAM: CT HEAD WITHOUT CONTRAST TECHNIQUE: Contiguous axial images were obtained from the base of the skull through the vertex without intravenous contrast. RADIATION DOSE REDUCTION: This exam was performed according to the departmental dose-optimization program which includes automated exposure control, adjustment of the mA and/or kV according to patient size and/or use of iterative reconstruction technique. COMPARISON:  01/23/2022 FINDINGS: Brain: No evidence of acute infarction, hemorrhage, mass, mass effect, or midline shift. No hydrocephalus or extra-axial fluid collection. Vascular: No hyperdense vessel. Skull: Normal. Negative for fracture or focal lesion. Sinuses/Orbits: No acute finding. Other: The mastoid air cells are well aerated. IMPRESSION: No acute intracranial process. Electronically Signed   By: Merilyn Baba M.D.   On: 03/28/2022 04:18    Procedures Procedures    Medications Ordered in ED Medications  oxyCODONE-acetaminophen (PERCOCET/ROXICET) 5-325 MG per tablet 1 tablet (1 tablet Oral Given 03/29/22 1117)   ondansetron (ZOFRAN-ODT) disintegrating tablet 4 mg (4 mg Oral Given 03/29/22 1117)    ED Course/ Medical Decision Making/ A&P                           Medical Decision Making Risk Prescription drug management.  This an overall well-appearing 41 year old male who presents with concern for left hip, back pain radiating down left leg.  The emergent differential diagnosis includes sciatica, considered central cord compression, cauda equina, compression fracture, other acute fracture, dislocation.  On my exam patient is neurovascularly intact, signs and symptoms are consistent with sciatica versus piriformis syndrome, he does have positive straight leg raise on the left.  I agree with urgent care evaluation of sciatica.  Discussed with patient that I recommend steroids and muscle relaxant as well, but we will  provide a new prescription, and some Zofran so that he can tolerate the medications.  Discussed that he should take the medications 30 minutes to 1 hour after the Zofran to make sure that he does not vomit.  Encourage orthopedic follow-up, as well as rehab exercises for sciatica.  Patient understands and agrees to plan, he is able to ambulate prior to discharge.  Patient discharged in stable condition at this time. Final Clinical Impression(s) / ED Diagnoses Final diagnoses:  Acute left-sided low back pain with left-sided sciatica    Rx / DC Orders ED Discharge Orders          Ordered    predniSONE (DELTASONE) 20 MG tablet  Daily        03/29/22 1109    methocarbamol (ROBAXIN) 500 MG tablet  2 times daily        03/29/22 1109    ondansetron (ZOFRAN) 4 MG tablet  Every 6 hours        03/29/22 1109              Dezyrae Kensinger, [place] H, PA-C 03/29/22 1118    Gareth Morgan, MD 03/30/22 1041

## 2022-03-29 NOTE — Discharge Instructions (Addendum)
I prescribed you a new prescription for the steroids that will help to calm down the inflammation in your back, as well as the muscle relaxant that will help with the pain.  Since you are not tolerating these medications with stomach upset I have also prescribed you some nausea medication.  To ensure that you do not have any nausea and vomiting I would take the nausea medication 30 minutes to 1 hour prior to the other medications.  Additionally when your pain is under better control I recommend using the sciatica rehab exercises that I have attached above.

## 2022-05-27 ENCOUNTER — Encounter (HOSPITAL_BASED_OUTPATIENT_CLINIC_OR_DEPARTMENT_OTHER): Payer: Self-pay | Admitting: Emergency Medicine

## 2022-05-27 ENCOUNTER — Emergency Department (HOSPITAL_BASED_OUTPATIENT_CLINIC_OR_DEPARTMENT_OTHER)
Admission: EM | Admit: 2022-05-27 | Discharge: 2022-05-27 | Disposition: A | Discharge status: Home / Self Care | Payer: Medicare Other | Attending: Emergency Medicine | Admitting: Emergency Medicine

## 2022-05-27 ENCOUNTER — Emergency Department (HOSPITAL_BASED_OUTPATIENT_CLINIC_OR_DEPARTMENT_OTHER): Payer: Medicare Other

## 2022-05-27 ENCOUNTER — Other Ambulatory Visit: Payer: Self-pay

## 2022-05-27 DIAGNOSIS — N5082 Scrotal pain: Secondary | ICD-10-CM | POA: Diagnosis present

## 2022-05-27 DIAGNOSIS — L03314 Cellulitis of groin: Secondary | ICD-10-CM | POA: Insufficient documentation

## 2022-05-27 LAB — URINALYSIS, ROUTINE W REFLEX MICROSCOPIC
Bilirubin Urine: NEGATIVE
Glucose, UA: NEGATIVE mg/dL
Hgb urine dipstick: NEGATIVE
Ketones, ur: NEGATIVE mg/dL
Leukocytes,Ua: NEGATIVE
Nitrite: NEGATIVE
Protein, ur: NEGATIVE mg/dL
Specific Gravity, Urine: 1.025 (ref 1.005–1.030)
pH: 7 (ref 5.0–8.0)

## 2022-05-27 MED ORDER — CLINDAMYCIN HCL 300 MG PO CAPS: 300.0000 mg | ORAL_CAPSULE | Freq: Three times a day (TID) | ORAL | 0 refills | Status: AC

## 2022-05-27 NOTE — ED Provider Notes (Signed)
[facility] EMERGENCY DEPT Provider Note   CSN: [id number] Arrival date & time: 05/27/22  0844     History  Chief Complaint  Patient presents with   Testicle Pain    Franklin [name] is a 41 y.o. male.  41 year old male who presents to the emergency department with left-sided scrotal pain.  Patient states that 2 days ago started feeling left scrotal pain.  Denies any dysuria or frequency.  No penile discharge and has monogamous with his wife.  No concerns for STIs.  Denies any fevers.  No painful defecation.  No rashes noted.        Home Medications Prior to Admission medications   Medication Sig Start Date End Date Taking? Authorizing Provider  clindamycin (CLEOCIN) 300 MG capsule Take 1 capsule (300 mg total) by mouth 3 (three) times daily for 7 days. 05/27/22 06/03/22 Yes Fransico Meadow, MD  colchicine 0.6 MG tablet Take 2 tabs po x 1, and then take 1 tab 1 hr later 02/15/22   Rondel Oh, MD  cyclobenzaprine (FLEXERIL) 10 MG tablet Take 1 tablet (10 mg total) by mouth 2 (two) times daily as needed for muscle spasms. 02/20/22   Kemper Durie, DO  lamoTRIgine (LAMICTAL) 100 MG tablet Take 100 mg by mouth daily.    [provider]  lamoTRIgine (LAMICTAL) 25 MG tablet Take 1 tablet (25 mg total) by mouth 2 (two) times daily. 03/28/22   Palumbo, April, MD  lidocaine (LIDODERM) 5 % Place 1 patch onto the skin daily. Remove & Discard patch within 12 hours or as directed by MD 02/20/22   Leanord Asal K, DO  Lumateperone Tosylate (CAPLYTA) 42 MG CAPS Take 42 mg by mouth daily. Patient not taking: Reported on 09/10/2020    [provider]  methocarbamol (ROBAXIN) 500 MG tablet Take 1 tablet (500 mg total) by mouth 2 (two) times daily. 03/29/22   Prosperi, Christian H, PA-C  ondansetron (ZOFRAN) 4 MG tablet Take 1 tablet (4 mg total) by mouth every 6 (six) hours. 03/29/22   Prosperi, Christian H, PA-C  predniSONE (DELTASONE) 20 MG tablet Take  2 tablets (40 mg total) by mouth daily. 03/29/22   Prosperi, Christian H, PA-C  diphenhydrAMINE (BENADRYL) 25 MG tablet Take 2 tablets (50 mg total) by mouth at bedtime as needed for up to 7 days. 12/29/19 02/15/20  Darr, Edison Nasuti, PA-C  loratadine (CLARITIN) 10 MG tablet Take 1 tablet (10 mg total) by mouth daily. Patient not taking: Reported on 10/11/2019 09/18/19 12/02/19  Charlott Rakes, MD  Oxcarbazepine (TRILEPTAL) 300 MG tablet Take 1/2 tablet twice a day Patient not taking: Reported on 07/08/2019 01/26/18 07/09/19  Argentina Donovan, PA-C      Allergies    Coconut (cocos nucifera), Other, Codeine, Depakote [divalproex sodium], Hydroxyzine, Penicillins, Tape, Keflex [cephalexin], Latex, and Levetiracetam    Review of Systems   Review of Systems  Physical Exam Updated Vital Signs BP (!) 124/91 (BP Location: Right Arm)   Pulse (!) 43   Temp 97.7 F (36.5 C)   Resp 16   Ht 6' (1.829 m)   Wt 71.7 kg   SpO2 100%   BMI 21.43 kg/m  Physical Exam Constitutional:      Appearance: Normal appearance.  HENT:     Head: Normocephalic and atraumatic.     Right Ear: External ear normal.     Left Ear: External ear normal.     Nose: Nose normal.  Eyes:     Extraocular  Movements: Extraocular movements intact.  Pulmonary:     Effort: Pulmonary effort is normal.  Abdominal:     General: There is no distension.     Palpations: There is no mass.     Tenderness: There is no abdominal tenderness. There is no guarding.  Genitourinary:    Comments: Chaperoned by EMT Josh.  Tenderness to palpation along base of left scrotum with small area of induration and erythema.  No epididymal tenderness to palpation.  No tenderness palpation perineum. Musculoskeletal:     Cervical back: Normal range of motion and neck supple.  Neurological:     Mental Status: He is alert.     ED Results / Procedures / Treatments   Labs (all labs ordered are listed, but only abnormal results are displayed) Labs Reviewed   URINALYSIS, ROUTINE W REFLEX MICROSCOPIC  GC/CHLAMYDIA PROBE AMP (Secretary) NOT AT [facility]    EKG None  Radiology US SCROTUM W/DOPPLER  Result Date: 05/27/2022 CLINICAL DATA:  Left-sided scrotal pain EXAM: SCROTAL ULTRASOUND DOPPLER ULTRASOUND OF THE TESTICLES TECHNIQUE: Complete ultrasound examination of the testicles, epididymis, and other scrotal structures was performed. Color and spectral Doppler ultrasound were also utilized to evaluate blood flow to the testicles. COMPARISON:  None Available. FINDINGS: Right testicle Measurements: 4.6 x 1.6 x 3.4 cm. No mass or microlithiasis visualized. Left testicle Measurements: 4.7 x 2.0 x 2.9 cm. No mass or microlithiasis visualized. Right epididymis:  Normal in size and appearance. Left epididymis:  Normal in size and appearance. Hydrocele:  None visualized. Varicocele:  None visualized. Pulsed Doppler interrogation of both testes demonstrates normal low resistance arterial and venous waveforms bilaterally. Soft tissue swelling lateral to the left testicle with hypervascularity on Doppler. IMPRESSION: 1. Soft tissue swelling lateral to the left testicle with hypervascularity on Doppler, concerning for cellulitis. 2. No evidence of testicular torsion or mass. Electronically Signed   By: Franchot Gallo M.D.   On: 05/27/2022 10:17    Procedures Procedures   Medications Ordered in ED Medications - No data to display  ED Course/ Medical Decision Making/ A&P                           Medical Decision Making Amount and/or Complexity of Data Reviewed Labs: ordered. Radiology: ordered.  Risk Prescription drug management.   Franklin [name] is a 41 y.o. male who presents emergency department with left scrotal pain  Initial Ddx:  Testicular torsion, STI, epididymitis, UTI, prostatitis, cellulitis  MDM:  Differential includes the above diagnoses and will obtain ultrasound to evaluate for torsion as well as secondary signs of epididymitis this  feel these are less likely.  Will obtain urinalysis to evaluate for UTI.  Feel that prostatitis less likely since he is not tender in his perineum.  STI less likely given the fact that he is monogamous with his wife.    Plan:  Ultrasound Urinalysis GC chlamydia  ED Summary/Re-evaluation:  Ultrasound showed possible developing cellulitis of the scrotum.  No additional concerning findings.  Patient without crepitance or exam concerning for Fournier's gangrene.  Patient will be treated with clindamycin given his allergies.  Will have him follow-up with his primary doctor and urology.  This patient presents to the ED for concern of complaints listed in HPI, this involves an extensive number of treatment options, and is a complaint that carries with it a high risk of complications and morbidity. Disposition including potential need for admission considered.   Dispo:  DC Home. Return precautions discussed including, but not limited to, those listed in the AVS. Allowed pt time to ask questions which were answered fully prior to dc.   Records reviewed Outpatient Clinic Notes The following labs were independently interpreted: Urinalysis and show no acute abnormality I have reviewed the patients home medications and made adjustments as needed   Final Clinical Impression(s) / ED Diagnoses Final diagnoses:  Cellulitis of left groin    Rx / DC Orders ED Discharge Orders          Ordered    clindamycin (CLEOCIN) 300 MG capsule  3 times daily        05/27/22 1048              Fransico Meadow, MD 05/28/22 1542

## 2022-05-27 NOTE — ED Triage Notes (Signed)
Pt arrives to ED with c/o x2 days of left sided scrotal pain.

## 2022-05-27 NOTE — ED Notes (Signed)
Did not visualize, pain to the left upper abdominal area and left side of scrotum. No trouble urinating or BM. No bleeding for either. No recent injury to note (no straining/lifting any thing heavy). States it does not feel like anything extra is in his scrotum or feels like its bruised or discolored. No N/V. Pt sitting playing on the phone.

## 2022-05-27 NOTE — ED Notes (Signed)
Discharge paperwork given and verbally understood. 

## 2022-05-27 NOTE — Discharge Instructions (Signed)
You were seen for your left testicle pain in the emergency department.  It is likely that your symptoms are from cellulitis (skin infection).  At home, please take Tylenol and ibuprofen for your pain.  Please also take the clindamycin we have prescribed you to treat your skin infection.    Check your MyChart online for the results of any tests that had not resulted by the time you left the emergency department.   Follow-up with your primary doctor in 2-3 days regarding your visit.  Follow-up with your urologist if your symptoms do not improve in 1 week.  Return immediately to the emergency department if you experience any of the following: Worsening pain, fevers, or any other concerning symptoms.    Thank you for visiting our Emergency Department. It was a pleasure taking care of you today.

## 2022-05-27 NOTE — ED Notes (Signed)
Pt aware of the need for urine, unable to at this time.

## 2022-05-28 LAB — GC/CHLAMYDIA PROBE AMP (~~LOC~~) NOT AT ARMC
Chlamydia: NEGATIVE
Comment: NEGATIVE
Comment: NORMAL
Neisseria Gonorrhea: NEGATIVE

## 2022-06-05 ENCOUNTER — Emergency Department (HOSPITAL_COMMUNITY)
Admission: EM | Admit: 2022-06-05 | Discharge: 2022-06-05 | Disposition: A | Discharge status: Home / Self Care | Payer: Medicare Other | Attending: Emergency Medicine | Admitting: Emergency Medicine

## 2022-06-05 ENCOUNTER — Encounter (HOSPITAL_COMMUNITY): Payer: Self-pay

## 2022-06-05 DIAGNOSIS — Z91040 Latex allergy status: Secondary | ICD-10-CM | POA: Insufficient documentation

## 2022-06-05 DIAGNOSIS — Z1152 Encounter for screening for COVID-19: Secondary | ICD-10-CM | POA: Diagnosis not present

## 2022-06-05 DIAGNOSIS — J111 Influenza due to unidentified influenza virus with other respiratory manifestations: Secondary | ICD-10-CM | POA: Insufficient documentation

## 2022-06-05 DIAGNOSIS — R112 Nausea with vomiting, unspecified: Secondary | ICD-10-CM | POA: Diagnosis present

## 2022-06-05 LAB — CBC WITH DIFFERENTIAL/PLATELET
Abs Immature Granulocytes: 0.02 10*3/uL (ref 0.00–0.07)
Basophils Absolute: 0 10*3/uL (ref 0.0–0.1)
Basophils Relative: 0 %
Eosinophils Absolute: 0 10*3/uL (ref 0.0–0.5)
Eosinophils Relative: 0 %
HCT: 40.3 % (ref 39.0–52.0)
Hemoglobin: 13.5 g/dL (ref 13.0–17.0)
Immature Granulocytes: 0 %
Lymphocytes Relative: 3 %
Lymphs Abs: 0.3 10*3/uL — ABNORMAL LOW (ref 0.7–4.0)
MCH: 29.7 pg (ref 26.0–34.0)
MCHC: 33.5 g/dL (ref 30.0–36.0)
MCV: 88.8 fL (ref 80.0–100.0)
Monocytes Absolute: 0.5 10*3/uL (ref 0.1–1.0)
Monocytes Relative: 5 %
Neutro Abs: 8.8 10*3/uL — ABNORMAL HIGH (ref 1.7–7.7)
Neutrophils Relative %: 92 %
Platelets: 176 10*3/uL (ref 150–400)
RBC: 4.54 MIL/uL (ref 4.22–5.81)
RDW: 12.5 % (ref 11.5–15.5)
WBC: 9.6 10*3/uL (ref 4.0–10.5)
nRBC: 0 % (ref 0.0–0.2)

## 2022-06-05 LAB — RESP PANEL BY RT-PCR (RSV, FLU A&B, COVID)  RVPGX2
Influenza A by PCR: POSITIVE — AB
Influenza B by PCR: NEGATIVE
Resp Syncytial Virus by PCR: NEGATIVE
SARS Coronavirus 2 by RT PCR: NEGATIVE

## 2022-06-05 LAB — COMPREHENSIVE METABOLIC PANEL
ALT: 14 U/L (ref 0–44)
AST: 17 U/L (ref 15–41)
Albumin: 4.4 g/dL (ref 3.5–5.0)
Alkaline Phosphatase: 54 U/L (ref 38–126)
Anion gap: 7 (ref 5–15)
BUN: 15 mg/dL (ref 6–20)
CO2: 24 mmol/L (ref 22–32)
Calcium: 8.8 mg/dL — ABNORMAL LOW (ref 8.9–10.3)
Chloride: 102 mmol/L (ref 98–111)
Creatinine, Ser: 0.95 mg/dL (ref 0.61–1.24)
GFR, Estimated: >60 mL/min (ref >60)
Glucose, Bld: 104 mg/dL — ABNORMAL HIGH (ref 70–99)
Potassium: 3.6 mmol/L (ref 3.5–5.1)
Sodium: 133 mmol/L — ABNORMAL LOW (ref 135–145)
Total Bilirubin: 0.9 mg/dL (ref 0.3–1.2)
Total Protein: 7.2 g/dL (ref 6.5–8.1)

## 2022-06-05 LAB — LIPASE, BLOOD: Lipase: 32 U/L (ref 11–51)

## 2022-06-05 MED ORDER — ONDANSETRON HCL 4 MG/2ML IJ SOLN
4.0000 mg | Freq: Once | INTRAMUSCULAR | Status: AC
  Administered 2022-06-05: 4 mg via INTRAVENOUS
  Filled 2022-06-05: qty 2

## 2022-06-05 MED ORDER — ONDANSETRON HCL 4 MG PO TABS: 4.0000 mg | ORAL_TABLET | Freq: Four times a day (QID) | ORAL | 0 refills | Status: DC

## 2022-06-05 MED ORDER — SODIUM CHLORIDE 0.9 % IV BOLUS
1000.0000 mL | Freq: Once | INTRAVENOUS | Status: AC
  Administered 2022-06-05: 1000 mL via INTRAVENOUS

## 2022-06-05 NOTE — Discharge Instructions (Signed)
Return for any problem.  ?

## 2022-06-05 NOTE — ED Provider Notes (Signed)
[place] DEPT Provider Note   CSN: [id number] Arrival date & time: 06/05/22  0907     History  Chief Complaint  Patient presents with   Weakness   Emesis    Franklin [name] is a 42 y.o. male.  42 year old male with prior medical history as detailed below presents for evaluation.  Patient reports onset of subjective fever and chills, nausea x 24 hours.  Patient started vomiting this morning.  Patient reports that he was vomiting while at work and decided to come to the ER for evaluation.  He denies pain.  He denies objective fever.  He denies diarrhea.  The history is provided by the patient and medical records.       Home Medications Prior to Admission medications   Medication Sig Start Date End Date Taking? Authorizing Provider  colchicine 0.6 MG tablet Take 2 tabs po x 1, and then take 1 tab 1 hr later 02/15/22   Rondel Oh, MD  cyclobenzaprine (FLEXERIL) 10 MG tablet Take 1 tablet (10 mg total) by mouth 2 (two) times daily as needed for muscle spasms. 02/20/22   Kemper Durie, DO  lamoTRIgine (LAMICTAL) 100 MG tablet Take 100 mg by mouth daily.    [provider]  lamoTRIgine (LAMICTAL) 25 MG tablet Take 1 tablet (25 mg total) by mouth 2 (two) times daily. 03/28/22   Palumbo, April, MD  lidocaine (LIDODERM) 5 % Place 1 patch onto the skin daily. Remove & Discard patch within 12 hours or as directed by MD 02/20/22   Leanord Asal K, DO  Lumateperone Tosylate (CAPLYTA) 42 MG CAPS Take 42 mg by mouth daily. Patient not taking: Reported on 09/10/2020    [provider]  methocarbamol (ROBAXIN) 500 MG tablet Take 1 tablet (500 mg total) by mouth 2 (two) times daily. 03/29/22   Prosperi, Christian H, PA-C  ondansetron (ZOFRAN) 4 MG tablet Take 1 tablet (4 mg total) by mouth every 6 (six) hours. 03/29/22   Prosperi, Christian H, PA-C  predniSONE (DELTASONE) 20 MG tablet Take 2 tablets (40 mg total) by mouth daily.  03/29/22   Prosperi, Christian H, PA-C  diphenhydrAMINE (BENADRYL) 25 MG tablet Take 2 tablets (50 mg total) by mouth at bedtime as needed for up to 7 days. 12/29/19 02/15/20  Darr, Edison Nasuti, PA-C  loratadine (CLARITIN) 10 MG tablet Take 1 tablet (10 mg total) by mouth daily. Patient not taking: Reported on 10/11/2019 09/18/19 12/02/19  Charlott Rakes, MD  Oxcarbazepine (TRILEPTAL) 300 MG tablet Take 1/2 tablet twice a day Patient not taking: Reported on 07/08/2019 01/26/18 07/09/19  Argentina Donovan, PA-C      Allergies    Coconut (cocos nucifera), Other, Codeine, Depakote [divalproex sodium], Hydroxyzine, Penicillins, Tape, Keflex [cephalexin], Latex, and Levetiracetam    Review of Systems   Review of Systems  All other systems reviewed and are negative.   Physical Exam Updated Vital Signs BP 126/77 (BP Location: Left Arm)   Pulse 88   Temp 99.5 F (37.5 C) (Oral)   Resp 16   SpO2 100%  Physical Exam Vitals and nursing note reviewed.  Constitutional:      General: He is not in acute distress.    Appearance: Normal appearance. He is well-developed.     Comments: Actively vomiting  HENT:     Head: Normocephalic and atraumatic.  Eyes:     Conjunctiva/sclera: Conjunctivae normal.     Pupils: Pupils are equal, round, and reactive to light.  Cardiovascular:     Rate and Rhythm: Normal rate and regular rhythm.     Heart sounds: Normal heart sounds.  Pulmonary:     Effort: Pulmonary effort is normal. No respiratory distress.     Breath sounds: Normal breath sounds.  Abdominal:     General: There is no distension.     Palpations: Abdomen is soft.     Tenderness: There is no abdominal tenderness.  Musculoskeletal:        General: No deformity. Normal range of motion.     Cervical back: Normal range of motion and neck supple.  Skin:    General: Skin is warm and dry.  Neurological:     General: No focal deficit present.     Mental Status: He is alert and oriented to person, place, and  time.     ED Results / Procedures / Treatments   Labs (all labs ordered are listed, but only abnormal results are displayed) Labs Reviewed  RESP PANEL BY RT-PCR (RSV, FLU A&B, COVID)  RVPGX2 - Abnormal; Notable for the following components:      Result Value   Influenza A by PCR POSITIVE (*)    All other components within normal limits  CBC WITH DIFFERENTIAL/PLATELET - Abnormal; Notable for the following components:   Neutro Abs 8.8 (*)    Lymphs Abs 0.3 (*)    All other components within normal limits  COMPREHENSIVE METABOLIC PANEL - Abnormal; Notable for the following components:   Sodium 133 (*)    Glucose, Bld 104 (*)    Calcium 8.8 (*)    All other components within normal limits  LIPASE, BLOOD    EKG EKG Interpretation  Date/Time:  Saturday June 05 2022 09:41:07 EST Ventricular Rate:  71 PR Interval:  176 QRS Duration: 119 QT Interval:  366 QTC Calculation: 398 R Axis:   -54 Text Interpretation: Sinus rhythm Left anterior fascicular block Confirmed by Dene Gentry [phone]) on 06/05/2022 9:57:44 AM  Radiology No results found.  Procedures Procedures    Medications Ordered in ED Medications  ondansetron (ZOFRAN) injection 4 mg (has no administration in time range)  sodium chloride 0.9 % bolus 1,000 mL (has no administration in time range)    ED Course/ Medical Decision Making/ A&P                           Medical Decision Making Amount and/or Complexity of Data Reviewed Labs: ordered.  Risk Prescription drug management.    Medical Screen Complete  This patient presented to the ED with complaint of nausea, vomiting, fatigue.  This complaint involves an extensive number of treatment options. The initial differential diagnosis includes, but is not limited to, viral syndrome, metabolic abnormality, etc.  This presentation is: Acute, Self-Limited, Previously Undiagnosed, Uncertain Prognosis, Complicated, and Systemic Symptoms  Patient is  presenting with a host of symptoms most consistent with likely viral syndrome.  Patient with nausea and vomiting on arrival.  After IV fluids, antiemetics patient is taking p.o. well and feels improved.  He desires discharge home.  Workup is notable for positive influenza test.  Other screening labs obtained are without significant abnormality. Importance of close follow-up is stressed.  Strict return precautions given and understood.   Additional history obtained: External records from outside sources obtained and reviewed including prior ED visits and prior Inpatient records.    Lab Tests:  I ordered and personally interpreted labs.  The pertinent results include:  CBC, CMP, lipase, COVID, flu   Cardiac Monitoring:  The patient was maintained on a cardiac monitor.  I personally viewed and interpreted the cardiac monitor which showed an underlying rhythm of: NSR   Medicines ordered:  I ordered medication including IV fluids, Zofran for dehydration, nausea Reevaluation of the patient after these medicines showed that the patient: improved   Problem List / ED Course:  Nausea, vomiting, acute influenza infection   Reevaluation:  After the interventions noted above, I reevaluated the patient and found that they have: improved  Disposition:  After consideration of the diagnostic results and the patients response to treatment, I feel that the patent would benefit from close outpatient follow-up.          Final Clinical Impression(s) / ED Diagnoses Final diagnoses:  Influenza  Nausea and vomiting, unspecified vomiting type    Rx / DC Orders ED Discharge Orders          Ordered    ondansetron (ZOFRAN) 4 MG tablet  Every 6 hours        06/05/22 1249              Valarie Merino, MD 06/05/22 1252

## 2022-06-05 NOTE — ED Triage Notes (Signed)
Pt arrived via POV, c/o weakness, dizziness and vomiting that started this morning. Denies any diarrhea, fever, chills or any other sx.

## 2022-07-10 ENCOUNTER — Emergency Department (HOSPITAL_BASED_OUTPATIENT_CLINIC_OR_DEPARTMENT_OTHER): Payer: 59 | Admitting: Radiology

## 2022-07-10 ENCOUNTER — Other Ambulatory Visit: Payer: Self-pay

## 2022-07-10 ENCOUNTER — Emergency Department (HOSPITAL_BASED_OUTPATIENT_CLINIC_OR_DEPARTMENT_OTHER)
Admission: EM | Admit: 2022-07-10 | Discharge: 2022-07-10 | Discharge status: Against Medical Advice | Payer: 59 | Attending: Emergency Medicine | Admitting: Emergency Medicine

## 2022-07-10 DIAGNOSIS — R058 Other specified cough: Secondary | ICD-10-CM | POA: Insufficient documentation

## 2022-07-10 DIAGNOSIS — R059 Cough, unspecified: Secondary | ICD-10-CM | POA: Insufficient documentation

## 2022-07-10 DIAGNOSIS — Z1152 Encounter for screening for COVID-19: Secondary | ICD-10-CM | POA: Diagnosis not present

## 2022-07-10 DIAGNOSIS — Z5321 Procedure and treatment not carried out due to patient leaving prior to being seen by health care provider: Secondary | ICD-10-CM | POA: Diagnosis not present

## 2022-07-10 LAB — RESP PANEL BY RT-PCR (RSV, FLU A&B, COVID)  RVPGX2
Influenza A by PCR: NEGATIVE
Influenza B by PCR: NEGATIVE
Resp Syncytial Virus by PCR: NEGATIVE
SARS Coronavirus 2 by RT PCR: NEGATIVE

## 2022-07-10 NOTE — ED Triage Notes (Signed)
Pt from home c/o constant dry cough for the last 3 to 4 days. Pt stated that he went to PCP on 2/6 for same. Pt has been taking OTC robitussin.

## 2022-09-27 ENCOUNTER — Emergency Department (HOSPITAL_COMMUNITY)
Admission: EM | Admit: 2022-09-27 | Discharge: 2022-09-27 | Disposition: A | Discharge status: Home / Self Care | Payer: 59 | Attending: Emergency Medicine | Admitting: Emergency Medicine

## 2022-09-27 ENCOUNTER — Encounter (HOSPITAL_COMMUNITY): Payer: Self-pay

## 2022-09-27 DIAGNOSIS — J029 Acute pharyngitis, unspecified: Secondary | ICD-10-CM | POA: Diagnosis present

## 2022-09-27 DIAGNOSIS — R0981 Nasal congestion: Secondary | ICD-10-CM | POA: Diagnosis not present

## 2022-09-27 DIAGNOSIS — R059 Cough, unspecified: Secondary | ICD-10-CM | POA: Diagnosis not present

## 2022-09-27 DIAGNOSIS — Z1152 Encounter for screening for COVID-19: Secondary | ICD-10-CM | POA: Insufficient documentation

## 2022-09-27 DIAGNOSIS — Z91040 Latex allergy status: Secondary | ICD-10-CM | POA: Insufficient documentation

## 2022-09-27 LAB — SARS CORONAVIRUS 2 BY RT PCR: SARS Coronavirus 2 by RT PCR: NEGATIVE

## 2022-09-27 LAB — GROUP A STREP BY PCR: Group A Strep by PCR: NOT DETECTED

## 2022-09-27 NOTE — Discharge Instructions (Addendum)
Take Tylenol Motrin as needed for pain.  Drink plenty of fluids.  You likely a viral pharyngitis, your strep and COVID test both came back negative.  This should pass on its own within the next week, if your symptoms are still here at the end of the week follow-up with your primary.

## 2022-09-27 NOTE — ED Provider Notes (Signed)
[place] EMERGENCY DEPARTMENT AT [facility] Provider Note   CSN: [id number] Arrival date & time: 09/27/22  1550     History  Chief Complaint  Patient presents with   Sore Throat    Franklin [name] is a 42 y.o. male.   Sore Throat     Patient presents for sore throat.  Symptoms started earlier today.  Also has nonproductive cough, nasal congestion.  No known sick contact, no medicine prior to arrival.  Home Medications Prior to Admission medications   Medication Sig Start Date End Date Taking? Authorizing Provider  colchicine 0.6 MG tablet Take 2 tabs po x 1, and then take 1 tab 1 hr later 02/15/22   Jannifer Franklin, MD  cyclobenzaprine (FLEXERIL) 10 MG tablet Take 1 tablet (10 mg total) by mouth 2 (two) times daily as needed for muscle spasms. 02/20/22   Rexford Maus, DO  lamoTRIgine (LAMICTAL) 100 MG tablet Take 100 mg by mouth daily.    [provider]  lamoTRIgine (LAMICTAL) 25 MG tablet Take 1 tablet (25 mg total) by mouth 2 (two) times daily. 03/28/22   Palumbo, April, MD  lidocaine (LIDODERM) 5 % Place 1 patch onto the skin daily. Remove & Discard patch within 12 hours or as directed by MD 02/20/22   Elayne Snare K, DO  Lumateperone Tosylate (CAPLYTA) 42 MG CAPS Take 42 mg by mouth daily. Patient not taking: Reported on 09/10/2020    [provider]  methocarbamol (ROBAXIN) 500 MG tablet Take 1 tablet (500 mg total) by mouth 2 (two) times daily. 03/29/22   Prosperi, Christian H, PA-C  ondansetron (ZOFRAN) 4 MG tablet Take 1 tablet (4 mg total) by mouth every 6 (six) hours. 03/29/22   Prosperi, Christian H, PA-C  ondansetron (ZOFRAN) 4 MG tablet Take 1 tablet (4 mg total) by mouth every 6 (six) hours. 06/05/22   Wynetta Fines, MD  predniSONE (DELTASONE) 20 MG tablet Take 2 tablets (40 mg total) by mouth daily. 03/29/22   Prosperi, Christian H, PA-C  diphenhydrAMINE (BENADRYL) 25 MG tablet Take 2 tablets (50 mg total) by mouth at  bedtime as needed for up to 7 days. 12/29/19 02/15/20  Darr, Gerilyn Pilgrim, PA-C  loratadine (CLARITIN) 10 MG tablet Take 1 tablet (10 mg total) by mouth daily. Patient not taking: Reported on 10/11/2019 09/18/19 12/02/19  Hoy Register, MD  Oxcarbazepine (TRILEPTAL) 300 MG tablet Take 1/2 tablet twice a day Patient not taking: Reported on 07/08/2019 01/26/18 07/09/19  Anders Simmonds, PA-C      Allergies    Coconut (cocos nucifera), Other, Codeine, Depakote [divalproex sodium], Hydroxyzine, Penicillins, Tape, Keflex [cephalexin], Latex, and Levetiracetam    Review of Systems   Review of Systems  Physical Exam Updated Vital Signs BP (!) 132/92 (BP Location: Right Arm)   Pulse 64   Temp 99.3 F (37.4 C) (Oral)   Resp 16   SpO2 99%  Physical Exam Vitals and nursing note reviewed. Exam conducted with a chaperone present.  Constitutional:      Appearance: Normal appearance.  HENT:     Head: Normocephalic and atraumatic.     Mouth/Throat:     Pharynx: Uvula midline. Posterior oropharyngeal erythema present.  Eyes:     General: No scleral icterus.       Right eye: No discharge.        Left eye: No discharge.     Extraocular Movements: Extraocular movements intact.     Pupils: Pupils are equal,  round, and reactive to light.  Cardiovascular:     Rate and Rhythm: Normal rate and regular rhythm.     Pulses: Normal pulses.     Heart sounds: Normal heart sounds. No murmur heard.    No friction rub. No gallop.  Pulmonary:     Effort: Pulmonary effort is normal. No respiratory distress.     Breath sounds: Normal breath sounds.  Abdominal:     General: Abdomen is flat. Bowel sounds are normal. There is no distension.     Palpations: Abdomen is soft.     Tenderness: There is no abdominal tenderness.  Skin:    General: Skin is warm and dry.     Coloration: Skin is not jaundiced.  Neurological:     Mental Status: He is alert. Mental status is at baseline.     Coordination: Coordination normal.      ED Results / Procedures / Treatments   Labs (all labs ordered are listed, but only abnormal results are displayed) Labs Reviewed  SARS CORONAVIRUS 2 BY RT PCR  GROUP A STREP BY PCR    EKG None  Radiology No results found.  Procedures Procedures    Medications Ordered in ED Medications - No data to display  ED Course/ Medical Decision Making/ A&P                             Medical Decision Making  Patient is a 42 year old male presenting today due to sore throat.  Will start with strep and COVID test, physical exam he does not have any indication of peritonsillar abscess, Ludwig angina or retropharyngeal abscess.  Clinically no SIRS criteria not septic.  Strep and COVID are negative, I suspect viral pharyngitis.  Will discharge patient home with supportive care.        Final Clinical Impression(s) / ED Diagnoses Final diagnoses:  Viral pharyngitis    Rx / DC Orders ED Discharge Orders     None         Theron Arista, Cordelia Poche 09/27/22 2138    Linwood Dibbles, MD 09/28/22 [phone]

## 2022-09-27 NOTE — ED Triage Notes (Signed)
Pt BIBA from home today c/o flu like symptoms x 24 hours. Pt endorses sore throat, nasal congestion, cough. Denies N/V/D.

## 2022-09-30 ENCOUNTER — Ambulatory Visit: Payer: 59 | Admitting: Podiatry

## 2022-10-07 ENCOUNTER — Ambulatory Visit: Payer: 59 | Admitting: Podiatry

## 2022-11-04 ENCOUNTER — Ambulatory Visit: Payer: 59 | Admitting: Podiatry

## 2022-11-11 ENCOUNTER — Ambulatory Visit (INDEPENDENT_AMBULATORY_CARE_PROVIDER_SITE_OTHER): Payer: 59 | Admitting: Podiatry

## 2022-11-11 DIAGNOSIS — M7752 Other enthesopathy of left foot: Secondary | ICD-10-CM | POA: Diagnosis not present

## 2022-11-11 DIAGNOSIS — M722 Plantar fascial fibromatosis: Secondary | ICD-10-CM

## 2022-11-11 DIAGNOSIS — G5752 Tarsal tunnel syndrome, left lower limb: Secondary | ICD-10-CM | POA: Diagnosis not present

## 2022-11-11 DIAGNOSIS — G5792 Unspecified mononeuropathy of left lower limb: Secondary | ICD-10-CM

## 2022-11-11 MED ORDER — MELOXICAM 15 MG PO TABS: 15.0000 mg | ORAL_TABLET | Freq: Every day | ORAL | 0 refills | Status: DC

## 2022-11-11 MED ORDER — METHYLPREDNISOLONE 4 MG PO TBPK: ORAL_TABLET | ORAL | 0 refills | Status: DC

## 2022-11-11 NOTE — Progress Notes (Signed)
Subjective:  Patient ID: Franklin [name], male    DOB: March 23, 1981,  MRN: [id number]  Chief Complaint  Patient presents with   Numbness    Rm 21  Left bottom foot numbness x 1 week. Pt states there are no other symptoms or an injury to his foot. Pt is not diabetic. No current A1c testing.     42 y.o. male presents with concern for plantar foot numbness and pain in the left foot.  He also has pain in the bottom of the left arch.  There is pain focally in the midfoot along the arch.  He says that he has been trying to offload and has limping because of this denies any history of diabetes.  Past Medical History:  Diagnosis Date   Anxiety    Bipolar 1 disorder (HCC)    Chronic headaches    Conversion disorder with seizures or convulsions    Convulsion, non-epileptic (HCC)    "raises the possibility of non-epileptic events" per Neuro MD office note 03/2015   Depression    Dizziness    Hx of electroencephalogram 12/2014   normal   Insomnia    Mild cognitive impairment    Psychogenic nonepileptic seizure    Schizophrenia (HCC)    Schizophrenic disorder (HCC)    Seizure-like activity (HCC)    Seizures (HCC)     Allergies  Allergen Reactions   Coconut (Cocos Nucifera) Anaphylaxis and Nausea And Vomiting    Reaction to any kind of coconut   Other Anaphylaxis    Raisins   Codeine Nausea And Vomiting   Depakote [Divalproex Sodium] Other (See Comments)    Pt states that this medication makes him feel like he is "out of it"   Hydroxyzine Other (See Comments)    Other reaction(s): Dizziness (intolerance) "passed out"   Penicillins Nausea And Vomiting    Has patient had a PCN reaction causing immediate rash, facial/tongue/throat swelling, SOB or lightheadedness with hypotension: No Has patient had a PCN reaction causing severe rash involving mucus membranes or skin necrosis: No Has patient had a PCN reaction that required hospitalization No Has patient had a PCN reaction occurring  within the last 10 years: No If all of the above answers are "NO", then may proceed with Cephalosporin use.     Tape Other (See Comments)    TEARS THE SKIN; only "paper tape" is tolerated   Keflex [Cephalexin] Itching   Latex Rash   Levetiracetam Nausea And Vomiting    ROS: Negative except as per HPI above  Objective:  General: AAO x3, NAD  Dermatological: With inspection and palpation of the right and left lower extremities there are no open sores, no preulcerative lesions, no rash or signs of infection present. Nails are of normal length thickness and coloration.   Vascular:  Dorsalis Pedis artery and Posterior Tibial artery pedal pulses are 2/4 bilateral.  Capillary fill time < 3 sec to all digits.   Neruologic: Fully sensory intact to light touch along the plantar foot at this time  Musculoskeletal: Focal pain with palpation along the plantar midfoot along the mid arch along medial band plantar fascia.  Gait: Unassisted, Nonantalgic.   No images are attached to the encounter.  Radiographs:  Deferred Assessment:   1. Plantar fasciitis, left   2. Tendinitis of left foot   3. Tarsal tunnel syndrome, left   4. Neuropathy of left foot      Plan:  Patient was evaluated and treated and all questions answered.  #  Planter fasciitis of the left midfoot versus tarsal tunnel syndrome -Discussed with patient he does have pain focally along the plantar fascial medial band of the midfoot of the arch -Recommend we treat with anti-inflammatory therapies including methylprednisolone 4 mg steroid taper pack take as directed for 6 days. -Also recommend meloxicam 50 mg take once daily for the next 30 days. -I recommended a steroid injection at this visit however the patient did not want this and we will defer until further visit if no improvement -Recommend he has good supportive shoes and inserts for his shoes to prevent inflammation along the plantar fascial band -Less likely concern  for tarsal tunnel syndrome or neuropathy however cannot rule out this time will consider neurologic testing in the future if no improvement or numbness persists   Return in about 4 weeks (around 12/09/2022) for f/u L foot pain and numbness.          Corinna Gab, DPM [facility] / [facility]

## 2022-12-16 ENCOUNTER — Ambulatory Visit (INDEPENDENT_AMBULATORY_CARE_PROVIDER_SITE_OTHER): Payer: 59 | Admitting: Podiatry

## 2022-12-16 ENCOUNTER — Encounter: Payer: Self-pay | Admitting: Podiatry

## 2022-12-16 VITALS — BP 136/81 | HR 61

## 2022-12-16 DIAGNOSIS — G5792 Unspecified mononeuropathy of left lower limb: Secondary | ICD-10-CM

## 2022-12-16 DIAGNOSIS — G5752 Tarsal tunnel syndrome, left lower limb: Secondary | ICD-10-CM

## 2022-12-16 DIAGNOSIS — M722 Plantar fascial fibromatosis: Secondary | ICD-10-CM | POA: Diagnosis not present

## 2022-12-16 DIAGNOSIS — M7752 Other enthesopathy of left foot: Secondary | ICD-10-CM

## 2022-12-16 MED ORDER — GABAPENTIN 300 MG PO CAPS: 300.0000 mg | ORAL_CAPSULE | Freq: Two times a day (BID) | ORAL | 0 refills | Status: DC

## 2022-12-16 MED ORDER — METHYLPREDNISOLONE 4 MG PO TBPK: ORAL_TABLET | ORAL | 0 refills | Status: DC

## 2022-12-16 MED ORDER — MELOXICAM 15 MG PO TABS: 15.0000 mg | ORAL_TABLET | Freq: Every day | ORAL | 0 refills | Status: DC

## 2022-12-16 NOTE — Progress Notes (Signed)
Subjective:  Patient ID: Franklin [name], male    DOB: [birth year],  MRN: [id number]  Chief Complaint  Patient presents with   Numbness    "It's so, so.  It's still numb."    42 y.o. male presents for follow-up plantar foot numbness and pain in the left foot.  He has pain about the bottom of the arch.  He says it is slightly better than last time after being given a steroid Dosepak and meloxicam 15 mg.  Also still reporting numbness along the foot.  Past Medical History:  Diagnosis Date   Anxiety    Bipolar 1 disorder (HCC)    Chronic headaches    Conversion disorder with seizures or convulsions    Convulsion, non-epileptic (HCC)    "raises the possibility of non-epileptic events" per Neuro MD office note 03/2015   Depression    Dizziness    Hx of electroencephalogram 12/2014   normal   Insomnia    Mild cognitive impairment    Psychogenic nonepileptic seizure    Schizophrenia (HCC)    Schizophrenic disorder (HCC)    Seizure-like activity (HCC)    Seizures (HCC)     Allergies  Allergen Reactions   Coconut (Cocos Nucifera) Anaphylaxis and Nausea And Vomiting    Reaction to any kind of coconut   Other Anaphylaxis    Raisins   Codeine Nausea And Vomiting   Depakote [Divalproex Sodium] Other (See Comments)    Pt states that this medication makes him feel like he is "out of it"   Hydroxyzine Other (See Comments)    Other reaction(s): Dizziness (intolerance) "passed out"   Penicillins Nausea And Vomiting    Has patient had a PCN reaction causing immediate rash, facial/tongue/throat swelling, SOB or lightheadedness with hypotension: No Has patient had a PCN reaction causing severe rash involving mucus membranes or skin necrosis: No Has patient had a PCN reaction that required hospitalization No Has patient had a PCN reaction occurring within the last 10 years: No If all of the above answers are "NO", then may proceed with Cephalosporin use.     Tape Other (See Comments)     TEARS THE SKIN; only "paper tape" is tolerated   Keflex [Cephalexin] Itching   Latex Rash   Levetiracetam Nausea And Vomiting    ROS: Negative except as per HPI above  Objective:  General: AAO x3, NAD  Dermatological: With inspection and palpation of the right and left lower extremities there are no open sores, no preulcerative lesions, no rash or signs of infection present. Nails are of normal length thickness and coloration.   Vascular:  Dorsalis Pedis artery and Posterior Tibial artery pedal pulses are 2/4 bilateral.  Capillary fill time < 3 sec to all digits.   Neruologic: Fully sensory intact to light touch along the plantar foot at this time  Musculoskeletal: Focal pain with palpation along the plantar midfoot along the mid arch along medial band plantar fascia.  Gait: Unassisted, Nonantalgic.   No images are attached to the encounter.  Radiographs:  Deferred Assessment:   1. Plantar fasciitis, left   2. Tendinitis of left foot   3. Tarsal tunnel syndrome, left   4. Neuropathy of left foot       Plan:  Patient was evaluated and treated and all questions answered.  # Planter fasciitis of the left midfoot versus tarsal tunnel syndrome -Somewhat improved from prior, will continue current treatment therapies -Discussed with patient he does have pain focally along the  plantar fascial medial band of the midfoot of the arch -Recommend we treat with anti-inflammatory therapies including methylprednisolone 4 mg steroid taper pack take as directed for 6 days. -Also recommend meloxicam 15 mg take once daily for the next 30 days. -I recommended a steroid injection at this visit however the patient did not want this and we will defer until further visit if no improvement -Recommend he has good supportive shoes and inserts for his shoes to prevent inflammation along the plantar fascial band -Recommend power step orthotics and these were dispensed patient at this visit  #  Neuropathy of left foot -eRx for gabapentin 300 mg twice daily for neuropathy -Discussed risk and benefits of gabapentin with patient  Return in about 6 weeks (around 01/27/2023) for f/u neuropathy, left foot pain.          Corinna Gab, DPM [facility] / [facility]

## 2023-02-16 ENCOUNTER — Ambulatory Visit (INDEPENDENT_AMBULATORY_CARE_PROVIDER_SITE_OTHER): Payer: 59 | Admitting: Podiatry

## 2023-02-16 DIAGNOSIS — G5792 Unspecified mononeuropathy of left lower limb: Secondary | ICD-10-CM

## 2023-02-16 DIAGNOSIS — M5416 Radiculopathy, lumbar region: Secondary | ICD-10-CM | POA: Diagnosis not present

## 2023-02-16 DIAGNOSIS — M722 Plantar fascial fibromatosis: Secondary | ICD-10-CM

## 2023-02-16 MED ORDER — METHYLPREDNISOLONE 4 MG PO TBPK: ORAL_TABLET | ORAL | 0 refills | Status: DC

## 2023-02-16 MED ORDER — MELOXICAM 15 MG PO TABS: 15.0000 mg | ORAL_TABLET | Freq: Every day | ORAL | 0 refills | Status: DC

## 2023-02-16 NOTE — Progress Notes (Signed)
Subjective:  Patient ID: Franklin [name], male    DOB: 10-Jun-1980,  MRN: [id number]  Chief Complaint  Patient presents with   Foot Pain    Left forefoot/arch pain, plantar fasciitis     42 y.o. male presents for new concern of similar pains this time in the right foot. Relates pain in the arch but also up his leg and some lower back pains.   He has pain about the bottom of the arch.  Relates steroid pack helped in the past. Also still reporting numbness along the foot.  Past Medical History:  Diagnosis Date   Anxiety    Bipolar 1 disorder (HCC)    Chronic headaches    Conversion disorder with seizures or convulsions    Convulsion, non-epileptic (HCC)    "raises the possibility of non-epileptic events" per Neuro MD office note 03/2015   Depression    Dizziness    Hx of electroencephalogram 12/2014   normal   Insomnia    Mild cognitive impairment    Psychogenic nonepileptic seizure    Schizophrenia (HCC)    Schizophrenic disorder (HCC)    Seizure-like activity (HCC)    Seizures (HCC)     Allergies  Allergen Reactions   Coconut (Cocos Nucifera) Anaphylaxis and Nausea And Vomiting    Reaction to any kind of coconut   Other Anaphylaxis    Raisins   Codeine Nausea And Vomiting   Depakote [Divalproex Sodium] Other (See Comments)    Pt states that this medication makes him feel like he is "out of it"   Hydroxyzine Other (See Comments)    Other reaction(s): Dizziness (intolerance) "passed out"   Penicillins Nausea And Vomiting    Has patient had a PCN reaction causing immediate rash, facial/tongue/throat swelling, SOB or lightheadedness with hypotension: No Has patient had a PCN reaction causing severe rash involving mucus membranes or skin necrosis: No Has patient had a PCN reaction that required hospitalization No Has patient had a PCN reaction occurring within the last 10 years: No If all of the above answers are "NO", then may proceed with Cephalosporin use.     Tape  Other (See Comments)    TEARS THE SKIN; only "paper tape" is tolerated   Keflex [Cephalexin] Itching   Latex Rash   Levetiracetam Nausea And Vomiting    ROS: Negative except as per HPI above  Objective:  General: AAO x3, NAD  Dermatological: With inspection and palpation of the right and left lower extremities there are no open sores, no preulcerative lesions, no rash or signs of infection present. Nails are of normal length thickness and coloration.   Vascular:  Dorsalis Pedis artery and Posterior Tibial artery pedal pulses are 2/4 bilateral.  Capillary fill time < 3 sec to all digits.   Neruologic: Fully sensory intact to light touch along the plantar foot at this time  Musculoskeletal: Focal pain with palpation along the plantar midfoot along the mid arch along medial band plantar fascia.  Gait: Unassisted, Nonantalgic.   No images are attached to the encounter.  Radiographs:  Deferred Assessment:   1. Plantar fasciitis, right   2. Lumbar radiculopathy   3. Neuropathy of left foot        Plan:  Patient was evaluated and treated and all questions answered.  # Planter fasciitis of the left midfoot versus tarsal tunnel syndrome -Somewhat improved from prior, will continue current treatment therapies -Discussed with patient he does have pain focally along the plantar fascial medial band  of the midfoot of the arch -Recommend we treat with anti-inflammatory therapies including methylprednisolone 4 mg steroid taper pack take as directed for 6 days. -Also recommend meloxicam 15 mg take once daily for the next 30 days. -Discussed some pain could be coming from his back and would recommend getting this evaluated.  -Recommend he has good supportive shoes and inserts for his shoes to prevent inflammation along the plantar fascial band -Continue powersteps.     No follow-ups on file.

## 2023-02-16 NOTE — Patient Instructions (Signed)

## 2023-04-22 ENCOUNTER — Encounter (HOSPITAL_COMMUNITY): Payer: Self-pay

## 2023-04-22 ENCOUNTER — Other Ambulatory Visit: Payer: Self-pay

## 2023-04-22 ENCOUNTER — Emergency Department (HOSPITAL_COMMUNITY)
Admission: EM | Admit: 2023-04-22 | Discharge: 2023-04-22 | Disposition: A | Discharge status: Home / Self Care | Payer: 59 | Attending: Emergency Medicine | Admitting: Emergency Medicine

## 2023-04-22 ENCOUNTER — Emergency Department (HOSPITAL_COMMUNITY): Payer: 59

## 2023-04-22 DIAGNOSIS — R569 Unspecified convulsions: Secondary | ICD-10-CM | POA: Insufficient documentation

## 2023-04-22 DIAGNOSIS — Z91040 Latex allergy status: Secondary | ICD-10-CM | POA: Insufficient documentation

## 2023-04-22 MED ORDER — ACETAMINOPHEN 325 MG PO TABS
650.0000 mg | ORAL_TABLET | Freq: Once | ORAL | Status: AC
  Administered 2023-04-22: 650 mg via ORAL
  Filled 2023-04-22: qty 2

## 2023-04-22 MED ORDER — LAMOTRIGINE 25 MG PO TABS
100.0000 mg | ORAL_TABLET | Freq: Once | ORAL | Status: AC
  Administered 2023-04-22: 100 mg via ORAL
  Filled 2023-04-22: qty 4

## 2023-04-22 MED ORDER — METOCLOPRAMIDE HCL 10 MG PO TABS
10.0000 mg | ORAL_TABLET | Freq: Once | ORAL | Status: AC
  Administered 2023-04-22: 10 mg via ORAL
  Filled 2023-04-22: qty 1

## 2023-04-22 MED ORDER — DIPHENHYDRAMINE HCL 50 MG/ML IJ SOLN
25.0000 mg | Freq: Once | INTRAMUSCULAR | Status: DC
  Filled 2023-04-22: qty 1

## 2023-04-22 MED ORDER — LAMOTRIGINE 25 MG PO TABS: ORAL_TABLET | ORAL | 0 refills | Status: DC

## 2023-04-22 MED ORDER — METOCLOPRAMIDE HCL 5 MG/ML IJ SOLN
10.0000 mg | Freq: Once | INTRAMUSCULAR | Status: DC
  Filled 2023-04-22: qty 2

## 2023-04-22 MED ORDER — SODIUM CHLORIDE 0.9 % IV BOLUS: 1000.0000 mL | Freq: Once | INTRAVENOUS | Status: DC

## 2023-04-22 NOTE — ED Notes (Signed)
Pt refused blood draw, nurse aware.

## 2023-04-22 NOTE — Discharge Instructions (Addendum)
As we discussed, you may have seizure disorder.  I have ordered Lamictal and you need to take 25 mg daily for 2 weeks then 25 mg twice daily for 2 weeks and you need to follow-up with neurology within the next week or so.  Return to ER if you have another seizure or lethargy
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
## 2023-04-22 NOTE — ED Notes (Signed)
When this RN entered the room to introduce myself and talk with the pt about getting an IV started and draw blood and administer some medicines. The pt tells me he does not want and IV or blood work. The pt states, "I thought he was going to give me something for my headache", to which this RN informed him that the IV is needed in order to give the two medicines ordered for his headache. The pt tells me he does not want it because he does not want an IV. MD informed. Pt did take the oral Lamictal that was ordered.
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
## 2023-05-06 ENCOUNTER — Other Ambulatory Visit: Payer: Self-pay

## 2023-05-06 ENCOUNTER — Emergency Department (HOSPITAL_COMMUNITY): Payer: 59

## 2023-05-06 ENCOUNTER — Encounter (HOSPITAL_COMMUNITY): Payer: Self-pay | Admitting: Emergency Medicine

## 2023-05-06 ENCOUNTER — Emergency Department (HOSPITAL_COMMUNITY)
Admission: EM | Admit: 2023-05-06 | Discharge: 2023-05-06 | Discharge status: Against Medical Advice | Payer: 59 | Attending: Emergency Medicine | Admitting: Emergency Medicine

## 2023-05-06 DIAGNOSIS — R569 Unspecified convulsions: Secondary | ICD-10-CM | POA: Diagnosis present

## 2023-05-06 DIAGNOSIS — G40909 Epilepsy, unspecified, not intractable, without status epilepticus: Secondary | ICD-10-CM | POA: Insufficient documentation

## 2023-05-06 DIAGNOSIS — R059 Cough, unspecified: Secondary | ICD-10-CM | POA: Insufficient documentation

## 2023-05-06 DIAGNOSIS — Z91040 Latex allergy status: Secondary | ICD-10-CM | POA: Insufficient documentation

## 2023-05-06 DIAGNOSIS — R519 Headache, unspecified: Secondary | ICD-10-CM | POA: Insufficient documentation

## 2023-05-06 DIAGNOSIS — Z20822 Contact with and (suspected) exposure to covid-19: Secondary | ICD-10-CM | POA: Insufficient documentation

## 2023-05-06 LAB — CBC WITH DIFFERENTIAL/PLATELET
Abs Immature Granulocytes: 0.01 10*3/uL (ref 0.00–0.07)
Basophils Absolute: 0 10*3/uL (ref 0.0–0.1)
Basophils Relative: 1 %
Eosinophils Absolute: 0.1 10*3/uL (ref 0.0–0.5)
Eosinophils Relative: 1 %
HCT: 43.3 % (ref 39.0–52.0)
Hemoglobin: 14.5 g/dL (ref 13.0–17.0)
Immature Granulocytes: 0 %
Lymphocytes Relative: 26 %
Lymphs Abs: 1.3 10*3/uL (ref 0.7–4.0)
MCH: 29 pg (ref 26.0–34.0)
MCHC: 33.5 g/dL (ref 30.0–36.0)
MCV: 86.6 fL (ref 80.0–100.0)
Monocytes Absolute: 0.5 10*3/uL (ref 0.1–1.0)
Monocytes Relative: 10 %
Neutro Abs: 3.1 10*3/uL (ref 1.7–7.7)
Neutrophils Relative %: 62 %
Platelets: 208 10*3/uL (ref 150–400)
RBC: 5 MIL/uL (ref 4.22–5.81)
RDW: 12.1 % (ref 11.5–15.5)
WBC: 5 10*3/uL (ref 4.0–10.5)
nRBC: 0 % (ref 0.0–0.2)

## 2023-05-06 LAB — URINALYSIS, ROUTINE W REFLEX MICROSCOPIC
Bilirubin Urine: NEGATIVE
Glucose, UA: NEGATIVE mg/dL
Hgb urine dipstick: NEGATIVE
Ketones, ur: NEGATIVE mg/dL
Leukocytes,Ua: NEGATIVE
Nitrite: NEGATIVE
Protein, ur: NEGATIVE mg/dL
Specific Gravity, Urine: 1.01 (ref 1.005–1.030)
pH: 6 (ref 5.0–8.0)

## 2023-05-06 LAB — RAPID URINE DRUG SCREEN, HOSP PERFORMED
Amphetamines: NOT DETECTED
Barbiturates: NOT DETECTED
Benzodiazepines: NOT DETECTED
Cocaine: NOT DETECTED
Opiates: NOT DETECTED
Tetrahydrocannabinol: NOT DETECTED

## 2023-05-06 LAB — COMPREHENSIVE METABOLIC PANEL
ALT: 36 U/L (ref 0–44)
AST: 26 U/L (ref 15–41)
Albumin: 3.9 g/dL (ref 3.5–5.0)
Alkaline Phosphatase: 71 U/L (ref 38–126)
Anion gap: 8 (ref 5–15)
BUN: 7 mg/dL (ref 6–20)
CO2: 24 mmol/L (ref 22–32)
Calcium: 9.3 mg/dL (ref 8.9–10.3)
Chloride: 105 mmol/L (ref 98–111)
Creatinine, Ser: 0.93 mg/dL (ref 0.61–1.24)
GFR, Estimated: >60 mL/min (ref >60)
Glucose, Bld: 104 mg/dL — ABNORMAL HIGH (ref 70–99)
Potassium: 3.5 mmol/L (ref 3.5–5.1)
Sodium: 137 mmol/L (ref 135–145)
Total Bilirubin: 1 mg/dL (ref <1.2)
Total Protein: 7 g/dL (ref 6.5–8.1)

## 2023-05-06 LAB — ETHANOL: Alcohol, Ethyl (B): <10 mg/dL (ref <10)

## 2023-05-06 LAB — RESP PANEL BY RT-PCR (RSV, FLU A&B, COVID)  RVPGX2
Influenza A by PCR: NEGATIVE
Influenza B by PCR: NEGATIVE
Resp Syncytial Virus by PCR: NEGATIVE
SARS Coronavirus 2 by RT PCR: NEGATIVE
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
## 2023-05-06 NOTE — ED Notes (Signed)
Pt refusing EKG. Dr. Manus Gunning aware.

## 2023-05-06 NOTE — Discharge Instructions (Signed)
Your seizure medication as prescribed and follow-up with the neurologist.  Return to the ED with new or worsening symptoms.  Do not drive or operate heavy machinery until cleared by the neurologist.

## 2023-05-06 NOTE — ED Notes (Signed)
Pt now refusing CT scan. Dr. Manus Gunning made aware.
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
## 2023-05-09 LAB — LAMOTRIGINE LEVEL: Lamotrigine Lvl: <1 ug/mL — ABNORMAL LOW (ref 2.0–20.0)

## 2023-05-13 ENCOUNTER — Encounter (HOSPITAL_COMMUNITY): Payer: Self-pay

## 2023-05-13 ENCOUNTER — Ambulatory Visit (HOSPITAL_COMMUNITY)
Admission: EM | Admit: 2023-05-13 | Discharge: 2023-05-13 | Disposition: A | Discharge status: Home / Self Care | Payer: 59 | Attending: Internal Medicine | Admitting: Internal Medicine

## 2023-05-13 DIAGNOSIS — S39012A Strain of muscle, fascia and tendon of lower back, initial encounter: Secondary | ICD-10-CM

## 2023-05-13 MED ORDER — LIDOCAINE 5 % EX PTCH: 1.0000 | MEDICATED_PATCH | CUTANEOUS | 0 refills | Status: DC

## 2023-05-13 NOTE — ED Provider Notes (Signed)
[facility]    CSN: [id number] Arrival date & time: 05/13/23  1846      History   Chief Complaint Chief Complaint  Patient presents with   Back Pain    HPI Franklin [name] is a 42 y.o. male who presents with low back pain since today while at work. He works as Games developer. He was clocked out when he placed a tash bag in the trash can and felt the pain on central mid lumbar region. This is not new to him, the last time was last year and had the same pain. Denies radicular pain or saddle paresthesia.  Has taken his wife's Ibuprofen 800 mg and Robaxen tablet today and has not helped. Lidoderm patches normally help the best.    Past Medical History:  Diagnosis Date   Anxiety    Bipolar 1 disorder (HCC)    Chronic headaches    Conversion disorder with seizures or convulsions    Convulsion, non-epileptic (HCC)    "raises the possibility of non-epileptic events" per Neuro MD office note 03/2015   Depression    Dizziness    Hx of electroencephalogram 12/2014   normal   Insomnia    Mild cognitive impairment    Psychogenic nonepileptic seizure    Schizophrenia (HCC)    Schizophrenic disorder (HCC)    Seizure-like activity (HCC)    Seizures (HCC)     Patient Active Problem List   Diagnosis Date Noted   Migraine without aura and without status migrainosus, not intractable 05/22/2018   Vertigo 05/22/2018   Psychogenic nonepileptic seizure 02/18/2017   Cholelithiasis 01/24/2017   Conversion disorder with seizures or convulsions 03/26/2016   Spells of decreased attentiveness 03/16/2016   Abnormal EKG 11/30/2015   Elevated troponin 11/30/2015   Bipolar disorder (HCC) 03/11/2015   Bipolar affective disorder (HCC) 01/08/2015   Generalized idiopathic epilepsy and epileptic syndromes, without status epilepticus, not intractable (HCC) 11/27/2014   Seizure disorder (HCC) 10/14/2014    History reviewed. No pertinent surgical history.     Home  Medications    Prior to Admission medications   Medication Sig Start Date End Date Taking? Authorizing Provider  lidocaine (LIDODERM) 5 % Place 1 patch onto the skin daily. Remove & Discard patch within 12 hours or as directed by MD 05/13/23  Yes Rodriguez-Southworth, Nettie Elm, PA-C  gabapentin (NEURONTIN) 300 MG capsule Take 1 capsule (300 mg total) by mouth 2 (two) times daily. 12/16/22   Standiford, Jenelle Mages, DPM  lamoTRIgine (LAMICTAL) 25 MG tablet Take 1 tablet (25 mg total) by mouth daily for 14 days, THEN 1 tablet (25 mg total) 2 (two) times daily for 14 days. 04/22/23 05/20/23  Charlynne Pander, MD  diphenhydrAMINE (BENADRYL) 25 MG tablet Take 2 tablets (50 mg total) by mouth at bedtime as needed for up to 7 days. 12/29/19 02/15/20  Darr, Gerilyn Pilgrim, PA-C  loratadine (CLARITIN) 10 MG tablet Take 1 tablet (10 mg total) by mouth daily. Patient not taking: Reported on 10/11/2019 09/18/19 12/02/19  Hoy Register, MD  Oxcarbazepine (TRILEPTAL) 300 MG tablet Take 1/2 tablet twice a day Patient not taking: Reported on 07/08/2019 01/26/18 07/09/19  Anders Simmonds, PA-C    Family History Family History  Problem Relation Age of Onset   Kidney failure Mother    Heart disease Mother     Social History Social History   Tobacco Use   Smoking status: Never   Smokeless tobacco: Never  Vaping Use   Vaping  status: Never Used  Substance Use Topics   Alcohol use: Not Currently    Comment: special occassions   Drug use: Never     Allergies   Coconut (cocos nucifera), Other, Codeine, Depakote [divalproex sodium], Hydroxyzine, Penicillins, Tape, Keflex [cephalexin], Latex, and Levetiracetam   Review of Systems Review of Systems  As noted in HPI Physical Exam Triage Vital Signs ED Triage Vitals  Encounter Vitals Group     BP 05/13/23 1858 (!) 165/82     Systolic BP Percentile --      Diastolic BP Percentile --      Pulse Rate 05/13/23 1858 76     Resp 05/13/23 1858 16     Temp 05/13/23  1858 98.1 F (36.7 C)     Temp Source 05/13/23 1858 Oral     SpO2 05/13/23 1858 94 %     Weight 05/13/23 1858 148 lb (67.1 kg)     Height 05/13/23 1858 6' (1.829 m)     Head Circumference --      Peak Flow --      Pain Score 05/13/23 1857 10     Pain Loc --      Pain Education --      Exclude from Growth Chart --    No data found.  Updated Vital Signs BP (!) 165/82 (BP Location: Right Arm)   Pulse 76   Temp 98.1 F (36.7 C) (Oral)   Resp 16   Ht 6' (1.829 m)   Wt 148 lb (67.1 kg)   SpO2 94%   BMI 20.07 kg/m   Visual Acuity Right Eye Distance:   Left Eye Distance:   Bilateral Distance:    Right Eye Near:   Left Eye Near:    Bilateral Near:     Physical Exam Vitals and nursing note reviewed.  Constitutional:      General: He is in acute distress.     Appearance: He is not ill-appearing, toxic-appearing or diaphoretic.     Comments: Is in a lot of pain to walk, and had to be wheeled in a wheelchair  HENT:     Right Ear: External ear normal.     Left Ear: External ear normal.     Nose: Nose normal.  Eyes:     General: No scleral icterus.    Conjunctiva/sclera: Conjunctivae normal.  Pulmonary:     Effort: Pulmonary effort is normal.  Musculoskeletal:     Cervical back: Neck supple.     Comments: BACK- has loss of lordosis, is unable to straighten all the way due to pain, and he hs leaning to his L side. Has decreased spine ROM due to pain and stiffness. Has point tenderness on central lower lumbar region. Has muscle spasm present. Neg SLR  Skin:    General: Skin is warm and dry.     Findings: No rash.  Neurological:     Mental Status: He is alert and oriented to person, place, and time.     Gait: Gait normal.     Deep Tendon Reflexes: Reflexes normal.  Psychiatric:        Mood and Affect: Mood normal.        Behavior: Behavior normal.        Thought Content: Thought content normal.        Judgment: Judgment normal.      UC Treatments / Results   Labs (all labs ordered are listed, but only abnormal results are displayed) Labs Reviewed -  No data to display  EKG   Radiology No results found.  Procedures Procedures (including critical care time)  Medications Ordered in UC Medications - No data to display  Initial Impression / Assessment and Plan / UC Course  I have reviewed the triage vital signs and the nursing notes.  Lumbar strain  I placed him on Lidoderm patches. He declined Toradol shot right now. 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   Final Clinical Impressions(s) / UC Diagnoses   Final diagnoses:  Strain of lumbar region, initial encounter   Discharge Instructions   None    ED Prescriptions     Medication Sig Dispense Auth. Provider   lidocaine (LIDODERM) 5 % Place 1 patch onto the skin daily. Remove & Discard patch within 12 hours or as directed by MD 30 patch Rodriguez-Southworth, Nettie Elm, PA-C      PDMP not reviewed this encounter.   Garey Ham, Cordelia Poche 05/13/23 1929

## 2023-05-13 NOTE — ED Triage Notes (Signed)
Patient here today with c/o LB pain that started today while at work. No known injury. Patient does floor tech house keeping. He has tried taking IBU and methocarbamol with no relief. The muscle relaxer was his wife's.

## 2023-05-16 ENCOUNTER — Other Ambulatory Visit (HOSPITAL_BASED_OUTPATIENT_CLINIC_OR_DEPARTMENT_OTHER): Payer: Self-pay

## 2023-05-16 ENCOUNTER — Emergency Department (HOSPITAL_BASED_OUTPATIENT_CLINIC_OR_DEPARTMENT_OTHER)
Admission: EM | Admit: 2023-05-16 | Discharge: 2023-05-16 | Disposition: A | Discharge status: Home / Self Care | Payer: 59 | Attending: Emergency Medicine | Admitting: Emergency Medicine

## 2023-05-16 ENCOUNTER — Other Ambulatory Visit: Payer: Self-pay

## 2023-05-16 ENCOUNTER — Encounter (HOSPITAL_BASED_OUTPATIENT_CLINIC_OR_DEPARTMENT_OTHER): Payer: Self-pay | Admitting: Emergency Medicine

## 2023-05-16 DIAGNOSIS — Z91040 Latex allergy status: Secondary | ICD-10-CM | POA: Insufficient documentation

## 2023-05-16 DIAGNOSIS — M545 Low back pain, unspecified: Secondary | ICD-10-CM | POA: Insufficient documentation

## 2023-05-16 DIAGNOSIS — X500XXA Overexertion from strenuous movement or load, initial encounter: Secondary | ICD-10-CM | POA: Insufficient documentation

## 2023-05-16 MED ORDER — DEXAMETHASONE SODIUM PHOSPHATE 10 MG/ML IJ SOLN
10.0000 mg | Freq: Once | INTRAMUSCULAR | Status: AC
  Administered 2023-05-16: 10 mg via INTRAMUSCULAR
  Filled 2023-05-16: qty 1

## 2023-05-16 MED ORDER — CYCLOBENZAPRINE HCL 10 MG PO TABS
10.0000 mg | ORAL_TABLET | Freq: Two times a day (BID) | ORAL | 0 refills | Status: AC | PRN
  Filled 2023-05-16: qty 14, 7d supply, fill #0

## 2023-05-16 MED ORDER — KETOROLAC TROMETHAMINE 30 MG/ML IJ SOLN
15.0000 mg | Freq: Once | INTRAMUSCULAR | Status: AC
  Administered 2023-05-16: 15 mg via INTRAMUSCULAR
  Filled 2023-05-16: qty 1

## 2023-05-16 NOTE — ED Notes (Signed)
Discharge paperwork given and verbally understood. 

## 2023-05-16 NOTE — ED Triage Notes (Signed)
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 Pt was seen at [facility] after pain started on 12/13, but has continued to be painful with minimal relief from meds.

## 2023-05-16 NOTE — Discharge Instructions (Signed)
You have been seen today for your complaint of back pain. Your discharge medications include flexeril. This is a muscle relaxer. It may cause drowsiness. Do not drive, operate heavy machinery or make important decisions when taking this medication. Only take it at night until you know how it affects you. Only take it as needed and take other medications such as ibuprofen or tylenol prior to trying this medication.   Alternate tylenol and ibuprofen for pain. You may alternate these every 4 hours. You may take up to 800 mg of ibuprofen at a time and up to 1000 mg of tylenol. Follow up with: Dr. Hulda Humphrey.  He is an orthopedic surgeon.  Call to schedule follow-up appointment Please seek immediate medical care if you develop any of the following symptoms: You develop new bowel or bladder control problems. You have unusual weakness or numbness in your arms or legs. You feel faint. At this time there does not appear to be the presence of an emergent medical condition, however there is always the potential for conditions to change. Please read and follow the below instructions.  Do not take your medicine if  develop an itchy rash, swelling in your mouth or lips, or difficulty breathing; call 911 and seek immediate emergency medical attention if this occurs.  You may review your lab tests and imaging results in their entirety on your MyChart account.  Please discuss all results of fully with your primary care provider and other specialist at your follow-up visit.  Note: Portions of this text may have been transcribed using voice recognition software. Every effort was made to ensure accuracy; however, inadvertent computerized transcription errors may still be present.

## 2023-05-16 NOTE — ED Provider Notes (Signed)
[place] [facility] Provider Note   CSN: [id number] Arrival date & time: 05/16/23  4098     History  Chief Complaint  Patient presents with   Back Pain    Franklin [name] is a 42 y.o. male.  With history of seizure disorder, anxiety, depression, bipolar 1 disorder, schizophrenia presenting for evaluation of of low back pain.  Symptoms began 3 to 4 days ago.  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  He reports sudden onset central low back pain.  Pain has been fairly constant since that time.  Presented to urgent care and was prescribed lidocaine patches, however states that his insurance does not cover these and he is unable to afford the patches out-of-pocket.  He has been taking Tylenol and ibuprofen with minimal improvement.  Pain does not radiate from the midline low back.  No saddle anesthesia, lower extremity numbness, weakness or tingling.  No urinary or fecal incontinence.  No fevers or history of injection drug use.  He states ambulating makes the pain worse.  He denies any trauma or falls.   Back Pain      Home Medications Prior to Admission medications   Medication Sig Start Date End Date Taking? Authorizing Provider  cyclobenzaprine (FLEXERIL) 10 MG tablet Take 1 tablet (10 mg total) by mouth 2 (two) times daily as needed for up to 7 days for muscle spasms. 05/16/23 05/23/23 Yes Sharone Picchi, Edsel Petrin, PA-C  gabapentin (NEURONTIN) 300 MG capsule Take 1 capsule (300 mg total) by mouth 2 (two) times daily. 12/16/22   Standiford, Jenelle Mages, DPM  lamoTRIgine (LAMICTAL) 25 MG tablet Take 1 tablet (25 mg total) by mouth daily for 14 days, THEN 1 tablet (25 mg total) 2 (two) times daily for 14 days. 04/22/23 05/20/23  Charlynne Pander, MD  lidocaine (LIDODERM) 5 % Place 1 patch onto the skin daily. Remove & Discard patch within 12 hours or as directed by MD 05/13/23    Rodriguez-Southworth, Nettie Elm, PA-C  diphenhydrAMINE (BENADRYL) 25 MG tablet Take 2 tablets (50 mg total) by mouth at bedtime as needed for up to 7 days. 12/29/19 02/15/20  Darr, Gerilyn Pilgrim, PA-C  loratadine (CLARITIN) 10 MG tablet Take 1 tablet (10 mg total) by mouth daily. Patient not taking: Reported on 10/11/2019 09/18/19 12/02/19  Hoy Register, MD  Oxcarbazepine (TRILEPTAL) 300 MG tablet Take 1/2 tablet twice a day Patient not taking: Reported on 07/08/2019 01/26/18 07/09/19  Anders Simmonds, PA-C      Allergies    Coconut (cocos nucifera), Other, Codeine, Depakote [divalproex sodium], Hydroxyzine, Penicillins, Tape, Keflex [cephalexin], Latex, and Levetiracetam    Review of Systems   Review of Systems  Musculoskeletal:  Positive for back pain.  All other systems reviewed and are negative.   Physical Exam Updated Vital Signs BP 127/89 (BP Location: Right Arm)   Pulse (!) 50   Temp 97.6 F (36.4 C)   Resp 20   Ht 6' (1.829 m)   Wt 68 kg   SpO2 100%   BMI 20.34 kg/m  Physical Exam Vitals and nursing note reviewed.  Constitutional:      General: He is not in acute distress.    Appearance: Normal appearance. He is normal weight. He is not ill-appearing.     Comments: Resting comfortably in bed  HENT:     Head: Normocephalic and atraumatic.  Pulmonary:  Effort: Pulmonary effort is normal. No respiratory distress.  Abdominal:     General: Abdomen is flat.  Musculoskeletal:        General: Normal range of motion.     Cervical back: Neck supple.     Comments: Minimally antalgic observed gait.  Ambulatory without difficulty.  Hip strength 5 out of 5 bilaterally.  Sensation intact distally.  Straight leg raise negative bilaterally.  Midline L-spine TTP without step-offs, deformities or crepitus.  Skin:    General: Skin is warm and dry.  Neurological:     Mental Status: He is alert and oriented to person, place, and time.  Psychiatric:        Mood and Affect: Mood normal.         Behavior: Behavior normal.     ED Results / Procedures / Treatments   Labs (all labs ordered are listed, but only abnormal results are displayed) Labs Reviewed - No data to display  EKG None  Radiology No results found.  Procedures Procedures    Medications Ordered in ED Medications  ketorolac (TORADOL) 30 MG/ML injection 15 mg (has no administration in time range)  dexamethasone (DECADRON) injection 10 mg (10 mg Intramuscular Given 05/16/23 0926)    ED Course/ Medical Decision Making/ A&P                                 Medical Decision Making This patient presents to the ED for concern of back pain, this involves an extensive number of treatment options, and is a complaint that carries with it a high risk of complications and morbidity.  The emergent differential diagnosis for back pain includes but is not limited to fracture, muscle strain, cauda equina, spinal stenosis. DDD, ankylosing spondylitis, acute ligamentous injury, disk herniation, spondylolisthesis, Epidural compression syndrome, metastatic cancer, transverse myelitis, vertebral osteomyelitis, diskitis, kidney stone, pyelonephritis, AAA, Perforated ulcer, Retrocecal appendicitis, pancreatitis, bowel obstruction, retroperitoneal hemorrhage or mass, meningitis.   My initial workup includes symptom control  Additional history obtained from: Nursing notes from this visit.  42 year old male presenting for evaluation of low back pain.  Symptoms have been present for the past 3 to 4 days after lifting a heavy bag of garbage while at work.  He was unable to purchase prescription strength lidocaine patches.  Over-the-counter strength lidocaine patches not relieving his symptoms.  Pain is minimally improved by Tylenol and ibuprofen.  He denies any red flag symptoms to suggest cord compression syndrome.  He denies any trauma.  Had shared decision-making conversation with the patient regarding imaging.  Do not believe this will  be beneficial.  He appears well on physical exam.  He is able to ambulate.  Does have some tenderness to palpation of the midline L-spine.  He was given dose of Toradol and Decadron in the emergency department.  Sent prescription for muscle relaxant and educated on potential side effects.  He was encouraged to follow-up with orthopedics regarding his pain.  He was given return precautions.  Stable at discharge.  At this time there does not appear to be any evidence of an acute emergency medical condition and the patient appears stable for discharge with appropriate outpatient follow up. Diagnosis was discussed with patient who verbalizes understanding of care plan and is agreeable to discharge. I have discussed return precautions with patient who verbalizes understanding. Patient encouraged to follow-up with their PCP within 1 week. All questions answered.  Note: Portions  of this report may have been transcribed using voice recognition software. Every effort was made to ensure accuracy; however, inadvertent computerized transcription errors may still be present.         Final Clinical Impression(s) / ED Diagnoses Final diagnoses:  Acute midline low back pain without sciatica    Rx / DC Orders ED Discharge Orders          Ordered    cyclobenzaprine (FLEXERIL) 10 MG tablet  2 times daily PRN        05/16/23 0917              Michelle Piper, PA-C 05/16/23 0981    Derwood Kaplan, MD 05/17/23 [id number]

## 2023-06-18 ENCOUNTER — Other Ambulatory Visit: Payer: Self-pay

## 2023-06-18 ENCOUNTER — Emergency Department (HOSPITAL_COMMUNITY): Payer: 59

## 2023-06-18 ENCOUNTER — Encounter (HOSPITAL_COMMUNITY): Payer: Self-pay

## 2023-06-18 ENCOUNTER — Emergency Department (HOSPITAL_COMMUNITY)
Admission: EM | Admit: 2023-06-18 | Discharge: 2023-06-18 | Discharge status: Against Medical Advice | Payer: 59 | Attending: Emergency Medicine | Admitting: Emergency Medicine

## 2023-06-18 DIAGNOSIS — Z91040 Latex allergy status: Secondary | ICD-10-CM | POA: Diagnosis not present

## 2023-06-18 DIAGNOSIS — R109 Unspecified abdominal pain: Secondary | ICD-10-CM | POA: Diagnosis present

## 2023-06-18 DIAGNOSIS — K625 Hemorrhage of anus and rectum: Secondary | ICD-10-CM | POA: Insufficient documentation

## 2023-06-18 DIAGNOSIS — Z5329 Procedure and treatment not carried out because of patient's decision for other reasons: Secondary | ICD-10-CM | POA: Diagnosis not present

## 2023-06-18 DIAGNOSIS — R1084 Generalized abdominal pain: Secondary | ICD-10-CM

## 2023-06-18 LAB — COMPREHENSIVE METABOLIC PANEL
ALT: 13 U/L (ref 0–44)
AST: 16 U/L (ref 15–41)
Albumin: 4.4 g/dL (ref 3.5–5.0)
Alkaline Phosphatase: 55 U/L (ref 38–126)
Anion gap: 7 (ref 5–15)
BUN: 15 mg/dL (ref 6–20)
CO2: 23 mmol/L (ref 22–32)
Calcium: 9.3 mg/dL (ref 8.9–10.3)
Chloride: 105 mmol/L (ref 98–111)
Creatinine, Ser: 0.95 mg/dL (ref 0.61–1.24)
GFR, Estimated: >60 mL/min (ref >60)
Glucose, Bld: 85 mg/dL (ref 70–99)
Potassium: 3.9 mmol/L (ref 3.5–5.1)
Sodium: 135 mmol/L (ref 135–145)
Total Bilirubin: 1.7 mg/dL — ABNORMAL HIGH (ref 0.0–1.2)
Total Protein: 7.7 g/dL (ref 6.5–8.1)

## 2023-06-18 LAB — CBC
HCT: 46.1 % (ref 39.0–52.0)
Hemoglobin: 15.7 g/dL (ref 13.0–17.0)
MCH: 30.4 pg (ref 26.0–34.0)
MCHC: 34.1 g/dL (ref 30.0–36.0)
MCV: 89.3 fL (ref 80.0–100.0)
Platelets: 190 10*3/uL (ref 150–400)
RBC: 5.16 MIL/uL (ref 4.22–5.81)
RDW: 12.1 % (ref 11.5–15.5)
WBC: 7.8 10*3/uL (ref 4.0–10.5)
nRBC: 0 % (ref 0.0–0.2)

## 2023-06-18 LAB — TYPE AND SCREEN
ABO/RH(D): A POS
Antibody Screen: NEGATIVE

## 2023-06-18 MED ORDER — SODIUM CHLORIDE 0.9 % IV BOLUS: 1000.0000 mL | Freq: Once | INTRAVENOUS | Status: DC

## 2023-06-18 MED ORDER — IOHEXOL 300 MG/ML  SOLN: 100.0000 mL | Freq: Once | INTRAMUSCULAR | Status: DC | PRN

## 2023-06-18 NOTE — ED Notes (Signed)
Pt asked for IV to be taken out and to go home. PT signed AMA form and MD made aware

## 2023-06-18 NOTE — ED Triage Notes (Signed)
BIB EMS for bright red GI bleeding and lower abdominal pain. No hx of GI issues, no blood thinners.

## 2023-06-18 NOTE — ED Provider Notes (Signed)
[place] [facility] AT [facility] Provider Note   CSN: [id number] Arrival date & time: 06/18/23  1348     History  Chief Complaint  Patient presents with   Rectal Bleeding    Franklin [name] is a 42 y.o. male.  43 year old male with prior medical history as detailed below presents for evaluation.  Patient complains of diffuse crampy abdominal pain.  This began this afternoon.  He reports associated bright red blood per rectum with attempted BM.  He denies vomiting.  He denies fever.  He denies continued bleeding.  Symptoms apparently began when he was staying with a family member.  He reports that his family member does not like having ambulances come to the house so he got up and walked from the house in order to call an ambulance to his location after he had already left.  The history is provided by the patient and medical records.       Home Medications Prior to Admission medications   Medication Sig Start Date End Date Taking? Authorizing Provider  gabapentin (NEURONTIN) 300 MG capsule Take 1 capsule (300 mg total) by mouth 2 (two) times daily. 12/16/22   Standiford, Jenelle Mages, DPM  lamoTRIgine (LAMICTAL) 25 MG tablet Take 1 tablet (25 mg total) by mouth daily for 14 days, THEN 1 tablet (25 mg total) 2 (two) times daily for 14 days. 04/22/23 05/20/23  Charlynne Pander, MD  lidocaine (LIDODERM) 5 % Place 1 patch onto the skin daily. Remove & Discard patch within 12 hours or as directed by MD 05/13/23   Rodriguez-Southworth, Nettie Elm, PA-C  diphenhydrAMINE (BENADRYL) 25 MG tablet Take 2 tablets (50 mg total) by mouth at bedtime as needed for up to 7 days. 12/29/19 02/15/20  Darr, Gerilyn Pilgrim, PA-C  loratadine (CLARITIN) 10 MG tablet Take 1 tablet (10 mg total) by mouth daily. Patient not taking: Reported on 10/11/2019 09/18/19 12/02/19  Hoy Register, MD  Oxcarbazepine (TRILEPTAL) 300 MG tablet Take 1/2 tablet twice a day Patient not taking: Reported on 07/08/2019  01/26/18 07/09/19  Anders Simmonds, PA-C      Allergies    Coconut (cocos nucifera), Other, Codeine, Depakote [divalproex sodium], Hydroxyzine, Penicillins, Tape, Keflex [cephalexin], Latex, and Levetiracetam    Review of Systems   Review of Systems  All other systems reviewed and are negative.   Physical Exam Updated Vital Signs BP 125/70   Pulse 71   Temp 97.8 F (36.6 C) (Oral)   Resp 18   SpO2 98%  Physical Exam Vitals and nursing note reviewed.  Constitutional:      General: He is not in acute distress.    Appearance: Normal appearance. He is well-developed.  HENT:     Head: Normocephalic and atraumatic.  Eyes:     Conjunctiva/sclera: Conjunctivae normal.     Pupils: Pupils are equal, round, and reactive to light.  Cardiovascular:     Rate and Rhythm: Normal rate and regular rhythm.     Heart sounds: Normal heart sounds.  Pulmonary:     Effort: Pulmonary effort is normal. No respiratory distress.     Breath sounds: Normal breath sounds.  Abdominal:     General: There is no distension.     Palpations: Abdomen is soft.     Tenderness: There is abdominal tenderness.  Genitourinary:    Comments: Normal looking stool present with DRE.  No gross blood present.  No hemorrhoids present.  Patient was poorly compliant with insertion of finger into  the rectum. Musculoskeletal:        General: No deformity. Normal range of motion.     Cervical back: Normal range of motion and neck supple.  Skin:    General: Skin is warm and dry.  Neurological:     General: No focal deficit present.     Mental Status: He is alert and oriented to person, place, and time.     ED Results / Procedures / Treatments   Labs (all labs ordered are listed, but only abnormal results are displayed) Labs Reviewed  COMPREHENSIVE METABOLIC PANEL - Abnormal; Notable for the following components:      Result Value   Total Bilirubin 1.7 (*)    All other components within normal limits  CBC  POC  OCCULT BLOOD, ED  TYPE AND SCREEN    EKG None  Radiology No results found.  Procedures Procedures    Medications Ordered in ED Medications  sodium chloride 0.9 % bolus 1,000 mL (has no administration in time range)  iohexol (OMNIPAQUE) 300 MG/ML solution 100 mL (has no administration in time range)    ED Course/ Medical Decision Making/ A&P                                 Medical Decision Making Amount and/or Complexity of Data Reviewed Labs: ordered. Radiology: ordered.  Risk Prescription drug management.    Medical Screen Complete  This patient presented to the ED with complaint of abdominal pain, rectal bleeding.  This complaint involves an extensive number of treatment options. The initial differential diagnosis includes, but is not limited to, diverticulitis, upper GI bleeding, intra-abdominal pathology, metabolic abnormality  This presentation is: Acute, Self-Limited, Previously Undiagnosed, Uncertain Prognosis, Complicated, Systemic Symptoms, and Threat to Life/Bodily Function  Patient complains of diffuse abdominal cramping pain.  This is associated with 1 episode of BRBPR.  No evidence of continued bleeding seen on exam.  Initial hemoglobin is 15.7.  Given patient's diffuse abdominal pain CT imaging ordered.    Patient apparently decided that he wanted to leave AGAINST MEDICAL ADVICE prior to completion of CT imaging.  Patient apparently was upset that he was not allowed to eat or drink while waiting for CT.  Nursing staff informed this provider after the patient left that he was leaving AMA.  Additional history obtained: External records from outside sources obtained and reviewed including prior ED visits and prior Inpatient records.    Lab Tests:  I ordered and personally interpreted labs.  The pertinent results include: CBC, CMP   Imaging Studies ordered:  I ordered imaging studies including CT abdomen pelvis Patient left AMA prior to  imaging.  Problem List / ED Course:  Abdominal pain   Reevaluation:  After the interventions noted above, I reevaluated the patient and found that they have: improved  Disposition:  After consideration of the diagnostic results and the patients response to treatment, I feel that the patent would benefit from completion of ED evaluation..          Final Clinical Impression(s) / ED Diagnoses Final diagnoses:  Rectal bleeding  Generalized abdominal pain    Rx / DC Orders ED Discharge Orders     None         Wynetta Fines, MD 06/18/23 1752

## 2023-08-12 ENCOUNTER — Emergency Department (HOSPITAL_COMMUNITY)
Admission: EM | Admit: 2023-08-12 | Discharge: 2023-08-12 | Discharge status: Against Medical Advice | Attending: Emergency Medicine | Admitting: Emergency Medicine

## 2023-08-12 ENCOUNTER — Other Ambulatory Visit: Payer: Self-pay

## 2023-08-12 DIAGNOSIS — R42 Dizziness and giddiness: Secondary | ICD-10-CM | POA: Insufficient documentation

## 2023-08-12 DIAGNOSIS — Z5321 Procedure and treatment not carried out due to patient leaving prior to being seen by health care provider: Secondary | ICD-10-CM | POA: Insufficient documentation

## 2023-08-12 NOTE — ED Triage Notes (Signed)
 Pt was walking around at a store and became very dizzy. Pt states he did just start on a new medication (Abilify).

## 2023-08-12 NOTE — ED Notes (Signed)
 Pt has requested to leave due to wait time. Pt was advised against leaving but insisted.

## 2023-08-17 LAB — AMB RESULTS CONSOLE CBG: Glucose: 89

## 2023-08-17 NOTE — Progress Notes (Signed)
 Pt came into mobile screening at [facility] to check blood glucose and blood pressure. Pt stated he has no usual concerns in regards to either of the matters. Blood pressure is 127/86 pulse 73 and glucose non fasting 89. Pt goes to [facility] for PCP. Stated he does have a need for SDOH resources for housing and food pantries. Community referrals for these services were provided.

## 2023-08-18 ENCOUNTER — Emergency Department (HOSPITAL_COMMUNITY)

## 2023-08-18 ENCOUNTER — Encounter (HOSPITAL_COMMUNITY): Payer: Self-pay

## 2023-08-18 ENCOUNTER — Other Ambulatory Visit: Payer: Self-pay

## 2023-08-18 ENCOUNTER — Emergency Department (HOSPITAL_COMMUNITY)
Admission: EM | Admit: 2023-08-18 | Discharge: 2023-08-18 | Disposition: A | Discharge status: Home / Self Care | Attending: Emergency Medicine | Admitting: Emergency Medicine

## 2023-08-18 DIAGNOSIS — W19XXXA Unspecified fall, initial encounter: Secondary | ICD-10-CM

## 2023-08-18 DIAGNOSIS — Z91040 Latex allergy status: Secondary | ICD-10-CM | POA: Diagnosis not present

## 2023-08-18 DIAGNOSIS — W108XXA Fall (on) (from) other stairs and steps, initial encounter: Secondary | ICD-10-CM | POA: Insufficient documentation

## 2023-08-18 DIAGNOSIS — M5126 Other intervertebral disc displacement, lumbar region: Secondary | ICD-10-CM | POA: Insufficient documentation

## 2023-08-18 DIAGNOSIS — S0990XA Unspecified injury of head, initial encounter: Secondary | ICD-10-CM | POA: Diagnosis not present

## 2023-08-18 DIAGNOSIS — M545 Low back pain, unspecified: Secondary | ICD-10-CM | POA: Diagnosis present

## 2023-08-18 MED ORDER — ONDANSETRON HCL 4 MG/2ML IJ SOLN
4.0000 mg | Freq: Once | INTRAMUSCULAR | Status: AC
  Administered 2023-08-18: 4 mg via INTRAVENOUS
  Filled 2023-08-18: qty 2

## 2023-08-18 MED ORDER — KETOROLAC TROMETHAMINE 15 MG/ML IJ SOLN
15.0000 mg | Freq: Once | INTRAMUSCULAR | Status: AC
  Administered 2023-08-18: 15 mg via INTRAVENOUS
  Filled 2023-08-18: qty 1

## 2023-08-18 MED ORDER — HYDROMORPHONE HCL 1 MG/ML IJ SOLN
1.0000 mg | Freq: Once | INTRAMUSCULAR | Status: AC
  Administered 2023-08-18: 1 mg via INTRAVENOUS
  Filled 2023-08-18: qty 1

## 2023-08-18 MED ORDER — METHOCARBAMOL 500 MG PO TABS: 500.0000 mg | ORAL_TABLET | Freq: Two times a day (BID) | ORAL | 0 refills | Status: AC

## 2023-08-18 NOTE — ED Triage Notes (Signed)
 Pt to ED by EMS from home following a mechanical fall. Pt states he slipped and fell down backwards sliding down 16 steps, denies any LOC or thinner. Endorses back, neck and pain to all extremities when moving. Received fentanyl by EMS prior to arrival. VSS, NADN.

## 2023-08-18 NOTE — ED Provider Notes (Signed)
 [place] EMERGENCY DEPARTMENT AT [facility] Provider Note   CSN: [id number] Arrival date & time: 08/18/23  1952     History  Chief Complaint  Patient presents with   Franklin [name] is a 43 y.o. male who presents following mechanical fall down 16 flights of steps.  He is unsure whether he hit his head.  No loss of consciousness.  He is not on blood thinners.  He complains of pain in his cervical, thoracic and lumbar spine.  Endorses intermittent numbness and tingling in his lower extremities.  None in his upper extremities.  Endorses headache, no blurry vision, dizziness.   Fall   Past Medical History:  Diagnosis Date   Anxiety    Bipolar 1 disorder (HCC)    Chronic headaches    Conversion disorder with seizures or convulsions    Convulsion, non-epileptic (HCC)    "raises the possibility of non-epileptic events" per Neuro MD office note 03/2015   Depression    Dizziness    Hx of electroencephalogram 12/2014   normal   Insomnia    Mild cognitive impairment    Psychogenic nonepileptic seizure    Schizophrenia (HCC)    Schizophrenic disorder (HCC)    Seizure-like activity (HCC)    Seizures (HCC)        Home Medications Prior to Admission medications   Medication Sig Start Date End Date Taking? Authorizing Provider  atorvastatin (LIPITOR) 10 MG tablet Take 10 mg by mouth in the morning.   Yes [provider]  calcium carbonate (TUMS - DOSED IN MG ELEMENTAL CALCIUM) 500 MG chewable tablet Chew 2 tablets by mouth as needed for indigestion or heartburn.   Yes [provider]  lamoTRIgine (LAMICTAL) 25 MG tablet Take 1 tablet (25 mg total) by mouth daily for 14 days, THEN 1 tablet (25 mg total) 2 (two) times daily for 14 days. Patient taking differently: Take 1 tablet by mouth every night at bedtime. 04/22/23 08/18/23 Yes Charlynne Pander, MD  methocarbamol (ROBAXIN) 500 MG tablet Take 1 tablet (500 mg total) by mouth 2 (two) times  daily. 08/18/23  Yes Halford Decamp, PA-C  diphenhydrAMINE (BENADRYL) 25 MG tablet Take 2 tablets (50 mg total) by mouth at bedtime as needed for up to 7 days. 12/29/19 02/15/20  Darr, Gerilyn Pilgrim, PA-C  loratadine (CLARITIN) 10 MG tablet Take 1 tablet (10 mg total) by mouth daily. Patient not taking: Reported on 10/11/2019 09/18/19 12/02/19  Hoy Register, MD  Oxcarbazepine (TRILEPTAL) 300 MG tablet Take 1/2 tablet twice a day Patient not taking: Reported on 07/08/2019 01/26/18 07/09/19  Anders Simmonds, PA-C      Allergies    Coconut (cocos nucifera), Other, Codeine, Depakote [divalproex sodium], Hydroxyzine, Penicillins, Tape, Keflex [cephalexin], Latex, and Levetiracetam    Review of Systems   Review of Systems  Musculoskeletal:  Positive for myalgias.    Physical Exam Updated Vital Signs BP (!) 135/95 (BP Location: Right Arm)   Pulse 69   Temp 98.6 F (37 C) (Oral)   Resp (!) 21   SpO2 96%  Physical Exam Vitals and nursing note reviewed.  Constitutional:      General: He is not in acute distress.    Appearance: He is well-developed.  HENT:     Head: Normocephalic and atraumatic.  Eyes:     Conjunctiva/sclera: Conjunctivae normal.  Neck:     Comments: C-spine maintained in hard collar, exquisite midline tenderness Cardiovascular:  Rate and Rhythm: Normal rate and regular rhythm.     Heart sounds: No murmur heard. Pulmonary:     Effort: Pulmonary effort is normal. No respiratory distress.     Breath sounds: Normal breath sounds.  Abdominal:     Palpations: Abdomen is soft.     Tenderness: There is no abdominal tenderness. There is no guarding or rebound.  Musculoskeletal:        General: No swelling.     Cervical back: Neck supple.     Comments: Exquisite midline lumbar and thoracic spine tenderness, mild bilateral rib tenderness, no point of tenderness in lower extremities, 5 out of 5 strength in upper and lower extremities  Skin:    General: Skin is warm and dry.      Capillary Refill: Capillary refill takes less than 2 seconds.  Neurological:     Mental Status: He is alert.     Comments: Patient is alert and oriented. There is no abnormal phonation. Symmetric smile without facial droop.  Moves all extremities spontaneously.. No sensation deficit. There is no nystagmus. EOMI, PERRL. Coordination intact with finger to nose and normal ambulation.    Psychiatric:        Mood and Affect: Mood normal.     ED Results / Procedures / Treatments   Labs (all labs ordered are listed, but only abnormal results are displayed) Labs Reviewed - No data to display  EKG None  Radiology CT Thoracic Spine Wo Contrast Result Date: 08/18/2023 CLINICAL DATA:  Back trauma, no prior imaging (Age >= 16y). Fall, back pain EXAM: CT THORACIC SPINE WITHOUT CONTRAST TECHNIQUE: Multidetector CT images of the thoracic were obtained using the standard protocol without intravenous contrast. RADIATION DOSE REDUCTION: This exam was performed according to the departmental dose-optimization program which includes automated exposure control, adjustment of the mA and/or kV according to patient size and/or use of iterative reconstruction technique. COMPARISON:  None Available. FINDINGS: Alignment: Normal Vertebrae: No acute fracture or focal pathologic process. Paraspinal and other soft tissues: Negative. Disc levels: Negative IMPRESSION: Normal study. Electronically Signed   By: Charlett Nose M.D.   On: 08/18/2023 22:05   CT Head Wo Contrast Result Date: 08/18/2023 CLINICAL DATA:  Slipped and fell down backwards sliding down 16 steps. Back and neck pain. Pain in all extremities when moving EXAM: CT HEAD WITHOUT CONTRAST CT CERVICAL SPINE WITHOUT CONTRAST TECHNIQUE: Multidetector CT imaging of the head and cervical spine was performed following the standard protocol without intravenous contrast. Multiplanar CT image reconstructions of the cervical spine were also generated. RADIATION DOSE REDUCTION:  This exam was performed according to the departmental dose-optimization program which includes automated exposure control, adjustment of the mA and/or kV according to patient size and/or use of iterative reconstruction technique. COMPARISON:  CT head 04/22/2023 and CT cervical spine 01/23/2022 FINDINGS: CT HEAD FINDINGS Brain: No intracranial hemorrhage, mass effect, or evidence of acute infarct. No hydrocephalus. No extra-axial fluid collection. Vascular: No hyperdense vessel or unexpected calcification. Skull: No fracture or focal lesion. Sinuses/Orbits: Mucosal thickening in the left maxillary sinus with air-fluid levels. The paranasal sinuses and mastoid air cells are otherwise well aerated. Other: None. CT CERVICAL SPINE FINDINGS Alignment: No evidence of traumatic malalignment. Skull base and vertebrae: No acute fracture. No primary bone lesion or focal pathologic process. Soft tissues and spinal canal: No prevertebral fluid or swelling. No visible canal hematoma. Disc levels: Intervertebral disc space height is maintained. No severe spinal canal narrowing. Upper chest: Negative. Other: None. IMPRESSION:  No acute intracranial abnormality. No cervical spine fracture. Electronically Signed   By: Minerva Fester M.D.   On: 08/18/2023 22:05   CT Cervical Spine Wo Contrast Result Date: 08/18/2023 CLINICAL DATA:  Slipped and fell down backwards sliding down 16 steps. Back and neck pain. Pain in all extremities when moving EXAM: CT HEAD WITHOUT CONTRAST CT CERVICAL SPINE WITHOUT CONTRAST TECHNIQUE: Multidetector CT imaging of the head and cervical spine was performed following the standard protocol without intravenous contrast. Multiplanar CT image reconstructions of the cervical spine were also generated. RADIATION DOSE REDUCTION: This exam was performed according to the departmental dose-optimization program which includes automated exposure control, adjustment of the mA and/or kV according to patient size  and/or use of iterative reconstruction technique. COMPARISON:  CT head 04/22/2023 and CT cervical spine 01/23/2022 FINDINGS: CT HEAD FINDINGS Brain: No intracranial hemorrhage, mass effect, or evidence of acute infarct. No hydrocephalus. No extra-axial fluid collection. Vascular: No hyperdense vessel or unexpected calcification. Skull: No fracture or focal lesion. Sinuses/Orbits: Mucosal thickening in the left maxillary sinus with air-fluid levels. The paranasal sinuses and mastoid air cells are otherwise well aerated. Other: None. CT CERVICAL SPINE FINDINGS Alignment: No evidence of traumatic malalignment. Skull base and vertebrae: No acute fracture. No primary bone lesion or focal pathologic process. Soft tissues and spinal canal: No prevertebral fluid or swelling. No visible canal hematoma. Disc levels: Intervertebral disc space height is maintained. No severe spinal canal narrowing. Upper chest: Negative. Other: None. IMPRESSION: No acute intracranial abnormality. No cervical spine fracture. Electronically Signed   By: Minerva Fester M.D.   On: 08/18/2023 22:05   CT Lumbar Spine Wo Contrast Result Date: 08/18/2023 CLINICAL DATA:  Back trauma, no prior imaging (Age >= 16y).  Fall. EXAM: CT LUMBAR SPINE WITHOUT CONTRAST TECHNIQUE: Multidetector CT imaging of the lumbar spine was performed without intravenous contrast administration. Multiplanar CT image reconstructions were also generated. RADIATION DOSE REDUCTION: This exam was performed according to the departmental dose-optimization program which includes automated exposure control, adjustment of the mA and/or kV according to patient size and/or use of iterative reconstruction technique. COMPARISON:  None Available. FINDINGS: Segmentation: 5 lumbar type vertebrae. Alignment: Normal. Vertebrae: No acute fracture or focal pathologic process. Paraspinal and other soft tissues: Negative. 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  Diffuse disc bulge at L4-5. 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 No acute bony abnormality. Electronically Signed   By: Charlett Nose M.D.   On: 08/18/2023 22:04   DG Chest Portable 1 View Result Date: 08/18/2023 CLINICAL DATA:  Fall injury with bilateral rib cage pain.  Pole EXAM: PORTABLE CHEST 1 VIEW COMPARISON:  AP lateral chest 05/06/2023. FINDINGS: There is mild cardiomegaly again noted without evidence of CHF. The mediastinum is normally outlined. The lungs are clear. There is no appreciable pneumothorax or pleural effusion. Slight thoracic dextroscoliosis. No displaced rib fracture is seen on this single AP view. The shoulders are unremarkable. IMPRESSION: 1. No evidence of acute chest disease. Mild cardiomegaly. 2. No displaced rib fracture is seen on this single AP view. Electronically Signed   By: Almira Bar M.D.   On: 08/18/2023 20:24    Procedures Procedures    Medications Ordered in ED Medications  ketorolac (TORADOL) 15 MG/ML injection 15 mg (has no administration in time range)  HYDROmorphone (DILAUDID) injection 1 mg (1 mg Intravenous Given 08/18/23 2019)  ondansetron (ZOFRAN) injection 4 mg (4 mg Intravenous Given 08/18/23 2018)  ED Course/ Medical Decision Making/ A&P Clinical Course as of 08/18/23 2229  Thu Aug 18, 2023  2213 CT Head Wo Contrast [JT]    Clinical Course User Index [JT] Halford Decamp, PA-C                                 Medical Decision Making Amount and/or Complexity of Data Reviewed Radiology: ordered. Decision-making details documented in ED Course.  Risk Prescription drug management.   This patient presents to the ED with chief complaint(s) of mechanical fall.  The complaint involves an extensive differential diagnosis and also carries with it a high risk of complications and morbidity.   pertinent past medical history as listed in HPI  The differential diagnosis includes  Intracranial  hemorrhage, fracture, dislocation, sprain The initial plan is to  Will start with CT imaging and chest x-ray Additional history obtained: Records reviewed Care Everywhere/External Records  Initial Assessment:   Nontoxic-appearing patient presenting via EMS following mechanical fall down 16 stairs.  Unsure of head injury, no LOC.  No neurodeficits on exam.  However he does report some intermittent tingling down his lower extremities.  No urinary incontinence.  Describes exquisite pain to cervical lumbar and thoracic spine.  Independent ECG interpretation:  none  Independent labs interpretation:  The following labs were independently interpreted:  none  Independent visualization and interpretation of imaging: I independently visualized the following imaging with scope of interpretation limited to determining acute life threatening conditions related to emergency care:  cxr, which revealed no acute abnormality CT head/cervical/thoracic/lumbar with central disc herniation at L3-L4, diffuse disc bulge at L4-L5   treatment and Reassessment: Patient received 100 mcg of fentanyl from EMS with only temporary relief Following first assessment patient given 1 of Dilaudid  Reviewed findings with patient.  C-spine cleared.  He is able to ambulate.  Will give dose of Toradol and discharged home with close Ortho follow-up.  Consultations obtained:   none  Disposition:   Patient will be discharged home, prescription for Robaxin sent to pharmacy.  Provided Ortho follow-up.  The patient has been appropriately medically screened and/or stabilized in the ED. I have low suspicion for any other emergent medical condition which would require further screening, evaluation or treatment in the ED or require inpatient management. At time of discharge the patient is hemodynamically stable and in no acute distress. I have discussed work-up results and diagnosis with patient and answered all questions. Patient is  agreeable with discharge plan. We discussed strict return precautions for returning to the emergency department and they verbalized understanding.     Social Determinants of Health:   none  This note was dictated with voice recognition software.  Despite best efforts at proofreading, errors may have occurred which can change the documentation meaning.          Final Clinical Impression(s) / ED Diagnoses Final diagnoses:  Fall, initial encounter  Lumbar disc herniation    Rx / DC Orders ED Discharge Orders          Ordered    methocarbamol (ROBAXIN) 500 MG tablet  2 times daily        08/18/23 2226              Fabienne Bruns 08/18/23 2229    Anders Simmonds T, DO 08/18/23 2318

## 2023-08-18 NOTE — Discharge Instructions (Addendum)
 You were evaluated in the emergency room following a fall.  Your CT scan showed a disc herniation in your lower back.  It is unclear whether this is new or old.  A prescription for Robaxin, a muscle relaxer was sent into your pharmacy.  You additionally provided a referral to follow-up with orthopedics.  Please call make an appointment within the next week.

## 2023-09-17 ENCOUNTER — Ambulatory Visit (HOSPITAL_COMMUNITY)
Admission: EM | Admit: 2023-09-17 | Discharge: 2023-09-17 | Disposition: A | Discharge status: Home / Self Care | Attending: Physician Assistant | Admitting: Physician Assistant

## 2023-09-17 ENCOUNTER — Encounter (HOSPITAL_COMMUNITY): Payer: Self-pay | Admitting: Emergency Medicine

## 2023-09-17 ENCOUNTER — Other Ambulatory Visit: Payer: Self-pay

## 2023-09-17 ENCOUNTER — Ambulatory Visit (INDEPENDENT_AMBULATORY_CARE_PROVIDER_SITE_OTHER)

## 2023-09-17 DIAGNOSIS — M79645 Pain in left finger(s): Secondary | ICD-10-CM

## 2023-09-17 DIAGNOSIS — S63631A Sprain of interphalangeal joint of left index finger, initial encounter: Secondary | ICD-10-CM | POA: Diagnosis not present

## 2023-09-17 MED ORDER — NAPROXEN 375 MG PO TABS
375.0000 mg | ORAL_TABLET | Freq: Two times a day (BID) | ORAL | 0 refills | Status: AC
Start: 2023-09-17 — End: ?

## 2023-09-17 NOTE — ED Provider Notes (Signed)
 [facility]    CSN: [id number] Arrival date & time: 09/17/23  1413      History   Chief Complaint Chief Complaint  Patient presents with   Finger Injury    HPI Franklin [name] is a 43 y.o. male.   Patient presents today with a several hour history of left fifth finger pain.  He reports that he was at work when a patient got there a electric wheelchair stuck with a table. As they attempted to dislodge the table, patient's finger was injured by the wheelchair.  He does not believe that it was crushed but it got caught and became incredibly painful.  Pain is rated 8 on a 0-10 pain scale, described as sharp, worse with flexion, no alleviating factors identified.  He has not taken any over-the-counter medication for symptom management.  He is right-handed but uses his left hand a lot at work.  He denies any numbness or paresthesias.    Past Medical History:  Diagnosis Date   Anxiety    Bipolar 1 disorder (HCC)    Chronic headaches    Conversion disorder with seizures or convulsions    Convulsion, non-epileptic (HCC)    "raises the possibility of non-epileptic events" per Neuro MD office note 03/2015   Depression    Dizziness    Hx of electroencephalogram 12/2014   normal   Insomnia    Mild cognitive impairment    Psychogenic nonepileptic seizure    Schizophrenia (HCC)    Schizophrenic disorder (HCC)    Seizure-like activity (HCC)    Seizures (HCC)     Patient Active Problem List   Diagnosis Date Noted   Migraine without aura and without status migrainosus, not intractable 05/22/2018   Vertigo 05/22/2018   Psychogenic nonepileptic seizure 02/18/2017   Cholelithiasis 01/24/2017   Conversion disorder with seizures or convulsions 03/26/2016   Spells of decreased attentiveness 03/16/2016   Abnormal EKG 11/30/2015   Elevated troponin 11/30/2015   Bipolar disorder (HCC) 03/11/2015   Bipolar affective disorder (HCC) 01/08/2015   Generalized idiopathic epilepsy and  epileptic syndromes, without status epilepticus, not intractable (HCC) 11/27/2014   Seizure disorder (HCC) 10/14/2014    History reviewed. No pertinent surgical history.     Home Medications    Prior to Admission medications   Medication Sig Start Date End Date Taking? Authorizing Provider  naproxen  (NAPROSYN ) 375 MG tablet Take 1 tablet (375 mg total) by mouth 2 (two) times daily. 09/17/23  Yes Jerone Cudmore K, PA-C  atorvastatin (LIPITOR) 10 MG tablet Take 10 mg by mouth in the morning.    [provider]  calcium carbonate (TUMS - DOSED IN MG ELEMENTAL CALCIUM) 500 MG chewable tablet Chew 2 tablets by mouth as needed for indigestion or heartburn.    [provider]  lamoTRIgine  (LAMICTAL ) 25 MG tablet Take 1 tablet (25 mg total) by mouth daily for 14 days, THEN 1 tablet (25 mg total) 2 (two) times daily for 14 days. Patient taking differently: Take 1 tablet by mouth every night at bedtime. 04/22/23 08/18/23  Dalene Duck, MD  methocarbamol  (ROBAXIN ) 500 MG tablet Take 1 tablet (500 mg total) by mouth 2 (two) times daily. 08/18/23   Felicie Horning, PA-C  diphenhydrAMINE  (BENADRYL ) 25 MG tablet Take 2 tablets (50 mg total) by mouth at bedtime as needed for up to 7 days. 12/29/19 02/15/20  Darr, Jacob, PA-C  loratadine  (CLARITIN ) 10 MG tablet Take 1 tablet (10 mg total) by mouth daily.  Patient not taking: Reported on 10/11/2019 09/18/19 12/02/19  Newlin, Enobong, MD  Oxcarbazepine  (TRILEPTAL ) 300 MG tablet Take 1/2 tablet twice a day Patient not taking: Reported on 07/08/2019 01/26/18 07/09/19  Hassie Lint, PA-C    Family History Family History  Problem Relation Age of Onset   Kidney failure Mother    Heart disease Mother     Social History Social History   Tobacco Use   Smoking status: Never   Smokeless tobacco: Never  Vaping Use   Vaping status: Never Used  Substance Use Topics   Alcohol use: Not Currently    Comment: special occassions   Drug use:  Never     Allergies   Coconut (cocos nucifera), Other, Codeine, Depakote  [divalproex  sodium], Hydroxyzine , Penicillins, Tape, Keflex  [cephalexin ], Latex, and Levetiracetam    Review of Systems Review of Systems  Constitutional:  Positive for activity change. Negative for appetite change, fatigue and fever.  Musculoskeletal:  Positive for arthralgias. Negative for myalgias.  Skin:  Negative for color change and wound.  Neurological:  Negative for weakness and numbness.     Physical Exam Triage Vital Signs ED Triage Vitals  Encounter Vitals Group     BP 09/17/23 1436 115/74     Systolic BP Percentile --      Diastolic BP Percentile --      Pulse Rate 09/17/23 1436 80     Resp 09/17/23 1436 16     Temp 09/17/23 1436 98.2 F (36.8 C)     Temp Source 09/17/23 1436 Oral     SpO2 09/17/23 1436 99 %     Weight --      Height --      Head Circumference --      Peak Flow --      Pain Score 09/17/23 1437 6     Pain Loc --      Pain Education --      Exclude from Growth Chart --    No data found.  Updated Vital Signs BP 115/74 (BP Location: Right Arm)   Pulse 80   Temp 98.2 F (36.8 C) (Oral)   Resp 16   SpO2 99%   Visual Acuity Right Eye Distance:   Left Eye Distance:   Bilateral Distance:    Right Eye Near:   Left Eye Near:    Bilateral Near:     Physical Exam Vitals reviewed.  Constitutional:      General: He is awake.     Appearance: Normal appearance. He is well-developed. He is not ill-appearing.     Comments: Very pleasant male appears stated age in no acute distress sitting comfortably in exam room  HENT:     Head: Normocephalic and atraumatic.  Cardiovascular:     Rate and Rhythm: Normal rate and regular rhythm.     Heart sounds: Normal heart sounds, S1 normal and S2 normal. No murmur heard.    Comments: Capillary refill within 2 seconds left fingers Pulmonary:     Effort: Pulmonary effort is normal.     Breath sounds: Normal breath sounds. No  stridor. No wheezing, rhonchi or rales.     Comments: Clear to auscultation bilaterally Musculoskeletal:     Left hand: Swelling, tenderness and bony tenderness present. Decreased range of motion. Normal strength. There is no disruption of two-point discrimination. Normal capillary refill.     Comments: Left hand: Significant tenderness palpation over left little finger PIP joint without deformity.  Decreased range of motion with flexion  secondary to pain.  Hand is neurovascularly intact.  Neurological:     Mental Status: He is alert.  Psychiatric:        Behavior: Behavior is cooperative.      UC Treatments / Results  Labs (all labs ordered are listed, but only abnormal results are displayed) Labs Reviewed - No data to display  EKG   Radiology No results found.  Procedures Procedures (including critical care time)  Medications Ordered in UC Medications - No data to display  Initial Impression / Assessment and Plan / UC Course  I have reviewed the triage vital signs and the nursing notes.  Pertinent labs & imaging results that were available during my care of the patient were reviewed by me and considered in my medical decision making (see chart for details).     Patient is well-appearing, afebrile, nontoxic, nontachycardic.  Finger is neurovascularly intact.  X-ray was obtained that showed no acute osseous abnormality based on my primary read.  At the time of discharge we were waiting for radiologist overread and we will contact him if this differs and changes our treatment plan.  He was placed in a splint for comfort and support.  Recommended RICE protocol.  He was started on Naprosyn  for pain relief.  Discussed that he is not to take NSAIDs with this medication due to risk of GI bleeding.  Can use Tylenol /acetaminophen  as needed for breakthrough pain.  Recommend that he follow-up with employee health and wellness to determine if work restrictions are appropriate as he is  healing since it is not something that we typically do in urgent care.  He was given the contact information encouraged to call to schedule an appointment first thing Monday morning.  If anything worsens and he has increasing pain, swelling, discoloration, numbness or paresthesias he needs to be seen immediately.  Strict return precautions given.  Excuse note provided.  Final Clinical Impressions(s) / UC Diagnoses   Final diagnoses:  Finger pain, left  Sprain of interphalangeal joint of left index finger, initial encounter     Discharge Instructions      I did not see anything broken on your x-ray.  I will contact you if the radiologist sees something that I did not see.  Use the splint for comfort and support.  Take Naprosyn  for pain relief.  Do not take NSAIDs with this medication including aspirin, ibuprofen /Advil , naproxen /Aleve .  You can use acetaminophen /Tylenol  for breakthrough pain.  Avoid any strenuous activity including anything that requires use of the finger.  Follow-up with employee health and wellness next week to determine if you need any work restrictions.  If anything worsens and you have increasing pain, discoloration, numbness or tingling you need to be seen immediately.     ED Prescriptions     Medication Sig Dispense Auth. Provider   naproxen  (NAPROSYN ) 375 MG tablet Take 1 tablet (375 mg total) by mouth 2 (two) times daily. 14 tablet Brooklynne Pereida K, PA-C      PDMP not reviewed this encounter.   Budd Cargo, PA-C 09/17/23 1544

## 2023-09-17 NOTE — ED Triage Notes (Signed)
 Pt reports hitting his left pinky finger at work today. Having increase pain now.

## 2023-09-17 NOTE — Discharge Instructions (Signed)
 I did not see anything broken on your x-ray.  I will contact you if the radiologist sees something that I did not see.  Use the splint for comfort and support.  Take Naprosyn  for pain relief.  Do not take NSAIDs with this medication including aspirin, ibuprofen /Advil , naproxen /Aleve .  You can use acetaminophen /Tylenol  for breakthrough pain.  Avoid any strenuous activity including anything that requires use of the finger.  Follow-up with employee health and wellness next week to determine if you need any work restrictions.  If anything worsens and you have increasing pain, discoloration, numbness or tingling you need to be seen immediately.

## 2023-09-22 NOTE — Progress Notes (Unsigned)
 The patient attended a screening event on 08/17/2023, where his blood pressure was measured at 127/86 mmHg, and his blood glucose was 69 mg/dl. During the event, the patient reported that he does not smoke, he di not indicate any insurance, is established with a primary care provider [facility]), and does have food and housing SDOH needs indicated.   A chart review confirmed that the patient has an active PCP. Maryellen Snare, NP - [facility]. Health is listed as his PCP. Chart review further indicates that the pt has [employer] and Raina Bunting as his insurances. There are no [facility] visible encounter nor appts with this PCP ever.  CHW called PCP to obtain status and appts. Pt was last seen with them on 10/27/2022. He had a cancelled appt on 09/15/2023 with his PCP. CHW called pt to discuss screening results and SDOH needs. CHW was unable to leave vm due to number being invalid.  At this time, no additional support from the Health Equity Team is indicated./An additional follow up will be done at a later date per the Health Equity Teams protocol.

## 2023-09-28 ENCOUNTER — Other Ambulatory Visit: Payer: Self-pay

## 2023-09-28 ENCOUNTER — Emergency Department (HOSPITAL_COMMUNITY)
Admission: EM | Admit: 2023-09-28 | Discharge: 2023-09-28 | Disposition: A | Discharge status: Home / Self Care | Attending: Emergency Medicine | Admitting: Emergency Medicine

## 2023-09-28 ENCOUNTER — Encounter (HOSPITAL_COMMUNITY): Payer: Self-pay

## 2023-09-28 DIAGNOSIS — Z91040 Latex allergy status: Secondary | ICD-10-CM | POA: Diagnosis not present

## 2023-09-28 DIAGNOSIS — R42 Dizziness and giddiness: Secondary | ICD-10-CM | POA: Insufficient documentation

## 2023-09-28 LAB — CBG MONITORING, ED: Glucose-Capillary: 97 mg/dL (ref 70–99)

## 2023-09-28 MED ORDER — MECLIZINE HCL 25 MG PO TABS
25.0000 mg | ORAL_TABLET | Freq: Once | ORAL | Status: AC
  Administered 2023-09-28: 25 mg via ORAL
  Filled 2023-09-28: qty 1

## 2023-09-28 MED ORDER — MECLIZINE HCL 25 MG PO TABS: 25.0000 mg | ORAL_TABLET | Freq: Three times a day (TID) | ORAL | 0 refills | Status: DC | PRN

## 2023-09-28 NOTE — ED Notes (Signed)
 Called lab to inquire about why patients blood work had not resulted yet that had been sent down. Per Katie in lab she looked in multiple different places and did not see blood tubes. Charge RN also contacted same. This Clinical research associate sent labeled labs around 1755 this evening. Pt was advised of same and stated he did not want to stay and wanted his IV taken out. Provider made aware and discharge orders placed.

## 2023-09-28 NOTE — ED Notes (Signed)
 Seizure pads placed on bed

## 2023-09-28 NOTE — Discharge Instructions (Addendum)
 Please take meclizine  as needed for your dizziness.  Make sure to eat appropriately as it will help with your condition.  Follow up with your doctor for further care.  Return if you have any concerns.

## 2023-09-28 NOTE — ED Notes (Signed)
 This NT took patients IV out and patient informed NT that he is allergic to the tape. Patients informed NT after NT went back in room to tell him he was good to go, via the paramedic, that he was still bleeding and still didn't want tape on IV site. NT told patient to just hold pressure until it stopped bleeding.

## 2023-09-28 NOTE — ED Triage Notes (Signed)
 Pt bib [facility] from the [facility]. He says he feels this way when he feels like he is going to have a seizure Takes Lamictal  and hasn't had a seizure in months. 18 LAC 4 mg zofran 

## 2023-09-28 NOTE — ED Provider Notes (Signed)
 [place] EMERGENCY DEPARTMENT AT [facility] Provider Note   CSN: [id number] Arrival date & time: 09/28/23  1437     History  Chief Complaint  Patient presents with   Dizziness    Franklin [name] is a 43 y.o. male.  The history is provided by the patient and medical records. No language interpreter was used.  Dizziness    43 year old male with history of bipolar, conversion disorder, NPES, schizophrenia brought here via EMS [place] bus stop with concerns of dizziness.  Patient reports he works third shift.  He got off from work today, and was at the bus depot waiting for his bus and when he stood up he felt dizzy.  Described as a room spinning sensation that was intense possibly some mild throbbing headache.  He was afraid to move about in fear of falling and did contact EMS to be brought here.  He mention his symptom has improved.  He attributed to not eating any food this morning.  He mentioned has had similar dizziness like this in the past usually lasting for 30-40 minutes and resolved after resting.  He denies any cold symptoms no urinary symptoms no trouble breathing and denies any focal numbness or focal weakness.  Denies any double vision or loss of vision.  No recent medication changes.  Home Medications Prior to Admission medications   Medication Sig Start Date End Date Taking? Authorizing Provider  atorvastatin (LIPITOR) 10 MG tablet Take 10 mg by mouth in the morning.    [provider]  calcium carbonate (TUMS - DOSED IN MG ELEMENTAL CALCIUM) 500 MG chewable tablet Chew 2 tablets by mouth as needed for indigestion or heartburn.    [provider]  lamoTRIgine  (LAMICTAL ) 25 MG tablet Take 1 tablet (25 mg total) by mouth daily for 14 days, THEN 1 tablet (25 mg total) 2 (two) times daily for 14 days. Patient taking differently: Take 1 tablet by mouth every night at bedtime. 04/22/23 08/18/23  Dalene Duck, MD  methocarbamol  (ROBAXIN ) 500 MG  tablet Take 1 tablet (500 mg total) by mouth 2 (two) times daily. 08/18/23   Felicie Horning, PA-C  naproxen  (NAPROSYN ) 375 MG tablet Take 1 tablet (375 mg total) by mouth 2 (two) times daily. 09/17/23   Raspet, Erin K, PA-C  diphenhydrAMINE  (BENADRYL ) 25 MG tablet Take 2 tablets (50 mg total) by mouth at bedtime as needed for up to 7 days. 12/29/19 02/15/20  Darr, Jacob, PA-C  loratadine  (CLARITIN ) 10 MG tablet Take 1 tablet (10 mg total) by mouth daily. Patient not taking: Reported on 10/11/2019 09/18/19 12/02/19  Newlin, Enobong, MD  Oxcarbazepine  (TRILEPTAL ) 300 MG tablet Take 1/2 tablet twice a day Patient not taking: Reported on 07/08/2019 01/26/18 07/09/19  Hassie Lint, PA-C      Allergies    Coconut (cocos nucifera), Other, Codeine, Depakote  [divalproex  sodium], Hydroxyzine , Penicillins, Tape, Keflex  [cephalexin ], Latex, and Levetiracetam     Review of Systems   Review of Systems  Neurological:  Positive for dizziness.  All other systems reviewed and are negative.   Physical Exam Updated Vital Signs BP (!) 143/89   Pulse 60   Temp 98 F (36.7 C) (Oral)   Resp 17   Ht 6' (1.829 m)   Wt 68 kg   SpO2 99%   BMI 20.33 kg/m  Physical Exam Vitals and nursing note reviewed.  Constitutional:      General: He is not in acute distress.  Appearance: He is well-developed.  HENT:     Head: Atraumatic.     Right Ear: There is impacted cerumen.     Left Ear: Tympanic membrane normal.  Eyes:     Extraocular Movements: Extraocular movements intact.     Conjunctiva/sclera: Conjunctivae normal.     Pupils: Pupils are equal, round, and reactive to light.  Cardiovascular:     Rate and Rhythm: Normal rate and regular rhythm.     Pulses: Normal pulses.     Heart sounds: Normal heart sounds.  Pulmonary:     Effort: Pulmonary effort is normal.     Breath sounds: Normal breath sounds.  Abdominal:     Palpations: Abdomen is soft.  Musculoskeletal:        General: Normal range of motion.      Cervical back: Normal range of motion and neck supple.  Skin:    Findings: No rash.  Neurological:     Mental Status: He is alert. Mental status is at baseline.     ED Results / Procedures / Treatments   Labs (all labs ordered are listed, but only abnormal results are displayed) Labs Reviewed  COMPREHENSIVE METABOLIC PANEL WITH GFR  CBC WITH DIFFERENTIAL/PLATELET  CBG MONITORING, ED    EKG None  Radiology No results found.  Procedures Procedures    Medications Ordered in ED Medications - No data to display  ED Course/ Medical Decision Making/ A&P                                 Medical Decision Making Amount and/or Complexity of Data Reviewed ECG/medicine tests: ordered.   BP 132/79 (BP Location: Right Arm)   Pulse 60   Temp 97.7 F (36.5 C) (Oral)   Resp 19   Ht 6' (1.829 m)   Wt 68 kg   SpO2 98%   BMI 20.33 kg/m   15:69 PM  43 year old male with history of bipolar, conversion disorder, NPES, schizophrenia brought here via EMS from the bus stop with concerns of dizziness.  Patient reports he works third shift.  He got off from work today, and was at the bus depot waiting for his bus and when he stood up he felt dizzy.  Described as a room spinning sensation that was intense possibly some mild throbbing headache.  He was afraid to move about in fear of falling and did contact EMS to be brought here.  He mention his symptom has improved.  He attributed to not eating any food this morning.  He mentioned has had similar dizziness like this in the past usually lasting for 30-40 minutes and resolved after resting.  He denies any cold symptoms no urinary symptoms no trouble breathing and denies any focal numbness or focal weakness.  Denies any double vision or loss of vision.  No recent medication changes.  On exam patient is resting comfortably appears to be in no acute discomfort.  Ear exam notable for cerumen impaction involving the right ear canal.  He is  without any focal neurodeficit.  No concerning nystagmus.  He is able to ambulate.  -Labs ordered, independently viewed and interpreted by me.  Labs remarkable for normal CBG.  Labs obtained, but hemolyzed.  Offer to redrawn labs but pt declined.  -The patient was maintained on a cardiac monitor.  I personally viewed and interpreted the cardiac monitored which showed an underlying rhythm of: NSR -Imaging including brain  MRI considered but pt able to ambulate, does not have any sxs concerning for central cause of vertigo. -This patient presents to the ED for concern of dizzy, this involves an extensive number of treatment options, and is a complaint that carries with it a high risk of complications and morbidity.  The differential diagnosis includes central vertigo, peripheral vertigo, anemia, cardiac arrhythmia, electrolytes imbalance, infection, anemia -Co morbidities that complicate the patient evaluation includes conversion disorder, bipolar, recurrent vertigo -Treatment includes meclizine  -Reevaluation of the patient after these medicines showed that the patient improved -PCP office notes or outside notes reviewed -Escalation to admission/observation considered: patients feels much better, is comfortable with discharge, and will follow up with PCP -Prescription medication considered, patient comfortable with meclizine  -Social Determinant of Health considered which includes food insecurity, housing insecurity         Final Clinical Impression(s) / ED Diagnoses Final diagnoses:  Dizziness    Rx / DC Orders ED Discharge Orders          Ordered    meclizine  (ANTIVERT ) 25 MG tablet  3 times daily PRN        09/28/23 2108              Debbra Fairy, PA-C 09/28/23 2109    Lind Repine, MD 10/02/23 [phone]

## 2023-10-13 ENCOUNTER — Other Ambulatory Visit: Payer: Self-pay

## 2023-10-13 ENCOUNTER — Encounter (HOSPITAL_COMMUNITY): Payer: Self-pay

## 2023-10-13 ENCOUNTER — Emergency Department (HOSPITAL_COMMUNITY)
Admission: EM | Admit: 2023-10-13 | Discharge: 2023-10-14 | Disposition: A | Discharge status: Home / Self Care | Attending: Emergency Medicine | Admitting: Emergency Medicine

## 2023-10-13 DIAGNOSIS — K8020 Calculus of gallbladder without cholecystitis without obstruction: Secondary | ICD-10-CM | POA: Diagnosis not present

## 2023-10-13 DIAGNOSIS — R569 Unspecified convulsions: Secondary | ICD-10-CM | POA: Diagnosis present

## 2023-10-13 LAB — BASIC METABOLIC PANEL WITH GFR
Anion gap: 9 (ref 5–15)
BUN: 12 mg/dL (ref 6–20)
CO2: 23 mmol/L (ref 22–32)
Calcium: 9.2 mg/dL (ref 8.9–10.3)
Chloride: 106 mmol/L (ref 98–111)
Creatinine, Ser: 1.06 mg/dL (ref 0.61–1.24)
GFR, Estimated: >60 mL/min (ref >60)
Glucose, Bld: 94 mg/dL (ref 70–99)
Potassium: 3.8 mmol/L (ref 3.5–5.1)
Sodium: 138 mmol/L (ref 135–145)

## 2023-10-13 LAB — CBC
HCT: 41.5 % (ref 39.0–52.0)
Hemoglobin: 14.3 g/dL (ref 13.0–17.0)
MCH: 30.2 pg (ref 26.0–34.0)
MCHC: 34.5 g/dL (ref 30.0–36.0)
MCV: 87.7 fL (ref 80.0–100.0)
Platelets: 184 10*3/uL (ref 150–400)
RBC: 4.73 MIL/uL (ref 4.22–5.81)
RDW: 12.2 % (ref 11.5–15.5)
WBC: 6.5 10*3/uL (ref 4.0–10.5)
nRBC: 0 % (ref 0.0–0.2)

## 2023-10-13 LAB — CBG MONITORING, ED: Glucose-Capillary: 82 mg/dL (ref 70–99)

## 2023-10-13 MED ORDER — LAMOTRIGINE 25 MG PO TABS
25.0000 mg | ORAL_TABLET | Freq: Once | ORAL | Status: AC
  Administered 2023-10-14: 25 mg via ORAL
  Filled 2023-10-13: qty 1

## 2023-10-13 NOTE — ED Provider Notes (Signed)
 [facility] Emergency Department Provider Note MRN:  [id number]  Arrival date & time: 10/14/23     Chief Complaint   Seizures   History of Present Illness   Franklin [name] is a 43 y.o. year-old male presents to the ED with chief complaint of seizure like activity.  Has well documented history of non-epileptic seizures.  Wife states that they were out running errands and she noticed him slumped over in the truck drooling.  He proceeded to have 2-3 full body shaking spells.  The shaking lasted less than 2 minutes.  EMS was called and patient was given 5mg  IM versed .  Wife denies any recent illnesses.  Patient is a bit sedated from the Versed , but is able to tell me that he has had some abdominal pain recently.  He denies fever, chills, cough, n/v/d.  Wife states that he has been off of his Lamictal  because he doesn't like how it makes him feel.  He has hx of schizophrenia and bipolar.  History provided by patient.   Review of Systems  Pertinent positive and negative review of systems noted in HPI.    Physical Exam   Vitals:   10/14/23 0130 10/14/23 0145  BP: 128/84 132/77  Pulse: 61 (!) 59  Resp: 14 16  Temp:    SpO2: 100% 100%    CONSTITUTIONAL:  somnolent-appearing, NAD NEURO:  Drowsy, but arousable, GCS 13 EYES:  eyes equal and reactive ENT/NECK:  Supple, no stridor  CARDIO:  normal rate, regular rhythm, appears well-perfused  PULM:  No respiratory distress, CTAB GI/GU:  non-distended, no focal tenderness MSK/SPINE:  No gross deformities, no edema, moves all extremities  SKIN:  no rash, atraumatic   *Additional and/or pertinent findings included in MDM below  Diagnostic and Interventional Summary    EKG Interpretation Date/Time:    Ventricular Rate:    PR Interval:    QRS Duration:    QT Interval:    QTC Calculation:   R Axis:      Text Interpretation:         Labs Reviewed  CBC  BASIC METABOLIC PANEL WITH GFR  LIPASE, BLOOD   HEPATIC FUNCTION PANEL  RAPID URINE DRUG SCREEN, HOSP PERFORMED  CBG MONITORING, ED  CBG MONITORING, ED    CT HEAD WO CONTRAST ( )  Final Result    CT ABDOMEN PELVIS W CONTRAST  Final Result      Medications  lamoTRIgine  (LAMICTAL ) tablet 25 mg (25 mg Oral Given 10/14/23 0253)  iohexol  (OMNIPAQUE ) 350 MG/ML injection 75 mL (75 mLs Intravenous Contrast Given 10/14/23 0049)     Procedures  /  Critical Care Procedures  ED Course and Medical Decision Making  I have reviewed the triage vital signs, the nursing notes, and pertinent available records from the EMR.  Social Determinants Affecting Complexity of Care: Patient has no clinically significant social determinants affecting this chief complaint..   ED Course:    Medical Decision Making Patient brought in for seizure-like activity.  He had 2-3 episodes of full body shaking that lasted less than 2 minutes.  He has well-documented history of nonepileptic seizures.  He has history of schizophrenia and bipolar.  His wife tells me that he has not been taking his Lamictal .  Patient states that this makes him feel off, and he does not like taking it.  He states that he does have a refill waiting for him at the pharmacy.  Labs and workup are reassuring in the emergency  department.  Vital signs are stable.  Patient was initially somewhat sedate due to EMS having administered Versed .  He is now back to baseline.  I believe him to be stable for discharge home at this time.  I have sent a refill of his Lamictal  in case he has issues getting it from his pharmacy.  Amount and/or Complexity of Data Reviewed Labs: ordered. Radiology: ordered.  Risk Prescription drug management.         Consultants: No consultations were needed in caring for this patient.   Treatment and Plan: I considered admission due to patient's initial presentation, but after considering the examination and diagnostic results, patient will not require  admission and can be discharged with outpatient follow-up.    Final Clinical Impressions(s) / ED Diagnoses     ICD-10-CM   1. Seizure-like activity (HCC)  R56.9       ED Discharge Orders          Ordered    lamoTRIgine  (LAMICTAL ) 25 MG tablet        10/14/23 0322              Discharge Instructions Discussed with and Provided to Patient:     Discharge Instructions      Please follow-up with your regular doctor.  Continue taking your regular medications.  Return for new or worsening symptoms.     Sherel Dikes, PA-C 10/14/23 [id number]    Lindle Rhea, MD 10/15/23 0730

## 2023-10-13 NOTE — ED Triage Notes (Signed)
 Patient BIB [facility] from home, witness seizure by family in 3 episodes lasting total 5 minutes. Patient was initially postictal, then refused transport and was combative but agreed to come and EMS gave 5 midazolam  IM. Family rode to ED with patient to help keep him calm en route. Patient has recently had medication changes as well as hx of bipolar and schizophrenia that contributes to noncompliance. Patient is A&Ox4 at this time, but sleepy d/t meds.

## 2023-10-14 ENCOUNTER — Emergency Department (HOSPITAL_COMMUNITY)

## 2023-10-14 DIAGNOSIS — R569 Unspecified convulsions: Secondary | ICD-10-CM | POA: Diagnosis not present

## 2023-10-14 LAB — HEPATIC FUNCTION PANEL
ALT: 28 U/L (ref 0–44)
AST: 26 U/L (ref 15–41)
Albumin: 3.9 g/dL (ref 3.5–5.0)
Alkaline Phosphatase: 53 U/L (ref 38–126)
Bilirubin, Direct: 0.2 mg/dL (ref 0.0–0.2)
Indirect Bilirubin: 0.9 mg/dL (ref 0.3–0.9)
Total Bilirubin: 1.1 mg/dL (ref 0.0–1.2)
Total Protein: 7 g/dL (ref 6.5–8.1)

## 2023-10-14 LAB — LIPASE, BLOOD: Lipase: 28 U/L (ref 11–51)

## 2023-10-14 MED ORDER — IOHEXOL 350 MG/ML SOLN
75.0000 mL | Freq: Once | INTRAVENOUS | Status: AC | PRN
  Administered 2023-10-14: 75 mL via INTRAVENOUS

## 2023-10-14 MED ORDER — LAMOTRIGINE 25 MG PO TABS: ORAL_TABLET | ORAL | 0 refills | Status: DC

## 2023-10-14 NOTE — ED Notes (Signed)
 Please update spouse number under emergency contacts

## 2023-10-14 NOTE — Discharge Instructions (Signed)
 Please follow-up with your regular doctor.  Continue taking your regular medications.  Return for new or worsening symptoms.

## 2023-10-24 ENCOUNTER — Telehealth: Payer: Self-pay | Admitting: *Deleted

## 2023-10-24 ENCOUNTER — Other Ambulatory Visit: Payer: Self-pay

## 2023-10-24 ENCOUNTER — Encounter (HOSPITAL_COMMUNITY): Payer: Self-pay

## 2023-10-24 ENCOUNTER — Emergency Department (HOSPITAL_COMMUNITY)
Admission: EM | Admit: 2023-10-24 | Discharge: 2023-10-24 | Disposition: A | Discharge status: Home / Self Care | Attending: Emergency Medicine | Admitting: Emergency Medicine

## 2023-10-24 ENCOUNTER — Emergency Department (HOSPITAL_COMMUNITY)

## 2023-10-24 DIAGNOSIS — R051 Acute cough: Secondary | ICD-10-CM

## 2023-10-24 DIAGNOSIS — R110 Nausea: Secondary | ICD-10-CM

## 2023-10-24 DIAGNOSIS — R112 Nausea with vomiting, unspecified: Secondary | ICD-10-CM | POA: Diagnosis present

## 2023-10-24 LAB — I-STAT CHEM 8, ED
BUN: 16 mg/dL (ref 6–20)
Calcium, Ion: 1.17 mmol/L (ref 1.15–1.40)
Chloride: 107 mmol/L (ref 98–111)
Creatinine, Ser: 1 mg/dL (ref 0.61–1.24)
Glucose, Bld: 95 mg/dL (ref 70–99)
HCT: 43 % (ref 39.0–52.0)
Hemoglobin: 14.6 g/dL (ref 13.0–17.0)
Potassium: 4 mmol/L (ref 3.5–5.1)
Sodium: 141 mmol/L (ref 135–145)
TCO2: 24 mmol/L (ref 22–32)

## 2023-10-24 MED ORDER — ONDANSETRON 4 MG PO TBDP
4.0000 mg | ORAL_TABLET | Freq: Once | ORAL | Status: AC
  Administered 2023-10-24: 4 mg via ORAL
  Filled 2023-10-24: qty 1

## 2023-10-24 MED ORDER — GUAIFENESIN-DM 100-10 MG/5ML PO SYRP
5.0000 mL | ORAL_SOLUTION | ORAL | Status: DC | PRN
  Administered 2023-10-24: 5 mL via ORAL
  Filled 2023-10-24: qty 5

## 2023-10-24 MED ORDER — GUAIFENESIN 100 MG/5ML PO LIQD: 100.0000 mg | ORAL | 0 refills | Status: AC | PRN

## 2023-10-24 MED ORDER — ONDANSETRON 4 MG PO TBDP
4.0000 mg | ORAL_TABLET | Freq: Three times a day (TID) | ORAL | 0 refills | Status: AC | PRN
Start: 2023-10-24 — End: ?

## 2023-10-24 NOTE — ED Provider Notes (Signed)
 [facility] Emergency Department Provider Note MRN:  [id number]  Arrival date & time: 10/24/23     Chief Complaint   Nausea   History of Present Illness   Franklin [name] is a 43 y.o. year-old male presents to the ED with chief complaint of nausea and vomiting.  Had an episode prior to arrival.  States that he attributes the symptoms to having had his wife's fan blowing on him.  He denies any diarrhea.  He denies any abdominal pain.  He also reports a dry cough.  He denies fevers or chills.  Symptoms just started 2 hours ago.  History provided by patient.   Review of Systems  Pertinent positive and negative review of systems noted in HPI.    Physical Exam   Vitals:   10/24/23 0440 10/24/23 0500  BP: 133/65 115/81  Pulse: 60 63  Resp: 16 18  Temp: 98.2 F (36.8 C)   SpO2: 100% 100%    CONSTITUTIONAL:  non toxic-appearing, NAD NEURO:  Alert and oriented x 3, CN 3-12 grossly intact EYES:  eyes equal and reactive ENT/NECK:  Supple, no stridor  CARDIO:  normal rate, regular rhythm, appears well-perfused  PULM:  No respiratory distress, CTAB GI/GU:  non-distended, no focal abdominal tenderness MSK/SPINE:  No gross deformities, no edema, moves all extremities  SKIN:  no rash, atraumatic   *Additional and/or pertinent findings included in MDM below  Diagnostic and Interventional Summary    EKG Interpretation Date/Time:    Ventricular Rate:    PR Interval:    QRS Duration:    QT Interval:    QTC Calculation:   R Axis:      Text Interpretation:         Labs Reviewed  I-STAT CHEM 8, ED    DG Chest Port 1 View  Final Result      Medications  guaiFENesin-dextromethorphan (ROBITUSSIN DM) 100-10 MG/5ML syrup 5 mL (5 mLs Oral Given 10/24/23 0504)  ondansetron  (ZOFRAN -ODT) disintegrating tablet 4 mg (4 mg Oral Given 10/24/23 0454)     Procedures  /  Critical Care Procedures  ED Course and Medical Decision Making  I have reviewed the  triage vital signs, the nursing notes, and pertinent available records from the EMR.  Social Determinants Affecting Complexity of Care: Patient has no clinically significant social determinants affecting this chief complaint..   ED Course:    Medical Decision Making Patient here with nausea and vomiting.  He had an episode or 2 that started around 215 this morning.  He has not had any vomiting with EMS or in the emergency department.  He is now tolerating oral intake.  Chem-8 is normal.  Vital signs are stable.  He does not have any focal abdominal tenderness.  I doubt surgical or acute abdomen.  Cough, but no fever.  Likely viral.  Amount and/or Complexity of Data Reviewed Radiology: ordered.  Risk OTC drugs. Prescription drug management.         Consultants: No consultations were needed in caring for this patient.   Treatment and Plan: Emergency department workup does not suggest an emergent condition requiring admission or immediate intervention beyond  what has been performed at this time. The patient is safe for discharge and has  been instructed to return immediately for worsening symptoms, change in  symptoms or any other concerns    Final Clinical Impressions(s) / ED Diagnoses     ICD-10-CM   1. Nausea  R11.0  2. Acute cough  R05.1       ED Discharge Orders          Ordered    ondansetron  (ZOFRAN -ODT) 4 MG disintegrating tablet  Every 8 hours PRN        10/24/23 0516    guaiFENesin (ROBITUSSIN) 100 MG/5ML liquid  Every 4 hours PRN        10/24/23 0516              Discharge Instructions Discussed with and Provided to Patient:   Discharge Instructions   None      Sherel Dikes, PA-C 10/24/23 0557    Kelsey Patricia, MD 10/24/23 [phone]

## 2023-10-24 NOTE — ED Triage Notes (Signed)
 Patient BIB [facility] due to nausea and vomiting. Patient stated this started at 0215 this morning. Denies CP and SOB. Patient ambulatory to room. VSS with EMS.

## 2023-10-24 NOTE — Telephone Encounter (Signed)
 Pt called regarding  pharmacy Rx was e-scribed to not being open today for pickup.  RNCM reviewed chart to access prescription and called in to alternate pharmacy of choice.  Advised pt to pick up at his convenience.

## 2024-01-04 NOTE — Progress Notes (Signed)
 Pt attended 08/17/23 screening event where his bp was 127/86 and blood glucose was 69. Pt documented [employer] as his PCP and did not document insurance. Pt noted he was not an active smoker and also documented SDOH need for food and housing. At screening event CMA gave pt SDOH resources.  Chart review indicated pt has insurance through [employer].Pt has had a number of emergency room visits since screening event. Most recent ED visit was on 10/24/2023. There are no PCP office visit that can be seen in [facility] within the past 12 months.  CHW called pt to verify if pt is still with [facility] and to f/u on SDOH needs. Pt answered his cell phone and when CHW stated the reason for the call the pt yelled incoherently and call was dropped. CHW attempted to call pt back and was sent to VM, a VM was left asking for pt to return call at his earliest convenience. CHW called PCP office at [facility] and spoke to receptionist [place]. PCP office confirmed pt is still established with them however they have not seen the pt since May of 2024. CHW was also told by the receptionist that pt had a recent no show in April of 2025 and pt does currently has no future appts at this time. Letter sent to pt with SDOH resources for food and housing. Follow up to be scheduled per health equity protocol.

## 2024-01-25 ENCOUNTER — Encounter: Payer: Self-pay | Admitting: Physician Assistant

## 2024-01-25 ENCOUNTER — Ambulatory Visit: Admitting: Physician Assistant

## 2024-01-25 VITALS — BP 117/77 | HR 63

## 2024-01-25 DIAGNOSIS — Z13220 Encounter for screening for lipoid disorders: Secondary | ICD-10-CM

## 2024-01-25 DIAGNOSIS — R42 Dizziness and giddiness: Secondary | ICD-10-CM | POA: Diagnosis not present

## 2024-01-25 MED ORDER — MECLIZINE HCL 25 MG PO TABS
25.0000 mg | ORAL_TABLET | Freq: Three times a day (TID) | ORAL | 0 refills | Status: AC | PRN
Start: 2024-01-25 — End: ?

## 2024-01-25 NOTE — Patient Instructions (Addendum)
 VISIT SUMMARY:  Franklin [name], you visited today due to ongoing dizziness that you've been experiencing for over a year. We discussed your history of seizures and your current medication regimen.  YOUR PLAN:  -DIZZINESS: Dizziness can be caused by various factors, including inner ear issues, dehydration, or other medical conditions. We will conduct lab work to check your thyroid  function and cholesterol levels. Your meclizine  prescription has been refilled, and you should continue taking it as needed. Please keep a diary of your dizziness episodes, noting any potential triggers such as sleep quality, stress level, and hydration.  -SEIZURE DISORDER: A seizure disorder is a condition where you experience seizures due to abnormal electrical activity in the brain. You should continue taking Lamictal  as prescribed to manage your condition. There have been no recent seizures, which is a good sign.  Dizziness Dizziness is a common problem. It makes you feel unsteady or light-headed. You may feel like you're about to faint. Dizziness can lead to getting hurt if you stumble or fall. It's more common to feel dizzy if you're an older adult. Many things can cause you to feel dizzy. These include: Medicines. Dehydration. This is when there's not enough water in your body. Illness. Follow these instructions at home: Eating and drinking  Drink enough fluid to keep your pee (urine) pale yellow. This helps keep you from getting dehydrated. Try to drink more clear fluids, such as water. Do not drink alcohol. Try to limit how much caffeine you take in. Try to limit how much salt, also called sodium, you take in. Activity Try not to make quick movements. Stand up slowly from sitting in a chair. Steady yourself until you feel okay. In the morning, first sit up on the side of the bed. When you feel okay, hold onto something and slowly stand up. Do this until you know that your balance is okay. If you need  to stand in one place for a long time, move your legs often. Tighten and relax the muscles in your legs while you're standing. Do not drive or use machines if you feel dizzy. Avoid bending down if you feel dizzy. Place items in your home so you can reach them without leaning over. Lifestyle Do not smoke, vape, or use products with nicotine or tobacco in them. If you need help quitting, talk with your health care provider. Try to lower your stress level. You can do this by using methods like yoga or meditation. Talk with your provider if you need help. General instructions Watch your dizziness for any changes. Take your medicines only as told by your provider. Talk with your provider if you think you're dizzy because of a medicine you're taking. Tell a friend or a family member that you're feeling dizzy. If they spot any changes in your behavior, have them call your provider. Contact a health care provider if: Your dizziness doesn't go away, or you have new symptoms. Your dizziness gets worse. You feel like you may vomit. You have trouble hearing. You have a fever. You have neck pain or a stiff neck. You fall or get hurt. Get help right away if: You vomit each time you eat or drink. You have watery poop and can't eat or drink. You have trouble talking, walking, swallowing, or using your arms, hands, or legs. You feel very weak. You're bleeding. You're not thinking clearly, or you have trouble forming sentences. A friend or family member may spot this. Your vision changes, or you get  a very bad headache. These symptoms may be an emergency. Call 911 right away. Do not wait to see if the symptoms will go away. Do not drive yourself to the hospital. This information is not intended to replace advice given to you by your health care provider. Make sure you discuss any questions you have with your health care provider. Document Revised: 02/17/2023 Document Reviewed: 07/01/2022 Elsevier  Patient Education  2024 [employer].

## 2024-01-25 NOTE — Progress Notes (Signed)
 New Patient Office Visit  Subjective    Patient ID: Franklin [name], male    DOB: 1980-08-29  Age: 43 y.o. MRN: [id number]  CC:  Chief Complaint  Patient presents with   Dizziness   Discussed the use of AI scribe software for clinical note transcription with the patient, who gave verbal consent to proceed.  History of Present Illness   Franklin [name] is a 43 year old male with a history of seizures who presents with dizziness.  Franklin [name] experiences dizziness for over a year, with episodes occurring a couple of times a week. The dizziness is described as the room spinning and lasts about 45 minutes. It occurs unexpectedly, whether sitting or standing, without specific triggers. Meclizine  provides inconsistent relief. There is no associated nausea.  He has a history of seizures, with no recent episodes. He resumed Lamictal  after a seizure-like episode in May and has been compliant since. A CT scan in May showed a remote right medial orbital wall fracture from an old injury. He drinks about half a gallon of water daily and denies current dizziness. No recent imaging or lab work has been done since May.    Outpatient Encounter Medications as of 01/25/2024  Medication Sig   atorvastatin  (LIPITOR) 10 MG tablet Take 10 mg by mouth in the morning.   calcium  carbonate (TUMS - DOSED IN MG ELEMENTAL CALCIUM ) 500 MG chewable tablet Chew 2 tablets by mouth as needed for indigestion or heartburn.   guaiFENesin  (ROBITUSSIN) 100 MG/5ML liquid Take 5-10 mLs (100-200 mg total) by mouth every 4 (four) hours as needed for cough or to loosen phlegm.   lamoTRIgine  (LAMICTAL ) 25 MG tablet Take 1 tablet by mouth every night at bedtime.   methocarbamol  (ROBAXIN ) 500 MG tablet Take 1 tablet (500 mg total) by mouth 2 (two) times daily.   naproxen  (NAPROSYN ) 375 MG tablet Take 1 tablet (375 mg total) by mouth 2 (two) times daily.   ondansetron  (ZOFRAN -ODT) 4 MG disintegrating tablet Take 1 tablet (4 mg  total) by mouth every 8 (eight) hours as needed for nausea or vomiting.   [DISCONTINUED] meclizine  (ANTIVERT ) 25 MG tablet Take 1 tablet (25 mg total) by mouth 3 (three) times daily as needed for dizziness.   meclizine  (ANTIVERT ) 25 MG tablet Take 1 tablet (25 mg total) by mouth 3 (three) times daily as needed for dizziness.   [DISCONTINUED] diphenhydrAMINE  (BENADRYL ) 25 MG tablet Take 2 tablets (50 mg total) by mouth at bedtime as needed for up to 7 days.   [DISCONTINUED] loratadine  (CLARITIN ) 10 MG tablet Take 1 tablet (10 mg total) by mouth daily. (Patient not taking: Reported on 10/11/2019)   [DISCONTINUED] Oxcarbazepine  (TRILEPTAL ) 300 MG tablet Take 1/2 tablet twice a day (Patient not taking: Reported on 07/08/2019)   No facility-administered encounter medications on file as of 01/25/2024.    Past Medical History:  Diagnosis Date   Anxiety    Bipolar 1 disorder (HCC)    Chronic headaches    Conversion disorder with seizures or convulsions    Convulsion, non-epileptic (HCC)    raises the possibility of non-epileptic events per Neuro MD office note 03/2015   Depression    Dizziness    Hx of electroencephalogram 12/2014   normal   Insomnia    Mild cognitive impairment    Psychogenic nonepileptic seizure    Schizophrenia (HCC)    Schizophrenic disorder (HCC)    Seizure-like activity (HCC)    Seizures (HCC)  History reviewed. No pertinent surgical history.  Family History  Problem Relation Age of Onset   Kidney failure Mother    Heart disease Mother     Social History   Socioeconomic History   Marital status: Married    Spouse name: Angie   Number of children: 0   Years of education: Not on file   Highest education level: Not on file  Occupational History    Comment: disability part time at [employer]  Tobacco Use   Smoking status: Never   Smokeless tobacco: Never  Vaping Use   Vaping status: Never Used  Substance and Sexual Activity   Alcohol use:  Not Currently    Comment: special occassions   Drug use: Never   Sexual activity: Not on file  Other Topics Concern   Not on file  Social History Narrative   ** Merged History Encounter **       Lives with wife Sodas, 5 day   Social Drivers of Corporate investment banker Strain: Not on file  Food Insecurity: Food Insecurity Present (08/17/2023)   Hunger Vital Sign    Worried About Running Out of Food in the Last Year: Often true    Ran Out of Food in the Last Year: Often true  Transportation Needs: No Transportation Needs (08/17/2023)   PRAPARE - Administrator, Civil Service (Medical): No    Lack of Transportation (Non-Medical): No  Physical Activity: Not on file  Stress: Not on file  Social Connections: Not on file  Intimate Partner Violence: Not At Risk (08/17/2023)   Humiliation, Afraid, Rape, and Kick questionnaire    Fear of Current or Ex-Partner: No    Emotionally Abused: No    Physically Abused: No    Sexually Abused: No    Review of Systems  Constitutional: Negative.   HENT: Negative.    Eyes: Negative.   Respiratory:  Negative for shortness of breath.   Cardiovascular:  Negative for chest pain.  Gastrointestinal: Negative.   Genitourinary: Negative.   Musculoskeletal: Negative.   Skin: Negative.   Neurological:  Negative for dizziness, seizures, weakness and headaches.  Endo/Heme/Allergies: Negative.   Psychiatric/Behavioral: Negative.          Objective    BP 117/77 (BP Location: Left Arm, Patient Position: Sitting, Cuff Size: Normal)   Pulse 63   SpO2 96%   Physical Exam Vitals and nursing note reviewed.  Constitutional:      Appearance: Normal appearance.  HENT:     Head: Normocephalic and atraumatic.     Right Ear: Tympanic membrane, ear canal and external ear normal.     Left Ear: Tympanic membrane, ear canal and external ear normal.     Nose: Nose normal.     Mouth/Throat:     Mouth: Mucous membranes are moist.     Pharynx:  Oropharynx is clear.  Eyes:     Extraocular Movements: Extraocular movements intact.     Conjunctiva/sclera: Conjunctivae normal.     Pupils: Pupils are equal, round, and reactive to light.  Cardiovascular:     Rate and Rhythm: Normal rate and regular rhythm.     Pulses: Normal pulses.     Heart sounds: Normal heart sounds.  Pulmonary:     Effort: Pulmonary effort is normal.     Breath sounds: Normal breath sounds.  Musculoskeletal:        General: Normal range of motion.     Cervical back: Normal range  of motion and neck supple.  Skin:    General: Skin is warm and dry.  Neurological:     General: No focal deficit present.     Mental Status: He is alert and oriented to person, place, and time.     Cranial Nerves: No cranial nerve deficit.     Sensory: No sensory deficit.     Motor: No weakness.     Coordination: Coordination normal.  Psychiatric:        Mood and Affect: Mood normal.        Behavior: Behavior normal.        Thought Content: Thought content normal.        Judgment: Judgment normal.         Assessment & Plan:   Problem List Items Addressed This Visit   None Visit Diagnoses       Dizziness    -  Primary   Relevant Medications   meclizine  (ANTIVERT ) 25 MG tablet   Other Relevant Orders   CBC with Differential/Platelet   Comp. Metabolic Panel (12)   TSH     Screening, lipid       Relevant Orders   Lipid panel     Assessment and Plan Dizziness Chronic dizziness with episodes of vertigo. Meclizine  provides partial relief. - Order lab work including thyroid  function tests and cholesterol levels. - Refill meclizine  prescription. - Instruct him to track dizziness episodes, noting potential triggers such as sleep quality, stress level, and hydration. Follow-up with mobile unit in 2 weeks, red flags given for prompt reevaluation  Seizure disorder Seizure disorder managed with Lamictal . No recent seizures. Previous non-compliance led to seizure-like  activity. CT showed remote fracture, no acute findings. - Continue Lamictal  as prescribed.   I have reviewed the patient's medical history (PMH, PSH, Social History, Family History, Medications, and allergies) , and have been updated if relevant. I spent 30 minutes reviewing chart and  face to face time with patient.     Return in about 2 weeks (around 02/08/2024) for With [facility].   Kirk RAMAN Mayers, PA-C

## 2024-01-26 ENCOUNTER — Ambulatory Visit: Payer: Self-pay | Admitting: Physician Assistant

## 2024-01-26 DIAGNOSIS — E782 Mixed hyperlipidemia: Secondary | ICD-10-CM

## 2024-01-26 LAB — LIPID PANEL
Chol/HDL Ratio: 6.3 ratio — ABNORMAL HIGH (ref 0.0–5.0)
Cholesterol, Total: 251 mg/dL — ABNORMAL HIGH (ref 100–199)
HDL: 40 mg/dL (ref >39)
LDL Chol Calc (NIH): 193 mg/dL — ABNORMAL HIGH (ref 0–99)
Triglycerides: 99 mg/dL (ref 0–149)
VLDL Cholesterol Cal: 18 mg/dL (ref 5–40)

## 2024-01-26 LAB — COMP. METABOLIC PANEL (12)
AST: 16 IU/L (ref 0–40)
Albumin: 4.8 g/dL (ref 4.1–5.1)
Alkaline Phosphatase: 77 IU/L (ref 44–121)
BUN/Creatinine Ratio: 13 (ref 9–20)
BUN: 13 mg/dL (ref 6–24)
Bilirubin Total: 1 mg/dL (ref 0.0–1.2)
Calcium: 9.7 mg/dL (ref 8.7–10.2)
Chloride: 102 mmol/L (ref 96–106)
Creatinine, Ser: 1 mg/dL (ref 0.76–1.27)
Globulin, Total: 3.1 g/dL (ref 1.5–4.5)
Glucose: 64 mg/dL — ABNORMAL LOW (ref 70–99)
Potassium: 4.1 mmol/L (ref 3.5–5.2)
Sodium: 139 mmol/L (ref 134–144)
Total Protein: 7.9 g/dL (ref 6.0–8.5)
eGFR: 97 mL/min/1.73 (ref >59)

## 2024-01-26 LAB — CBC WITH DIFFERENTIAL/PLATELET
Basophils Absolute: 0 x10E3/uL (ref 0.0–0.2)
Basos: 1 %
EOS (ABSOLUTE): 0 x10E3/uL (ref 0.0–0.4)
Eos: 0 %
Hematocrit: 48 % (ref 37.5–51.0)
Hemoglobin: 15.8 g/dL (ref 13.0–17.7)
Immature Grans (Abs): 0 x10E3/uL (ref 0.0–0.1)
Immature Granulocytes: 0 %
Lymphocytes Absolute: 1.7 x10E3/uL (ref 0.7–3.1)
Lymphs: 34 %
MCH: 30.3 pg (ref 26.6–33.0)
MCHC: 32.9 g/dL (ref 31.5–35.7)
MCV: 92 fL (ref 79–97)
Monocytes Absolute: 0.3 x10E3/uL (ref 0.1–0.9)
Monocytes: 6 %
Neutrophils Absolute: 2.8 x10E3/uL (ref 1.4–7.0)
Neutrophils: 59 %
Platelets: 219 x10E3/uL (ref 150–450)
RBC: 5.21 x10E6/uL (ref 4.14–5.80)
RDW: 13.1 % (ref 11.6–15.4)
WBC: 4.8 x10E3/uL (ref 3.4–10.8)

## 2024-01-26 LAB — TSH: TSH: 1.93 u[IU]/mL (ref 0.450–4.500)

## 2024-01-26 MED ORDER — ATORVASTATIN CALCIUM 10 MG PO TABS: 10.0000 mg | ORAL_TABLET | Freq: Every morning | ORAL | 2 refills | Status: AC

## 2024-02-07 ENCOUNTER — Telehealth: Payer: Self-pay | Admitting: Physician Assistant

## 2024-02-07 NOTE — Telephone Encounter (Signed)
 Contacted pt to get him scheduled for follow up visit. Pt stated he will come in tomorrow but not sure what time.

## 2024-02-08 ENCOUNTER — Telehealth: Payer: Self-pay | Admitting: Physician Assistant

## 2024-02-08 ENCOUNTER — Ambulatory Visit: Admitting: Physician Assistant

## 2024-02-08 NOTE — Telephone Encounter (Signed)
 Contacted pt to see if he still wanted to be seen. Pt stated he had a family emergency and had to leave.    He is requesting a call with his test results. His plan is to also come back to be seen on 9/23.

## 2024-02-13 ENCOUNTER — Emergency Department (HOSPITAL_COMMUNITY)
Admission: EM | Admit: 2024-02-13 | Discharge: 2024-02-14 | Discharge status: Against Medical Advice | Attending: Emergency Medicine | Admitting: Emergency Medicine

## 2024-02-13 ENCOUNTER — Other Ambulatory Visit: Payer: Self-pay

## 2024-02-13 DIAGNOSIS — R55 Syncope and collapse: Secondary | ICD-10-CM | POA: Insufficient documentation

## 2024-02-13 DIAGNOSIS — Z5321 Procedure and treatment not carried out due to patient leaving prior to being seen by health care provider: Secondary | ICD-10-CM | POA: Insufficient documentation

## 2024-02-13 DIAGNOSIS — R42 Dizziness and giddiness: Secondary | ICD-10-CM | POA: Diagnosis present

## 2024-02-13 LAB — URINALYSIS, ROUTINE W REFLEX MICROSCOPIC
Bilirubin Urine: NEGATIVE
Glucose, UA: NEGATIVE mg/dL
Hgb urine dipstick: NEGATIVE
Ketones, ur: NEGATIVE mg/dL
Leukocytes,Ua: NEGATIVE
Nitrite: NEGATIVE
Protein, ur: NEGATIVE mg/dL
Specific Gravity, Urine: 1.006 (ref 1.005–1.030)
pH: 6 (ref 5.0–8.0)

## 2024-02-13 MED ORDER — MECLIZINE HCL 25 MG PO TABS
25.0000 mg | ORAL_TABLET | Freq: Once | ORAL | Status: AC
  Administered 2024-02-13: 25 mg via ORAL
  Filled 2024-02-13: qty 1

## 2024-02-13 NOTE — ED Triage Notes (Signed)
 Sudden onset of dizziness, fell on bottom (no head strike). Reports continued dizziness. Nothing makes it better or worse. No pain.  Thought his glucose was low and that's why he got dizzy but it was 125 with EMS

## 2024-02-13 NOTE — ED Provider Triage Note (Signed)
 Emergency Medicine Provider Triage Evaluation Note  Franklin [name] , a 43 y.o. male  was evaluated in triage.  Pt complains of vertigo and syncope.  Patient states that he had an episode of sudden onset dizziness that started around 15 minutes prior to arrival.  He has had a longstanding history of vertiginous symptoms.  He thought his blood glucose could be low and checked it and it was normal.  He went to grab a soda and had an episode of syncope.  Denies any chest pain or shortness of breath.  He endorses mild persistent room spinning dizziness, has been able to ambulate without an abnormal gait.  Denies any facial droop, numbness, weakness of any extremity.  Review of Systems  Positive: Vertigo, syncope Negative: Chest pain, shortness of breath, headache  Physical Exam  Ht 6' (1.829 m)   Wt 70.3 kg   BMI 21.02 kg/m  Gen:   Awake, no distress, GCS 15, ABC intact Resp:  Normal effort, lungs CTAB CV:  No m/r/g, well perfused MSK:   Moves extremities without difficulty Neuro:  Cranial nerves II through XII intact with the exception of leftward beating nystagmus, 5 out of 5 strength in all 4 extremities with intact sensation to light touch, no dysmetria bilaterally, ambulatory with a steady gait, no ataxia HINTS: Points to peripheral etiology, head impulse test with corrective saccade, unidirectional leftward beating nystagmus present, no skew deviation  Medical Decision Making  Medically screening exam initiated at 6:35 PM.  Appropriate orders placed.  Cara ONEIDA Barrio was informed that the remainder of the evaluation will be completed by another provider, this initial triage assessment does not replace that evaluation, and the importance of remaining in the ED until their evaluation is complete.  Patient presenting with syncopal episode after an episode of what appears to be peripheral vertigo, hints exam points to peripheral eye etiology, patient has longstanding history of vertigo, central  CVA unlikely.  Syncope workup initiated with EKG, chest x-ray and labs.  CBG 125 with EMS.  Meclizine  ordered.   Jerrol Agent, MD 02/13/24 [phone]

## 2024-02-13 NOTE — ED Notes (Signed)
Pt refusing IV and labs. Provider aware.

## 2024-02-13 NOTE — ED Notes (Signed)
 Called pt x3 no answer.[email or web address]

## 2024-03-06 ENCOUNTER — Encounter: Payer: Self-pay | Admitting: *Deleted

## 2024-03-06 NOTE — Progress Notes (Signed)
 Pt attended 08/17/23 screening event where his bp was 127/86 and blood glucose was 69. Pt documented [facility] as his PCP and did not document insurance. Pt noted he was not an active smoker and also documented SDOH need for food and housing. At screening event CMA gave pt SDOH resources.  During the initial event f/u, Health equity team member was able to confirm pt was seeing [facility] PCP, but last appt was actually in 2024. During 6 month f/u today, pt shared he wanted to establish care with another PCP who could get him in when he needed to be seen, on shorter notice than several weeks. He noted he no longer has any SDOH insecurities at this time an mainly focused on the need for a PCP who could see him as needed so that for non-emergent needs, he could get in to be seen soon, and wanted a PCP who he could get to by bus, and on days he did not work, which would usually be Tuesdays, Wednesdays, and Thursdays. This health equity team member was able to get pt a new pt appt at the [facility] location in 2 days, on Thursday, Oct. 9 at 11A- the office is on a bus route and the pt agreed he could come to this appt. Since appt is in 2 days, no letter sent, and pt called and given appt date, time, with address and phone number he could call as needed. No additional health equity team support scheduled at this time, past this current 6 month post event f/u.

## 2024-03-08 ENCOUNTER — Ambulatory Visit: Admitting: Family Medicine

## 2024-03-15 ENCOUNTER — Emergency Department (HOSPITAL_COMMUNITY): Admission: EM | Admit: 2024-03-15 | Discharge: 2024-03-15 | Disposition: A | Discharge status: Home / Self Care

## 2024-03-15 ENCOUNTER — Encounter (HOSPITAL_COMMUNITY): Payer: Self-pay | Admitting: Emergency Medicine

## 2024-03-15 ENCOUNTER — Emergency Department (HOSPITAL_COMMUNITY)

## 2024-03-15 ENCOUNTER — Other Ambulatory Visit: Payer: Self-pay

## 2024-03-15 DIAGNOSIS — Z91040 Latex allergy status: Secondary | ICD-10-CM | POA: Diagnosis not present

## 2024-03-15 DIAGNOSIS — R569 Unspecified convulsions: Secondary | ICD-10-CM | POA: Diagnosis present

## 2024-03-15 LAB — COMPREHENSIVE METABOLIC PANEL WITH GFR
ALT: 17 U/L (ref 0–44)
AST: 20 U/L (ref 15–41)
Albumin: 4.5 g/dL (ref 3.5–5.0)
Alkaline Phosphatase: 65 U/L (ref 38–126)
Anion gap: 10 (ref 5–15)
BUN: 11 mg/dL (ref 6–20)
CO2: 21 mmol/L — ABNORMAL LOW (ref 22–32)
Calcium: 9.3 mg/dL (ref 8.9–10.3)
Chloride: 105 mmol/L (ref 98–111)
Creatinine, Ser: 1 mg/dL (ref 0.61–1.24)
GFR, Estimated: >60 mL/min
Glucose, Bld: 81 mg/dL (ref 70–99)
Potassium: 3.6 mmol/L (ref 3.5–5.1)
Sodium: 136 mmol/L (ref 135–145)
Total Bilirubin: 1.4 mg/dL — ABNORMAL HIGH (ref 0.0–1.2)
Total Protein: 7.7 g/dL (ref 6.5–8.1)

## 2024-03-15 LAB — CBG MONITORING, ED
Glucose-Capillary: 83 mg/dL (ref 70–99)
Glucose-Capillary: 95 mg/dL (ref 70–99)

## 2024-03-15 LAB — CBC
HCT: 43.6 % (ref 39.0–52.0)
Hemoglobin: 15.1 g/dL (ref 13.0–17.0)
MCH: 29.9 pg (ref 26.0–34.0)
MCHC: 34.6 g/dL (ref 30.0–36.0)
MCV: 86.3 fL (ref 80.0–100.0)
Platelets: 203 K/uL (ref 150–400)
RBC: 5.05 MIL/uL (ref 4.22–5.81)
RDW: 12.2 % (ref 11.5–15.5)
WBC: 7.1 K/uL (ref 4.0–10.5)
nRBC: 0 % (ref 0.0–0.2)

## 2024-03-15 LAB — I-STAT CG4 LACTIC ACID, ED: Lactic Acid, Venous: 1.2 mmol/L (ref 0.5–1.9)

## 2024-03-15 MED ORDER — LAMOTRIGINE 25 MG PO TABS
25.0000 mg | ORAL_TABLET | Freq: Once | ORAL | Status: AC
  Administered 2024-03-15: 25 mg via ORAL
  Filled 2024-03-15: qty 1

## 2024-03-15 MED ORDER — LAMOTRIGINE 25 MG PO TABS: 25.0000 mg | ORAL_TABLET | Freq: Every day | ORAL | 0 refills | Status: AC

## 2024-03-15 NOTE — Discharge Instructions (Signed)
 Please take your Lamictal .  Follow-up with your primary doctors for refills.  Return show fevers, chills, severe headache, facial droop, chest pain, shortness of breath, abdominal pain or any new or worsening symptoms are concerning to you.
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
## 2024-03-15 NOTE — ED Notes (Signed)
 Pt awake and alert at this time, sitting up in stretcher, oriented X 4, brother at bedside.
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
## 2024-03-15 NOTE — ED Notes (Signed)
 Pt provided w/ d/c and f/u instructions, pt verbalized understanding. LDA removed, VSS, pt ambulatory out of ER w/ all belongings and family

## 2024-04-17 ENCOUNTER — Ambulatory Visit

## 2024-05-20 ENCOUNTER — Encounter (HOSPITAL_COMMUNITY): Payer: Self-pay

## 2024-05-20 ENCOUNTER — Emergency Department (HOSPITAL_COMMUNITY)
Admission: EM | Admit: 2024-05-20 | Discharge: 2024-05-21 | Disposition: A | Discharge status: Home / Self Care | Attending: Emergency Medicine | Admitting: Emergency Medicine

## 2024-05-20 ENCOUNTER — Other Ambulatory Visit: Payer: Self-pay

## 2024-05-20 DIAGNOSIS — R258 Other abnormal involuntary movements: Secondary | ICD-10-CM | POA: Insufficient documentation

## 2024-05-20 DIAGNOSIS — Z91040 Latex allergy status: Secondary | ICD-10-CM | POA: Insufficient documentation

## 2024-05-20 DIAGNOSIS — R519 Headache, unspecified: Secondary | ICD-10-CM | POA: Insufficient documentation

## 2024-05-20 DIAGNOSIS — R569 Unspecified convulsions: Secondary | ICD-10-CM
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
## 2024-05-20 NOTE — ED Triage Notes (Addendum)
 Patient BIB EMS from home c/o witnessed seizures for 5 mins. Per report patient was out of his seizures medications and unable to take it for 3 days. No report of N/V.   CBG 98 BP 131 85 HR 60 RR 16 O2sa5 99% on RA

## 2024-05-21 MED ORDER — KETOROLAC TROMETHAMINE 60 MG/2ML IM SOLN
30.0000 mg | Freq: Once | INTRAMUSCULAR | Status: DC
  Filled 2024-05-21: qty 2

## 2024-05-21 MED ORDER — ACETAMINOPHEN 500 MG PO TABS
1000.0000 mg | ORAL_TABLET | Freq: Once | ORAL | Status: AC
  Administered 2024-05-21: 1000 mg via ORAL
  Filled 2024-05-21: qty 2
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
## 2024-05-21 NOTE — ED Notes (Signed)
 PT axox4. GCS 15, PT verbalizes understanding of discharge instructions and follow up. PT ambulated out of er with steady gait to WR to await [place]

## 2024-06-19 ENCOUNTER — Telehealth: Payer: Self-pay

## 2024-06-19 NOTE — Telephone Encounter (Signed)
 Erroneous encounter. Please disregard. Copied from CRM #[id number]. Topic: Medical Record Request - Provider/Facility Request >> Jun 18, 2024  3:17 PM Ivette P wrote: Reason for CRM: [facility]ino called in about verifying chronic condition of pt. Will be facing attestation over to review and send back. Please verify and send back.

## 2024-06-25 ENCOUNTER — Emergency Department (HOSPITAL_BASED_OUTPATIENT_CLINIC_OR_DEPARTMENT_OTHER)
Admission: EM | Admit: 2024-06-25 | Discharge: 2024-06-25 | Disposition: A | Discharge status: Home / Self Care | Attending: Emergency Medicine | Admitting: Emergency Medicine

## 2024-06-25 ENCOUNTER — Other Ambulatory Visit: Payer: Self-pay

## 2024-06-25 ENCOUNTER — Encounter (HOSPITAL_BASED_OUTPATIENT_CLINIC_OR_DEPARTMENT_OTHER): Payer: Self-pay | Admitting: Emergency Medicine

## 2024-06-25 DIAGNOSIS — R059 Cough, unspecified: Secondary | ICD-10-CM | POA: Diagnosis present

## 2024-06-25 DIAGNOSIS — R051 Acute cough: Secondary | ICD-10-CM | POA: Diagnosis not present

## 2024-06-25 DIAGNOSIS — Z91040 Latex allergy status: Secondary | ICD-10-CM | POA: Diagnosis not present

## 2024-06-25 LAB — RESP PANEL BY RT-PCR (RSV, FLU A&B, COVID)  RVPGX2
Influenza A by PCR: NEGATIVE
Influenza B by PCR: NEGATIVE
Resp Syncytial Virus by PCR: NEGATIVE
SARS Coronavirus 2 by RT PCR: NEGATIVE

## 2024-06-25 MED ORDER — BENZONATATE 100 MG PO CAPS: 100.0000 mg | ORAL_CAPSULE | Freq: Three times a day (TID) | ORAL | 0 refills | Status: AC

## 2024-06-25 NOTE — ED Triage Notes (Signed)
 Patient here with a dry cough x3 days. Denies chest pain, shob, fevers, chills. Denies any productive cough. States it is just a dry cough. Has tried dayquil with no improvement.

## 2024-06-25 NOTE — ED Provider Notes (Signed)
 " [facility] AT [facility] Provider Note   CSN: [id number] Arrival date & time: 06/25/24  1550     Patient presents with: Cough   Franklin [name] is a 44 y.o. male here for evaluation of cough.  Started 3 days ago.  Cough nonproductive.  No fever, congestion, rhinorrhea, sore throat, chest pain, shortness of breath, lower extremity swelling, abdominal pain, nausea or vomiting.  Taking cough medicine over-the-counter without relief.  No PND orthopnea.  No history of PE or DVT   HPI     Prior to Admission medications  Medication Sig Start Date End Date Taking? Authorizing Provider  benzonatate  (TESSALON ) 100 MG capsule Take 1 capsule (100 mg total) by mouth every 8 (eight) hours. 06/25/24  Yes Margia Wiesen A, PA-C  atorvastatin  (LIPITOR) 10 MG tablet Take 1 tablet (10 mg total) by mouth in the morning. 01/26/24   Mayers, Cari S, PA-C  calcium  carbonate (TUMS - DOSED IN MG ELEMENTAL CALCIUM ) 500 MG chewable tablet Chew 2 tablets by mouth as needed for indigestion or heartburn.    [provider]  guaiFENesin  (ROBITUSSIN) 100 MG/5ML liquid Take 5-10 mLs (100-200 mg total) by mouth every 4 (four) hours as needed for cough or to loosen phlegm. 10/24/23   Vicky Charleston, PA-C  lamoTRIgine  (LAMICTAL ) 25 MG tablet Take 1 tablet (25 mg total) by mouth daily for 14 days. Take 1 tablet by mouth every night at bedtime. 03/15/24 03/29/24  Neysa Caron PARAS, DO  meclizine  (ANTIVERT ) 25 MG tablet Take 1 tablet (25 mg total) by mouth 3 (three) times daily as needed for dizziness. 01/25/24   Mayers, Cari S, PA-C  methocarbamol  (ROBAXIN ) 500 MG tablet Take 1 tablet (500 mg total) by mouth 2 (two) times daily. 08/18/23   Donnajean Lynwood DEL, PA-C  naproxen  (NAPROSYN ) 375 MG tablet Take 1 tablet (375 mg total) by mouth 2 (two) times daily. 09/17/23   Raspet, Erin K, PA-C  ondansetron  (ZOFRAN -ODT) 4 MG disintegrating tablet Take 1 tablet (4 mg total) by mouth every 8 (eight)  hours as needed for nausea or vomiting. 10/24/23   Vicky Charleston, PA-C  diphenhydrAMINE  (BENADRYL ) 25 MG tablet Take 2 tablets (50 mg total) by mouth at bedtime as needed for up to 7 days. 12/29/19 02/15/20  Darr, Jacob, PA-C  loratadine  (CLARITIN ) 10 MG tablet Take 1 tablet (10 mg total) by mouth daily. Patient not taking: Reported on 10/11/2019 09/18/19 12/02/19  Newlin, Enobong, MD  Oxcarbazepine  (TRILEPTAL ) 300 MG tablet Take 1/2 tablet twice a day Patient not taking: Reported on 07/08/2019 01/26/18 07/09/19  Danton Jon HERO, PA-C    Allergies: Coconut (cocos nucifera), Other, Codeine, Depakote  [divalproex  sodium], Hydroxyzine , Penicillins, Tape, Keflex  [cephalexin ], Latex, and Levetiracetam     Review of Systems  Constitutional: Negative.   HENT: Negative.    Respiratory:  Positive for cough. Negative for apnea, choking, chest tightness, shortness of breath, wheezing and stridor.   Cardiovascular: Negative.   Gastrointestinal: Negative.   Genitourinary: Negative.   Musculoskeletal: Negative.   Skin: Negative.   Neurological: Negative.   All other systems reviewed and are negative.   Updated Vital Signs BP (!) 131/92   Pulse 89   Temp 98.1 F (36.7 C)   Resp 18   Ht 6' (1.829 m)   Wt 70.3 kg   SpO2 100%   BMI 21.02 kg/m   Physical Exam Vitals and nursing note reviewed.  Constitutional:      General: He is not in acute  distress.    Appearance: He is well-developed. He is not ill-appearing, toxic-appearing or diaphoretic.  HENT:     Head: Normocephalic and atraumatic.     Nose: Nose normal.     Mouth/Throat:     Mouth: Mucous membranes are moist.  Eyes:     Pupils: Pupils are equal, round, and reactive to light.  Cardiovascular:     Rate and Rhythm: Normal rate and regular rhythm.     Pulses: Normal pulses.     Heart sounds: Normal heart sounds.  Pulmonary:     Effort: Pulmonary effort is normal. No respiratory distress.     Breath sounds: Normal breath sounds.   Abdominal:     General: Bowel sounds are normal. There is no distension.     Palpations: Abdomen is soft.     Tenderness: There is no abdominal tenderness. There is no right CVA tenderness, left CVA tenderness or guarding.  Musculoskeletal:        General: No swelling or tenderness. Normal range of motion.     Cervical back: Normal range of motion and neck supple.     Right lower leg: No edema.     Left lower leg: No edema.  Skin:    General: Skin is warm and dry.     Capillary Refill: Capillary refill takes less than 2 seconds.  Neurological:     General: No focal deficit present.     Mental Status: He is alert and oriented to person, place, and time.     Motor: No weakness.     Gait: Gait normal.    (all labs ordered are listed, but only abnormal results are displayed) Labs Reviewed  RESP PANEL BY RT-PCR (RSV, FLU A&B, COVID)  [id number]    EKG: None  Radiology: No results found.   Procedures   Medications Ordered in the ED - No data to display  44 year old here for evaluation of cough.  Nonproductive.  Started 3 days ago.  No associated fever, congestion, rhinorrhea, sore throat, chest pain, shortness of breath.  No pain or swelling to lower legs.  No PND orthopnea.  He is ambulatory here without any hypoxia.  Does not appear grossly fluid overloaded.  Wells criteria low risk.  No hemoptysis.  Lungs clear bilaterally.  Question viral illness.  Labs personally viewed and interpreted:  COVID, flu, RSV NEG  Discussed results with patient.  Low suspicion for acute ACS, PE, dissection, fluid overload, pneumonia, pneumothorax, Boerhaave rupture, obstruction, perforation, bacterial infectious process  Will have patient keep a close eye on his symptoms, return for new or worsening symptoms  The patient has been appropriately medically screened and/or stabilized in the ED. I have low suspicion for any other emergent medical condition which would require further screening,  evaluation or treatment in the ED or require inpatient management.  Patient is hemodynamically stable and in no acute distress.  Patient able to ambulate in department prior to ED.  Evaluation does not show acute pathology that would require ongoing or additional emergent interventions while in the emergency department or further inpatient treatment.  I have discussed the diagnosis with the patient and answered all questions.  Pain is been managed while in the emergency department and patient has no further complaints prior to discharge.  Patient is comfortable with plan discussed in room and is stable for discharge at this time.  I have discussed strict return precautions for returning to the emergency department.  Patient was encouraged to follow-up with PCP/specialist  refer to at discharge.                                      Medical Decision Making Amount and/or Complexity of Data Reviewed Independent Historian: friend Labs: ordered. Decision-making details documented in ED Course.  Risk OTC drugs. Prescription drug management. Decision regarding hospitalization. Diagnosis or treatment significantly limited by social determinants of health.        Final diagnoses:  Acute cough    ED Discharge Orders          Ordered    benzonatate  (TESSALON ) 100 MG capsule  Every 8 hours        06/25/24 1753               Morrell Fluke A, PA-C 06/25/24 1757  "

## 2024-06-25 NOTE — Discharge Instructions (Signed)
 Your COVID, flu, RSV test was negative  I have written you for cough medicine  Make sure to follow-up outpatient, return for new or worsening symptoms

## 2024-06-28 ENCOUNTER — Ambulatory Visit: Admitting: Family Medicine
